# Patient Record
Sex: Female | Born: 1970
Health system: Southern US, Community
[De-identification: ages and names within clinical notes are randomized; demographics above are authoritative.]

## PROBLEM LIST (undated history)

## (undated) ENCOUNTER — Emergency Department (HOSPITAL_BASED_OUTPATIENT_CLINIC_OR_DEPARTMENT_OTHER): Admission: EM | Payer: Medicare Other | Source: Home / Self Care

## (undated) DIAGNOSIS — D696 Thrombocytopenia, unspecified: Secondary | ICD-10-CM

## (undated) DIAGNOSIS — G43909 Migraine, unspecified, not intractable, without status migrainosus: Secondary | ICD-10-CM

## (undated) DIAGNOSIS — M1990 Unspecified osteoarthritis, unspecified site: Secondary | ICD-10-CM

## (undated) DIAGNOSIS — B029 Zoster without complications: Secondary | ICD-10-CM

## (undated) DIAGNOSIS — F32A Depression, unspecified: Secondary | ICD-10-CM

## (undated) DIAGNOSIS — R51 Headache: Secondary | ICD-10-CM

## (undated) DIAGNOSIS — M797 Fibromyalgia: Secondary | ICD-10-CM

## (undated) DIAGNOSIS — I639 Cerebral infarction, unspecified: Secondary | ICD-10-CM

## (undated) DIAGNOSIS — F329 Major depressive disorder, single episode, unspecified: Secondary | ICD-10-CM

## (undated) DIAGNOSIS — I10 Essential (primary) hypertension: Secondary | ICD-10-CM

## (undated) DIAGNOSIS — K219 Gastro-esophageal reflux disease without esophagitis: Secondary | ICD-10-CM

## (undated) DIAGNOSIS — B009 Herpesviral infection, unspecified: Secondary | ICD-10-CM

## (undated) DIAGNOSIS — D849 Immunodeficiency, unspecified: Secondary | ICD-10-CM

## (undated) HISTORY — DX: Fibromyalgia: M79.7

## (undated) HISTORY — PX: ABDOMINAL HYSTERECTOMY: SHX81

## (undated) HISTORY — DX: Unspecified osteoarthritis, unspecified site: M19.90

## (undated) HISTORY — DX: Zoster without complications: B02.9

## (undated) HISTORY — DX: Depression, unspecified: F32.A

## (undated) HISTORY — PX: UPPER GASTROINTESTINAL ENDOSCOPY: SHX188

## (undated) HISTORY — DX: Headache: R51

## (undated) HISTORY — DX: Herpesviral infection, unspecified: B00.9

## (undated) HISTORY — DX: Major depressive disorder, single episode, unspecified: F32.9

## (undated) HISTORY — DX: Thrombocytopenia, unspecified: D69.6

## (undated) HISTORY — DX: Migraine, unspecified, not intractable, without status migrainosus: G43.909

---

## 1993-10-31 HISTORY — PX: WISDOM TOOTH EXTRACTION: SHX21

## 2004-09-14 ENCOUNTER — Emergency Department: Payer: Self-pay | Admitting: Emergency Medicine

## 2004-09-16 ENCOUNTER — Ambulatory Visit: Payer: Self-pay | Admitting: Unknown Physician Specialty

## 2005-01-27 ENCOUNTER — Observation Stay: Payer: Self-pay

## 2005-02-10 ENCOUNTER — Observation Stay: Payer: Self-pay

## 2005-02-24 ENCOUNTER — Inpatient Hospital Stay: Payer: Self-pay

## 2006-12-14 ENCOUNTER — Inpatient Hospital Stay: Payer: Self-pay | Admitting: Unknown Physician Specialty

## 2007-05-24 ENCOUNTER — Ambulatory Visit: Payer: Self-pay | Admitting: Family Medicine

## 2007-05-24 DIAGNOSIS — E282 Polycystic ovarian syndrome: Secondary | ICD-10-CM

## 2007-05-24 DIAGNOSIS — L680 Hirsutism: Secondary | ICD-10-CM

## 2007-05-24 DIAGNOSIS — M129 Arthropathy, unspecified: Secondary | ICD-10-CM

## 2007-06-05 ENCOUNTER — Telehealth (INDEPENDENT_AMBULATORY_CARE_PROVIDER_SITE_OTHER): Payer: Self-pay | Admitting: *Deleted

## 2007-06-05 ENCOUNTER — Encounter: Payer: Self-pay | Admitting: Family Medicine

## 2007-06-13 ENCOUNTER — Encounter: Payer: Self-pay | Admitting: Family Medicine

## 2007-07-09 ENCOUNTER — Encounter: Payer: Self-pay | Admitting: Family Medicine

## 2007-07-11 ENCOUNTER — Encounter: Payer: Self-pay | Admitting: Family Medicine

## 2007-07-11 DIAGNOSIS — E049 Nontoxic goiter, unspecified: Secondary | ICD-10-CM | POA: Insufficient documentation

## 2007-07-25 ENCOUNTER — Ambulatory Visit: Payer: Self-pay | Admitting: Family Medicine

## 2007-07-26 LAB — CONVERTED CEMR LAB
ALT: 12 units/L (ref 0–35)
AST: 16 units/L (ref 0–37)
Bilirubin, Direct: 0.1 mg/dL (ref 0.0–0.3)
CO2: 30 meq/L (ref 19–32)
Calcium: 8.7 mg/dL (ref 8.4–10.5)
Chloride: 109 meq/L (ref 96–112)
Creatinine, Ser: 1 mg/dL (ref 0.4–1.2)
Glucose, Bld: 108 mg/dL — ABNORMAL HIGH (ref 70–99)
LDL Cholesterol: 54 mg/dL (ref 0–99)
Sodium: 142 meq/L (ref 135–145)
TSH: 0.61 microintl units/mL (ref 0.35–5.50)
Total Bilirubin: 0.6 mg/dL (ref 0.3–1.2)
Total CHOL/HDL Ratio: 2.5
Total Protein: 6.6 g/dL (ref 6.0–8.3)
Triglycerides: 31 mg/dL (ref 0–149)
VLDL: 6 mg/dL (ref 0–40)

## 2007-08-01 ENCOUNTER — Ambulatory Visit: Payer: Self-pay | Admitting: Family Medicine

## 2007-08-01 DIAGNOSIS — R4589 Other symptoms and signs involving emotional state: Secondary | ICD-10-CM

## 2007-08-01 DIAGNOSIS — I10 Essential (primary) hypertension: Secondary | ICD-10-CM | POA: Insufficient documentation

## 2007-08-01 DIAGNOSIS — R7309 Other abnormal glucose: Secondary | ICD-10-CM

## 2007-08-10 ENCOUNTER — Encounter: Payer: Self-pay | Admitting: Family Medicine

## 2007-08-20 ENCOUNTER — Encounter: Payer: Self-pay | Admitting: Family Medicine

## 2007-10-08 ENCOUNTER — Encounter: Payer: Self-pay | Admitting: Family Medicine

## 2007-10-16 ENCOUNTER — Ambulatory Visit: Payer: Self-pay | Admitting: Family Medicine

## 2007-10-16 DIAGNOSIS — M545 Low back pain: Secondary | ICD-10-CM | POA: Insufficient documentation

## 2007-11-26 ENCOUNTER — Ambulatory Visit: Payer: Self-pay | Admitting: Family Medicine

## 2007-11-26 LAB — CONVERTED CEMR LAB
Glucose, Urine, Semiquant: NEGATIVE
Ketones, urine, test strip: NEGATIVE
Nitrite: NEGATIVE
pH: 6

## 2007-11-27 ENCOUNTER — Encounter: Payer: Self-pay | Admitting: Family Medicine

## 2007-12-10 ENCOUNTER — Ambulatory Visit: Payer: Self-pay | Admitting: Family Medicine

## 2007-12-10 LAB — CONVERTED CEMR LAB
Ketones, urine, test strip: NEGATIVE
Protein, U semiquant: 30
Urobilinogen, UA: NEGATIVE

## 2007-12-12 ENCOUNTER — Encounter: Payer: Self-pay | Admitting: Family Medicine

## 2008-01-08 ENCOUNTER — Emergency Department: Payer: Self-pay | Admitting: Emergency Medicine

## 2008-01-17 ENCOUNTER — Ambulatory Visit: Payer: Self-pay

## 2008-01-25 ENCOUNTER — Ambulatory Visit: Payer: Self-pay | Admitting: Family Medicine

## 2008-01-25 DIAGNOSIS — R61 Generalized hyperhidrosis: Secondary | ICD-10-CM | POA: Insufficient documentation

## 2008-02-22 ENCOUNTER — Encounter: Payer: Self-pay | Admitting: Family Medicine

## 2008-02-28 ENCOUNTER — Telehealth: Payer: Self-pay | Admitting: Family Medicine

## 2008-06-26 ENCOUNTER — Telehealth (INDEPENDENT_AMBULATORY_CARE_PROVIDER_SITE_OTHER): Payer: Self-pay | Admitting: *Deleted

## 2008-06-26 ENCOUNTER — Emergency Department (HOSPITAL_COMMUNITY): Admission: EM | Admit: 2008-06-26 | Discharge: 2008-06-26 | Payer: Self-pay | Admitting: Emergency Medicine

## 2008-06-26 ENCOUNTER — Encounter: Payer: Self-pay | Admitting: Family Medicine

## 2008-06-30 ENCOUNTER — Ambulatory Visit: Payer: Self-pay | Admitting: Family Medicine

## 2008-06-30 DIAGNOSIS — F4323 Adjustment disorder with mixed anxiety and depressed mood: Secondary | ICD-10-CM

## 2008-07-16 ENCOUNTER — Encounter (INDEPENDENT_AMBULATORY_CARE_PROVIDER_SITE_OTHER): Payer: Self-pay | Admitting: *Deleted

## 2008-08-19 ENCOUNTER — Ambulatory Visit: Payer: Self-pay | Admitting: Family Medicine

## 2008-08-27 ENCOUNTER — Encounter (INDEPENDENT_AMBULATORY_CARE_PROVIDER_SITE_OTHER): Payer: Self-pay | Admitting: Internal Medicine

## 2008-08-29 ENCOUNTER — Ambulatory Visit: Payer: Self-pay | Admitting: Family Medicine

## 2008-12-01 ENCOUNTER — Telehealth: Payer: Self-pay | Admitting: Family Medicine

## 2008-12-02 ENCOUNTER — Telehealth: Payer: Self-pay | Admitting: Family Medicine

## 2008-12-02 ENCOUNTER — Ambulatory Visit: Payer: Self-pay | Admitting: Family Medicine

## 2008-12-04 ENCOUNTER — Encounter: Admission: RE | Admit: 2008-12-04 | Discharge: 2008-12-04 | Payer: Self-pay | Admitting: Family Medicine

## 2008-12-08 ENCOUNTER — Telehealth: Payer: Self-pay | Admitting: Family Medicine

## 2008-12-08 ENCOUNTER — Encounter: Payer: Self-pay | Admitting: Family Medicine

## 2008-12-08 ENCOUNTER — Encounter (INDEPENDENT_AMBULATORY_CARE_PROVIDER_SITE_OTHER): Payer: Self-pay | Admitting: *Deleted

## 2008-12-09 ENCOUNTER — Telehealth (INDEPENDENT_AMBULATORY_CARE_PROVIDER_SITE_OTHER): Payer: Self-pay | Admitting: *Deleted

## 2009-02-24 ENCOUNTER — Telehealth: Payer: Self-pay | Admitting: Family Medicine

## 2009-04-29 ENCOUNTER — Emergency Department: Payer: Self-pay | Admitting: Emergency Medicine

## 2009-07-13 ENCOUNTER — Telehealth: Payer: Self-pay | Admitting: Family Medicine

## 2009-10-27 ENCOUNTER — Ambulatory Visit: Payer: Self-pay | Admitting: Family Medicine

## 2009-10-28 ENCOUNTER — Ambulatory Visit: Payer: Self-pay | Admitting: Family Medicine

## 2010-07-04 ENCOUNTER — Emergency Department (HOSPITAL_COMMUNITY): Admission: EM | Admit: 2010-07-04 | Discharge: 2010-07-04 | Payer: Self-pay | Admitting: Emergency Medicine

## 2010-10-07 ENCOUNTER — Telehealth: Payer: Self-pay | Admitting: Internal Medicine

## 2010-10-07 ENCOUNTER — Ambulatory Visit: Payer: Self-pay | Admitting: Internal Medicine

## 2010-10-08 ENCOUNTER — Encounter: Payer: Self-pay | Admitting: Internal Medicine

## 2010-10-31 HISTORY — PX: PARTIAL HYSTERECTOMY: SHX80

## 2010-11-30 NOTE — Assessment & Plan Note (Signed)
Summary: LARYNGITIS/ 2:00   lb   Vital Signs:  Patient profile:   40 year old female Weight:      250 pounds BMI:     45.89 Temp:     98.7 degrees F oral Pulse rate:   76 / minute Pulse rhythm:   regular Resp:     16 per minute BP sitting:   120 / 80  (left arm) Cuff size:   large  Vitals Entered By: Mervin Hack CMA Duncan Dull) (October 07, 2010 2:16 PM) CC: sore throat, headache, can't talk   History of Present Illness: "I feel terrible" Headache, nasal congestion and discharge some better yesterday than earilier in the week Throat pain--largely from PND Chest pain from coughing---mostly productive at night (thick yellow green mucus) Voice gone since yesterday  Fever only at night has had chills some queasiness in stomach Not really SOB  Allergies: No Known Drug Allergies  Past History:  Past medical, surgical, family and social histories (including risk factors) reviewed for relevance to current acute and chronic problems.  Past Medical History: Reviewed history from 05/24/2007 and no changes required. 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 C-S miscarriage  Family History: Reviewed history from 05/24/2007 and no changes required. OA in family- no known rheum father with DM,  ? PCOS in family P aunts with breast ca GMP colon cancer grandparents, aunts uncles P- CVA  Social History: Reviewed history from 10/27/2009 and no changes required. has had some college works in collections works at [employer] is married 3 kids (9,2,87mo) non smoker on leave from work for depresion 12/10  Review of Systems       No diarrhea appetite is off  Physical Exam  General:  alert.  NAD but voice is hoarse Head:  no frontal or maxillary tenderness but does have occipital tenderness Ears:  R ear normal and L ear normal.   Nose:  marked inflammation and thick green mucus  in right nare Mouth:  no erythema and no exudates.   Neck:  supple, no masses, and no cervical lymphadenopathy.   Lungs:  normal respiratory effort, no intercostal retractions, and no accessory muscle use.  Muffled breath sounds at bases but clear   Impression & Recommendations:  Problem # 1:  SINUSITIS - ACUTE-NOS (ICD-461.9) Assessment New  seems to have bacterial infection with night fevers and chills and purulent mucus duration  ~4 days though  will try analgesic amoxicillin  Her updated medication list for this problem includes:    Amoxicillin 500 Mg Tabs (Amoxicillin) .Marland Kitchen... 2 tabs by mouth two times a day for sinus infection  Complete Medication List: 1)  Mobic 15 Mg Tabs (Meloxicam) .... Take 1 by mouth once daily as needed 2)  Amoxicillin 500 Mg Tabs (Amoxicillin) .... 2 tabs by mouth two times a day for sinus infection  Patient Instructions: 1)  Please schedule a follow-up appointment as needed .  2)  Please call Monday if not really feeling considerably better Prescriptions: AMOXICILLIN 500 MG TABS (AMOXICILLIN) 2 tabs by mouth two times a day for sinus infection  #40 x 0   Entered and Authorized by:   Cindee Salt MD   Signed by:   Cindee Salt MD on 10/07/2010   Method used:   Electronically to        [facility]* (retail)       [address]  [place], [place]  [address]       Ph: [phone]       Fax: [phone]   RxID:   [id number]    Orders Added: 1)  Est. Patient Level III [44010]    Current Allergies (reviewed today): No known allergies

## 2010-11-30 NOTE — Letter (Signed)
Summary: Out of Work  [facility]  [address] [place], [place] [address]   Phone: [phone]  Fax: [phone]    October 08, 2010   Employee:  Carmina Miller    To Whom It May Concern:   For Medical reasons, please excuse the above named employee from work for the following dates:  Start:   10/07/2010  End:   10/11/2010  If you need additional information, please feel free to contact our office.         Sincerely,      Tillman Abide, MD

## 2010-11-30 NOTE — Progress Notes (Signed)
Summary: needs note for work  Phone Note Call from Patient Call back at Work Phone [phone]   Caller: Patient Call For: Dr. Alphonsus Sias Summary of Call: Pt just left after being seen.  She is asking if she should work tomorrow or stay home.  If she shouldnt work she needs to come by and pickup a note. Initial call taken by: Lowella Petties CMA, AAMA,  October 07, 2010 3:01 PM  Follow-up for Phone Call        I generally recommend going to work unless you really feel bad (esp with a new job) If she is not up to it, okay to give excuse for tomorrow and she can pick  it up then Follow-up by: Cindee Salt MD,  October 07, 2010 5:23 PM  Additional Follow-up for Phone Call Additional follow up Details #1::        Spoke with patient and advised results. She will let us know how it goes in the morning.  Additional Follow-up by: Mervin Hack CMA Duncan Dull),  October 07, 2010 5:40 PM

## 2011-01-03 ENCOUNTER — Encounter: Payer: Self-pay | Admitting: Family Medicine

## 2011-01-03 ENCOUNTER — Ambulatory Visit (INDEPENDENT_AMBULATORY_CARE_PROVIDER_SITE_OTHER): Payer: 59 | Admitting: Family Medicine

## 2011-01-03 DIAGNOSIS — R05 Cough: Secondary | ICD-10-CM

## 2011-01-11 NOTE — Letter (Signed)
Summary: Out of Work  [facility] at [facility]  [address] [place], [place] [address]   Phone: [phone]  Fax: [phone]    January 03, 2011   Employee:  Carmina Miller    To Whom It May Concern:   For Medical reasons, please excuse the above named employee from work for the following dates:  Start:   today  End:   tomorrow, go back to work on 01/05/11  If you need additional information, please feel free to contact our office.         Sincerely,    Crawford Givens MD

## 2011-01-11 NOTE — Assessment & Plan Note (Signed)
Summary: cough and congestion jrt   Vital Signs:  Patient profile:   40 year old female Height:      62 inches Weight:      240.25 pounds BMI:     44.10 O2 Sat:      97 % on Room air Temp:     98.6 degrees F oral Pulse rate:   74 / minute Pulse rhythm:   regular Resp:     16 per minute BP sitting:   128 / 76  (left arm) Cuff size:   large  Vitals Entered By: Delilah Shan CMA (AAMA) (January 03, 2011 11:00 AM)  O2 Flow:  Room air CC: Cough and congestion   History of Present Illness: Cough for about 1-2 weeks.  Woke up Saturday with pain on R side of face.  She thought it was some tender lymph nodes.  She took some left over amoxil (6 pills).  cough with deep breath.  cough leads to HA, stress incontinence.  She is doing kegels already.  No FNAV.  Occ chills.  Occ wheeze.  No asthma.  Smokes occ.  ST resolved.   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  Otherwise negative.    Physical Exam  General:  GEN: nad, alert and oriented HEENT: mucous membranes moist, TM w/o erythema, nasal epithelium injected, OP with cobblestoning, no erythema NECK: supple w/o LA CV: rrr. PULM: ctab, no inc wob, cough noted ABD: soft, +bs EXT: no edema    Impression & Recommendations:  Problem # 1:  COUGH (ICD-786.2) Strep unlikely with cough, no fever, no LA today and no exudated.  ST is minimal now.  I doubt the amoxil was of any benefit.  Use SABA for likely viral process and follow up as needed.  D/w patient about smoking. She agrees.   Orders: Prescription Created Electronically [phone])  Complete Medication List: 1)  Mobic 15 Mg Tabs (Meloxicam) .... Take 1 by mouth once daily as needed 2)  Proair Hfa 108 (90 Base) Mcg/act Aers (Albuterol sulfate) .... 2 puffs every 4 hours as needed for  cough  Patient Instructions: 1)  Get plenty of rest, drink lots of clear liquids, and use Tylenol or Ibuprofen for fever and comfort. Use the inhaler as needed.  This should gradually improve.  Take care.  Prescriptions: PROAIR HFA 108 (90 BASE) MCG/ACT AERS (ALBUTEROL SULFATE) 2 puffs every 4 hours as needed for cough  #1 x 1   Entered and Authorized by:   Crawford Givens MD   Signed by:   Crawford Givens MD on 01/03/2011   Method used:   Electronically to        [facility]* (retail)       [address]       [place], [place]  [address]       Ph: [phone]       Fax: [phone]   RxID:   [id number]    Orders Added: 1)  Prescription Created Electronically [G8553] 2)  Est. Patient Level III [44010]    Current Allergies (reviewed today): No known allergies

## 2011-01-12 ENCOUNTER — Telehealth: Payer: Self-pay | Admitting: Family Medicine

## 2011-01-18 NOTE — Progress Notes (Signed)
Summary: cough, wheezing  Phone Note Call from Patient Call back at Work Phone [phone]   Caller: Patient Call For: Judith Part MD Summary of Call: Patient says that she is still having trouble coughing, loses her breath when she has the coughing fits, is having some wheezing, it is worse at night while lying down. She says that the proair is not helping and is asking to try something else. Uses [facility].  Initial call taken by: Melody Comas,  January 12, 2011 1:49 PM  Follow-up for Phone Call        please call in the hycodan.  sedation caution.  if she isn't improved with that, then she needs follow up.  Crawford Givens MD  January 12, 2011 2:02 PM   Additional Follow-up for Phone Call Additional follow up Details #1::        Patient Advised. Medication phoned to pharmacy.  Additional Follow-up by: Delilah Shan CMA (AAMA),  January 12, 2011 3:00 PM    New/Updated Medications: HYDROCODONE-HOMATROPINE 5-1.5 MG/5ML SYRP (HYDROCODONE-HOMATROPINE) 5ml by mouth at bedtime as needed for cough, sedation caution Prescriptions: HYDROCODONE-HOMATROPINE 5-1.5 MG/5ML SYRP (HYDROCODONE-HOMATROPINE) 5ml by mouth at bedtime as needed for cough, sedation caution  #6oz x 0   Entered and Authorized by:   Crawford Givens MD   Signed by:   Crawford Givens MD on 01/12/2011   Method used:   Telephoned to ...       [facility]* (retail)       [address]       [place], [place]  [address]       Ph: [phone]       Fax: [phone]   RxID:   [id number]

## 2011-03-08 ENCOUNTER — Emergency Department (HOSPITAL_COMMUNITY)
Admission: EM | Admit: 2011-03-08 | Discharge: 2011-03-08 | Disposition: A | Discharge status: Home / Self Care | Payer: 59 | Attending: Emergency Medicine | Admitting: Emergency Medicine

## 2011-03-08 ENCOUNTER — Emergency Department (HOSPITAL_COMMUNITY): Payer: 59

## 2011-03-08 DIAGNOSIS — R079 Chest pain, unspecified: Secondary | ICD-10-CM | POA: Insufficient documentation

## 2011-03-08 DIAGNOSIS — R109 Unspecified abdominal pain: Secondary | ICD-10-CM | POA: Insufficient documentation

## 2011-03-08 DIAGNOSIS — R630 Anorexia: Secondary | ICD-10-CM | POA: Insufficient documentation

## 2011-03-08 DIAGNOSIS — R319 Hematuria, unspecified: Secondary | ICD-10-CM | POA: Insufficient documentation

## 2011-03-08 DIAGNOSIS — R12 Heartburn: Secondary | ICD-10-CM | POA: Insufficient documentation

## 2011-03-08 DIAGNOSIS — M129 Arthropathy, unspecified: Secondary | ICD-10-CM | POA: Insufficient documentation

## 2011-03-08 DIAGNOSIS — R110 Nausea: Secondary | ICD-10-CM | POA: Insufficient documentation

## 2011-03-08 LAB — CBC
Hemoglobin: 13.8 g/dL (ref 12.0–15.0)
MCH: 31.1 pg (ref 26.0–34.0)
MCV: 91.4 fL (ref 78.0–100.0)
RBC: 4.44 MIL/uL (ref 3.87–5.11)

## 2011-03-08 LAB — DIFFERENTIAL
Lymphs Abs: 2.3 10*3/uL (ref 0.7–4.0)
Monocytes Relative: 5 % (ref 3–12)
Neutro Abs: 4.1 10*3/uL (ref 1.7–7.7)
Neutrophils Relative %: 58 % (ref 43–77)

## 2011-03-08 LAB — COMPREHENSIVE METABOLIC PANEL
BUN: 14 mg/dL (ref 6–23)
CO2: 28 mEq/L (ref 19–32)
Chloride: 105 mEq/L (ref 96–112)
Creatinine, Ser: 1.01 mg/dL (ref 0.4–1.2)
GFR calc non Af Amer: >60 mL/min (ref >60)
Total Bilirubin: 0.2 mg/dL — ABNORMAL LOW (ref 0.3–1.2)

## 2011-03-08 LAB — URINALYSIS, ROUTINE W REFLEX MICROSCOPIC
Protein, ur: NEGATIVE mg/dL
Urobilinogen, UA: 1 mg/dL (ref 0.0–1.0)

## 2011-03-08 LAB — URINE MICROSCOPIC-ADD ON

## 2011-05-26 ENCOUNTER — Ambulatory Visit: Payer: Self-pay

## 2011-06-09 ENCOUNTER — Ambulatory Visit: Payer: Self-pay

## 2011-06-13 LAB — PATHOLOGY REPORT

## 2011-08-11 ENCOUNTER — Ambulatory Visit (INDEPENDENT_AMBULATORY_CARE_PROVIDER_SITE_OTHER): Payer: 59 | Admitting: Family Medicine

## 2011-08-11 ENCOUNTER — Encounter: Payer: Self-pay | Admitting: Family Medicine

## 2011-08-11 VITALS — BP 118/82 | HR 76 | Temp 98.6°F | Wt 233.8 lb

## 2011-08-11 DIAGNOSIS — H1045 Other chronic allergic conjunctivitis: Secondary | ICD-10-CM

## 2011-08-11 DIAGNOSIS — H1010 Acute atopic conjunctivitis, unspecified eye: Secondary | ICD-10-CM | POA: Insufficient documentation

## 2011-08-11 MED ORDER — OLOPATADINE HCL 0.2 % OP SOLN: 1.0000 [drp] | Freq: Two times a day (BID) | OPHTHALMIC | Status: DC

## 2011-08-11 NOTE — Progress Notes (Signed)
  Subjective:    Patient ID: Dana Greer, female    DOB: 05-31-1971, 40 y.o.   MRN: [id number]  HPI  40 yo female pt of Dr. Milinda Antis here for swollen, itchy, watery eyes for several weeks. Does have a h/o seasonal allergies in spring, not usually this time of year.    Tried some erythromycin ointment she had at home, made burning worse.  Some sensitivity to light, mild photophobia. No eye pain.   Had URI symptoms for almost two months, self treated with Mucinex, those are improving.    Patient Active Problem List  Diagnoses  . GOITER NOS  . POLYCYSTIC OVARIAN DISEASE  . REACTION, ACUTE STRESS W/EMOTIONAL DSTURB  . ADJ DISORDER WITH MIXED ANXIETY & DEPRESSED MOOD  . HIRSUTISM  . ARTHROPATHY NOS, MULTIPLE SITES  . BACK PAIN, LUMBAR, CHRONIC  . HYPERHIDROSIS  . HYPERGLYCEMIA  . ELEVATED BLOOD PRESSURE WITHOUT DIAGNOSIS OF HYPERTENSION   Past Medical History  Diagnosis Date  . Arthritis   . Depression   . Headache   . Western blot positive HSV2    Past Surgical History  Procedure Date  . Cesarean section    History  Substance Use Topics  . Smoking status: Never Smoker   . Smokeless tobacco: Not on file  . Alcohol Use:    Family History  Problem Relation Age of Onset  . Diabetes Father   . Cancer Paternal Aunt     breast   No Known Allergies No current outpatient prescriptions on file prior to visit.   The PMH, PSH, Social History, Family History, Medications, and allergies have been reviewed in [facility], and have been updated if relevant.  Review of Systems See HPI    Objective:   Physical Exam BP 118/82  Pulse 76  Temp(Src) 98.6 F (37 C) (Oral)  Wt 233 lb 12 oz (106.028 kg)  General:  Well-developed,well-nourished,in no acute distress; alert,appropriate and cooperative throughout examination Head:  normocephalic and atraumatic.   Eyes: mild conjunctival injection, PERRL, +photophobia vision grossly intact, pupils equal, pupils round, and pupils reactive to  light.   Ears:  R ear normal and L ear normal.   Nose:  no external deformity.   Mouth:  good dentition.   Lungs:  Normal respiratory effort, chest expands symmetrically. Lungs are clear to auscultation, no crackles or wheezes. Heart:  Normal rate and regular rhythm. S1 and S2 normal without gallop, murmur, click, rub or other extra sounds. Msk:  No deformity or scoliosis noted of thoracic or lumbar spine.   Extremities:  No clubbing, cyanosis, edema, or deformity noted with normal full range of motion of all joints.   Neurologic:  alert & oriented X3 and gait normal.   Skin:  Intact without suspicious lesions or rashes Cervical Nodes:  No lymphadenopathy noted Psych:  Cognition and judgment appear intact. Alert and cooperative with normal attention span and concentration. No apparent delusions, illusions, hallucinations        Assessment & Plan:   1. Allergic conjunctivitis    New.   Will treat with Pataday- supportive care as per pt instructions. If no improvement in 2-3 days, needs to see optho.  Pt in agreement with plan.

## 2011-08-11 NOTE — Patient Instructions (Signed)
Allergic Conjunctivitis The conjunctiva is a thin membrane that covers the visible white part of the eyeball and the underside of the eyelids. This membrane protects and lubricates the eye. The membrane has small blood vessels running through it that can normally be seen. When the conjunctiva becomes inflamed, the condition is called conjunctivitis. In response to the inflammation, the conjunctival blood vessels become swollen. The swelling results in redness in the normally white part of the eye. The blood vessels of this membrane also react when a person has allergies and is then called allergic conjunctivitis. This condition usually lasts for as long as the allergy persists. Allergic conjunctivitis cannot be passed to another person (non-contagious). The likelihood of bacterial infection is great and the cause is not likely due to allergies if the inflamed eye has:  A sticky discharge.  Discharge or sticking together of the lids in the morning.   Scaling or flaking of the eyelids where the eyelashes come out.   Red swollen eyelids.   CAUSES  Germs (bacteria).   Viruses.   Irritants such as foreign bodies.   Blunt injury.   Chemicals.   General allergic reactions.   Inflammation or serious diseases in the inside or the outside of the eye or the orbit (the boney cavity in which the eye sits) can cause a "red eye."  SYMPTOMS  Eye redness.   Tearing.   Itchy eyes.   Burning feeling in the eyes.   Clear drainage from the eye.   Allergic reaction due to pollens or ragweed sensitivity. Seasonal allergic conjunctivitis is frequent in the spring when pollens are in the air and in the fall.  DIAGNOSIS This condition, in its many forms, is usually diagnosed based on the history and an ophthalmological exam. It usually involves both eyes. If your eyes react at the same time every year, allergies may be the cause. While most "red eyes" are due to allergy or an infection, the role of an  eye (ophthalmological) exam is important. The exam can rule out serious diseases of the eye or orbit. TREATMENT  Non-antibiotic eye drops, ointments, or medications by mouth may be prescribed if the ophthalmologist is sure the conjunctivitis is due to allergies alone.   Over-the-counter drops and ointments for allergic symptoms should be used only after other causes of conjunctivitis have been ruled out, or as your caregiver suggests.  Medications by mouth are often prescribed if other allergy-related symptoms are present. If the ophthalmologist is sure that the conjunctivitis is due to allergies alone, treatment is normally limited to drops or ointments to reduce itching and burning. HOME CARE INSTRUCTIONS  Wash hands before and after applying drops or ointments, or touching the inflamed eye(s) or eyelids.   Stop using your soft contact lenses and throw them away. Use a new pair of lenses when recovery is complete. You should run through sterilizing cycles at least three times before use after complete recovery if the old soft contact lenses are to be used. Hard contact lenses should be stopped. They need to be thoroughly sterilized before use after recovery.   Do not let the eye dropper tip or ointment tube touch the eyelid when putting medicine in your eye.   Itching and burning eyes due to allergies is often relieved by using a cool cloth applied to closed eye(s).  SEEK MEDICAL CARE IF:   Your problems do not go away after two or three days of treatment.   Your lids are sticky (especially in  the morning when you wake up) or stick together.   Discharge develops. Antibiotics may be needed either as drops, ointment, or by mouth.   You have extreme light sensitivity.   An oral temperature above 100.5 develops.   Pain in or around the eye or any other visual symptom develops.  MAKE SURE YOU:   Understand these instructions.   Will watch your condition.   Will get help right away if  you are not doing well or get worse.  Document Released: 01/07/2003 Document Re-Released: 04/06/2010 [facility] Patient Information 2011 [place], [place].

## 2011-08-15 ENCOUNTER — Ambulatory Visit: Payer: 59 | Admitting: Family Medicine

## 2011-08-19 ENCOUNTER — Encounter: Payer: Self-pay | Admitting: Family Medicine

## 2011-08-22 ENCOUNTER — Ambulatory Visit: Payer: 59 | Admitting: Family Medicine

## 2011-10-14 ENCOUNTER — Emergency Department: Payer: Self-pay | Admitting: Unknown Physician Specialty

## 2011-10-31 ENCOUNTER — Ambulatory Visit (INDEPENDENT_AMBULATORY_CARE_PROVIDER_SITE_OTHER): Payer: 59 | Admitting: Family Medicine

## 2011-10-31 ENCOUNTER — Encounter: Payer: Self-pay | Admitting: Family Medicine

## 2011-10-31 VITALS — BP 114/78 | HR 68 | Temp 98.5°F | Wt 226.5 lb

## 2011-10-31 DIAGNOSIS — J329 Chronic sinusitis, unspecified: Secondary | ICD-10-CM

## 2011-10-31 MED ORDER — AMOXICILLIN-POT CLAVULANATE 875-125 MG PO TABS: 1.0000 | ORAL_TABLET | Freq: Two times a day (BID) | ORAL | Status: AC

## 2011-10-31 NOTE — Assessment & Plan Note (Signed)
Acute after uri with sinus pain and purulent drainage  Will cover with augmentin Disc symptomatic care - see instructions on AVS

## 2011-10-31 NOTE — Patient Instructions (Signed)
Drink lots of fluids Use warm compress on face  Nasal saline spray or netti pot is helpful  advil cold and sinus is ok if not taking mobic  Take the augmentin as directed  Good luck with your surgery !

## 2011-10-31 NOTE — Progress Notes (Signed)
Subjective:    Patient ID: Dana Greer, female    DOB: 09/26/1971, 40 y.o.   MRN: [id number]  HPI Here for uri  Started Thursday night - started with a sort throat with post nasal drip Friday - chills/ hot and cold and aches  Head feels heavy  Really tired -- and no temp at all Did not have the flu shot   Is having severe sinus pain around the eyes Ears hurt too  otc has tried advil cold and sinus/ tylenol sinus / and nyquil   Cough is mild   Has hyst planned on jan 10th   Needs work note- to go back on wed  Patient Active Problem List  Diagnoses  . GOITER NOS  . POLYCYSTIC OVARIAN DISEASE  . REACTION, ACUTE STRESS W/EMOTIONAL DSTURB  . ADJ DISORDER WITH MIXED ANXIETY & DEPRESSED MOOD  . HIRSUTISM  . ARTHROPATHY NOS, MULTIPLE SITES  . BACK PAIN, LUMBAR, CHRONIC  . HYPERHIDROSIS  . HYPERGLYCEMIA  . ELEVATED BLOOD PRESSURE WITHOUT DIAGNOSIS OF HYPERTENSION  . Allergic conjunctivitis  . Sinusitis   Past Medical History  Diagnosis Date  . Arthritis   . Depression   . Headache   . Western blot positive HSV2    Past Surgical History  Procedure Date  . Cesarean section    History  Substance Use Topics  . Smoking status: Current Some Day Smoker  . Smokeless tobacco: Never Used  . Alcohol Use: Yes   Family History  Problem Relation Age of Onset  . Diabetes Father   . Cancer Paternal Aunt     breast  . Cancer Paternal Grandmother     colon CA   No Known Allergies Current Outpatient Prescriptions on File Prior to Visit  Medication Sig Dispense Refill  . ALPRAZolam (XANAX) 1 MG tablet Take 1 mg by mouth 4 (four) times daily.        . meloxicam (MOBIC) 15 MG tablet Take 15 mg by mouth daily.        Marland Kitchen albuterol (PROVENTIL HFA;VENTOLIN HFA) 108 (90 BASE) MCG/ACT inhaler Inhale 2 puffs into the lungs every 4 (four) hours as needed.        . Olopatadine HCl 0.2 % SOLN Apply 1 drop to eye 2 (two) times daily.  1 Bottle  0         Review of Systems Review  of Systems  Constitutional: Negative for fever, appetite change, fatigue and unexpected weight change.  Eyes: Negative for pain and visual disturbance.  ENT neg for st, pos for facial pain and purulent nasal drainage  Respiratory: Negative for sob or wheeze  Cardiovascular: Negative for cp or palpitations    Gastrointestinal: Negative for nausea, diarrhea and constipation.  Genitourinary: Negative for urgency and frequency.  Skin: Negative for pallor or rash   Neurological: Negative for weakness, light-headedness, numbness and headaches.  Hematological: Negative for adenopathy. Does not bruise/bleed easily.  Psychiatric/Behavioral: Negative for dysphoric mood. The patient is not nervous/anxious.          Objective:   Physical Exam  Constitutional: She appears well-developed and well-nourished. No distress.       overwt and well appearing   HENT:  Head: Normocephalic and atraumatic.  Right Ear: External ear normal.  Left Ear: External ear normal.       Nares are injected and congested  Post nasal drainage -yellow  Marked sinus tenderness in all areas   Eyes: Conjunctivae and EOM are normal. Pupils are equal, round,  and reactive to light. Right eye exhibits no discharge. Left eye exhibits no discharge.  Neck: Normal range of motion. Neck supple. No JVD present. Carotid bruit is not present. No thyromegaly present.  Cardiovascular: Normal rate, regular rhythm and normal heart sounds.   Pulmonary/Chest: Effort normal and breath sounds normal. No respiratory distress. She has no wheezes.  Lymphadenopathy:    She has no cervical adenopathy.  Skin: Skin is warm and dry. No rash noted. No erythema. No pallor.  Psychiatric: She has a normal mood and affect.          Assessment & Plan:

## 2011-11-15 ENCOUNTER — Ambulatory Visit: Payer: Self-pay

## 2011-11-15 LAB — HEMATOCRIT: HCT: 40.7 % (ref 35.0–47.0)

## 2011-11-15 LAB — PREGNANCY, URINE: Pregnancy Test, Urine: NEGATIVE m[IU]/mL

## 2011-11-22 ENCOUNTER — Inpatient Hospital Stay: Payer: Self-pay | Admitting: Obstetrics and Gynecology

## 2011-11-24 LAB — URINE CULTURE

## 2011-12-19 ENCOUNTER — Telehealth: Payer: Self-pay | Admitting: Family Medicine

## 2011-12-19 MED ORDER — AMOXICILLIN 500 MG PO CAPS: 500.0000 mg | ORAL_CAPSULE | Freq: Three times a day (TID) | ORAL | Status: DC

## 2011-12-19 MED ORDER — AZITHROMYCIN 250 MG PO TABS: ORAL_TABLET | ORAL | Status: AC

## 2011-12-19 NOTE — Telephone Encounter (Signed)
If she had close exp to strep can treat her over the phone Px written for call in  For amox Please f/u if not improved or if worse

## 2011-12-19 NOTE — Telephone Encounter (Signed)
Warn her all abx have the potential for yeast - I do not know of one that poses more risk than another However, if she insists will change to zpak Will refill electronically

## 2011-12-19 NOTE — Telephone Encounter (Signed)
Patient notified as instructed by telephone. 

## 2011-12-19 NOTE — Telephone Encounter (Signed)
Patient notified as instructed by telephone. Pt said she appreciates Dr Milinda Antis calling in med but amoxicillin gives her terrible yeast infection and wonders if Dr Milinda Antis would consider another antibiotic. Pt said she will take amoxicillin if Dr Milinda Antis cannot substitute. Pt wants med called to [facility] and will ck with them tomorrow for prescription.

## 2011-12-19 NOTE — Telephone Encounter (Signed)
Triage Record Num: [id number] Operator: Tomasita Crumble Patient Name: Dana Greer Call Date & Time: 12/19/2011 1:44:57PM Patient Phone: [phone] PCP: Audrie Gallus. [name] Patient Gender: Female PCP Fax : Patient DOB: 1970/12/04 Practice Name: [facility] Reason for Call: Caller: Fayetta/Patient; PCP: Roxy Manns A.; CB#: [phone]; Call regarding Sore Throat and Headache onset 12/19/11. Caller states her child was seen for fever on . 12/16/11 with similar sx. She has just learned that child has strep. Advised see in 24 hours per Sore Throat protocol. Jethro Bolus asks if Rx can be called in for her as she is s/p Hysterectomy 11/23/11. Home care measures and parameters for callback given. OFFICE, PT ASKS IF PCP WOULD CONSIDER CALLING IN RX FOR HER. THANK YOU. CALLBACK NUMBER IS ABOVE; MAY LEAVE MESSAGE IF NO ANSWER. THANK YOU. Protocol(s) Used: Sore Throat or Hoarseness Recommended Outcome per Protocol: See Provider within 24 hours Reason for Outcome: Exposure to an individual with diagnosed strep throat but who has not completed 24 hours of antibiotic therapy Care Advice: ~ Eat a balanced diet and follow a regular sleep schedule with adequate sleep, about 7 to 8 hours a night. Most adults need to drink 6-10 eight-ounce glasses (1.2-2.0 liters) of fluids per day unless previously told to limit fluid intake for other medical reasons. Limit fluids that contain caffeine, sugar or alcohol. Urine will be a very light yellow color when you drink enough fluids. ~ Analgesic/Antipyretic Advice - Acetaminophen: Consider acetaminophen as directed on label or by pharmacist/provider for pain or fever PRECAUTIONS: - Use if there is no history of liver disease, alcoholism, or intake of three or more alcohol drinks per day - Only if approved by provider during pregnancy or when breastfeeding - During pregnancy, acetaminophen should not be taken more than 3 consecutive days without telling  provider - Do not exceed recommended dose or frequency ~ Sore Throat Relief: - Use warm salt water gargles 3 to 4 times/day, as needed (1/2 tsp. salt in 8 oz. [.2 liters] water). - Suck on hard candy, nonprescription or herbal throat lozenges (sugar-free if diabetic) - Eat soothing, soft food/fluids (broths, soups, or honey and lemon juice in hot tea, Popsicles, frozen yogurt or sherbet, scrambled eggs, cooked cereals, Jell-O or puddings) whichever is most comforting. - Avoid eating salty, spicy or acidic foods. ~ 12/19/2011 1:55:48PM Page 1 of 1 [id number]

## 2012-05-11 ENCOUNTER — Ambulatory Visit (INDEPENDENT_AMBULATORY_CARE_PROVIDER_SITE_OTHER): Payer: 59 | Admitting: Family Medicine

## 2012-05-11 ENCOUNTER — Encounter: Payer: Self-pay | Admitting: Family Medicine

## 2012-05-11 VITALS — BP 122/70 | HR 65 | Temp 98.4°F | Ht 62.0 in | Wt 211.0 lb

## 2012-05-11 DIAGNOSIS — N8111 Cystocele, midline: Secondary | ICD-10-CM

## 2012-05-11 DIAGNOSIS — N8110 Cystocele, unspecified: Secondary | ICD-10-CM | POA: Insufficient documentation

## 2012-05-11 DIAGNOSIS — M1990 Unspecified osteoarthritis, unspecified site: Secondary | ICD-10-CM

## 2012-05-11 DIAGNOSIS — E876 Hypokalemia: Secondary | ICD-10-CM | POA: Insufficient documentation

## 2012-05-11 MED ORDER — MELOXICAM 15 MG PO TABS: 15.0000 mg | ORAL_TABLET | Freq: Every day | ORAL | Status: DC

## 2012-05-11 NOTE — Patient Instructions (Addendum)
Checking potassium today Take a multivitamin if it does not constipate you  Magnesium can be helpful for cramping  We will do urology consult at check out  Stay hydrated  Take the meloxicam with food as needed  Keep exercising -- low impact

## 2012-05-11 NOTE — Assessment & Plan Note (Signed)
In neck and R knee Will continue mobic with food and monitor Does not feel like she needs another shot Disc low impact exercise Commended on wt loss

## 2012-05-11 NOTE — Assessment & Plan Note (Signed)
Pt has occ toe cramps on bike Re check K and advise

## 2012-05-11 NOTE — Progress Notes (Signed)
Subjective:    Patient ID: Dana Greer, female    DOB: 06-03-1971, 41 y.o.   MRN: [id number]  HPI Here to discuss arthritis Has it in her R knee   --has had xrays and had cortisone shots and brace that she wears  Other knee only bothers her a little   Also having problems with bladder prolapse  With incontinence  Gyn referred her to urol- but cannot get that practice at [facility] - to call her back  Needs urol ref   Last saw Dr Mack Guise for her knee Did send her to PT --that did not really help  Now does some zumba Walks  Does the bike  occ gets toe cramps   Has had xrays of neck and also arthritis   Never had any dx of autoimmune arthritis No red joints No gout  Was taking meloxicam for her joint pain -- it was helping a lot     Chemistry      Component Value Date/Time   NA 141 03/08/2011 0616   K 3.3* 03/08/2011 0616   CL 105 03/08/2011 0616   CO2 28 03/08/2011 0616   BUN 14 03/08/2011 0616   CREATININE 1.01 03/08/2011 0616      Component Value Date/Time   CALCIUM 9.3 03/08/2011 0616   ALKPHOS 44 03/08/2011 0616   AST 15 03/08/2011 0616   ALT 10 03/08/2011 0616   BILITOT 0.2* 03/08/2011 0616       Wt is down 15 lb Lost and then gained a bit  Cut back and eating and going to the gym more often  bmi is 38     Review of Systems Review of Systems  Constitutional: Negative for fever, appetite change, fatigue and unexpected weight change.  Eyes: Negative for pain and visual disturbance.  Respiratory: Negative for cough and shortness of breath.   Cardiovascular: Negative for cp or palpitations    Gastrointestinal: Negative for nausea, diarrhea and constipation.  Genitourinary: pos  for urgency and frequency. and incontinence  MSK pos for R knee and neck pain , neg for joint swelling or redness  Skin: Negative for pallor or rash   Neurological: Negative for weakness, light-headedness, numbness and headaches.  Hematological: Negative for adenopathy. Does not bruise/bleed easily.    Psychiatric/Behavioral: Negative for dysphoric mood. The patient is not nervous/anxious.         Objective:   Physical Exam  Constitutional: She appears well-developed and well-nourished. No distress.  HENT:  Head: Normocephalic and atraumatic.  Mouth/Throat: Oropharynx is clear and moist.  Eyes: Conjunctivae and EOM are normal. Pupils are equal, round, and reactive to light. No scleral icterus.  Neck: Normal range of motion. Neck supple. No JVD present. Carotid bruit is not present. No thyromegaly present.  Abdominal: Soft. She exhibits no distension and no mass. There is no tenderness.       No suprapubic tenderness   No cva tenderness   Musculoskeletal: She exhibits tenderness. She exhibits no edema.       Medial tenderness of R knee with crepitus and poor rom  Stable - nl drawer and lachman No edema or effusion  No patellar tenderness  CS- tender over lower CS , and also in L trapezius muscle  Nl rom with pain on full flex and also L rotation   Lymphadenopathy:    She has no cervical adenopathy.  Neurological: She is alert. She has normal reflexes. No cranial nerve deficit. She exhibits normal muscle tone. Coordination normal.  Skin: Skin is warm and dry. No rash noted. No erythema. No pallor.  Psychiatric: She has a normal mood and affect.          Assessment & Plan:

## 2012-05-11 NOTE — Assessment & Plan Note (Signed)
Pt interested in bladder tack Will ref to urol to disc it

## 2012-07-30 ENCOUNTER — Encounter: Payer: Self-pay | Admitting: Obstetrics and Gynecology

## 2012-08-08 ENCOUNTER — Ambulatory Visit (INDEPENDENT_AMBULATORY_CARE_PROVIDER_SITE_OTHER): Payer: 59 | Admitting: Obstetrics and Gynecology

## 2012-08-08 ENCOUNTER — Encounter: Payer: Self-pay | Admitting: Obstetrics and Gynecology

## 2012-08-08 VITALS — BP 118/80 | Temp 98.9°F | Resp 18 | Ht 62.0 in | Wt 204.0 lb

## 2012-08-08 DIAGNOSIS — R1900 Intra-abdominal and pelvic swelling, mass and lump, unspecified site: Secondary | ICD-10-CM

## 2012-08-08 DIAGNOSIS — R1032 Left lower quadrant pain: Secondary | ICD-10-CM

## 2012-08-08 LAB — POCT URINALYSIS DIPSTICK
Bilirubin, UA: NEGATIVE
Glucose, UA: NEGATIVE
Ketones, UA: NEGATIVE
Leukocytes, UA: NEGATIVE
pH, UA: 8

## 2012-08-08 MED ORDER — IBUPROFEN 800 MG PO TABS: 800.0000 mg | ORAL_TABLET | Freq: Three times a day (TID) | ORAL | Status: DC | PRN

## 2012-08-08 MED ORDER — HYDROCODONE-ACETAMINOPHEN 5-500 MG PO TABS: 1.0000 | ORAL_TABLET | Freq: Four times a day (QID) | ORAL | Status: DC | PRN

## 2012-08-08 NOTE — Progress Notes (Addendum)
Pt here stating she had a prior aborted [facility] converted to [facility] secondary to her size.  She says she had hysterectomy secondary to heavy cycles and pain.  The heavy cycles have improved but she still has one but the pain is just as bad.  Surgery performed at [facility].  She also c/o bladder dropping and incontinence and had urodynamics at [facility] and was given enablex.  Also reports ? Cyst on left ovary.  Filed Vitals:   08/08/12 0931  BP: 118/80  Temp: 98.9 F (37.2 C)  Resp: 18   ROS: noncontributory  Pelvic exam:  VULVA: normal appearing vulva with no masses, tenderness or lesions,  VAGINA: normal appearing vagina with normal color and discharge, no lesions, mild cystocele CERVIX: normal appearing cervix without discharge or lesions,  UTERUS: uterus is normal size, shape, consistency and nontender, (feels like her uterus vs mass - tilted to rt despite having had a hysterectomy) ADNEXA: normal adnexa in size, nontender and no masses.  A/P S/p [facility] with ?adenomyosis ? Endometriosis Need to get records from [facility] sched u/s to eval mass (?uterus) Urine Cx RTO next available for u/s and f/u and determine POC

## 2012-08-10 ENCOUNTER — Telehealth: Payer: Self-pay | Admitting: Obstetrics and Gynecology

## 2012-08-10 LAB — URINE CULTURE: Colony Count: 60000

## 2012-08-10 NOTE — Telephone Encounter (Signed)
Ar pt 

## 2012-08-13 ENCOUNTER — Other Ambulatory Visit: Payer: Self-pay | Admitting: Obstetrics and Gynecology

## 2012-08-13 ENCOUNTER — Ambulatory Visit (INDEPENDENT_AMBULATORY_CARE_PROVIDER_SITE_OTHER): Payer: 59

## 2012-08-13 ENCOUNTER — Encounter: Payer: Self-pay | Admitting: Obstetrics and Gynecology

## 2012-08-13 ENCOUNTER — Ambulatory Visit (INDEPENDENT_AMBULATORY_CARE_PROVIDER_SITE_OTHER): Payer: 59 | Admitting: Obstetrics and Gynecology

## 2012-08-13 ENCOUNTER — Telehealth: Payer: Self-pay

## 2012-08-13 VITALS — BP 130/70 | Ht 62.0 in | Wt 200.0 lb

## 2012-08-13 DIAGNOSIS — R1900 Intra-abdominal and pelvic swelling, mass and lump, unspecified site: Secondary | ICD-10-CM

## 2012-08-13 DIAGNOSIS — R102 Pelvic and perineal pain unspecified side: Secondary | ICD-10-CM

## 2012-08-13 DIAGNOSIS — N949 Unspecified condition associated with female genital organs and menstrual cycle: Secondary | ICD-10-CM

## 2012-08-13 MED ORDER — PROGESTERONE MICRONIZED 100 MG PO CAPS: 100.0000 mg | ORAL_CAPSULE | Freq: Every day | ORAL | Status: DC

## 2012-08-13 NOTE — Progress Notes (Signed)
.  Here to f/u u/s secondary to pain and bleeding  Filed Vitals:   08/13/12 1656  BP: 130/70   Pelvic - no masses palpated today (?stool felt earlier)  Ultrasound today bilateral ovaries within normal limits right ovary 5.1 cm left ovary 3.6 cm no adnexal masses no free fluid uterus is surgically absent cervix is visualized  A/P Discussed trial of prometrium or some form of progesterone vs observation vs depo lupron vs trachelectomy Pt really feels she needs something for pain and to help the bleeding but mostly the pain RTO for f/u prometriuma nd revisit bladder "dropping" and incontinence (previously had urodynamics at [facility] urology)

## 2012-08-14 ENCOUNTER — Telehealth: Payer: Self-pay | Admitting: Obstetrics and Gynecology

## 2012-08-14 NOTE — Telephone Encounter (Signed)
Tc to pt per telephone call. Pt wants to let AR know if Prometrium does or doesn't help with bldg, pt strongly considering removal of ovaries. Appt sched 09/10/12 with AR to follow up meds. Pt states,"will cont Prometrium until that appt;unless AR otherwise suggest sooner appt. Will make AR aware.

## 2012-08-21 ENCOUNTER — Emergency Department (HOSPITAL_COMMUNITY)
Admission: EM | Admit: 2012-08-21 | Discharge: 2012-08-21 | Disposition: A | Discharge status: Home / Self Care | Payer: 59 | Attending: Emergency Medicine | Admitting: Emergency Medicine

## 2012-08-21 ENCOUNTER — Encounter (HOSPITAL_COMMUNITY): Payer: Self-pay | Admitting: *Deleted

## 2012-08-21 ENCOUNTER — Telehealth: Payer: Self-pay | Admitting: Family Medicine

## 2012-08-21 DIAGNOSIS — R51 Headache: Secondary | ICD-10-CM | POA: Insufficient documentation

## 2012-08-21 DIAGNOSIS — Z8739 Personal history of other diseases of the musculoskeletal system and connective tissue: Secondary | ICD-10-CM | POA: Insufficient documentation

## 2012-08-21 DIAGNOSIS — Z79899 Other long term (current) drug therapy: Secondary | ICD-10-CM | POA: Insufficient documentation

## 2012-08-21 DIAGNOSIS — Z791 Long term (current) use of non-steroidal anti-inflammatories (NSAID): Secondary | ICD-10-CM | POA: Insufficient documentation

## 2012-08-21 DIAGNOSIS — F17200 Nicotine dependence, unspecified, uncomplicated: Secondary | ICD-10-CM | POA: Insufficient documentation

## 2012-08-21 DIAGNOSIS — Z8659 Personal history of other mental and behavioral disorders: Secondary | ICD-10-CM | POA: Insufficient documentation

## 2012-08-21 DIAGNOSIS — IMO0001 Reserved for inherently not codable concepts without codable children: Secondary | ICD-10-CM | POA: Insufficient documentation

## 2012-08-21 DIAGNOSIS — M7918 Myalgia, other site: Secondary | ICD-10-CM

## 2012-08-21 DIAGNOSIS — R42 Dizziness and giddiness: Secondary | ICD-10-CM

## 2012-08-21 MED ORDER — KETOROLAC TROMETHAMINE 60 MG/2ML IM SOLN
60.0000 mg | Freq: Once | INTRAMUSCULAR | Status: AC
  Administered 2012-08-21: 60 mg via INTRAMUSCULAR
  Filled 2012-08-21: qty 2

## 2012-08-21 MED ORDER — MECLIZINE HCL 25 MG PO TABS
50.0000 mg | ORAL_TABLET | Freq: Once | ORAL | Status: AC
  Administered 2012-08-21: 50 mg via ORAL
  Filled 2012-08-21: qty 2

## 2012-08-21 MED ORDER — MECLIZINE HCL 50 MG PO TABS: 25.0000 mg | ORAL_TABLET | Freq: Three times a day (TID) | ORAL | Status: DC | PRN

## 2012-08-21 MED ORDER — DIAZEPAM 5 MG PO TABS: 5.0000 mg | ORAL_TABLET | Freq: Three times a day (TID) | ORAL | Status: DC | PRN

## 2012-08-21 NOTE — Telephone Encounter (Signed)
She is in ER now - will wait on notes

## 2012-08-21 NOTE — Telephone Encounter (Signed)
Caller: [name]/Patient; Patient Name: Dana Greer; PCP: Roxy Manns [facility]); Best Callback Phone Number: [phone]; Reason for call: Calling emergent line with new onset Headache with severe dizziness.  Onset 08/20/12, but has worsened overnight.  States she has not had this significant a headache in many years.  Recently started progesterone.  Per headache protocol, ED disposition; advised ED now.  Patient agrees; will go to [facility] ED.

## 2012-08-21 NOTE — ED Provider Notes (Signed)
History     CSN: [id number]  Arrival date & time 08/21/12  4098   First MD Initiated Contact with Patient 08/21/12 1033      Chief Complaint  Patient presents with  . Headache    (Consider location/radiation/quality/duration/timing/severity/associated sxs/prior treatment) HPI Comments: Patient reports she woke up Sunday with back and neck pain, that gradually changed, back pain resolved and patient began to have a headache and today began having dizziness.  States that when she moves her head or her eyes to the side, the room spins.  Spinning is worse with walking fast.  Pt also now with soreness over her right upper back, worse with palpation and movement.  Pt has taken ibuprofen and mobic with minimal relief.  Pt denies fevers, trauma, acute onset of headache.  Last headache of this intensity was one year ago.  Denies any focal neurological deficits, CP, SOB, palpitations, vomiting, abdominal pain.    The history is provided by the patient.    Past Medical History  Diagnosis Date  . Arthritis   . Depression   . Headache   . Western blot positive HSV2     Past Surgical History  Procedure Date  . Cesarean section   . Partial hysterectomy 2012  . Wisdom tooth extraction 1995  . Cesarean section 2008    Family History  Problem Relation Age of Onset  . Diabetes Father   . Cancer Paternal Aunt     breast  . Cancer Paternal Grandmother     colon CA    History  Substance Use Topics  . Smoking status: Current Some Day Smoker  . Smokeless tobacco: Never Used  . Alcohol Use: Yes     rarely    OB History    Grav Para Term Preterm Abortions TAB SAB Ect Mult Living                  Review of Systems  Constitutional: Negative for fever and chills.  HENT: Positive for neck pain. Negative for neck stiffness.   Respiratory: Negative for shortness of breath.   Cardiovascular: Negative for chest pain and palpitations.  Gastrointestinal: Positive for nausea. Negative for  vomiting and abdominal pain.  Musculoskeletal: Negative for gait problem.  Neurological: Positive for dizziness and headaches. Negative for weakness and numbness.    Allergies  Amoxicillin  Home Medications   Current Outpatient Rx  Name Route Sig Dispense Refill  . ALPRAZOLAM 1 MG PO TABS Oral Take 1 mg by mouth 4 (four) times daily as needed. anxiety    . DARIFENACIN HYDROBROMIDE ER 7.5 MG PO TB24 Oral Take 7.5 mg by mouth daily.    Marland Kitchen HYDROCODONE-ACETAMINOPHEN 5-500 MG PO TABS Oral Take 1-2 tablets by mouth every 6 (six) hours as needed. For pain    . IBUPROFEN 800 MG PO TABS Oral Take 800 mg by mouth every 8 (eight) hours as needed.    . MELOXICAM 15 MG PO TABS Oral Take 15 mg by mouth daily. Take with food    . PROGESTERONE MICRONIZED 100 MG PO CAPS Oral Take 100 mg by mouth daily.      BP 146/91  Pulse 69  Temp 98.6 F (37 C) (Oral)  Resp 16  SpO2 100%  Physical Exam  Nursing note and vitals reviewed. Constitutional: She appears well-developed and well-nourished. No distress.  HENT:  Head: Normocephalic and atraumatic.  Neck: Neck supple.       Pt touches chin to chest with no difficulty  or pain, does not move side to side because it increases her symptoms.   Cardiovascular: Normal rate and regular rhythm.   Pulmonary/Chest: Effort normal and breath sounds normal. No respiratory distress. She has no wheezes. She has no rales.  Abdominal: Soft. She exhibits no distension. There is no tenderness. There is no rebound and no guarding.  Musculoskeletal:       Arms:      Strength 5/5, sensation intact, distal pulses intact.     Neurological: She is alert. GCS eye subscore is 4. GCS verbal subscore is 5. GCS motor subscore is 6.       CN II-XII intact, EOMs intact, no pronator drift, grip strengths equal bilaterally; strength 5/5 in all extremities, sensation intact in all extremities; finger to nose, heel to shin, rapid alternating movements normal; gait is normal.     Skin:  She is not diaphoretic.    ED Course  Procedures (including critical care time)  Labs Reviewed - No data to display No results found.  12:24 PM Pt reports complete relief of HA and dizziness after meclizine and toradol.  States she is feeling much better.  States she still has soreness in her back but prefers to be d/c home, declines further treatment for pain.    1. Headache   2. Dizziness   3. Musculoskeletal pain     MDM  Pt with headache, back pain, dizziness.  No red flags for headache.  No neurological deficits.  Vertiginous symptoms reproduced with head and eye movement.  Likely peripheral vertigo.  Doubt stroke, SAH, meningitis, other acute intracranial process.  Pt's symptoms of headache and vertigo completely relieved with toradol and meclizine.  Pt with diffuse muscle soreness of right upper back.  D/C home with valium for muscle soreness/spasm and meclizine for dizziness.  Discussed diagnosis and treatment plan with patient.  Pt given return precautions.  Pt verbalizes understanding and agrees with plan.           [place], [place] 08/21/12 1526

## 2012-08-21 NOTE — ED Notes (Signed)
To ED for eval of HA since Sunday. Hx of migraines but last one being a year ago. Alert and oriented. Ambulatory. Denies N/V. Skin w/d

## 2012-08-21 NOTE — ED Notes (Signed)
Patient states she has headache since Sunday with nausea. She woke up Monday at its worst. Headache is a 8/10 right now. Pain is from the top of her head down to her neck with a difficulty focusing her sight. Patient started progesterone apprx a week ago.

## 2012-08-22 ENCOUNTER — Encounter: Payer: 59 | Admitting: Obstetrics and Gynecology

## 2012-08-22 ENCOUNTER — Other Ambulatory Visit: Payer: 59

## 2012-08-22 NOTE — ED Provider Notes (Signed)
Medical screening examination/treatment/procedure(s) were performed by non-physician practitioner and as supervising physician I was immediately available for consultation/collaboration.  Tobin Chad, MD 08/22/12 [phone]

## 2012-08-23 ENCOUNTER — Encounter: Payer: Self-pay | Admitting: Family Medicine

## 2012-08-23 ENCOUNTER — Ambulatory Visit (INDEPENDENT_AMBULATORY_CARE_PROVIDER_SITE_OTHER): Payer: 59 | Admitting: Family Medicine

## 2012-08-23 ENCOUNTER — Telehealth: Payer: Self-pay | Admitting: Family Medicine

## 2012-08-23 VITALS — BP 160/90 | HR 64 | Temp 98.4°F | Wt 202.0 lb

## 2012-08-23 DIAGNOSIS — R51 Headache: Secondary | ICD-10-CM

## 2012-08-23 LAB — COMPREHENSIVE METABOLIC PANEL
ALT: 15 U/L (ref 0–35)
AST: 16 U/L (ref 0–37)
Albumin: 3.5 g/dL (ref 3.5–5.2)
Alkaline Phosphatase: 38 U/L — ABNORMAL LOW (ref 39–117)
Potassium: 3.9 mEq/L (ref 3.5–5.1)
Sodium: 142 mEq/L (ref 135–145)
Total Bilirubin: 0.5 mg/dL (ref 0.3–1.2)
Total Protein: 6.8 g/dL (ref 6.0–8.3)

## 2012-08-23 LAB — CBC WITH DIFFERENTIAL/PLATELET
Basophils Absolute: 0 10*3/uL (ref 0.0–0.1)
Eosinophils Absolute: 0.1 10*3/uL (ref 0.0–0.7)
Eosinophils Relative: 1.5 % (ref 0.0–5.0)
MCV: 96.1 fl (ref 78.0–100.0)
Monocytes Absolute: 0.3 10*3/uL (ref 0.1–1.0)
Neutrophils Relative %: 55.7 % (ref 43.0–77.0)
Platelets: 136 10*3/uL — ABNORMAL LOW (ref 150.0–400.0)
RDW: 13.5 % (ref 11.5–14.6)
WBC: 5.2 10*3/uL (ref 4.5–10.5)

## 2012-08-23 MED ORDER — HYDROCODONE-ACETAMINOPHEN 10-325 MG PO TABS: 1.0000 | ORAL_TABLET | Freq: Three times a day (TID) | ORAL | Status: DC | PRN

## 2012-08-23 NOTE — Patient Instructions (Signed)
You do have symptoms of vertigo but given the severity of your symptoms, I do want to order an MRI. If your symptoms worsen or you develop fever, you need to go back to the ER.  Continue the antivert.  Vicodin as needed.  Stop by to see Shirlee Limerick on your way out.

## 2012-08-23 NOTE — Progress Notes (Signed)
Subjective:    Patient ID: Dana Greer, female    DOB: January 30, 1971, 41 y.o.   MRN: [id number]  HPI  41 yo pt of Dr. Milinda Antis new to me here for ER follow up.  Has had a severe HA, neck stiffness and dizziness x 2 days.  Called here and advised to go to ER.  ER note reviewed- no imaging or labs done.  No LP done.  Felt her symptoms were consistent with muscle strain and vertigo.  Given toradol and meclizine in ER and symptoms improved for a couple of hours. Now HA is more severe, starts in her neck, goes up her head.  Dizziness is worsened by changes in head position.  Does have nausea and diarrhea.  Now with head stiffness.  She is having some photophobia this morning.  Had migraines years ago but this feels different.  No UE weakness.   Patient Active Problem List  Diagnosis  . GOITER NOS  . POLYCYSTIC OVARIAN DISEASE  . REACTION, ACUTE STRESS W/EMOTIONAL DSTURB  . ADJ DISORDER WITH MIXED ANXIETY & DEPRESSED MOOD  . HIRSUTISM  . ARTHROPATHY NOS, MULTIPLE SITES  . BACK PAIN, LUMBAR, CHRONIC  . HYPERHIDROSIS  . HYPERGLYCEMIA  . ELEVATED BLOOD PRESSURE WITHOUT DIAGNOSIS OF HYPERTENSION  . Allergic conjunctivitis  . Sinusitis  . Osteoarthritis  . Female bladder prolapse  . Hypokalemia   Past Medical History  Diagnosis Date  . Arthritis   . Depression   . Headache   . Western blot positive HSV2    Past Surgical History  Procedure Date  . Cesarean section   . Partial hysterectomy 2012  . Wisdom tooth extraction 1995  . Cesarean section 2008   History  Substance Use Topics  . Smoking status: Current Some Day Smoker  . Smokeless tobacco: Never Used  . Alcohol Use: Yes     rarely   Family History  Problem Relation Age of Onset  . Diabetes Father   . Cancer Paternal Aunt     breast  . Cancer Paternal Grandmother     colon CA   Allergies  Allergen Reactions  . Amoxicillin     Yeast infection    Current Outpatient Prescriptions on File Prior to Visit    Medication Sig Dispense Refill  . ALPRAZolam (XANAX) 1 MG tablet Take 1 mg by mouth 4 (four) times daily as needed. anxiety      . darifenacin (ENABLEX) 7.5 MG 24 hr tablet Take 7.5 mg by mouth daily.      . diazepam (VALIUM) 5 MG tablet Take 1 tablet (5 mg total) by mouth every 8 (eight) hours as needed (muscle spasm or pain).  10 tablet  0  . HYDROcodone-acetaminophen (VICODIN) 5-500 MG per tablet Take 1-2 tablets by mouth every 6 (six) hours as needed. For pain      . ibuprofen (ADVIL,MOTRIN) 800 MG tablet Take 800 mg by mouth every 8 (eight) hours as needed.      . meclizine (ANTIVERT) 50 MG tablet Take 0.5-1 tablets (25-50 mg total) by mouth 3 (three) times daily as needed for dizziness.  15 tablet  0  . meloxicam (MOBIC) 15 MG tablet Take 15 mg by mouth daily. Take with food      . progesterone (PROMETRIUM) 100 MG capsule Take 100 mg by mouth daily.       The PMH, PSH, Social History, Family History, Medications, and allergies have been reviewed in [facility], and have been updated if relevant.  Review of Systems See HPI    Objective:   Physical Exam BP 160/90  Pulse 64  Temp 98.4 F (36.9 C)  Wt 202 lb (91.627 kg)  Constitutional: She appears well-developed and well-nourished. No distress.  HENT:  Head: Normocephalic and atraumatic.  Neck: Neck supple.  Pt touches chin to chest with hesitation and pain but can do it.  Does not move side to side because it increases her symptoms. She is also sensitive to light. Cardiovascular: Normal rate and regular rhythm.  Pulmonary/Chest: Effort normal and breath sounds normal. No respiratory distress. She has no wheezes. She has no rales.  Abdominal: Soft. She exhibits no distension. There is no tenderness. There is no rebound and no guarding.  Musculoskeletal:  Arms: Strength 5/5, sensation intact, distal pulses intact.  Neurological:  CN II-XII intact, EOMs intact, no pronator drift, grip strengths equal bilaterally; strength 5/5 in all  extremities, sensation intact in all extremities; finger to nose, heel to shin, rapid alternating movements normal; gait is normal.  Skin: She is not diaphoretic.     Assessment & Plan:   1. Headache  MR Brain W Wo Contrast, CBC with Differential, Comprehensive metabolic panel  Deteriorated. Does have some findings consistent with vertigo but I am concerned about neck stiffness and photophobia.  Unfortunately, no LP done in ER to rule out meningitis.  Currently afebrile.  ?also possible migraine variant. Will check labs, MRI as well to rule out herniation/other pathology.  Continue antivert, given Vicodin for pain.  If symptoms deteriorate, pt aware she needs to return to ER immediately for LP. The patient indicates understanding of these issues and agrees with the plan.

## 2012-08-23 NOTE — Telephone Encounter (Signed)
Caller: Haily/Patient; Patient Name: Dana Greer; PCP: Roxy Manns [facility]); Best Callback Phone Number: [phone] Onset- 08/21/12 Afebrile. Pt c/o of dizziness, vertigo, headache, metallic taste in her mouth, and neck stiffness. She called office on Tueday 08/21/12 and was told to go to ED. She went to ED and they started her on Antivert and Valium. Pt denies improvement with s/s. She states headache is at a 8 on pain scale. Emergent s/s of Headache prortocol r/o. Pt to see provider within 24hrs. Appointment scheduled this morning for 10:15am with Dr. Dayton Martes.

## 2012-08-24 NOTE — Addendum Note (Signed)
Addended by: Dianne Dun on: 08/24/2012 12:31 PM   Modules accepted: Orders

## 2012-08-26 ENCOUNTER — Ambulatory Visit
Admission: RE | Admit: 2012-08-26 | Discharge: 2012-08-26 | Disposition: A | Discharge status: Home / Self Care | Payer: 59 | Source: Ambulatory Visit | Attending: Family Medicine | Admitting: Family Medicine

## 2012-08-26 ENCOUNTER — Other Ambulatory Visit: Payer: 59

## 2012-08-26 DIAGNOSIS — R51 Headache: Secondary | ICD-10-CM

## 2012-08-27 ENCOUNTER — Telehealth: Payer: Self-pay | Admitting: *Deleted

## 2012-08-27 NOTE — Telephone Encounter (Signed)
That is fine with me if ok with Dr Dayton Martes- you have to ask her

## 2012-08-27 NOTE — Telephone Encounter (Signed)
Pt is asking to change from Dr. Milinda Antis to Dr Dayton Martes.  She says it's easier for her to get appt with Dr. Dayton Martes.  Please advise.

## 2012-08-28 ENCOUNTER — Other Ambulatory Visit: Payer: Self-pay | Admitting: Family Medicine

## 2012-08-28 ENCOUNTER — Encounter: Payer: Self-pay | Admitting: *Deleted

## 2012-08-28 DIAGNOSIS — R51 Headache: Secondary | ICD-10-CM

## 2012-08-28 NOTE — Telephone Encounter (Signed)
Ok with me.  Please change PCP and make 30 minute follow up appt with me.

## 2012-08-28 NOTE — Telephone Encounter (Signed)
Advised patient, appt scheduled. 

## 2012-08-31 ENCOUNTER — Encounter: Payer: Self-pay | Admitting: *Deleted

## 2012-08-31 ENCOUNTER — Ambulatory Visit (INDEPENDENT_AMBULATORY_CARE_PROVIDER_SITE_OTHER): Payer: 59 | Admitting: Internal Medicine

## 2012-08-31 ENCOUNTER — Telehealth: Payer: Self-pay | Admitting: Family Medicine

## 2012-08-31 ENCOUNTER — Encounter: Payer: Self-pay | Admitting: Internal Medicine

## 2012-08-31 VITALS — BP 158/98 | HR 76 | Temp 98.8°F | Wt 202.5 lb

## 2012-08-31 DIAGNOSIS — G43919 Migraine, unspecified, intractable, without status migrainosus: Secondary | ICD-10-CM | POA: Insufficient documentation

## 2012-08-31 MED ORDER — METHYLPREDNISOLONE ACETATE 40 MG/ML IJ SUSP
80.0000 mg | Freq: Once | INTRAMUSCULAR | Status: AC
  Administered 2012-08-31: 80 mg via INTRAMUSCULAR

## 2012-08-31 NOTE — Assessment & Plan Note (Signed)
Chronic balance issues but now with bad persistent headache Sick with triptans? Meclizine and diazepam no help Will try depomedrol injection Headache referral Try the hydrocodone and sleep this weekend Out of work for today

## 2012-08-31 NOTE — Addendum Note (Signed)
Addended by: Shon Millet on: 08/31/2012 12:55 PM   Modules accepted: Orders

## 2012-08-31 NOTE — Telephone Encounter (Signed)
Caller: [name]/Patient; Patient Name: Dana Greer; PCP: Ruthe Mannan [facility]); Best Callback Phone Number: [phone].  Pt. is calling to report increased vertigo, and severe head and neck pain.  Triaged with Dizziness or Vertigo, and the Disposition for:  "Having sensations of turning or spinning that affects balance and not responsive to 4 hours of home care:  See Provider within 4 hours".  Pt. was scheduled with Dr. Alphonsus Sias, as Dr. Dayton Martes was not in office today.  Appointment:  11:10 today 08/31/12.  Home care instructions given.  db/CAN.

## 2012-08-31 NOTE — Progress Notes (Signed)
Subjective:    Patient ID: Dana Greer, female    DOB: [birth year], 41 y.o.   MRN: [id number]  HPI Hasn't been taking the antivert because it makes her dizzy Not taking the pain pills either--- some help but can't work with them Uses 2 computer monitors at [facility] makes things worse  Has been going on for some time Balance problems for a long time---would bump into walls Floor seems to move when she walks  Now not able to work right  Does have photophobia Worse with movement or eye tracking Has nausea Not bad sonophobia---light is worse  Tried valium Didn't do any good  No weakness Has had some numbness in upper right arm only No speech or swallowing problems  Current Outpatient Prescriptions on File Prior to Visit  Medication Sig Dispense Refill  . ALPRAZolam (XANAX) 1 MG tablet Take 1 mg by mouth 4 (four) times daily as needed. anxiety      . darifenacin (ENABLEX) 7.5 MG 24 hr tablet Take 7.5 mg by mouth daily.      . diazepam (VALIUM) 5 MG tablet Take 1 tablet (5 mg total) by mouth every 8 (eight) hours as needed (muscle spasm or pain).  10 tablet  0  . HYDROcodone-acetaminophen (NORCO) 10-325 MG per tablet Take 1 tablet by mouth every 8 (eight) hours as needed for pain.  30 tablet  0  . ibuprofen (ADVIL,MOTRIN) 800 MG tablet Take 800 mg by mouth every 8 (eight) hours as needed.      . meclizine (ANTIVERT) 50 MG tablet Take 0.5-1 tablets (25-50 mg total) by mouth 3 (three) times daily as needed for dizziness.  15 tablet  0  . meloxicam (MOBIC) 15 MG tablet Take 15 mg by mouth daily. Take with food      . progesterone (PROMETRIUM) 100 MG capsule Take 100 mg by mouth daily.        Allergies  Allergen Reactions  . Amoxicillin     Yeast infection     Past Medical History  Diagnosis Date  . Arthritis   . Depression   . Headache   . Western blot positive HSV2     Past Surgical History  Procedure Date  . Cesarean section   . Partial hysterectomy 2012  .  Wisdom tooth extraction 1995  . Cesarean section 2008    Family History  Problem Relation Age of Onset  . Diabetes Father   . Cancer Paternal Aunt     breast  . Cancer Paternal Grandmother     colon CA    History   Social History  . Marital Status: Legally Separated    Spouse Name: N/A    Number of Children: 3  . Years of Education: N/A   Occupational History  . Collections, [employer]    Social History Main Topics  . Smoking status: Never Smoker   . Smokeless tobacco: Never Used  . Alcohol Use: Yes     rarely  . Drug Use: Yes     marijuana  . Sexually Active: Yes    Birth Control/ Protection: None   Other Topics Concern  . Not on file   Social History Narrative  . No narrative on file   Review of Systems Still on progesterone due to endometriosis despite hysterectomy Chronic migraines---thinks these are like that No fever--?or slight    Objective:   Physical Exam  Constitutional: She appears well-developed and well-nourished.       Obvious pain photophobia  Neck:  Normal range of motion.          Assessment & Plan:

## 2012-08-31 NOTE — Telephone Encounter (Signed)
Will evaluate then 

## 2012-09-03 NOTE — Telephone Encounter (Signed)
Chart note to close encounter only. Corbett Moulder, Jacqueline A  

## 2012-09-04 ENCOUNTER — Ambulatory Visit (INDEPENDENT_AMBULATORY_CARE_PROVIDER_SITE_OTHER): Payer: 59 | Admitting: Family Medicine

## 2012-09-04 ENCOUNTER — Encounter: Payer: Self-pay | Admitting: Family Medicine

## 2012-09-04 VITALS — BP 140/82 | HR 68 | Temp 98.2°F | Wt 204.0 lb

## 2012-09-04 DIAGNOSIS — G43919 Migraine, unspecified, intractable, without status migrainosus: Secondary | ICD-10-CM

## 2012-09-04 NOTE — Patient Instructions (Addendum)
Please stay home- take meclizine and hydrocodone as needed.

## 2012-09-04 NOTE — Progress Notes (Signed)
Subjective:    Patient ID: Dana Greer, female    DOB: [birth year], 41 y.o.   MRN: [id number]  HPI  41 yo pt here to transfer care to me.  I initially saw her on 10/23 for acute visit/ER follow up.  At that time, she had  severe HA, neck stiffness and dizziness x 2 days.  Called here and advised to go to ER.  ER note reviewed- no imaging or labs done.  No LP done.  Felt her symptoms were consistent with muscle strain and vertigo.  Given toradol and meclizine in ER and symptoms improved for a couple of hours but returned and seemed worse.  Dizziness was worsened by changes in head position and did have neck stiffness and photophobia.  H/o migraines but felt this was different. ? Vertigo vs. Migraine variant.   Labs were unremarkable.  I did order MRI of head which showed: Mr Brain Wo Contrast  08/27/2012  *RADIOLOGY REPORT*  Clinical Data: Headache.  Photophobia.  MRI HEAD WITHOUT CONTRAST  Technique:  Multiplanar, multiecho pulse sequences of the brain and surrounding structures were obtained according to standard protocol without intravenous contrast.  Comparison: CT 07/04/2010  Findings: Diffusion imaging does not show any acute or subacute infarction.  The brainstem is normal.  There is an old small vessel infarction in the right thalamus.  The remainder the brain appears normal without large or small vessel infarction.  No mass lesion, hemorrhage, hydrocephalus or extra-axial collection.  The patient has a tendency towards dural calcification.  There is no pituitary mass.  There is arachnoid herniation into the sella, a common normal variant, rarely associated with increased intracranial pressure.  No fluid in the sinuses, middle ears or mastoids.  IMPRESSION: No acute brain pathology.  Small vessel infarction in the right thalamus that looks old.  Arachnoid herniation into the sella.  Usually this is an insignificant normal variant.  Occasionally, it can be associated with intracranial  hypertension.   Original Report Authenticated By: Thomasenia Sales, M.D.    She returned on 11/1 and saw Dr. Alphonsus Sias for persistent HA and dizziness.  He also felt this was due to a migraine variant.  She was given depo medrol and referred to HA clinic.    Meclizine and hydrocodone are still helping but headache and dizziness have persisted.  Feels she is unsafe to drive.  Patient Active Problem List  Diagnosis  . GOITER NOS  . POLYCYSTIC OVARIAN DISEASE  . REACTION, ACUTE STRESS W/EMOTIONAL DSTURB  . ADJ DISORDER WITH MIXED ANXIETY & DEPRESSED MOOD  . HIRSUTISM  . ARTHROPATHY NOS, MULTIPLE SITES  . BACK PAIN, LUMBAR, CHRONIC  . HYPERHIDROSIS  . HYPERGLYCEMIA  . ELEVATED BLOOD PRESSURE WITHOUT DIAGNOSIS OF HYPERTENSION  . Allergic conjunctivitis  . Sinusitis  . Osteoarthritis  . Female bladder prolapse  . Hypokalemia  . Intractable migraine   Past Medical History  Diagnosis Date  . Arthritis   . Depression   . Headache   . Western blot positive HSV2    Past Surgical History  Procedure Date  . Cesarean section   . Partial hysterectomy 2012  . Wisdom tooth extraction 1995  . Cesarean section 2008   History  Substance Use Topics  . Smoking status: Never Smoker   . Smokeless tobacco: Never Used  . Alcohol Use: Yes     Comment: rarely   Family History  Problem Relation Age of Onset  . Diabetes Father   . Cancer Paternal Aunt  breast  . Cancer Paternal Grandmother     colon CA   Allergies  Allergen Reactions  . Amoxicillin     Yeast infection    Current Outpatient Prescriptions on File Prior to Visit  Medication Sig Dispense Refill  . ALPRAZolam (XANAX) 1 MG tablet Take 1 mg by mouth 4 (four) times daily as needed. anxiety      . darifenacin (ENABLEX) 7.5 MG 24 hr tablet Take 7.5 mg by mouth daily.      . diazepam (VALIUM) 5 MG tablet Take 1 tablet (5 mg total) by mouth every 8 (eight) hours as needed (muscle spasm or pain).  10 tablet  0  .  HYDROcodone-acetaminophen (NORCO) 10-325 MG per tablet Take 1 tablet by mouth every 8 (eight) hours as needed for pain.  30 tablet  0  . ibuprofen (ADVIL,MOTRIN) 800 MG tablet Take 800 mg by mouth every 8 (eight) hours as needed.      . meclizine (ANTIVERT) 50 MG tablet Take 0.5-1 tablets (25-50 mg total) by mouth 3 (three) times daily as needed for dizziness.  15 tablet  0  . meloxicam (MOBIC) 15 MG tablet Take 15 mg by mouth daily. Take with food      . progesterone (PROMETRIUM) 100 MG capsule Take 100 mg by mouth daily.       The PMH, PSH, Social History, Family History, Medications, and allergies have been reviewed in [facility], and have been updated if relevant.    Review of Systems See HPI    Objective:   Physical Exam BP 140/82  Pulse 68  Temp 98.2 F (36.8 C)  Wt 204 lb (92.534 kg)  Constitutional: She appears well-developed and well-nourished. No distress.  HENT:  Head: Normocephalic and atraumatic.  Neck: Neck supple.  Cardiovascular: Normal rate and regular rhythm.  Pulmonary/Chest: Effort normal and breath sounds normal. No respiratory distress. She has no wheezes. She has no rales.  Abdominal: Soft. She exhibits no distension. There is no tenderness. There is no rebound and no guarding.  Musculoskeletal:  Arms: Strength 5/5, sensation intact, distal pulses intact.  Neurological:  CN II-XII intact, EOMs intact, no pronator drift, grip strengths equal bilaterally; strength 5/5 in all extremities, sensation intact in all extremities; finger to nose, heel to shin, rapid alternating movements normal; gait is normal.  Skin: She is not diaphoretic.     Assessment & Plan:   1. Intractable migraine    Does get relief with valium, antivert and hydrocodone if very severe. Note given to keep her out of work until her neurology appointment since computer screen seems to worsen her headaches and she is fearful of driving at this point.  Pt aware of red flag symptoms requiring  immediate follow up.

## 2012-09-06 DIAGNOSIS — Z0279 Encounter for issue of other medical certificate: Secondary | ICD-10-CM

## 2012-09-07 ENCOUNTER — Telehealth: Payer: Self-pay | Admitting: Obstetrics and Gynecology

## 2012-09-10 ENCOUNTER — Encounter: Payer: Self-pay | Admitting: Obstetrics and Gynecology

## 2012-09-10 ENCOUNTER — Ambulatory Visit (INDEPENDENT_AMBULATORY_CARE_PROVIDER_SITE_OTHER): Payer: 59 | Admitting: Obstetrics and Gynecology

## 2012-09-10 VITALS — BP 114/78 | Resp 14 | Ht 62.0 in | Wt 203.0 lb

## 2012-09-10 DIAGNOSIS — T679XXA Effect of heat and light, unspecified, initial encounter: Secondary | ICD-10-CM

## 2012-09-10 DIAGNOSIS — N951 Menopausal and female climacteric states: Secondary | ICD-10-CM

## 2012-09-10 DIAGNOSIS — R6889 Other general symptoms and signs: Secondary | ICD-10-CM

## 2012-09-10 DIAGNOSIS — Z139 Encounter for screening, unspecified: Secondary | ICD-10-CM

## 2012-09-10 DIAGNOSIS — Z113 Encounter for screening for infections with a predominantly sexual mode of transmission: Secondary | ICD-10-CM

## 2012-09-10 MED ORDER — VALACYCLOVIR HCL 500 MG PO TABS: 500.0000 mg | ORAL_TABLET | Freq: Every day | ORAL | Status: AC

## 2012-09-10 MED ORDER — DARIFENACIN HYDROBROMIDE ER 7.5 MG PO TB24: 7.5000 mg | ORAL_TABLET | Freq: Every day | ORAL | Status: DC

## 2012-09-10 MED ORDER — PROGESTERONE MICRONIZED 100 MG PO CAPS: 100.0000 mg | ORAL_CAPSULE | Freq: Every day | ORAL | Status: DC

## 2012-09-10 NOTE — Progress Notes (Signed)
Here to f/u prometrium for pelvic pain s/p Lap [facility] and to f/u bladder dropping and incontinence.  Heat intolerance.  C/o hot flashes.  Wants to cont enablex 7.5mg  qd.  Requests valtrex refill for suppression but also would like confirmatory testing.    She likes the prometrium and wants to continue.  Filed Vitals:   09/10/12 1544  BP: 114/78  Resp: 14   A/P PCP following mildly decreased plts Rx valtrex, enablex and prometrium AEX prn

## 2012-09-11 LAB — HSV 1 ANTIBODY, IGG: HSV 1 Glycoprotein G Ab, IgG: 0.19 IV

## 2012-09-11 LAB — HSV 2 ANTIBODY, IGG: HSV 2 Glycoprotein G Ab, IgG: 7.93 IV — ABNORMAL HIGH

## 2012-09-19 ENCOUNTER — Telehealth: Payer: Self-pay

## 2012-09-19 NOTE — Telephone Encounter (Signed)
Dr A, I called [facility] and got you the office note from todays visit , it is in your in box. No other Neurologist in [place]. [facility] , Dr Theora Master or Dr Sherryll Burger has a headache clinic that his P.A. Runs and that is at [place]. Pls advise and place another referral for what your referring her for! Thanks

## 2012-09-19 NOTE — Telephone Encounter (Signed)
Pt saw Dr Terrace Arabia at [facility] today; pt  wants second opinion; pt said Dr Terrace Arabia said could not find anything wrong probably stress. Pt said dizziness is better now; it comes and goes, not dizzy all the time.Pt cannot take med and drive; pt has to drive to get to work. Most importantly pt said she does not feel good all the time and wants to know why.Please advise.

## 2012-09-19 NOTE — Telephone Encounter (Signed)
I'm sorry to hear it was not a good experience.  Shirlee Limerick, is there another neurologist we can refer to for a second opinion? Thanks

## 2012-09-20 NOTE — Telephone Encounter (Signed)
I reviewed the note and it sounds appropriate- Dr. Terrace Arabia agreed that she likely has vertigo and migraines and adjusted her medications.  If she would like a second opinion, that is ok.  I will place referral.

## 2012-09-20 NOTE — Telephone Encounter (Signed)
Left message asking patient to call back

## 2012-09-20 NOTE — Telephone Encounter (Signed)
Advised patient.  She said she had a terrible experience with Dr. Terrace Arabia and does want 2nd opinion.  She's going to look around and find a doctor that she wants to see and will call us back with that information.

## 2012-09-21 ENCOUNTER — Telehealth: Payer: Self-pay

## 2012-09-21 ENCOUNTER — Other Ambulatory Visit: Payer: Self-pay

## 2012-09-21 DIAGNOSIS — E559 Vitamin D deficiency, unspecified: Secondary | ICD-10-CM

## 2012-09-21 NOTE — Telephone Encounter (Signed)
Called Vit. D protocol to [facility] pharmacy in [place]. Vit D softgels 50,000 units twice wkly x 8 wks # 16 0 RF's. Per protocol. Melody Comas A

## 2012-09-24 MED ORDER — ONDANSETRON HCL 4 MG PO TABS: 4.0000 mg | ORAL_TABLET | Freq: Three times a day (TID) | ORAL | Status: DC | PRN

## 2012-09-24 NOTE — Telephone Encounter (Signed)
I cannot write her out of work for that length of time.  I doubt Dana Greer can make appt sooner but I will send this note to her. Is she currently taking FMLA?

## 2012-09-24 NOTE — Telephone Encounter (Signed)
I spoke with patient and told her that you cannot write her out of work until her appt.  She said she will be able to get short term disability.  I also told her we cannot get her a sooner appt with neurologist.  She asked what is she supposed to do for her pain until then, continue taking vicodin?  She's also requesting something for nausea to be sent to her pharmacy.

## 2012-09-24 NOTE — Telephone Encounter (Signed)
In terms of her pain, I would recommend following the recommendations and taking the medications prescribed by Dr. Terrace Arabia and Vicodin for severe pain.  They are appropriate medications for migraine headaches.   Rx for Zofran sent to [facility].

## 2012-09-24 NOTE — Telephone Encounter (Signed)
Advised patient as instructed. 

## 2012-09-24 NOTE — Telephone Encounter (Signed)
Pt has scheduled appt with Dr Karenann Cai in [place] for 11/08/12; phone [phone].  Pt is on cancellation list also. Pt request Dr Dayton Martes to either get pt care coordinator to get sooner appt with Dr Clarisse Gouge or write letter for pt to be out of work until 11/08/12. Pt said she is still not able to drive or work. Pt also request med to [facility] for nausea and vomiting due to h/a.Please advise.

## 2012-09-25 ENCOUNTER — Other Ambulatory Visit: Payer: 59

## 2012-09-25 ENCOUNTER — Other Ambulatory Visit (INDEPENDENT_AMBULATORY_CARE_PROVIDER_SITE_OTHER): Payer: 59

## 2012-09-25 DIAGNOSIS — E559 Vitamin D deficiency, unspecified: Secondary | ICD-10-CM

## 2012-09-25 DIAGNOSIS — R7989 Other specified abnormal findings of blood chemistry: Secondary | ICD-10-CM

## 2012-09-25 LAB — CBC WITH DIFFERENTIAL/PLATELET
Basophils Absolute: 0 10*3/uL (ref 0.0–0.1)
Eosinophils Relative: 0.6 % (ref 0.0–5.0)
HCT: 42.3 % (ref 36.0–46.0)
Hemoglobin: 13.9 g/dL (ref 12.0–15.0)
Lymphocytes Relative: 32.2 % (ref 12.0–46.0)
Lymphs Abs: 2 10*3/uL (ref 0.7–4.0)
Monocytes Relative: 5.1 % (ref 3.0–12.0)
Neutro Abs: 3.9 10*3/uL (ref 1.4–7.7)
WBC: 6.3 10*3/uL (ref 4.5–10.5)

## 2012-09-25 NOTE — Addendum Note (Signed)
Addended by: Alvina Chou on: 09/25/2012 12:11 PM   Modules accepted: Orders

## 2012-09-26 ENCOUNTER — Other Ambulatory Visit: Payer: Self-pay | Admitting: Family Medicine

## 2012-09-26 DIAGNOSIS — D696 Thrombocytopenia, unspecified: Secondary | ICD-10-CM

## 2012-09-28 ENCOUNTER — Telehealth: Payer: Self-pay | Admitting: Hematology and Oncology

## 2012-09-28 NOTE — Telephone Encounter (Signed)
Dana Greer has tried to get her a sooner appointment and cannot.  I will fill out FMLA for her until her appointment but I cannot simply "write a note."

## 2012-09-28 NOTE — Telephone Encounter (Signed)
Spoke with patient again and her FMLA is exhausted and she has no time left to take. Per pt she will not return back to work on Monday because she will be fired, pt stated there's no need to make additional appts because she will not have a job. Pt was very upset and angry, screaming in the phone and rude.

## 2012-09-28 NOTE — Telephone Encounter (Signed)
LVOM for pt to return call.  °

## 2012-09-28 NOTE — Telephone Encounter (Signed)
Patient calling back VERY upset about not being written out of work until her appt with the neurologist 11/08/12. I advised that Dr. Dayton Martes could not write that note and pt states " what am I supposed to do" per pt she has severe migraines and nothing is working or helping and she can't drive and can't work with these migraines and now she's throwing up every morning. I asked if the Zofran was helping and she said yes???  Pt would like sooner appt with neurologist and want something done soon. Please advise

## 2012-10-01 ENCOUNTER — Telehealth: Payer: Self-pay | Admitting: Oncology

## 2012-10-01 ENCOUNTER — Encounter: Payer: Self-pay | Admitting: Oncology

## 2012-10-01 ENCOUNTER — Ambulatory Visit (INDEPENDENT_AMBULATORY_CARE_PROVIDER_SITE_OTHER): Payer: 59 | Admitting: Obstetrics and Gynecology

## 2012-10-01 ENCOUNTER — Encounter: Payer: Self-pay | Admitting: Obstetrics and Gynecology

## 2012-10-01 VITALS — BP 118/70 | Temp 98.1°F | Wt 204.0 lb

## 2012-10-01 DIAGNOSIS — N939 Abnormal uterine and vaginal bleeding, unspecified: Secondary | ICD-10-CM

## 2012-10-01 DIAGNOSIS — D6949 Other primary thrombocytopenia: Secondary | ICD-10-CM | POA: Insufficient documentation

## 2012-10-01 DIAGNOSIS — N898 Other specified noninflammatory disorders of vagina: Secondary | ICD-10-CM

## 2012-10-01 DIAGNOSIS — D696 Thrombocytopenia, unspecified: Secondary | ICD-10-CM | POA: Insufficient documentation

## 2012-10-01 DIAGNOSIS — R102 Pelvic and perineal pain: Secondary | ICD-10-CM

## 2012-10-01 DIAGNOSIS — N949 Unspecified condition associated with female genital organs and menstrual cycle: Secondary | ICD-10-CM

## 2012-10-01 HISTORY — DX: Thrombocytopenia, unspecified: D69.6

## 2012-10-01 LAB — POCT URINALYSIS DIPSTICK
Bilirubin, UA: NEGATIVE
Glucose, UA: NEGATIVE
Ketones, UA: NEGATIVE
Leukocytes, UA: NEGATIVE
Nitrite, UA: NEGATIVE
pH, UA: 5

## 2012-10-01 MED ORDER — METRONIDAZOLE 0.75 % VA GEL: 1.0000 | Freq: Every day | VAGINAL | Status: DC

## 2012-10-01 MED ORDER — PROGESTERONE MICRONIZED 100 MG PO CAPS: ORAL_CAPSULE | ORAL | Status: DC

## 2012-10-01 NOTE — Telephone Encounter (Signed)
S/W PT IN REF TO NP APPT ON 10/05/12@3 :00 REFERRING DR Dayton Martes DX-THROMBOCYTOPENIA

## 2012-10-01 NOTE — Progress Notes (Signed)
41 YO S/P SCH (at [facility]-Dr. Luella Cook 11/2011)  being followed by Dr. Su Hilt for vaginal bleeding, incontinence and pelvic pain.  Patient was placed on Enablex, oral progesterone and vicodin for her symptoms but presents to day stating that there pain persists and that she changes her pad twice a day.  States pelvic pain is worse when she bleeds therefore better now-3/10 (was a 5/10 on a 10 point pain scale) . Patient also reports vaginal odor  PMH: PCOS  O: Abdomen: soft with diffuse right sided tenderness without guarding or rebound      Pelvic: EGBUS-wnl, vagina-blood, cervix-no lesions, no palpable masses in pelvis but patient reports tenderness      with exam  U/A- SG 1.020,  pH-5.0, blood 3+ Wet Prep-deferred due to vaginal bleeding  A: Vaginal Bleeding     Pelvic Cramping     Vaginal Odor  P: Metrogel Vaginal #1 tube 1 appl. pv qhs x 5 days      Consulted Dr. Su Hilt who recommended increasing the Progesterone to 200 mg and have      patient follow up in 6-8 weeks      RTO-as scheduled or prn  Daoud Lobue, PA-C

## 2012-10-01 NOTE — Progress Notes (Signed)
When did bleeding start: 3 days ago and pt cramped for about 3 days How  Long: still going  How often changing pad/tampon: not heavy flow. Pt states she has changed about 2 times Bleeding Disorders: no Cramping: yes Contraception: no Fibroids: no Hormone Therapy: no New Medications: enablex and vicodin,rogesterone  Menopausal Symptoms: no Vag. Discharge: no Abdominal Pain: yes Increased Stress: no

## 2012-10-01 NOTE — Telephone Encounter (Signed)
C/D 10/01/12 for appt.10/05/12

## 2012-10-05 ENCOUNTER — Encounter: Payer: Self-pay | Admitting: Oncology

## 2012-10-05 ENCOUNTER — Ambulatory Visit: Payer: 59

## 2012-10-05 ENCOUNTER — Other Ambulatory Visit (HOSPITAL_BASED_OUTPATIENT_CLINIC_OR_DEPARTMENT_OTHER): Payer: 59 | Admitting: Lab

## 2012-10-05 ENCOUNTER — Telehealth: Payer: Self-pay | Admitting: Oncology

## 2012-10-05 ENCOUNTER — Ambulatory Visit (HOSPITAL_BASED_OUTPATIENT_CLINIC_OR_DEPARTMENT_OTHER): Payer: 59 | Admitting: Oncology

## 2012-10-05 VITALS — BP 168/95 | HR 67 | Temp 98.1°F | Resp 20 | Ht 61.5 in | Wt 200.4 lb

## 2012-10-05 DIAGNOSIS — R51 Headache: Secondary | ICD-10-CM

## 2012-10-05 DIAGNOSIS — D696 Thrombocytopenia, unspecified: Secondary | ICD-10-CM

## 2012-10-05 DIAGNOSIS — D6949 Other primary thrombocytopenia: Secondary | ICD-10-CM

## 2012-10-05 DIAGNOSIS — B009 Herpesviral infection, unspecified: Secondary | ICD-10-CM

## 2012-10-05 DIAGNOSIS — F329 Major depressive disorder, single episode, unspecified: Secondary | ICD-10-CM

## 2012-10-05 LAB — CBC WITH DIFFERENTIAL/PLATELET
Basophils Absolute: 0 10*3/uL (ref 0.0–0.1)
EOS%: 1.1 % (ref 0.0–7.0)
Eosinophils Absolute: 0 10*3/uL (ref 0.0–0.5)
HCT: 37.9 % (ref 34.8–46.6)
HGB: 13 g/dL (ref 11.6–15.9)
MCH: 32.8 pg (ref 25.1–34.0)
NEUT#: 2.9 10*3/uL (ref 1.5–6.5)
NEUT%: 63.5 % (ref 38.4–76.8)
RDW: 13.8 % (ref 11.2–14.5)
lymph#: 1.3 10*3/uL (ref 0.9–3.3)

## 2012-10-05 LAB — MORPHOLOGY
PLT EST: DECREASED
RBC Comments: NORMAL

## 2012-10-05 NOTE — Progress Notes (Signed)
Checked in new patient. No financial issues. °

## 2012-10-05 NOTE — Telephone Encounter (Signed)
gv and printed appt schedule for pt for April, July, and [place] 2014

## 2012-10-06 LAB — VITAMIN B12: Vitamin B-12: 477 pg/mL (ref 211–911)

## 2012-10-13 NOTE — Progress Notes (Signed)
[facility]  [phone]) [phone] [phone]     INITIAL HEMATOLOGY CONSULTATION    Referral MD:  Dr. Ruthe Mannan M.D.  Reason for Referral: mild chronic thrombocytopenia.     HPI:  [name] is a 41 year-old woman with history of depression, chronic headache.  She was found to have chronic thrombocytopenia.  She was kindly referred to the [facility] for evaluation.   Dana Greer presented to clinic by herself today.  She reports feeling well.  She has intermittent chronic diffuse headache; not related to work, activity level.  She denied any visible source of bleeding or bruising.   Patient denies fever, anorexia, weight loss, fatigue, visual changes, confusion, drenching night sweats, palpable lymph node swelling, mucositis, odynophagia, dysphagia, nausea vomiting, jaundice, chest pain, palpitation, shortness of breath, dyspnea on exertion, productive cough, gum bleeding, epistaxis, hematemesis, hemoptysis, abdominal pain, abdominal swelling, early satiety, melena, hematochezia, hematuria, skin rash, spontaneous bleeding, joint swelling, joint pain, heat or cold intolerance, bowel bladder incontinence, back pain, focal motor weakness, paresthesia, depression, suicidal or homicidal ideation, feeling hopelessness.     Past Medical History  Diagnosis Date  . Arthritis   . Depression   . Headache   . HSV-2 infection   . Thrombocytopenia 10/01/2012  :    Past Surgical History  Procedure Date  . Partial hysterectomy 2012    due to fibroid  . Wisdom tooth extraction 1995  . Cesarean section 2008    x1  :   CURRENT MEDS: Current Outpatient Prescriptions  Medication Sig Dispense Refill  . ALPRAZolam (XANAX) 1 MG tablet Take 1 mg by mouth as needed.      . darifenacin (ENABLEX) 7.5 MG 24 hr tablet Take 1 tablet (7.5 mg total) by mouth daily.  30 tablet  3  . diazepam (VALIUM) 5 MG tablet Take 1 tablet (5 mg total) by mouth every 8 (eight)  hours as needed (muscle spasm or pain).  10 tablet  0  . HYDROcodone-acetaminophen (NORCO) 10-325 MG per tablet Take 1 tablet by mouth every 8 (eight) hours as needed for pain.  30 tablet  0  . ibuprofen (ADVIL,MOTRIN) 800 MG tablet Take 800 mg by mouth every 8 (eight) hours as needed.      . meclizine (ANTIVERT) 50 MG tablet Take 0.5-1 tablets (25-50 mg total) by mouth 3 (three) times daily as needed for dizziness.  15 tablet  0  . meloxicam (MOBIC) 15 MG tablet Take 15 mg by mouth daily. Take with food      . metroNIDAZOLE (METROGEL VAGINAL) 0.75 % vaginal gel Place 1 Applicatorful vaginally daily. x 5 days  70 g  1  . nortriptyline (PAMELOR) 25 MG capsule Take 25 mg by mouth at bedtime.      . ondansetron (ZOFRAN) 4 MG tablet Take 4 mg by mouth as needed.      . progesterone (PROMETRIUM) 100 MG capsule 1 capsule qhs  30 capsule  3  . rizatriptan (MAXALT) 10 MG tablet Take 10 mg by mouth as needed. May repeat in 2 hours if needed      . valACYclovir (VALTREX) 500 MG tablet Take 500 mg by mouth Daily.      . [DISCONTINUED] darifenacin (ENABLEX) 7.5 MG 24 hr tablet Take 7.5 mg by mouth daily.          Allergies  Allergen Reactions  . Amoxicillin     Yeast infection   :  Family History  Problem  Relation Age of Onset  . Diabetes Father   . Cancer Paternal Aunt     breast  . Cancer Paternal Grandmother     colon CA  :  History   Social History  . Marital Status: Legally Separated    Spouse Name: N/A    Number of Children: 3  . Years of Education: N/A   Occupational History  . Collections, [employer]    Social History Main Topics  . Smoking status: Never Smoker   . Smokeless tobacco: Never Used     Comment: Pt states she smokes.....but "not cigarettes"  . Alcohol Use: Yes     Comment: rarely  . Drug Use: Yes     Comment: marijuana  . Sexually Active: Yes    Birth Control/ Protection: None   Other Topics Concern  . Not on file   Social History Narrative  . No narrative  on file  :  REVIEW OF SYSTEM:  The rest of the 14-point review of sytem was negative.   Exam: ECOG 0.  General:  well-nourished woman, in no acute distress.  Eyes:  no scleral icterus.  ENT:  There were no oropharyngeal lesions.  Neck was without thyromegaly.  Lymphatics:  Negative cervical, supraclavicular or axillary adenopathy.  Respiratory: lungs were clear bilaterally without wheezing or crackles.  Cardiovascular:  Regular rate and rhythm, S1/S2, without murmur, rub or gallop.  There was no pedal edema.  GI:  abdomen was soft, flat, nontender, nondistended, without organomegaly.  Muscoloskeletal:  no spinal tenderness of palpation of vertebral spine.  Skin exam was without echymosis, petichae.  Neuro exam was nonfocal.  Patient was able to get on and off exam table without assistance.  Gait was normal.  Patient was alerted and oriented.  Attention was good.   Language was appropriate.  Mood was normal without depression.  Speech was not pressured.  Thought content was not tangential.    LABS:  Lab Results  Component Value Date   WBC 4.5 10/05/2012   HGB 13.0 10/05/2012   HCT 37.9 10/05/2012   PLT 129* 10/05/2012   GLUCOSE 78 08/23/2012   CHOL 100 07/25/2007   TRIG 31 07/25/2007   HDL 39.5 07/25/2007   LDLCALC 54 07/25/2007   ALT 15 08/23/2012   AST 16 08/23/2012   NA 142 08/23/2012   K 3.9 08/23/2012   CL 108 08/23/2012   CREATININE 1.1 08/23/2012   BUN 14 08/23/2012   CO2 30 08/23/2012   HGBA1C 5.7 07/25/2007    No results found.  Blood smear review:   I personally reviewed the patient's peripheral blood smear today.  There was isocytosis.  There was no peripheral blast.  There was no schistocytosis, spherocytosis, target cell, rouleaux formation, tear drop cell.  There was no giant platelets or platelet clumps.  There was slight decrease in platelet counts.       ASSESSMENT AND PLAN:   1.  Chronic HSV infection:  She is on chronic Valtrex.  2.  Depression:  She is on  alprazolam, nortriptyline.  3.  Chronic headache:  She is on Mobic, Maxalt, Norco.  4.  Chronic thrombocytopenia:  Since 03/2011.   - Differential:  Most likely due to chronic Valtrex.  There is very low clinical suspicion for primary bone marrow failure.  There is no suspicion for destructive process, either autoimmune or non-autoimmune.  I sent TSH and HIV to rule out hypothyroid and HIV as causes of her thrombocytopenia which were both negative.  -  Further work up:  None indicated at this time.  A diagnostic bone marrow biopsy at this time has very low clinical utility.  - Recommendation:  Watchful observation.  She has appointments to follow up at the Cancer in about 4 and 8 months to repeat her CBC and return visit in about 1 year.  In the future, if she has significant thrombocytopenia with platelet <80K or pancytopenia, we may consider bone marrow biopsy at that time.  Dana Greer agreed with watchful observation at this time.     The length of time of the face-to-face encounter was 30 minutes. More than 50% of time was spent counseling and coordination of care.     Thank you for this referral.

## 2012-10-22 ENCOUNTER — Telehealth: Payer: Self-pay | Admitting: Obstetrics and Gynecology

## 2012-10-22 NOTE — Telephone Encounter (Signed)
TC to pt states that she is having breast pain. States that her breast are feeling heavy at all times. States that she was taking progesterone 200 mg states that she has stopped taking this medication.   Darien Ramus, CMA

## 2012-11-14 ENCOUNTER — Telehealth: Payer: Self-pay | Admitting: Obstetrics and Gynecology

## 2012-11-14 NOTE — Telephone Encounter (Signed)
Spoke with pt rgd msg pt wants rx for yeast infec advised pt need ov pt has appt 11/20/12 10:30 with ar pt voice understanding

## 2012-11-20 ENCOUNTER — Encounter: Payer: 59 | Admitting: Obstetrics and Gynecology

## 2012-11-26 ENCOUNTER — Other Ambulatory Visit: Payer: Self-pay | Admitting: Neurology

## 2012-11-26 DIAGNOSIS — R51 Headache: Secondary | ICD-10-CM

## 2012-12-02 ENCOUNTER — Ambulatory Visit
Admission: RE | Admit: 2012-12-02 | Discharge: 2012-12-02 | Disposition: A | Discharge status: Home / Self Care | Payer: 59 | Source: Ambulatory Visit | Attending: Neurology | Admitting: Neurology

## 2012-12-02 DIAGNOSIS — R51 Headache: Secondary | ICD-10-CM

## 2012-12-02 MED ORDER — GADOBENATE DIMEGLUMINE 529 MG/ML IV SOLN
19.0000 mL | Freq: Once | INTRAVENOUS | Status: AC | PRN
  Administered 2012-12-02: 19 mL via INTRAVENOUS

## 2012-12-03 ENCOUNTER — Ambulatory Visit: Payer: 59 | Admitting: Family Medicine

## 2012-12-04 ENCOUNTER — Encounter: Payer: Self-pay | Admitting: Family Medicine

## 2012-12-04 ENCOUNTER — Ambulatory Visit (INDEPENDENT_AMBULATORY_CARE_PROVIDER_SITE_OTHER): Payer: 59 | Admitting: Family Medicine

## 2012-12-04 VITALS — BP 124/90 | HR 80 | Temp 98.1°F | Wt 208.0 lb

## 2012-12-04 DIAGNOSIS — R030 Elevated blood-pressure reading, without diagnosis of hypertension: Secondary | ICD-10-CM

## 2012-12-04 NOTE — Progress Notes (Signed)
Subjective:    Patient ID: Dana Greer, female    DOB: 24-Nov-1970, 42 y.o.   MRN: [id number]  HPI  42 yo pt who I last saw when she transferred care to me in 08/2012, here for ? Elevated BP.  BP 122/86 at headache specialist last month - see note scanned in Epic.    She has been followed by [facility] for migraines. Just had MRA of head on Sunday- results pending.    Here today because she saw her eye doctor who noticed changes in back of her eye consistent with chronic HTN.  She did state that when she was working, she knows her BP was likely elevated- very stressful job.  Has been elevated here in past but was in pain during those readings. Normotensive today.    Patient Active Problem List  Diagnosis  . GOITER NOS  . POLYCYSTIC OVARIAN DISEASE  . REACTION, ACUTE STRESS W/EMOTIONAL DSTURB  . ADJ DISORDER WITH MIXED ANXIETY & DEPRESSED MOOD  . HIRSUTISM  . ARTHROPATHY NOS, MULTIPLE SITES  . BACK PAIN, LUMBAR, CHRONIC  . HYPERHIDROSIS  . HYPERGLYCEMIA  . ELEVATED BLOOD PRESSURE WITHOUT DIAGNOSIS OF HYPERTENSION  . Allergic conjunctivitis  . Sinusitis  . Osteoarthritis  . Female bladder prolapse  . Hypokalemia  . Intractable migraine  . Thrombocytopenia   Past Medical History  Diagnosis Date  . Arthritis   . Depression   . Headache   . HSV-2 infection   . Thrombocytopenia 10/01/2012   Past Surgical History  Procedure Date  . Partial hysterectomy 2012    due to fibroid  . Wisdom tooth extraction 1995  . Cesarean section 2008    x1   History  Substance Use Topics  . Smoking status: Never Smoker   . Smokeless tobacco: Never Used     Comment: Pt states she smokes.....but "not cigarettes"  . Alcohol Use: Yes     Comment: rarely   Family History  Problem Relation Age of Onset  . Diabetes Father   . Cancer Paternal Aunt     breast  . Cancer Paternal Grandmother     colon CA   Allergies  Allergen Reactions  . Amoxicillin     Yeast infection     Current Outpatient Prescriptions on File Prior to Visit  Medication Sig Dispense Refill  . ALPRAZolam (XANAX) 1 MG tablet Take 1 mg by mouth as needed.      . darifenacin (ENABLEX) 7.5 MG 24 hr tablet Take 1 tablet (7.5 mg total) by mouth daily.  30 tablet  3  . diazepam (VALIUM) 5 MG tablet Take 1 tablet (5 mg total) by mouth every 8 (eight) hours as needed (muscle spasm or pain).  10 tablet  0  . HYDROcodone-acetaminophen (NORCO) 10-325 MG per tablet Take 1 tablet by mouth every 8 (eight) hours as needed for pain.  30 tablet  0  . ibuprofen (ADVIL,MOTRIN) 800 MG tablet Take 800 mg by mouth every 8 (eight) hours as needed.      . meclizine (ANTIVERT) 50 MG tablet Take 0.5-1 tablets (25-50 mg total) by mouth 3 (three) times daily as needed for dizziness.  15 tablet  0  . meloxicam (MOBIC) 15 MG tablet Take 15 mg by mouth daily. Take with food      . metroNIDAZOLE (METROGEL VAGINAL) 0.75 % vaginal gel Place 1 Applicatorful vaginally daily. x 5 days  70 g  1  . nortriptyline (PAMELOR) 25 MG capsule Take 25 mg by mouth  at bedtime.      . ondansetron (ZOFRAN) 4 MG tablet Take 4 mg by mouth as needed.      . progesterone (PROMETRIUM) 100 MG capsule 1 capsule qhs  30 capsule  3  . rizatriptan (MAXALT) 10 MG tablet Take 10 mg by mouth as needed. May repeat in 2 hours if needed      . valACYclovir (VALTREX) 500 MG tablet Take 500 mg by mouth Daily.      . [DISCONTINUED] darifenacin (ENABLEX) 7.5 MG 24 hr tablet Take 7.5 mg by mouth daily.       The PMH, PSH, Social History, Family History, Medications, and allergies have been reviewed in [facility], and have been updated if relevant.    Review of Systems See HPI    Objective:   Physical Exam BP 124/90  Pulse 80  Temp 98.1 F (36.7 C)  Wt 208 lb (94.348 kg)  Constitutional: She appears well-developed and well-nourished. No distress.  HENT:   Skin: She is not diaphoretic.     Assessment & Plan:   1.  Elevated Blood pressure- >15 min spent  with face to face with patient, >50% counseling and/or coordinating care.  She is normotensive here and at her visit with neurology last month. I do not want to start an antihypertensive when she is normotensive because this will lead to hypotension, fatigue, syncope, etc.  She will check BPs at home and call me if they go up again.  I suspect, as does the patient, that BP was previously elevated due to her stressful work environment.

## 2012-12-04 NOTE — Patient Instructions (Addendum)
Great to see you. We are not starting blood pressure medication because your blood pressure has been normal.  Keep an eye on it and call me if it does go up again.

## 2012-12-06 ENCOUNTER — Other Ambulatory Visit: Payer: Self-pay | Admitting: *Deleted

## 2012-12-06 MED ORDER — HYDROCODONE-ACETAMINOPHEN 10-325 MG PO TABS: 1.0000 | ORAL_TABLET | Freq: Three times a day (TID) | ORAL | Status: DC | PRN

## 2012-12-06 NOTE — Telephone Encounter (Signed)
[facility] [place] to [facility]. 

## 2012-12-18 ENCOUNTER — Telehealth: Payer: Self-pay

## 2012-12-18 NOTE — Telephone Encounter (Signed)
Spoke to pt to ask ?'s re: info that we need to gather to get her Enablex prior authorized. Form will be filled out and faxed in to her Ins. Company tonight. Melody Comas A

## 2012-12-24 ENCOUNTER — Telehealth: Payer: Self-pay

## 2012-12-24 NOTE — Telephone Encounter (Signed)
LM for pt that Rx for Enablex was denied. Pt may call the # on the back of her insurance card to see what else she can do to see about appealing this decision. Melody Comas A

## 2012-12-25 ENCOUNTER — Telehealth: Payer: Self-pay | Admitting: Obstetrics and Gynecology

## 2012-12-25 NOTE — Telephone Encounter (Signed)
Spoke to pt to get her scheduled for eval w/ Dr. Su Hilt on 01/01/2013. Melody Comas A

## 2012-12-25 NOTE — Telephone Encounter (Signed)
Pt returning your call

## 2013-01-01 ENCOUNTER — Encounter: Payer: Self-pay | Admitting: Obstetrics and Gynecology

## 2013-01-01 ENCOUNTER — Ambulatory Visit: Payer: 59 | Admitting: Obstetrics and Gynecology

## 2013-01-01 VITALS — BP 120/84 | Resp 16 | Wt 203.0 lb

## 2013-01-01 MED ORDER — TINIDAZOLE 500 MG PO TABS: 2.0000 g | ORAL_TABLET | Freq: Every day | ORAL | Status: DC

## 2013-01-01 NOTE — Progress Notes (Signed)
Here for some other method to help with her bleeding and pain.  Several options discussed.  Pt had small vessel infarct? on MRI that was old (not a depo lupron candidate).  Pt has had implanon twice before and liked it.  Pt had a Lap [facility] about a yr ago with an outside office but she still has periods.  Filed Vitals:   01/01/13 1411  BP: 120/84  Resp: 16   ROS: noncontributory  Pelvic exam:  VULVA: normal appearing vulva with no masses, tenderness or lesions,  VAGINA: normal appearing vagina with normal color and discharge, no lesions, CERVIX: normal appearing cervix without discharge or lesions,  ADNEXA: normal adnexa in size, nontender and no masses.  A/P Perimenopausal Will try nexplanon next available (SE discussed incl possible wt gain)

## 2013-01-08 NOTE — Telephone Encounter (Signed)
Note to close enc. Dana Greer, Dana Greer  

## 2013-01-30 ENCOUNTER — Encounter: Payer: Self-pay | Admitting: Oncology

## 2013-01-30 ENCOUNTER — Other Ambulatory Visit: Payer: 59 | Admitting: Lab

## 2013-01-30 DIAGNOSIS — D696 Thrombocytopenia, unspecified: Secondary | ICD-10-CM

## 2013-01-30 LAB — CBC WITH DIFFERENTIAL/PLATELET
Basophils Absolute: 0 10*3/uL (ref 0.0–0.1)
Eosinophils Absolute: 0.1 10*3/uL (ref 0.0–0.5)
HCT: 37.2 % (ref 34.8–46.6)
HGB: 12.6 g/dL (ref 11.6–15.9)
MCV: 96.5 fL (ref 79.5–101.0)
MONO%: 5.5 % (ref 0.0–14.0)
NEUT#: 5.7 10*3/uL (ref 1.5–6.5)
NEUT%: 73.1 % (ref 38.4–76.8)
Platelets: 119 10*3/uL — ABNORMAL LOW (ref 145–400)
RDW: 13.8 % (ref 11.2–14.5)

## 2013-02-01 ENCOUNTER — Telehealth: Payer: Self-pay

## 2013-02-01 NOTE — Telephone Encounter (Signed)
Pt said Dr Carr,psychiatrist has taken pt out of work; pt wants to know status of medical record release to [employer] financial for disability leave. 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 Pt appreciated answer.

## 2013-03-06 ENCOUNTER — Telehealth: Payer: Self-pay | Admitting: Family Medicine

## 2013-03-06 NOTE — Telephone Encounter (Signed)
Debbie from [name] faxed a Short Term Disability Provider Update Questionnaire form to be filled out.  The form is in your in box.

## 2013-03-07 NOTE — Telephone Encounter (Signed)
Pt needs appt to discuss

## 2013-03-07 NOTE — Telephone Encounter (Signed)
Advised patient, she said she is not at home and will call when she gets back to schedule appt.  I am holding form until she comes in.

## 2013-03-14 ENCOUNTER — Ambulatory Visit: Payer: 59 | Admitting: Family Medicine

## 2013-03-14 DIAGNOSIS — Z0289 Encounter for other administrative examinations: Secondary | ICD-10-CM

## 2013-03-26 ENCOUNTER — Encounter (HOSPITAL_COMMUNITY): Payer: Self-pay

## 2013-03-26 ENCOUNTER — Encounter: Payer: Self-pay | Admitting: Radiology

## 2013-03-26 ENCOUNTER — Emergency Department (HOSPITAL_COMMUNITY): Payer: 59

## 2013-03-26 ENCOUNTER — Emergency Department (HOSPITAL_COMMUNITY)
Admission: EM | Admit: 2013-03-26 | Discharge: 2013-03-26 | Disposition: A | Discharge status: Home / Self Care | Payer: 59 | Attending: Emergency Medicine | Admitting: Emergency Medicine

## 2013-03-26 ENCOUNTER — Telehealth: Payer: Self-pay | Admitting: Oncology

## 2013-03-26 DIAGNOSIS — Y939 Activity, unspecified: Secondary | ICD-10-CM | POA: Insufficient documentation

## 2013-03-26 DIAGNOSIS — Y929 Unspecified place or not applicable: Secondary | ICD-10-CM | POA: Insufficient documentation

## 2013-03-26 DIAGNOSIS — M533 Sacrococcygeal disorders, not elsewhere classified: Secondary | ICD-10-CM

## 2013-03-26 DIAGNOSIS — IMO0002 Reserved for concepts with insufficient information to code with codable children: Secondary | ICD-10-CM | POA: Insufficient documentation

## 2013-03-26 DIAGNOSIS — M129 Arthropathy, unspecified: Secondary | ICD-10-CM | POA: Insufficient documentation

## 2013-03-26 DIAGNOSIS — F3289 Other specified depressive episodes: Secondary | ICD-10-CM | POA: Insufficient documentation

## 2013-03-26 DIAGNOSIS — M1990 Unspecified osteoarthritis, unspecified site: Secondary | ICD-10-CM

## 2013-03-26 DIAGNOSIS — B009 Herpesviral infection, unspecified: Secondary | ICD-10-CM | POA: Insufficient documentation

## 2013-03-26 DIAGNOSIS — Z880 Allergy status to penicillin: Secondary | ICD-10-CM | POA: Insufficient documentation

## 2013-03-26 DIAGNOSIS — F329 Major depressive disorder, single episode, unspecified: Secondary | ICD-10-CM | POA: Insufficient documentation

## 2013-03-26 DIAGNOSIS — Z862 Personal history of diseases of the blood and blood-forming organs and certain disorders involving the immune mechanism: Secondary | ICD-10-CM | POA: Insufficient documentation

## 2013-03-26 DIAGNOSIS — Z79899 Other long term (current) drug therapy: Secondary | ICD-10-CM | POA: Insufficient documentation

## 2013-03-26 DIAGNOSIS — R296 Repeated falls: Secondary | ICD-10-CM | POA: Insufficient documentation

## 2013-03-26 MED ORDER — HYDROCODONE-ACETAMINOPHEN 5-325 MG PO TABS: 1.0000 | ORAL_TABLET | ORAL | Status: DC | PRN

## 2013-03-26 NOTE — ED Provider Notes (Signed)
Medical screening examination/treatment/procedure(s) were performed by non-physician practitioner and as supervising physician I was immediately available for consultation/collaboration.  Marieliz Strang J. Mack Thurmon, MD 03/26/13 1141 

## 2013-03-26 NOTE — ED Provider Notes (Signed)
History     CSN: [id number]  Arrival date & time 03/26/13  1010   First MD Initiated Contact with Patient 03/26/13 1034      Chief Complaint  Patient presents with  . Tailbone Pain    (Consider location/radiation/quality/duration/timing/severity/associated sxs/prior treatment) HPI Comments: 42 year old female with a past medical history of arthritis and depression presents to the emergency department complaining of tailbone pain since May 6 after a fall. Patient states at that time she fell face first, however since then her tailbone has been hurting her. Pain described as throbbing, radiating to right below her lower back. Bending forward or sitting directly on her bottom but the pain worse, relieved by laying on her side. She tried taking the Vicodin last night which put her to sleep, unsure if it helped with the pain. Denies difficulty moving her bowels. Normal bowel movements. No changes in urination. Denies abdominal pain, nausea, vomiting, fever or chills.  The history is provided by the patient.    Past Medical History  Diagnosis Date  . Arthritis   . Depression   . Headache   . HSV-2 infection   . Thrombocytopenia 10/01/2012    Past Surgical History  Procedure Laterality Date  . Partial hysterectomy  2012    due to fibroid  . Wisdom tooth extraction  1995  . Cesarean section  2008    x1    Family History  Problem Relation Age of Onset  . Diabetes Father   . Cancer Paternal Aunt     breast  . Cancer Paternal Grandmother     colon CA    History  Substance Use Topics  . Smoking status: Never Smoker   . Smokeless tobacco: Never Used     Comment: Pt states she smokes.....but "not cigarettes"  . Alcohol Use: Yes     Comment: rarely    OB History   Grav Para Term Preterm Abortions TAB SAB Ect Mult Living   3         3     Obstetric Comments   Pt states that she has had a lot of miscarriages. Do not know how many      Review of Systems  Constitutional:  Negative for fever and chills.  Gastrointestinal: Negative for nausea, vomiting, blood in stool and rectal pain.  Genitourinary: Negative for dysuria and difficulty urinating.  Musculoskeletal:       Positive for tailbone pain.  Skin: Negative for color change.  All other systems reviewed and are negative.    Allergies  Amoxicillin  Home Medications   Current Outpatient Rx  Name  Route  Sig  Dispense  Refill  . ALPRAZolam (XANAX) 1 MG tablet   Oral   Take 1 mg by mouth as needed.         . diazepam (VALIUM) 5 MG tablet   Oral   Take 1 tablet (5 mg total) by mouth every 8 (eight) hours as needed (muscle spasm or pain).   10 tablet   0   . Drospirenone-Ethinyl Estradiol-Levomefol (SAFYRAL) 3-0.03-0.451 MG tablet   Oral   Take 1 tablet by mouth daily.         Marland Kitchen HYDROcodone-acetaminophen (NORCO) 10-325 MG per tablet   Oral   Take 1 tablet by mouth every 8 (eight) hours as needed for pain.   30 tablet   0   . ibuprofen (ADVIL,MOTRIN) 800 MG tablet   Oral   Take 800 mg by mouth every 8 (eight)  hours as needed.         . meclizine (ANTIVERT) 50 MG tablet   Oral   Take 0.5-1 tablets (25-50 mg total) by mouth 3 (three) times daily as needed for dizziness.   15 tablet   0   . meloxicam (MOBIC) 15 MG tablet   Oral   Take 15 mg by mouth daily. Take with food         . nortriptyline (PAMELOR) 25 MG capsule   Oral   Take 25 mg by mouth at bedtime.         . ondansetron (ZOFRAN) 4 MG tablet   Oral   Take 4 mg by mouth as needed.         Marland Kitchen PARoxetine (PAXIL) 40 MG tablet   Oral   Take 40 mg by mouth 2 (two) times daily.         . progesterone (PROMETRIUM) 100 MG capsule      1 capsule qhs   30 capsule   3   . rizatriptan (MAXALT) 10 MG tablet   Oral   Take 10 mg by mouth as needed. May repeat in 2 hours if needed         . tinidazole (TINDAMAX) 500 MG tablet   Oral   Take 4 tablets (2,000 mg total) by mouth daily. For 2 days.   8 tablet    0   . valACYclovir (VALTREX) 500 MG tablet   Oral   Take 500 mg by mouth Daily.           BP 148/84  Pulse 53  Temp(Src) 98.7 F (37.1 C) (Oral)  Resp 20  Ht 5\' 2"  (1.575 m)  Wt 198 lb (89.812 kg)  BMI 36.21 kg/m2  SpO2 100%  Physical Exam  Nursing note and vitals reviewed. Constitutional: She is oriented to person, place, and time. She appears well-developed and well-nourished. No distress.  HENT:  Head: Normocephalic and atraumatic.  Mouth/Throat: Oropharynx is clear and moist.  Eyes: Conjunctivae are normal.  Neck: Normal range of motion. Neck supple.  Cardiovascular: Normal rate, regular rhythm and normal heart sounds.   Pulmonary/Chest: Effort normal and breath sounds normal.  Abdominal: Soft. Bowel sounds are normal. There is no tenderness.  Musculoskeletal: Normal range of motion. She exhibits no edema.  Tenderness of lower 1/4 of sacrum to coccyx. No overlying edema, erythema, drainage, abscess. Normal gait.  Neurological: She is alert and oriented to person, place, and time.  Skin: Skin is warm and dry. She is not diaphoretic. No erythema.  No abscess, cellulitis.  Psychiatric: She has a normal mood and affect. Her behavior is normal.    ED Course  Procedures (including critical care time)  Labs Reviewed - No data to display Dg Sacrum/coccyx  03/26/2013   *RADIOLOGY REPORT*  Clinical Data: Tail bone pain.  No recent injury.  SACRUM AND COCCYX - 2+ VIEW  Comparison: CT 06/08/2012  Findings: Mild sclerosis around the inferior SI joints bilaterally suggests sacroiliitis.  This is unchanged prior CT.  No acute findings.  No fracture.  Normal alignment in the visualized lower lumbar spine.  IMPRESSION: Stable changes of bilateral sacroiliitis.  No acute findings.   Original Report Authenticated By: Charlett Nose, M.D.     1. Coccydynia   2. Arthritis       MDM  42 y/o female with coccydynia, arthritis. Xray without acute abnormality. PE unremarkable other than  tenderness. No overlying edema, erythema, abscess. Afebrile, NAD. Discharged  with pain control. She has f/u with PCP tomorrow. Advised rest, ice. Patient states understanding of plan and is agreeable.         Trevor Mace, PA-C 03/26/13 1133

## 2013-03-26 NOTE — Telephone Encounter (Signed)
Faxed pt medical records to [facility]riev[facility]

## 2013-03-26 NOTE — ED Notes (Signed)
Patient reports that she fell 03/05/13, but fell face first. Patient states she has had tailbone pain since then and has gotten progresssively worse.

## 2013-03-27 ENCOUNTER — Encounter: Payer: Self-pay | Admitting: Family Medicine

## 2013-03-27 ENCOUNTER — Ambulatory Visit (INDEPENDENT_AMBULATORY_CARE_PROVIDER_SITE_OTHER): Payer: 59 | Admitting: Family Medicine

## 2013-03-27 VITALS — BP 140/90 | HR 68 | Temp 98.2°F | Wt 195.0 lb

## 2013-03-27 DIAGNOSIS — M533 Sacrococcygeal disorders, not elsewhere classified: Secondary | ICD-10-CM | POA: Insufficient documentation

## 2013-03-27 MED ORDER — DEXAMETHASONE SOD PHOSPHATE PF 10 MG/ML IJ SOLN
10.0000 mg | Freq: Once | INTRAMUSCULAR | Status: AC
  Administered 2013-03-27: 10 mg via INTRAMUSCULAR

## 2013-03-27 NOTE — Addendum Note (Signed)
Addended by: Eliezer Bottom on: 03/27/2013 11:29 AM   Modules accepted: Orders

## 2013-03-27 NOTE — Progress Notes (Signed)
42 year old pleasant female with a past medical history of arthritis and depression presents here for ER follow up.  Notes reviewed.  Went to [employer] yesterday for tailbone pain since May 6 after a fall. Patient states at that time she fell face first, however since then her tailbone has been hurting her. Did not fall on her bottom.  Pain described as throbbing, radiating to right below her lower back. Bending forward or sitting directly on her bottom but the pain worse, relieved by laying on her side.  Denies difficulty moving her bowels. Normal bowel movements. No changes in urination. Denies abdominal pain, nausea, vomiting, fever or chills.   Patient Active Problem List   Diagnosis Date Noted  . Coccyx pain 03/27/2013  . Thrombocytopenia 10/01/2012  . Intractable migraine 08/31/2012  . Osteoarthritis 05/11/2012  . Female bladder prolapse 05/11/2012  . Hypokalemia 05/11/2012  . Sinusitis 10/31/2011  . Allergic conjunctivitis 08/11/2011  . ADJ DISORDER WITH MIXED ANXIETY & DEPRESSED MOOD 06/30/2008  . HYPERHIDROSIS 01/25/2008  . BACK PAIN, LUMBAR, CHRONIC 10/16/2007  . REACTION, ACUTE STRESS W/EMOTIONAL DSTURB 08/01/2007  . HYPERGLYCEMIA 08/01/2007  . ELEVATED BLOOD PRESSURE WITHOUT DIAGNOSIS OF HYPERTENSION 08/01/2007  . GOITER NOS 07/11/2007  . POLYCYSTIC OVARIAN DISEASE 05/24/2007  . HIRSUTISM 05/24/2007  . ARTHROPATHY NOS, MULTIPLE SITES 05/24/2007   Past Medical History  Diagnosis Date  . Arthritis   . Depression   . Headache(784.0)   . HSV-2 infection   . Thrombocytopenia 10/01/2012   Past Surgical History  Procedure Laterality Date  . Partial hysterectomy  2012    due to fibroid  . Wisdom tooth extraction  1995  . Cesarean section  2008    x1   History  Substance Use Topics  . Smoking status: Never Smoker   . Smokeless tobacco: Never Used     Comment: Pt states she smokes.....but "not cigarettes"  . Alcohol Use: Yes     Comment: rarely   Family History  Problem  Relation Age of Onset  . Diabetes Father   . Cancer Paternal Aunt     breast  . Cancer Paternal Grandmother     colon CA   Allergies  Allergen Reactions  . Amoxicillin     Yeast infection    Current Outpatient Prescriptions on File Prior to Visit  Medication Sig Dispense Refill  . meclizine (ANTIVERT) 50 MG tablet Take 0.5-1 tablets (25-50 mg total) by mouth 3 (three) times daily as needed for dizziness.  15 tablet  0  . oxybutynin (OXYTROL) 3.9 MG/24HR Place 1 patch onto the skin 2 (two) times a week. Wednesday sunday      . PARoxetine (PAXIL) 40 MG tablet Take 40 mg by mouth 2 (two) times daily.      Marland Kitchen ALPRAZolam (XANAX) 1 MG tablet Take 1 mg by mouth as needed.      Marland Kitchen HYDROcodone-acetaminophen (NORCO) 10-325 MG per tablet Take 1 tablet by mouth every 8 (eight) hours as needed for pain.  30 tablet  0  . HYDROcodone-acetaminophen (NORCO/VICODIN) 5-325 MG per tablet Take 1-2 tablets by mouth every 4 (four) hours as needed for pain.  10 tablet  0  . [DISCONTINUED] darifenacin (ENABLEX) 7.5 MG 24 hr tablet Take 7.5 mg by mouth daily.       No current facility-administered medications on file prior to visit.   The PMH, PSH, Social History, Family History, Medications, and allergies have been reviewed in [facility], and have been updated if relevant.   Dg Sacrum/coccyx  03/26/2013   *RADIOLOGY REPORT*  Clinical Data: Tail bone pain.  No recent injury.  SACRUM AND COCCYX - 2+ VIEW  Comparison: CT 06/08/2012  Findings: Mild sclerosis around the inferior SI joints bilaterally suggests sacroiliitis.  This is unchanged prior CT.  No acute findings.  No fracture.  Normal alignment in the visualized lower lumbar spine.  IMPRESSION: Stable changes of bilateral sacroiliitis.  No acute findings.   Original Report Authenticated By: Charlett Nose, M.D.    BP 148/84  Pulse 53  Temp(Src) 98.7 F (37.1 C) (Oral)  Resp 20  Ht 5\' 2"  (1.575 m)  Wt 198 lb (89.812 kg)  BMI 36.21 kg/m2  SpO2 100%  Physical  Exam  BP 140/90  Pulse 68  Temp(Src) 98.2 F (36.8 C)  Wt 195 lb (88.451 kg)  BMI 35.66 kg/m2  Constitutional: She is oriented to person, place, and time. She appears well-developed and well-nourished. No distress.  HENT:  Head: Normocephalic and atraumatic.  Mouth/Throat: Oropharynx is clear and moist.  Eyes: Conjunctivae are normal.  Neck: Normal range of motion. Neck supple.  Cardiovascular: Normal rate, regular rhythm and normal heart sounds.  Pulmonary/Chest: Effort normal and breath sounds normal.  Abdominal: Soft. Bowel sounds are normal. There is no tenderness.  Musculoskeletal: Normal range of motion. She exhibits no edema.  Tenderness of lower 1/4 of sacrum to coccyx. No overlying edema, erythema, drainage, abscess. Normal gait.  Neurological: She is alert and oriented to person, place, and time.  Skin: Skin is warm and dry. She is not diaphoretic. No erythema.  Psychiatric: She has a normal mood and affect. Her behavior is normal.   Assessment and Plan: 1. Coccyx pain See below.  2. Sacroiliac dysfunction Due to chronic sacroilitis.  Will refer to PT.  IM decadron given in office.  See AVS for supportive care. Call or return to clinic prn if these symptoms worsen or fail to improve as anticipated. The patient indicates understanding of these issues and agrees with the plan.  - Ambulatory referral to Physical Therapy

## 2013-03-27 NOTE — Patient Instructions (Addendum)
Sacroiliac Joint Dysfunction The sacroiliac joint connects the lower part of the spine (the sacrum) with the bones of the pelvis. CAUSES  Sometimes, there is no obvious reason for sacroiliac joint dysfunction. Other times, it may occur   During pregnancy.  After injury, such as:  Car accidents.  Sport-related injuries.  Work-related injuries.  Due to one leg being shorter than the other.  Due to other conditions that affect the joints, such as:  Rheumatoid arthritis.  Gout.  Psoriasis.  Joint infection (septic arthritis). SYMPTOMS  Symptoms may include:  Pain in the:  Lower back.  Buttocks.  Groin.  Thighs and legs.  Difficult sitting, standing, walking, lying, bending or lifting. DIAGNOSIS  A number of tests may be used to help diagnose the cause of sacroiliac joint dysfunction, including:  Imaging tests to look for other causes of pain, including:  MRI.  CT scan.  Bone scan.  Diagnostic injection: During a special x-ray (called fluoroscopy), a needle is put into the sacroiliac joint. A numbing medicine is injected into the joint. If the pain is improved or stopped, the diagnosis of sacroiliac joint dysfunction is more likely. TREATMENT  There are a number of types of treatment used for sacroiliac joint dysfunction, including:  Only take over-the-counter or prescription medicines for pain, discomfort, or fever as directed by your caregiver.  Medications to relax muscles.  Rest. Decreasing activity can help cut down on painful muscle spasms and allow the back to heal.  Application of heat or ice to the lower back may improve muscle spasms and soothe pain.  Brace. A special back brace, called a sacroiliac belt, can help support the joint while your back is healing.  Physical therapy can help teach comfortable positions and exercises to strengthen muscles that support the sacroiliac joint.  Cortisone injections. Injections of steroid medicine into the  joint can help decrease swelling and improve pain.  Hyaluronic acid injections. This chemical improves lubrication within the sacroiliac joint, thereby decreasing pain.  Radiofrequency ablation. A special needle is placed into the joint, where it burns away nerves that are carrying pain messages from the joint.  Surgery. Because pain occurs during movement of the joint, screws and plates may be installed in order to limit or prevent joint motion. HOME CARE INSTRUCTIONS   Take all medications exactly as directed.  Follow instructions regarding both rest and physical activity, to avoid worsening the pain.  Do physical therapy exercises exactly as prescribed. SEEK IMMEDIATE MEDICAL CARE IF:  You experience increasingly severe pain.  You develop new symptoms, such as numbness or tingling in your legs or feet.  You lose bladder or bowel control. Document Released: 01/13/2009 Document Revised: 01/09/2012 Document Reviewed: 01/13/2009 [facility] Patient Information 2014 [place], [place].  Please stop by to see Shirlee Limerick on your way out to set up your referral.

## 2013-03-29 ENCOUNTER — Telehealth: Payer: Self-pay

## 2013-03-29 MED ORDER — MELOXICAM 15 MG PO TABS: 15.0000 mg | ORAL_TABLET | Freq: Every day | ORAL | Status: DC

## 2013-03-29 NOTE — Telephone Encounter (Signed)
Pt left v/m; was seen on 03/27/13 and still in a lot of pain when sitting; pt does not like to take pain med and wants to know if can stop pain med and get medication for inflammation that is causing the back pain.Pain level now is 8. [facility].

## 2013-03-29 NOTE — Telephone Encounter (Signed)
Advised patient of prescription.  She states she doesn't want meloxicam because there is a small risk of stroke with it.  She is asking that you change this to something else.  Script cancelled at [employer] [facility].

## 2013-03-29 NOTE — Telephone Encounter (Signed)
I would recommend any other OTC NSAID like Alleve or Ibuprofen.

## 2013-03-29 NOTE — Telephone Encounter (Signed)
Advised patient

## 2013-03-29 NOTE — Telephone Encounter (Signed)
Meloxicam rx sent to her pharmacy.  Please take with food, proceed with PT.  Keep [place] posted with symptoms.

## 2013-04-08 ENCOUNTER — Encounter: Payer: Self-pay | Admitting: Family Medicine

## 2013-05-20 ENCOUNTER — Telehealth: Payer: Self-pay | Admitting: Oncology

## 2013-05-20 NOTE — Telephone Encounter (Signed)
Pt called very upset. Pt needs her medical records faxed to [employer].  I faxed pt medical records to [facility].

## 2013-05-22 ENCOUNTER — Other Ambulatory Visit: Payer: 59 | Admitting: Lab

## 2013-05-23 ENCOUNTER — Other Ambulatory Visit: Payer: Self-pay | Admitting: *Deleted

## 2013-06-04 ENCOUNTER — Telehealth: Payer: Self-pay | Admitting: Hematology and Oncology

## 2013-06-04 NOTE — Telephone Encounter (Signed)
Returned pt's call re r/s 7/23 lb appt. Per pt she had a death in the family and completely forgot lb appt. lmonvm for pt re new appt for 8/8 @ 8:45am and asked that pt call to r/s if she cannot keep appt.

## 2013-06-07 ENCOUNTER — Other Ambulatory Visit (HOSPITAL_BASED_OUTPATIENT_CLINIC_OR_DEPARTMENT_OTHER): Payer: 59

## 2013-06-07 ENCOUNTER — Telehealth: Payer: Self-pay

## 2013-06-07 DIAGNOSIS — D696 Thrombocytopenia, unspecified: Secondary | ICD-10-CM

## 2013-06-07 LAB — CBC WITH DIFFERENTIAL/PLATELET
BASO%: 0.2 % (ref 0.0–2.0)
EOS%: 2.9 % (ref 0.0–7.0)
MCH: 32 pg (ref 25.1–34.0)
MCHC: 33.5 g/dL (ref 31.5–36.0)
RDW: 13.5 % (ref 11.2–14.5)
lymph#: 1.9 10*3/uL (ref 0.9–3.3)

## 2013-06-07 NOTE — Telephone Encounter (Signed)
Message copied by Kallie Locks on Fri Jun 07, 2013 11:47 AM ------      Message from: Myrtis Ser      Created: Fri Jun 07, 2013 11:17 AM       Please call pt. Plt are normal. Recommend continued observation. ------

## 2013-06-11 ENCOUNTER — Encounter (HOSPITAL_COMMUNITY): Payer: Self-pay | Admitting: *Deleted

## 2013-06-11 DIAGNOSIS — Z79899 Other long term (current) drug therapy: Secondary | ICD-10-CM | POA: Insufficient documentation

## 2013-06-11 DIAGNOSIS — Z90710 Acquired absence of both cervix and uterus: Secondary | ICD-10-CM | POA: Insufficient documentation

## 2013-06-11 DIAGNOSIS — R079 Chest pain, unspecified: Secondary | ICD-10-CM | POA: Insufficient documentation

## 2013-06-11 DIAGNOSIS — F3289 Other specified depressive episodes: Secondary | ICD-10-CM | POA: Insufficient documentation

## 2013-06-11 DIAGNOSIS — Z8673 Personal history of transient ischemic attack (TIA), and cerebral infarction without residual deficits: Secondary | ICD-10-CM | POA: Insufficient documentation

## 2013-06-11 DIAGNOSIS — Z8739 Personal history of other diseases of the musculoskeletal system and connective tissue: Secondary | ICD-10-CM | POA: Insufficient documentation

## 2013-06-11 DIAGNOSIS — R197 Diarrhea, unspecified: Secondary | ICD-10-CM | POA: Insufficient documentation

## 2013-06-11 DIAGNOSIS — K219 Gastro-esophageal reflux disease without esophagitis: Secondary | ICD-10-CM | POA: Insufficient documentation

## 2013-06-11 DIAGNOSIS — Z8639 Personal history of other endocrine, nutritional and metabolic disease: Secondary | ICD-10-CM | POA: Insufficient documentation

## 2013-06-11 DIAGNOSIS — Z9889 Other specified postprocedural states: Secondary | ICD-10-CM | POA: Insufficient documentation

## 2013-06-11 DIAGNOSIS — Z862 Personal history of diseases of the blood and blood-forming organs and certain disorders involving the immune mechanism: Secondary | ICD-10-CM | POA: Insufficient documentation

## 2013-06-11 DIAGNOSIS — F329 Major depressive disorder, single episode, unspecified: Secondary | ICD-10-CM | POA: Insufficient documentation

## 2013-06-11 DIAGNOSIS — Z8619 Personal history of other infectious and parasitic diseases: Secondary | ICD-10-CM | POA: Insufficient documentation

## 2013-06-11 LAB — COMPREHENSIVE METABOLIC PANEL
ALT: 24 U/L (ref 0–35)
Albumin: 3.8 g/dL (ref 3.5–5.2)
Alkaline Phosphatase: 42 U/L (ref 39–117)
BUN: 14 mg/dL (ref 6–23)
Chloride: 104 mEq/L (ref 96–112)
Glucose, Bld: 98 mg/dL (ref 70–99)
Potassium: 3.9 mEq/L (ref 3.5–5.1)
Total Bilirubin: 0.3 mg/dL (ref 0.3–1.2)

## 2013-06-11 LAB — CBC WITH DIFFERENTIAL/PLATELET
Basophils Relative: 0 % (ref 0–1)
Hemoglobin: 14.2 g/dL (ref 12.0–15.0)
Lymphs Abs: 2.6 10*3/uL (ref 0.7–4.0)
Monocytes Relative: 5 % (ref 3–12)
Neutro Abs: 3.9 10*3/uL (ref 1.7–7.7)
Neutrophils Relative %: 56 % (ref 43–77)
RBC: 4.3 MIL/uL (ref 3.87–5.11)

## 2013-06-11 LAB — POCT I-STAT TROPONIN I: Troponin i, poc: 0.01 ng/mL (ref 0.00–0.08)

## 2013-06-11 NOTE — ED Notes (Signed)
The pt is c/o indigestion nausea and dizziness for 3-4 days.

## 2013-06-12 ENCOUNTER — Emergency Department (HOSPITAL_COMMUNITY)
Admission: EM | Admit: 2013-06-12 | Discharge: 2013-06-12 | Disposition: A | Discharge status: Home / Self Care | Payer: 59 | Attending: Emergency Medicine | Admitting: Emergency Medicine

## 2013-06-12 ENCOUNTER — Emergency Department (HOSPITAL_COMMUNITY): Payer: 59

## 2013-06-12 DIAGNOSIS — R197 Diarrhea, unspecified: Secondary | ICD-10-CM

## 2013-06-12 DIAGNOSIS — K219 Gastro-esophageal reflux disease without esophagitis: Secondary | ICD-10-CM

## 2013-06-12 HISTORY — DX: Cerebral infarction, unspecified: I63.9

## 2013-06-12 HISTORY — DX: Immunodeficiency, unspecified: D84.9

## 2013-06-12 MED ORDER — GI COCKTAIL ~~LOC~~
30.0000 mL | Freq: Once | ORAL | Status: AC
  Administered 2013-06-12: 30 mL via ORAL

## 2013-06-12 MED ORDER — GI COCKTAIL ~~LOC~~
ORAL | Status: AC
  Filled 2013-06-12: qty 30

## 2013-06-12 MED ORDER — SUCRALFATE 1 GM/10ML PO SUSP: 1.0000 g | Freq: Four times a day (QID) | ORAL | Status: DC

## 2013-06-12 MED ORDER — ONDANSETRON 4 MG PO TBDP
ORAL_TABLET | ORAL | Status: AC
  Administered 2013-06-12: 4 mg
  Filled 2013-06-12: qty 1

## 2013-06-12 MED ORDER — OMEPRAZOLE 20 MG PO CPDR: 20.0000 mg | DELAYED_RELEASE_CAPSULE | Freq: Every day | ORAL | Status: DC

## 2013-06-12 NOTE — ED Provider Notes (Signed)
CSN: [id number]     Arrival date & time 06/11/13  2159 History     First MD Initiated Contact with Patient 06/12/13 0205     Chief Complaint  Patient presents with  . indigestion    (Consider location/radiation/quality/duration/timing/severity/associated sxs/prior Treatment) Patient is a 42 y.o. female presenting with abdominal pain. The history is provided by the patient.  Abdominal Pain Pain location:  Epigastric Pain quality: burning and fullness   Pain radiation: up into the chest. Pain severity:  Moderate Onset quality:  Gradual Duration:  4 days Timing:  Constant Progression:  Unchanged Chronicity:  New Context: not awakening from sleep, not diet changes, not laxative use, not medication withdrawal and not retching   Relieved by:  Nothing Worsened by:  Nothing tried Ineffective treatments:  None tried Associated symptoms: chest pain and diarrhea   Associated symptoms: no cough, no fever, no shortness of breath, no vaginal bleeding and no vaginal discharge     Past Medical History  Diagnosis Date  . Arthritis   . Depression   . Headache(784.0)   . HSV-2 infection   . Thrombocytopenia 10/01/2012  . Immune deficiency disorder   . Stroke    Past Surgical History  Procedure Laterality Date  . Partial hysterectomy  2012    due to fibroid  . Wisdom tooth extraction  1995  . Cesarean section  2008    x1   Family History  Problem Relation Age of Onset  . Diabetes Father   . Cancer Paternal Aunt     breast  . Cancer Paternal Grandmother     colon CA   History  Substance Use Topics  . Smoking status: Never Smoker   . Smokeless tobacco: Never Used     Comment: Pt states she smokes.....but "not cigarettes"  . Alcohol Use: Yes     Comment: rarely   OB History   Grav Para Term Preterm Abortions TAB SAB Ect Mult Living   3         3     Obstetric Comments   Pt states that she has had a lot of miscarriages. Do not know how many     Review of Systems   Constitutional: Negative for fever.  HENT: Negative for neck pain and neck stiffness.   Respiratory: Negative for cough and shortness of breath.   Cardiovascular: Positive for chest pain. Negative for palpitations and leg swelling.  Gastrointestinal: Positive for abdominal pain and diarrhea.  Genitourinary: Negative for vaginal bleeding and vaginal discharge.  All other systems reviewed and are negative.    Allergies  Amoxicillin  Home Medications   Current Outpatient Rx  Name  Route  Sig  Dispense  Refill  . PARoxetine (PAXIL) 40 MG tablet   Oral   Take 40 mg by mouth every evening.           BP 156/84  Pulse 68  Temp(Src) 98.7 F (37.1 C) (Oral)  Resp 20  SpO2 100% Physical Exam  Constitutional: She is oriented to person, place, and time. She appears well-developed and well-nourished.  HENT:  Head: Normocephalic and atraumatic.  Mouth/Throat: Oropharynx is clear and moist.  Eyes: Conjunctivae are normal. Pupils are equal, round, and reactive to light.  Neck: Normal range of motion. Neck supple. No tracheal deviation present. No thyromegaly present.  Cardiovascular: Normal rate, regular rhythm and intact distal pulses.   Pulmonary/Chest: Effort normal and breath sounds normal. She has no wheezes. She has no rales.  Abdominal: Soft.  There is no tenderness. There is no rebound and no guarding.  Musculoskeletal: Normal range of motion.  Neurological: She is alert and oriented to person, place, and time.  Skin: Skin is warm and dry.  Psychiatric: She has a normal mood and affect.    ED Course   Procedures (including critical care time)  Labs Reviewed  CBC WITH DIFFERENTIAL - Abnormal; Notable for the following:    Platelets 147 (*)    All other components within normal limits  COMPREHENSIVE METABOLIC PANEL - Abnormal; Notable for the following:    GFR calc non Af Amer 69 (*)    GFR calc Af Amer 80 (*)    All other components within normal limits  POCT I-STAT  TROPONIN I   No results found. No diagnosis found.  MDM   Date: 06/12/2013  Rate: 70  Rhythm: normal sinus rhythm  QRS Axis: normal  Intervals: normal  ST/T Wave abnormalities: normal  Conduction Disutrbances: none  Narrative Interpretation: unremarkable  In the setting of 3 days of symptoms one negative EKG and troponin excludes ACS    Tarahji Ramthun K Knowledge Escandon-Rasch, MD 06/12/13 [phone]

## 2013-06-13 ENCOUNTER — Emergency Department (HOSPITAL_COMMUNITY)
Admission: EM | Admit: 2013-06-13 | Discharge: 2013-06-13 | Disposition: A | Discharge status: Home / Self Care | Payer: 59 | Attending: Emergency Medicine | Admitting: Emergency Medicine

## 2013-06-13 ENCOUNTER — Telehealth: Payer: Self-pay | Admitting: Family Medicine

## 2013-06-13 ENCOUNTER — Encounter (HOSPITAL_COMMUNITY): Payer: Self-pay

## 2013-06-13 DIAGNOSIS — IMO0002 Reserved for concepts with insufficient information to code with codable children: Secondary | ICD-10-CM | POA: Insufficient documentation

## 2013-06-13 DIAGNOSIS — F3289 Other specified depressive episodes: Secondary | ICD-10-CM | POA: Insufficient documentation

## 2013-06-13 DIAGNOSIS — Z3202 Encounter for pregnancy test, result negative: Secondary | ICD-10-CM | POA: Insufficient documentation

## 2013-06-13 DIAGNOSIS — Z8739 Personal history of other diseases of the musculoskeletal system and connective tissue: Secondary | ICD-10-CM | POA: Insufficient documentation

## 2013-06-13 DIAGNOSIS — F329 Major depressive disorder, single episode, unspecified: Secondary | ICD-10-CM | POA: Insufficient documentation

## 2013-06-13 DIAGNOSIS — Z79899 Other long term (current) drug therapy: Secondary | ICD-10-CM | POA: Insufficient documentation

## 2013-06-13 DIAGNOSIS — K920 Hematemesis: Secondary | ICD-10-CM | POA: Insufficient documentation

## 2013-06-13 DIAGNOSIS — Z8639 Personal history of other endocrine, nutritional and metabolic disease: Secondary | ICD-10-CM | POA: Insufficient documentation

## 2013-06-13 DIAGNOSIS — R1013 Epigastric pain: Secondary | ICD-10-CM | POA: Insufficient documentation

## 2013-06-13 DIAGNOSIS — K219 Gastro-esophageal reflux disease without esophagitis: Secondary | ICD-10-CM

## 2013-06-13 DIAGNOSIS — Z8673 Personal history of transient ischemic attack (TIA), and cerebral infarction without residual deficits: Secondary | ICD-10-CM | POA: Insufficient documentation

## 2013-06-13 DIAGNOSIS — Z862 Personal history of diseases of the blood and blood-forming organs and certain disorders involving the immune mechanism: Secondary | ICD-10-CM | POA: Insufficient documentation

## 2013-06-13 DIAGNOSIS — Z8619 Personal history of other infectious and parasitic diseases: Secondary | ICD-10-CM | POA: Insufficient documentation

## 2013-06-13 DIAGNOSIS — Z90711 Acquired absence of uterus with remaining cervical stump: Secondary | ICD-10-CM | POA: Insufficient documentation

## 2013-06-13 LAB — CBC WITH DIFFERENTIAL/PLATELET
Basophils Absolute: 0 10*3/uL (ref 0.0–0.1)
Basophils Relative: 0 % (ref 0–1)
Eosinophils Absolute: 0.1 10*3/uL (ref 0.0–0.7)
Eosinophils Relative: 1 % (ref 0–5)
HCT: 40.1 % (ref 36.0–46.0)
Hemoglobin: 13.7 g/dL (ref 12.0–15.0)
Lymphocytes Relative: 34 % (ref 12–46)
Lymphs Abs: 1.8 10*3/uL (ref 0.7–4.0)
MCH: 31.9 pg (ref 26.0–34.0)
MCHC: 34.2 g/dL (ref 30.0–36.0)
MCV: 93.5 fL (ref 78.0–100.0)
Monocytes Absolute: 0.4 10*3/uL (ref 0.1–1.0)
Monocytes Relative: 7 % (ref 3–12)
Neutro Abs: 3 10*3/uL (ref 1.7–7.7)
Neutrophils Relative %: 57 % (ref 43–77)
Platelets: 164 10*3/uL (ref 150–400)
RBC: 4.29 MIL/uL (ref 3.87–5.11)
RDW: 13.1 % (ref 11.5–15.5)
WBC: 5.2 10*3/uL (ref 4.0–10.5)

## 2013-06-13 LAB — COMPREHENSIVE METABOLIC PANEL
ALT: 19 U/L (ref 0–35)
AST: 16 U/L (ref 0–37)
Albumin: 3.8 g/dL (ref 3.5–5.2)
Alkaline Phosphatase: 40 U/L (ref 39–117)
BUN: 11 mg/dL (ref 6–23)
CO2: 26 mEq/L (ref 19–32)
Calcium: 9.3 mg/dL (ref 8.4–10.5)
Chloride: 104 mEq/L (ref 96–112)
Creatinine, Ser: 1 mg/dL (ref 0.50–1.10)
GFR calc Af Amer: 79 mL/min — ABNORMAL LOW (ref >90)
GFR calc non Af Amer: 68 mL/min — ABNORMAL LOW (ref >90)
Glucose, Bld: 103 mg/dL — ABNORMAL HIGH (ref 70–99)
Potassium: 3.8 mEq/L (ref 3.5–5.1)
Sodium: 137 mEq/L (ref 135–145)
Total Bilirubin: 0.8 mg/dL (ref 0.3–1.2)
Total Protein: 7.1 g/dL (ref 6.0–8.3)

## 2013-06-13 LAB — URINALYSIS, ROUTINE W REFLEX MICROSCOPIC
Bilirubin Urine: NEGATIVE
Glucose, UA: NEGATIVE mg/dL
Ketones, ur: 15 mg/dL — AB
Leukocytes, UA: NEGATIVE
Nitrite: NEGATIVE
Protein, ur: NEGATIVE mg/dL
Specific Gravity, Urine: 1.028 (ref 1.005–1.030)
Urobilinogen, UA: 1 mg/dL (ref 0.0–1.0)
pH: 7 (ref 5.0–8.0)

## 2013-06-13 LAB — URINE MICROSCOPIC-ADD ON

## 2013-06-13 LAB — LIPASE, BLOOD: Lipase: 23 U/L (ref 11–59)

## 2013-06-13 LAB — PREGNANCY, URINE: Preg Test, Ur: NEGATIVE

## 2013-06-13 MED ORDER — GI COCKTAIL ~~LOC~~
30.0000 mL | Freq: Once | ORAL | Status: AC
  Administered 2013-06-13: 30 mL via ORAL
  Filled 2013-06-13: qty 30

## 2013-06-13 MED ORDER — PANTOPRAZOLE SODIUM 40 MG PO TBEC
40.0000 mg | DELAYED_RELEASE_TABLET | Freq: Once | ORAL | Status: AC
  Administered 2013-06-13: 40 mg via ORAL
  Filled 2013-06-13: qty 1

## 2013-06-13 MED ORDER — ESOMEPRAZOLE MAGNESIUM 40 MG PO CPDR: 40.0000 mg | DELAYED_RELEASE_CAPSULE | Freq: Every day | ORAL | Status: DC

## 2013-06-13 NOTE — ED Notes (Signed)
Pt states was admitted to [facility] 8/12 with diagnosis of heartburn. Was prescribed Prilosec and Carafate. Pt stated took 2 teaspoons of Carafate last night 8/13 at 9:00pm. At 11:00pm pt stated started vomiting and lasted for 7 minutes. Pt stated there was red blood in her vomit and had not eaten since morning of 8/13. Has not vomited since. Pt states currently experiencing abd pain, no nausea.

## 2013-06-13 NOTE — Telephone Encounter (Signed)
Please call to check on pt tomorrow. 

## 2013-06-13 NOTE — Telephone Encounter (Signed)
Patient Information:  Caller Name: Marvis  Phone: [phone]  Patient: Dana Greer, Dana Greer  Gender: Female  DOB: 1971-02-09  Age: 42 Years  PCP: Ruthe Mannan [facility])  Pregnant: No  Office Follow Up:  Does the office need to follow up with this patient?: No  Instructions For The Office: N/A  RN Note:  Implanon. Vomited once yesterday evening with small amount of blood in emesis. Ate very little food yesterday.  Has not eaten any breakfast; last full meal was before ED visit 06/11/13.  Constant supraumbilical abdominal pain since 06/12/13 PM after 1st dose of Carafate. Due to 06/12/13 emesis with blood and constant abdominal pain now, advised to return to ED now.   Symptoms  Reason For Call & Symptoms: Vomited clear fluids with small amount of blood at end of emesis at 2230 06/12/13 approximately 30-60 minutes after taking 1st dose of Carafate. Carafat, 2 tsp QID was ordered in ED 06/11/13 after diagnosed with chronic GERD. Asking if should continue Carafate.  ED follow up instructions specified call PCP to see if needs to see GI.  Reviewed Health History In EMR: Yes  Reviewed Medications In EMR: Yes  Reviewed Allergies In EMR: Yes  Reviewed Surgeries / Procedures: Yes  Date of Onset of Symptoms: 06/12/2013  Treatments Tried: Prilosec, Carafate  Treatments Tried Worked: No OB / GYN:  LMP: Unknown  Guideline(s) Used:  Vomiting  Disposition Per Guideline:   Go to ED Now (or to Office with PCP Approval)  Reason For Disposition Reached:   Constant abdominal pain lasting > 2 hours  Advice Given:  N/A  RN Overrode Recommendation:  Go To ED  Agreed to return to ED due to constant abdominal pain now.

## 2013-06-13 NOTE — ED Provider Notes (Signed)
CSN: [id number]     Arrival date & time 06/13/13  1041 History     First MD Initiated Contact with Patient 06/13/13 1117     Chief Complaint  Patient presents with  . Hematemesis  . Abdominal Pain   (Consider location/radiation/quality/duration/timing/severity/associated sxs/prior Treatment) HPI Patient presents to the emergency department with epigastric pain.  Patient, states the pain started early yesterday.  She was seen in the emergency department yesterday afternoon.  She states that she was given Carafate, which caused her to vomit.  Patient, states, that she did not take any other medications.  Patient denies chest pain, shortness of breath, back pain, fever, cough, dizziness, weakness, dysuria, or syncope.  Patient, states, that she feels that she has indigestion, which she has had in the past. Past Medical History  Diagnosis Date  . Arthritis   . Depression   . Headache(784.0)   . HSV-2 infection   . Thrombocytopenia 10/01/2012  . Immune deficiency disorder   . Stroke    Past Surgical History  Procedure Laterality Date  . Partial hysterectomy  2012    due to fibroid  . Wisdom tooth extraction  1995  . Cesarean section  2008    x1  . Abdominal hysterectomy     Family History  Problem Relation Age of Onset  . Diabetes Father   . Cancer Paternal Aunt     breast  . Cancer Paternal Grandmother     colon CA   History  Substance Use Topics  . Smoking status: Never Smoker   . Smokeless tobacco: Never Used     Comment: Pt states she smokes.....but "not cigarettes"  . Alcohol Use: 1.2 oz/week    2 Glasses of wine per week     Comment: rarely   OB History   Grav Para Term Preterm Abortions TAB SAB Ect Mult Living   3         3     Obstetric Comments   Pt states that she has had a lot of miscarriages. Do not know how many     Review of Systems All other systems negative except as documented in the HPI. All pertinent positives and negatives as reviewed in the  HPI. Allergies  Amoxicillin and Carafate  Home Medications   Current Outpatient Rx  Name  Route  Sig  Dispense  Refill  . Camphor-Menthol-Methyl Sal (TIGER BALM MUSCLE RUB EX)   Apply externally   Apply 1 application topically 2 (two) times daily.         Marland Kitchen etonogestrel (IMPLANON) 68 MG IMPL implant   Subcutaneous   Inject 1 each into the skin continuous.         Marland Kitchen HYDROcodone-acetaminophen (NORCO) 10-325 MG per tablet   Oral   Take 1 tablet by mouth every 6 (six) hours as needed for pain.         . hydrocortisone cream 1 %   Topical   Apply 1 application topically daily.         Marland Kitchen omeprazole (PRILOSEC) 20 MG capsule   Oral   Take 1 capsule (20 mg total) by mouth daily.   30 capsule   0   . PARoxetine (PAXIL) 40 MG tablet   Oral   Take 40 mg by mouth every evening.          Marland Kitchen tetrahydrozoline 0.05 % ophthalmic solution   Both Eyes   Place 1 drop into both eyes daily as needed (for dry eyes).         Marland Kitchen  sucralfate (CARAFATE) 1 GM/10ML suspension   Oral   Take 10 mL (1 g total) by mouth 4 (four) times daily.   420 mL   0    BP 151/90  Pulse 63  Temp(Src) 98.5 F (36.9 C) (Oral)  Ht 5\' 2"  (1.575 m)  Wt 203 lb (92.08 kg)  BMI 37.12 kg/m2  SpO2 100% Physical Exam  Nursing note and vitals reviewed. Constitutional: She is oriented to person, place, and time. She appears well-developed and well-nourished. No distress.  HENT:  Head: Normocephalic and atraumatic.  Mouth/Throat: Oropharynx is clear and moist.  Eyes: Pupils are equal, round, and reactive to light.  Neck: Normal range of motion. Neck supple.  Cardiovascular: Normal rate, regular rhythm and normal heart sounds.   Pulmonary/Chest: Effort normal and breath sounds normal.  Abdominal: Soft. Normal appearance and bowel sounds are normal. She exhibits no distension. There is tenderness in the epigastric area. There is no rebound and no guarding. No hernia.    Neurological: She is alert and  oriented to person, place, and time.  Skin: Skin is warm and dry.    ED Course   Procedures (including critical care time)  Labs Reviewed  COMPREHENSIVE METABOLIC PANEL - Abnormal; Notable for the following:    Glucose, Bld 103 (*)    GFR calc non Af Amer 68 (*)    GFR calc Af Amer 79 (*)    All other components within normal limits  CBC WITH DIFFERENTIAL  PREGNANCY, URINE  LIPASE, BLOOD  URINALYSIS, ROUTINE W REFLEX MICROSCOPIC   Dg Abd Acute W/chest  06/12/2013   *RADIOLOGY REPORT*  Clinical Data: Diarrhea, nausea  ACUTE ABDOMEN SERIES (ABDOMEN 2 VIEW & CHEST 1 VIEW)  Comparison: None.  Findings: This report was generated during an Epic downtime while prior exams were not available. Once priors are available, the exam will be reviewed. If there is significant new information on the basis of prior exams, an addendum will be issued.  Marked S-shaped thoracolumbar spine scoliosis noted.  Heart size normal.  The lungs are clear.  No pleural effusion.  No free air beneath the diaphragms.  Normal bowel gas pattern.  No abnormal calcific opacity.  Pelvic phleboliths are noted.  No bowel dilatation.  IMPRESSION: No acute cardiopulmonary process.  Normal bowel gas pattern.   Original Report Authenticated By: Christiana Pellant, M.D.   Patient be referred to GI for further evaluation.  Seems most likely the patient has peptic ulcer disease versus GERD.  Seems less likely to be thoracic issue such as cardiac chest pain, or pulmonary embolus.  Patient feels better following, GI cocktail.  She is told to return here as needed.  I also advised her to increase her fluid intake.  MDM    Carlyle Dolly, PA-C 06/13/13 1346

## 2013-06-14 NOTE — Telephone Encounter (Signed)
Left vm for pt to return call  

## 2013-06-14 NOTE — ED Provider Notes (Signed)
Medical screening examination/treatment/procedure(s) were performed by non-physician practitioner and as supervising physician I was immediately available for consultation/collaboration.  Aaidyn San, MD 06/14/13 0807 

## 2013-06-18 NOTE — Telephone Encounter (Signed)
Left message on voice mail asking patient to call back. 

## 2013-06-20 NOTE — Telephone Encounter (Signed)
Spoke with patient.  She states she did go to ER and was diagnosed with allergic reaction to carafate.  States she feels better now.

## 2013-08-26 ENCOUNTER — Other Ambulatory Visit: Payer: Self-pay | Admitting: Oncology

## 2013-08-27 ENCOUNTER — Telehealth: Payer: Self-pay | Admitting: Hematology and Oncology

## 2013-08-27 NOTE — Telephone Encounter (Signed)
sw pt made aware of Dec appts shh

## 2013-09-24 ENCOUNTER — Encounter: Payer: Self-pay | Admitting: Family Medicine

## 2013-09-24 ENCOUNTER — Emergency Department (HOSPITAL_COMMUNITY): Payer: 59

## 2013-09-24 ENCOUNTER — Ambulatory Visit (INDEPENDENT_AMBULATORY_CARE_PROVIDER_SITE_OTHER): Payer: 59 | Admitting: Family Medicine

## 2013-09-24 ENCOUNTER — Encounter (HOSPITAL_COMMUNITY): Payer: Self-pay | Admitting: Emergency Medicine

## 2013-09-24 ENCOUNTER — Emergency Department (HOSPITAL_COMMUNITY)
Admission: EM | Admit: 2013-09-24 | Discharge: 2013-09-24 | Disposition: A | Discharge status: Home / Self Care | Payer: 59 | Attending: Emergency Medicine | Admitting: Emergency Medicine

## 2013-09-24 VITALS — BP 138/84 | HR 84 | Temp 99.4°F | Wt 208.0 lb

## 2013-09-24 DIAGNOSIS — M79641 Pain in right hand: Secondary | ICD-10-CM | POA: Insufficient documentation

## 2013-09-24 DIAGNOSIS — F3289 Other specified depressive episodes: Secondary | ICD-10-CM | POA: Insufficient documentation

## 2013-09-24 DIAGNOSIS — M19049 Primary osteoarthritis, unspecified hand: Secondary | ICD-10-CM | POA: Insufficient documentation

## 2013-09-24 DIAGNOSIS — M1990 Unspecified osteoarthritis, unspecified site: Secondary | ICD-10-CM

## 2013-09-24 DIAGNOSIS — Z79899 Other long term (current) drug therapy: Secondary | ICD-10-CM | POA: Insufficient documentation

## 2013-09-24 DIAGNOSIS — F329 Major depressive disorder, single episode, unspecified: Secondary | ICD-10-CM | POA: Insufficient documentation

## 2013-09-24 DIAGNOSIS — M79609 Pain in unspecified limb: Secondary | ICD-10-CM

## 2013-09-24 LAB — CBC WITH DIFFERENTIAL/PLATELET
Basophils Absolute: 0 10*3/uL (ref 0.0–0.1)
HCT: 39 % (ref 36.0–46.0)
Hemoglobin: 13.2 g/dL (ref 12.0–15.0)
Lymphs Abs: 2.1 10*3/uL (ref 0.7–4.0)
MCV: 95.3 fl (ref 78.0–100.0)
Monocytes Absolute: 0.4 10*3/uL (ref 0.1–1.0)
Monocytes Relative: 5.9 % (ref 3.0–12.0)
Neutro Abs: 3.7 10*3/uL (ref 1.4–7.7)
RDW: 14.8 % — ABNORMAL HIGH (ref 11.5–14.6)

## 2013-09-24 NOTE — ED Provider Notes (Signed)
CSN: [id number]     Arrival date & time 09/24/13  0805 History   First MD Initiated Contact with Patient 09/24/13 0830     Chief Complaint  Patient presents with  . Hand Pain    right   (Consider location/radiation/quality/duration/timing/severity/associated sxs/prior Treatment) HPI Comments: Presents to the ER for evaluation of hand pain. Patient first started having moderate pain in the right hand yesterday. She cannot remember injuring it in any way. Patient reports pain, swelling in the palm of the hand, at the area of the first knuckle. Pain worsens with movement. Patient reports that she has a lot of arthritis, has never had any problems with her hand.  Patient is a 42 y.o. female presenting with hand pain.  Hand Pain    Past Medical History  Diagnosis Date  . Arthritis   . Depression   . Headache(784.0)   . HSV-2 infection   . Thrombocytopenia 10/01/2012  . Immune deficiency disorder   . Stroke    Past Surgical History  Procedure Laterality Date  . Partial hysterectomy  2012    due to fibroid  . Wisdom tooth extraction  1995  . Cesarean section  2008    x1  . Abdominal hysterectomy     Family History  Problem Relation Age of Onset  . Diabetes Father   . Cancer Paternal Aunt     breast  . Cancer Paternal Grandmother     colon CA   History  Substance Use Topics  . Smoking status: Never Smoker   . Smokeless tobacco: Never Used     Comment: Pt states she smokes.....but "not cigarettes"  . Alcohol Use: 1.2 oz/week    2 Glasses of wine per week     Comment: rarely   OB History   Grav Para Term Preterm Abortions TAB SAB Ect Mult Living   3         3     Obstetric Comments   Pt states that she has had a lot of miscarriages. Do not know how many     Review of Systems  Musculoskeletal: Positive for arthralgias.  Skin: Negative.     Allergies  Amoxicillin and Carafate  Home Medications   Current Outpatient Rx  Name  Route  Sig  Dispense  Refill  .  ALPRAZolam (XANAX) 0.25 MG tablet   Oral   Take 0.25 mg by mouth daily as needed for anxiety.          Marland Kitchen esomeprazole (NEXIUM) 40 MG capsule   Oral   Take 1 capsule (40 mg total) by mouth daily.   21 capsule   0   . hydrocortisone cream 1 %   Topical   Apply 1 application topically daily.         Marland Kitchen ibuprofen (ADVIL,MOTRIN) 200 MG tablet   Oral   Take 400 mg by mouth every 6 (six) hours as needed.         Marland Kitchen PARoxetine (PAXIL) 40 MG tablet   Oral   Take 40 mg by mouth every evening.          Marland Kitchen tetrahydrozoline 0.05 % ophthalmic solution   Both Eyes   Place 1 drop into both eyes daily as needed (for dry eyes).         Marland Kitchen etonogestrel (IMPLANON) 68 MG IMPL implant   Subcutaneous   Inject 1 each into the skin continuous.          BP 173/95  Pulse  70  Temp(Src) 98.3 F (36.8 C) (Oral)  Resp 20  SpO2 100% Physical Exam  Cardiovascular:  Pulses:      Radial pulses are 2+ on the right side.  Musculoskeletal:       Hands: Skin:       ED Course  Procedures (including critical care time) Labs Review Labs Reviewed - No data to display Imaging Review No results found.  EKG Interpretation   None       MDM  Diagnosis: Arthritis  She presents to ER with complaints of joint pain. She has a history of multiple areas of arthritis. She has been expressing 2 days of pain in her hand with some swelling. No sign of infection. Symptoms most consistent with arthritis. X-ray performed, no acute pathology. Patient left before receiving the results of the x-ray. No treatment necessary.    Gilda Crease, MD 09/24/13 [phone]

## 2013-09-24 NOTE — Patient Instructions (Signed)
I'd suggest blood work today and start meloxicam (mobic 7.5mg  daily for 5 days with food then as needed). We will call you with blood work results  Good to see you today, call us with questions.

## 2013-09-24 NOTE — Progress Notes (Signed)
  Subjective:    Patient ID: Dana Greer, female    DOB: 1970-11-09, 42 y.o.   MRN: [id number]  HPI CC: hand pain/swelling  Pt presents today as ER f/u for R hand pain and swelling that started yesterday.  Denies inciting trauma/injury to hand.  Eval at [facility] ER this morning showing normal xray: RIGHT HAND - COMPLETE 3+ VIEW  COMPARISON: None.  FINDINGS:  Three views of the right hand submitted. No acute fracture or subluxation. No periosteal reaction or bony erosion.  IMPRESSION:  Negative.   Pt endorses 1d h/o right 2nd-4th MT joint swelling.  No warmth of hand or erythema.  Not tender to superficial touch but pain with pressing areas. Taking care of children over last several days, very active.  May have bumped hand but doesn't remember traumatic event. H/o osteoarthritis - has had lower back pain shooting down into legs, has had bilateral knee pain.  H/o knee swelling in past.  + hip arthralgias. + foot pains as well. No elbow or shoulder pain usually.  No prior wrist or hand involvement. Has tried advil for discomfort.  Has tried heating pad. No new rashes, fevers/chills, nausea or other systemic symptoms.  Father with h/o gout but pt without h/o of this. Currently with financial concerns so she has not been able to f/u with PCP as regularly as she should.  Has been prescribed mobic which worked remarkably well in the past for her arthritic pains but given h/o remote R thalamic infarct found on MRI, was told to avoid NSAIDs.  Tries to avoid narcotics as well.  Past Medical History  Diagnosis Date  . Arthritis   . Depression   . Headache(784.0)   . HSV-2 infection   . Thrombocytopenia 10/01/2012  . Immune deficiency disorder   . Stroke    Past Surgical History  Procedure Laterality Date  . Partial hysterectomy  2012    due to fibroid  . Wisdom tooth extraction  1995  . Cesarean section  2008    x1  . Abdominal hysterectomy       Review of Systems Per HPI     Objective:   Physical Exam  Nursing note and vitals reviewed. Constitutional: She appears well-developed and well-nourished. No distress.  HENT:  Mouth/Throat: Oropharynx is clear and moist. No oropharyngeal exudate.  No oral lesions  Musculoskeletal: She exhibits edema.  FROM at elbows. R wrist tender at full flexion L hand WNL R hand - swelling noted at 3rd and 2nd digits, very tender to palpation at 3rd MCP and PIP joints but no erythema or warmth. Swelling of 2nd and 3rd digits noted as well. Decreased grip strength 2/2 pain.  Skin: Skin is warm and dry. No rash noted. No erythema.       Assessment & Plan:

## 2013-09-24 NOTE — ED Notes (Signed)
Pt wanting to leave AMA, states that she is tired of waiting. MD notified.

## 2013-09-24 NOTE — Progress Notes (Signed)
Pre-visit discussion using our clinic review tool. No additional management support is needed unless otherwise documented below in the visit note.  

## 2013-09-24 NOTE — ED Notes (Signed)
p states that she noticed yesterday she was having right hand pain and that her right hand swollen compared to her left and unable to grasp anything with right hand or fully extend her right hand. Pt doesn't remember injurying it. Thinks might could be arthritis due to having arthritis in her back.

## 2013-09-24 NOTE — Assessment & Plan Note (Addendum)
1d h/o R index and (mainly) middle finger MCP, PIP swelling with pain limiting ROM and index/middle finger swelling evident on exam today.  No erythema or warmth pointing against infection. Check blood work today including ESR/CRP, CBC, uric acid, RF, ANA and TSH. Discussed pros/cons of NSAIDs esp with possible hx remote CVA but pt feels that this medicine helped the most out of analgesics she's tried. We opted to start meloxicam 7.5mg  daily for 5 days then PRN. Will await blood work, low threshold to refer to rheum for further evaluation.  Pt agrees with plan. Lab Results  Component Value Date   CREATININE 1.00 06/13/2013

## 2013-09-25 LAB — TSH: TSH: 0.75 u[IU]/mL (ref 0.35–5.50)

## 2013-09-30 ENCOUNTER — Other Ambulatory Visit: Payer: Self-pay | Admitting: Family Medicine

## 2013-09-30 DIAGNOSIS — M79641 Pain in right hand: Secondary | ICD-10-CM

## 2013-09-30 DIAGNOSIS — M129 Arthropathy, unspecified: Secondary | ICD-10-CM

## 2013-09-30 DIAGNOSIS — M1990 Unspecified osteoarthritis, unspecified site: Secondary | ICD-10-CM

## 2013-10-08 ENCOUNTER — Telehealth: Payer: Self-pay | Admitting: Hematology and Oncology

## 2013-10-08 NOTE — Telephone Encounter (Signed)
Per NG called pt to cx 12/10 lb/fu. Per NG issue resolved appt not needed. Pt aware.

## 2013-10-09 ENCOUNTER — Ambulatory Visit: Payer: 59 | Admitting: Hematology and Oncology

## 2013-10-09 ENCOUNTER — Other Ambulatory Visit: Payer: 59 | Admitting: Lab

## 2013-10-09 ENCOUNTER — Other Ambulatory Visit: Payer: 59

## 2013-10-09 ENCOUNTER — Ambulatory Visit: Payer: 59 | Admitting: Oncology

## 2014-02-02 ENCOUNTER — Emergency Department: Payer: Self-pay | Admitting: Emergency Medicine

## 2014-02-03 ENCOUNTER — Encounter: Payer: Self-pay | Admitting: Family Medicine

## 2014-02-03 ENCOUNTER — Ambulatory Visit (INDEPENDENT_AMBULATORY_CARE_PROVIDER_SITE_OTHER): Payer: 59 | Admitting: Family Medicine

## 2014-02-03 VITALS — BP 138/82 | HR 62 | Temp 98.6°F | Ht 62.0 in | Wt 210.5 lb

## 2014-02-03 DIAGNOSIS — G43109 Migraine with aura, not intractable, without status migrainosus: Secondary | ICD-10-CM | POA: Insufficient documentation

## 2014-02-03 DIAGNOSIS — G43909 Migraine, unspecified, not intractable, without status migrainosus: Secondary | ICD-10-CM

## 2014-02-03 NOTE — Assessment & Plan Note (Signed)
>  15 minutes spent in face to face time with patient, >50% spent in counselling or coordination of care Forms filled out and sent to be faxed back to lawyer stating that I do not manage these medical problems and they need to request records from Dr. Melton Alar.  Explained this to Ms. Goins as well.

## 2014-02-03 NOTE — Progress Notes (Signed)
Pre visit review using our clinic review tool, if applicable. No additional management support is needed unless otherwise documented below in the visit note. 

## 2014-02-03 NOTE — Progress Notes (Signed)
Subjective:   Patient ID: Dana Greer, female    DOB: [birth year], 43 y.o.   MRN: [id number]  Dana Greer is a pleasant 43 y.o. year old female who presents to clinic today with form completion  on 02/03/2014  HPI: Darras Law sent me a form to fill out asking questions about how her vertigo and migraines interfere with her work and to send medical records supporting this- she is applying for disability.  She reports that she has been seeing Dr. Melton Alar (neurology) for her chronic migraines and vertigo and he has been on several different treatments.    Patient Active Problem List   Diagnosis Date Noted  . Right hand pain 09/24/2013  . Coccyx pain 03/27/2013  . Sacroiliac dysfunction 03/27/2013  . Thrombocytopenia 10/01/2012  . Intractable migraine 08/31/2012  . Osteoarthritis 05/11/2012  . Female bladder prolapse 05/11/2012  . Hypokalemia 05/11/2012  . Allergic conjunctivitis 08/11/2011  . ADJ DISORDER WITH MIXED ANXIETY & DEPRESSED MOOD 06/30/2008  . HYPERHIDROSIS 01/25/2008  . BACK PAIN, LUMBAR, CHRONIC 10/16/2007  . REACTION, ACUTE STRESS W/EMOTIONAL DSTURB 08/01/2007  . HYPERGLYCEMIA 08/01/2007  . ELEVATED BLOOD PRESSURE WITHOUT DIAGNOSIS OF HYPERTENSION 08/01/2007  . GOITER NOS 07/11/2007  . POLYCYSTIC OVARIAN DISEASE 05/24/2007  . HIRSUTISM 05/24/2007  . ARTHROPATHY NOS, MULTIPLE SITES 05/24/2007   Past Medical History  Diagnosis Date  . Arthritis   . Depression   . Headache(784.0)   . HSV-2 infection   . Thrombocytopenia 10/01/2012  . Immune deficiency disorder   . Stroke    Past Surgical History  Procedure Laterality Date  . Partial hysterectomy  2012    due to fibroid  . Wisdom tooth extraction  1995  . Cesarean section  2008    x1  . Abdominal hysterectomy     History  Substance Use Topics  . Smoking status: Never Smoker   . Smokeless tobacco: Never Used     Comment: Pt states she smokes.....but "not cigarettes"  . Alcohol Use: 1.2 oz/week    2  Glasses of wine per week     Comment: rarely   Family History  Problem Relation Age of Onset  . Diabetes Father   . Cancer Paternal Aunt     breast  . Cancer Paternal Grandmother     colon CA   Allergies  Allergen Reactions  . Amoxicillin Other (See Comments)    Yeast infection   . Carafate [Sucralfate] Nausea And Vomiting   Current Outpatient Prescriptions on File Prior to Visit  Medication Sig Dispense Refill  . ALPRAZolam (XANAX) 0.25 MG tablet Take 0.25 mg by mouth daily as needed for anxiety.       Marland Kitchen esomeprazole (NEXIUM) 40 MG capsule Take 1 capsule (40 mg total) by mouth daily.  21 capsule  0  . etonogestrel (IMPLANON) 68 MG IMPL implant Inject 1 each into the skin continuous.      . hydrocortisone cream 1 % Apply 1 application topically daily.      Marland Kitchen ibuprofen (ADVIL,MOTRIN) 200 MG tablet Take 400 mg by mouth every 6 (six) hours as needed.      Marland Kitchen PARoxetine (PAXIL) 40 MG tablet Take 40 mg by mouth every evening.       Marland Kitchen tetrahydrozoline 0.05 % ophthalmic solution Place 1 drop into both eyes daily as needed (for dry eyes).      . [DISCONTINUED] darifenacin (ENABLEX) 7.5 MG 24 hr tablet Take 7.5 mg by mouth daily.       No  current facility-administered medications on file prior to visit.   The PMH, PSH, Social History, Family History, Medications, and allergies have been reviewed in [facility], and have been updated if relevant.   Review of Systems See HPI    Objective:    BP 138/82  Pulse 62  Temp(Src) 98.6 F (37 C) (Oral)  Ht 5\' 2"  (1.575 m)  Wt 210 lb 8 oz (95.482 kg)  BMI 38.49 kg/m2  SpO2 98%   Physical Exam  Gen:  Alert, NAD Psych:  Tearful, good eye contact      Assessment & Plan:   No diagnosis found. No Follow-up on file.

## 2014-03-26 ENCOUNTER — Telehealth: Payer: Self-pay | Admitting: Family Medicine

## 2014-03-26 NOTE — Telephone Encounter (Signed)
Pt states she has gone on the [employer].  I recall that at a provider meeting everyone agreed to take one patient per year with the orange card.  She is established here at [facility] but she wants to know if she can switch back to Dr. Glori Bickers as her primary care provider.  Please advise.

## 2014-03-26 NOTE — Telephone Encounter (Signed)
That is fine with me.

## 2014-03-26 NOTE — Telephone Encounter (Signed)
Vaughan Basta, Please contact the patient and make her an appointment to see Dr. Glori Bickers.  Thank you!!

## 2014-04-07 ENCOUNTER — Ambulatory Visit (INDEPENDENT_AMBULATORY_CARE_PROVIDER_SITE_OTHER): Payer: 59 | Admitting: Family Medicine

## 2014-04-07 ENCOUNTER — Encounter: Payer: Self-pay | Admitting: Family Medicine

## 2014-04-07 ENCOUNTER — Encounter (INDEPENDENT_AMBULATORY_CARE_PROVIDER_SITE_OTHER): Payer: Self-pay

## 2014-04-07 VITALS — BP 128/94 | HR 63 | Temp 98.6°F | Ht 62.75 in | Wt 207.8 lb

## 2014-04-07 DIAGNOSIS — M1990 Unspecified osteoarthritis, unspecified site: Secondary | ICD-10-CM

## 2014-04-07 DIAGNOSIS — J069 Acute upper respiratory infection, unspecified: Secondary | ICD-10-CM | POA: Insufficient documentation

## 2014-04-07 DIAGNOSIS — B9789 Other viral agents as the cause of diseases classified elsewhere: Principal | ICD-10-CM

## 2014-04-07 DIAGNOSIS — H6990 Unspecified Eustachian tube disorder, unspecified ear: Secondary | ICD-10-CM

## 2014-04-07 DIAGNOSIS — H6980 Other specified disorders of Eustachian tube, unspecified ear: Secondary | ICD-10-CM | POA: Insufficient documentation

## 2014-04-07 MED ORDER — BENZONATATE 200 MG PO CAPS: 200.0000 mg | ORAL_CAPSULE | Freq: Three times a day (TID) | ORAL | Status: DC | PRN

## 2014-04-07 MED ORDER — MELOXICAM 15 MG PO TABS: 15.0000 mg | ORAL_TABLET | Freq: Every day | ORAL | Status: DC

## 2014-04-07 MED ORDER — FLUTICASONE PROPIONATE 50 MCG/ACT NA SUSP: 2.0000 | Freq: Every day | NASAL | Status: DC

## 2014-04-07 NOTE — Assessment & Plan Note (Signed)
Disc symptomatic care - see instructions on AVS flonase for this and ETD in L ear  Tessalon Update if not starting to improve in a week or if worsening

## 2014-04-07 NOTE — Progress Notes (Signed)
Subjective:    Patient ID: Dana Greer, female    DOB: August 01, 1971, 43 y.o.   MRN: [id number]  HPI Here with uri symptoms  Very hoarse  Started like a cold  ST and nasal drainage is clear / yellow from throat-post nasal  Ears are full/ no pain  Only had the fever on Friday/sat  No facial pain  Cough is pretty bad  Also wheezing    Went to hospital - ? End of April for sinus problem/ear problem- given prednisone (did not help) Felt pressure in ears  Now cannot hear well on L - [place]  Not really better  She has eczema in ears   Her arthritis in her hands is really bad  Also her low back and feet - she is miserable  She takes suldinac  occ takes ibuprofen or aleve - she does take too much  She has never seen a rheumatologist   Knows she has osteoarthritis  It runs in the family    Patient Active Problem List   Diagnosis Date Noted  . Viral URI with cough 04/07/2014  . ETD (eustachian tube dysfunction) 04/07/2014  . Migraines 02/03/2014  . Right hand pain 09/24/2013  . Coccyx pain 03/27/2013  . Sacroiliac dysfunction 03/27/2013  . Thrombocytopenia 10/01/2012  . Intractable migraine 08/31/2012  . Osteoarthritis 05/11/2012  . Female bladder prolapse 05/11/2012  . Hypokalemia 05/11/2012  . Allergic conjunctivitis 08/11/2011  . ADJ DISORDER WITH MIXED ANXIETY & DEPRESSED MOOD 06/30/2008  . HYPERHIDROSIS 01/25/2008  . BACK PAIN, LUMBAR, CHRONIC 10/16/2007  . REACTION, ACUTE STRESS W/EMOTIONAL DSTURB 08/01/2007  . HYPERGLYCEMIA 08/01/2007  . ELEVATED BLOOD PRESSURE WITHOUT DIAGNOSIS OF HYPERTENSION 08/01/2007  . GOITER NOS 07/11/2007  . POLYCYSTIC OVARIAN DISEASE 05/24/2007  . HIRSUTISM 05/24/2007  . ARTHROPATHY NOS, MULTIPLE SITES 05/24/2007   Past Medical History  Diagnosis Date  . Arthritis   . Depression   . Headache(784.0)   . HSV-2 infection   . Thrombocytopenia 10/01/2012  . Immune deficiency disorder   . Stroke    Past  Surgical History  Procedure Laterality Date  . Partial hysterectomy  2012    due to fibroid  . Wisdom tooth extraction  1995  . Cesarean section  2008    x1  . Abdominal hysterectomy     History  Substance Use Topics  . Smoking status: Never Smoker   . Smokeless tobacco: Never Used     Comment: Pt states she smokes.....but "not cigarettes"  . Alcohol Use: 1.2 oz/week    2 Glasses of wine per week     Comment: rarely   Family History  Problem Relation Age of Onset  . Diabetes Father   . Cancer Paternal Aunt     breast  . Cancer Paternal Grandmother     colon CA   Allergies  Allergen Reactions  . Amoxicillin Other (See Comments)    Yeast infection   . Carafate [Sucralfate] Nausea And Vomiting   Current Outpatient Prescriptions on File Prior to Visit  Medication Sig Dispense Refill  . ALPRAZolam (XANAX) 0.25 MG tablet Take 0.25 mg by mouth daily as needed for anxiety.       Marland Kitchen esomeprazole (NEXIUM) 40 MG capsule Take 1 capsule (40 mg total) by mouth daily.  21 capsule  0  . etonogestrel (IMPLANON) 68 MG IMPL implant Inject 1 each into the skin continuous.      . hydrocortisone cream 1 % Apply 1 application  topically daily.      Marland Kitchen PARoxetine (PAXIL) 40 MG tablet Take 40 mg by mouth every evening.       Marland Kitchen tetrahydrozoline 0.05 % ophthalmic solution Place 1 drop into both eyes daily as needed (for dry eyes).      . [DISCONTINUED] darifenacin (ENABLEX) 7.5 MG 24 hr tablet Take 7.5 mg by mouth daily.       No current facility-administered medications on file prior to visit.    Review of Systems Review of Systems  Constitutional: Negative for fever, appetite change, and unexpected weight change.  ENt pos for congestion/rhinorrhea/ear discomfort  Eyes: Negative for pain and visual disturbance.  Respiratory: Negative for shortness of breath.   Cardiovascular: Negative for cp or palpitations    Gastrointestinal: Negative for nausea, diarrhea and constipation.  Genitourinary:  Negative for urgency and frequency.  Skin: Negative for pallor or rash   MSK pos for diffuse joint pain with occ joint swelling  Neurological: Negative for weakness, light-headedness, numbness and headaches.  Hematological: Negative for adenopathy. Does not bruise/bleed easily.  Psychiatric/Behavioral: Negative for dysphoric mood. The patient is not nervous/anxious.         Objective:   Physical Exam  Constitutional: She appears well-developed and well-nourished. No distress.  obese and well appearing   HENT:  Head: Normocephalic and atraumatic.  Right Ear: External ear normal.  Mouth/Throat: Oropharynx is clear and moist.  Nares are injected and congested  L TM is retracted and dull  No sinus tenderness   Eyes: Conjunctivae and EOM are normal. Pupils are equal, round, and reactive to light. Right eye exhibits no discharge. Left eye exhibits no discharge. No scleral icterus.  Neck: Normal range of motion. Neck supple. No JVD present. No thyromegaly present.  Cardiovascular: Normal rate, regular rhythm, normal heart sounds and intact distal pulses.  Exam reveals no gallop.   Pulmonary/Chest: Effort normal and breath sounds normal. No respiratory distress. She has no wheezes. She has no rales.  Harsh bs  Few scattered rhonchi  Musculoskeletal: She exhibits tenderness. She exhibits no edema.  MCP joints are diffusely tender  Crepitus in knees bilat  Nl gait   Limited rom of LS   Lymphadenopathy:    She has no cervical adenopathy.  Neurological: She is alert. She has normal reflexes. No cranial nerve deficit. She exhibits normal muscle tone. Coordination normal.  Skin: Skin is warm and dry. No rash noted. No erythema. No pallor.  Psychiatric: She has a normal mood and affect.          Assessment & Plan:   Problem List Items Addressed This Visit     Respiratory   Viral URI with cough - Primary     Disc symptomatic care - see instructions on AVS flonase for this and ETD in  L ear  Tessalon Update if not starting to improve in a week or if worsening         Nervous and Auditory   ETD (eustachian tube dysfunction)     Trial of flonase ns  Will continue through summer Worse in L ear- retracted on exam If no imp will ref to ENT       Musculoskeletal and Integument   Osteoarthritis     With neg rheum lab screening  Multiple joints  Stop all nsaids (she is taking several at the same time) Go back to daily meloxicam with food  Will plan f/u to look at bp which has been borderline and disc doing  labs as well  Ref to rheumatology for further eval of joint pain     Relevant Medications      meloxicam (MOBIC) tablet   Other Relevant Orders      Ambulatory referral to Rheumatology

## 2014-04-07 NOTE — Patient Instructions (Signed)
I think you have a viral upper respiratory infection with cough and laryngitis - drink lots of fluids Try tessalon for cough  Try the flonase nasal spray - for ear problem as well as congestion - take it for the rest of the summer  Stop sulindac/ibuprofen/aleve or any other otc pain med and get back on meloxicam  Stop at check out for referral to rheumatology

## 2014-04-07 NOTE — Assessment & Plan Note (Signed)
Trial of flonase ns  Will continue through summer Worse in L ear- retracted on exam If no imp will ref to ENT

## 2014-04-07 NOTE — Telephone Encounter (Signed)
Pt appt has been made and [employer] has been set up.

## 2014-04-07 NOTE — Progress Notes (Signed)
Pre visit review using our clinic review tool, if applicable. No additional management support is needed unless otherwise documented below in the visit note. 

## 2014-04-07 NOTE — Assessment & Plan Note (Signed)
With neg rheum lab screening  Multiple joints  Stop all nsaids (she is taking several at the same time) Go back to daily meloxicam with food  Will plan f/u to look at bp which has been borderline and disc doing labs as well  Ref to rheumatology for further eval of joint pain

## 2014-04-23 ENCOUNTER — Telehealth: Payer: Self-pay

## 2014-04-23 NOTE — Telephone Encounter (Signed)
I need some more details from patient. See what you can get.  Thanks.

## 2014-04-23 NOTE — Telephone Encounter (Signed)
Patient notified as instructed by telephone. 

## 2014-04-23 NOTE — Telephone Encounter (Signed)
Pt left v/m; pt was seen 04/07/14 and pt is presently taking Meloxicam. Pt is having lower back pain;due to pt taking Meloxicam pt cannot take OTC pain relievers. Pt wants to know what she can take for back pain. [place]. Unable to reach pt for more detailed symptoms about back pain.

## 2014-04-23 NOTE — Telephone Encounter (Signed)
Patient stated that she is taking Meloxicam for the arthritis pain that she is having. Patient stated that the Meloxicam usually helps, but is not helping today and she may be just having a bad day. Patient was told by Dr. Glori Bickers not to take any nsaids while taking this medication.  Patient stated that she has not done anything to make her hurt this bad today. Patient feels that it is just her arthritis and she does also have a headache. Patient was calling to find out if there is anything OTC that she can take with the Meloxicam to help the headache and pain that she is having today.

## 2014-04-23 NOTE — Telephone Encounter (Signed)
Can take 1-2 extra strength tylenol up to 3 times a day (with the meloxicam).  That may help.  Thanks.

## 2014-05-07 ENCOUNTER — Telehealth: Payer: Self-pay | Admitting: Family Medicine

## 2014-05-07 NOTE — Telephone Encounter (Signed)
Pt called and says the Meloxicam (sp?) is not helping her headaches and wants to know if there is something else she can try. She wanted advise from you to see if you will call in something else or if you need to see her first. Please call patient to advise. Thank you.

## 2014-05-07 NOTE — Telephone Encounter (Signed)
She has seen Dr Melton Alar in the past for headaches - I rev his note from 1/14 (please send for most recent note if there is one) It looked like he tried her with topamax and gave her parafon forte for muscle neck spasms, lidocaine nasal spray and clinoril (another nsaid) Did any of these help? Did she end up going back?

## 2014-05-08 MED ORDER — CYCLOBENZAPRINE HCL 10 MG PO TABS: 10.0000 mg | ORAL_TABLET | Freq: Three times a day (TID) | ORAL | Status: DC | PRN

## 2014-05-08 NOTE — Telephone Encounter (Signed)
Pt said the appt with Dr. Melton Alar on 1/14 was her last appt with him. Pt said the medications he tried didn't help and the topamax made her feel bad and made her feel like her mental status had changed and she was sleep walking. Pt not sure if taking all of those medications together that Dr. Melton Alar prescribe made her have a bad reaction, but she didn't want to try the medications again. Pt said her HA has been so bad that she did take a [employer] powder and it gave her a little relief, but doesn't know what else to try, please adivse

## 2014-05-08 NOTE — Telephone Encounter (Signed)
Let's try another muscle relaxer Please call in flexeril If no improvement -we may need to send her back to Dr Melton Alar or another neurologist/headache specialist

## 2014-05-09 NOTE — Telephone Encounter (Signed)
Left voicemail requesting pt to call office 

## 2014-05-13 MED ORDER — CYCLOBENZAPRINE HCL 10 MG PO TABS: 10.0000 mg | ORAL_TABLET | Freq: Three times a day (TID) | ORAL | Status: DC | PRN

## 2014-05-13 NOTE — Addendum Note (Signed)
Addended by: Tammi Sou on: 05/13/2014 04:06 PM   Modules accepted: Orders

## 2014-05-13 NOTE — Telephone Encounter (Signed)
Pt notified of Dr. Marliss Coots comments/recommendation. Pt will update [place] if flexeril doesn't help and if she needs to see a neurologist/HA specialist she does not want to go back to Dr. Melton Alar  Rx sent to pharmacy

## 2014-05-19 ENCOUNTER — Telehealth: Payer: Self-pay | Admitting: Family Medicine

## 2014-05-19 NOTE — Telephone Encounter (Signed)
Pt states she wants to know what you think is wrong with her.  She said the rheumatologist said they don't think anything is wrong with her.  She wants to know what you think is going on because you have given her medicine and it is not working and now she is being told nothing is wrong.   She wants to know what the next step is now.  She knows she cannot get up and down the steps and her rheumatologist just told her she is fine.

## 2014-05-19 NOTE — Telephone Encounter (Signed)
Please send for notes from rheumatology-I do not think I have seen them yet- then I can comment  I also got a disability form for her today regarding headaches - how are her headaches ?

## 2014-05-20 ENCOUNTER — Encounter (HOSPITAL_COMMUNITY): Payer: Self-pay | Admitting: Emergency Medicine

## 2014-05-20 ENCOUNTER — Emergency Department (HOSPITAL_COMMUNITY)
Admission: EM | Admit: 2014-05-20 | Discharge: 2014-05-21 | Disposition: A | Discharge status: Home / Self Care | Payer: No Typology Code available for payment source | Attending: Emergency Medicine | Admitting: Emergency Medicine

## 2014-05-20 DIAGNOSIS — Z791 Long term (current) use of non-steroidal anti-inflammatories (NSAID): Secondary | ICD-10-CM | POA: Insufficient documentation

## 2014-05-20 DIAGNOSIS — F329 Major depressive disorder, single episode, unspecified: Secondary | ICD-10-CM | POA: Insufficient documentation

## 2014-05-20 DIAGNOSIS — F3289 Other specified depressive episodes: Secondary | ICD-10-CM | POA: Insufficient documentation

## 2014-05-20 DIAGNOSIS — M25519 Pain in unspecified shoulder: Secondary | ICD-10-CM | POA: Insufficient documentation

## 2014-05-20 DIAGNOSIS — Z8739 Personal history of other diseases of the musculoskeletal system and connective tissue: Secondary | ICD-10-CM | POA: Insufficient documentation

## 2014-05-20 DIAGNOSIS — M5432 Sciatica, left side: Secondary | ICD-10-CM

## 2014-05-20 DIAGNOSIS — Z862 Personal history of diseases of the blood and blood-forming organs and certain disorders involving the immune mechanism: Secondary | ICD-10-CM | POA: Insufficient documentation

## 2014-05-20 DIAGNOSIS — IMO0002 Reserved for concepts with insufficient information to code with codable children: Secondary | ICD-10-CM | POA: Insufficient documentation

## 2014-05-20 DIAGNOSIS — Z8639 Personal history of other endocrine, nutritional and metabolic disease: Secondary | ICD-10-CM | POA: Insufficient documentation

## 2014-05-20 DIAGNOSIS — Z8673 Personal history of transient ischemic attack (TIA), and cerebral infarction without residual deficits: Secondary | ICD-10-CM | POA: Insufficient documentation

## 2014-05-20 DIAGNOSIS — Z79899 Other long term (current) drug therapy: Secondary | ICD-10-CM | POA: Insufficient documentation

## 2014-05-20 DIAGNOSIS — Z8619 Personal history of other infectious and parasitic diseases: Secondary | ICD-10-CM | POA: Insufficient documentation

## 2014-05-20 DIAGNOSIS — M5430 Sciatica, unspecified side: Secondary | ICD-10-CM | POA: Insufficient documentation

## 2014-05-20 NOTE — Telephone Encounter (Signed)
Pt notified of Dr. Marliss Coots comments. Pt is willing to see who ever you recommend but pt has the orange card from [employer] and she isn't sure if Dr. Joretta Bachelor will accept that, if not she will see who ever will accept her insurance, pt does not want to go back to Dr. Melton Alar

## 2014-05-20 NOTE — ED Notes (Signed)
Pt is c/o pain in her left hip that radiates down into her left leg and pain up in her left shoulder  Pt denies injury  Pt states she has been taking hot baths to try to ease the pain  Pt states pain in her hip hurts worse with ambulation

## 2014-05-20 NOTE — Telephone Encounter (Signed)
I am still waiting on info from Dr Charlestine Night -will review when it comes  As for the headaches - we need to get her back to a headache specialist for sure - does she want to go back to Dr Melton Alar or would she want to see someone else ?  I like a neurologist in [place] named Dr Joretta Bachelor- but that may be too far for her  There is a headache clinic on [address] and also we have [place] neuro also - let me know what she thinks so I can get that going asap

## 2014-05-20 NOTE — Telephone Encounter (Signed)
Request for records sent to Dr. Charlestine Night,  Pt was very upset on the phone she said that Dr. Charlestine Night told her that all her pain was in her head and pt is really upset, pt said her HA are really bad and she is in so much pain between her HA and her hip, lower back pain that she can hardly walk. Pt doesn't know what to do and thinks she may even go the to ER because she is in so much pain. Pt advise me that Dr. Charlestine Night told her that you shouldn't even be treating her for OA because she probably doesn't have it and everything is in her head. Pt doesn't know what to do, please advise

## 2014-05-21 MED ORDER — IBUPROFEN 800 MG PO TABS
800.0000 mg | ORAL_TABLET | Freq: Once | ORAL | Status: AC
  Administered 2014-05-21: 800 mg via ORAL
  Filled 2014-05-21: qty 1

## 2014-05-21 MED ORDER — OXYCODONE-ACETAMINOPHEN 5-325 MG PO TABS
1.0000 | ORAL_TABLET | Freq: Once | ORAL | Status: AC
  Administered 2014-05-21: 1 via ORAL
  Filled 2014-05-21: qty 1

## 2014-05-21 MED ORDER — IBUPROFEN 600 MG PO TABS: 600.0000 mg | ORAL_TABLET | Freq: Three times a day (TID) | ORAL | Status: DC | PRN

## 2014-05-21 MED ORDER — OXYCODONE-ACETAMINOPHEN 5-325 MG PO TABS: 1.0000 | ORAL_TABLET | ORAL | Status: DC | PRN

## 2014-05-21 NOTE — Discharge Instructions (Signed)
Sciatica Sciatica is pain, weakness, numbness, or tingling along the path of the sciatic nerve. The nerve starts in the lower back and runs down the back of each leg. The nerve controls the muscles in the lower leg and in the back of the knee, while also providing sensation to the back of the thigh, lower leg, and the sole of your foot. Sciatica is a symptom of another medical condition. For instance, nerve damage or certain conditions, such as a herniated disk or bone spur on the spine, pinch or put pressure on the sciatic nerve. This causes the pain, weakness, or other sensations normally associated with sciatica. Generally, sciatica only affects one side of the body. CAUSES   Herniated or slipped disc.  Degenerative disk disease.  A pain disorder involving the narrow muscle in the buttocks (piriformis syndrome).  Pelvic injury or fracture.  Pregnancy.  Tumor (rare). SYMPTOMS  Symptoms can vary from mild to very severe. The symptoms usually travel from the low back to the buttocks and down the back of the leg. Symptoms can include:  Mild tingling or dull aches in the lower back, leg, or hip.  Numbness in the back of the calf or sole of the foot.  Burning sensations in the lower back, leg, or hip.  Sharp pains in the lower back, leg, or hip.  Leg weakness.  Severe back pain inhibiting movement. These symptoms may get worse with coughing, sneezing, laughing, or prolonged sitting or standing. Also, being overweight may worsen symptoms. DIAGNOSIS  Your caregiver will perform a physical exam to look for common symptoms of sciatica. He or she may ask you to do certain movements or activities that would trigger sciatic nerve pain. Other tests may be performed to find the cause of the sciatica. These may include:  Blood tests.  X-rays.  Imaging tests, such as an MRI or CT scan. TREATMENT  Treatment is directed at the cause of the sciatic pain. Sometimes, treatment is not necessary  and the pain and discomfort goes away on its own. If treatment is needed, your caregiver may suggest:  Over-the-counter medicines to relieve pain.  Prescription medicines, such as anti-inflammatory medicine, muscle relaxants, or narcotics.  Applying heat or ice to the painful area.  Steroid injections to lessen pain, irritation, and inflammation around the nerve.  Reducing activity during periods of pain.  Exercising and stretching to strengthen your abdomen and improve flexibility of your spine. Your caregiver may suggest losing weight if the extra weight makes the back pain worse.  Physical therapy.  Surgery to eliminate what is pressing or pinching the nerve, such as a bone spur or part of a herniated disk. HOME CARE INSTRUCTIONS   Only take over-the-counter or prescription medicines for pain or discomfort as directed by your caregiver.  Apply ice to the affected area for 20 minutes, 3-4 times a day for the first 48-72 hours. Then try heat in the same way.  Exercise, stretch, or perform your usual activities if these do not aggravate your pain.  Attend physical therapy sessions as directed by your caregiver.  Keep all follow-up appointments as directed by your caregiver.  Do not wear high heels or shoes that do not provide proper support.  Check your mattress to see if it is too soft. A firm mattress may lessen your pain and discomfort. SEEK IMMEDIATE MEDICAL CARE IF:   You lose control of your bowel or bladder (incontinence).  You have increasing weakness in the lower back, pelvis, buttocks,   or legs.  You have redness or swelling of your back.  You have a burning sensation when you urinate.  You have pain that gets worse when you lie down or awakens you at night.  Your pain is worse than you have experienced in the past.  Your pain is lasting longer than 4 weeks.  You are suddenly losing weight without reason. MAKE SURE YOU:  Understand these  instructions.  Will watch your condition.  Will get help right away if you are not doing well or get worse. Document Released: 10/11/2001 Document Revised: 04/17/2012 Document Reviewed: 02/26/2012 Exit[employer] Patient Information 2015 [employer]. This information is not intended to replace advice given to you by your health care provider. Make sure you discuss any questions you have with your health care provider.  

## 2014-05-21 NOTE — ED Provider Notes (Signed)
CSN: [id number]     Arrival date & time 05/20/14  2303 History   First MD Initiated Contact with Patient 05/20/14 2358     Chief Complaint  Patient presents with  . Hip Pain  . Shoulder Pain    HPI Patient reports worsening pain in her left low back with radiation down her left buttock towards her left posterior leg.  She denies new weakness in arms or legs.  She states she's had this symptom intermittently before in the past but this is the most severe.  It worsened 3 days ago.  Nothing worsens or improves her pain.  Her pain is moderate in severity at this time.  She also reports some mild left scapular pain.  She was seen by her primary care doctor and rheumatologist for this.  No clear etiology was found.  No fevers or chills.  No rash.  No urinary complaints.  Denies abdominal pain.  Denies vaginal complaints.  No chest pain or shortness of breath   Past Medical History  Diagnosis Date  . Arthritis   . Depression   . Headache(784.0)   . HSV-2 infection   . Thrombocytopenia 10/01/2012  . Immune deficiency disorder   . Stroke    Past Surgical History  Procedure Laterality Date  . Partial hysterectomy  2012    due to fibroid  . Wisdom tooth extraction  1995  . Cesarean section  2008    x1  . Abdominal hysterectomy     Family History  Problem Relation Age of Onset  . Diabetes Father   . Cancer Paternal Aunt     breast  . Cancer Paternal Grandmother     colon CA   History  Substance Use Topics  . Smoking status: Never Smoker   . Smokeless tobacco: Never Used     Comment: Pt states she smokes.....but "not cigarettes"  . Alcohol Use: 1.2 oz/week    2 Glasses of wine per week     Comment: occ   OB History   Grav Para Term Preterm Abortions TAB SAB Ect Mult Living   3         3     Obstetric Comments   Pt states that she has had a lot of miscarriages. Do not know how many     Review of Systems  All other systems reviewed and are negative.     Allergies   Amoxicillin and Carafate  Home Medications   Prior to Admission medications   Medication Sig Start Date End Date Taking? Authorizing Provider  ALPRAZolam Duanne Moron) 0.25 MG tablet Take 0.25 mg by mouth daily as needed for anxiety.    Yes Historical Provider, MD  cyclobenzaprine (FLEXERIL) 10 MG tablet Take 1 tablet (10 mg total) by mouth 3 (three) times daily as needed (for headache). Watch out for sedation 05/13/14  Yes Abner Greenspan, MD  etonogestrel (IMPLANON) 68 MG IMPL implant Inject 1 each into the skin continuous.   Yes Historical Provider, MD  fluticasone (FLONASE) 50 MCG/ACT nasal spray Place 2 sprays into both nostrils daily. 04/07/14  Yes Abner Greenspan, MD  hydrocortisone cream 1 % Apply 1 application topically daily.   Yes Historical Provider, MD  meloxicam (MOBIC) 15 MG tablet Take 1 tablet (15 mg total) by mouth daily. Take with food 04/07/14  Yes Abner Greenspan, MD  PARoxetine (PAXIL) 40 MG tablet Take 40 mg by mouth every evening.    Yes Historical Provider, MD  tetrahydrozoline 0.05 %  ophthalmic solution Place 1 drop into both eyes daily as needed (for dry eyes).   Yes Historical Provider, MD  ibuprofen (ADVIL,MOTRIN) 600 MG tablet Take 1 tablet (600 mg total) by mouth every 8 (eight) hours as needed. 05/21/14   Hoy Morn, MD  oxyCODONE-acetaminophen (PERCOCET/ROXICET) 5-325 MG per tablet Take 1 tablet by mouth every 4 (four) hours as needed for severe pain. 05/21/14   Hoy Morn, MD   BP 144/82  Pulse 72  Temp(Src) 98.8 F (37.1 C) (Oral)  Resp 18  Ht 5\' 2"  (1.575 m)  Wt 207 lb (93.895 kg)  BMI 37.85 kg/m2  SpO2 99% Physical Exam  Nursing note and vitals reviewed. Constitutional: She is oriented to person, place, and time. She appears well-developed and well-nourished. No distress.  HENT:  Head: Normocephalic and atraumatic.  Eyes: EOM are normal.  Neck: Normal range of motion.  Cardiovascular: Normal rate, regular rhythm and normal heart sounds.   Pulmonary/Chest:  Effort normal and breath sounds normal.  Abdominal: Soft. She exhibits no distension. There is no tenderness.  Musculoskeletal: Normal range of motion.  No l spine tenderness.  Mild paralumbar tenderness with tenderness along the left sciatic groove.  Full range of motion bilateral hips knees and ankles.  Normal strength in major muscle groups of bilateral lower extremities.  Normal PT and DP pulses bilaterally  Neurological: She is alert and oriented to person, place, and time.  Skin: Skin is warm and dry.  Psychiatric: She has a normal mood and affect. Judgment normal.    ED Course  Procedures (including critical care time) Labs Review Labs Reviewed - No data to display  Imaging Review No results found.   EKG Interpretation None      MDM   Final diagnoses:  Sciatica, left   Likely sciatica.  Home with PCP followup and referral to orthopedics/sports medicine.  Patient understands to return to the ER for new or worsening symptoms.  Home with NSAIDs and Percocet.    Hoy Morn, MD 05/21/14 [phone]

## 2014-05-23 ENCOUNTER — Telehealth: Payer: Self-pay | Admitting: Family Medicine

## 2014-05-23 NOTE — Telephone Encounter (Signed)
Then she needs to f/u with me for a headache visit so I can answer the questions- she really needs a neurologist but as of now we cannot find one who takes the orange card Dana Greer has tried hard to find someone) Please let pt know and schedule an appt with me for headaches

## 2014-05-23 NOTE — Telephone Encounter (Signed)
Spoke with Lesleigh Noe and pt's [employer], and they advise me pt is trying to do an appeal for disability and they need that form asap before her appeal time is over, please advise

## 2014-05-23 NOTE — Telephone Encounter (Signed)
Let her know that I am trying to get her in to a neurologist for her headaches - in the process of finding out who will take her insurance - sending her for headaches (she no longer sees previous provider for her headaches)

## 2014-05-23 NOTE — Telephone Encounter (Signed)
Margie from [employer] called requesting the status of the questionnaire that was sent on 05/19/14 regarding Dana Greer.

## 2014-05-23 NOTE — Telephone Encounter (Signed)
Left voicemail requesting pt to call office 

## 2014-05-23 NOTE — Telephone Encounter (Signed)
Left detailed message on voicemail and phoned Margie and left a message.

## 2014-05-23 NOTE — Telephone Encounter (Signed)
Dr Elmon Else note came - he sited mild arthritis chenges in right knee and spine with a bunion in R foot-no indication of autoimmune disease (thankfully)  He mentioned her trouble sleeping - and that is probably adding to her headaches/ no doubt  The next option is to set her up with orthopedics (as opposed to rheumatology)- to evaluate further and see if anything can be done for the pain   I know the headaches are a big problem- and we are working on finding a neurologist who takes the orange card (that is next)

## 2014-05-26 ENCOUNTER — Ambulatory Visit (INDEPENDENT_AMBULATORY_CARE_PROVIDER_SITE_OTHER): Payer: No Typology Code available for payment source | Admitting: Family Medicine

## 2014-05-26 ENCOUNTER — Encounter: Payer: Self-pay | Admitting: Family Medicine

## 2014-05-26 VITALS — BP 138/86 | HR 74 | Temp 98.5°F | Ht 62.5 in | Wt 206.0 lb

## 2014-05-26 DIAGNOSIS — M545 Low back pain, unspecified: Secondary | ICD-10-CM

## 2014-05-26 DIAGNOSIS — F4323 Adjustment disorder with mixed anxiety and depressed mood: Secondary | ICD-10-CM

## 2014-05-26 DIAGNOSIS — G43109 Migraine with aura, not intractable, without status migrainosus: Secondary | ICD-10-CM

## 2014-05-26 DIAGNOSIS — M2550 Pain in unspecified joint: Secondary | ICD-10-CM

## 2014-05-26 MED ORDER — PAROXETINE HCL 40 MG PO TABS: 40.0000 mg | ORAL_TABLET | Freq: Every day | ORAL | Status: DC

## 2014-05-26 NOTE — Progress Notes (Signed)
Subjective:    Patient ID: Dana Greer, female    DOB: May 20, 1971, 43 y.o.   MRN: [id number]  HPI Here for f/u of headaches and generalized pain - to discuss her disability   Saw Dr Charlestine Night - she was unhappy with him Told her "it was all in her head"  Found "nothing wrong"  Needs ref to ortho for ongoing joint and back pain   Went to ER for back pain also She had a bad episode of sciatica  Was inst to go ahead and see sport med - she has name and number  She would like to try this contact before ref to ortho who takes orange card   Not seeing Dr Melton Alar anymore for her headaches- she has no ins and only has the orange card   Has tried different medicines- and had side effects like - disorientation   She cannot work due to headaches - about a year and a half  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 Too much uterus or cervix left)- on nexplanon for that and it is helping  This timing did coincide with her headaches She sees Everett Graff for gyn   Has had a psychiatrist in the past  Depression and anxiety and insomnia  Was given xanax 4 times per day (not working and not on it )  Also paxil 40 mg once daily --she is out of paxil --really needs it !- cannot afford to go back to her psychiatrist who does not take the orange card   Patient Active Problem List   Diagnosis Date Noted  . Pain, joint, multiple sites 05/26/2014  . Viral URI with cough 04/07/2014  . ETD (eustachian tube dysfunction) 04/07/2014  . Vertiginous migraine 02/03/2014  . Right hand pain 09/24/2013  . Coccyx  pain 03/27/2013  . Sacroiliac dysfunction 03/27/2013  . Thrombocytopenia 10/01/2012  . Intractable migraine 08/31/2012  . Osteoarthritis 05/11/2012  . Female bladder prolapse 05/11/2012  . Hypokalemia 05/11/2012  . Allergic conjunctivitis 08/11/2011  . ADJ DISORDER WITH MIXED ANXIETY & DEPRESSED MOOD 06/30/2008  . HYPERHIDROSIS 01/25/2008  . BACK PAIN, LUMBAR, CHRONIC 10/16/2007  . REACTION, ACUTE STRESS W/EMOTIONAL DSTURB 08/01/2007  . HYPERGLYCEMIA 08/01/2007  . ELEVATED BLOOD PRESSURE WITHOUT DIAGNOSIS OF HYPERTENSION 08/01/2007  . GOITER NOS 07/11/2007  . POLYCYSTIC OVARIAN DISEASE 05/24/2007  . HIRSUTISM 05/24/2007  . ARTHROPATHY NOS, MULTIPLE SITES 05/24/2007   Past Medical History  Diagnosis Date  . Arthritis   . Depression   . Headache(784.0)   . HSV-2 infection   . Thrombocytopenia 10/01/2012  . Immune deficiency disorder   . Stroke    Past Surgical History  Procedure Laterality Date  . Partial hysterectomy  2012    due to fibroid  . Wisdom tooth extraction  1995  . Cesarean section  2008    x1  . Abdominal hysterectomy     History  Substance Use Topics  . Smoking status: Never Smoker   . Smokeless tobacco: Never Used     Comment:  Pt states she smokes.....but "not cigarettes"  . Alcohol Use: 1.2 oz/week    2 Glasses of wine per week     Comment: occ   Family History  Problem Relation Age of Onset  . Diabetes Father   . Cancer Paternal Aunt     breast  . Cancer Paternal Grandmother     colon CA   Allergies  Allergen Reactions  . Amoxicillin Other (See Comments)    Yeast infection   . Carafate [Sucralfate] Nausea And Vomiting   Current Outpatient Prescriptions on File Prior to Visit  Medication Sig Dispense Refill  . etonogestrel (IMPLANON) 68 MG IMPL implant Inject 1 each into the skin continuous.      . fluticasone (FLONASE) 50 MCG/ACT nasal spray Place 2 sprays into both nostrils daily.  16 g  6  . hydrocortisone cream 1 % Apply 1 application  topically daily.      Marland Kitchen ibuprofen (ADVIL,MOTRIN) 600 MG tablet Take 1 tablet (600 mg total) by mouth every 8 (eight) hours as needed.  15 tablet  0  . oxyCODONE-acetaminophen (PERCOCET/ROXICET) 5-325 MG per tablet Take 1 tablet by mouth every 4 (four) hours as needed for severe pain.  20 tablet  0  . tetrahydrozoline 0.05 % ophthalmic solution Place 1 drop into both eyes daily as needed (for dry eyes).      . cyclobenzaprine (FLEXERIL) 10 MG tablet Take 1 tablet (10 mg total) by mouth 3 (three) times daily as needed (for headache). Watch out for sedation  30 tablet  0   No current facility-administered medications on file prior to visit.    Review of Systems    Review of Systems  Constitutional: Negative for fever, appetite change, and unexpected weight change. pos for fatigue ENT neg for sinus congestion or pain or st  Eyes: Negative for pain and visual disturbance. pos for eye strain and ha when she tries to use computer or read  Respiratory: Negative for cough and shortness of breath.   Cardiovascular: Negative for cp or palpitations    Gastrointestinal: Negative for nausea, diarrhea and constipation.  Genitourinary: Negative for urgency and frequency.  Skin: Negative for pallor or rash   Neurological: Negative for weakness, light-headedness, numbness and pos for disabling  headaches.  Hematological: Negative for adenopathy. Does not bruise/bleed easily.  Psychiatric/Behavioral: pos for anxiety and depression       Objective:   Physical Exam  Constitutional: She appears well-developed and well-nourished. No distress.  obese and well appearing   HENT:  Head: Normocephalic and atraumatic.  Mouth/Throat: Oropharynx is clear and moist.  Eyes: Conjunctivae and EOM are normal. Pupils are equal, round, and reactive to light. Right eye exhibits no discharge. Left eye exhibits no discharge. No scleral icterus.  Neck: Normal range of motion. Neck supple. Carotid bruit is not present.    Neurological: She is alert. She has normal reflexes. She exhibits normal muscle tone. Coordination normal.  Skin: Skin is warm and dry. No rash noted. No erythema. No pallor.  Psychiatric: Her speech is normal and behavior is normal. Thought content normal. Her mood appears anxious. Her affect is not blunt, not labile and not inappropriate. She does not exhibit a depressed mood.          Assessment & Plan:

## 2014-05-26 NOTE — Assessment & Plan Note (Signed)
Ongoing -for 2 out of 4 weeks a month and totally disabling Has failed many acute and prophylactic meds and tx from DR [facility] neurology ref but cannot find a doctor who takes orange card/ who will waive fee  Filled out law paperwork for disability -stating that she cannot work due to dizziness and ha  Pt will return for a headache visit to see what we can try to treat them- asked her to bring ha diary and also a list of meds not tolerated

## 2014-05-26 NOTE — Assessment & Plan Note (Signed)
Rev ED note  Sciatica Pt will call sport med office - if she is not able to go w/o insurance -will have to find orthopedic doctor that takes the orange card

## 2014-05-26 NOTE — Progress Notes (Signed)
Pre visit review using our clinic review tool, if applicable. No additional management support is needed unless otherwise documented below in the visit note. 

## 2014-05-26 NOTE — Assessment & Plan Note (Signed)
Pt was unhappy with visit with rheumatologist She is going to check in with a recommended sport med office re: acute on chronic pain and oa -if not able to afford that we will ref to an orthopedic doctor that takes the orange card

## 2014-05-26 NOTE — Assessment & Plan Note (Signed)
Pt cannot afford to return to her psychiatrist with no insurance  Will refill her paxil at 40 mg for anx and sleep

## 2014-05-26 NOTE — Telephone Encounter (Signed)
Addressed at [facility] with Dr. Glori Bickers

## 2014-05-26 NOTE — Patient Instructions (Signed)
Make a follow up appt please (30 minute) to talk about headaches  Call the sport med office - if they do not take the orange card then please call me for orthopedic referral  I am refilling paxil

## 2014-07-14 ENCOUNTER — Telehealth: Payer: Self-pay | Admitting: Family Medicine

## 2014-07-14 DIAGNOSIS — M5441 Lumbago with sciatica, right side: Secondary | ICD-10-CM

## 2014-07-14 DIAGNOSIS — M5442 Lumbago with sciatica, left side: Principal | ICD-10-CM

## 2014-07-14 NOTE — Telephone Encounter (Signed)
Patient Information:  Caller Name: Lamari  Phone: [phone]  Patient: Dana Greer  Gender: Female  DOB: [birth year]  Age: 43 Years  PCP: [name], [facility], [facility])  Pregnant: No  Office Follow Up:  Does the office need to follow up with this patient?: Yes  Instructions For The Office: Please call and advise   Symptoms  Reason For Call & Symptoms: Pt is calling to see what else she can do with the sciatica. Pt has an episode in her left leg to her thigh/hip. Onset 2 days ago. Her last episode of this was in the ED on 05/20/14. Discussed at [place] with Dr. Glori Bickers on 05/26/14. Pt was to have an ortho referral or sports medicine but hasn't gotten one yet. Pt wants to know from Dr. Glori Bickers what she can do other than take meds. Last week she was at 2 funerals with long distance travel in the car 8 and 10 hours. RN advised if the pain gets too much tonight to go to the ED. Agreed to send request to Dr. Glori Bickers (who may not get this until tomorrow).  Reviewed Health History In EMR: N/A  Reviewed Medications In EMR: N/A  Reviewed Allergies In EMR: N/A  Reviewed Surgeries / Procedures: N/A  Date of Onset of Symptoms: 07/12/2014 OB / GYN:  LMP: Unknown  Guideline(s) Used:  Leg Pain  Disposition Per Guideline:   Go to ED Now (or to Office with PCP Approval)  Reason For Disposition Reached:   Recent long-distance travel with prolonged time in car, bus, plane, or train (i.e., within past 2 weeks; 6 or more hours duration)  Advice Given:  N/A  Patient Refused Recommendation:  Patient Will Follow Up With Office Later  wants to know per Dr. Glori Bickers what else can be done other than pain pills which didn't help.

## 2014-07-15 NOTE — Telephone Encounter (Signed)
Referral done

## 2014-07-15 NOTE — Telephone Encounter (Signed)
Pt does want to see an orthopedic doctor, pt said she spoke with some here (?[facility]) and they advised her that there is an ortho doc that takes her orange card, please put referral in

## 2014-07-15 NOTE — Telephone Encounter (Signed)
I would ultimately like her to see sport med or orthopedics  Does she want me to refer her ? I know she needs to find someone who takes the orange card if possible

## 2014-08-04 ENCOUNTER — Telehealth: Payer: Self-pay | Admitting: Family Medicine

## 2014-08-04 DIAGNOSIS — Z01419 Encounter for gynecological examination (general) (routine) without abnormal findings: Secondary | ICD-10-CM

## 2014-08-04 DIAGNOSIS — Z1231 Encounter for screening mammogram for malignant neoplasm of breast: Secondary | ICD-10-CM

## 2014-08-04 NOTE — Telephone Encounter (Signed)
Pt called requesting referral to GYN and mammogram. Pt has the orange card insurance so may be limited on where she can go. Pt states it will probably be [facility] and she is okay with that. When she had regular insurance she had MM at [facility] and was abnormal.   Pt call back # [phone]

## 2014-08-04 NOTE — Telephone Encounter (Signed)
I went ahead and did referrals

## 2014-08-13 ENCOUNTER — Emergency Department (HOSPITAL_COMMUNITY)
Admission: EM | Admit: 2014-08-13 | Discharge: 2014-08-13 | Disposition: A | Discharge status: Home / Self Care | Payer: No Typology Code available for payment source | Attending: Emergency Medicine | Admitting: Emergency Medicine

## 2014-08-13 ENCOUNTER — Encounter (HOSPITAL_COMMUNITY): Payer: Self-pay | Admitting: Emergency Medicine

## 2014-08-13 ENCOUNTER — Emergency Department (HOSPITAL_COMMUNITY): Payer: No Typology Code available for payment source

## 2014-08-13 DIAGNOSIS — Z79899 Other long term (current) drug therapy: Secondary | ICD-10-CM | POA: Insufficient documentation

## 2014-08-13 DIAGNOSIS — Z8619 Personal history of other infectious and parasitic diseases: Secondary | ICD-10-CM | POA: Insufficient documentation

## 2014-08-13 DIAGNOSIS — M779 Enthesopathy, unspecified: Secondary | ICD-10-CM

## 2014-08-13 DIAGNOSIS — M65841 Other synovitis and tenosynovitis, right hand: Secondary | ICD-10-CM | POA: Insufficient documentation

## 2014-08-13 DIAGNOSIS — Z862 Personal history of diseases of the blood and blood-forming organs and certain disorders involving the immune mechanism: Secondary | ICD-10-CM | POA: Insufficient documentation

## 2014-08-13 DIAGNOSIS — Z8673 Personal history of transient ischemic attack (TIA), and cerebral infarction without residual deficits: Secondary | ICD-10-CM | POA: Insufficient documentation

## 2014-08-13 DIAGNOSIS — Z7951 Long term (current) use of inhaled steroids: Secondary | ICD-10-CM | POA: Insufficient documentation

## 2014-08-13 DIAGNOSIS — Z880 Allergy status to penicillin: Secondary | ICD-10-CM | POA: Insufficient documentation

## 2014-08-13 DIAGNOSIS — M1990 Unspecified osteoarthritis, unspecified site: Secondary | ICD-10-CM | POA: Insufficient documentation

## 2014-08-13 DIAGNOSIS — F329 Major depressive disorder, single episode, unspecified: Secondary | ICD-10-CM | POA: Insufficient documentation

## 2014-08-13 MED ORDER — IBUPROFEN 800 MG PO TABS: 800.0000 mg | ORAL_TABLET | Freq: Three times a day (TID) | ORAL | Status: DC | PRN

## 2014-08-13 MED ORDER — HYDROCODONE-ACETAMINOPHEN 5-325 MG PO TABS: 1.0000 | ORAL_TABLET | Freq: Four times a day (QID) | ORAL | Status: DC | PRN

## 2014-08-13 MED ORDER — KETOROLAC TROMETHAMINE 60 MG/2ML IM SOLN
60.0000 mg | Freq: Once | INTRAMUSCULAR | Status: AC
  Administered 2014-08-13: 60 mg via INTRAMUSCULAR
  Filled 2014-08-13: qty 2

## 2014-08-13 NOTE — ED Notes (Signed)
Pt having right hand pain x 2 days. Unknown if injury. Hand slightly swollen.

## 2014-08-13 NOTE — ED Provider Notes (Signed)
CSN: [id number]     Arrival date & time 08/13/14  0813 History  This chart was scribed for non-physician practitioner, Irena Cords, PA-C working with Evelina Bucy, MD by Frederich Balding, ED scribe. This patient was seen in room TR06C/TR06C and the patient's care was started at 9:22 AM.   Chief Complaint  Patient presents with  . Hand Pain   The history is provided by the patient. No language interpreter was used.   HPI Comments: Dana Greer is a 43 y.o. female who presents to the Emergency Department complaining of right hand pain that started 2 days ago. Pain is worse in her fifth finger. Reports associated mild swelling. Denies injury or history of similar pain. Extending her fingers and making a fist worsen the pain. Pt is right hand dominant.   Past Medical History  Diagnosis Date  . Arthritis   . Depression   . Headache(784.0)   . HSV-2 infection   . Thrombocytopenia 10/01/2012  . Immune deficiency disorder   . Stroke    Past Surgical History  Procedure Laterality Date  . Partial hysterectomy  2012    due to fibroid  . Wisdom tooth extraction  1995  . Cesarean section  2008    x1  . Abdominal hysterectomy     Family History  Problem Relation Age of Onset  . Diabetes Father   . Cancer Paternal Aunt     breast  . Cancer Paternal Grandmother     colon CA   History  Substance Use Topics  . Smoking status: Never Smoker   . Smokeless tobacco: Never Used     Comment: Pt states she smokes.....but "not cigarettes"  . Alcohol Use: 1.2 oz/week    2 Glasses of wine per week     Comment: occ   OB History   Grav Para Term Preterm Abortions TAB SAB Ect Mult Living   3         3     Obstetric Comments   Pt states that she has had a lot of miscarriages. Do not know how many     Review of Systems All other systems negative except as documented in the HPI. All pertinent positives and negatives as reviewed in the HPI.  Allergies  Amoxicillin and Carafate  Home  Medications   Prior to Admission medications   Medication Sig Start Date End Date Taking? Authorizing Provider  ALPRAZolam Duanne Moron) 1 MG tablet Take 0.5 mg by mouth 2 (two) times daily as needed for anxiety.    Yes Historical Provider, MD  cyclobenzaprine (FLEXERIL) 10 MG tablet Take 10 mg by mouth daily as needed for muscle spasms.   Yes Historical Provider, MD  etonogestrel (IMPLANON) 68 MG IMPL implant Inject 1 each into the skin continuous.   Yes Historical Provider, MD  fluticasone (FLONASE) 50 MCG/ACT nasal spray Place 2 sprays into both nostrils daily. 04/07/14  Yes Abner Greenspan, MD  PARoxetine (PAXIL) 40 MG tablet Take 1 tablet (40 mg total) by mouth daily. 05/26/14  Yes Abner Greenspan, MD  tetrahydrozoline 0.05 % ophthalmic solution Place 1 drop into both eyes daily as needed (for dry eyes).   Yes Historical Provider, MD   BP 166/86  Pulse 65  Temp(Src) 98.1 F (36.7 C) (Oral)  Resp 18  SpO2 100%  Physical Exam  Nursing note and vitals reviewed. Constitutional: She is oriented to person, place, and time. She appears well-developed and well-nourished. No distress.  HENT:  Head: Normocephalic  and atraumatic.  Eyes: Conjunctivae and EOM are normal.  Neck: Neck supple. No tracheal deviation present.  Cardiovascular: Normal rate.   Pulmonary/Chest: Effort normal. No respiratory distress.  Musculoskeletal: Normal range of motion.  Tenderness at the palmar aspect of the base of the fourth and fifth digits over tendon. Pain with ROM.   Neurological: She is alert and oriented to person, place, and time.  Skin: Skin is warm and dry.  Psychiatric: She has a normal mood and affect. Her behavior is normal.    ED Course  Procedures (including critical care time)  DIAGNOSTIC STUDIES: Oxygen Saturation is 100% on RA, normal by my interpretation.    COORDINATION OF CARE: 9:24 AM-Discussed treatment plan which includes pain medications with pt at bedside and pt agreed to plan. Will give  pt a referral to a hand specialist and advised her to follow up.   Labs Review Labs Reviewed - No data to display  Imaging Review Dg Hand Complete Right  08/13/2014   CLINICAL DATA:  Swelling of the fifth digit.  EXAM: RIGHT HAND - COMPLETE 3+ VIEW  COMPARISON:  Right hand radiographs 09/24/2013.  FINDINGS: Soft tissue swelling is present in the fifth digit. There is no focal or acute osseous abnormality. The joints are located. No radiopaque foreign body is evident.  IMPRESSION: 1. Mild soft swelling in the fifth digit without an acute or focal osseous abnormality.   Electronically Signed   By: Lawrence Santiago M.D.   On: 08/13/2014 08:46     I personally performed the services described in this documentation, which was scribed in my presence. The recorded information has been reviewed and is accurate.  Brent General, PA-C 08/14/14 1546

## 2014-08-13 NOTE — Discharge Instructions (Signed)
Return here as needed. Follow up with the hand specialist provided. Ice and heat on your hand

## 2014-08-15 NOTE — ED Provider Notes (Signed)
Medical screening examination/treatment/procedure(s) were performed by non-physician practitioner and as supervising physician I was immediately available for consultation/collaboration.   EKG Interpretation None        Evelina Bucy, MD 08/15/14 [phone]

## 2014-08-20 ENCOUNTER — Telehealth: Payer: Self-pay

## 2014-08-20 MED ORDER — VALACYCLOVIR HCL 500 MG PO TABS: 500.0000 mg | ORAL_TABLET | Freq: Every day | ORAL | Status: DC

## 2014-08-20 NOTE — Telephone Encounter (Signed)
Pt left v/m requesting refill valtrex 500 mg taking one daily; not on current med list and not sure if Dr Glori Bickers has prescribed before. Pt request cb. [facility] outpt pharmacy.Please advise.

## 2014-08-20 NOTE — Telephone Encounter (Signed)
It is on her past med hx list  Can refill times 3  Thanks

## 2014-08-20 NOTE — Telephone Encounter (Signed)
[name] notified prescription has been sent to [facility].

## 2014-09-01 ENCOUNTER — Encounter (HOSPITAL_COMMUNITY): Payer: Self-pay | Admitting: Emergency Medicine

## 2014-11-19 ENCOUNTER — Ambulatory Visit: Payer: No Typology Code available for payment source | Admitting: Family Medicine

## 2014-11-19 ENCOUNTER — Telehealth: Payer: Self-pay

## 2014-11-19 MED ORDER — FLUCONAZOLE 150 MG PO TABS: 150.0000 mg | ORAL_TABLET | Freq: Once | ORAL | Status: DC

## 2014-11-19 NOTE — Telephone Encounter (Signed)
If she wants to try a diflucan first before coming in - I am open to that  Please call in diflucan 150 mg 1 po times 1  #1 no ref --if she wants that  Then cancel appt and she can reschedule if not improving in 3-4 d

## 2014-11-19 NOTE — Telephone Encounter (Signed)
Pt left v/m; pt has yeast infection and has tried monistat OTC with no relief; pt request med for yeast infection. Spoke with pt; has vaginal discharge with no itching;pt scheduled appt today at 2:15 with Dr Glori Bickers.

## 2014-11-19 NOTE — Telephone Encounter (Signed)
Rx sent to pharmacy and pt notified, appt for today cancelled

## 2014-11-28 ENCOUNTER — Emergency Department (HOSPITAL_COMMUNITY)
Admission: EM | Admit: 2014-11-28 | Discharge: 2014-11-28 | Disposition: A | Discharge status: Home / Self Care | Payer: No Typology Code available for payment source | Attending: Emergency Medicine | Admitting: Emergency Medicine

## 2014-11-28 ENCOUNTER — Encounter (HOSPITAL_COMMUNITY): Payer: Self-pay | Admitting: *Deleted

## 2014-11-28 ENCOUNTER — Telehealth: Payer: Self-pay | Admitting: Family Medicine

## 2014-11-28 DIAGNOSIS — Z9049 Acquired absence of other specified parts of digestive tract: Secondary | ICD-10-CM | POA: Insufficient documentation

## 2014-11-28 DIAGNOSIS — F329 Major depressive disorder, single episode, unspecified: Secondary | ICD-10-CM | POA: Insufficient documentation

## 2014-11-28 DIAGNOSIS — Z8619 Personal history of other infectious and parasitic diseases: Secondary | ICD-10-CM | POA: Insufficient documentation

## 2014-11-28 DIAGNOSIS — Z862 Personal history of diseases of the blood and blood-forming organs and certain disorders involving the immune mechanism: Secondary | ICD-10-CM | POA: Insufficient documentation

## 2014-11-28 DIAGNOSIS — M1990 Unspecified osteoarthritis, unspecified site: Secondary | ICD-10-CM | POA: Insufficient documentation

## 2014-11-28 DIAGNOSIS — R197 Diarrhea, unspecified: Secondary | ICD-10-CM | POA: Insufficient documentation

## 2014-11-28 DIAGNOSIS — R1084 Generalized abdominal pain: Secondary | ICD-10-CM | POA: Insufficient documentation

## 2014-11-28 DIAGNOSIS — Z7951 Long term (current) use of inhaled steroids: Secondary | ICD-10-CM | POA: Insufficient documentation

## 2014-11-28 DIAGNOSIS — R112 Nausea with vomiting, unspecified: Secondary | ICD-10-CM | POA: Insufficient documentation

## 2014-11-28 DIAGNOSIS — Z8673 Personal history of transient ischemic attack (TIA), and cerebral infarction without residual deficits: Secondary | ICD-10-CM | POA: Insufficient documentation

## 2014-11-28 DIAGNOSIS — Z880 Allergy status to penicillin: Secondary | ICD-10-CM | POA: Insufficient documentation

## 2014-11-28 DIAGNOSIS — Z79899 Other long term (current) drug therapy: Secondary | ICD-10-CM | POA: Insufficient documentation

## 2014-11-28 DIAGNOSIS — Z90710 Acquired absence of both cervix and uterus: Secondary | ICD-10-CM | POA: Insufficient documentation

## 2014-11-28 LAB — URINALYSIS, ROUTINE W REFLEX MICROSCOPIC
Bilirubin Urine: NEGATIVE
GLUCOSE, UA: 100 mg/dL — AB
KETONES UR: 40 mg/dL — AB
LEUKOCYTES UA: NEGATIVE
NITRITE: NEGATIVE
PH: 8 (ref 5.0–8.0)
Protein, ur: 30 mg/dL — AB
Specific Gravity, Urine: 1.016 (ref 1.005–1.030)
UROBILINOGEN UA: 0.2 mg/dL (ref 0.0–1.0)

## 2014-11-28 LAB — CBC WITH DIFFERENTIAL/PLATELET
BASOS ABS: 0 10*3/uL (ref 0.0–0.1)
BASOS PCT: 0 % (ref 0–1)
Eosinophils Absolute: 0 10*3/uL (ref 0.0–0.7)
Eosinophils Relative: 0 % (ref 0–5)
HEMATOCRIT: 41.7 % (ref 36.0–46.0)
Hemoglobin: 14.3 g/dL (ref 12.0–15.0)
Lymphocytes Relative: 14 % (ref 12–46)
Lymphs Abs: 0.9 10*3/uL (ref 0.7–4.0)
MCH: 31.7 pg (ref 26.0–34.0)
MCHC: 34.3 g/dL (ref 30.0–36.0)
MCV: 92.5 fL (ref 78.0–100.0)
MONO ABS: 0.3 10*3/uL (ref 0.1–1.0)
Monocytes Relative: 5 % (ref 3–12)
NEUTROS PCT: 81 % — AB (ref 43–77)
Neutro Abs: 5.3 10*3/uL (ref 1.7–7.7)
Platelets: 152 10*3/uL (ref 150–400)
RBC: 4.51 MIL/uL (ref 3.87–5.11)
RDW: 13.5 % (ref 11.5–15.5)
WBC: 6.5 10*3/uL (ref 4.0–10.5)

## 2014-11-28 LAB — URINE MICROSCOPIC-ADD ON

## 2014-11-28 LAB — LIPASE, BLOOD: LIPASE: 23 U/L (ref 11–59)

## 2014-11-28 LAB — COMPREHENSIVE METABOLIC PANEL
ALBUMIN: 4.6 g/dL (ref 3.5–5.2)
ALT: 22 U/L (ref 0–35)
ANION GAP: 11 (ref 5–15)
AST: 28 U/L (ref 0–37)
Alkaline Phosphatase: 48 U/L (ref 39–117)
BILIRUBIN TOTAL: 0.8 mg/dL (ref 0.3–1.2)
BUN: 8 mg/dL (ref 6–23)
CALCIUM: 9.8 mg/dL (ref 8.4–10.5)
CHLORIDE: 104 mmol/L (ref 96–112)
CO2: 22 mmol/L (ref 19–32)
Creatinine, Ser: 1.06 mg/dL (ref 0.50–1.10)
GFR calc Af Amer: 73 mL/min — ABNORMAL LOW (ref >90)
GFR calc non Af Amer: 63 mL/min — ABNORMAL LOW (ref >90)
Glucose, Bld: 157 mg/dL — ABNORMAL HIGH (ref 70–99)
Potassium: 3.1 mmol/L — ABNORMAL LOW (ref 3.5–5.1)
SODIUM: 137 mmol/L (ref 135–145)
Total Protein: 7.9 g/dL (ref 6.0–8.3)

## 2014-11-28 MED ORDER — PROMETHAZINE HCL 25 MG PO TABS: 25.0000 mg | ORAL_TABLET | Freq: Four times a day (QID) | ORAL | Status: DC | PRN

## 2014-11-28 MED ORDER — SODIUM CHLORIDE 0.9 % IV BOLUS (SEPSIS)
1000.0000 mL | Freq: Once | INTRAVENOUS | Status: AC
  Administered 2014-11-28: 1000 mL via INTRAVENOUS

## 2014-11-28 MED ORDER — ONDANSETRON HCL 4 MG/2ML IJ SOLN
4.0000 mg | Freq: Once | INTRAMUSCULAR | Status: AC
  Administered 2014-11-28: 4 mg via INTRAVENOUS
  Filled 2014-11-28: qty 2

## 2014-11-28 MED ORDER — MORPHINE SULFATE 4 MG/ML IJ SOLN
4.0000 mg | Freq: Once | INTRAMUSCULAR | Status: AC
Start: 2014-11-28 — End: 2014-11-28
  Administered 2014-11-28: 4 mg via INTRAVENOUS
  Filled 2014-11-28: qty 1

## 2014-11-28 MED ORDER — PROMETHAZINE HCL 25 MG/ML IJ SOLN
25.0000 mg | Freq: Once | INTRAMUSCULAR | Status: AC
  Administered 2014-11-28: 25 mg via INTRAVENOUS
  Filled 2014-11-28: qty 1

## 2014-11-28 NOTE — ED Notes (Signed)
Pt requesting nausea medication. Reports zofran "took the edge off." PA aware. Orders placed. Awaiting medication for pharmacy

## 2014-11-28 NOTE — Discharge Instructions (Signed)
Please read and follow all provided instructions.  Your diagnoses today include:  1. Nausea vomiting and diarrhea   2. Generalized abdominal pain     Tests performed today include:  Blood counts and electrolytes  Blood tests to check liver and kidney function  Blood tests to check pancreas function  Urine test to look for infection  Vital signs. See below for your results today.   Medications prescribed:   Phenergan (promethazine) - for nausea and vomiting  Take any prescribed medications only as directed.  Home care instructions:   Follow any educational materials contained in this packet.  It would probably be best to avoid trazodone since this seems to be a trigger of your symptoms.    Your abdominal pain, nausea, vomiting, and diarrhea may be caused by a viral gastroenteritis also called 'stomach flu'. You should rest for the next several days. Keep drinking plenty of fluids and use the medicine for nausea as directed.    Drink clear liquids for the next 24 hours and introduce solid foods slowly after 24 hours using the b.r.a.t. diet (Bananas, Rice, Applesauce, Toast, Yogurt).    Follow-up instructions: Please follow-up with your primary care provider in the next 3 days for further evaluation of your symptoms. If you are not feeling better in 48 hours you may have a condition that is more serious and you need re-evaluation.   Return instructions:  SEEK IMMEDIATE MEDICAL ATTENTION IF:  If you have pain that does not go away or becomes severe   A temperature above 101F develops   Repeated vomiting occurs (multiple episodes)   If you have pain that becomes localized to portions of the abdomen. The right side could possibly be appendicitis. In an adult, the left lower portion of the abdomen could be colitis or diverticulitis.   Blood is being passed in stools or vomit (bright red or black tarry stools)   You develop chest pain, difficulty breathing, dizziness or  fainting, or become confused, poorly responsive, or inconsolable (young children)  If you have any other emergent concerns regarding your health  Additional Information: Abdominal (belly) pain can be caused by many things. Your caregiver performed an examination and possibly ordered blood/urine tests and imaging (CT scan, x-rays, ultrasound). Many cases can be observed and treated at home after initial evaluation in the emergency department. Even though you are being discharged home, abdominal pain can be unpredictable. Therefore, you need a repeated exam if your pain does not resolve, returns, or worsens. Most patients with abdominal pain don't have to be admitted to the hospital or have surgery, but serious problems like appendicitis and gallbladder attacks can start out as nonspecific pain. Many abdominal conditions cannot be diagnosed in one visit, so follow-up evaluations are very important.  Your vital signs today were: BP 118/95 mmHg   Pulse 81   Temp(Src) 97.9 F (36.6 C) (Oral)   Resp 16   SpO2 100% If your blood pressure (bp) was elevated above 135/85 this visit, please have this repeated by your doctor within one month. --------------

## 2014-11-28 NOTE — Telephone Encounter (Signed)
Patient Name: Dana Greer  DOB: Nov 11, 1970    Initial Comment Caller states she gets hot cold and abd pain. She is nauseas.    Nurse Assessment  Nurse: Mallie Mussel, RN, Alveta Heimlich Date/Time [name] Time): 11/28/2014 8:21:22 AM  Confirm and document reason for call. If symptomatic, describe symptoms. ---Caller states that beginning around 2AM, she has constant abdominal pain. She is unable to tell me if it is upper or lower abdominal pain. She rates her pain as 10 on 0-10 scale. She has vomiting and diarrhea this morning. Unable to tell me how many times she vomited. She has urinated in the last 12 hours.  Has the patient traveled out of the country within the last 30 days? ---No  Does the patient require triage? ---Yes  Related visit to physician within the last 2 weeks? ---No  Does the PT have any chronic conditions? (i.e. diabetes, asthma, etc.) ---No  Did the patient indicate they were pregnant? ---No     Guidelines    Guideline Title Affirmed Question Affirmed Notes  Abdominal Pain - Female [1] SEVERE pain (e.g., excruciating) AND [2] present > 1 hour    Final Disposition User   Go to ED Now Mallie Mussel, RN, Alveta Heimlich

## 2014-11-28 NOTE — Telephone Encounter (Signed)
Agree with advisement  I will be on the look out for ED records

## 2014-11-28 NOTE — Telephone Encounter (Signed)
PLEASE NOTE: All timestamps contained within this report are represented as [place] Standard Time. CONFIDENTIALTY NOTICE: This fax transmission is intended only for the addressee. It contains information that is legally privileged, confidential or otherwise protected from use or disclosure. If you are not the intended recipient, you are strictly prohibited from reviewing, disclosing, copying using or disseminating any of this information or taking any action in reliance on or regarding this information. If you have received this fax in error, please notify us immediately by telephone so that we can arrange for its return to [place]. Phone: [phone], Toll-Free: [phone], Fax: [phone] Page: 1 of 2 Call Id: [id number] [place] Patient Name: Dana Greer Gender: Female DOB: 27-Apr-1971 Age: 44 Y 2 M 10 D Return Phone Number: [phone] (Primary) Address: City/State/Zip: [place] Client [facility] Client Client Site [place], [place] Contact Type Call Call Type Triage / Clinical Relationship To Patient Self Appointment Disposition EMR Appointment Not Necessary Return Phone Number [phone] (Primary) Chief Complaint Abdominal Pain Initial Comment Caller states she gets hot cold and abd pain. She is nauseas. PreDisposition Go to ED Info pasted into Epic Yes Nurse Assessment Nurse: Mallie Mussel, RN, Alveta Heimlich Date/Time [name] Time): 11/28/2014 8:21:22 AM Confirm and document reason for call. If symptomatic, describe symptoms. ---Caller states that beginning around 2AM, she has constant abdominal pain. She is unable to tell me if it is upper or lower abdominal pain. She rates her pain as 10 on 0-10 scale. She has vomiting and diarrhea this morning. Unable to tell me how many times she vomited. She has urinated in the last 12  hours. Has the patient traveled out of the country within the last 30 days? ---No Does the patient require triage? ---Yes Related visit to physician within the last 2 weeks? ---No Does the PT have any chronic conditions? (i.e. diabetes, asthma, etc.) ---No Did the patient indicate they were pregnant? ---No Guidelines Guideline Title Affirmed Question Affirmed Notes Nurse Date/Time [name] Time) Abdominal Pain - Female [1] SEVERE pain (e.g., excruciating) AND [2] present > 1 hour Mallie Mussel, RN, Alveta Heimlich 11/28/2014 8:23:44 AM Disp. Time [name] Time) Disposition Final User 11/28/2014 8:24:29 AM Go to ED Now Yes Mallie Mussel, RN, Ola Spurr Understands: Yes PLEASE NOTE: All timestamps contained within this report are represented as [place] Standard Time. CONFIDENTIALTY NOTICE: This fax transmission is intended only for the addressee. It contains information that is legally privileged, confidential or otherwise protected from use or disclosure. If you are not the intended recipient, you are strictly prohibited from reviewing, disclosing, copying using or disseminating any of this information or taking any action in reliance on or regarding this information. If you have received this fax in error, please notify us immediately by telephone so that we can arrange for its return to [place]. Phone: [phone], Toll-Free: [phone], Fax: [phone] Page: 2 of 2 Call Id: [id number] Disagree/Comply: Comply Care Advice Given Per Guideline GO TO ED NOW: You need to be seen in the Emergency Department. Go to the ER at ___________ [place] now. Drive carefully. DRIVING: Another adult should drive. * Please bring a list of your current medicines when you go to the Emergency Department (ER). NOTHING BY MOUTH: Do not eat or drink anything for now. (Reason: condition may need surgery and general anesthesia) After Care Instructions Given Call Event Type User Date / Time Description Referrals [facility]

## 2014-11-28 NOTE — ED Notes (Signed)
Pt vomiting, abdominal pain and diarrhea since 2 am last night.  States recently started trazadone and feels it is related.

## 2014-11-28 NOTE — ED Provider Notes (Signed)
CSN: [id number]     Arrival date & time 11/28/14  0900 History   First MD Initiated Contact with Patient 11/28/14 [phone]     No chief complaint on file.  (Consider location/radiation/quality/duration/timing/severity/associated sxs/prior Treatment) HPI Comments: Patient presents with c/o nausea, vomiting, diarrhea, and abdominal pain which began early this morning after taking trazodone. Abdominal pain is generalized and does not radiate. Patient had a nosebleed earlier from her right nare. No urinary symptoms. No treatments PTA. Patient states that she had similar episode after taking trazodone at night before. No history of abdominal surgeries. Onset of symptoms acute. Course is constant.  The history is provided by the patient.    Past Medical History  Diagnosis Date  . Arthritis   . Depression   . Headache(784.0)   . HSV-2 infection   . Thrombocytopenia 10/01/2012  . Immune deficiency disorder   . Stroke    Past Surgical History  Procedure Laterality Date  . Partial hysterectomy  2012    due to fibroid  . Wisdom tooth extraction  1995  . Cesarean section  2008    x1  . Abdominal hysterectomy     Family History  Problem Relation Age of Onset  . Diabetes Father   . Cancer Paternal Aunt     breast  . Cancer Paternal Grandmother     colon CA   History  Substance Use Topics  . Smoking status: Never Smoker   . Smokeless tobacco: Never Used     Comment: Pt states she smokes.....but "not cigarettes"  . Alcohol Use: 1.2 oz/week    2 Glasses of wine per week     Comment: occ   OB History    Gravida Para Term Preterm AB TAB SAB Ectopic Multiple Living   3         3      Obstetric Comments   Pt states that she has had a lot of miscarriages. Do not know how many     Review of Systems  Constitutional: Negative for fever.  HENT: Negative for rhinorrhea and sore throat.   Eyes: Negative for redness.  Respiratory: Negative for cough.   Cardiovascular: Negative for chest  pain.  Gastrointestinal: Positive for nausea, vomiting, abdominal pain and diarrhea.  Genitourinary: Negative for dysuria.  Musculoskeletal: Negative for myalgias.  Skin: Negative for rash.  Neurological: Negative for headaches.    Allergies  Amoxicillin and Carafate  Home Medications   Prior to Admission medications   Medication Sig Start Date End Date Taking? Authorizing Provider  ALPRAZolam Duanne Moron) 1 MG tablet Take 0.5 mg by mouth 2 (two) times daily as needed for anxiety.     Historical Provider, MD  cyclobenzaprine (FLEXERIL) 10 MG tablet Take 10 mg by mouth daily as needed for muscle spasms.    Historical Provider, MD  etonogestrel (IMPLANON) 68 MG IMPL implant Inject 1 each into the skin continuous.    Historical Provider, MD  fluconazole (DIFLUCAN) 150 MG tablet Take 1 tablet (150 mg total) by mouth once. 11/19/14   Abner Greenspan, MD  fluticasone (FLONASE) 50 MCG/ACT nasal spray Place 2 sprays into both nostrils daily. 04/07/14   Abner Greenspan, MD  HYDROcodone-acetaminophen (NORCO/VICODIN) 5-325 MG per tablet Take 1 tablet by mouth every 6 (six) hours as needed for moderate pain. 08/13/14   Resa Miner Lawyer, PA-C  ibuprofen (ADVIL,MOTRIN) 800 MG tablet Take 1 tablet (800 mg total) by mouth every 8 (eight) hours as needed. 08/13/14   Harrell Gave  W Lawyer, PA-C  PARoxetine (PAXIL) 40 MG tablet Take 1 tablet (40 mg total) by mouth daily. 05/26/14   Abner Greenspan, MD  tetrahydrozoline 0.05 % ophthalmic solution Place 1 drop into both eyes daily as needed (for dry eyes).    Historical Provider, MD  valACYclovir (VALTREX) 500 MG tablet Take 1 tablet (500 mg total) by mouth daily. 08/20/14   Marne A [name], MD   BP 168/89 mmHg  Temp(Src) 97.9 F (36.6 C) (Oral)  Resp 16   Physical Exam  Constitutional: She appears well-developed and well-nourished.  HENT:  Head: Normocephalic and atraumatic.  Eyes: Conjunctivae are normal. Right eye exhibits no discharge. Left eye exhibits no  discharge.  Neck: Normal range of motion. Neck supple.  Cardiovascular: Normal rate, regular rhythm and normal heart sounds.   Pulmonary/Chest: Effort normal and breath sounds normal.  Abdominal: Soft. Bowel sounds are normal. She exhibits no distension. There is tenderness. There is no rebound and no guarding.  Mild generalized tenderness.  Neurological: She is alert.  Skin: Skin is warm and dry.  Psychiatric: She has a normal mood and affect.  Nursing note and vitals reviewed.   ED Course  Procedures (including critical care time) Labs Review Labs Reviewed  CBC WITH DIFFERENTIAL/PLATELET - Abnormal; Notable for the following:    Neutrophils Relative % 81 (*)    All other components within normal limits  COMPREHENSIVE METABOLIC PANEL - Abnormal; Notable for the following:    Potassium 3.1 (*)    Glucose, Bld 157 (*)    GFR calc non Af Amer 63 (*)    GFR calc Af Amer 73 (*)    All other components within normal limits  URINALYSIS, ROUTINE W REFLEX MICROSCOPIC - Abnormal; Notable for the following:    APPearance CLOUDY (*)    Glucose, UA 100 (*)    Hgb urine dipstick MODERATE (*)    Ketones, ur 40 (*)    Protein, ur 30 (*)    All other components within normal limits  LIPASE, BLOOD  URINE MICROSCOPIC-ADD ON    Imaging Review No results found.   EKG Interpretation   Date/Time:  Friday November 28 2014 09:34:24 EST Ventricular Rate:  70 PR Interval:  174 QRS Duration: 84 QT Interval:  442 QTC Calculation: 477 R Axis:   56 Text Interpretation:  Sinus rhythm No significant change since last  tracing Confirmed by WARD,  DO, KRISTEN ([id number]) on 11/28/2014 9:39:26 AM       10:03 AM Patient seen and examined. Work-up initiated. Medications ordered.   Vital signs reviewed and are as follows: BP 168/89 mmHg  Temp(Src) 97.9 F (36.6 C) (Oral)  Resp 16  12:55 PM Patient is doing much better. Her pain is improved. She is no longer nauseous after receiving IV Phenergan.  Abdomen is soft and nontender. She is not vomiting.  Patient requesting discharge to home. Reviewed labs which are reassuring. Patient informed of slightly low potassium.  MDM   Final diagnoses:  Nausea vomiting and diarrhea  Generalized abdominal pain   Patient with symptoms consistent with viral gastroenteritis. Also possible intolerance to Trazadone given temporal relation of symptoms to taking this medication. Vitals are stable, no fever. No signs of dehydration, tolerating PO's. Lungs are clear. No focal abdominal pain, no concern for appendicitis, cholecystitis, pancreatitis, ruptured viscus, UTI, kidney stone, or any other abdominal etiology. Supportive therapy indicated with return if symptoms worsen. Patient counseled.     Carlisle Cater, PA-C 11/28/14 1300  [place], DO 11/28/14 1637

## 2014-11-29 ENCOUNTER — Emergency Department (HOSPITAL_COMMUNITY)
Admission: EM | Admit: 2014-11-29 | Discharge: 2014-11-29 | Disposition: A | Discharge status: Home / Self Care | Payer: Self-pay | Attending: Emergency Medicine | Admitting: Emergency Medicine

## 2014-11-29 ENCOUNTER — Emergency Department (HOSPITAL_COMMUNITY): Payer: No Typology Code available for payment source

## 2014-11-29 ENCOUNTER — Encounter (HOSPITAL_COMMUNITY): Payer: Self-pay | Admitting: Emergency Medicine

## 2014-11-29 DIAGNOSIS — Z8673 Personal history of transient ischemic attack (TIA), and cerebral infarction without residual deficits: Secondary | ICD-10-CM | POA: Insufficient documentation

## 2014-11-29 DIAGNOSIS — Z8619 Personal history of other infectious and parasitic diseases: Secondary | ICD-10-CM | POA: Insufficient documentation

## 2014-11-29 DIAGNOSIS — Z8639 Personal history of other endocrine, nutritional and metabolic disease: Secondary | ICD-10-CM | POA: Insufficient documentation

## 2014-11-29 DIAGNOSIS — M1990 Unspecified osteoarthritis, unspecified site: Secondary | ICD-10-CM | POA: Insufficient documentation

## 2014-11-29 DIAGNOSIS — R1110 Vomiting, unspecified: Secondary | ICD-10-CM | POA: Insufficient documentation

## 2014-11-29 DIAGNOSIS — Z880 Allergy status to penicillin: Secondary | ICD-10-CM | POA: Insufficient documentation

## 2014-11-29 DIAGNOSIS — Z90710 Acquired absence of both cervix and uterus: Secondary | ICD-10-CM | POA: Insufficient documentation

## 2014-11-29 DIAGNOSIS — F329 Major depressive disorder, single episode, unspecified: Secondary | ICD-10-CM | POA: Insufficient documentation

## 2014-11-29 DIAGNOSIS — R197 Diarrhea, unspecified: Secondary | ICD-10-CM

## 2014-11-29 DIAGNOSIS — Z79899 Other long term (current) drug therapy: Secondary | ICD-10-CM | POA: Insufficient documentation

## 2014-11-29 DIAGNOSIS — Z9889 Other specified postprocedural states: Secondary | ICD-10-CM | POA: Insufficient documentation

## 2014-11-29 DIAGNOSIS — R1084 Generalized abdominal pain: Secondary | ICD-10-CM | POA: Insufficient documentation

## 2014-11-29 DIAGNOSIS — F419 Anxiety disorder, unspecified: Secondary | ICD-10-CM | POA: Insufficient documentation

## 2014-11-29 DIAGNOSIS — Z862 Personal history of diseases of the blood and blood-forming organs and certain disorders involving the immune mechanism: Secondary | ICD-10-CM | POA: Insufficient documentation

## 2014-11-29 DIAGNOSIS — Z7951 Long term (current) use of inhaled steroids: Secondary | ICD-10-CM | POA: Insufficient documentation

## 2014-11-29 DIAGNOSIS — R109 Unspecified abdominal pain: Secondary | ICD-10-CM

## 2014-11-29 LAB — URINALYSIS, ROUTINE W REFLEX MICROSCOPIC
Bilirubin Urine: NEGATIVE
Glucose, UA: NEGATIVE mg/dL
Ketones, ur: >80 mg/dL — AB
Leukocytes, UA: NEGATIVE
Nitrite: NEGATIVE
PH: 6 (ref 5.0–8.0)
Protein, ur: 100 mg/dL — AB
SPECIFIC GRAVITY, URINE: 1.024 (ref 1.005–1.030)
Urobilinogen, UA: 0.2 mg/dL (ref 0.0–1.0)

## 2014-11-29 LAB — CBC WITH DIFFERENTIAL/PLATELET
Basophils Absolute: 0 10*3/uL (ref 0.0–0.1)
Basophils Relative: 0 % (ref 0–1)
EOS ABS: 0 10*3/uL (ref 0.0–0.7)
EOS PCT: 0 % (ref 0–5)
HCT: 44.4 % (ref 36.0–46.0)
Hemoglobin: 15.7 g/dL — ABNORMAL HIGH (ref 12.0–15.0)
LYMPHS ABS: 0.9 10*3/uL (ref 0.7–4.0)
LYMPHS PCT: 10 % — AB (ref 12–46)
MCH: 32.8 pg (ref 26.0–34.0)
MCHC: 35.4 g/dL (ref 30.0–36.0)
MCV: 92.7 fL (ref 78.0–100.0)
MONO ABS: 0.2 10*3/uL (ref 0.1–1.0)
MONOS PCT: 2 % — AB (ref 3–12)
NEUTROS PCT: 88 % — AB (ref 43–77)
Neutro Abs: 7.5 10*3/uL (ref 1.7–7.7)
PLATELETS: 146 10*3/uL — AB (ref 150–400)
RBC: 4.79 MIL/uL (ref 3.87–5.11)
RDW: 13.5 % (ref 11.5–15.5)
WBC: 8.6 10*3/uL (ref 4.0–10.5)

## 2014-11-29 LAB — COMPREHENSIVE METABOLIC PANEL
ALT: 23 U/L (ref 0–35)
AST: 32 U/L (ref 0–37)
Albumin: 4.9 g/dL (ref 3.5–5.2)
Alkaline Phosphatase: 53 U/L (ref 39–117)
Anion gap: 11 (ref 5–15)
BUN: 10 mg/dL (ref 6–23)
CALCIUM: 10 mg/dL (ref 8.4–10.5)
CO2: 23 mmol/L (ref 19–32)
Chloride: 106 mmol/L (ref 96–112)
Creatinine, Ser: 1.01 mg/dL (ref 0.50–1.10)
GFR calc Af Amer: 77 mL/min — ABNORMAL LOW (ref >90)
GFR calc non Af Amer: 67 mL/min — ABNORMAL LOW (ref >90)
Glucose, Bld: 135 mg/dL — ABNORMAL HIGH (ref 70–99)
Potassium: 3.5 mmol/L (ref 3.5–5.1)
SODIUM: 140 mmol/L (ref 135–145)
TOTAL PROTEIN: 9 g/dL — AB (ref 6.0–8.3)
Total Bilirubin: 0.9 mg/dL (ref 0.3–1.2)

## 2014-11-29 LAB — URINE MICROSCOPIC-ADD ON

## 2014-11-29 LAB — LIPASE, BLOOD: Lipase: 21 U/L (ref 11–59)

## 2014-11-29 MED ORDER — HYOSCYAMINE SULFATE 0.125 MG PO TABS
0.1250 mg | ORAL_TABLET | Freq: Once | ORAL | Status: DC
  Filled 2014-11-29: qty 1

## 2014-11-29 MED ORDER — HYOSCYAMINE SULFATE 0.125 MG PO TBDP
0.1250 mg | ORAL_TABLET | Freq: Once | ORAL | Status: AC
  Administered 2014-11-29: 0.125 mg via SUBLINGUAL
  Filled 2014-11-29: qty 1

## 2014-11-29 MED ORDER — SODIUM CHLORIDE 0.9 % IV BOLUS (SEPSIS)
1000.0000 mL | Freq: Once | INTRAVENOUS | Status: AC
  Administered 2014-11-29: 1000 mL via INTRAVENOUS

## 2014-11-29 MED ORDER — IOHEXOL 300 MG/ML  SOLN
100.0000 mL | Freq: Once | INTRAMUSCULAR | Status: AC | PRN
  Administered 2014-11-29: 100 mL via INTRAVENOUS

## 2014-11-29 MED ORDER — OMEPRAZOLE 40 MG PO CPDR: 40.0000 mg | DELAYED_RELEASE_CAPSULE | Freq: Every day | ORAL | Status: DC

## 2014-11-29 MED ORDER — PROMETHAZINE HCL 25 MG/ML IJ SOLN
25.0000 mg | Freq: Once | INTRAMUSCULAR | Status: AC
  Administered 2014-11-29: 25 mg via INTRAMUSCULAR
  Filled 2014-11-29: qty 1

## 2014-11-29 MED ORDER — IOHEXOL 300 MG/ML  SOLN: 25.0000 mL | Freq: Once | INTRAMUSCULAR | Status: DC | PRN

## 2014-11-29 MED ORDER — HYDROCODONE-ACETAMINOPHEN 5-325 MG PO TABS: 1.0000 | ORAL_TABLET | Freq: Four times a day (QID) | ORAL | Status: DC | PRN

## 2014-11-29 MED ORDER — HYDROMORPHONE HCL 1 MG/ML IJ SOLN
1.0000 mg | Freq: Once | INTRAMUSCULAR | Status: AC
  Administered 2014-11-29: 1 mg via INTRAVENOUS
  Filled 2014-11-29: qty 1

## 2014-11-29 NOTE — ED Notes (Signed)
Pt given 8 oz of gingerale

## 2014-11-29 NOTE — ED Provider Notes (Signed)
CSN: [id number]     Arrival date & time 11/29/14  1038 History   First MD Initiated Contact with Patient 11/29/14 1046     Chief Complaint  Patient presents with  . Abdominal Pain  . Emesis    Patient is a 44 y.o. female presenting with abdominal pain and vomiting. The history is provided by the patient. No language interpreter was used.  Abdominal Pain Associated symptoms: vomiting   Emesis Associated symptoms: abdominal pain    [name] presents for evaluation of vomiting, abdominal pain, diarrhea. Her symptoms started yesterday. They started after she took her first dose of trazodone. She reports lower abdominal pain that is U-shaped sugar associated with multiple episodes of emesis. She had some bloody emesis yesterday but none today. She reports multiple episodes of diarrhea that are nonbloody. She denies any dysuria or vaginal discharge. She denies any fevers. She was seen in the emergency department yesterday and felt better after Phenergan, but her vomiting returned early this morning. Symptoms are moderate, constant, worsening.  Past Medical History  Diagnosis Date  . Arthritis   . Depression   . Headache(784.0)   . HSV-2 infection   . Thrombocytopenia 10/01/2012  . Immune deficiency disorder   . Stroke    Past Surgical History  Procedure Laterality Date  . Partial hysterectomy  2012    due to fibroid  . Wisdom tooth extraction  1995  . Cesarean section  2008    x1  . Abdominal hysterectomy     Family History  Problem Relation Age of Onset  . Diabetes Father   . Cancer Paternal Aunt     breast  . Cancer Paternal Grandmother     colon CA   History  Substance Use Topics  . Smoking status: Never Smoker   . Smokeless tobacco: Never Used     Comment: Pt states she smokes.....but "not cigarettes"  . Alcohol Use: 1.2 oz/week    2 Glasses of wine per week     Comment: occ   OB History    Gravida Para Term Preterm AB TAB SAB Ectopic Multiple Living   3         3       Obstetric Comments   Pt states that she has had a lot of miscarriages. Do not know how many     Review of Systems  Gastrointestinal: Positive for vomiting and abdominal pain.  All other systems reviewed and are negative.     Allergies  Amoxicillin and Carafate  Home Medications   Prior to Admission medications   Medication Sig Start Date End Date Taking? Authorizing Provider  ALPRAZolam Duanne Moron) 1 MG tablet Take 0.5 mg by mouth 2 (two) times daily as needed for anxiety.     Historical Provider, MD  cyclobenzaprine (FLEXERIL) 10 MG tablet Take 10 mg by mouth daily as needed for muscle spasms.    Historical Provider, MD  etonogestrel (IMPLANON) 68 MG IMPL implant Inject 1 each into the skin continuous.    Historical Provider, MD  fluconazole (DIFLUCAN) 150 MG tablet Take 1 tablet (150 mg total) by mouth once. 11/19/14   Abner Greenspan, MD  fluticasone (FLONASE) 50 MCG/ACT nasal spray Place 2 sprays into both nostrils daily. 04/07/14   Abner Greenspan, MD  HYDROcodone-acetaminophen (NORCO/VICODIN) 5-325 MG per tablet Take 1 tablet by mouth every 6 (six) hours as needed for moderate pain. 08/13/14   Resa Miner Lawyer, PA-C  ibuprofen (ADVIL,MOTRIN) 800 MG tablet Take  1 tablet (800 mg total) by mouth every 8 (eight) hours as needed. 08/13/14   Resa Miner Lawyer, PA-C  PARoxetine (PAXIL) 40 MG tablet Take 1 tablet (40 mg total) by mouth daily. 05/26/14   Abner Greenspan, MD  promethazine (PHENERGAN) 25 MG tablet Take 1 tablet (25 mg total) by mouth every 6 (six) hours as needed for nausea or vomiting. 11/28/14   Carlisle Cater, PA-C  tetrahydrozoline 0.05 % ophthalmic solution Place 1 drop into both eyes daily as needed (for dry eyes).    Historical Provider, MD  valACYclovir (VALTREX) 500 MG tablet Take 1 tablet (500 mg total) by mouth daily. 08/20/14   Marne A [name], MD   BP 193/110 mmHg  Pulse 66  Temp(Src) 98.5 F (36.9 C) (Oral)  Resp 20  SpO2 100% Physical Exam  Constitutional:  She is oriented to person, place, and time. She appears well-developed and well-nourished.  Mild distress  HENT:  Head: Normocephalic and atraumatic.  Cardiovascular: Normal rate and regular rhythm.   No murmur heard. Pulmonary/Chest: Effort normal and breath sounds normal. No respiratory distress.  Abdominal: Soft. There is no rebound and no guarding.  Mild diffuse abdominal tenderness, greatest over the right lower abdomen.  Musculoskeletal: She exhibits no edema or tenderness.  Neurological: She is alert and oriented to person, place, and time.  Skin: Skin is warm and dry.  Psychiatric:  Anxious  Nursing note and vitals reviewed.   ED Course  Procedures (including critical care time) Labs Review Labs Reviewed  COMPREHENSIVE METABOLIC PANEL - Abnormal; Notable for the following:    Glucose, Bld 135 (*)    Total Protein 9.0 (*)    GFR calc non Af Amer 67 (*)    GFR calc Af Amer 77 (*)    All other components within normal limits  CBC WITH DIFFERENTIAL/PLATELET - Abnormal; Notable for the following:    Hemoglobin 15.7 (*)    Platelets 146 (*)    Neutrophils Relative % 88 (*)    Lymphocytes Relative 10 (*)    Monocytes Relative 2 (*)    All other components within normal limits  URINALYSIS, ROUTINE W REFLEX MICROSCOPIC - Abnormal; Notable for the following:    Hgb urine dipstick LARGE (*)    Ketones, ur >80 (*)    Protein, ur 100 (*)    All other components within normal limits  URINE MICROSCOPIC-ADD ON - Abnormal; Notable for the following:    Bacteria, UA FEW (*)    All other components within normal limits  URINE CULTURE  LIPASE, BLOOD    Imaging Review Ct Abdomen Pelvis W Contrast  11/29/2014   CLINICAL DATA:  Lower abdominal pain with nausea, vomiting, and diarrhea  EXAM: CT ABDOMEN AND PELVIS WITH CONTRAST  TECHNIQUE: Multidetector CT imaging of the abdomen and pelvis was performed using the standard protocol following bolus administration of intravenous contrast.  Oral contrast was also administered.  CONTRAST:  15mL OMNIPAQUE IOHEXOL 300 MG/ML  SOLN  COMPARISON:  June 08, 2012  FINDINGS: Lung bases are clear.  No focal liver lesions are identified. Gallbladder wall is not thickened. There is no biliary duct dilatation.  Spleen, pancreas, and adrenals appear normal. Kidneys bilaterally show no mass or hydronephrosis on either side. There is no renal or ureteral calculus on either side.  In the pelvis, the urinary bladder is midline with normal wall thickness. There is no pelvic mass or pelvic fluid collection. There are a few scattered sigmoid diverticula without diverticulitis.  Appendix appears normal.  There is a a rather minimal ventral hernia containing only fat.  There is no appreciable bowel obstruction. No free air or portal venous air. There is no ascites, adenopathy, or abscess in the abdomen or pelvis. There is some questionable thickening of the wall of the distal gastric antrum and pylorus.  Minimal air in the fatty tissues of the right buttocks area suggests recent injection.  No demonstrable abdominal aortic aneurysm.  There is lower thoracic dextroscoliosis with upper lumbar levoscoliosis. There is a sclerotic area in the posterior left seventh rib, likely a bone island. No other sclerotic type of foci in the visualized bony structures. No lytic lesions. 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  IMPRESSION: No bowel obstruction.  No abscess.  Appendix appears normal.  No renal or ureteral calculus.  No hydronephrosis.  Mild osteitis condensans ilia on the left, a finding of questionable clinical significance. Probable bone island in the left posterior seventh rib. Scoliosis noted.  Rather minimal ventral hernia containing fat.  Small focus of air in the fatty tissues of the right buttocks suggests recent injection.   Electronically Signed   By: Lowella Grip M.D.   On: 11/29/2014 16:14     EKG Interpretation None      MDM   Final diagnoses:   Abdominal pain, vomiting, and diarrhea    1255 on repeat exam pt reports feeling much improved with nausea and abdominal pain resolved.  Abdomen is soft and nontender on recheck.  Will try po fluid trial and recheck.    Recheck at 1400 - pt tolerating po, feeling improved but does have RLQ tenderness on recheck.  Checking CT abd to rule out appendicitis.    Quintella Reichert, MD 11/29/14 [phone]

## 2014-11-29 NOTE — ED Notes (Signed)
Pt tolerated her Gingerale well.

## 2014-11-29 NOTE — ED Notes (Signed)
NAD at this time. Pt is stable and leaving with her mother.

## 2014-11-29 NOTE — Discharge Instructions (Signed)
Abdominal Pain, Women °Abdominal (stomach, pelvic, or belly) pain can be caused by many things. It is important to tell your doctor: °· The location of the pain. °· Does it come and go or is it present all the time? °· Are there things that start the pain (eating certain foods, exercise)? °· Are there other symptoms associated with the pain (fever, nausea, vomiting, diarrhea)? °All of this is helpful to know when trying to find the cause of the pain. °CAUSES  °· Stomach: virus or bacteria infection, or ulcer. °· Intestine: appendicitis (inflamed appendix), regional ileitis (Crohn's disease), ulcerative colitis (inflamed colon), irritable bowel syndrome, diverticulitis (inflamed diverticulum of the colon), or cancer of the stomach or intestine. °· Gallbladder disease or stones in the gallbladder. °· Kidney disease, kidney stones, or infection. °· Pancreas infection or cancer. °· Fibromyalgia (pain disorder). °· Diseases of the female organs: °¨ Uterus: fibroid (non-cancerous) tumors or infection. °¨ Fallopian tubes: infection or tubal pregnancy. °¨ Ovary: cysts or tumors. °¨ Pelvic adhesions (scar tissue). °¨ Endometriosis (uterus lining tissue growing in the pelvis and on the pelvic organs). °¨ Pelvic congestion syndrome (female organs filling up with blood just before the menstrual period). °¨ Pain with the menstrual period. °¨ Pain with ovulation (producing an egg). °¨ Pain with an IUD (intrauterine device, birth control) in the uterus. °¨ Cancer of the female organs. °· Functional pain (pain not caused by a disease, may improve without treatment). °· Psychological pain. °· Depression. °DIAGNOSIS  °Your doctor will decide the seriousness of your pain by doing an examination. °· Blood tests. °· X-rays. °· Ultrasound. °· CT scan (computed tomography, special type of X-ray). °· MRI (magnetic resonance imaging). °· Cultures, for infection. °· Barium enema (dye inserted in the large intestine, to better view it with  X-rays). °· Colonoscopy (looking in intestine with a lighted tube). °· Laparoscopy (minor surgery, looking in abdomen with a lighted tube). °· Major abdominal exploratory surgery (looking in abdomen with a large incision). °TREATMENT  °The treatment will depend on the cause of the pain.  °· Many cases can be observed and treated at home. °· Over-the-counter medicines recommended by your caregiver. °· Prescription medicine. °· Antibiotics, for infection. °· Birth control pills, for painful periods or for ovulation pain. °· Hormone treatment, for endometriosis. °· Nerve blocking injections. °· Physical therapy. °· Antidepressants. °· Counseling with a psychologist or psychiatrist. °· Minor or major surgery. °HOME CARE INSTRUCTIONS  °· Do not take laxatives, unless directed by your caregiver. °· Take over-the-counter pain medicine only if ordered by your caregiver. Do not take aspirin because it can cause an upset stomach or bleeding. °· Try a clear liquid diet (broth or water) as ordered by your caregiver. Slowly move to a bland diet, as tolerated, if the pain is related to the stomach or intestine. °· Have a thermometer and take your temperature several times a day, and record it. °· Bed rest and sleep, if it helps the pain. °· Avoid sexual intercourse, if it causes pain. °· Avoid stressful situations. °· Keep your follow-up appointments and tests, as your caregiver orders. °· If the pain does not go away with medicine or surgery, you may try: °¨ Acupuncture. °¨ Relaxation exercises (yoga, meditation). °¨ Group therapy. °¨ Counseling. °SEEK MEDICAL CARE IF:  °· You notice certain foods cause stomach pain. °· Your home care treatment is not helping your pain. °· You need stronger pain medicine. °· You want your IUD removed. °· You feel faint or   lightheaded. °· You develop nausea and vomiting. °· You develop a rash. °· You are having side effects or an allergy to your medicine. °SEEK IMMEDIATE MEDICAL CARE IF:  °· Your  pain does not go away or gets worse. °· You have a fever. °· Your pain is felt only in portions of the abdomen. The right side could possibly be appendicitis. The left lower portion of the abdomen could be colitis or diverticulitis. °· You are passing blood in your stools (bright red or black tarry stools, with or without vomiting). °· You have blood in your urine. °· You develop chills, with or without a fever. °· You pass out. °MAKE SURE YOU:  °· Understand these instructions. °· Will watch your condition. °· Will get help right away if you are not doing well or get worse. °Document Released: 08/14/2007 Document Revised: 03/03/2014 Document Reviewed: 09/03/2009 °[employer]® Patient Information ©2015 [employer]. This information is not intended to replace advice given to you by your health care provider. Make sure you discuss any questions you have with your health care provider. ° °

## 2014-11-29 NOTE — ED Notes (Signed)
Ct  Notified pt was finished drinking contrast.

## 2014-11-29 NOTE — ED Notes (Signed)
Pt left for CT.

## 2014-11-29 NOTE — ED Notes (Addendum)
Pt reports that she was seen here for lower abd pain and N/V/D. Pt reports that the prescribed medicine is not working. NAD at this time. Pt alert x4. Pt is requesting reevaluation do to increased pain.

## 2014-12-01 LAB — URINE CULTURE: Colony Count: 25000

## 2014-12-02 NOTE — Telephone Encounter (Signed)
Spoken to patient. She stated that she is better but still feel weak. Trying to drink fluids but can barely swallow.

## 2014-12-02 NOTE — Telephone Encounter (Signed)
Patient said she received several calls this morning.  Patient said if you need to reach her, she's at her mother's house. Phone [phone].

## 2014-12-02 NOTE — Telephone Encounter (Signed)
Just wanted to check in and see how she is feeling

## 2014-12-03 NOTE — Telephone Encounter (Signed)
Spoken to patient and she stated that her throat is dry but much better than before. Patient stated will call for f/u if worsen

## 2014-12-03 NOTE — Telephone Encounter (Signed)
Thanks for the update - f/u if no further improvement or if throat worsens

## 2014-12-22 ENCOUNTER — Ambulatory Visit (INDEPENDENT_AMBULATORY_CARE_PROVIDER_SITE_OTHER): Payer: No Typology Code available for payment source | Admitting: Family Medicine

## 2014-12-22 ENCOUNTER — Telehealth: Payer: Self-pay

## 2014-12-22 ENCOUNTER — Encounter: Payer: Self-pay | Admitting: Family Medicine

## 2014-12-22 ENCOUNTER — Ambulatory Visit (INDEPENDENT_AMBULATORY_CARE_PROVIDER_SITE_OTHER)
Admission: RE | Admit: 2014-12-22 | Discharge: 2014-12-22 | Disposition: A | Discharge status: Home / Self Care | Payer: No Typology Code available for payment source | Source: Ambulatory Visit | Attending: Family Medicine | Admitting: Family Medicine

## 2014-12-22 VITALS — BP 138/92 | HR 77 | Temp 98.1°F | Ht 62.5 in | Wt 208.1 lb

## 2014-12-22 DIAGNOSIS — M109 Gout, unspecified: Secondary | ICD-10-CM

## 2014-12-22 DIAGNOSIS — M10072 Idiopathic gout, left ankle and foot: Secondary | ICD-10-CM

## 2014-12-22 LAB — URIC ACID: URIC ACID, SERUM: 4.6 mg/dL (ref 2.4–7.0)

## 2014-12-22 MED ORDER — INDOMETHACIN 50 MG PO CAPS: 50.0000 mg | ORAL_CAPSULE | Freq: Three times a day (TID) | ORAL | Status: DC

## 2014-12-22 MED ORDER — COLCHICINE 0.6 MG PO TABS: ORAL_TABLET | ORAL | Status: DC

## 2014-12-22 NOTE — Telephone Encounter (Signed)
Pt left v//m; pt was seen earlier and colchicine cost to pt was $213.00. Pt request less expensive med.[facility] out pt pharmacy. Pt request cb.

## 2014-12-22 NOTE — Progress Notes (Signed)
Pre visit review using our clinic review tool, if applicable. No additional management support is needed unless otherwise documented below in the visit note. 

## 2014-12-22 NOTE — Telephone Encounter (Signed)
Spoken to patient. Notified her of Dr [name]'s comments. Patient verbalized understanding.  

## 2014-12-22 NOTE — Progress Notes (Signed)
Subjective:    Patient ID: Dana Greer, female    DOB: 03-30-1971, 44 y.o.   MRN: [id number]  HPI Here with L foot pain   In the beginning it was sore - over medial foot (? Thought it was a bunion) Got worse - hurts to the touch Pain rad all the way up to ankle Hurts to even let the covers touch it in bed Soaked it in hot and cold and epsom salt   (worse in hot water)  Used aspercreme and tiger balm   Cannot sleep due to the pain    ? Gout  Had trouble with R hand a while back   Ate a rare steak at [facility]  Also went to the hospital (ED)- with abd pain and vomiting - still trying to get caught up on fluids  Will have insurance in April   She tried a meloxicam dose , and not taking advil   Patient Active Problem List   Diagnosis Date Noted  . Visit for routine gyn exam 08/04/2014  . Visit for screening mammogram 08/04/2014  . Pain, joint, multiple sites 05/26/2014  . Viral URI with cough 04/07/2014  . ETD (eustachian tube dysfunction) 04/07/2014  . Vertiginous migraine 02/03/2014  . Right hand pain 09/24/2013  . Coccyx pain 03/27/2013  . Sacroiliac dysfunction 03/27/2013  . Thrombocytopenia 10/01/2012  . Intractable migraine 08/31/2012  . Osteoarthritis 05/11/2012  . Female bladder prolapse 05/11/2012  . Hypokalemia 05/11/2012  . Allergic conjunctivitis 08/11/2011  . ADJ DISORDER WITH MIXED ANXIETY & DEPRESSED MOOD 06/30/2008  . HYPERHIDROSIS 01/25/2008  . BACK PAIN, LUMBAR, CHRONIC 10/16/2007  . REACTION, ACUTE STRESS W/EMOTIONAL DSTURB 08/01/2007  . HYPERGLYCEMIA 08/01/2007  . ELEVATED BLOOD PRESSURE WITHOUT DIAGNOSIS OF HYPERTENSION 08/01/2007  . GOITER NOS 07/11/2007  . POLYCYSTIC OVARIAN DISEASE 05/24/2007  . HIRSUTISM 05/24/2007  . ARTHROPATHY NOS, MULTIPLE SITES 05/24/2007   Past Medical History  Diagnosis Date  . Arthritis   . Depression   . Headache(784.0)   . HSV-2 infection   . Thrombocytopenia 10/01/2012  . Immune deficiency disorder   .  Stroke    Past Surgical History  Procedure Laterality Date  . Partial hysterectomy  2012    due to fibroid  . Wisdom tooth extraction  1995  . Cesarean section  2008    x1  . Abdominal hysterectomy     History  Substance Use Topics  . Smoking status: Never Smoker   . Smokeless tobacco: Never Used     Comment: Pt states she smokes.....but "not cigarettes"  . Alcohol Use: 1.2 oz/week    2 Glasses of wine per week     Comment: occ   Family History  Problem Relation Age of Onset  . Diabetes Father   . Cancer Paternal Aunt     breast  . Cancer Paternal Grandmother     colon CA   Allergies  Allergen Reactions  . Amoxicillin Other (See Comments)    Yeast infection   . Carafate [Sucralfate] Nausea And Vomiting   Current Outpatient Prescriptions on File Prior to Visit  Medication Sig Dispense Refill  . ALPRAZolam (XANAX) 1 MG tablet Take 0.5 mg by mouth 2 (two) times daily as needed for anxiety.     Marland Kitchen etonogestrel (IMPLANON) 68 MG IMPL implant Inject 1 each into the skin continuous.    . fluticasone (FLONASE) 50 MCG/ACT nasal spray Place 2 sprays into both nostrils daily. (Patient taking differently: Place 2 sprays into both nostrils  2 (two) times daily. ) 16 g 6  . omeprazole (PRILOSEC) 40 MG capsule Take 1 capsule (40 mg total) by mouth daily. 14 capsule 0  . tetrahydrozoline 0.05 % ophthalmic solution Place 1 drop into both eyes daily as needed (for dry eyes).    . valACYclovir (VALTREX) 500 MG tablet Take 1 tablet (500 mg total) by mouth daily. 30 tablet 3  . cyclobenzaprine (FLEXERIL) 10 MG tablet Take 10 mg by mouth daily as needed for muscle spasms.    Marland Kitchen ibuprofen (ADVIL,MOTRIN) 800 MG tablet Take 1 tablet (800 mg total) by mouth every 8 (eight) hours as needed. (Patient not taking: Reported on 12/22/2014) 21 tablet 0  . meloxicam (MOBIC) 15 MG tablet Take 15 mg by mouth daily as needed for pain.    Marland Kitchen PARoxetine (PAXIL) 40 MG tablet Take 1 tablet (40 mg total) by mouth  daily. (Patient not taking: Reported on 12/22/2014) 30 tablet 11   No current facility-administered medications on file prior to visit.       Review of Systems .Review of Systems  Constitutional: Negative for fever, appetite change, fatigue and unexpected weight change.  Eyes: Negative for pain and visual disturbance.  Respiratory: Negative for cough and shortness of breath.   Cardiovascular: Negative for cp or palpitations    Gastrointestinal: Negative for nausea, diarrhea and constipation.  Genitourinary: Negative for urgency and frequency.  Skin: Negative for pallor or rash   MSK pos for L foot pain with swelling  Neurological: Negative for weakness, light-headedness, numbness and headaches.  Hematological: Negative for adenopathy. Does not bruise/bleed easily.  Psychiatric/Behavioral: Negative for dysphoric mood. The patient is not nervous/anxious.         Objective:   Physical Exam  Constitutional: She appears well-developed and well-nourished. No distress.  obese and well appearing   HENT:  Head: Normocephalic and atraumatic.  Eyes: Conjunctivae and EOM are normal. Pupils are equal, round, and reactive to light. No scleral icterus.  Neck: Normal range of motion. Neck supple.  Cardiovascular: Normal rate and regular rhythm.   Pulmonary/Chest: Effort normal and breath sounds normal.  Musculoskeletal: She exhibits edema and tenderness.  L foot is injected/warm/tender over first MTP joint and spreading slt over dorsal foot Nl rom of joints No crepitus   Lymphadenopathy:    She has no cervical adenopathy.  Neurological: She is alert. She has normal reflexes. No cranial nerve deficit. She exhibits normal muscle tone. Coordination normal.  Skin: Skin is warm and dry. No rash noted. There is erythema. No pallor.  Psychiatric: She has a normal mood and affect.          Assessment & Plan:   Problem List Items Addressed This Visit      Other   Gout - Primary     Strongly suspect gout in L foot  Triggered by dehydration and also eating steak? Disc hydration Trial of colchicine with food (disc GI side eff)  Xray today -disc poss of joint damage Uric acid level today- prophylaxis in the future poss if recurrent       Relevant Orders   DG Foot Complete Left (Completed)   Uric acid (Completed)

## 2014-12-22 NOTE — Assessment & Plan Note (Signed)
Strongly suspect gout in L foot  Triggered by dehydration and also eating steak? Disc hydration Trial of colchicine with food (disc GI side eff)  Xray today -disc poss of joint damage Uric acid level today- prophylaxis in the future poss if recurrent

## 2014-12-22 NOTE — Patient Instructions (Signed)
I think you have gout in your foot Xray today Lab today for uric acid Try colchicine for gout pain - and take it with food  Drink lots of fluids - dehydration likely played a role

## 2014-12-22 NOTE — Telephone Encounter (Signed)
Let's try indocin instead  Tell her to take with food  I will send it

## 2014-12-23 MED ORDER — CYCLOBENZAPRINE HCL 10 MG PO TABS: ORAL_TABLET | ORAL | Status: DC

## 2014-12-23 NOTE — Addendum Note (Signed)
Addended by: Jacqualin Combes on: 12/23/2014 05:26 PM   Modules accepted: Orders

## 2014-12-23 NOTE — Telephone Encounter (Signed)
Called in the rx as instructed to [facility]. Notified patient as well.

## 2014-12-23 NOTE — Telephone Encounter (Addendum)
Pt left v/m; pt started indocin and today pt has a terrible h/a; pt has had migraines in the past and does not think indocin is causing h/a but pt request cb with what pt can take for h/a. [facility] out pt pharmacy.

## 2014-12-23 NOTE — Telephone Encounter (Signed)
The indocin should not cause headache - it should actually help  She has flexeril on her list from the past - if she tolerated that in the past 1/2-1 pill up to tid for ha is ok - but careful of sedation If needed please call in flexeril 10 mg 1/2-1 pill up to tid prn headache # 30 no refills

## 2015-01-07 ENCOUNTER — Encounter: Payer: Self-pay | Admitting: Family Medicine

## 2015-01-07 ENCOUNTER — Telehealth: Payer: Self-pay | Admitting: Family Medicine

## 2015-01-07 ENCOUNTER — Ambulatory Visit (INDEPENDENT_AMBULATORY_CARE_PROVIDER_SITE_OTHER): Payer: No Typology Code available for payment source | Admitting: Family Medicine

## 2015-01-07 VITALS — BP 130/88 | HR 66 | Temp 98.8°F | Ht 62.5 in | Wt 209.5 lb

## 2015-01-07 DIAGNOSIS — M25562 Pain in left knee: Secondary | ICD-10-CM

## 2015-01-07 DIAGNOSIS — M25561 Pain in right knee: Secondary | ICD-10-CM | POA: Insufficient documentation

## 2015-01-07 MED ORDER — MELOXICAM 15 MG PO TABS: 15.0000 mg | ORAL_TABLET | Freq: Every day | ORAL | Status: DC

## 2015-01-07 NOTE — Telephone Encounter (Signed)
Patient Name: Dana Greer  DOB: 05/17/1971    Initial Comment Caller states was given RX for gout 2 weeks ago, took it until the pain went away and now she is having severe pain in knee and hip, should she start taking the med she was given before for the pain   Nurse Assessment  Nurse: Mallie Mussel, RN, Alveta Heimlich Date/Time [name] Time): 01/07/2015 10:14:47 AM  Confirm and document reason for call. If symptomatic, describe symptoms. ---Caller states that she was given medication for gout about 2 weeks ago. She took it until her pain went away. She was prescribed Indomethacin 50mg  1 PO TID. Her previous episode of gout was in her foot. This time its her knee and her hip. She has Osteoarthritis in her knee and her hip. Wants to know if she should take the Indomethacin, Meloxicam or does she want to see her. I called the backline and spoke with Morey Hummingbird. Information given. I did a warm transfer to [place] for further assistance.  Has the patient traveled out of the country within the last 30 days? ---Not Applicable  Does the patient require triage? ---No     Guidelines    Guideline Title Affirmed Question Affirmed Notes       Final Disposition User

## 2015-01-07 NOTE — Telephone Encounter (Signed)
Pt has appt today at 6pm with Dr Glori Bickers.

## 2015-01-07 NOTE — Progress Notes (Signed)
Subjective:    Patient ID: Dana Greer, female    DOB: 11-Jul-1971, 44 y.o.   MRN: [id number]  HPI Hurting all over constantly   Had gout - in her foot  That got better   Lost her balance going down the stairs - 5 days ago  [place] on her butt That really worsened her low back pain /sciatic problem   Knees are really bothering her - has osteoarthritis - has had injections in the past  Rubs hemp oil  Also exercise  Has worked on weight loss (lost and gained some back) ? If she has tried glucosamine  Physical therapy was very painful/difficult for her   She has knee braces to use  Has access to an exercise bike    Hands hurt   Hips hurt - known arthritis (she thinks)   Has seen a rheumatologist in the past - did not find any autoimmune disease   Patient Active Problem List   Diagnosis Date Noted  . Gout 12/22/2014  . Visit for routine gyn exam 08/04/2014  . Visit for screening mammogram 08/04/2014  . Pain, joint, multiple sites 05/26/2014  . Viral URI with cough 04/07/2014  . ETD (eustachian tube dysfunction) 04/07/2014  . Vertiginous migraine 02/03/2014  . Right hand pain 09/24/2013  . Coccyx pain 03/27/2013  . Sacroiliac dysfunction 03/27/2013  . Thrombocytopenia 10/01/2012  . Intractable migraine 08/31/2012  . Osteoarthritis 05/11/2012  . Female bladder prolapse 05/11/2012  . Hypokalemia 05/11/2012  . Allergic conjunctivitis 08/11/2011  . ADJ DISORDER WITH MIXED ANXIETY & DEPRESSED MOOD 06/30/2008  . HYPERHIDROSIS 01/25/2008  . BACK PAIN, LUMBAR, CHRONIC 10/16/2007  . REACTION, ACUTE STRESS W/EMOTIONAL DSTURB 08/01/2007  . HYPERGLYCEMIA 08/01/2007  . ELEVATED BLOOD PRESSURE WITHOUT DIAGNOSIS OF HYPERTENSION 08/01/2007  . GOITER NOS 07/11/2007  . POLYCYSTIC OVARIAN DISEASE 05/24/2007  . HIRSUTISM 05/24/2007  . ARTHROPATHY NOS, MULTIPLE SITES 05/24/2007   Past Medical History  Diagnosis Date  . Arthritis   . Depression   . Headache(784.0)   . HSV-2  infection   . Thrombocytopenia 10/01/2012  . Immune deficiency disorder   . Stroke    Past Surgical History  Procedure Laterality Date  . Partial hysterectomy  2012    due to fibroid  . Wisdom tooth extraction  1995  . Cesarean section  2008    x1  . Abdominal hysterectomy     History  Substance Use Topics  . Smoking status: Never Smoker   . Smokeless tobacco: Never Used     Comment: Pt states she smokes.....but "not cigarettes"  . Alcohol Use: 1.2 oz/week    2 Glasses of wine per week     Comment: occ   Family History  Problem Relation Age of Onset  . Diabetes Father   . Cancer Paternal Aunt     breast  . Cancer Paternal Grandmother     colon CA   Allergies  Allergen Reactions  . Trazodone And Nefazodone Nausea And Vomiting  . Amoxicillin Other (See Comments)    Yeast infection   . Carafate [Sucralfate] Nausea And Vomiting   Current Outpatient Prescriptions on File Prior to Visit  Medication Sig Dispense Refill  . ALPRAZolam (XANAX) 1 MG tablet Take 0.5 mg by mouth 2 (two) times daily as needed for anxiety.     . cyclobenzaprine (FLEXERIL) 10 MG tablet Take 1/2 to1 pill up tid prn for headache 30 tablet 0  . etonogestrel (IMPLANON) 68 MG IMPL implant Inject 1 each  into the skin continuous.    . fluticasone (FLONASE) 50 MCG/ACT nasal spray Place 2 sprays into both nostrils daily. (Patient taking differently: Place 2 sprays into both nostrils 2 (two) times daily. ) 16 g 6  . indomethacin (INDOCIN) 50 MG capsule Take 1 capsule (50 mg total) by mouth 3 (three) times daily with meals. As needed for gout pain 30 capsule 1  . omeprazole (PRILOSEC) 40 MG capsule Take 1 capsule (40 mg total) by mouth daily. 14 capsule 0  . PARoxetine (PAXIL) 40 MG tablet Take 1 tablet (40 mg total) by mouth daily. 30 tablet 11  . tetrahydrozoline 0.05 % ophthalmic solution Place 1 drop into both eyes daily as needed (for dry eyes).    . valACYclovir (VALTREX) 500 MG tablet Take 1 tablet (500 mg  total) by mouth daily. 30 tablet 3   No current facility-administered medications on file prior to visit.     Review of Systems Review of Systems  Constitutional: Negative for fever, appetite change,  and unexpected weight change.  Eyes: Negative for pain and visual disturbance.  Respiratory: Negative for cough and shortness of breath.   Cardiovascular: Negative for cp or palpitations    Gastrointestinal: Negative for nausea, diarrhea and constipation.  Genitourinary: Negative for urgency and frequency.  Skin: Negative for pallor or rash   MSK pos for knee and other joint pain  Neurological: Negative for weakness, light-headedness, numbness and headaches.  Hematological: Negative for adenopathy. Does not bruise/bleed easily.  Psychiatric/Behavioral: Negative for dysphoric mood. The patient is not nervous/anxious.         Objective:   Physical Exam  Constitutional: She appears well-developed and well-nourished. No distress.  HENT:  Head: Normocephalic and atraumatic.  Eyes: Conjunctivae and EOM are normal. Pupils are equal, round, and reactive to light.  Cardiovascular: Normal rate and regular rhythm.   Musculoskeletal: She exhibits tenderness.  No obvious knee swelling or effusion Exam of knees is difficult-as pt will barely let me touch them - exquisitely tender-she is tearful when touched  Worst at patellar tendons, less at joint lines  Can flex 90 deg with pain   Neurological: She is alert.  Skin: Skin is warm and dry. No rash noted. No erythema.  Psychiatric:  Pt is tearful at times when disc her pain           Assessment & Plan:   Problem List Items Addressed This Visit      Other   Knee pain, bilateral - Primary    Ongoing  Per pt dx with OA and has had injections Px meloxicam 15 mg with food qd Will ref to orthopedics when she has ins in April

## 2015-01-07 NOTE — Progress Notes (Signed)
Pre visit review using our clinic review tool, if applicable. No additional management support is needed unless otherwise documented below in the visit note. 

## 2015-01-07 NOTE — Patient Instructions (Signed)
Use cold compresses on knees when you can  Try using using the exercise bike gradually  Start meloxicam 15 mg daily with food-stop it GI upset  Don't take indocin on the same day as meloxicam (if you have a gout flare) Keep working on healthy habits and weight loss  When you get insurance in April - call us for an orthopedic referral

## 2015-01-07 NOTE — Telephone Encounter (Signed)
I will see her then  

## 2015-01-08 NOTE — Assessment & Plan Note (Signed)
Ongoing  Per pt dx with OA and has had injections Px meloxicam 15 mg with food qd Will ref to orthopedics when she has ins in April

## 2015-02-17 ENCOUNTER — Encounter: Payer: Self-pay | Admitting: Family Medicine

## 2015-02-17 ENCOUNTER — Ambulatory Visit
Admission: RE | Admit: 2015-02-17 | Discharge: 2015-02-17 | Disposition: A | Discharge status: Home / Self Care | Payer: Medicare Other | Source: Ambulatory Visit | Attending: Family Medicine | Admitting: Family Medicine

## 2015-02-17 ENCOUNTER — Ambulatory Visit (INDEPENDENT_AMBULATORY_CARE_PROVIDER_SITE_OTHER)
Admission: RE | Admit: 2015-02-17 | Discharge: 2015-02-17 | Disposition: A | Discharge status: Home / Self Care | Payer: Medicare Other | Source: Ambulatory Visit | Attending: Family Medicine | Admitting: Family Medicine

## 2015-02-17 ENCOUNTER — Ambulatory Visit (INDEPENDENT_AMBULATORY_CARE_PROVIDER_SITE_OTHER): Payer: Medicare Other | Admitting: Family Medicine

## 2015-02-17 VITALS — BP 135/85 | HR 76 | Temp 98.7°F | Ht 62.5 in | Wt 210.5 lb

## 2015-02-17 DIAGNOSIS — M19012 Primary osteoarthritis, left shoulder: Secondary | ICD-10-CM | POA: Diagnosis not present

## 2015-02-17 DIAGNOSIS — M1612 Unilateral primary osteoarthritis, left hip: Secondary | ICD-10-CM | POA: Diagnosis not present

## 2015-02-17 DIAGNOSIS — M25552 Pain in left hip: Secondary | ICD-10-CM | POA: Diagnosis not present

## 2015-02-17 DIAGNOSIS — M25512 Pain in left shoulder: Secondary | ICD-10-CM

## 2015-02-17 DIAGNOSIS — R110 Nausea: Secondary | ICD-10-CM | POA: Diagnosis not present

## 2015-02-17 MED ORDER — CYCLOBENZAPRINE HCL 10 MG PO TABS: ORAL_TABLET | ORAL | Status: DC

## 2015-02-17 MED ORDER — ONDANSETRON HCL 4 MG PO TABS: 4.0000 mg | ORAL_TABLET | Freq: Three times a day (TID) | ORAL | Status: DC | PRN

## 2015-02-17 NOTE — Progress Notes (Signed)
Pre visit review using our clinic review tool, if applicable. No additional management support is needed unless otherwise documented below in the visit note. 

## 2015-02-17 NOTE — Progress Notes (Signed)
Subjective:    Patient ID: Dana Greer, female    DOB: [birth year], 44 y.o.   MRN: [id number]  HPI Here with more joint problems   Hips are hurting/also popping  She has to sit from standing frequently to relieve pain -this comes and goes  Walking - often ok  Could not get up from the floor by herself on a bad day   She does get pain in the groin   Shoulder issues  Started with L shoulder - thought she slept wrong on it - improved a bit  Then she had a fall down the steps and landed funny-it twisted    (mis stepped on a broken stairway -making the turn- dog ran under her legs)  Cannot extend fully or externally rotate Pain is worse over the top of her shoulder    Patient Active Problem List   Diagnosis Date Noted  . Knee pain, bilateral 01/07/2015  . Gout 12/22/2014  . Visit for routine gyn exam 08/04/2014  . Visit for screening mammogram 08/04/2014  . Pain, joint, multiple sites 05/26/2014  . Viral URI with cough 04/07/2014  . ETD (eustachian tube dysfunction) 04/07/2014  . Vertiginous migraine 02/03/2014  . Right hand pain 09/24/2013  . Coccyx pain 03/27/2013  . Sacroiliac dysfunction 03/27/2013  . Thrombocytopenia 10/01/2012  . Intractable migraine 08/31/2012  . Osteoarthritis 05/11/2012  . Female bladder prolapse 05/11/2012  . Hypokalemia 05/11/2012  . Allergic conjunctivitis 08/11/2011  . ADJ DISORDER WITH MIXED ANXIETY & DEPRESSED MOOD 06/30/2008  . HYPERHIDROSIS 01/25/2008  . BACK PAIN, LUMBAR, CHRONIC 10/16/2007  . REACTION, ACUTE STRESS W/EMOTIONAL DSTURB 08/01/2007  . HYPERGLYCEMIA 08/01/2007  . ELEVATED BLOOD PRESSURE WITHOUT DIAGNOSIS OF HYPERTENSION 08/01/2007  . GOITER NOS 07/11/2007  . POLYCYSTIC OVARIAN DISEASE 05/24/2007  . HIRSUTISM 05/24/2007  . ARTHROPATHY NOS, MULTIPLE SITES 05/24/2007   Past Medical History  Diagnosis Date  . Arthritis   . Depression   . Headache(784.0)   . HSV-2 infection   . Thrombocytopenia 10/01/2012  . Immune  deficiency disorder   . Stroke    Past Surgical History  Procedure Laterality Date  . Partial hysterectomy  2012    due to fibroid  . Wisdom tooth extraction  1995  . Cesarean section  2008    x1  . Abdominal hysterectomy     History  Substance Use Topics  . Smoking status: Never Smoker   . Smokeless tobacco: Never Used     Comment: Pt states she smokes.....but "not cigarettes"  . Alcohol Use: 1.2 oz/week    2 Glasses of wine per week     Comment: occ   Family History  Problem Relation Age of Onset  . Diabetes Father   . Cancer Paternal Aunt     breast  . Cancer Paternal Grandmother     colon CA   Allergies  Allergen Reactions  . Trazodone And Nefazodone Nausea And Vomiting  . Amoxicillin Other (See Comments)    Yeast infection   . Carafate [Sucralfate] Nausea And Vomiting   Current Outpatient Prescriptions on File Prior to Visit  Medication Sig Dispense Refill  . ALPRAZolam (XANAX) 1 MG tablet Take 0.5 mg by mouth 2 (two) times daily as needed for anxiety.     . cyclobenzaprine (FLEXERIL) 10 MG tablet Take 1/2 to1 pill up tid prn for headache 30 tablet 0  . etonogestrel (IMPLANON) 68 MG IMPL implant Inject 1 each into the skin continuous.    . fluticasone (FLONASE)  50 MCG/ACT nasal spray Place 2 sprays into both nostrils daily. (Patient taking differently: Place 2 sprays into both nostrils 2 (two) times daily. ) 16 g 6  . indomethacin (INDOCIN) 50 MG capsule Take 1 capsule (50 mg total) by mouth 3 (three) times daily with meals. As needed for gout pain 30 capsule 1  . meloxicam (MOBIC) 15 MG tablet Take 1 tablet (15 mg total) by mouth daily. With a meal 90 tablet 3  . omeprazole (PRILOSEC) 40 MG capsule Take 1 capsule (40 mg total) by mouth daily. 14 capsule 0  . PARoxetine (PAXIL) 40 MG tablet Take 1 tablet (40 mg total) by mouth daily. 30 tablet 11  . tetrahydrozoline 0.05 % ophthalmic solution Place 1 drop into both eyes daily as needed (for dry eyes).    .  valACYclovir (VALTREX) 500 MG tablet Take 1 tablet (500 mg total) by mouth daily. 30 tablet 3   No current facility-administered medications on file prior to visit.      Baseline knee problems - has osteoarthritis    Review of Systems Review of Systems  Constitutional: Negative for fever, appetite change, fatigue and unexpected weight change.  Eyes: Negative for pain and visual disturbance.  Respiratory: Negative for cough and shortness of breath.   Cardiovascular: Negative for cp or palpitations    Gastrointestinal: Negative for vomiting, diarrhea and constipation. neg for blood in stool or dark stool Genitourinary: Negative for urgency and frequency.  MSK pos for pain in multiple joints without swelling or redness, pos for low back pain with rad to legs  Skin: Negative for pallor or rash   Neurological: Negative for weakness, light-headedness, numbness and headaches.  Hematological: Negative for adenopathy. Does not bruise/bleed easily.  Psychiatric/Behavioral: Negative for dysphoric mood. The patient is not nervous/anxious.         Objective:   Physical Exam  Constitutional: She appears well-developed and well-nourished. No distress.  obese and well appearing   HENT:  Head: Normocephalic and atraumatic.  Eyes: Conjunctivae and EOM are normal. Pupils are equal, round, and reactive to light. No scleral icterus.  Neck: Normal range of motion. Neck supple.  Cardiovascular: Normal rate and regular rhythm.   Pulmonary/Chest: Effort normal and breath sounds normal.  Musculoskeletal: She exhibits tenderness. She exhibits no edema.       Left shoulder: She exhibits decreased range of motion and tenderness. She exhibits no swelling, no effusion, no crepitus, no deformity and normal strength.       Left hip: She exhibits decreased range of motion. She exhibits no tenderness, no bony tenderness, no swelling and no crepitus.  L shoulder- difficulty with abduction and int/ext rotation due  to pain  Pos Hawking and Neer exams  Nl grip  Mild acromion tenderness  L hip- pain on internal rotation  Nl flexion  No bony or trochanteric tenderness   Lymphadenopathy:    She has no cervical adenopathy.  Neurological: She is alert. She has normal strength and normal reflexes. She displays no atrophy. She exhibits normal muscle tone.  Skin: Skin is warm and dry. No rash noted. No erythema.  Psychiatric: She has a normal mood and affect.          Assessment & Plan:   Problem List Items Addressed This Visit      Other   Left hip pain - Primary    Pt c/o popping and pain in the groin area with activity on that side  Xray of hip/pelvis today Ref  to orthopedics       Relevant Orders   DG HIP UNILAT WITH PELVIS 2-3 VIEWS LEFT (Completed)   Ambulatory referral to Orthopedic Surgery   Nausea without vomiting    Ongoing and ? If medication rel or other  Not vomiting  No abdominal pain  Pt desired refill of zofran  This problem will require additional w/u after orthopedic eval        Shoulder pain, left    In the setting of chronic pain in multiple joints  Did have a fall which exacerbated it  Pain over acromion with some pos signs of rotator cuff problems  Xray today Ref to orthopedics       Relevant Orders   DG Shoulder Left (Completed)   Ambulatory referral to Orthopedic Surgery

## 2015-02-17 NOTE — Patient Instructions (Signed)
xrays today  Stop at check out for referral to orthopedics  Indocin with food as needed Tylenol as needed Flexeril as needed

## 2015-02-18 ENCOUNTER — Telehealth: Payer: Self-pay

## 2015-02-18 MED ORDER — PROMETHAZINE HCL 25 MG PO TABS: 25.0000 mg | ORAL_TABLET | Freq: Three times a day (TID) | ORAL | Status: DC | PRN

## 2015-02-18 NOTE — Telephone Encounter (Signed)
Pt advise Rx for phenergan sent to pharmacy. Pt also wanted me to let you know she does want to proceed with the ortho referral due to her shoulder xray results you released on mychart

## 2015-02-18 NOTE — Telephone Encounter (Signed)
I will send phenergan Watch out for sedation with this

## 2015-02-18 NOTE — Telephone Encounter (Signed)
Pt left v/m that generic zofran cost to pt was $909.00. Pt could not afford to pick up rx. Pt request substitute med sent to [facility].pt request cb.

## 2015-02-18 NOTE — Telephone Encounter (Signed)
I already did the referral so she will be hearing from our office Of note-now she has insurance and can see someone

## 2015-02-19 NOTE — Assessment & Plan Note (Signed)
Pt c/o popping and pain in the groin area with activity on that side  Xray of hip/pelvis today Ref to orthopedics

## 2015-02-19 NOTE — Assessment & Plan Note (Addendum)
Ongoing and ? If medication rel or other  Not vomiting  No abdominal pain  Pt desired refill of zofran  This problem will require additional w/u after orthopedic eval

## 2015-02-19 NOTE — Assessment & Plan Note (Signed)
In the setting of chronic pain in multiple joints  Did have a fall which exacerbated it  Pain over acromion with some pos signs of rotator cuff problems  Xray today Ref to orthopedics

## 2015-02-22 NOTE — Op Note (Signed)
PATIENT NAME:  Dana, Greer MR#:  [id number] DATE OF BIRTH:  1971/07/01  DATE OF PROCEDURE:  11/22/2011  PREOPERATIVE DIAGNOSES: Abnormal uterine bleeding.   POSTOPERATIVE DIAGNOSIS: Abnormal uterine bleeding, probable adenomyosis and leiomyoma. removal of implanon  PROCEDURES PERFORMED:  1. Diagnostic laparoscopy.  2. Exploratory laparotomy. 3. Supracervical hysterectomy. 4,    Removal of implanon   SURGEON: Wonda Cheng. Laurey Morale, M.D.   OPERATIVE FINDINGS: Large multiparous size uterus with a couple of small leiomyomas.   DESCRIPTION OF PROCEDURE: After adequate general anesthesia, the patient was prepped and draped in routine fashion. Approximately 3 liters of carbon dioxide were insufflated without incident. During insufflation the bladder was drained and uterine manipulator was placed. An infraumbilical incision was made and attempts to insert the laparoscopic trocar were unsuccessful. At one point in time, I decided that the risks of continuing to gain intraabdominal access was balanced off  by the risks of bowel injury; therefore, this was terminated. The patient was completely prepped and redraped in the lithotomy position. A skin incision in modified Pfannenstiel fashion was made and carried down through the various layers and the peritoneal cavity was entered. The bowel was packed back. The uterus was grasped with a double-tooth tenaculum. The round ligament was clamped, divided, and suture ligated. A bladder flap was created and the bladder was pushed down. A pedicle was created by blunt dissection incorporating the suspensory ligament, ovaries, tubes, and broad ligaments. These were doubly clamped, divided and doubly suture ligated bilaterally leaving both normal tubes and ovaries in situ. Cardinal ligaments were serially clamped, divided, and suture ligated. Uterine vessels were doubly clamped, divided and doubly suture ligated bilaterally. The specimen was amputated. The cuff of the  cervical specimen was then closed with interrupted sutures. The pelvis was lavaged with copious amounts of saline and all areas of surgery were inspected and found to be hemostatic. The rectus muscles were reapproximated in the midline. An On-Q pain relief pump was inserted per protocol. The rectus muscles were reapproximated with two continuous sutures of Maxon. The skin was closed with skin staples. Estimated blood loss was 300 mL. The patient tolerated the procedure well and left the operating room in good condition. Sponge and needle counts were said to be correct at the end of the procedure.  the left upper arm was prepped with betadine and the previously placed implanon was removed though a small incision ____________________________ Wonda Cheng. Laurey Morale, MD pjr:slb D: 11/22/2011 11:00:05 ET T: 11/22/2011 12:18:21 ET JOB#: [id number]  cc: Wonda Cheng. Laurey Morale, MD, <Dictator> Rosina Lowenstein MD ELECTRONICALLY SIGNED 11/23/2011 18:11

## 2015-03-02 DIAGNOSIS — M25512 Pain in left shoulder: Secondary | ICD-10-CM | POA: Diagnosis not present

## 2015-03-02 DIAGNOSIS — M5410 Radiculopathy, site unspecified: Secondary | ICD-10-CM | POA: Diagnosis not present

## 2015-03-02 DIAGNOSIS — M25561 Pain in right knee: Secondary | ICD-10-CM | POA: Diagnosis not present

## 2015-03-02 DIAGNOSIS — M25562 Pain in left knee: Secondary | ICD-10-CM | POA: Diagnosis not present

## 2015-03-04 ENCOUNTER — Other Ambulatory Visit: Payer: Self-pay | Admitting: Orthopaedic Surgery

## 2015-03-04 DIAGNOSIS — M545 Low back pain: Secondary | ICD-10-CM

## 2015-03-13 ENCOUNTER — Ambulatory Visit
Admission: RE | Admit: 2015-03-13 | Discharge: 2015-03-13 | Disposition: A | Discharge status: Home / Self Care | Payer: Medicare Other | Source: Ambulatory Visit | Attending: Orthopaedic Surgery | Admitting: Orthopaedic Surgery

## 2015-03-13 DIAGNOSIS — M5127 Other intervertebral disc displacement, lumbosacral region: Secondary | ICD-10-CM | POA: Diagnosis not present

## 2015-03-13 DIAGNOSIS — M47817 Spondylosis without myelopathy or radiculopathy, lumbosacral region: Secondary | ICD-10-CM | POA: Diagnosis not present

## 2015-03-13 DIAGNOSIS — M545 Low back pain: Secondary | ICD-10-CM

## 2015-03-16 ENCOUNTER — Encounter: Payer: Self-pay | Admitting: Family Medicine

## 2015-03-16 ENCOUNTER — Ambulatory Visit (INDEPENDENT_AMBULATORY_CARE_PROVIDER_SITE_OTHER): Payer: Medicare Other | Admitting: Family Medicine

## 2015-03-16 ENCOUNTER — Ambulatory Visit (INDEPENDENT_AMBULATORY_CARE_PROVIDER_SITE_OTHER)
Admission: RE | Admit: 2015-03-16 | Discharge: 2015-03-16 | Disposition: A | Discharge status: Home / Self Care | Payer: Medicare Other | Source: Ambulatory Visit | Attending: Family Medicine | Admitting: Family Medicine

## 2015-03-16 VITALS — BP 128/80 | HR 83 | Temp 98.6°F | Ht 62.5 in | Wt 215.5 lb

## 2015-03-16 DIAGNOSIS — M2550 Pain in unspecified joint: Secondary | ICD-10-CM

## 2015-03-16 DIAGNOSIS — M79644 Pain in right finger(s): Secondary | ICD-10-CM | POA: Diagnosis not present

## 2015-03-16 DIAGNOSIS — G5601 Carpal tunnel syndrome, right upper limb: Secondary | ICD-10-CM | POA: Diagnosis not present

## 2015-03-16 DIAGNOSIS — M5410 Radiculopathy, site unspecified: Secondary | ICD-10-CM | POA: Diagnosis not present

## 2015-03-16 DIAGNOSIS — G5600 Carpal tunnel syndrome, unspecified upper limb: Secondary | ICD-10-CM | POA: Insufficient documentation

## 2015-03-16 MED ORDER — GABAPENTIN 100 MG PO CAPS: 100.0000 mg | ORAL_CAPSULE | Freq: Two times a day (BID) | ORAL | Status: DC

## 2015-03-16 NOTE — Assessment & Plan Note (Signed)
R first MCP joint is tender/ mildly swollen (but no redness or heat like gout)  XR today  Disc poss of OA  Also having other joint c/o  Also carpal tunnel symptoms -see sep assessment / trial of wrist splint  F/u 1 mo

## 2015-03-16 NOTE — Assessment & Plan Note (Signed)
No specific cause so far-pt is frustrated Also hx of headaches and depression /anx (working on with her psychiatrist) Disc poss of fibromyalgia -counseled on this/ dx of exclusion and involves overly sensitive nerves Trial of gabapentin for sleep and pain  Begin 100 mg at bedtime and then inc to bid  F/u 1 mo  Rev side eff in detail : Discussed expectations of medication including time to effectiveness and mechanism of action, also poss of side effects (early and late)- including mental fuzziness, weight or appetite change, nausea and poss of worse dep or anxiety (even suicidal thoughts)  Pt voiced understanding and will stop med and update if this occurs

## 2015-03-16 NOTE — Patient Instructions (Signed)
Xray of thumb today  Use cold compress on wrist for inflammation Use your carpal tunnel brace whenever you can  Start gabapenin 100 mg at bedtime  A week later if doing ok - start 100 in am and 100 at bedtime  You will probably feel sleepy and slightly dizzy early on -this should get better fairly quickly- if not stop it and let me know  Follow up with me in about 4 weeks   Low impact exercise is a good idea as tolerated

## 2015-03-16 NOTE — Progress Notes (Signed)
Subjective:    Patient ID: Dana Greer, female    DOB: 01-13-1971, 44 y.o.   MRN: [id number]  HPI Here with R hand pain - it is going up her wrist and also her arm   Thinks her thumb joint is swollen  Thumb joint - is swollen- but not warm to the touch  Hurts so bad that the sheet hurts it to touch  Tearful today   Finger tips tend to be numb  Had carpal tunnel when she was pregnant  R handed and R hand is more symptomatic  Also has tingling and pain in feet   And not getting any answers to her problems with joint pain  Various places /knee/shoulder/hands  Has been to rheum and ortho  Very frustrated   Also sleeping issues -has tried many things  Psychiatrist is working on tx for mood and sleep   ? If poss fibromyalgia   Also hx of headaches  Patient Active Problem List   Diagnosis Date Noted  . Left hip pain 02/17/2015  . Shoulder pain, left 02/17/2015  . Nausea without vomiting 02/17/2015  . Knee pain, bilateral 01/07/2015  . Gout 12/22/2014  . Visit for routine gyn exam 08/04/2014  . Visit for screening mammogram 08/04/2014  . Pain, joint, multiple sites 05/26/2014  . Viral URI with cough 04/07/2014  . ETD (eustachian tube dysfunction) 04/07/2014  . Vertiginous migraine 02/03/2014  . Right hand pain 09/24/2013  . Coccyx pain 03/27/2013  . Sacroiliac dysfunction 03/27/2013  . Thrombocytopenia 10/01/2012  . Intractable migraine 08/31/2012  . Osteoarthritis 05/11/2012  . Female bladder prolapse 05/11/2012  . Hypokalemia 05/11/2012  . Allergic conjunctivitis 08/11/2011  . ADJ DISORDER WITH MIXED ANXIETY & DEPRESSED MOOD 06/30/2008  . HYPERHIDROSIS 01/25/2008  . BACK PAIN, LUMBAR, CHRONIC 10/16/2007  . REACTION, ACUTE STRESS W/EMOTIONAL DSTURB 08/01/2007  . HYPERGLYCEMIA 08/01/2007  . ELEVATED BLOOD PRESSURE WITHOUT DIAGNOSIS OF HYPERTENSION 08/01/2007  . GOITER NOS 07/11/2007  . POLYCYSTIC OVARIAN DISEASE 05/24/2007  . HIRSUTISM 05/24/2007  .  ARTHROPATHY NOS, MULTIPLE SITES 05/24/2007   Past Medical History  Diagnosis Date  . Arthritis   . Depression   . Headache(784.0)   . HSV-2 infection   . Thrombocytopenia 10/01/2012  . Immune deficiency disorder   . Stroke    Past Surgical History  Procedure Laterality Date  . Partial hysterectomy  2012    due to fibroid  . Wisdom tooth extraction  1995  . Cesarean section  2008    x1  . Abdominal hysterectomy     History  Substance Use Topics  . Smoking status: Never Smoker   . Smokeless tobacco: Never Used     Comment: Pt states she smokes.....but "not cigarettes"  . Alcohol Use: 1.2 oz/week    2 Glasses of wine per week     Comment: occ   Family History  Problem Relation Age of Onset  . Diabetes Father   . Cancer Paternal Aunt     breast  . Cancer Paternal Grandmother     colon CA   Allergies  Allergen Reactions  . Trazodone And Nefazodone Nausea And Vomiting  . Amoxicillin Other (See Comments)    Yeast infection   . Carafate [Sucralfate] Nausea And Vomiting   Current Outpatient Prescriptions on File Prior to Visit  Medication Sig Dispense Refill  . ALPRAZolam (XANAX) 1 MG tablet Take 0.5 mg by mouth 2 (two) times daily as needed for anxiety.     . cyclobenzaprine (  FLEXERIL) 10 MG tablet Take 1/2 to1 pill up tid prn for headache or muscle pain 30 tablet 3  . etonogestrel (IMPLANON) 68 MG IMPL implant Inject 1 each into the skin continuous.    . fluticasone (FLONASE) 50 MCG/ACT nasal spray Place 2 sprays into both nostrils daily. (Patient taking differently: Place 2 sprays into both nostrils 2 (two) times daily. ) 16 g 6  . indomethacin (INDOCIN) 50 MG capsule Take 1 capsule (50 mg total) by mouth 3 (three) times daily with meals. As needed for gout pain 30 capsule 1  . omeprazole (PRILOSEC) 40 MG capsule Take 1 capsule (40 mg total) by mouth daily. 14 capsule 0  . PARoxetine (PAXIL) 40 MG tablet Take 1 tablet (40 mg total) by mouth daily. 30 tablet 11  .  promethazine (PHENERGAN) 25 MG tablet Take 1 tablet (25 mg total) by mouth every 8 (eight) hours as needed for nausea or vomiting. 30 tablet 0  . tetrahydrozoline 0.05 % ophthalmic solution Place 1 drop into both eyes daily as needed (for dry eyes).    . valACYclovir (VALTREX) 500 MG tablet Take 1 tablet (500 mg total) by mouth daily. 30 tablet 3   No current facility-administered medications on file prior to visit.     Review of Systems Review of Systems  Constitutional: Negative for fever, appetite change, and unexpected weight change. pos for fatigue and insomnia  Eyes: Negative for pain and visual disturbance.  Respiratory: Negative for cough and shortness of breath.   Cardiovascular: Negative for cp or palpitations    Gastrointestinal: Negative for nausea, diarrhea and constipation.  Genitourinary: Negative for urgency and frequency.  Skin: Negative for pallor or rash   MSK pos for joint pain in numerous places/ neg for trigger points Neurological: Negative for weakness, light-headedness, and pos for headaches. pos for pins and needle feelings in fingers and feet  Hematological: Negative for adenopathy. Does not bruise/bleed easily.  Psychiatric/Behavioral: Negative for dysphoric mood. The patient is not nervous/anxious.         Objective:   Physical Exam  Constitutional: She appears well-developed and well-nourished. No distress.  obese and well appearing   HENT:  Head: Normocephalic and atraumatic.  Eyes: Conjunctivae and EOM are normal. Pupils are equal, round, and reactive to light. No scleral icterus.  Neck: Normal range of motion. Neck supple. No thyromegaly present.  Cardiovascular: Normal rate and regular rhythm.   Pulmonary/Chest: Effort normal and breath sounds normal.  Musculoskeletal: She exhibits tenderness. She exhibits no edema.       Right hand: She exhibits decreased range of motion and tenderness. She exhibits normal capillary refill, no deformity and no  laceration. Decreased sensation noted. Decreased sensation is present in the radial distribution. Normal strength noted.  R first MCP is tender and very slt swollen when comp to L one  No crepitus Nl rom with pain on full flexion  No erythema or warmth   Pos tinel/phalen tests on R wrist   Dec sens to soft touch /temp in radial distribution     Lymphadenopathy:    She has no cervical adenopathy.  Neurological: She is alert. She has normal reflexes. She displays no atrophy. No cranial nerve deficit. She exhibits normal muscle tone. Coordination normal.  Skin: Skin is warm and dry. No rash noted. No erythema.  Psychiatric: Her speech is normal and behavior is normal. Her mood appears anxious.  Tearful when discussing her pain  Assessment & Plan:   Problem List Items Addressed This Visit    Carpal tunnel syndrome    Pt has had symptoms in R hand on and off since pregnancy  Pos tinel and phalen tests  Will go back to her wrist splint and wear prn for the next several weeks Disc use of ice  May need neuro ref for NCV - will disc at re check  Trying gabapentin for chronic pain -this may aid her as well  udpate if symptoms worsen F/u 1 mo       Relevant Medications   gabapentin (NEURONTIN) 100 MG capsule   Pain of right thumb    R first MCP joint is tender/ mildly swollen (but no redness or heat like gout)  XR today  Disc poss of OA  Also having other joint c/o  Also carpal tunnel symptoms -see sep assessment / trial of wrist splint  F/u 1 mo        Relevant Orders   DG Hand Complete Right   Pain, joint, multiple sites - Primary    No specific cause so far-pt is frustrated Also hx of headaches and depression /anx (working on with her psychiatrist) Disc poss of fibromyalgia -counseled on this/ dx of exclusion and involves overly sensitive nerves Trial of gabapentin for sleep and pain  Begin 100 mg at bedtime and then inc to bid  F/u 1 mo  Rev side eff in  detail : Discussed expectations of medication including time to effectiveness and mechanism of action, also poss of side effects (early and late)- including mental fuzziness, weight or appetite change, nausea and poss of worse dep or anxiety (even suicidal thoughts)  Pt voiced understanding and will stop med and update if this occurs

## 2015-03-16 NOTE — Progress Notes (Signed)
Pre visit review using our clinic review tool, if applicable. No additional management support is needed unless otherwise documented below in the visit note. 

## 2015-03-16 NOTE — Assessment & Plan Note (Signed)
Pt has had symptoms in R hand on and off since pregnancy  Pos tinel and phalen tests  Will go back to her wrist splint and wear prn for the next several weeks Disc use of ice  May need neuro ref for NCV - will disc at re check  Trying gabapentin for chronic pain -this may aid her as well  udpate if symptoms worsen F/u 1 mo

## 2015-03-18 ENCOUNTER — Telehealth: Payer: Self-pay

## 2015-03-18 NOTE — Telephone Encounter (Signed)
Pt notified of Dr. Marliss Coots comments/recommendations and appt with Dr. Lorelei Pont scheduled

## 2015-03-18 NOTE — Telephone Encounter (Signed)
Pt left v/m; pt was seen 03/16/15; pt has been taking gabapentin which has helped the sensation in the arm and the fingers but her thumb still hurts and pt cannot move the thumb. Pt took gabapentin bid on 03/17/15. Pt wants to know what else can be done for the thumb. Pt request cb.

## 2015-03-18 NOTE — Telephone Encounter (Signed)
Please make an appointment with Dr Lorelei Pont - I wonder if she has some tendonitis that he could help out with  Is she tolerating the gabapentin so far?  When she has been on bid for a week we can raise dose if she wants to

## 2015-03-25 ENCOUNTER — Ambulatory Visit (INDEPENDENT_AMBULATORY_CARE_PROVIDER_SITE_OTHER): Payer: Medicare Other | Admitting: Family Medicine

## 2015-03-25 ENCOUNTER — Encounter: Payer: Self-pay | Admitting: Family Medicine

## 2015-03-25 VITALS — BP 150/98 | HR 77 | Temp 98.8°F | Ht 62.5 in | Wt 212.5 lb

## 2015-03-25 DIAGNOSIS — M6588 Other synovitis and tenosynovitis, other site: Secondary | ICD-10-CM | POA: Diagnosis not present

## 2015-03-25 DIAGNOSIS — M67912 Unspecified disorder of synovium and tendon, left shoulder: Secondary | ICD-10-CM | POA: Diagnosis not present

## 2015-03-25 DIAGNOSIS — M25512 Pain in left shoulder: Secondary | ICD-10-CM | POA: Diagnosis not present

## 2015-03-25 DIAGNOSIS — M659 Synovitis and tenosynovitis, unspecified: Secondary | ICD-10-CM

## 2015-03-25 MED ORDER — DICLOFENAC SODIUM 75 MG PO TBEC: 75.0000 mg | DELAYED_RELEASE_TABLET | Freq: Two times a day (BID) | ORAL | Status: DC

## 2015-03-25 NOTE — Progress Notes (Signed)
Dr. Frederico Hamman T. Praneeth Bussey, MD, [facility] [place], [address] Phone: [phone] Fax: [phone]  03/25/2015  Patient: Dana Greer, MRN: [id number], DOB: Nov 06, 1970, 44 y.o.  Primary Physician:  Loura Pardon, MD  Chief Complaint: Hand Pain and Shoulder Pain  Subjective:   Dana Greer is a 44 y.o. very pleasant female patient who presents with the following:  Right hand pain. Thumb, cannot bathe. This morning went to the bath at 1:30 AM.  Flexor tendon sheath, 1st This is been ongoing for approximately a month, and the patient does not have any specific injury or trauma that she can recall. 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  Her pain is primarily at the flexor sheath and she has pain with extension at the first digit on the right, and she also has some decreased strength.  Primarily this is with flexion.  Left shoulder. 1 month, l shoulder.  Fell on bottom, somehow moved it and hit the hand. She is having some pain with abduction, terminal internal range of motion, and she is not having any radicular symptoms.  She is not having any prior history of significant shoulder injuries or operations.   Past Medical History, Surgical History, Social History, Family History, Problem List, Medications, and Allergies have been reviewed and updated if relevant.  GEN: No fevers, chills. Nontoxic. Primarily MSK c/o today. MSK: Detailed in the HPI GI: tolerating PO intake without difficulty Neuro: No numbness, parasthesias, or tingling associated. Otherwise the pertinent positives of the ROS are noted above.   Objective:   BP 150/98 mmHg  Pulse 77  Temp(Src) 98.8 F (37.1 C) (Oral)  Ht 5' 2.5" (1.588 m)  Wt 212 lb 8 oz (96.389 kg)  BMI 38.22 kg/m2  GEN: WDWN, NAD, Non-toxic, Alert & Oriented x 3 HEENT: Atraumatic, Normocephalic.  Ears and  Nose: No external deformity. EXTR: No clubbing/cyanosis/edema NEURO: Normal gait.  PSYCH: Normally interactive. Conversant. Not depressed or anxious appearing.  Calm demeanor.   Hand: R Ecchymosis or edema: neg ROM wrist/hand/digits/elbow: full, but ext of 1st digit limited and causes pain Carpals, MCP's, digits: NT Distal Ulna and Radius: NT Supination lift test: neg Ecchymosis or edema: neg Cysts/nodules: neg Finkelstein's test: neg Snuffbox tenderness: neg Scaphoid tubercle: NT Hook of Hamate: NT Resisted supination: NT Full composite fist Grip, all digits: 4+/5 str Axial load test: neg Phalen's: neg Tinel's: neg Atrophy: neg  Hand sensation: intact   Shoulder: L Inspection: No muscle wasting or winging Ecchymosis/edema: neg  AC joint, scapula, clavicle: NT Cervical spine: NT, full ROM Spurling's: neg Abduction: full, 5/5 Flexion: full, 5/5 IR, full, lift-off: 5/5 ER at neutral: full, 5/5 AC crossover: neg Neer: pos Hawkins: pos Drop Test: neg Empty Can: pos Supraspinatus insertion: mild-mod T Bicipital groove: NT Speed's: neg Yergason's: neg Sulcus sign: neg Scapular dyskinesis: none C5-T1 intact  Neuro: Sensation intact Grip 5/5   Radiology: Mr Lumbar Spine Wo Contrast  03/14/2015   CLINICAL DATA:  Pain and weakness in the BILATERAL legs for 2 years.  EXAM: MRI LUMBAR SPINE WITHOUT CONTRAST  TECHNIQUE: Multiplanar, multisequence MR imaging of the lumbar spine was performed. No intravenous contrast was administered.  COMPARISON:  None.  FINDINGS: Segmentation: Normal.  Alignment: Normal. No subluxation. Slight scoliosis convex LEFT upper lumbar region without significant RIGHT-sided neural impingement or disc space narrowing.  Vertebrae: No worrisome osseous  lesion.  Conus medullaris: Normal in size, signal, and location.  Paraspinal tissues: No evidence for hydronephrosis or paravertebral mass.  Disc levels:  L1-L2:  Normal.  L2-L3:  Normal.  L3-L4:  Normal  disc space.  Mild facet arthropathy.  L4-L5:  Normal disc space. Mild facet arthropathy.  L5-S1:  Mild bulge. Mild facet arthropathy. No impingement.   Electronically Signed   By: Rolla Flatten M.D.   On: 03/14/2015 11:51   Dg Hand Complete Right  03/16/2015   CLINICAL DATA:  Pain of RIGHT thumb at MCP joint  EXAM: RIGHT HAND - COMPLETE 3+ VIEW  COMPARISON:  08/13/2014  FINDINGS: Osseous mineralization normal.  Joint spaces preserved.  No fracture, dislocation, or bone destruction.  IMPRESSION: Normal exam.   Electronically Signed   By: Lavonia Dana M.D.   On: 03/16/2015 18:47    Assessment and Plan:   Flexor tenosynovitis of thumb  Left shoulder pain  Tendinopathy of left rotator cuff  The patient is most worried about her hand, and this is clearly flexor tenosynovitis of the first digit.  I'm going to place her in a thumb spica splint, do some icing, anti-inflammatories, and traditional conservative care for 3-4 weeks.  Like her to titrate out of her splint in 3 weeks, and then follow-up with me.  If she is still doing poorly at that time, then it injection of corticosteroid into her tendon sheath would be very reasonable.  She relatively has some mild rotator cuff tendinitis right now, and were just going to do some home rehabilitation.  Follow-up: Return in about 1 month (around 04/25/2015).  New Prescriptions   DICLOFENAC (VOLTAREN) 75 MG EC TABLET    Take 1 tablet (75 mg total) by mouth 2 (two) times daily.   No orders of the defined types were placed in this encounter.    Signed,  Maud Deed. Irasema Chalk, MD   Patient's Medications  New Prescriptions   DICLOFENAC (VOLTAREN) 75 MG EC TABLET    Take 1 tablet (75 mg total) by mouth 2 (two) times daily.  Previous Medications   ALPRAZOLAM (XANAX) 1 MG TABLET    Take 0.5 mg by mouth 2 (two) times daily as needed for anxiety.    CYCLOBENZAPRINE (FLEXERIL) 10 MG TABLET    Take 1/2 to1 pill up tid prn for headache or muscle pain    ETONOGESTREL (IMPLANON) 68 MG IMPL IMPLANT    Inject 1 each into the skin continuous.   FLUTICASONE (FLONASE) 50 MCG/ACT NASAL SPRAY    Place 2 sprays into both nostrils daily.   GABAPENTIN (NEURONTIN) 100 MG CAPSULE    Take 1 capsule (100 mg total) by mouth 2 (two) times daily.   INDOMETHACIN (INDOCIN) 50 MG CAPSULE    Take 1 capsule (50 mg total) by mouth 3 (three) times daily with meals. As needed for gout pain   OMEPRAZOLE (PRILOSEC) 40 MG CAPSULE    Take 1 capsule (40 mg total) by mouth daily.   PAROXETINE (PAXIL) 40 MG TABLET    Take 1 tablet (40 mg total) by mouth daily.   PROMETHAZINE (PHENERGAN) 25 MG TABLET    Take 1 tablet (25 mg total) by mouth every 8 (eight) hours as needed for nausea or vomiting.   TETRAHYDROZOLINE 0.05 % OPHTHALMIC SOLUTION    Place 1 drop into both eyes daily as needed (for dry eyes).   VALACYCLOVIR (VALTREX) 500 MG TABLET    Take 1 tablet (500 mg total) by mouth daily.  Modified Medications   No medications on file  Discontinued Medications   No medications on file

## 2015-03-25 NOTE — Progress Notes (Signed)
Pre visit review using our clinic review tool, if applicable. No additional management support is needed unless otherwise documented below in the visit note. 

## 2015-04-03 ENCOUNTER — Encounter: Payer: Self-pay | Admitting: Internal Medicine

## 2015-04-03 ENCOUNTER — Ambulatory Visit (INDEPENDENT_AMBULATORY_CARE_PROVIDER_SITE_OTHER): Payer: Medicare Other | Admitting: Internal Medicine

## 2015-04-03 ENCOUNTER — Other Ambulatory Visit: Payer: Self-pay | Admitting: *Deleted

## 2015-04-03 VITALS — BP 136/86 | HR 74 | Temp 98.2°F | Wt 209.0 lb

## 2015-04-03 DIAGNOSIS — M10042 Idiopathic gout, left hand: Secondary | ICD-10-CM

## 2015-04-03 DIAGNOSIS — M109 Gout, unspecified: Secondary | ICD-10-CM

## 2015-04-03 MED ORDER — INDOMETHACIN 50 MG PO CAPS
50.0000 mg | ORAL_CAPSULE | Freq: Three times a day (TID) | ORAL | Status: DC
Start: 2015-04-03 — End: 2016-01-13

## 2015-04-03 MED ORDER — HYDROCODONE-ACETAMINOPHEN 5-325 MG PO TABS: 1.0000 | ORAL_TABLET | Freq: Four times a day (QID) | ORAL | Status: DC | PRN

## 2015-04-03 MED ORDER — PREDNISONE 20 MG PO TABS: ORAL_TABLET | ORAL | Status: DC

## 2015-04-03 NOTE — Telephone Encounter (Signed)
Done by Ronny Bacon

## 2015-04-03 NOTE — Telephone Encounter (Signed)
Please refill times 3 

## 2015-04-03 NOTE — Patient Instructions (Signed)

## 2015-04-03 NOTE — Progress Notes (Signed)
Subjective:    Patient ID: Dana Greer, female    DOB: 22-May-1971, 44 y.o.   MRN: [id number]  HPI  Pt present to the clinic today with c/o left hand pain. This started 2-3 days ago. She has noticed some swelling as well. The pain is 10/10. She denies any injury to the area. She has a history of gout and thinks this is what is going on with her hand. She tood indomethacin yesterday with some relief.  Review of Systems      Past Medical History  Diagnosis Date  . Arthritis   . Depression   . Headache(784.0)   . HSV-2 infection   . Thrombocytopenia 10/01/2012  . Immune deficiency disorder   . Stroke     Current Outpatient Prescriptions  Medication Sig Dispense Refill  . ALPRAZolam (XANAX) 1 MG tablet Take 0.5 mg by mouth 2 (two) times daily as needed for anxiety.     . cyclobenzaprine (FLEXERIL) 10 MG tablet Take 1/2 to1 pill up tid prn for headache or muscle pain 30 tablet 3  . diclofenac (VOLTAREN) 75 MG EC tablet Take 1 tablet (75 mg total) by mouth 2 (two) times daily. 50 tablet 0  . etonogestrel (IMPLANON) 68 MG IMPL implant Inject 1 each into the skin continuous.    . fluticasone (FLONASE) 50 MCG/ACT nasal spray Place 2 sprays into both nostrils daily. (Patient taking differently: Place 2 sprays into both nostrils 2 (two) times daily. ) 16 g 6  . gabapentin (NEURONTIN) 100 MG capsule Take 1 capsule (100 mg total) by mouth 2 (two) times daily. 60 capsule 3  . indomethacin (INDOCIN) 50 MG capsule Take 1 capsule (50 mg total) by mouth 3 (three) times daily with meals. As needed for gout pain 30 capsule 3  . omeprazole (PRILOSEC) 40 MG capsule Take 1 capsule (40 mg total) by mouth daily. 14 capsule 0  . PARoxetine (PAXIL) 40 MG tablet Take 1 tablet (40 mg total) by mouth daily. 30 tablet 11  . promethazine (PHENERGAN) 25 MG tablet Take 1 tablet (25 mg total) by mouth every 8 (eight) hours as needed for nausea or vomiting. 30 tablet 0  . tetrahydrozoline 0.05 % ophthalmic  solution Place 1 drop into both eyes daily as needed (for dry eyes).    . valACYclovir (VALTREX) 500 MG tablet Take 1 tablet (500 mg total) by mouth daily. 30 tablet 3   No current facility-administered medications for this visit.    Allergies  Allergen Reactions  . Trazodone And Nefazodone Nausea And Vomiting  . Amoxicillin Other (See Comments)    Yeast infection   . Carafate [Sucralfate] Nausea And Vomiting    Family History  Problem Relation Age of Onset  . Diabetes Father   . Cancer Paternal Aunt     breast  . Cancer Paternal Grandmother     colon CA    History   Social History  . Marital Status: Legally Separated    Spouse Name: N/A  . Number of Children: 3  . Years of Education: N/A   Occupational History  . Collections, [employer]    Social History Main Topics  . Smoking status: Never Smoker   . Smokeless tobacco: Never Used     Comment: Pt states she smokes.....but "not cigarettes"  . Alcohol Use: 1.2 oz/week    2 Glasses of wine per week     Comment: occ  . Drug Use: Yes    Special: Marijuana  . Sexual  Activity: Yes    Birth Control/ Protection: None   Other Topics Concern  . Not on file   Social History Narrative     Constitutional: Denies fever, malaise, fatigue, headache or abrupt weight changes.  Respiratory: Denies difficulty breathing, shortness of breath, cough or sputum production.   Cardiovascular: Denies chest pain, chest tightness, palpitations or swelling in the hands or feet.  Musculoskeletal: Pt reports joint pain and swelling. Denies muscle pain. Skin: Pt reports redness and warmth of left hand. Denies rashes, lesions or ulcercations.   No other specific complaints in a complete review of systems (except as listed in HPI above).  Objective:   Physical Exam   BP 136/86 mmHg  Pulse 74  Temp(Src) 98.2 F (36.8 C) (Oral)  Wt 209 lb (94.802 kg)  SpO2 99% Wt Readings from Last 3 Encounters:  04/03/15 209 lb (94.802 kg)  03/25/15  212 lb 8 oz (96.389 kg)  03/16/15 215 lb 8 oz (97.75 kg)    General: Appears her stated age, well developed, well nourished in NAD. Skin: Warm, dry and intact. Redness and warmth noted over left distal 5th metacarpal. Cardiovascular: Normal rate and rhythm. S1,S2 noted.  No murmur, rubs or gallops noted.  Pulmonary/Chest: Normal effort and positive vesicular breath sounds. No respiratory distress. No wheezes, rales or ronchi noted.  Musculoskeletal: Decreased flexion and extension of fingers on left hand. Swelling noted over the left distal metacarpal. She is crying as I palpate her hand. Neurological: Alert and oriented.   BMET    Component Value Date/Time   NA 140 11/29/2014 1145   K 3.5 11/29/2014 1145   CL 106 11/29/2014 1145   CO2 23 11/29/2014 1145   GLUCOSE 135* 11/29/2014 1145   BUN 10 11/29/2014 1145   CREATININE 1.01 11/29/2014 1145   CALCIUM 10.0 11/29/2014 1145   GFRNONAA 67* 11/29/2014 1145   GFRAA 77* 11/29/2014 1145    Lipid Panel     Component Value Date/Time   CHOL 100 07/25/2007 0852   TRIG 31 07/25/2007 0852   HDL 39.5 07/25/2007 0852   CHOLHDL 2.5 CALC 07/25/2007 0852   VLDL 6 07/25/2007 0852   LDLCALC 54 07/25/2007 0852    CBC    Component Value Date/Time   WBC 8.6 11/29/2014 1145   WBC 5.2 06/07/2013 0852   RBC 4.79 11/29/2014 1145   RBC 4.16 06/07/2013 0852   HGB 15.7* 11/29/2014 1145   HGB 13.3 06/07/2013 0852   HCT 44.4 11/29/2014 1145   HCT 39.7 06/07/2013 0852   HCT 40.7 11/15/2011 0953   PLT 146* 11/29/2014 1145   PLT 146 06/07/2013 0852   MCV 92.7 11/29/2014 1145   MCV 95.4 06/07/2013 0852   MCH 32.8 11/29/2014 1145   MCH 32.0 06/07/2013 0852   MCHC 35.4 11/29/2014 1145   MCHC 33.5 06/07/2013 0852   RDW 13.5 11/29/2014 1145   RDW 13.5 06/07/2013 0852   LYMPHSABS 0.9 11/29/2014 1145   LYMPHSABS 1.9 06/07/2013 0852   MONOABS 0.2 11/29/2014 1145   MONOABS 0.4 06/07/2013 0852   EOSABS 0.0 11/29/2014 1145   EOSABS 0.2 06/07/2013  0852   BASOSABS 0.0 11/29/2014 1145   BASOSABS 0.0 06/07/2013 0852    Hgb A1C Lab Results  Component Value Date   HGBA1C 5.7 07/25/2007        Assessment & Plan:   Gout of left hand:  eRx fo pred taper (no indomethacin, advil or motrin) Will order uric acid level to be checked after  the flare is over, make lab appt on your way out RX for Norco for severe pain  RTC as needed or if symptoms persist or worsen

## 2015-04-03 NOTE — Progress Notes (Signed)
Pre visit review using our clinic review tool, if applicable. No additional management support is needed unless otherwise documented below in the visit note. 

## 2015-04-03 NOTE — Telephone Encounter (Signed)
Fax refill request, last refilled on 12/22/14 #30 with 1 additional refill, please advise

## 2015-04-06 ENCOUNTER — Ambulatory Visit: Payer: Medicare Other | Admitting: Family Medicine

## 2015-04-06 DIAGNOSIS — Z0289 Encounter for other administrative examinations: Secondary | ICD-10-CM

## 2015-04-14 ENCOUNTER — Telehealth: Payer: Self-pay | Admitting: Family Medicine

## 2015-04-14 ENCOUNTER — Other Ambulatory Visit: Payer: Self-pay | Admitting: Internal Medicine

## 2015-04-14 MED ORDER — COLCHICINE 0.6 MG PO CAPS: 1.0000 | ORAL_CAPSULE | Freq: Two times a day (BID) | ORAL | Status: DC

## 2015-04-14 NOTE — Telephone Encounter (Signed)
Has she tried the Indomethacin?

## 2015-04-14 NOTE — Telephone Encounter (Signed)
Pt is aware as instructed 

## 2015-04-14 NOTE — Telephone Encounter (Signed)
Stop indomethacin, try colchicine (RX sent to pharmacy), if no improvement, follow up with PCP

## 2015-04-14 NOTE — Telephone Encounter (Signed)
Pt called she has a on going problem with gout on her left hand  Its not as bad as last time.  She wanted to know what she needed to do. [place]

## 2015-04-14 NOTE — Telephone Encounter (Signed)
Pt states she is currently taking that medication with little to no relief--please advise

## 2015-04-27 ENCOUNTER — Encounter: Payer: Self-pay | Admitting: Family Medicine

## 2015-04-27 ENCOUNTER — Ambulatory Visit (INDEPENDENT_AMBULATORY_CARE_PROVIDER_SITE_OTHER): Payer: Medicare Other | Admitting: Family Medicine

## 2015-04-27 VITALS — BP 138/82 | Temp 98.5°F | Resp 67 | Ht 62.5 in | Wt 194.8 lb

## 2015-04-27 DIAGNOSIS — M6588 Other synovitis and tenosynovitis, other site: Secondary | ICD-10-CM

## 2015-04-27 DIAGNOSIS — M67912 Unspecified disorder of synovium and tendon, left shoulder: Secondary | ICD-10-CM | POA: Diagnosis not present

## 2015-04-27 DIAGNOSIS — M659 Synovitis and tenosynovitis, unspecified: Secondary | ICD-10-CM

## 2015-04-27 NOTE — Progress Notes (Signed)
Pre visit review using our clinic review tool, if applicable. No additional management support is needed unless otherwise documented below in the visit note. 

## 2015-04-27 NOTE — Progress Notes (Signed)
Dr. Frederico Hamman T. Steph Cheadle, MD, [facility] [place], [address] Phone: [phone] Fax: [phone]  04/27/2015  Patient: Dana Greer, MRN: [id number], DOB: 1970-12-12, 44 y.o.  Primary Physician:  Loura Pardon, MD  Chief Complaint: Follow-up  Subjective:   Dana Greer is a 44 y.o. very pleasant female patient who presents with the following:  F/u R thumb and B shoulder. I remember this very pleasant patient quite well, and she is doing much better compared to my prior evaluation.  Her hand feels much better.  He does not fully back to baseline, but is dramatically improved.  She also is feeling quite a bit better with both of her shoulders.  She is still having some impingement.  Much better  04/06/2015 Last OV with Owens Loffler, MD  Right hand pain. Thumb, cannot bathe. This morning went to the bath at 1:30 AM.  Flexor tendon sheath, 1st This is been ongoing for approximately a month, and the patient does not have any specific injury or trauma that she can recall. 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  Her pain is primarily at the flexor sheath and she has pain with extension at the first digit on the right, and she also has some decreased strength.  Primarily this is with flexion.  Left shoulder. 1 month, l shoulder.  Fell on bottom, somehow moved it and hit the hand. She is having some pain with abduction, terminal internal range of motion, and she is not having any radicular symptoms.  She is not having any prior history of significant shoulder injuries or operations.   Past Medical History, Surgical History, Social History, Family History, Problem List, Medications, and Allergies have been reviewed and updated if relevant.  GEN: No fevers, chills. Nontoxic. Primarily MSK c/o today. MSK: Detailed in the HPI GI: tolerating PO intake  without difficulty Neuro: No numbness, parasthesias, or tingling associated. Otherwise the pertinent positives of the ROS are noted above.   Objective:   BP 138/82 mmHg  Temp(Src) 98.5 F (36.9 C) (Oral)  Resp 67  Ht 5' 2.5" (1.588 m)  Wt 194 lb 12.8 oz (88.361 kg)  BMI 35.04 kg/m2  SpO2 97%  GEN: WDWN, NAD, Non-toxic, Alert & Oriented x 3 HEENT: Atraumatic, Normocephalic.  Ears and Nose: No external deformity. EXTR: No clubbing/cyanosis/edema NEURO: Normal gait.  PSYCH: Normally interactive. Conversant. Not depressed or anxious appearing.  Calm demeanor.   Hand: R Ecchymosis or edema: neg ROM wrist/hand/digits/elbow: full Carpals, MCP's, digits: NT Distal Ulna and Radius: NT Supination lift test: neg Ecchymosis or edema: neg Cysts/nodules: neg Finkelstein's test: neg Snuffbox tenderness: neg Scaphoid tubercle: NT Hook of Hamate: NT Resisted supination: NT Full composite fist Grip, all digits: 5/5 str Axial load test: neg Phalen's: neg Tinel's: neg Atrophy: neg  Hand sensation: intact   Shoulder: B Inspection: No muscle wasting or winging Ecchymosis/edema: neg  AC joint, scapula, clavicle: NT Cervical spine: NT, full ROM Spurling's: neg Abduction: full, 5/5 Flexion: full, 5/5 IR, full, lift-off: 5/5 ER at neutral: full, 5/5 AC crossover: neg Neer: pos Hawkins: mild pos Drop Test: neg Empty Can: neg Supraspinatus insertion: nt Bicipital groove: NT Speed's: neg Yergason's: neg Sulcus sign: neg Scapular dyskinesis: none C5-T1 intact  Neuro: Sensation intact Grip 5/5   Radiology: No results found.  Assessment and Plan:   Flexor tenosynovitis of thumb  Tendinopathy of left rotator cuff   Doing much better on all fronts.  I gave her some workouts that she can do in the pool to work on her scapular stabilization and rotator cuff, which I think will likely help more than anything in a gentle manner.  Signed,  Maud Deed. Kyng Matlock,  MD   Patient's Medications  New Prescriptions   No medications on file  Previous Medications   ALPRAZOLAM (XANAX) 1 MG TABLET    Take 0.5 mg by mouth 2 (two) times daily as needed for anxiety.    COLCHICINE 0.6 MG CAPS    Take 1 tablet by mouth 2 (two) times daily.   CYCLOBENZAPRINE (FLEXERIL) 10 MG TABLET    Take 1/2 to1 pill up tid prn for headache or muscle pain   DICLOFENAC (VOLTAREN) 75 MG EC TABLET    Take 1 tablet (75 mg total) by mouth 2 (two) times daily.   ETONOGESTREL (IMPLANON) 68 MG IMPL IMPLANT    Inject 1 each into the skin continuous.   FLUTICASONE (FLONASE) 50 MCG/ACT NASAL SPRAY    Place 2 sprays into both nostrils daily.   GABAPENTIN (NEURONTIN) 100 MG CAPSULE    Take 1 capsule (100 mg total) by mouth 2 (two) times daily.   HYDROCODONE-ACETAMINOPHEN (NORCO/VICODIN) 5-325 MG PER TABLET    Take 1 tablet by mouth every 6 (six) hours as needed for moderate pain.   INDOMETHACIN (INDOCIN) 50 MG CAPSULE    Take 1 capsule (50 mg total) by mouth 3 (three) times daily with meals. As needed for gout pain   OMEPRAZOLE (PRILOSEC) 40 MG CAPSULE    Take 1 capsule (40 mg total) by mouth daily.   PAROXETINE (PAXIL) 40 MG TABLET    Take 1 tablet (40 mg total) by mouth daily.   PREDNISONE (DELTASONE) 20 MG TABLET    Take 3 tabs on days 1-2, 2 tabs on days 3-4, 1 tab on days 5-6   PROMETHAZINE (PHENERGAN) 25 MG TABLET    Take 1 tablet (25 mg total) by mouth every 8 (eight) hours as needed for nausea or vomiting.   TETRAHYDROZOLINE 0.05 % OPHTHALMIC SOLUTION    Place 1 drop into both eyes daily as needed (for dry eyes).   VALACYCLOVIR (VALTREX) 500 MG TABLET    Take 1 tablet (500 mg total) by mouth daily.  Modified Medications   No medications on file  Discontinued Medications   No medications on file

## 2015-04-28 ENCOUNTER — Encounter: Payer: Self-pay | Admitting: Internal Medicine

## 2015-04-28 ENCOUNTER — Ambulatory Visit (INDEPENDENT_AMBULATORY_CARE_PROVIDER_SITE_OTHER): Payer: Medicare Other | Admitting: Internal Medicine

## 2015-04-28 ENCOUNTER — Ambulatory Visit
Admission: RE | Admit: 2015-04-28 | Discharge: 2015-04-28 | Disposition: A | Discharge status: Home / Self Care | Payer: Medicare Other | Source: Ambulatory Visit | Attending: Internal Medicine | Admitting: Internal Medicine

## 2015-04-28 VITALS — BP 130/86 | HR 60 | Temp 98.9°F | Wt 210.0 lb

## 2015-04-28 DIAGNOSIS — R1012 Left upper quadrant pain: Secondary | ICD-10-CM | POA: Diagnosis not present

## 2015-04-28 DIAGNOSIS — K5730 Diverticulosis of large intestine without perforation or abscess without bleeding: Secondary | ICD-10-CM | POA: Insufficient documentation

## 2015-04-28 DIAGNOSIS — N8320 Unspecified ovarian cysts: Secondary | ICD-10-CM | POA: Insufficient documentation

## 2015-04-28 LAB — COMPREHENSIVE METABOLIC PANEL
ALT: 17 U/L (ref 0–35)
AST: 21 U/L (ref 0–37)
Albumin: 3.9 g/dL (ref 3.5–5.2)
Alkaline Phosphatase: 41 U/L (ref 39–117)
BUN: 13 mg/dL (ref 6–23)
CALCIUM: 9.4 mg/dL (ref 8.4–10.5)
CO2: 30 meq/L (ref 19–32)
CREATININE: 1.04 mg/dL (ref 0.40–1.20)
Chloride: 108 mEq/L (ref 96–112)
GFR: 73.83 mL/min (ref >60.00)
Glucose, Bld: 81 mg/dL (ref 70–99)
POTASSIUM: 3.9 meq/L (ref 3.5–5.1)
Sodium: 142 mEq/L (ref 135–145)
Total Bilirubin: 0.4 mg/dL (ref 0.2–1.2)
Total Protein: 6.9 g/dL (ref 6.0–8.3)

## 2015-04-28 LAB — CBC WITH DIFFERENTIAL/PLATELET
BASOS ABS: 0 10*3/uL (ref 0.0–0.1)
Basophils Relative: 0.3 % (ref 0.0–3.0)
Eosinophils Absolute: 0.1 10*3/uL (ref 0.0–0.7)
Eosinophils Relative: 2.5 % (ref 0.0–5.0)
HCT: 38.6 % (ref 36.0–46.0)
Hemoglobin: 13.2 g/dL (ref 12.0–15.0)
LYMPHS PCT: 36.2 % (ref 12.0–46.0)
Lymphs Abs: 2.1 10*3/uL (ref 0.7–4.0)
MCHC: 34.2 g/dL (ref 30.0–36.0)
MCV: 96.9 fl (ref 78.0–100.0)
Monocytes Absolute: 0.4 10*3/uL (ref 0.1–1.0)
Monocytes Relative: 6.9 % (ref 3.0–12.0)
Neutro Abs: 3.2 10*3/uL (ref 1.4–7.7)
Neutrophils Relative %: 54.1 % (ref 43.0–77.0)
PLATELETS: 138 10*3/uL — AB (ref 150.0–400.0)
RBC: 3.98 Mil/uL (ref 3.87–5.11)
RDW: 14.4 % (ref 11.5–15.5)
WBC: 5.8 10*3/uL (ref 4.0–10.5)

## 2015-04-28 LAB — AMYLASE: AMYLASE: 44 U/L (ref 27–131)

## 2015-04-28 LAB — LIPASE: LIPASE: 34 U/L (ref 11.0–59.0)

## 2015-04-28 MED ORDER — IOHEXOL 350 MG/ML SOLN
100.0000 mL | Freq: Once | INTRAVENOUS | Status: AC | PRN
  Administered 2015-04-28: 100 mL via INTRAVENOUS

## 2015-04-28 NOTE — Patient Instructions (Signed)

## 2015-04-28 NOTE — Progress Notes (Signed)
Pre visit review using our clinic review tool, if applicable. No additional management support is needed unless otherwise documented below in the visit note. 

## 2015-04-28 NOTE — Progress Notes (Signed)
Subjective:    Patient ID: Dana Greer, female    DOB: 1970/11/11, 44 y.o.   MRN: [id number]  HPI  Pt presents to the clinic today with c/o left side abdominal pain. She reports this started 3 months ago but it is progressively getting worse. She reports it is a sharp stabbing pain. The pain only last for a few seconds but when it happens, it makes her double over. She denies nausea or vomiting. She is having normal BM's, she denies blood in her stool. The pain is not worse with certain movements. She denies urinary of vaginal complaints. She has tried massage and Tylenol without any relief.  Review of Systems  Past Medical History  Diagnosis Date  . Arthritis   . Depression   . Headache(784.0)   . HSV-2 infection   . Thrombocytopenia 10/01/2012  . Immune deficiency disorder   . Stroke     Current Outpatient Prescriptions  Medication Sig Dispense Refill  . ALPRAZolam (XANAX) 1 MG tablet Take 0.5 mg by mouth 2 (two) times daily as needed for anxiety.     . Colchicine 0.6 MG CAPS Take 1 tablet by mouth 2 (two) times daily. 60 capsule 0  . cyclobenzaprine (FLEXERIL) 10 MG tablet Take 1/2 to1 pill up tid prn for headache or muscle pain 30 tablet 3  . diclofenac (VOLTAREN) 75 MG EC tablet Take 1 tablet (75 mg total) by mouth 2 (two) times daily. 50 tablet 0  . etonogestrel (IMPLANON) 68 MG IMPL implant Inject 1 each into the skin continuous.    . fluticasone (FLONASE) 50 MCG/ACT nasal spray Place 2 sprays into both nostrils daily. (Patient taking differently: Place 2 sprays into both nostrils 2 (two) times daily. ) 16 g 6  . gabapentin (NEURONTIN) 100 MG capsule Take 1 capsule (100 mg total) by mouth 2 (two) times daily. 60 capsule 3  . HYDROcodone-acetaminophen (NORCO/VICODIN) 5-325 MG per tablet Take 1 tablet by mouth every 6 (six) hours as needed for moderate pain. 20 tablet 0  . indomethacin (INDOCIN) 50 MG capsule Take 1 capsule (50 mg total) by mouth 3 (three) times daily with  meals. As needed for gout pain 30 capsule 3  . omeprazole (PRILOSEC) 40 MG capsule Take 1 capsule (40 mg total) by mouth daily. 14 capsule 0  . PARoxetine (PAXIL) 40 MG tablet Take 1 tablet (40 mg total) by mouth daily. 30 tablet 11  . predniSONE (DELTASONE) 20 MG tablet Take 3 tabs on days 1-2, 2 tabs on days 3-4, 1 tab on days 5-6 12 tablet 0  . promethazine (PHENERGAN) 25 MG tablet Take 1 tablet (25 mg total) by mouth every 8 (eight) hours as needed for nausea or vomiting. 30 tablet 0  . tetrahydrozoline 0.05 % ophthalmic solution Place 1 drop into both eyes daily as needed (for dry eyes).    . valACYclovir (VALTREX) 500 MG tablet Take 1 tablet (500 mg total) by mouth daily. 30 tablet 3   No current facility-administered medications for this visit.    Allergies  Allergen Reactions  . Trazodone And Nefazodone Nausea And Vomiting  . Amoxicillin Other (See Comments)    Yeast infection   . Carafate [Sucralfate] Nausea And Vomiting    Family History  Problem Relation Age of Onset  . Diabetes Father   . Cancer Paternal Aunt     breast  . Cancer Paternal Grandmother     colon CA    History   Social History  .  Marital Status: Legally Separated    Spouse Name: N/A  . Number of Children: 3  . Years of Education: N/A   Occupational History  . Collections, [employer]    Social History Main Topics  . Smoking status: Never Smoker   . Smokeless tobacco: Never Used     Comment: Pt states she smokes.....but "not cigarettes"  . Alcohol Use: 1.2 oz/week    2 Glasses of wine per week     Comment: occ  . Drug Use: Yes    Special: Marijuana  . Sexual Activity: Yes    Birth Control/ Protection: None   Other Topics Concern  . Not on file   Social History Narrative     Constitutional: Denies fever, malaise, fatigue, headache or abrupt weight changes.  Respiratory: Denies difficulty breathing, shortness of breath, cough or sputum production.   Cardiovascular: Denies chest pain, chest  tightness, palpitations or swelling in the hands or feet.  Gastrointestinal: Pt reports abdominal pain. Denies bloating, constipation, diarrhea or blood in the stool.  GU: Denies urgency, frequency, pain with urination, burning sensation, blood in urine, odor or discharge. Musculoskeletal: Denies decrease in range of motion, difficulty with gait, muscle pain or joint pain and swelling.    No other specific complaints in a complete review of systems (except as listed in HPI above).     Objective:   Physical Exam   BP 130/86 mmHg  Pulse 60  Temp(Src) 98.9 F (37.2 C) (Oral)  Wt 210 lb (95.255 kg)  SpO2 99% Wt Readings from Last 3 Encounters:  04/28/15 210 lb (95.255 kg)  04/27/15 194 lb 12.8 oz (88.361 kg)  04/03/15 209 lb (94.802 kg)    General: Appears her stated age, obese in NAD. Skin: Warm, dry and intact. No rashes, lesions or ulcerations noted. Cardiovascular: Normal rate and rhythm. S1,S2 noted.  No murmur, rubs or gallops noted. Pulmonary/Chest: Normal effort and positive vesicular breath sounds. No respiratory distress. No wheezes, rales or ronchi noted.  Abdomen: Soft and tender in the LUQ. Normal bowel sounds, no bruits noted. No distention or masses noted. Liver, spleen and kidneys non palpable. Neurological: Alert and oriented.   BMET    Component Value Date/Time   NA 140 11/29/2014 1145   K 3.5 11/29/2014 1145   CL 106 11/29/2014 1145   CO2 23 11/29/2014 1145   GLUCOSE 135* 11/29/2014 1145   BUN 10 11/29/2014 1145   CREATININE 1.01 11/29/2014 1145   CALCIUM 10.0 11/29/2014 1145   GFRNONAA 67* 11/29/2014 1145   GFRAA 77* 11/29/2014 1145    Lipid Panel     Component Value Date/Time   CHOL 100 07/25/2007 0852   TRIG 31 07/25/2007 0852   HDL 39.5 07/25/2007 0852   CHOLHDL 2.5 CALC 07/25/2007 0852   VLDL 6 07/25/2007 0852   LDLCALC 54 07/25/2007 0852    CBC    Component Value Date/Time   WBC 8.6 11/29/2014 1145   WBC 5.2 06/07/2013 0852   RBC  4.79 11/29/2014 1145   RBC 4.16 06/07/2013 0852   HGB 15.7* 11/29/2014 1145   HGB 13.3 06/07/2013 0852   HCT 44.4 11/29/2014 1145   HCT 39.7 06/07/2013 0852   HCT 40.7 11/15/2011 0953   PLT 146* 11/29/2014 1145   PLT 146 06/07/2013 0852   MCV 92.7 11/29/2014 1145   MCV 95.4 06/07/2013 0852   MCH 32.8 11/29/2014 1145   MCH 32.0 06/07/2013 0852   MCHC 35.4 11/29/2014 1145   MCHC 33.5 06/07/2013  4098   RDW 13.5 11/29/2014 1145   RDW 13.5 06/07/2013 0852   LYMPHSABS 0.9 11/29/2014 1145   LYMPHSABS 1.9 06/07/2013 0852   MONOABS 0.2 11/29/2014 1145   MONOABS 0.4 06/07/2013 0852   EOSABS 0.0 11/29/2014 1145   EOSABS 0.2 06/07/2013 0852   BASOSABS 0.0 11/29/2014 1145   BASOSABS 0.0 06/07/2013 0852    Hgb A1C Lab Results  Component Value Date   HGBA1C 5.7 07/25/2007        Assessment & Plan:   LUQ pain:  She seems to think it is a muscle cramp, I think it is intrabdominal She is already on Prilosec daily, continue for now Will check CBC, CMET, Lipase and Amylase Will obtain CT scan abdomen today Will follow up with you after labs and CT Scan  If pain persist or worsen, go straight to ER

## 2015-04-29 ENCOUNTER — Other Ambulatory Visit: Payer: Self-pay | Admitting: Internal Medicine

## 2015-04-29 DIAGNOSIS — R1012 Left upper quadrant pain: Secondary | ICD-10-CM

## 2015-04-30 ENCOUNTER — Encounter: Payer: Self-pay | Admitting: Physician Assistant

## 2015-04-30 DIAGNOSIS — N3941 Urge incontinence: Secondary | ICD-10-CM | POA: Diagnosis not present

## 2015-04-30 DIAGNOSIS — R312 Other microscopic hematuria: Secondary | ICD-10-CM | POA: Diagnosis not present

## 2015-05-14 ENCOUNTER — Encounter: Payer: Self-pay | Admitting: Physician Assistant

## 2015-05-14 ENCOUNTER — Ambulatory Visit (INDEPENDENT_AMBULATORY_CARE_PROVIDER_SITE_OTHER): Payer: Medicare Other | Admitting: Physician Assistant

## 2015-05-14 VITALS — BP 158/98 | HR 80 | Ht 62.5 in | Wt 211.0 lb

## 2015-05-14 DIAGNOSIS — K589 Irritable bowel syndrome without diarrhea: Secondary | ICD-10-CM

## 2015-05-14 DIAGNOSIS — K219 Gastro-esophageal reflux disease without esophagitis: Secondary | ICD-10-CM | POA: Diagnosis not present

## 2015-05-14 DIAGNOSIS — R1013 Epigastric pain: Secondary | ICD-10-CM

## 2015-05-14 DIAGNOSIS — R110 Nausea: Secondary | ICD-10-CM

## 2015-05-14 MED ORDER — DICYCLOMINE HCL 10 MG PO CAPS: 10.0000 mg | ORAL_CAPSULE | Freq: Two times a day (BID) | ORAL | Status: DC

## 2015-05-14 MED ORDER — PANTOPRAZOLE SODIUM 40 MG PO TBEC: DELAYED_RELEASE_TABLET | ORAL | Status: DC

## 2015-05-14 NOTE — Progress Notes (Signed)
Patient ID: Dana Greer, female   DOB: 11-28-1970, 44 y.o.   MRN: [id number]    HPI:  Dana Greer is a 44 y.o.   female referred by Dana Fenton, NP for evaluation of abdominal pain. [name] has a history of migraines, goiter, polycystic ovarian disease, eustachian tube dysfunction, carpal tunnel syndrome, hirsutism, hyperhidrosis, osteoarthritis, sacroiliac dysfunction, adjustment disorder with anxiety and depressed mood, chronic low back pain, hyperglycemia immune deficiency disorder, stroke,, and gout. She reports that she has had epigastric discomfort and nausea for years. She states she is constantly nauseous. Her nausea is worse on an empty stomach, temporary Lee alleviated with ingestion of food, and then it comes back. She has a history of ulcers which she says were diagnosed in [place] when she was in high school. She never had an EGD but states she had an upper GI while in high school. She was treated with Zantac which provided relief for a while and then she discontinued it. Over the past few years she has had frequent epigastric discomfort and nausea and has been on Nexium and Prilosec intermittently. She states every time she goes to the emergency room for headaches or joint pains she is given a PPI and told to follow up with GI. Recently she took Prilosec for 3 months and discontinued it 2 months ago. She feels that it helped her reflux but she is not sure if it helped her nausea. She feels she has an intense hunger pain that wakes her up at night causing her to get up and have something to eat. She has never been tested for H. pylori. She has no dysphasia or odynophagia. More recently she has complained of left upper quadrant pain of 2 months duration. The pain is intermittent but comes on a daily basis. It feels like a stitch in her side and will last from 2-10 minutes and then go away. Her bowel movements for the most part are regular but she feels every food makes  her sick. She reports that she eats, gets crampy, sometimes has her left upper quadrant pain, and then has a bowel movement which alleviates the discomfort. She was recently started on gabapentin and she feels it hurts her stomach. She states that over the past week her stools have been darker than normal. She has not been using Pepto-Bismol. She has had no bright red blood per rectum. There is no known family history of colon cancer, colon polyps, or inflammatory bowel disease.   Past Medical History  Diagnosis Date  . Arthritis   . Depression   . Headache(784.0)   . HSV-2 infection   . Thrombocytopenia 10/01/2012  . Immune deficiency disorder   . Stroke     Past Surgical History  Procedure Laterality Date  . Partial hysterectomy  2012    due to fibroid  . Wisdom tooth extraction  1995  . Cesarean section  2008    x1  . Abdominal hysterectomy     Family History  Problem Relation Age of Onset  . Diabetes Father   . Cancer Paternal Aunt     breast  . Cancer Paternal Grandmother     colon CA   History  Substance Use Topics  . Smoking status: Never Smoker   . Smokeless tobacco: Never Used     Comment: Pt states she smokes.....but "not cigarettes"  . Alcohol Use: 1.2 oz/week    2 Glasses of wine per week  Comment: occ   Current Outpatient Prescriptions  Medication Sig Dispense Refill  . ALPRAZolam (XANAX) 1 MG tablet Take 0.5 mg by mouth 2 (two) times daily as needed for anxiety.     . Colchicine 0.6 MG CAPS Take 1 tablet by mouth 2 (two) times daily. 60 capsule 0  . cyclobenzaprine (FLEXERIL) 10 MG tablet Take 1/2 to1 pill up tid prn for headache or muscle pain 30 tablet 3  . diclofenac (VOLTAREN) 75 MG EC tablet Take 1 tablet (75 mg total) by mouth 2 (two) times daily. 50 tablet 0  . etonogestrel (IMPLANON) 68 MG IMPL implant Inject 1 each into the skin continuous.    . fluticasone (FLONASE) 50 MCG/ACT nasal spray Place 2 sprays into both nostrils daily. (Patient taking  differently: Place 2 sprays into both nostrils 2 (two) times daily. ) 16 g 6  . gabapentin (NEURONTIN) 100 MG capsule Take 1 capsule (100 mg total) by mouth 2 (two) times daily. 60 capsule 3  . HYDROcodone-acetaminophen (NORCO/VICODIN) 5-325 MG per tablet Take 1 tablet by mouth every 6 (six) hours as needed for moderate pain. 20 tablet 0  . Mirabegron (MYRBETRIQ PO) Take by mouth daily.    Marland Kitchen omeprazole (PRILOSEC) 40 MG capsule Take 1 capsule (40 mg total) by mouth daily. 14 capsule 0  . PARoxetine (PAXIL) 40 MG tablet Take 1 tablet (40 mg total) by mouth daily. 30 tablet 11  . promethazine (PHENERGAN) 25 MG tablet Take 1 tablet (25 mg total) by mouth every 8 (eight) hours as needed for nausea or vomiting. 30 tablet 0  . tetrahydrozoline 0.05 % ophthalmic solution Place 1 drop into both eyes daily as needed (for dry eyes).    . valACYclovir (VALTREX) 500 MG tablet Take 1 tablet (500 mg total) by mouth daily. 30 tablet 3  . dicyclomine (BENTYL) 10 MG capsule Take 1 capsule (10 mg total) by mouth 2 (two) times daily. 60 capsule 2  . indomethacin (INDOCIN) 50 MG capsule Take 1 capsule (50 mg total) by mouth 3 (three) times daily with meals. As needed for gout pain (Patient not taking: Reported on 05/14/2015) 30 capsule 3  . pantoprazole (PROTONIX) 40 MG tablet Take 30 minutes before breakfast 30 tablet 3   No current facility-administered medications for this visit.   Allergies  Allergen Reactions  . Trazodone And Nefazodone Nausea And Vomiting  . Amoxicillin Other (See Comments)    Yeast infection   . Carafate [Sucralfate] Nausea And Vomiting     Review of Systems: Gen: Denies any fever, chills, sweats, anorexia, fatigue, weakness, malaise, weight loss, and sleep disorder CV: Denies chest pain, angina, palpitations, syncope, orthopnea, PND, peripheral edema, and claudication. Resp: Denies dyspnea at rest, dyspnea with exercise, cough, sputum, wheezing, coughing up blood, and pleurisy. GI:  Denies vomiting blood, jaundice, and fecal incontinence.   Denies dysphagia or odynophagia. GU : Denies urinary burning, blood in urine, urinary frequency, urinary hesitancy, nocturnal urination, and urinary incontinence. MS: Denies joint pain, limitation of movement, and swelling, stiffness, low back pain, extremity pain. Denies muscle weakness, cramps, atrophy.  Derm: Denies rash, itching, dry skin, hives, moles, warts, or unhealing ulcers.  Psych: Denies depression, anxiety, memory loss, suicidal ideation, hallucinations, paranoia, and confusion. Heme: Denies bruising, bleeding, and enlarged lymph nodes. Neuro:  Denies any headaches, dizziness, paresthesias. Endo:  Denies any problems with DM, thyroid, adrenal function  Studies: Ct Abdomen Pelvis W Contrast  04/28/2015   CLINICAL DATA:  Left upper quadrant abdominal pain, severe  and worsening.  EXAM: CT ABDOMEN AND PELVIS WITH CONTRAST  TECHNIQUE: Multidetector CT imaging of the abdomen and pelvis was performed using the standard protocol following bolus administration of intravenous contrast.  CONTRAST:  170mL OMNIPAQUE IOHEXOL 350 MG/ML SOLN  COMPARISON:  11/29/2014  FINDINGS: Lower chest:  Lung bases are clear.  Heart size is normal.  Hepatobiliary: No biliary ductal dilatation or focal hepatic lesion. Gallbladder is folded but otherwise unremarkable.  Pancreas: No acute abnormality or pancreatic duct dilation.  Spleen:  No focal abnormality.  Normal size.  Adrenals/Urinary Tract: No focal renal or adrenal abnormality. No hydronephrosis. Urinary bladder is unremarkable.  Stomach/Bowel: Stomach, large ands mall bowel grossly unremarkable, apart from few scattered diverticula along the left colon. No evidence of diverticulitis.  Vascular/Lymphatic: No retroperitoneal or mesenteric adenopathy. Aorta is normal in caliber.  Reproductive: There has been a prior partial hysterectomy. The ovaries are normal in appearance. A 19 mm low-density circumscribed  structure on the left ovary likely represents a physiologic cyst.  Other: Small periumbilical fat containing hernia is unchanged.  Musculoskeletal: No suspicious focal bone lesion or acute bony abnormality. S-shaped spinal scoliosis is noted. Posterior facet arthropathy is noted in the lower lumbosacral spine. There is prominent bilateral osteitis condensans ilia.  IMPRESSION: No findings to explain patient's left upper quadrant abdominal pain.  Minimal left colonic diverticulosis.  Left ovarian cyst, likely physiologic given its size.   Electronically Signed   By: Fidela Salisbury M.D.   On: 04/28/2015 14:05    LAB RESULTS: CBC 04/28/2015 white count 5.8, hemoglobin 13.2, hematocrit 38.6, platelets 138,000. Comprehensive metabolic panel 53/66/4403 total bili 0.4 alkaline phosphatase 41, AST 21, ALT 17, albumin 3.9.   Physical Exam: BP 158/98 mmHg  Pulse 80  Ht 5' 2.5" (1.588 m)  Wt 211 lb (95.709 kg)  BMI 37.95 kg/m2  LMP  Constitutional: Pleasant,well-developed African-American female in no acute distress. HEENT: Normocephalic and atraumatic. Conjunctivae are normal. No scleral icterus. Neck supple. No thyromegaly Cardiovascular: Normal rate, regular rhythm.  Pulmonary/chest: Effort normal and breath sounds normal. No wheezing, rales or rhonchi. Abdominal: Soft, nondistended, nontender. Bowel sounds active throughout. There are no masses palpable. No hepatomegaly. Rectal: Light brown stool heme negative Extremities: no edema Lymphadenopathy: No cervical adenopathy noted. Neurological: Alert and oriented to person place and time. Skin: Skin is warm and dry. No rashes noted. Psychiatric: Normal mood and affect. Behavior is normal.  ASSESSMENT AND PLAN: #1. Epigastric pain and nausea. Patient states the symptoms have been going on since high school. She reports that she was diagnosed with ulcers in high school and has been on various medications through the years. An antireflux regimen  has been reviewed. She will be given a trial of pantoprazole 40 mg 1 by mouth every morning 30 minutes prior to breakfast. She will be scheduled for an EGD to evaluate for esophagitis, gastritis, ulcers, duodenitis etc.The risks, benefits, and alternatives to endoscopy with possible biopsy and possible dilation were discussed with the patient and they consent to proceed. The procedure will be scheduled with Dr. Carlean Purl.  #2. Postprandial cramping and pain. Patient states the symptoms have been going on for years. Symptoms are likely functional in nature/IBS. Patient is been instructed to adhere to a high-fiber low-fat diet. She will be given a trial of Bentyl 10 mg 1 by mouth twice a day.  Follow-up recommendations will be made pending the findings of the above.    Hayward Rylander, Vita Barley PA-C 05/14/2015, 12:06 PM  CC: Webb Silversmith  W, NP

## 2015-05-14 NOTE — Patient Instructions (Signed)
You have been scheduled for an endoscopy. Please follow written instructions given to you at your visit today. If you use inhalers (even only as needed), please bring them with you on the day of your procedure.  We have sent the following medications to your pharmacy for you to pick up at your convenience:  Pantoprazole 40mg , take 30 minutes prior to breakfast  Bentyl 10mg , take 1 twice daily.

## 2015-05-15 ENCOUNTER — Ambulatory Visit (AMBULATORY_SURGERY_CENTER): Payer: Medicare Other | Admitting: Internal Medicine

## 2015-05-15 ENCOUNTER — Encounter: Payer: Self-pay | Admitting: Internal Medicine

## 2015-05-15 VITALS — BP 147/94 | HR 67 | Temp 97.5°F | Resp 16 | Ht 62.0 in | Wt 211.0 lb

## 2015-05-15 DIAGNOSIS — R1013 Epigastric pain: Secondary | ICD-10-CM

## 2015-05-15 DIAGNOSIS — K2970 Gastritis, unspecified, without bleeding: Secondary | ICD-10-CM | POA: Diagnosis not present

## 2015-05-15 DIAGNOSIS — K2990 Gastroduodenitis, unspecified, without bleeding: Secondary | ICD-10-CM

## 2015-05-15 DIAGNOSIS — E669 Obesity, unspecified: Secondary | ICD-10-CM | POA: Diagnosis not present

## 2015-05-15 MED ORDER — SODIUM CHLORIDE 0.9 % IV SOLN: 500.0000 mL | INTRAVENOUS | Status: DC

## 2015-05-15 NOTE — Progress Notes (Signed)
Called to room to assist during endoscopic procedure.  Patient ID and intended procedure confirmed with present staff. Received instructions for my participation in the procedure from the performing physician.  

## 2015-05-15 NOTE — Progress Notes (Signed)
Agree w/ Ms. Hvozdovic's note and mangement.  

## 2015-05-15 NOTE — Progress Notes (Signed)
To recovery, report to [place], RN, VSS 

## 2015-05-15 NOTE — Op Note (Signed)
[facility]  [address] [place], [id number]   ENDOSCOPY PROCEDURE REPORT  PATIENT: Dana, Greer  MR#: [id number] BIRTHDATE: May 16, 1971 , 89  yrs. old GENDER: female ENDOSCOPIST: Gatha Mayer, MD, [facility] PROCEDURE DATE:  05/15/2015 PROCEDURE:  EGD w/ biopsy ASA CLASS:     Class II INDICATIONS:  epigastric pain. MEDICATIONS: Propofol 200 mg IV and Monitored anesthesia care TOPICAL ANESTHETIC: none  DESCRIPTION OF PROCEDURE: After the risks benefits and alternatives of the procedure were thoroughly explained, informed consent was obtained.  The LB [id number] endoscope was introduced through the mouth and advanced to the second portion of the duodenum , Without limitations.  The instrument was slowly withdrawn as the mucosa was fully examined.    1) Mild antral gastritis - red spots and mottled mucosa - biopsied. 2) Otherwise normal EGD.  Retroflexed views revealed no abnormalities.     The scope was then withdrawn from the patient and the procedure completed.  COMPLICATIONS: There were no immediate complications.  ENDOSCOPIC IMPRESSION: 1) Mild antral gastritis - red spots and mottled mucosa - biopsied. 2) Otherwise normal EGD  RECOMMENDATIONS: 1.  Office will call with results 2.  Start pantoprazole and dicyclomine rxed yesteerday   eSigned:  Gatha Mayer, MD, [facility] 05/15/2015 10:21 AM    AJ:HHIDU [name], MD and The Patient

## 2015-05-15 NOTE — Patient Instructions (Addendum)
No ulcers but I did see mild inflammation in the stomach = gastritis. Biopsies were taken to evaluate this.  Please start the medications Cecille Rubin prescribed and we will call with results and plans.  I appreciate the opportunity to care for you. Gatha Mayer, MD, FACG YOU HAD AN ENDOSCOPIC PROCEDURE TODAY AT [facility]:   Refer to the procedure report that was given to you for any specific questions about what was found during the examination.  If the procedure report does not answer your questions, please call your gastroenterologist to clarify.  If you requested that your care partner not be given the details of your procedure findings, then the procedure report has been included in a sealed envelope for you to review at your convenience later.  YOU SHOULD EXPECT: Some feelings of bloating in the abdomen. Passage of more gas than usual.  Walking can help get rid of the air that was put into your GI tract during the procedure and reduce the bloating. If you had a lower endoscopy (such as a colonoscopy or flexible sigmoidoscopy) you may notice spotting of blood in your stool or on the toilet paper. If you underwent a bowel prep for your procedure, you may not have a normal bowel movement for a few days.  Please Note:  You might notice some irritation and congestion in your nose or some drainage.  This is from the oxygen used during your procedure.  There is no need for concern and it should clear up in a day or so.  SYMPTOMS TO REPORT IMMEDIATELY:   Following upper endoscopy (EGD)  Vomiting of blood or coffee ground material  New chest pain or pain under the shoulder blades  Painful or persistently difficult swallowing  New shortness of breath  Fever of 100F or higher  Black, tarry-looking stools  For urgent or emergent issues, a gastroenterologist can be reached at any hour by calling [phone].   DIET: Your first meal following the procedure should be a  small meal and then it is ok to progress to your normal diet. Heavy or fried foods are harder to digest and may make you feel nauseous or bloated.  Likewise, meals heavy in dairy and vegetables can increase bloating.  Drink plenty of fluids but you should avoid alcoholic beverages for 24 hours.  ACTIVITY:  You should plan to take it easy for the rest of today and you should NOT DRIVE or use heavy machinery until tomorrow (because of the sedation medicines used during the test).    FOLLOW UP: Our staff will call the number listed on your records the next business day following your procedure to check on you and address any questions or concerns that you may have regarding the information given to you following your procedure. If we do not reach you, we will leave a message.  However, if you are feeling well and you are not experiencing any problems, there is no need to return our call.  We will assume that you have returned to your regular daily activities without incident.  If any biopsies were taken you will be contacted by phone or by letter within the next 1-3 weeks.  Please call us at [phone] if you have not heard about the biopsies in 3 weeks.    SIGNATURES/CONFIDENTIALITY: You and/or your care partner have signed paperwork which will be entered into your electronic medical record.  These signatures attest to the fact that that the information  above on your After Visit Summary has been reviewed and is understood.  Full responsibility of the confidentiality of this discharge information lies with you and/or your care-partner. 

## 2015-05-18 ENCOUNTER — Telehealth: Payer: Self-pay | Admitting: *Deleted

## 2015-05-18 NOTE — Telephone Encounter (Signed)
  Follow up Call-  Call back number 05/15/2015  Post procedure Call Back phone  # [phone]  Permission to leave phone message Yes     Patient questions:  Do you have a fever, pain , or abdominal swelling? No. Pain Score  0 *  Have you tolerated food without any problems? Yes.    Have you been able to return to your normal activities? Yes.    Do you have any questions about your discharge instructions: Diet   No. Medications  No. Follow up visit  No.  Do you have questions or concerns about your Care? No.  Actions: * If pain score is 4 or above: No action needed, pain <4.  Pt states that she has been doing fine except she woke up last night and vomited.  She states that it was clear without blood or dark material.  She also states that this is normal for her.  I reminded her that biopsies were taken and she will get those results in 1-3 weeks.  She will call back with any problems.

## 2015-05-19 DIAGNOSIS — R3915 Urgency of urination: Secondary | ICD-10-CM | POA: Diagnosis not present

## 2015-05-19 DIAGNOSIS — R312 Other microscopic hematuria: Secondary | ICD-10-CM | POA: Diagnosis not present

## 2015-05-21 ENCOUNTER — Encounter (HOSPITAL_COMMUNITY): Payer: Self-pay | Admitting: Emergency Medicine

## 2015-05-21 ENCOUNTER — Emergency Department (HOSPITAL_COMMUNITY)
Admission: EM | Admit: 2015-05-21 | Discharge: 2015-05-21 | Disposition: A | Discharge status: Home / Self Care | Payer: Medicare Other | Attending: Emergency Medicine | Admitting: Emergency Medicine

## 2015-05-21 DIAGNOSIS — R1084 Generalized abdominal pain: Secondary | ICD-10-CM | POA: Diagnosis not present

## 2015-05-21 DIAGNOSIS — M1990 Unspecified osteoarthritis, unspecified site: Secondary | ICD-10-CM | POA: Diagnosis not present

## 2015-05-21 DIAGNOSIS — F329 Major depressive disorder, single episode, unspecified: Secondary | ICD-10-CM | POA: Insufficient documentation

## 2015-05-21 DIAGNOSIS — Z79899 Other long term (current) drug therapy: Secondary | ICD-10-CM | POA: Diagnosis not present

## 2015-05-21 DIAGNOSIS — Z862 Personal history of diseases of the blood and blood-forming organs and certain disorders involving the immune mechanism: Secondary | ICD-10-CM | POA: Insufficient documentation

## 2015-05-21 DIAGNOSIS — R112 Nausea with vomiting, unspecified: Secondary | ICD-10-CM

## 2015-05-21 DIAGNOSIS — Z8673 Personal history of transient ischemic attack (TIA), and cerebral infarction without residual deficits: Secondary | ICD-10-CM | POA: Insufficient documentation

## 2015-05-21 DIAGNOSIS — Z880 Allergy status to penicillin: Secondary | ICD-10-CM | POA: Insufficient documentation

## 2015-05-21 DIAGNOSIS — Z8619 Personal history of other infectious and parasitic diseases: Secondary | ICD-10-CM | POA: Diagnosis not present

## 2015-05-21 DIAGNOSIS — R6883 Chills (without fever): Secondary | ICD-10-CM | POA: Diagnosis not present

## 2015-05-21 DIAGNOSIS — R1012 Left upper quadrant pain: Secondary | ICD-10-CM | POA: Diagnosis present

## 2015-05-21 LAB — URINALYSIS, ROUTINE W REFLEX MICROSCOPIC
Bilirubin Urine: NEGATIVE
Glucose, UA: NEGATIVE mg/dL
KETONES UR: 15 mg/dL — AB
Leukocytes, UA: NEGATIVE
Nitrite: NEGATIVE
PROTEIN: NEGATIVE mg/dL
SPECIFIC GRAVITY, URINE: 1.023 (ref 1.005–1.030)
UROBILINOGEN UA: 1 mg/dL (ref 0.0–1.0)
pH: 7 (ref 5.0–8.0)

## 2015-05-21 LAB — COMPREHENSIVE METABOLIC PANEL
ALT: 16 U/L (ref 14–54)
AST: 22 U/L (ref 15–41)
Albumin: 4.5 g/dL (ref 3.5–5.0)
Alkaline Phosphatase: 45 U/L (ref 38–126)
Anion gap: 8 (ref 5–15)
BUN: 14 mg/dL (ref 6–20)
CHLORIDE: 108 mmol/L (ref 101–111)
CO2: 25 mmol/L (ref 22–32)
Calcium: 9.2 mg/dL (ref 8.9–10.3)
Creatinine, Ser: 1.11 mg/dL — ABNORMAL HIGH (ref 0.44–1.00)
GFR calc Af Amer: >60 mL/min (ref >60)
GFR calc non Af Amer: 59 mL/min — ABNORMAL LOW (ref >60)
GLUCOSE: 127 mg/dL — AB (ref 65–99)
POTASSIUM: 3.8 mmol/L (ref 3.5–5.1)
SODIUM: 141 mmol/L (ref 135–145)
Total Bilirubin: 0.6 mg/dL (ref 0.3–1.2)
Total Protein: 7.6 g/dL (ref 6.5–8.1)

## 2015-05-21 LAB — CBC
HCT: 40.8 % (ref 36.0–46.0)
HEMOGLOBIN: 13.9 g/dL (ref 12.0–15.0)
MCH: 32.6 pg (ref 26.0–34.0)
MCHC: 34.1 g/dL (ref 30.0–36.0)
MCV: 95.8 fL (ref 78.0–100.0)
Platelets: 145 10*3/uL — ABNORMAL LOW (ref 150–400)
RBC: 4.26 MIL/uL (ref 3.87–5.11)
RDW: 13.7 % (ref 11.5–15.5)
WBC: 7.6 10*3/uL (ref 4.0–10.5)

## 2015-05-21 LAB — URINE MICROSCOPIC-ADD ON

## 2015-05-21 LAB — LIPASE, BLOOD: Lipase: 18 U/L — ABNORMAL LOW (ref 22–51)

## 2015-05-21 MED ORDER — SODIUM CHLORIDE 0.9 % IV BOLUS (SEPSIS)
1000.0000 mL | Freq: Once | INTRAVENOUS | Status: AC
  Administered 2015-05-21: 1000 mL via INTRAVENOUS

## 2015-05-21 MED ORDER — PANTOPRAZOLE SODIUM 40 MG IV SOLR
40.0000 mg | INTRAVENOUS | Status: AC
  Administered 2015-05-21: 40 mg via INTRAVENOUS
  Filled 2015-05-21: qty 40

## 2015-05-21 MED ORDER — KETOROLAC TROMETHAMINE 30 MG/ML IJ SOLN
30.0000 mg | Freq: Once | INTRAMUSCULAR | Status: AC
  Administered 2015-05-21: 30 mg via INTRAVENOUS
  Filled 2015-05-21: qty 1

## 2015-05-21 MED ORDER — ONDANSETRON 4 MG PO TBDP
4.0000 mg | ORAL_TABLET | Freq: Once | ORAL | Status: AC | PRN
  Administered 2015-05-21: 4 mg via ORAL
  Filled 2015-05-21: qty 1

## 2015-05-21 MED ORDER — METOCLOPRAMIDE HCL 5 MG/ML IJ SOLN
10.0000 mg | Freq: Once | INTRAMUSCULAR | Status: AC
  Administered 2015-05-21: 10 mg via INTRAVENOUS
  Filled 2015-05-21: qty 2

## 2015-05-21 NOTE — ED Notes (Signed)
Pt from home c/o generalized abdominal pain, vomiting, and weakness. She reports that she has been vomiting violently nonstop since midnight. She reports that she saw [facility] yesterday and was diagnosed with a UTI and was told to pick up a prescription Thursday.

## 2015-05-21 NOTE — Discharge Instructions (Signed)
Abdominal Pain °Many things can cause abdominal pain. Usually, abdominal pain is not caused by a disease and will improve without treatment. It can often be observed and treated at home. Your health care provider will do a physical exam and possibly order blood tests and X-rays to help determine the seriousness of your pain. However, in many cases, more time must pass before a clear cause of the pain can be found. Before that point, your health care provider may not know if you need more testing or further treatment. °HOME CARE INSTRUCTIONS  °Monitor your abdominal pain for any changes. The following actions may help to alleviate any discomfort you are experiencing: °· Only take over-the-counter or prescription medicines as directed by your health care provider. °· Do not take laxatives unless directed to do so by your health care provider. °· Try a clear liquid diet (broth, tea, or water) as directed by your health care provider. Slowly move to a bland diet as tolerated. °SEEK MEDICAL CARE IF: °· You have unexplained abdominal pain. °· You have abdominal pain associated with nausea or diarrhea. °· You have pain when you urinate or have a bowel movement. °· You experience abdominal pain that wakes you in the night. °· You have abdominal pain that is worsened or improved by eating food. °· You have abdominal pain that is worsened with eating fatty foods. °· You have a fever. °SEEK IMMEDIATE MEDICAL CARE IF:  °· Your pain does not go away within 2 hours. °· You keep throwing up (vomiting). °· Your pain is felt only in portions of the abdomen, such as the right side or the left lower portion of the abdomen. °· You pass bloody or black tarry stools. °MAKE SURE YOU: °· Understand these instructions.   °· Will watch your condition.   °· Will get help right away if you are not doing well or get worse.   °Document Released: 07/27/2005 Document Revised: 10/22/2013 Document Reviewed: 06/26/2013 °Exit[employer]® Patient Information  ©2015 [employer]. This information is not intended to replace advice given to you by your health care provider. Make sure you discuss any questions you have with your health care provider. ° °Nausea and Vomiting °Nausea is a sick feeling that often comes before throwing up (vomiting). Vomiting is a reflex where stomach contents come out of your mouth. Vomiting can cause severe loss of body fluids (dehydration). Children and elderly adults can become dehydrated quickly, especially if they also have diarrhea. Nausea and vomiting are symptoms of a condition or disease. It is important to find the cause of your symptoms. °CAUSES  °· Direct irritation of the stomach lining. This irritation can result from increased acid production (gastroesophageal reflux disease), infection, food poisoning, taking certain medicines (such as nonsteroidal anti-inflammatory drugs), alcohol use, or tobacco use. °· Signals from the brain. These signals could be caused by a headache, heat exposure, an inner ear disturbance, increased pressure in the brain from injury, infection, a tumor, or a concussion, pain, emotional stimulus, or metabolic problems. °· An obstruction in the gastrointestinal tract (bowel obstruction). °· Illnesses such as diabetes, hepatitis, gallbladder problems, appendicitis, kidney problems, cancer, sepsis, atypical symptoms of a heart attack, or eating disorders. °· Medical treatments such as chemotherapy and radiation. °· Receiving medicine that makes you sleep (general anesthetic) during surgery. °DIAGNOSIS °Your caregiver may ask for tests to be done if the problems do not improve after a few days. Tests may also be done if symptoms are severe or if the reason for the nausea   and vomiting is not clear. Tests may include: °· Urine tests. °· Blood tests. °· Stool tests. °· Cultures (to look for evidence of infection). °· X-rays or other imaging studies. °Test results can help your caregiver make decisions about  treatment or the need for additional tests. °TREATMENT °You need to stay well hydrated. Drink frequently but in small amounts. You may wish to drink water, sports drinks, clear broth, or eat frozen ice pops or gelatin dessert to help stay hydrated. When you eat, eating slowly may help prevent nausea. There are also some antinausea medicines that may help prevent nausea. °HOME CARE INSTRUCTIONS  °· Take all medicine as directed by your caregiver. °· If you do not have an appetite, do not force yourself to eat. However, you must continue to drink fluids. °· If you have an appetite, eat a normal diet unless your caregiver tells you differently. °¨ Eat a variety of complex carbohydrates (rice, wheat, potatoes, bread), lean meats, yogurt, fruits, and vegetables. °¨ Avoid high-fat foods because they are more difficult to digest. °· Drink enough water and fluids to keep your urine clear or pale yellow. °· If you are dehydrated, ask your caregiver for specific rehydration instructions. Signs of dehydration may include: °¨ Severe thirst. °¨ Dry lips and mouth. °¨ Dizziness. °¨ Dark urine. °¨ Decreasing urine frequency and amount. °¨ Confusion. °¨ Rapid breathing or pulse. °SEEK IMMEDIATE MEDICAL CARE IF:  °· You have blood or brown flecks (like coffee grounds) in your vomit. °· You have black or bloody stools. °· You have a severe headache or stiff neck. °· You are confused. °· You have severe abdominal pain. °· You have chest pain or trouble breathing. °· You do not urinate at least once every 8 hours. °· You develop cold or clammy skin. °· You continue to vomit for longer than 24 to 48 hours. °· You have a fever. °MAKE SURE YOU:  °· Understand these instructions. °· Will watch your condition. °· Will get help right away if you are not doing well or get worse. °Document Released: 10/17/2005 Document Revised: 01/09/2012 Document Reviewed: 03/16/2011 °ExitCare® Patient Information ©2015 [employer]. This information is not  intended to replace advice given to you by your health care provider. Make sure you discuss any questions you have with your health care provider. ° °

## 2015-05-21 NOTE — ED Notes (Signed)
Patient reports she was awakened at midnight by severe generalized abdominal pain.  Reports she has also been nauseated and vomiting.  When asked how many times she vomited, patient stated: "A lot."  Unable to guess how many times.  She reports similar episodes in the past, with no known cause.  Says she was diagnosed with a UTI following a visit to GI doctor on 05/19/15.  Has not started antibiotic yet.

## 2015-05-21 NOTE — ED Provider Notes (Signed)
CSN: [id number]     Arrival date & time 05/21/15  0258 History   First MD Initiated Contact with Patient 05/21/15 [phone]     Chief Complaint  Patient presents with  . Abdominal Pain     (Consider location/radiation/quality/duration/timing/severity/associated sxs/prior Treatment) HPI Comments: 44 year old female with a history of depression, headache, and IBS presents to the emergency department for further evaluation of abdominal pain. Patient reports that abdominal pain began at midnight, awaking her from sleep. Pain is sharp and stabbing. She rates the pain as severe. Patient states that pain was present in her left upper abdomen and her left flank. She has had similar episodes of pain in the past for which she has seen a gastroenterologist. Patient reports associated nausea with TNTC episodes of emesis. She tried to take Phenergan for nausea without relief. She also took Bentyl for symptoms which provided no improvement. Patient has had a normal bowel movement while in the emergency department. Patient denies fever, dysuria, hematuria, vaginal bleeding or discharge, chest pain, and shortness of breath. Abdominal surgical history significant for a partial hysterectomy and cesarean section. Patient saw her urologist, Dr. Junious Silk, yesterday who diagnosed her with a urinary tract infection. She has yet to pick up this antibiotic for treatment.  The history is provided by the patient. No language interpreter was used.    Past Medical History  Diagnosis Date  . Arthritis   . Depression   . Headache(784.0)   . HSV-2 infection   . Thrombocytopenia 10/01/2012  . Immune deficiency disorder   . Stroke    Past Surgical History  Procedure Laterality Date  . Partial hysterectomy  2012    due to fibroid  . Wisdom tooth extraction  1995  . Cesarean section  2008    x1  . Abdominal hysterectomy     Family History  Problem Relation Age of Onset  . Diabetes Father   . Cancer Paternal Aunt    breast  . Cancer Paternal Grandmother     colon CA   History  Substance Use Topics  . Smoking status: Never Smoker   . Smokeless tobacco: Never Used     Comment: Pt states she smokes.....but "not cigarettes"  . Alcohol Use: 1.2 oz/week    2 Glasses of wine per week     Comment: occ   OB History    Gravida Para Term Preterm AB TAB SAB Ectopic Multiple Living   3         3      Obstetric Comments   Pt states that she has had a lot of miscarriages. Do not know how many      Review of Systems  Constitutional: Positive for chills. Negative for fever.  Gastrointestinal: Positive for nausea, vomiting and abdominal pain.  Genitourinary: Positive for flank pain. Negative for dysuria and hematuria.  All other systems reviewed and are negative.   Allergies  Trazodone and nefazodone; Amoxicillin; and Carafate  Home Medications   Prior to Admission medications   Medication Sig Start Date End Date Taking? Authorizing Provider  ALPRAZolam Duanne Moron) 1 MG tablet Take 0.5 mg by mouth 2 (two) times daily as needed for anxiety.     Historical Provider, MD  Colchicine 0.6 MG CAPS Take 1 tablet by mouth 2 (two) times daily. Patient not taking: Reported on 05/15/2015 04/14/15   Jearld Fenton, NP  cyclobenzaprine (FLEXERIL) 10 MG tablet Take 1/2 to1 pill up tid prn for headache or muscle pain 02/17/15  Abner Greenspan, MD  diclofenac (VOLTAREN) 75 MG EC tablet Take 1 tablet (75 mg total) by mouth 2 (two) times daily. 03/25/15   Owens Loffler, MD  dicyclomine (BENTYL) 10 MG capsule Take 1 capsule (10 mg total) by mouth 2 (two) times daily. Patient not taking: Reported on 05/15/2015 05/14/15   Lori P Hvozdovic, PA-C  etonogestrel (IMPLANON) 68 MG IMPL implant Inject 1 each into the skin continuous.    Historical Provider, MD  fluticasone (FLONASE) 50 MCG/ACT nasal spray Place 2 sprays into both nostrils daily. Patient not taking: Reported on 05/15/2015 04/07/14   Abner Greenspan, MD  gabapentin (NEURONTIN)  100 MG capsule Take 1 capsule (100 mg total) by mouth 2 (two) times daily. 03/16/15   Abner Greenspan, MD  HYDROcodone-acetaminophen (NORCO/VICODIN) 5-325 MG per tablet Take 1 tablet by mouth every 6 (six) hours as needed for moderate pain. 04/03/15   Jearld Fenton, NP  indomethacin (INDOCIN) 50 MG capsule Take 1 capsule (50 mg total) by mouth 3 (three) times daily with meals. As needed for gout pain Patient not taking: Reported on 05/14/2015 04/03/15   Abner Greenspan, MD  Mirabegron (MYRBETRIQ PO) Take by mouth daily.    Historical Provider, MD  omeprazole (PRILOSEC) 40 MG capsule Take 1 capsule (40 mg total) by mouth daily. Patient not taking: Reported on 05/15/2015 11/29/14   Quintella Reichert, MD  pantoprazole (PROTONIX) 40 MG tablet Take 30 minutes before breakfast Patient not taking: Reported on 05/15/2015 05/14/15   Cecille Rubin P Hvozdovic, PA-C  PARoxetine (PAXIL) 40 MG tablet Take 1 tablet (40 mg total) by mouth daily. Patient not taking: Reported on 05/15/2015 05/26/14   Abner Greenspan, MD  promethazine (PHENERGAN) 25 MG tablet Take 1 tablet (25 mg total) by mouth every 8 (eight) hours as needed for nausea or vomiting. 02/18/15   Abner Greenspan, MD  tetrahydrozoline 0.05 % ophthalmic solution Place 1 drop into both eyes daily as needed (for dry eyes).    Historical Provider, MD  valACYclovir (VALTREX) 500 MG tablet Take 1 tablet (500 mg total) by mouth daily. Patient not taking: Reported on 05/15/2015 08/20/14   Wynelle Fanny [name], MD   BP 160/70 mmHg  Pulse 70  Temp(Src) 98.4 F (36.9 C) (Oral)  Resp 18  SpO2 99%   Physical Exam  Constitutional: She is oriented to person, place, and time. She appears well-developed and well-nourished. No distress.  Nontoxic/nonseptic appearing  HENT:  Head: Normocephalic and atraumatic.  Eyes: Conjunctivae and EOM are normal. No scleral icterus.  Neck: Normal range of motion.  Cardiovascular: Normal rate, regular rhythm and intact distal pulses.   Pulmonary/Chest: Effort  normal and breath sounds normal. No respiratory distress. She has no wheezes. She has no rales.  Respirations even and unlabored  Abdominal: Soft. She exhibits no distension. There is tenderness. There is no rebound and no guarding.  Abdomen is soft and obese. Patient reports tenderness to palpation in her bilateral upper quadrants and epigastric region. No specific focal tenderness appreciated. No masses or peritoneal signs on exam.  Musculoskeletal: Normal range of motion.  Neurological: She is alert and oriented to person, place, and time. She exhibits normal muscle tone. Coordination normal.  Skin: Skin is warm and dry. No rash noted. She is not diaphoretic. No erythema. No pallor.  Psychiatric: Her behavior is normal. Her speech is rapid and/or pressured.  Expressions are very animated  Nursing note and vitals reviewed.   ED Course  Procedures (including  critical care time) Labs Review Labs Reviewed  LIPASE, BLOOD - Abnormal; Notable for the following:    Lipase 18 (*)    All other components within normal limits  COMPREHENSIVE METABOLIC PANEL - Abnormal; Notable for the following:    Glucose, Bld 127 (*)    Creatinine, Ser 1.11 (*)    GFR calc non Af Amer 59 (*)    All other components within normal limits  CBC - Abnormal; Notable for the following:    Platelets 145 (*)    All other components within normal limits  URINALYSIS, ROUTINE W REFLEX MICROSCOPIC (NOT AT [facility]) - Abnormal; Notable for the following:    APPearance CLOUDY (*)    Hgb urine dipstick MODERATE (*)    Ketones, ur 15 (*)    All other components within normal limits  URINE CULTURE  URINE MICROSCOPIC-ADD ON    Imaging Review No results found.   EKG Interpretation None      MDM   Final diagnoses:  Generalized abdominal pain  Non-intractable vomiting with nausea, vomiting of unspecified type    44 year old female presents to the emergency department for further evaluation of abdominal pain.  Physical exam notable for tenderness to palpation in the bilateral upper quadrants. Patient is very animated during the exam. Her abdomen is soft without masses or peritoneal signs. Patient treated in the emergency department with Reglan, Toradol, Protonix, and Zofran. Patient also given IV fluids.  Workup reviewed which is noncontributory. Patient has no fever or leukocytosis to suggest infection. Her urinalysis is positive for microscopic hematuria and likely dehydration. There are no overt signs of infection in the urine. Urine culture sent for testing.  Abdominal reexam is improved on recheck. Patient has been tolerating fluids in the emergency department by mouth without further emesis. She states that she is feeling much better. Have discussed the need for her to fill her antibiotic prescription from her urologist. I see no indication for IV antibiotics at this time. Also possible that patient may have a kidney stone. However, patient has no history of kidney stones and has no evidence of stones on her most recent CT from June 2016. Doubt emergent intra-abdominal process especially given history of similar symptoms. Suspect that pain is secondary to patient's known chronic abdominal pain complaints.  Patient advised to follow-up with her gastroenterologist as well as her urologist regarding her symptoms today. Patient referred back to her primary care doctor for recheck. Return precautions discussed and provided. Patient agreeable to plan with no unaddressed concerns. Patient discharged in good condition.   Filed Vitals:   05/21/15 0308 05/21/15 0619  BP: 163/95 160/70  Pulse: 76 70  Temp: 98.4 F (36.9 C)   TempSrc: Oral   Resp: 18 18  SpO2: 100% 99%     Antonietta Breach, PA-C 05/22/15 3300  Debby Freiberg, MD 05/30/15 [phone]

## 2015-05-22 ENCOUNTER — Encounter (HOSPITAL_COMMUNITY): Payer: Self-pay

## 2015-05-22 ENCOUNTER — Telehealth: Payer: Self-pay | Admitting: Internal Medicine

## 2015-05-22 ENCOUNTER — Emergency Department (HOSPITAL_COMMUNITY)
Admission: EM | Admit: 2015-05-22 | Discharge: 2015-05-22 | Disposition: A | Discharge status: Home / Self Care | Payer: Medicare Other | Attending: Emergency Medicine | Admitting: Emergency Medicine

## 2015-05-22 DIAGNOSIS — F329 Major depressive disorder, single episode, unspecified: Secondary | ICD-10-CM | POA: Diagnosis not present

## 2015-05-22 DIAGNOSIS — Z8619 Personal history of other infectious and parasitic diseases: Secondary | ICD-10-CM | POA: Insufficient documentation

## 2015-05-22 DIAGNOSIS — R112 Nausea with vomiting, unspecified: Secondary | ICD-10-CM | POA: Insufficient documentation

## 2015-05-22 DIAGNOSIS — M1990 Unspecified osteoarthritis, unspecified site: Secondary | ICD-10-CM | POA: Insufficient documentation

## 2015-05-22 DIAGNOSIS — Z791 Long term (current) use of non-steroidal anti-inflammatories (NSAID): Secondary | ICD-10-CM | POA: Insufficient documentation

## 2015-05-22 DIAGNOSIS — Z8673 Personal history of transient ischemic attack (TIA), and cerebral infarction without residual deficits: Secondary | ICD-10-CM | POA: Diagnosis not present

## 2015-05-22 DIAGNOSIS — Z880 Allergy status to penicillin: Secondary | ICD-10-CM | POA: Diagnosis not present

## 2015-05-22 DIAGNOSIS — Z79899 Other long term (current) drug therapy: Secondary | ICD-10-CM | POA: Insufficient documentation

## 2015-05-22 DIAGNOSIS — Z862 Personal history of diseases of the blood and blood-forming organs and certain disorders involving the immune mechanism: Secondary | ICD-10-CM | POA: Insufficient documentation

## 2015-05-22 LAB — COMPREHENSIVE METABOLIC PANEL
ALBUMIN: 4.2 g/dL (ref 3.5–5.0)
ALT: 16 U/L (ref 14–54)
ANION GAP: 9 (ref 5–15)
AST: 22 U/L (ref 15–41)
Alkaline Phosphatase: 45 U/L (ref 38–126)
BUN: 10 mg/dL (ref 6–20)
CO2: 22 mmol/L (ref 22–32)
Calcium: 9.3 mg/dL (ref 8.9–10.3)
Chloride: 106 mmol/L (ref 101–111)
Creatinine, Ser: 0.93 mg/dL (ref 0.44–1.00)
GFR calc Af Amer: >60 mL/min (ref >60)
GLUCOSE: 140 mg/dL — AB (ref 65–99)
Potassium: 3.9 mmol/L (ref 3.5–5.1)
Sodium: 137 mmol/L (ref 135–145)
Total Bilirubin: 0.8 mg/dL (ref 0.3–1.2)
Total Protein: 7.5 g/dL (ref 6.5–8.1)

## 2015-05-22 LAB — CBC WITH DIFFERENTIAL/PLATELET
Basophils Absolute: 0 10*3/uL (ref 0.0–0.1)
Basophils Relative: 0 % (ref 0–1)
EOS ABS: 0 10*3/uL (ref 0.0–0.7)
Eosinophils Relative: 0 % (ref 0–5)
HCT: 41.4 % (ref 36.0–46.0)
HEMOGLOBIN: 14.1 g/dL (ref 12.0–15.0)
LYMPHS ABS: 1.2 10*3/uL (ref 0.7–4.0)
Lymphocytes Relative: 15 % (ref 12–46)
MCH: 32.5 pg (ref 26.0–34.0)
MCHC: 34.1 g/dL (ref 30.0–36.0)
MCV: 95.4 fL (ref 78.0–100.0)
Monocytes Absolute: 0.4 10*3/uL (ref 0.1–1.0)
Monocytes Relative: 6 % (ref 3–12)
NEUTROS PCT: 79 % — AB (ref 43–77)
Neutro Abs: 6.2 10*3/uL (ref 1.7–7.7)
Platelets: 150 10*3/uL (ref 150–400)
RBC: 4.34 MIL/uL (ref 3.87–5.11)
RDW: 13.5 % (ref 11.5–15.5)
WBC: 7.8 10*3/uL (ref 4.0–10.5)

## 2015-05-22 LAB — URINE CULTURE

## 2015-05-22 LAB — LIPASE, BLOOD: Lipase: 15 U/L — ABNORMAL LOW (ref 22–51)

## 2015-05-22 MED ORDER — ONDANSETRON 4 MG PO TBDP: 4.0000 mg | ORAL_TABLET | Freq: Three times a day (TID) | ORAL | Status: DC | PRN

## 2015-05-22 MED ORDER — ONDANSETRON HCL 4 MG/2ML IJ SOLN
4.0000 mg | Freq: Once | INTRAMUSCULAR | Status: AC
  Administered 2015-05-22: 4 mg via INTRAVENOUS
  Filled 2015-05-22: qty 2

## 2015-05-22 MED ORDER — KETOROLAC TROMETHAMINE 30 MG/ML IJ SOLN
30.0000 mg | Freq: Once | INTRAMUSCULAR | Status: AC
  Administered 2015-05-22: 30 mg via INTRAVENOUS
  Filled 2015-05-22: qty 1

## 2015-05-22 MED ORDER — SODIUM CHLORIDE 0.9 % IV BOLUS (SEPSIS)
1000.0000 mL | Freq: Once | INTRAVENOUS | Status: AC
  Administered 2015-05-22: 1000 mL via INTRAVENOUS

## 2015-05-22 NOTE — Discharge Instructions (Signed)

## 2015-05-22 NOTE — ED Notes (Signed)
Pt was given PO fluids to drink

## 2015-05-22 NOTE — Telephone Encounter (Signed)
Left message for patient to call back  

## 2015-05-22 NOTE — ED Notes (Signed)
Pt was diagnosed with a UTI on Tuesday, she had not taken any of her Rx yet, but began having nausea and vomiting Wed morning and was seen here for this. She was given phenergan which she last took around 2000 she took microbid for her UTI around 2100. She has had 2 episodes of emesis today and has lower abdominal pain and "burning" today.

## 2015-05-22 NOTE — ED Provider Notes (Signed)
This chart was scribed for [place], DO by Forrestine Him, ED Scribe. This patient was seen in room WA19/WA19 and the patient's care was started 1:48 AM.   TIME SEEN: 1:48 AM   CHIEF COMPLAINT:  Chief Complaint  Patient presents with  . Emesis     HPI:  HPI Comments: Dana Greer is a 44 y.o. female without any pertinent past medical history who presents to the Emergency Department complaining of intermittent, ongoing, unchanged vomiting x 2 days. 2 episodes of vomiting reported since time of onset. She also reports chills, nausea, and abdominal soreness described as "burning". States she is taking Phenergan at home.. Last dose taken at 2000. Patient also recently had cystoscopy for chronic hematuria. Was recently started on Macrobid by her urologist. Denies any discharge, gross hematuria, dysuria, vaginal bleeding. Pt with known allergies to Trazodone, Amoxicillin, and Carafate.  ROS: See HPI Constitutional: no fever. Positive for chills Eyes: no drainage  ENT: no runny nose   Cardiovascular:  no chest pain  Resp: no SOB  GI: Positive for vomiting, nausea, and abdominal pain GU: no dysuria Integumentary: no rash  Allergy: no hives  Musculoskeletal: no leg swelling  Neurological: no slurred speech ROS otherwise negative  PAST MEDICAL HISTORY/PAST SURGICAL HISTORY:  Past Medical History  Diagnosis Date  . Arthritis   . Depression   . Headache(784.0)   . HSV-2 infection   . Thrombocytopenia 10/01/2012  . Immune deficiency disorder   . Stroke     MEDICATIONS:  Prior to Admission medications   Medication Sig Start Date End Date Taking? Authorizing Provider  ALPRAZolam Duanne Moron) 1 MG tablet Take 0.5 mg by mouth 2 (two) times daily as needed for anxiety.     Historical Provider, MD  Colchicine 0.6 MG CAPS Take 1 tablet by mouth 2 (two) times daily. Patient not taking: Reported on 05/15/2015 04/14/15   Jearld Fenton, NP  cyclobenzaprine (FLEXERIL) 10 MG tablet Take  1/2 to1 pill up tid prn for headache or muscle pain 02/17/15   Abner Greenspan, MD  diclofenac (VOLTAREN) 75 MG EC tablet Take 1 tablet (75 mg total) by mouth 2 (two) times daily. 03/25/15   Owens Loffler, MD  dicyclomine (BENTYL) 10 MG capsule Take 1 capsule (10 mg total) by mouth 2 (two) times daily. Patient not taking: Reported on 05/15/2015 05/14/15   Lori P Hvozdovic, PA-C  etonogestrel (IMPLANON) 68 MG IMPL implant Inject 1 each into the skin continuous.    Historical Provider, MD  fluticasone (FLONASE) 50 MCG/ACT nasal spray Place 2 sprays into both nostrils daily. Patient not taking: Reported on 05/15/2015 04/07/14   Abner Greenspan, MD  gabapentin (NEURONTIN) 100 MG capsule Take 1 capsule (100 mg total) by mouth 2 (two) times daily. 03/16/15   Abner Greenspan, MD  HYDROcodone-acetaminophen (NORCO/VICODIN) 5-325 MG per tablet Take 1 tablet by mouth every 6 (six) hours as needed for moderate pain. 04/03/15   Jearld Fenton, NP  indomethacin (INDOCIN) 50 MG capsule Take 1 capsule (50 mg total) by mouth 3 (three) times daily with meals. As needed for gout pain Patient not taking: Reported on 05/14/2015 04/03/15   Abner Greenspan, MD  Mirabegron (MYRBETRIQ PO) Take by mouth daily.    Historical Provider, MD  omeprazole (PRILOSEC) 40 MG capsule Take 1 capsule (40 mg total) by mouth daily. Patient not taking: Reported on 05/15/2015 11/29/14   Quintella Reichert, MD  pantoprazole (PROTONIX) 40 MG tablet Take 30 minutes  before breakfast Patient not taking: Reported on 05/15/2015 05/14/15   Lori P Hvozdovic, PA-C  PARoxetine (PAXIL) 40 MG tablet Take 1 tablet (40 mg total) by mouth daily. Patient not taking: Reported on 05/15/2015 05/26/14   Abner Greenspan, MD  promethazine (PHENERGAN) 25 MG tablet Take 1 tablet (25 mg total) by mouth every 8 (eight) hours as needed for nausea or vomiting. 02/18/15   Abner Greenspan, MD  tetrahydrozoline 0.05 % ophthalmic solution Place 1 drop into both eyes daily as needed (for dry eyes).     Historical Provider, MD  valACYclovir (VALTREX) 500 MG tablet Take 1 tablet (500 mg total) by mouth daily. Patient not taking: Reported on 05/15/2015 08/20/14   Abner Greenspan, MD    ALLERGIES:  Allergies  Allergen Reactions  . Trazodone And Nefazodone Nausea And Vomiting  . Amoxicillin Other (See Comments)    Yeast infection   . Carafate [Sucralfate] Nausea And Vomiting    SOCIAL HISTORY:  History  Substance Use Topics  . Smoking status: Never Smoker   . Smokeless tobacco: Never Used     Comment: Pt states she smokes.....but "not cigarettes"  . Alcohol Use: 1.2 oz/week    2 Glasses of wine per week     Comment: occ    FAMILY HISTORY: Family History  Problem Relation Age of Onset  . Diabetes Father   . Cancer Paternal Aunt     breast  . Cancer Paternal Grandmother     colon CA    EXAM: BP 161/92 mmHg  Pulse 69  Temp(Src) 98.1 F (36.7 C) (Oral)  Resp 18  SpO2 97% CONSTITUTIONAL: Alert and oriented and responds appropriately to questions. Well-appearing; well-nourished, in no distress, nontoxic, afebrile, appears well-hydrated HEAD: Normocephalic EYES: Conjunctivae clear, PERRL ENT: normal nose; no rhinorrhea; moist mucous membranes; pharynx without lesions noted NECK: Supple, no meningismus, no LAD  CARD: RRR; S1 and S2 appreciated; no murmurs, no clicks, no rubs, no gallops RESP: Normal chest excursion without splinting or tachypnea; breath sounds clear and equal bilaterally; no wheezes, no rhonchi, no rales, no hypoxia or respiratory distress, speaking full sentences ABD/GI: Normal bowel sounds; non-distended; soft, no rebound, no guarding, no peritoneal signs; Diffusely tender to abdomen but when distracted, abdomen exam is benign  BACK:  The back appears normal and is non-tender to palpation, there is no CVA tenderness EXT: Normal ROM in all joints; non-tender to palpation; no edema; normal capillary refill; no cyanosis, no calf tenderness or swelling    SKIN:  Normal color for age and race; warm NEURO: Moves all extremities equally, sensation to light touch intact diffusely, cranial nerves II through XII intact PSYCH: The patient's mood and manner are appropriate. Grooming and personal hygiene are appropriate.  MEDICAL DECISION MAKING: Patient here with diffuse abdominal pain. Abdominal exam is completely benign and she is distracted. She states she has had 2 episodes of vomiting at home. Was seen in the emergency department yesterday for the same and had normal labs, urine. We'll repeat labs today to ensure there is no significant change. Do not feel this time she needs abdominal imaging. We'll give IV fluids, Zofran and by mouth challenge. We'll give Toradol for pain.  ED PROGRESS: Patient's labs again are unremarkable. She has not had any vomiting in the emergency department and has been drinking without difficulty. Abdominal exam is still benign. I do not feel she needs emergent imaging. Have recommended close outpatient follow-up. We'll discharge with prescription for Zofran to use  as needed. Discussed return precautions. She verbalized understanding and is comfortable with this plan.    I personally performed the services described in this documentation, which was scribed in my presence. The recorded information has been reviewed and is accurate.   [place], DO 05/22/15 [id number]

## 2015-05-22 NOTE — ED Notes (Addendum)
Patient reports she began vomiting tonight.  Seen last night for same.  Phenergan po taken at home with no relief.  No emesis noted at present time.

## 2015-05-25 ENCOUNTER — Encounter: Payer: Self-pay | Admitting: Family Medicine

## 2015-05-25 ENCOUNTER — Ambulatory Visit (INDEPENDENT_AMBULATORY_CARE_PROVIDER_SITE_OTHER): Payer: Medicare Other | Admitting: Family Medicine

## 2015-05-25 ENCOUNTER — Telehealth: Payer: Self-pay | Admitting: Family Medicine

## 2015-05-25 ENCOUNTER — Telehealth: Payer: Self-pay | Admitting: Internal Medicine

## 2015-05-25 VITALS — BP 156/90 | HR 73 | Temp 98.9°F | Ht 62.5 in | Wt 201.5 lb

## 2015-05-25 DIAGNOSIS — K529 Noninfective gastroenteritis and colitis, unspecified: Secondary | ICD-10-CM | POA: Diagnosis not present

## 2015-05-25 NOTE — Progress Notes (Signed)
Subjective:    Patient ID: Dana Greer, female    DOB: May 27, 1971, 44 y.o.   MRN: [id number]  HPI Here for f/u of ED visit for n/v  Seen 7/22 and had been vomiting for 2 d -despite taking phenergan at home  Also chills and nausea   Had been given macrobid from urologist (s/p cystoscopy) This started before the macrobid   givne zofran  Wt is down 10 lb   Nausea is improving  Still getting chills and sweats  Has taken temp -- and no fever however   Of note -some ? Menopausal symptoms    No energy whatsoever  No more abdominal pain   Never had any diarrhea    Last vomiting was last Friday   Results for orders placed or performed during the hospital encounter of 05/22/15  CBC with Differential  Result Value Ref Range   WBC 7.8 4.0 - 10.5 K/uL   RBC 4.34 3.87 - 5.11 MIL/uL   Hemoglobin 14.1 12.0 - 15.0 g/dL   HCT 41.4 36.0 - 46.0 %   MCV 95.4 78.0 - 100.0 fL   MCH 32.5 26.0 - 34.0 pg   MCHC 34.1 30.0 - 36.0 g/dL   RDW 13.5 11.5 - 15.5 %   Platelets 150 150 - 400 K/uL   Neutrophils Relative % 79 (H) 43 - 77 %   Neutro Abs 6.2 1.7 - 7.7 K/uL   Lymphocytes Relative 15 12 - 46 %   Lymphs Abs 1.2 0.7 - 4.0 K/uL   Monocytes Relative 6 3 - 12 %   Monocytes Absolute 0.4 0.1 - 1.0 K/uL   Eosinophils Relative 0 0 - 5 %   Eosinophils Absolute 0.0 0.0 - 0.7 K/uL   Basophils Relative 0 0 - 1 %   Basophils Absolute 0.0 0.0 - 0.1 K/uL  Comprehensive metabolic panel  Result Value Ref Range   Sodium 137 135 - 145 mmol/L   Potassium 3.9 3.5 - 5.1 mmol/L   Chloride 106 101 - 111 mmol/L   CO2 22 22 - 32 mmol/L   Glucose, Bld 140 (H) 65 - 99 mg/dL   BUN 10 6 - 20 mg/dL   Creatinine, Ser 0.93 0.44 - 1.00 mg/dL   Calcium 9.3 8.9 - 10.3 mg/dL   Total Protein 7.5 6.5 - 8.1 g/dL   Albumin 4.2 3.5 - 5.0 g/dL   AST 22 15 - 41 U/L   ALT 16 14 - 54 U/L   Alkaline Phosphatase 45 38 - 126 U/L   Total Bilirubin 0.8 0.3 - 1.2 mg/dL   GFR calc non Af Amer >60 >60 mL/min   GFR calc Af Amer >60 >60 mL/min   Anion gap 9 5 - 15  Lipase, blood  Result Value Ref Range   Lipase 15 (L) 22 - 51 U/L    Patient Active Problem List   Diagnosis Date Noted  . Gastroenteritis 05/25/2015  . Pain of right thumb 03/16/2015  . Carpal tunnel syndrome 03/16/2015  . Left hip pain 02/17/2015  . Shoulder pain, left 02/17/2015  . Nausea without vomiting 02/17/2015  . Knee pain, bilateral 01/07/2015  . Gout 12/22/2014  . Visit for routine gyn exam 08/04/2014  . Visit for screening mammogram 08/04/2014  . Pain, joint, multiple sites 05/26/2014  . Viral URI with cough 04/07/2014  . ETD (eustachian tube dysfunction) 04/07/2014  . Vertiginous migraine 02/03/2014  . Right hand pain 09/24/2013  . Coccyx pain 03/27/2013  .  Sacroiliac dysfunction 03/27/2013  . Thrombocytopenia 10/01/2012  . Intractable migraine 08/31/2012  . Osteoarthritis 05/11/2012  . Female bladder prolapse 05/11/2012  . Hypokalemia 05/11/2012  . Allergic conjunctivitis 08/11/2011  . ADJ DISORDER WITH MIXED ANXIETY & DEPRESSED MOOD 06/30/2008  . HYPERHIDROSIS 01/25/2008  . BACK PAIN, LUMBAR, CHRONIC 10/16/2007  . REACTION, ACUTE STRESS W/EMOTIONAL DSTURB 08/01/2007  . HYPERGLYCEMIA 08/01/2007  . ELEVATED BLOOD PRESSURE WITHOUT DIAGNOSIS OF HYPERTENSION 08/01/2007  . GOITER NOS 07/11/2007  . POLYCYSTIC OVARIAN DISEASE 05/24/2007  . HIRSUTISM 05/24/2007  . ARTHROPATHY NOS, MULTIPLE SITES 05/24/2007   Past Medical History  Diagnosis Date  . Arthritis   . Depression   . Headache(784.0)   . HSV-2 infection   . Thrombocytopenia 10/01/2012  . Immune deficiency disorder   . Stroke    Past Surgical History  Procedure Laterality Date  . Partial hysterectomy  2012    due to fibroid  . Wisdom tooth extraction  1995  . Cesarean section  2008    x1  . Abdominal hysterectomy     History  Substance Use Topics  . Smoking status: Never Smoker   . Smokeless tobacco: Never Used     Comment: Pt states she  smokes.....but "not cigarettes"  . Alcohol Use: 1.2 oz/week    2 Glasses of wine per week     Comment: occ   Family History  Problem Relation Age of Onset  . Diabetes Father   . Cancer Paternal Aunt     breast  . Cancer Paternal Grandmother     colon CA   Allergies  Allergen Reactions  . Trazodone And Nefazodone Nausea And Vomiting  . Amoxicillin Other (See Comments)    Yeast infection   . Carafate [Sucralfate] Nausea And Vomiting   Current Outpatient Prescriptions on File Prior to Visit  Medication Sig Dispense Refill  . ALPRAZolam (XANAX) 1 MG tablet Take 0.5 mg by mouth 2 (two) times daily as needed for anxiety.     . Colchicine 0.6 MG CAPS Take 1 tablet by mouth 2 (two) times daily. 60 capsule 0  . cyclobenzaprine (FLEXERIL) 10 MG tablet Take 1/2 to1 pill up tid prn for headache or muscle pain 30 tablet 3  . diclofenac (VOLTAREN) 75 MG EC tablet Take 1 tablet (75 mg total) by mouth 2 (two) times daily. 50 tablet 0  . dicyclomine (BENTYL) 10 MG capsule Take 1 capsule (10 mg total) by mouth 2 (two) times daily. 60 capsule 2  . etonogestrel (IMPLANON) 68 MG IMPL implant Inject 1 each into the skin continuous.    . fluticasone (FLONASE) 50 MCG/ACT nasal spray Place 2 sprays into both nostrils daily. 16 g 6  . gabapentin (NEURONTIN) 100 MG capsule Take 1 capsule (100 mg total) by mouth 2 (two) times daily. 60 capsule 3  . HYDROcodone-acetaminophen (NORCO/VICODIN) 5-325 MG per tablet Take 1 tablet by mouth every 6 (six) hours as needed for moderate pain. 20 tablet 0  . indomethacin (INDOCIN) 50 MG capsule Take 1 capsule (50 mg total) by mouth 3 (three) times daily with meals. As needed for gout pain 30 capsule 3  . Mirabegron (MYRBETRIQ PO) Take by mouth daily.    . nitrofurantoin, macrocrystal-monohydrate, (MACROBID) 100 MG capsule Take 100 mg by mouth 2 (two) times daily.    Marland Kitchen omeprazole (PRILOSEC) 40 MG capsule Take 1 capsule (40 mg total) by mouth daily. 14 capsule 0  .  ondansetron (ZOFRAN ODT) 4 MG disintegrating tablet Take 1 tablet (4 mg  total) by mouth every 8 (eight) hours as needed for nausea or vomiting. 20 tablet 0  . pantoprazole (PROTONIX) 40 MG tablet Take 30 minutes before breakfast 30 tablet 3  . PARoxetine (PAXIL) 40 MG tablet Take 1 tablet (40 mg total) by mouth daily. 30 tablet 11  . promethazine (PHENERGAN) 25 MG tablet Take 1 tablet (25 mg total) by mouth every 8 (eight) hours as needed for nausea or vomiting. 30 tablet 0  . tetrahydrozoline 0.05 % ophthalmic solution Place 1 drop into both eyes daily as needed (for dry eyes).    . valACYclovir (VALTREX) 500 MG tablet Take 1 tablet (500 mg total) by mouth daily. 30 tablet 3   No current facility-administered medications on file prior to visit.    Review of Systems    Review of Systems  Constitutional: Negative for fever, appetite change,  and unexpected weight change. pos for fatigue and malaise  Eyes: Negative for pain and visual disturbance.  Respiratory: Negative for cough and shortness of breath.   Cardiovascular: Negative for cp or palpitations    Gastrointestinal: Negative for , diarrhea and constipation. neg for blood in stool/ dark stool  Genitourinary: Negative for urgency and frequency. neg for dysuria/ pos for urinary incontinence  Skin: Negative for pallor or rash   Neurological: Negative for weakness, light-headedness, numbness and headaches.  Hematological: Negative for adenopathy. Does not bruise/bleed easily.  Psychiatric/Behavioral: Negative for dysphoric mood. The patient is not nervous/anxious.      Objective:   Physical Exam  Constitutional: She appears well-developed and well-nourished. No distress.  Obese and fatigued appearing   HENT:  Head: Normocephalic and atraumatic.  Mouth/Throat: Oropharynx is clear and moist. No oropharyngeal exudate.  MM are moist   Eyes: Conjunctivae and EOM are normal. Pupils are equal, round, and reactive to light. Right eye  exhibits no discharge. Left eye exhibits no discharge. No scleral icterus.  Neck: Normal range of motion. Neck supple.  Cardiovascular: Normal rate and regular rhythm.   Pulmonary/Chest: Effort normal and breath sounds normal. No respiratory distress. She has no wheezes. She has no rales.  Abdominal: Soft. Bowel sounds are normal. She exhibits no distension and no mass. There is no tenderness. There is no rebound and no guarding.  Musculoskeletal: She exhibits no edema.  Lymphadenopathy:    She has no cervical adenopathy.  Neurological: She is alert.  Skin: Skin is warm and dry. No rash noted. No pallor.  Brisk capillary refill   Psychiatric: She has a normal mood and affect.          Assessment & Plan:   Problem List Items Addressed This Visit    Gastroenteritis - Primary    Suspect viral-now improving  Reassuring exam- disc imp of gradual hydration- starting with sips of clear fluids and advancing slowly Then BRAT diet when ready Will update if return of n/v or abd pain or any fever  Rev EGD 7/15- mild gastritis- bx done /pending   (no ulcers)

## 2015-05-25 NOTE — Progress Notes (Signed)
Quick Note:  Stomach biopsies were ok Recent nausea and vomiting after EGD thought to be viral per PCP rec take PPI and dicyclomine and f/u prn [place]  [facility] - no recall or letter ______

## 2015-05-25 NOTE — Telephone Encounter (Signed)
Pt was seen

## 2015-05-25 NOTE — Telephone Encounter (Signed)
Pt has appt 05/25/15 at 3:15 with Dr Glori Bickers.

## 2015-05-25 NOTE — Telephone Encounter (Signed)
Patient Name: Dana Greer  DOB: [birth year]    Initial Comment Caller states she has been vomiting over and over and she was seen in the ED twice and does not feel better and she is sweating everywhere.    Nurse Assessment  Nurse: Leilani Merl, RN, Heather Date/Time (Eastern Time): 05/25/2015 1:53:57 PM  Confirm and document reason for call. If symptomatic, describe symptoms. ---Caller states she has been vomiting over and over since Thursday and she was seen in the ED twice and they told her that it was probably the UTI that she was being treated for. and she does not feel better and she is sweating all over her body. She is shaky, weak and nauseated. She has not vomited since Friday but she is not able to eat at all, she is drinking, but she is very weak.  Has the patient traveled out of the country within the last 30 days? ---Not Applicable  Does the patient require triage? ---Yes  Related visit to physician within the last 2 weeks? ---Yes  Does the PT have any chronic conditions? (i.e. diabetes, asthma, etc.) ---Yes  List chronic conditions. ---see MR  Did the patient indicate they were pregnant? ---No     Guidelines    Guideline Title Affirmed Question Affirmed Notes  Weakness (Generalized) and Fatigue [1] MODERATE weakness (i.e., interferes with work, school, normal activities) AND [2] cause unknown (Exceptions: weakness with acute minor illness, or weakness from poor fluid intake)    Final Disposition User   See Physician within 4 Hours (or PCP triage) Leilani Merl, RN, [employer] states that she was given an appt today for 3:15 pm.   Referrals  REFERRED TO PCP OFFICE   Disagree/Comply: Comply

## 2015-05-25 NOTE — Progress Notes (Signed)
Pre visit review using our clinic review tool, if applicable. No additional management support is needed unless otherwise documented below in the visit note. 

## 2015-05-25 NOTE — Telephone Encounter (Signed)
Patient will see her primary care today.  See phone note from 05/22/15 for additional details

## 2015-05-25 NOTE — Assessment & Plan Note (Signed)
Suspect viral-now improving  Reassuring exam- disc imp of gradual hydration- starting with sips of clear fluids and advancing slowly Then BRAT diet when ready Will update if return of n/v or abd pain or any fever  Rev EGD 7/15- mild gastritis- bx done /pending   (no ulcers)

## 2015-05-25 NOTE — Telephone Encounter (Signed)
Left message for patient to call back  

## 2015-05-25 NOTE — Patient Instructions (Signed)
I suspect you have had a viral gastroenteritis  I'm glad you are improving (vomiting and abdominal pain)  Drink fluids -in sips / and popcicles jello also  Then -when ready - eat small amounts - BRAT diet (bananas/rice/applesauce/toast)  Then you can advance diet gradually as you feel better  If achey - try acetaminophen (tylenol)  Update if not starting to improve in a week or if worsening

## 2015-05-26 NOTE — Telephone Encounter (Signed)
See the pathology results for Dr. Carlean Purl recommendations and additional documentation,

## 2015-06-02 DIAGNOSIS — N949 Unspecified condition associated with female genital organs and menstrual cycle: Secondary | ICD-10-CM | POA: Diagnosis not present

## 2015-06-02 DIAGNOSIS — R102 Pelvic and perineal pain: Secondary | ICD-10-CM | POA: Diagnosis not present

## 2015-06-02 DIAGNOSIS — Z113 Encounter for screening for infections with a predominantly sexual mode of transmission: Secondary | ICD-10-CM | POA: Diagnosis not present

## 2015-06-15 DIAGNOSIS — R102 Pelvic and perineal pain: Secondary | ICD-10-CM | POA: Diagnosis not present

## 2015-06-15 DIAGNOSIS — N951 Menopausal and female climacteric states: Secondary | ICD-10-CM | POA: Diagnosis not present

## 2015-08-10 ENCOUNTER — Telehealth: Payer: Self-pay | Admitting: Family Medicine

## 2015-08-10 NOTE — Telephone Encounter (Signed)
Pt is having vertigo and wants to know if she can take over-the-counter meclizine with her other medications.  Please advise.  Best number to reach pt is [phone] / lt

## 2015-08-10 NOTE — Telephone Encounter (Signed)
I think it is ok - just watch out for sleepiness with it  F/u if no improvement

## 2015-08-10 NOTE — Telephone Encounter (Signed)
Pt notified of Dr. [name]'s comments/ recommendations  

## 2015-08-26 DIAGNOSIS — R319 Hematuria, unspecified: Secondary | ICD-10-CM | POA: Diagnosis not present

## 2015-09-21 ENCOUNTER — Telehealth: Payer: Self-pay | Admitting: Physician Assistant

## 2015-09-21 NOTE — Telephone Encounter (Signed)
She will need an appt w/ app or me but may take a few days or until next week?  Is she using ondansetron - and/or promethazine?  She should be

## 2015-09-21 NOTE — Telephone Encounter (Signed)
I attempted to return call.  Someone picked up but there was no response, no machine.  Unable to leave a message/

## 2015-09-22 NOTE — Telephone Encounter (Signed)
Patient notified She will come on 10/05/15 11:45 to see Dr. Carlean Purl She is taking promethazine, but zofran makes her constipated.

## 2015-10-05 ENCOUNTER — Ambulatory Visit (INDEPENDENT_AMBULATORY_CARE_PROVIDER_SITE_OTHER): Payer: Medicare Other | Admitting: Internal Medicine

## 2015-10-05 ENCOUNTER — Encounter: Payer: Self-pay | Admitting: Internal Medicine

## 2015-10-05 VITALS — BP 106/60 | HR 70 | Ht 62.5 in | Wt 211.0 lb

## 2015-10-05 DIAGNOSIS — R51 Headache: Secondary | ICD-10-CM | POA: Diagnosis not present

## 2015-10-05 DIAGNOSIS — H8110 Benign paroxysmal vertigo, unspecified ear: Secondary | ICD-10-CM | POA: Diagnosis not present

## 2015-10-05 DIAGNOSIS — G43A1 Cyclical vomiting, intractable: Secondary | ICD-10-CM | POA: Diagnosis not present

## 2015-10-05 DIAGNOSIS — R197 Diarrhea, unspecified: Secondary | ICD-10-CM | POA: Diagnosis not present

## 2015-10-05 DIAGNOSIS — R519 Headache, unspecified: Secondary | ICD-10-CM

## 2015-10-05 DIAGNOSIS — R1115 Cyclical vomiting syndrome unrelated to migraine: Secondary | ICD-10-CM

## 2015-10-05 MED ORDER — DICYCLOMINE HCL 10 MG PO CAPS: 10.0000 mg | ORAL_CAPSULE | Freq: Every day | ORAL | Status: DC

## 2015-10-05 NOTE — Patient Instructions (Signed)
  Your physician has requested that you go to the basement for the following lab work before leaving today: 24 urine testing  Dr Carlean Purl is going to talk to Dr Glori Bickers about referrals.   Take meclizine at bedtime.   I appreciate the opportunity to care for you. Silvano Rusk, MD, [facility]

## 2015-10-05 NOTE — Progress Notes (Signed)
Subjective:    Patient ID: Dana Greer, female    DOB: 1970-11-23, 44 y.o.   MRN: [id number] Chief complaint: Nausea and vomiting HPI The patient returns after another episode of severe nausea and vomiting. She has these episodes where she'll be asleep around 2 or 3 in the morning, and developed severe feelings of eating hot and sweats. Then intractable nausea and vomiting develops. She often has diarrhea with this it sounds like as well and gets very weak and is wiped out for a day or more. It depends on how long the nausea and vomiting lasts. She has a history of chronic headaches, she has seen a headache clinic. She also saw [facility] neurology in 2013, and the headache clinic was seen in 2014. It sounds like topiramate was prescribed, but she could not develop a good rapport with either one of these clinicians and she did not follow through or follow up.  I had seen her in the summer and performed an unremarkable EGD. I think she's had one more spell since then, and then this would again in November.  He also has a history of vertigo off and on. There does not seem to be any sign of hearing loss or tinnitus that I can pull out of her today. She is actually okay right now. She has been okay for about 1-2 weeks, she is using Nexium at bedtime and not eating in the afternoon or evening to try to avoid vomiting and thinks that has helped.  Dicyclomine helps her diarrhea but causes constipation at 10 mg twice a day.  She has use meclizine with some relief.  Medications, allergies, past medical history, past surgical history, family history and social history are reviewed and updated in the EMR.  Review of Systems As above. She is frustrated about her chronic recurrent problems.    Objective:   Physical Exam BP 106/60 mmHg  Pulse 70  Ht 5' 2.5" (1.588 m)  Wt 211 lb (95.709 kg)  BMI 37.95 kg/m2 Obese no acute distress looks well Abdomen is soft mildly tender in the epigastrium  bowel sounds are present no masses  Appropriate mood and affect     Assessment & Plan:   1. Intractable cyclical vomiting with nausea   2. Benign paroxysmal positional vertigo, unspecified laterality   3. Frequent headaches   4. Diarrhea, unspecified type    I'm getting the sense that this is not a gastrointestinal problem per se. I think headaches, particular she truly has migraines and the vertigo could be playing a role. 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  I am going to do a 24-hour urine for 5 HIAA for completeness to rule out any type of carcinoid problems as best we can but I doubt that will be positive.  She will reduced dicyclomine to 10 mg daily.   I will confer with Dr. Glori Bickers but I think another neurologic and ENT evaluation would be reasonable, the patient indicates Dr. Glori Bickers suggested ENT evaluation already that the patient did not follow through with that or did not want to eye gas. She is willing to now. Suppressive headache therapy could be appropriate depending upon what neurology thinks.  In the meantime I'm going to suggest she take meclizine at bedtime. She has been taking Nexium instead of her typical Prilosec at bedtime and avoiding meals  in the second half of the day because she is concerned about vomiting. I told her that I doubt what she is going through is triggered by or related to reflux.  I appreciate the opportunity to care for this patient. CC: Loura Pardon, MD

## 2015-10-06 ENCOUNTER — Telehealth: Payer: Self-pay | Admitting: *Deleted

## 2015-10-06 DIAGNOSIS — R42 Dizziness and giddiness: Secondary | ICD-10-CM

## 2015-10-06 NOTE — Telephone Encounter (Signed)
-----   Message from Abner Greenspan, MD sent at 10/05/2015  8:43 PM EST ----- Please let pt know I reviewed her specialist note and ask her if she is ok with a ref to ENT  Thanks

## 2015-10-06 NOTE — Telephone Encounter (Signed)
Called pt and she answered, while I was telling her Dr. Marliss Coots comments her phone hung up on me. I tried to call pt back and she didn't answer and her mailbox was full

## 2015-10-08 DIAGNOSIS — R42 Dizziness and giddiness: Secondary | ICD-10-CM | POA: Insufficient documentation

## 2015-10-08 NOTE — Telephone Encounter (Signed)
Patient called back and said she'd like to be referred to an ENT. Patient has seen an ENT in [place].  She couldn't remember his name and it was a long time ago.  She would like to know who Dr.[name] recommends and she would prefer [facility]. Patient can go anytime.

## 2015-10-08 NOTE — Telephone Encounter (Signed)
I did the ref and will route to [facility]

## 2015-10-12 ENCOUNTER — Other Ambulatory Visit: Payer: Medicare Other

## 2015-10-12 DIAGNOSIS — G43A1 Cyclical vomiting, intractable: Secondary | ICD-10-CM | POA: Diagnosis not present

## 2015-10-12 DIAGNOSIS — R1115 Cyclical vomiting syndrome unrelated to migraine: Secondary | ICD-10-CM

## 2015-10-16 ENCOUNTER — Telehealth: Payer: Self-pay

## 2015-10-16 DIAGNOSIS — R42 Dizziness and giddiness: Secondary | ICD-10-CM | POA: Diagnosis not present

## 2015-10-16 DIAGNOSIS — J3089 Other allergic rhinitis: Secondary | ICD-10-CM | POA: Diagnosis not present

## 2015-10-16 NOTE — Telephone Encounter (Signed)
Dana Greer with Dr Janace Hoard at [facility] ENT office left v/m; pt was seen at Dr Janace Hoard office earlier today and Dr Janace Hoard wants to start pt on Dyazide for dizziness. Dr Janace Hoard would ck K in 2 weeks if OK for pt to start Dyazide. Dana Greer request cb.

## 2015-10-16 NOTE — Telephone Encounter (Signed)
That is fine with me as long as we watch her labs - I want to check bmet in 2 weeks please   She also needs to know with hx of gout - sometimes diuretics can increase gout flares-it that occurs we will have to be aware   I hope it helps

## 2015-10-19 NOTE — Telephone Encounter (Signed)
[facility] notified of Dr. Marliss Coots comments and recommendations, they will do a bmet there in 2 weeks

## 2015-10-20 LAB — 5 HIAA, QUANTITATIVE, URINE, 24 HOUR: 5-HIAA, 24 Hr Urine: 1.4 mg/24 h (ref <=6.0)

## 2015-10-20 NOTE — Progress Notes (Signed)
Quick Note:  5 HIAA neg Will notify by My Chart ______

## 2015-11-03 ENCOUNTER — Other Ambulatory Visit: Payer: Self-pay | Admitting: *Deleted

## 2015-11-03 NOTE — Telephone Encounter (Signed)
Please refill times 3 Thanks  

## 2015-11-03 NOTE — Telephone Encounter (Signed)
Patient is switching pharmacies and is requesting a new prescription for Valtrex be sent [facility].  Okay to refill?

## 2015-11-04 MED ORDER — VALACYCLOVIR HCL 500 MG PO TABS: 500.0000 mg | ORAL_TABLET | Freq: Every day | ORAL | Status: DC

## 2015-11-25 ENCOUNTER — Ambulatory Visit (INDEPENDENT_AMBULATORY_CARE_PROVIDER_SITE_OTHER): Payer: Medicare Other | Admitting: Family Medicine

## 2015-11-25 ENCOUNTER — Encounter: Payer: Self-pay | Admitting: Family Medicine

## 2015-11-25 VITALS — BP 106/76 | HR 86 | Temp 99.2°F | Ht 62.5 in | Wt 213.5 lb

## 2015-11-25 DIAGNOSIS — M129 Arthropathy, unspecified: Secondary | ICD-10-CM

## 2015-11-25 DIAGNOSIS — M5440 Lumbago with sciatica, unspecified side: Secondary | ICD-10-CM

## 2015-11-25 DIAGNOSIS — T887XXA Unspecified adverse effect of drug or medicament, initial encounter: Secondary | ICD-10-CM

## 2015-11-25 DIAGNOSIS — T50905A Adverse effect of unspecified drugs, medicaments and biological substances, initial encounter: Secondary | ICD-10-CM

## 2015-11-25 DIAGNOSIS — G43109 Migraine with aura, not intractable, without status migrainosus: Secondary | ICD-10-CM | POA: Diagnosis not present

## 2015-11-25 DIAGNOSIS — Z23 Encounter for immunization: Secondary | ICD-10-CM

## 2015-11-25 DIAGNOSIS — G8929 Other chronic pain: Secondary | ICD-10-CM

## 2015-11-25 LAB — BASIC METABOLIC PANEL
BUN: 14 mg/dL (ref 6–23)
CHLORIDE: 106 meq/L (ref 96–112)
CO2: 28 mEq/L (ref 19–32)
CREATININE: 1.01 mg/dL (ref 0.40–1.20)
Calcium: 9.1 mg/dL (ref 8.4–10.5)
GFR: 76.16 mL/min (ref >60.00)
Glucose, Bld: 92 mg/dL (ref 70–99)
Potassium: 3.8 mEq/L (ref 3.5–5.1)
Sodium: 140 mEq/L (ref 135–145)

## 2015-11-25 MED ORDER — GABAPENTIN 300 MG PO CAPS: 300.0000 mg | ORAL_CAPSULE | Freq: Three times a day (TID) | ORAL | Status: DC

## 2015-11-25 NOTE — Progress Notes (Signed)
Subjective:    Patient ID: Dana Greer, female    DOB: 22-May-1971, 45 y.o.   MRN: [id number]  HPI Here for multiple complaints  Legs and feet bother her for several months  Known OA in knees-that bothers her  Legs hurt-when she walks-they get tired easily - esp if she walks up the steps or stands even for a few minutes  Legs hurt and burn  Feet burn also  Low back still hurts  Some tingling in her fingers (worse in middle 3 fingers)- coming back (was prev imp with gabapentin)  MRI in May MR Lumbar Spine Wo Contrast   Status: Final result       PACS Images     Show images for MR Lumbar Spine Wo Contrast     Study Result     CLINICAL DATA: Pain and weakness in the BILATERAL legs for 2 years.  EXAM: MRI LUMBAR SPINE WITHOUT CONTRAST  TECHNIQUE: Multiplanar, multisequence MR imaging of the lumbar spine was performed. No intravenous contrast was administered.  COMPARISON: None.  FINDINGS: Segmentation: Normal.  Alignment: Normal. No subluxation. Slight scoliosis convex LEFT upper lumbar region without significant RIGHT-sided neural impingement or disc space narrowing.  Vertebrae: No worrisome osseous lesion.  Conus medullaris: Normal in size, signal, and location.  Paraspinal tissues: No evidence for hydronephrosis or paravertebral mass.  Disc levels:  L1-L2: Normal.  L2-L3: Normal.  L3-L4: Normal disc space. Mild facet arthropathy.  L4-L5: Normal disc space. Mild facet arthropathy.  L5-S1: Mild bulge. Mild facet arthropathy. No impingement.      poss myofascial pain syndrome   Stress: her godson died at age 25  Has been rough  Died from the flu (had sickle cell also)  Trying to be strong for her friend   Does want a flu shot today   This stress could add to it   Is taking gabapentin 100 mg bid  Has flexeril but medicaid does not want to pay for it   She still takes diclofenac as needed   Patient Active  Problem List   Diagnosis Date Noted  . Adverse effects of medication 11/25/2015  . Vertigo 10/08/2015  . Gastroenteritis 05/25/2015  . Pain of right thumb 03/16/2015  . Carpal tunnel syndrome 03/16/2015  . Left hip pain 02/17/2015  . Shoulder pain, left 02/17/2015  . Nausea without vomiting 02/17/2015  . Knee pain, bilateral 01/07/2015  . Gout 12/22/2014  . Visit for routine gyn exam 08/04/2014  . Visit for screening mammogram 08/04/2014  . Pain, joint, multiple sites 05/26/2014  . Viral URI with cough 04/07/2014  . ETD (eustachian tube dysfunction) 04/07/2014  . Vertiginous migraine 02/03/2014  . Right hand pain 09/24/2013  . Coccyx pain 03/27/2013  . Sacroiliac dysfunction 03/27/2013  . Thrombocytopenia ([place]) 10/01/2012  . Intractable migraine 08/31/2012  . Osteoarthritis 05/11/2012  . Female bladder prolapse 05/11/2012  . Hypokalemia 05/11/2012  . Allergic conjunctivitis 08/11/2011  . ADJ DISORDER WITH MIXED ANXIETY & DEPRESSED MOOD 06/30/2008  . HYPERHIDROSIS 01/25/2008  . BACK PAIN, LUMBAR, CHRONIC 10/16/2007  . REACTION, ACUTE STRESS W/EMOTIONAL DSTURB 08/01/2007  . HYPERGLYCEMIA 08/01/2007  . ELEVATED BLOOD PRESSURE WITHOUT DIAGNOSIS OF HYPERTENSION 08/01/2007  . GOITER NOS 07/11/2007  . POLYCYSTIC OVARIAN DISEASE 05/24/2007  . HIRSUTISM 05/24/2007  . Arthropathy, multiple sites 05/24/2007   Past Medical History  Diagnosis Date  . Arthritis   . Depression   . Headache(784.0)   . HSV-2 infection   . Thrombocytopenia ([place]) 10/01/2012  .  Immune deficiency disorder ([place])   . Stroke [facility])    Past Surgical History  Procedure Laterality Date  . Partial hysterectomy  2012    due to fibroid  . Wisdom tooth extraction  1995  . Cesarean section  2008    x1  . Abdominal hysterectomy    . Upper gastrointestinal endoscopy     Social History  Substance Use Topics  . Smoking status: Never Smoker   . Smokeless tobacco: Never Used     Comment: Pt states she  smokes.....but "not cigarettes"  . Alcohol Use: 1.2 oz/week    2 Glasses of wine per week     Comment: occ   Family History  Problem Relation Age of Onset  . Diabetes Father   . Breast cancer Paternal Aunt   . Colon cancer Paternal Grandmother    Allergies  Allergen Reactions  . Trazodone And Nefazodone Nausea And Vomiting  . Amoxicillin Other (See Comments)    Yeast infection   . Carafate [Sucralfate] Nausea And Vomiting   Current Outpatient Prescriptions on File Prior to Visit  Medication Sig Dispense Refill  . ALPRAZolam (XANAX) 1 MG tablet Take 0.5 mg by mouth 2 (two) times daily as needed for anxiety.     . Colchicine 0.6 MG CAPS Take 1 tablet by mouth 2 (two) times daily. 60 capsule 0  . cyclobenzaprine (FLEXERIL) 10 MG tablet Take 1/2 to1 pill up tid prn for headache or muscle pain 30 tablet 3  . diclofenac (VOLTAREN) 75 MG EC tablet Take 1 tablet (75 mg total) by mouth 2 (two) times daily. 50 tablet 0  . dicyclomine (BENTYL) 10 MG capsule Take 1 capsule (10 mg total) by mouth daily. 60 capsule 2  . etonogestrel (IMPLANON) 68 MG IMPL implant Inject 1 each into the skin continuous.    . fluticasone (FLONASE) 50 MCG/ACT nasal spray Place 2 sprays into both nostrils daily. 16 g 6  . gabapentin (NEURONTIN) 100 MG capsule Take 1 capsule (100 mg total) by mouth 2 (two) times daily. 60 capsule 3  . HYDROcodone-acetaminophen (NORCO/VICODIN) 5-325 MG per tablet Take 1 tablet by mouth every 6 (six) hours as needed for moderate pain. 20 tablet 0  . indomethacin (INDOCIN) 50 MG capsule Take 1 capsule (50 mg total) by mouth 3 (three) times daily with meals. As needed for gout pain 30 capsule 3  . MECLIZINE HCL PO Take 1 tablet by mouth at bedtime.    . Mirabegron (MYRBETRIQ PO) Take by mouth daily.    Marland Kitchen omeprazole (PRILOSEC) 40 MG capsule Take 1 capsule (40 mg total) by mouth daily. 14 capsule 0  . ondansetron (ZOFRAN ODT) 4 MG disintegrating tablet Take 1 tablet (4 mg total) by mouth  every 8 (eight) hours as needed for nausea or vomiting. 20 tablet 0  . pantoprazole (PROTONIX) 40 MG tablet Take 30 minutes before breakfast 30 tablet 3  . PARoxetine (PAXIL) 40 MG tablet Take 1 tablet (40 mg total) by mouth daily. 30 tablet 11  . promethazine (PHENERGAN) 25 MG tablet Take 1 tablet (25 mg total) by mouth every 8 (eight) hours as needed for nausea or vomiting. 30 tablet 0  . tetrahydrozoline 0.05 % ophthalmic solution Place 1 drop into both eyes daily as needed (for dry eyes).    . valACYclovir (VALTREX) 500 MG tablet Take 1 tablet (500 mg total) by mouth daily. 30 tablet 2   No current facility-administered medications on file prior to visit.  Review of Systems Review of Systems  Constitutional: Negative for fever, appetite change, and unexpected weight change.  Eyes: Negative for pain and visual disturbance.  Respiratory: Negative for cough and shortness of breath.   Cardiovascular: Negative for cp or palpitations    Gastrointestinal: Negative for nausea, diarrhea and constipation.  Genitourinary: Negative for urgency and frequency.  Skin: Negative for pallor or rash   Neurological: Negative for weakness, light-headedness, and pos for headaches. pos for vertigo that is improved and pos for tingling in extremities  Hematological: Negative for adenopathy. Does not bruise/bleed easily.  Psychiatric/Behavioral: pos for grief and depressive symptoms w/o SI. The patient is not nervous/anxious.         Objective:   Physical Exam  Constitutional: She is oriented to person, place, and time. She appears well-developed and well-nourished. No distress.  obese and well appearing   HENT:  Head: Normocephalic and atraumatic.  Right Ear: External ear normal.  Left Ear: External ear normal.  Nose: Nose normal.  Mouth/Throat: Oropharynx is clear and moist. No oropharyngeal exudate.  No sinus tenderness No temporal tenderness  No TMJ tenderness  Eyes: Conjunctivae and EOM are  normal. Pupils are equal, round, and reactive to light. Right eye exhibits no discharge. Left eye exhibits no discharge. No scleral icterus.  No nystagmus  Neck: Normal range of motion and full passive range of motion without pain. Neck supple. No JVD present. Carotid bruit is not present. No tracheal deviation present. No thyromegaly present.  Cardiovascular: Normal rate, regular rhythm and normal heart sounds.   No murmur heard. Pulmonary/Chest: Effort normal and breath sounds normal. No respiratory distress. She has no wheezes. She has no rales.  Abdominal: Soft. Bowel sounds are normal. She exhibits no distension and no mass. There is no tenderness.  Musculoskeletal: She exhibits tenderness. She exhibits no edema.  Tender diffusely over hand MCP joints and phalanges LS -diffuse Knees and elbows and feet   No swollen joints  Limited rom of LS due to pain w/o neuro changes   Myofascial trigger points over back  Tender spinal musculature in general  Lymphadenopathy:    She has no cervical adenopathy.  Neurological: She is alert and oriented to person, place, and time. She has normal strength and normal reflexes. She displays no atrophy and no tremor. No cranial nerve deficit or sensory deficit. She exhibits normal muscle tone. She displays a negative Romberg sign. Coordination and gait normal.  No focal cerebellar signs   Skin: Skin is warm and dry. No rash noted. No pallor.  Psychiatric: Her speech is normal and behavior is normal. Thought content normal. Her affect is not blunt and not labile. She exhibits a depressed mood.  Discussing grief-becomes tearful          Assessment & Plan:   Problem List Items Addressed This Visit      Cardiovascular and Mediastinum   Vertiginous migraine    For vertigo- was put on dyazide by her ENT (she will call with the dose so we can put it in her chart) Bmp today Symptoms are improved  Hope that adv her gabapentin may help with headaches as  well      Relevant Medications   gabapentin (NEURONTIN) 300 MG capsule     Musculoskeletal and Integument   Arthropathy, multiple sites - Primary    Ongoing with muscle pain as well  Suspect a combination of myofascial pain and also some OA  Has had rheum w/u  Worse with stress/  sleep problems and recent change in routine (also grief and depression) Will try to titrate gabapentin up to 300 bid -then tid gradually as tolerated  Discussed expectations of this  medication including time to effectiveness and mechanism of action, also poss of side effects (early and late)- including mental fuzziness, weight or appetite change, nausea and poss of worse dep or anxiety (even suicidal thoughts)  Pt voiced understanding and will stop med and update if this occurs          Other   Adverse effects of medication    BMP for new dyazide (from ENT for vertigo) today      Relevant Orders   Basic metabolic panel (Completed)   BACK PAIN, LUMBAR, CHRONIC    Rev deg change on her last MRI  Has had rheum w/o  Has OA and likely myofascial pain syndrome  Will titrate her gabapentin up as tolerated and f/u  Discussed expectations of this  medication including time to effectiveness and mechanism of action, also poss of side effects (early and late)- including mental fuzziness, dizziness  weight or appetite change, nausea and poss of worse dep or anxiety (even suicidal thoughts)  Pt voiced understanding and will stop med and update if this occurs   Hope this will also help sleep       Other Visit Diagnoses    Need for influenza vaccination        Relevant Orders    Flu Vaccine QUAD 36+ mos PF IM (Fluarix & Fluzone Quad PF) (Completed)

## 2015-11-25 NOTE — Progress Notes (Signed)
Pre visit review using our clinic review tool, if applicable. No additional management support is needed unless otherwise documented below in the visit note. 

## 2015-11-25 NOTE — Patient Instructions (Signed)
When you get home please let us know what dose of dyazide you are on  Checking potassium today  BP: 106/76 mmHg -- good  Glad it is helping   Flu shot today  Let's work on increasing your gabapentin for pain and tingling  Now you are taking 100 mg twice daily  Week one increase to 100 mg am , 300 mg at bedtime  Week two if doing well increase to 300 mg am and 300 mg bedtime  Let me know at that point if any problems - ie: too dizzy or sleepy  If doing well at that time you can increase to 300 mg am / lunch and bedtime   Follow up with me in about 6 weeks or so

## 2015-11-26 ENCOUNTER — Other Ambulatory Visit: Payer: Self-pay | Admitting: *Deleted

## 2015-11-26 NOTE — Assessment & Plan Note (Signed)
Rev deg change on her last MRI  Has had rheum w/o  Has OA and likely myofascial pain syndrome  Will titrate her gabapentin up as tolerated and f/u  Discussed expectations of this  medication including time to effectiveness and mechanism of action, also poss of side effects (early and late)- including mental fuzziness, dizziness  weight or appetite change, nausea and poss of worse dep or anxiety (even suicidal thoughts)  Pt voiced understanding and will stop med and update if this occurs   Hope this will also help sleep

## 2015-11-26 NOTE — Assessment & Plan Note (Signed)
BMP for new dyazide (from ENT for vertigo) today

## 2015-11-26 NOTE — Assessment & Plan Note (Signed)
Ongoing with muscle pain as well  Suspect a combination of myofascial pain and also some OA  Has had rheum w/u  Worse with stress/ sleep problems and recent change in routine (also grief and depression) Will try to titrate gabapentin up to 300 bid -then tid gradually as tolerated  Discussed expectations of this  medication including time to effectiveness and mechanism of action, also poss of side effects (early and late)- including mental fuzziness, weight or appetite change, nausea and poss of worse dep or anxiety (even suicidal thoughts)  Pt voiced understanding and will stop med and update if this occurs

## 2015-11-26 NOTE — Assessment & Plan Note (Addendum)
For vertigo- was put on dyazide by her ENT (she will call with the dose so we can put it in her chart) Bmp today Symptoms are improved  Hope that adv her gabapentin may help with headaches as well

## 2016-01-13 ENCOUNTER — Other Ambulatory Visit: Payer: Self-pay | Admitting: Family Medicine

## 2016-01-13 MED ORDER — ONDANSETRON 4 MG PO TBDP: 4.0000 mg | ORAL_TABLET | Freq: Three times a day (TID) | ORAL | Status: DC | PRN

## 2016-01-13 MED ORDER — INDOMETHACIN 50 MG PO CAPS: 50.0000 mg | ORAL_CAPSULE | Freq: Three times a day (TID) | ORAL | Status: DC

## 2016-01-13 NOTE — Telephone Encounter (Signed)
For gout does she remember if the colchicine or the indocin worked better or if she has any of either?   For n/v - she last saw ENT- they thought vertigo may have played a role -unsure if they scheduled a f/u - she may want to let Dr Peggyann Juba office know she is having a flare  If she does not have any meclizine currently I will send some in-please ask what pharmacy (that will help nausea and dizziness also if she has any)

## 2016-01-13 NOTE — Telephone Encounter (Signed)
Sending zofan and indocin Take indocin with food

## 2016-01-13 NOTE — Telephone Encounter (Signed)
[name] notified as instructed by telephone.

## 2016-01-13 NOTE — Telephone Encounter (Signed)
°  The gout has returned need rx or let pt know what to do about this Pt stated the episode of vomiting like the last time has come back  She wanted to know what she know what to do next She went to dr Carlean Purl about this before he sent her back to dr tower.  Please advise  [facility]

## 2016-01-13 NOTE — Telephone Encounter (Signed)
Pt said that the colchicine did work better for her but the indocin is what her insurance will pay for so she is requesting a refill of indocin sent to pharmacy.  Pt said she will check in with Dr. Peggyann Juba office but she does need med until she can reach him. Pt said she tried the meclizine but it didn't help sxs. but the zofran you prescribed last helped a lot more   Pt is requesting a refill of the zofran and indocin sent to [facility]

## 2016-01-14 ENCOUNTER — Encounter: Payer: Self-pay | Admitting: Family Medicine

## 2016-01-14 ENCOUNTER — Ambulatory Visit (INDEPENDENT_AMBULATORY_CARE_PROVIDER_SITE_OTHER): Payer: Medicare Other | Admitting: Family Medicine

## 2016-01-14 VITALS — BP 130/78 | HR 65 | Temp 98.1°F | Resp 17 | Wt 210.2 lb

## 2016-01-14 DIAGNOSIS — R112 Nausea with vomiting, unspecified: Secondary | ICD-10-CM | POA: Diagnosis not present

## 2016-01-14 LAB — CBC WITH DIFFERENTIAL/PLATELET
Basophils Absolute: 0 10*3/uL (ref 0.0–0.1)
Basophils Relative: 0 % (ref 0.0–3.0)
EOS PCT: 0 % (ref 0.0–5.0)
Eosinophils Absolute: 0 10*3/uL (ref 0.0–0.7)
HEMATOCRIT: 43.7 % (ref 36.0–46.0)
HEMOGLOBIN: 14.6 g/dL (ref 12.0–15.0)
LYMPHS ABS: 0.6 10*3/uL — AB (ref 0.7–4.0)
MCHC: 33.4 g/dL (ref 30.0–36.0)
MCV: 97 fl (ref 78.0–100.0)
MONOS PCT: 1.7 % — AB (ref 3.0–12.0)
Monocytes Absolute: 0.2 10*3/uL (ref 0.1–1.0)
Neutro Abs: 8.8 10*3/uL — ABNORMAL HIGH (ref 1.4–7.7)
Platelets: 175 10*3/uL (ref 150.0–400.0)
RBC: 4.51 Mil/uL (ref 3.87–5.11)
RDW: 15.4 % (ref 11.5–15.5)
WBC: 9.5 10*3/uL (ref 4.0–10.5)

## 2016-01-14 LAB — BASIC METABOLIC PANEL
BUN: 10 mg/dL (ref 6–23)
CO2: 24 mEq/L (ref 19–32)
Calcium: 10 mg/dL (ref 8.4–10.5)
Chloride: 102 mEq/L (ref 96–112)
Creatinine, Ser: 0.93 mg/dL (ref 0.40–1.20)
GFR: 83.72 mL/min (ref >60.00)
GLUCOSE: 142 mg/dL — AB (ref 70–99)
Potassium: 3.7 mEq/L (ref 3.5–5.1)
SODIUM: 136 meq/L (ref 135–145)

## 2016-01-14 LAB — LIPASE: Lipase: 12 U/L (ref 11.0–59.0)

## 2016-01-14 LAB — AMYLASE: Amylase: 33 U/L (ref 27–131)

## 2016-01-14 LAB — HEPATIC FUNCTION PANEL
ALBUMIN: 4.9 g/dL (ref 3.5–5.2)
ALT: 15 U/L (ref 0–35)
AST: 18 U/L (ref 0–37)
Alkaline Phosphatase: 52 U/L (ref 39–117)
Bilirubin, Direct: 0.2 mg/dL (ref 0.0–0.3)
Total Bilirubin: 0.6 mg/dL (ref 0.2–1.2)
Total Protein: 8.4 g/dL — ABNORMAL HIGH (ref 6.0–8.3)

## 2016-01-14 MED ORDER — PROMETHAZINE HCL 25 MG/ML IJ SOLN
25.0000 mg | Freq: Once | INTRAMUSCULAR | Status: AC
  Administered 2016-01-14: 25 mg via INTRAMUSCULAR

## 2016-01-14 MED ORDER — PROMETHAZINE HCL 25 MG PO TABS: 25.0000 mg | ORAL_TABLET | Freq: Three times a day (TID) | ORAL | Status: DC | PRN

## 2016-01-14 NOTE — Progress Notes (Signed)
   Subjective:    Patient ID: Dana Greer, female    DOB: December 01, 1970, 45 y.o.   MRN: [id number]  HPI Vomiting- started yesterday.  Hx of similar over the past year.  Last occurred 2 months ago.  Sees GI.  Feeling weak.  Intermittent palpitations.  Difficulty sleeping due to abd discomfort.  Felt constipated so she took Mag Citrate and now having constipation.  Not able to eat solid foods but no difficulty w/ liquid after Zofran.  Taking Zofran twice daily.  Smoking marijuana daily.  Occasional ETOH.  No fevers.  No SOB, dizziness, CP, HA.  Last vomited at 10am.  Mom reports vomiting 'too many to count' today.  Not as bad yesterday.  Pt reports sxs are better relieved w/ phenergan but insurance won't cover it.   Review of Systems For ROS see HPI     Objective:   Physical Exam  Constitutional: She is oriented to person, place, and time. She appears well-developed and well-nourished.  HENT:  Head: Normocephalic and atraumatic.  MMM  Cardiovascular: Normal rate, regular rhythm, normal heart sounds and intact distal pulses.   Pulmonary/Chest: Effort normal and breath sounds normal. No respiratory distress. She has no wheezes. She has no rales.  Abdominal: Soft. Bowel sounds are normal. She exhibits no distension. There is tenderness (diffuse TTP but only minimally TTP when pt is distracted). There is guarding (voluntary guarding).  Neurological: She is alert and oriented to person, place, and time.  Skin: Skin is warm and dry.  No tenting of skin Brisk cap refill  Psychiatric:  Very dramatic in exam room- asking to die repeatedly  Vitals reviewed.         Assessment & Plan:

## 2016-01-14 NOTE — Progress Notes (Signed)
Pre visit review using our clinic review tool, if applicable. No additional management support is needed unless otherwise documented below in the visit note. 

## 2016-01-14 NOTE — Patient Instructions (Addendum)
Follow up with your primary care doctor and/or GI if the vomiting doesn't improve We'll notify you of your lab results and make any changes if needed Phenergan is available at [facility] for $4 for 12 pills Alternat the Zofran and the phenergan every 4 hrs Drink plenty of fluids REST! If not able to drink fluids, please go to the ER Call with any questions or concerns Hang in there!!!

## 2016-01-14 NOTE — Assessment & Plan Note (Signed)
New to provider, according to pt and chart, this is recurrent problem for her.  Reviewed Dr Celesta Aver GI note from 10/05/15.  Pt denies vertigo or dizziness today.  No HA so migraine is not the cause of her vomiting.  No focal abd pain to raise concern for cholecystitis, appendicitis.  No fever.  May be due to viral illness in the community or pt's recurrent cyclical vomiting.  Check labs to r/o infxn or electrolyte disturbance.  No clinical evidence of dehydration- good cap refill, good skin turgor, moist mucous membranes.  Start promethazine.  Encouraged PO intake.  Reviewed supportive care and red flags that should prompt return.  Pt expressed understanding and is in agreement w/ plan.

## 2016-01-27 ENCOUNTER — Encounter: Payer: Self-pay | Admitting: Family Medicine

## 2016-01-27 ENCOUNTER — Telehealth: Payer: Self-pay

## 2016-01-27 ENCOUNTER — Ambulatory Visit (INDEPENDENT_AMBULATORY_CARE_PROVIDER_SITE_OTHER): Payer: Medicare Other | Admitting: Family Medicine

## 2016-01-27 VITALS — BP 118/78 | HR 67 | Temp 98.4°F | Resp 18 | Wt 214.2 lb

## 2016-01-27 DIAGNOSIS — M25561 Pain in right knee: Secondary | ICD-10-CM | POA: Diagnosis not present

## 2016-01-27 MED ORDER — PREDNISONE 10 MG PO TABS: 10.0000 mg | ORAL_TABLET | Freq: Every day | ORAL | Status: DC

## 2016-01-27 NOTE — Telephone Encounter (Signed)
Patient should discuss this with PCP. I do not routinely treat suspected gout with pain medication.

## 2016-01-27 NOTE — Assessment & Plan Note (Signed)
New problem. Suspect gout given exam. Discussed treatment options today: NSAID's, colchicine, steroids (oral and injection). Patient elected to try a prednisone course. Rx given to patient today.

## 2016-01-27 NOTE — Telephone Encounter (Signed)
Pt states that she was in to see you earlier this morning and is having pain, she is requesting a pain medication to take along with the prednisone. Please advise, thanks

## 2016-01-27 NOTE — Progress Notes (Signed)
Subjective:  Patient ID: Dana Greer, female    DOB: 07/17/1971  Age: 45 y.o. MRN: [id number]  CC: R knee pain  HPI:  45 year old female with history of OA and gout presents with R knee pain.  R knee pain  Patient states that she developed some stiffness in her right knee yesterday.  This then developed into severe right knee pain last night.  Knee is exquisitely tender.  She reports difficulty with ambulation. Pain exacerbated by activity.  She has been taking Indomethacin with no relief.  No recent fall, trauma, injury.  No other complaints today.  Social Hx   Social History   Social History  . Marital Status: Legally Separated    Spouse Name: N/A  . Number of Children: 3  . Years of Education: N/A   Occupational History  . Collections, [place]    Social History Main Topics  . Smoking status: Never Smoker   . Smokeless tobacco: Never Used     Comment: Pt states she smokes.....but "not cigarettes"  . Alcohol Use: 1.2 oz/week    2 Glasses of wine per week     Comment: occ  . Drug Use: Yes    Special: Marijuana  . Sexual Activity: Yes    Birth Control/ Protection: None   Other Topics Concern  . None   Social History Narrative   Review of Systems  Constitutional: Negative.   Musculoskeletal:       Right knee pain.   Objective:  BP 118/78 mmHg  Pulse 67  Temp(Src) 98.4 F (36.9 C) (Oral)  Resp 18  Wt 214 lb 4 oz (97.183 kg)  SpO2 98%  BP/Weight 01/27/2016 01/14/2016 [id number]  Systolic BP [id number]  Diastolic BP 78 78 76  Wt. (Lbs) 214.25 210.25 213.5  BMI 38.54 37.82 38.4   Physical Exam  Constitutional: She is oriented to person, place, and time. She appears well-developed. No distress.  Pulmonary/Chest: Effort normal.  Musculoskeletal:  Right knee: Exquisitely tender to palpation diffusely. Patient was tearful with examination secondary to pain. Effusion noted. Mild warmth.  Decreased ROM in all planes.   Neurological: She  is alert and oriented to person, place, and time.  Psychiatric: She has a normal mood and affect.  Vitals reviewed.  Lab Results  Component Value Date   WBC 9.5 01/14/2016   HGB 14.6 01/14/2016   HCT 43.7 01/14/2016   PLT 175.0 01/14/2016   GLUCOSE 142* 01/14/2016   CHOL 100 07/25/2007   TRIG 31 07/25/2007   HDL 39.5 07/25/2007   LDLCALC 54 07/25/2007   ALT 15 01/14/2016   AST 18 01/14/2016   NA 136 01/14/2016   K 3.7 01/14/2016   CL 102 01/14/2016   CREATININE 0.93 01/14/2016   BUN 10 01/14/2016   CO2 24 01/14/2016   TSH 0.75 09/24/2013   HGBA1C 5.7 07/25/2007   Assessment & Plan:   Problem List Items Addressed This Visit    Right knee pain - Primary    New problem. Suspect gout given exam. Discussed treatment options today: NSAID's, colchicine, steroids (oral and injection). Patient elected to try a prednisone course. Rx given to patient today.         Meds ordered this encounter  Medications  . predniSONE (DELTASONE) 10 MG tablet    Sig: Take 1 tablet (10 mg total) by mouth daily with breakfast. 50 mg (5 tablets) daily x 3 days, then 40 mg (4 tablets) daily x 3 days,  then 30 mg (3 tablets) daily x 3 days, then 20 mg (2 tablets) daily x 3 days, then 10 mg (1 tablet) daily x 3 days.    Dispense:  45 tablet    Refill:  0   Follow-up: PRN  [place]

## 2016-01-27 NOTE — Patient Instructions (Signed)
Take the prednisone as prescribed.  Follow up closely with Dr. Glori Bickers  Take care  Dr. Lacinda Axon

## 2016-01-27 NOTE — Progress Notes (Signed)
Pre visit review using our clinic review tool, if applicable. No additional management support is needed unless otherwise documented below in the visit note. 

## 2016-01-28 MED ORDER — TRAMADOL HCL 50 MG PO TABS: 50.0000 mg | ORAL_TABLET | Freq: Three times a day (TID) | ORAL | Status: DC | PRN

## 2016-01-28 NOTE — Telephone Encounter (Signed)
Rx called in as prescribed and pt notified of Dr. Marliss Coots comments and verbalized understanding

## 2016-01-28 NOTE — Telephone Encounter (Signed)
The prednisone should take care of the pain as it works on the gout Can call in a small amt of tramadol to get through the first day or so while it begins to work  Px written for call in

## 2016-02-04 ENCOUNTER — Telehealth: Payer: Self-pay | Admitting: Family Medicine

## 2016-02-04 MED ORDER — COLCHICINE 0.6 MG PO CAPS: 1.0000 | ORAL_CAPSULE | Freq: Two times a day (BID) | ORAL | Status: DC

## 2016-02-04 NOTE — Telephone Encounter (Signed)
Spoke to patient and advised of Dr. Marliss Coots instructions.  Appointment scheduled with Dr. Lorelei Pont for next week, 02/10/16.

## 2016-02-04 NOTE — Addendum Note (Signed)
Addended by: Whitehouse Lions on: 02/04/2016 03:03 PM   Modules accepted: Orders

## 2016-02-04 NOTE — Telephone Encounter (Signed)
1 week ago she saw dr Lacinda Axon He gave her predizone for gout.  It went down and yesterday was great.  But today its swollen again and very painful  Heat is coming off knee again today She is still is taking predizone What do you want her do to?  Dr Lacinda Axon told her to stay off of it. What does he mean stay  ???complete of off knee or can she move around a little bit please advise

## 2016-02-04 NOTE — Telephone Encounter (Signed)
I would definitely make sure she is on - i would start today along with indocin.  Colcrys 0.6 mg,1 po bid, #60, 2 ref

## 2016-02-04 NOTE — Telephone Encounter (Signed)
Patient instructed.  Refill of Colcrys sent to [facility] per patient's request.

## 2016-02-04 NOTE — Telephone Encounter (Signed)
I think he means- elevate it whenever she can  Finish the prednisone  Is there a chance we can get her an appt with Dr Lorelei Pont while I am out of town next week?

## 2016-02-05 NOTE — Telephone Encounter (Signed)
Pt declined to scheduled appt with Dr. Caryl Bis she said she feels like he is going to tell her the same thing. Pt said she will take her dose of colcrys and prednisone and take some aleve and stay at home. Pt said she is in severe pain and she will take an aleve and see how she does today. If she isn't feeling any better she will call back this afternoon and get an appt at our Sat. Clinic at [facility]

## 2016-02-05 NOTE — Telephone Encounter (Signed)
Pt called back Pt stated she needs help She is not getting any better she was up all night with her leg Please advise pt what she needs to do

## 2016-02-05 NOTE — Telephone Encounter (Signed)
Aware, thanks!

## 2016-02-05 NOTE — Telephone Encounter (Signed)
We are very short staffed today- I was told that Dr Caryl Bis had openings if she needs to be seen - or ED if necessary  Please make sure she got her colcrys Sorry she is having a tough time

## 2016-02-10 ENCOUNTER — Ambulatory Visit (INDEPENDENT_AMBULATORY_CARE_PROVIDER_SITE_OTHER): Payer: Medicare Other | Admitting: Family Medicine

## 2016-02-10 ENCOUNTER — Encounter: Payer: Self-pay | Admitting: Family Medicine

## 2016-02-10 VITALS — BP 118/80 | HR 76 | Temp 99.0°F | Ht 62.5 in | Wt 207.4 lb

## 2016-02-10 DIAGNOSIS — M10061 Idiopathic gout, right knee: Secondary | ICD-10-CM

## 2016-02-10 DIAGNOSIS — M25461 Effusion, right knee: Secondary | ICD-10-CM

## 2016-02-10 DIAGNOSIS — M25561 Pain in right knee: Secondary | ICD-10-CM | POA: Diagnosis not present

## 2016-02-10 DIAGNOSIS — M25569 Pain in unspecified knee: Secondary | ICD-10-CM | POA: Diagnosis not present

## 2016-02-10 MED ORDER — METHYLPREDNISOLONE ACETATE 40 MG/ML IJ SUSP
80.0000 mg | Freq: Once | INTRAMUSCULAR | Status: AC
  Administered 2016-02-10: 80 mg via INTRA_ARTICULAR

## 2016-02-10 NOTE — Progress Notes (Signed)
Pre visit review using our clinic review tool, if applicable. No additional management support is needed unless otherwise documented below in the visit note. 

## 2016-02-10 NOTE — Progress Notes (Signed)
Dr. Frederico Hamman T. Shineka Auble, MD, [facility] [place], [address] Phone: [phone] Fax: [phone]  02/10/2016  Patient: Dana Greer, MRN: [id number], DOB: 1971-06-03, 45 y.o.  Primary Physician:  Loura Pardon, MD   Chief Complaint  Patient presents with  . Knee Pain    right for 2 weeks now    Subjective:   Dana Greer is a 45 y.o. very pleasant female patient who presents with the following:  Known h/o gout with R knee pain for 2 weeks. She has never had a joint aspiration per her report to me, and she has non-crystal proven gout. I reviewed her medical record, and I cannot find an elevated uric acid.  Initially, she took some Indocin, but did not really have any relief. She saw a different provider in another office, and was placed on a prednisone taper as below.  Failure of Indocin, then prednisone. 50x3, 40x3, 30x3, 20x3, 10x3.  Now on prednisone. colchicine. 10 mg pred only today.  She called our office at the end of the week, and I recommended colchicine by mouth twice a day along with her oral prednisone until she could be seen in the office.  At this point she is hardly ambulatory, she is in a wheelchair when I first come into the room. She states that the swelling and the redness and warmth has improved since last week, but is still severe pain  Past Medical History, Surgical History, Social History, Family History, Problem List, Medications, and Allergies have been reviewed and updated if relevant.  Patient Active Problem List   Diagnosis Date Noted  . Right knee pain 01/27/2016  . Adverse effects of medication 11/25/2015  . Vertigo 10/08/2015  . Pain of right thumb 03/16/2015  . Carpal tunnel syndrome 03/16/2015  . Left hip pain 02/17/2015  . Shoulder pain, left 02/17/2015  . Knee pain, bilateral 01/07/2015  . Gout 12/22/2014  . Visit for routine gyn exam 08/04/2014  . Visit for  screening mammogram 08/04/2014  . Pain, joint, multiple sites 05/26/2014  . Vertiginous migraine 02/03/2014  . Thrombocytopenia ([place]) 10/01/2012  . Intractable migraine 08/31/2012  . Osteoarthritis 05/11/2012  . Female bladder prolapse 05/11/2012  . ADJ DISORDER WITH MIXED ANXIETY & DEPRESSED MOOD 06/30/2008  . HYPERHIDROSIS 01/25/2008  . BACK PAIN, LUMBAR, CHRONIC 10/16/2007  . REACTION, ACUTE STRESS W/EMOTIONAL DSTURB 08/01/2007  . HYPERGLYCEMIA 08/01/2007  . ELEVATED BLOOD PRESSURE WITHOUT DIAGNOSIS OF HYPERTENSION 08/01/2007  . GOITER NOS 07/11/2007  . POLYCYSTIC OVARIAN DISEASE 05/24/2007  . HIRSUTISM 05/24/2007  . Arthropathy, multiple sites 05/24/2007    Past Medical History  Diagnosis Date  . Arthritis   . Depression   . Headache(784.0)   . HSV-2 infection   . Thrombocytopenia ([name]) 10/01/2012  . Immune deficiency disorder ([place])   . Stroke [facility])     Past Surgical History  Procedure Laterality Date  . Partial hysterectomy  2012    due to fibroid  . Wisdom tooth extraction  1995  . Cesarean section  2008    x1  . Abdominal hysterectomy    . Upper gastrointestinal endoscopy      Social History   Social History  . Marital Status: Legally Separated    Spouse Name: N/A  . Number of Children: 3  . Years of Education: N/A   Occupational History  . Collections, [place]    Social History Main Topics  . Smoking status:  Never Smoker   . Smokeless tobacco: Never Used     Comment: Pt states she smokes.....but "not cigarettes"  . Alcohol Use: 1.2 oz/week    2 Glasses of wine per week     Comment: occ  . Drug Use: Yes    Special: Marijuana  . Sexual Activity: Yes    Birth Control/ Protection: None   Other Topics Concern  . Not on file   Social History Narrative    Family History  Problem Relation Age of Onset  . Diabetes Father   . Breast cancer Paternal Aunt   . Colon cancer Paternal Grandmother     Allergies  Allergen Reactions  . Trazodone And  Nefazodone Nausea And Vomiting  . Amoxicillin Other (See Comments)    Yeast infection   . Carafate [Sucralfate] Nausea And Vomiting    Medication list reviewed and updated in full in [facility].  GEN: No fevers, chills. Nontoxic. Primarily MSK c/o today. MSK: Detailed in the HPI GI: tolerating PO intake without difficulty Neuro: No numbness, parasthesias, or tingling associated. Otherwise the pertinent positives of the ROS are noted above.   Objective:   BP 118/80 mmHg  Pulse 76  Temp(Src) 99 F (37.2 C) (Oral)  Ht 5' 2.5" (1.588 m)  Wt 207 lb 6.4 oz (94.076 kg)  BMI 37.31 kg/m2  SpO2 99%   GEN: WDWN, NAD, Non-toxic, Alert & Oriented x 3 HEENT: Atraumatic, Normocephalic.  Ears and Nose: No external deformity. EXTR: No clubbing/cyanosis/edema NEURO: initially in wheelchair PSYCH: Normally interactive. Conversant. Not depressed or anxious appearing.  Calm demeanor.    Right knee has a large effusion. I am almost unable to touch this to palpation. There is no warmth. There is no redness appreciable. Minimal motion as possible prior to aspiration and injection.  Radiology: No results found.  Assessment and Plan:   Right knee pain - Plan: Body fluid culture, Synovial cell count + diff, w/ crystals, Glucose, synovial fluid, Gram stain, methylPREDNISolone acetate (DEPO-MEDROL) injection 80 mg  Acute idiopathic gout of right knee - Plan: Body fluid culture, Synovial cell count + diff, w/ crystals, Glucose, synovial fluid, Gram stain  Knee effusion, right - Plan: Body fluid culture, Synovial cell count + diff, w/ crystals, Glucose, synovial fluid, Gram stain  Gout vs CPPD. Obtain synovial fluid analysis for definitive diagnosis.  Continue with Indocin 3 times a day as well as Colcrys twice a day.  Aspiration and injection of the right knee.  Knee Aspiration and Injection, R Patient verbally consented; risks, benefits, and alternatives explained including possible  infection. Patient prepped with Chloraprep. Ethyl chloride for anesthesia. 10 cc of 1% Lidocaine used in wheal then injected Subcutaneous fashion with 22 gauge needle on lateral approach. Under sterilne conditions, 18 gauge needle used via lateral approach to aspirate 70 cc of yellow, cloudy fluid. Then 8 cc of Lidocaine 1% and 2 mL of Depo-Medrol 40 mg injected. Tolerated well, decreased pain, no complications.   Range of motion and pain dramatically improved post aspiration and injection.  Patient Instructions  Colcrys, 1 tablet twice a day Indomethacin 50 mg, 1 tablet three times a day   Low-Purine Diet Purines are compounds that affect the level of uric acid in your body. A low-purine diet is a diet that is low in purines. Eating a low-purine diet can prevent the level of uric acid in your body from getting too high and causing gout or kidney stones or both. WHAT DO I NEED TO  KNOW ABOUT THIS DIET?  Choose low-purine foods. Examples of low-purine foods are listed in the next section.  Drink plenty of fluids, especially water. Fluids can help remove uric acid from your body. Try to drink 8-16 cups (1.9-3.8 L) a day.  Limit foods high in fat, especially saturated fat, as fat makes it harder for the body to get rid of uric acid. Foods high in saturated fat include pizza, cheese, ice cream, whole milk, fried foods, and gravies. Choose foods that are lower in fat and lean sources of protein. Use olive oil when cooking as it contains healthy fats that are not high in saturated fat.  Limit alcohol. Alcohol interferes with the elimination of uric acid from your body. If you are having a gout attack, avoid all alcohol.  Keep in mind that different people's bodies react differently to different foods. You will probably learn over time which foods do or do not affect you. If you discover that a food tends to cause your gout to flare up, avoid eating that food. You can more freely enjoy foods that do not  cause problems. If you have any questions about a food item, talk to your dietitian or health care provider. WHICH FOODS ARE LOW, MODERATE, AND HIGH IN PURINES? The following is a list of foods that are low, moderate, and high in purines. You can eat any amount of the foods that are low in purines. You may be able to have small amounts of foods that are moderate in purines. Ask your health care provider how much of a food moderate in purines you can have. Avoid foods high in purines. Grains  Foods low in purines: Enriched white bread, pasta, rice, cake, cornbread, popcorn.  Foods moderate in purines: Whole-grain breads and cereals, wheat germ, bran, oatmeal. Uncooked oatmeal. Dry wheat bran or wheat germ.  Foods high in purines: Pancakes, [place] toast, biscuits, muffins. Vegetables  Foods low in purines: All vegetables, except those that are moderate in purines.  Foods moderate in purines: Asparagus, cauliflower, spinach, mushrooms, green peas. Fruits  All fruits are low in purines. Meats and other Protein Foods  Foods low in purines: Eggs, nuts, peanut butter.  Foods moderate in purines: 80-90% lean beef, lamb, veal, pork, poultry, fish, eggs, peanut butter, nuts. Crab, lobster, oysters, and shrimp. Cooked dried beans, peas, and lentils.  Foods high in purines: Anchovies, sardines, herring, mussels, tuna, codfish, scallops, trout, and haddock. Berniece Salines. Organ meats (such as liver or kidney). Tripe. Game meat. Goose. Sweetbreads. Dairy  All dairy foods are low in purines. Low-fat and fat-free dairy products are best because they are low in saturated fat. Beverages  Drinks low in purines: Water, carbonated beverages, tea, coffee, cocoa.  Drinks moderate in purines: Soft drinks and other drinks sweetened with high-fructose corn syrup. Juices. To find whether a food or drink is sweetened with high-fructose corn syrup, look at the ingredients list.  Drinks high in purines: Alcoholic  beverages (such as beer). Condiments  Foods low in purines: Salt, herbs, olives, pickles, relishes, vinegar.  Foods moderate in purines: Butter, margarine, oils, mayonnaise. Fats and Oils  Foods low in purines: All types, except gravies and sauces made with meat.  Foods high in purines: Gravies and sauces made with meat. Other Foods  Foods low in purines: Sugars, sweets, gelatin. Cake. Soups made without meat.  Foods moderate in purines: Meat-based or fish-based soups, broths, or bouillons. Foods and drinks sweetened with high-fructose corn syrup.  Foods high in purines:  High-fat desserts (such as ice cream, cookies, cakes, pies, doughnuts, and chocolate). Contact your dietitian for more information on foods that are not listed here.   This information is not intended to replace advice given to you by your health care provider. Make sure you discuss any questions you have with your health care provider.   Document Released: 02/11/2011 Document Revised: 10/22/2013 Document Reviewed: 09/23/2013 Elsevier Interactive Patient Education 2016 [employer].      Orders Placed This Encounter  Procedures  . Body fluid culture  . Gram stain  . Synovial cell count + diff, w/ crystals  . Glucose, synovial fluid    Signed,  Annamae Shivley T. Italo Banton, MD   Patient's Medications  New Prescriptions   No medications on file  Previous Medications   ALPRAZOLAM (XANAX) 1 MG TABLET    Take 0.5 mg by mouth 2 (two) times daily as needed for anxiety.    COLCHICINE 0.6 MG CAPS    Take 1 tablet by mouth 2 (two) times daily.   CYCLOBENZAPRINE (FLEXERIL) 10 MG TABLET    Take 1/2 to1 pill up tid prn for headache or muscle pain   DICLOFENAC (VOLTAREN) 75 MG EC TABLET    Take 1 tablet (75 mg total) by mouth 2 (two) times daily.   DICYCLOMINE (BENTYL) 10 MG CAPSULE    Take 1 capsule (10 mg total) by mouth daily.   ETONOGESTREL (IMPLANON) 68 MG IMPL IMPLANT    Inject 1 each into the skin continuous.  Reported on 01/27/2016   FLUTICASONE (FLONASE) 50 MCG/ACT NASAL SPRAY    Place 2 sprays into both nostrils daily.   GABAPENTIN (NEURONTIN) 100 MG CAPSULE    Take 1 capsule (100 mg total) by mouth 2 (two) times daily.   GABAPENTIN (NEURONTIN) 300 MG CAPSULE    Take 1 capsule (300 mg total) by mouth 3 (three) times daily.   HYDROCODONE-ACETAMINOPHEN (NORCO/VICODIN) 5-325 MG PER TABLET    Take 1 tablet by mouth every 6 (six) hours as needed for moderate pain.   INDOMETHACIN (INDOCIN) 50 MG CAPSULE    Take 1 capsule (50 mg total) by mouth 3 (three) times daily with meals. As needed for gout pain   MECLIZINE HCL PO    Take 1 tablet by mouth at bedtime.   MIRABEGRON (MYRBETRIQ PO)    Take by mouth daily.   OMEPRAZOLE (PRILOSEC) 40 MG CAPSULE    Take 1 capsule (40 mg total) by mouth daily.   ONDANSETRON (ZOFRAN ODT) 4 MG DISINTEGRATING TABLET    Take 1 tablet (4 mg total) by mouth every 8 (eight) hours as needed for nausea or vomiting.   PANTOPRAZOLE (PROTONIX) 40 MG TABLET    Take 30 minutes before breakfast   PAROXETINE (PAXIL) 40 MG TABLET    Take 1 tablet (40 mg total) by mouth daily.   PREDNISONE (DELTASONE) 10 MG TABLET    Take 1 tablet (10 mg total) by mouth daily with breakfast. 50 mg (5 tablets) daily x 3 days, then 40 mg (4 tablets) daily x 3 days, then 30 mg (3 tablets) daily x 3 days, then 20 mg (2 tablets) daily x 3 days, then 10 mg (1 tablet) daily x 3 days.   PROMETHAZINE (PHENERGAN) 25 MG TABLET    Take 1 tablet (25 mg total) by mouth every 8 (eight) hours as needed for nausea or vomiting.   TETRAHYDROZOLINE 0.05 % OPHTHALMIC SOLUTION    Place 1 drop into both eyes daily as needed (for  dry eyes).   TRAMADOL (ULTRAM) 50 MG TABLET    Take 1 tablet (50 mg total) by mouth every 8 (eight) hours as needed.   TRIAMTERENE-HYDROCHLOROTHIAZIDE (DYAZIDE) 37.5-25 MG CAPSULE    Take 1 capsule by mouth daily.   VALACYCLOVIR (VALTREX) 500 MG TABLET    Take 1 tablet (500 mg total) by mouth daily.  Modified  Medications   No medications on file  Discontinued Medications   No medications on file

## 2016-02-10 NOTE — Patient Instructions (Addendum)
Colcrys, 1 tablet twice a day Indomethacin 50 mg, 1 tablet three times a day   Low-Purine Diet Purines are compounds that affect the level of uric acid in your body. A low-purine diet is a diet that is low in purines. Eating a low-purine diet can prevent the level of uric acid in your body from getting too high and causing gout or kidney stones or both. WHAT DO I NEED TO KNOW ABOUT THIS DIET?  Choose low-purine foods. Examples of low-purine foods are listed in the next section.  Drink plenty of fluids, especially water. Fluids can help remove uric acid from your body. Try to drink 8-16 cups (1.9-3.8 L) a day.  Limit foods high in fat, especially saturated fat, as fat makes it harder for the body to get rid of uric acid. Foods high in saturated fat include pizza, cheese, ice cream, whole milk, fried foods, and gravies. Choose foods that are lower in fat and lean sources of protein. Use olive oil when cooking as it contains healthy fats that are not high in saturated fat.  Limit alcohol. Alcohol interferes with the elimination of uric acid from your body. If you are having a gout attack, avoid all alcohol.  Keep in mind that different people's bodies react differently to different foods. You will probably learn over time which foods do or do not affect you. If you discover that a food tends to cause your gout to flare up, avoid eating that food. You can more freely enjoy foods that do not cause problems. If you have any questions about a food item, talk to your dietitian or health care provider. WHICH FOODS ARE LOW, MODERATE, AND HIGH IN PURINES? The following is a list of foods that are low, moderate, and high in purines. You can eat any amount of the foods that are low in purines. You may be able to have small amounts of foods that are moderate in purines. Ask your health care provider how much of a food moderate in purines you can have. Avoid foods high in purines. Grains  Foods low in purines:  Enriched white bread, pasta, rice, cake, cornbread, popcorn.  Foods moderate in purines: Whole-grain breads and cereals, wheat germ, bran, oatmeal. Uncooked oatmeal. Dry wheat bran or wheat germ.  Foods high in purines: Pancakes, [place] toast, biscuits, muffins. Vegetables  Foods low in purines: All vegetables, except those that are moderate in purines.  Foods moderate in purines: Asparagus, cauliflower, spinach, mushrooms, green peas. Fruits  All fruits are low in purines. Meats and other Protein Foods  Foods low in purines: Eggs, nuts, peanut butter.  Foods moderate in purines: 80-90% lean beef, lamb, veal, pork, poultry, fish, eggs, peanut butter, nuts. Crab, lobster, oysters, and shrimp. Cooked dried beans, peas, and lentils.  Foods high in purines: Anchovies, sardines, herring, mussels, tuna, codfish, scallops, trout, and haddock. Dana Greer. Organ meats (such as liver or kidney). Tripe. Game meat. Goose. Sweetbreads. Dairy  All dairy foods are low in purines. Low-fat and fat-free dairy products are best because they are low in saturated fat. Beverages  Drinks low in purines: Water, carbonated beverages, tea, coffee, cocoa.  Drinks moderate in purines: Soft drinks and other drinks sweetened with high-fructose corn syrup. Juices. To find whether a food or drink is sweetened with high-fructose corn syrup, look at the ingredients list.  Drinks high in purines: Alcoholic beverages (such as beer). Condiments  Foods low in purines: Salt, herbs, olives, pickles, relishes, vinegar.  Foods moderate in  purines: Butter, margarine, oils, mayonnaise. Fats and Oils  Foods low in purines: All types, except gravies and sauces made with meat.  Foods high in purines: Gravies and sauces made with meat. Other Foods  Foods low in purines: Sugars, sweets, gelatin. Cake. Soups made without meat.  Foods moderate in purines: Meat-based or fish-based soups, broths, or bouillons. Foods and drinks  sweetened with high-fructose corn syrup.  Foods high in purines: High-fat desserts (such as ice cream, cookies, cakes, pies, doughnuts, and chocolate). Contact your dietitian for more information on foods that are not listed here.   This information is not intended to replace advice given to you by your health care provider. Make sure you discuss any questions you have with your health care provider.   Document Released: 02/11/2011 Document Revised: 10/22/2013 Document Reviewed: 09/23/2013 Elsevier Interactive Patient Education [employer].

## 2016-02-11 LAB — GLUCOSE, SYNOVIAL FLUID: Glucose, Synovial Fluid: 83 mg/dL

## 2016-02-11 LAB — SYNOVIAL CELL COUNT + DIFF, W/ CRYSTALS
BASOPHILS, %: 0 %
EOSINOPHILS-SYNOVIAL: 0 % (ref 0–2)
LYMPHOCYTES-SYNOVIAL FLD: 6 % (ref 0–74)
Monocyte/Macrophage: 9 % (ref 0–69)
NEUTROPHIL, SYNOVIAL: 85 % — AB (ref 0–24)
Synoviocytes, %: 0 % (ref 0–15)
WBC, Synovial: 7335 cells/uL — ABNORMAL HIGH (ref <150)

## 2016-02-14 LAB — BODY FLUID CULTURE
Gram Stain: NONE SEEN
Organism ID, Bacteria: NO GROWTH

## 2016-02-15 ENCOUNTER — Encounter: Payer: Self-pay | Admitting: Family Medicine

## 2016-03-01 DIAGNOSIS — R112 Nausea with vomiting, unspecified: Secondary | ICD-10-CM | POA: Diagnosis not present

## 2016-03-06 ENCOUNTER — Emergency Department (HOSPITAL_COMMUNITY): Payer: Medicare Other

## 2016-03-06 ENCOUNTER — Other Ambulatory Visit: Payer: Self-pay

## 2016-03-06 ENCOUNTER — Encounter (HOSPITAL_COMMUNITY): Payer: Self-pay | Admitting: Emergency Medicine

## 2016-03-06 ENCOUNTER — Emergency Department (HOSPITAL_COMMUNITY)
Admission: EM | Admit: 2016-03-06 | Discharge: 2016-03-06 | Disposition: A | Discharge status: Home / Self Care | Payer: Medicare Other | Attending: Emergency Medicine | Admitting: Emergency Medicine

## 2016-03-06 DIAGNOSIS — Z862 Personal history of diseases of the blood and blood-forming organs and certain disorders involving the immune mechanism: Secondary | ICD-10-CM | POA: Diagnosis not present

## 2016-03-06 DIAGNOSIS — R112 Nausea with vomiting, unspecified: Secondary | ICD-10-CM | POA: Diagnosis not present

## 2016-03-06 DIAGNOSIS — Z7951 Long term (current) use of inhaled steroids: Secondary | ICD-10-CM | POA: Diagnosis not present

## 2016-03-06 DIAGNOSIS — Z8673 Personal history of transient ischemic attack (TIA), and cerebral infarction without residual deficits: Secondary | ICD-10-CM | POA: Diagnosis not present

## 2016-03-06 DIAGNOSIS — R0789 Other chest pain: Secondary | ICD-10-CM | POA: Insufficient documentation

## 2016-03-06 DIAGNOSIS — R531 Weakness: Secondary | ICD-10-CM | POA: Insufficient documentation

## 2016-03-06 DIAGNOSIS — Z79899 Other long term (current) drug therapy: Secondary | ICD-10-CM | POA: Insufficient documentation

## 2016-03-06 DIAGNOSIS — R079 Chest pain, unspecified: Secondary | ICD-10-CM | POA: Diagnosis present

## 2016-03-06 DIAGNOSIS — Z791 Long term (current) use of non-steroidal anti-inflammatories (NSAID): Secondary | ICD-10-CM | POA: Diagnosis not present

## 2016-03-06 DIAGNOSIS — M1990 Unspecified osteoarthritis, unspecified site: Secondary | ICD-10-CM | POA: Diagnosis not present

## 2016-03-06 DIAGNOSIS — F329 Major depressive disorder, single episode, unspecified: Secondary | ICD-10-CM | POA: Diagnosis not present

## 2016-03-06 DIAGNOSIS — R05 Cough: Secondary | ICD-10-CM | POA: Diagnosis not present

## 2016-03-06 DIAGNOSIS — N289 Disorder of kidney and ureter, unspecified: Secondary | ICD-10-CM | POA: Diagnosis not present

## 2016-03-06 DIAGNOSIS — R000 Tachycardia, unspecified: Secondary | ICD-10-CM | POA: Insufficient documentation

## 2016-03-06 DIAGNOSIS — Z880 Allergy status to penicillin: Secondary | ICD-10-CM | POA: Insufficient documentation

## 2016-03-06 LAB — CBC WITH DIFFERENTIAL/PLATELET
BASOS PCT: 0 %
Basophils Absolute: 0 10*3/uL (ref 0.0–0.1)
EOS ABS: 0 10*3/uL (ref 0.0–0.7)
EOS PCT: 0 %
HCT: 44.5 % (ref 36.0–46.0)
Hemoglobin: 15.6 g/dL — ABNORMAL HIGH (ref 12.0–15.0)
LYMPHS ABS: 2.5 10*3/uL (ref 0.7–4.0)
LYMPHS PCT: 33 %
MCH: 33 pg (ref 26.0–34.0)
MCHC: 35.1 g/dL (ref 30.0–36.0)
MCV: 94.1 fL (ref 78.0–100.0)
MONO ABS: 0.5 10*3/uL (ref 0.1–1.0)
MONOS PCT: 6 %
Neutro Abs: 4.5 10*3/uL (ref 1.7–7.7)
Neutrophils Relative %: 61 %
Platelets: 205 10*3/uL (ref 150–400)
RBC: 4.73 MIL/uL (ref 3.87–5.11)
RDW: 12.6 % (ref 11.5–15.5)
WBC: 7.4 10*3/uL (ref 4.0–10.5)

## 2016-03-06 LAB — COMPREHENSIVE METABOLIC PANEL
ALBUMIN: 4.1 g/dL (ref 3.5–5.0)
ALT: 28 U/L (ref 14–54)
AST: 24 U/L (ref 15–41)
Alkaline Phosphatase: 48 U/L (ref 38–126)
Anion gap: 14 (ref 5–15)
BUN: 14 mg/dL (ref 6–20)
CHLORIDE: 100 mmol/L — AB (ref 101–111)
CO2: 22 mmol/L (ref 22–32)
Calcium: 9.7 mg/dL (ref 8.9–10.3)
Creatinine, Ser: 1.53 mg/dL — ABNORMAL HIGH (ref 0.44–1.00)
GFR calc Af Amer: 46 mL/min — ABNORMAL LOW (ref >60)
GFR calc non Af Amer: 40 mL/min — ABNORMAL LOW (ref >60)
GLUCOSE: 128 mg/dL — AB (ref 65–99)
POTASSIUM: 3.8 mmol/L (ref 3.5–5.1)
Sodium: 136 mmol/L (ref 135–145)
Total Bilirubin: 1.1 mg/dL (ref 0.3–1.2)
Total Protein: 7.4 g/dL (ref 6.5–8.1)

## 2016-03-06 LAB — I-STAT TROPONIN, ED: TROPONIN I, POC: 0 ng/mL (ref 0.00–0.08)

## 2016-03-06 MED ORDER — ONDANSETRON 4 MG PO TBDP
8.0000 mg | ORAL_TABLET | Freq: Once | ORAL | Status: AC
  Administered 2016-03-06: 8 mg via ORAL
  Filled 2016-03-06: qty 2

## 2016-03-06 MED ORDER — SODIUM CHLORIDE 0.9 % IV BOLUS (SEPSIS)
1000.0000 mL | Freq: Once | INTRAVENOUS | Status: AC
  Administered 2016-03-06: 1000 mL via INTRAVENOUS

## 2016-03-06 NOTE — ED Provider Notes (Signed)
CSN: [id number]     Arrival date & time 03/06/16  1708 History   First MD Initiated Contact with Patient 03/06/16 1715     Chief Complaint  Patient presents with  . Chest Pain     (Consider location/radiation/quality/duration/timing/severity/associated sxs/prior Treatment) HPI Complains of anterior chest pain rating to back onset 03/01/2016 accompanied by cough and vomiting. She states she hasn't eaten in a week however has been drinking water without vomiting. Other associated symptoms include generalized weakness. Patient was seen in emergency department at [facility] on 03/01/2016 for same complaint, treated and released was treated with Zofran and administered oral potassium. Her last bowel movement was approximately a week ago. No blood parenchyma. No fever. Chest pain is constant, nonexertional. No other associated symptoms. She was treated by EMS with 1 subungual nitroglycerin prior to coming here  she denies nausea at present. Past Medical History  Diagnosis Date  . Arthritis   . Depression   . Headache(784.0)   . HSV-2 infection   . Thrombocytopenia ([place]) 10/01/2012  . Immune deficiency disorder ([place])   . Stroke [facility])   Fibromyalgia Mood disorder, anxiety Past Surgical History  Procedure Laterality Date  . Partial hysterectomy  2012    due to fibroid  . Wisdom tooth extraction  1995  . Cesarean section  2008    x1  . Abdominal hysterectomy    . Upper gastrointestinal endoscopy     Family History  Problem Relation Age of Onset  . Diabetes Father   . Breast cancer Paternal Aunt   . Colon cancer Paternal Grandmother    Social History  Substance Use Topics  . Smoking status: Never Smoker   . Smokeless tobacco: Never Used     Comment: Pt states she smokes.....but "not cigarettes"  . Alcohol Use: 1.2 oz/week    2 Glasses of wine per week     Comment: occ  Positive marijuana use OB History    Gravida Para Term Preterm AB TAB SAB Ectopic Multiple Living   3         3       Obstetric Comments   Pt states that she has had a lot of miscarriages. Do not know how many     Review of Systems  HENT: Negative.   Respiratory: Positive for cough.   Cardiovascular: Positive for chest pain.  Gastrointestinal: Positive for nausea and vomiting.  Musculoskeletal: Negative.   Skin: Negative.   Neurological: Positive for weakness.       Generalized weakness  Psychiatric/Behavioral: Negative.   All other systems reviewed and are negative.     Allergies  Trazodone and nefazodone; Amoxicillin; and Carafate  Home Medications   Prior to Admission medications   Medication Sig Start Date End Date Taking? Authorizing Provider  ALPRAZolam Duanne Moron) 1 MG tablet Take 0.5 mg by mouth 2 (two) times daily as needed for anxiety.     Historical Provider, MD  Colchicine 0.6 MG CAPS Take 1 tablet by mouth 2 (two) times daily. 02/04/16   Owens Loffler, MD  cyclobenzaprine (FLEXERIL) 10 MG tablet Take 1/2 to1 pill up tid prn for headache or muscle pain 02/17/15   Abner Greenspan, MD  diclofenac (VOLTAREN) 75 MG EC tablet Take 1 tablet (75 mg total) by mouth 2 (two) times daily. 03/25/15   Owens Loffler, MD  dicyclomine (BENTYL) 10 MG capsule Take 1 capsule (10 mg total) by mouth daily. 10/05/15   Gatha Mayer, MD  etonogestrel (IMPLANON) 68 MG IMPL  implant Inject 1 each into the skin continuous. Reported on 01/27/2016    Historical Provider, MD  fluticasone (FLONASE) 50 MCG/ACT nasal spray Place 2 sprays into both nostrils daily. 04/07/14   Abner Greenspan, MD  gabapentin (NEURONTIN) 100 MG capsule Take 1 capsule (100 mg total) by mouth 2 (two) times daily. 03/16/15   Abner Greenspan, MD  gabapentin (NEURONTIN) 300 MG capsule Take 1 capsule (300 mg total) by mouth 3 (three) times daily. 11/25/15   Abner Greenspan, MD  HYDROcodone-acetaminophen (NORCO/VICODIN) 5-325 MG per tablet Take 1 tablet by mouth every 6 (six) hours as needed for moderate pain. 04/03/15   Jearld Fenton, NP  indomethacin (INDOCIN)  50 MG capsule Take 1 capsule (50 mg total) by mouth 3 (three) times daily with meals. As needed for gout pain 01/13/16   Abner Greenspan, MD  MECLIZINE HCL PO Take 1 tablet by mouth at bedtime.    Historical Provider, MD  Mirabegron (MYRBETRIQ PO) Take by mouth daily.    Historical Provider, MD  omeprazole (PRILOSEC) 40 MG capsule Take 1 capsule (40 mg total) by mouth daily. 11/29/14   Quintella Reichert, MD  ondansetron (ZOFRAN ODT) 4 MG disintegrating tablet Take 1 tablet (4 mg total) by mouth every 8 (eight) hours as needed for nausea or vomiting. 01/13/16   Abner Greenspan, MD  pantoprazole (PROTONIX) 40 MG tablet Take 30 minutes before breakfast 05/14/15   Lori P Hvozdovic, PA-C  PARoxetine (PAXIL) 40 MG tablet Take 1 tablet (40 mg total) by mouth daily. 05/26/14   Abner Greenspan, MD  predniSONE (DELTASONE) 10 MG tablet Take 1 tablet (10 mg total) by mouth daily with breakfast. 50 mg (5 tablets) daily x 3 days, then 40 mg (4 tablets) daily x 3 days, then 30 mg (3 tablets) daily x 3 days, then 20 mg (2 tablets) daily x 3 days, then 10 mg (1 tablet) daily x 3 days. 01/27/16   Coral Spikes, DO  promethazine (PHENERGAN) 25 MG tablet Take 1 tablet (25 mg total) by mouth every 8 (eight) hours as needed for nausea or vomiting. 01/14/16   Midge Minium, MD  tetrahydrozoline 0.05 % ophthalmic solution Place 1 drop into both eyes daily as needed (for dry eyes).    Historical Provider, MD  traMADol (ULTRAM) 50 MG tablet Take 1 tablet (50 mg total) by mouth every 8 (eight) hours as needed. 01/28/16   Abner Greenspan, MD  triamterene-hydrochlorothiazide (DYAZIDE) 37.5-25 MG capsule Take 1 capsule by mouth daily. 10/19/15   Historical Provider, MD  valACYclovir (VALTREX) 500 MG tablet Take 1 tablet (500 mg total) by mouth daily. 11/04/15   Marne A [name], MD   BP 108/91 mmHg  Pulse 103  Temp(Src) 98.6 F (37 C) (Oral)  Resp 16  Ht 5\' 6"  (1.676 m)  Wt 207 lb (93.895 kg)  BMI 33.43 kg/m2  SpO2 100% Physical Exam   Constitutional: She appears well-developed and well-nourished.  HENT:  Head: Normocephalic and atraumatic.  Eyes: Conjunctivae are normal. Pupils are equal, round, and reactive to light.  Neck: Neck supple. No tracheal deviation present. No thyromegaly present.  Cardiovascular: Regular rhythm.   No murmur heard. Mildly tachycardic  Pulmonary/Chest: Effort normal and breath sounds normal.  Abdominal: Soft. Bowel sounds are normal. She exhibits no distension. There is no tenderness.  Musculoskeletal: Normal range of motion. She exhibits no edema or tenderness.  Neurological: She is alert. Coordination normal.  Skin: Skin is warm  and dry. No rash noted.  Psychiatric: She has a normal mood and affect.  Nursing note and vitals reviewed.   ED Course  Procedures (including critical care time) Labs Review Labs Reviewed  COMPREHENSIVE METABOLIC PANEL  CBC WITH DIFFERENTIAL/PLATELET  I-STAT TROPOININ, ED    Imaging Review No results found. I have personally reviewed and evaluated these images and lab results as part of my medical decision-making.   EKG Interpretation None     ED ECG REPORT   Date: 03/06/2016  Rate: 110  Rhythm: sinus tachycardia  QRS Axis: normal  Intervals: normal  ST/T Wave abnormalities: nonspecific T wave changes  Conduction Disutrbances:none  Narrative Interpretation:   Old EKG Reviewed: No significant change from 11/28/2014 other than rate increased  I have personally reviewed the EKG tracing and agree with the computerized printout as noted.  8:45 PM patient feels much improved. She complains only of mild headache. She is able to drink without difficulty .She is alert and in no distress. Denies any nausea. X-rays viewed by me Results for orders placed or performed during the hospital encounter of 03/06/16  Comprehensive metabolic panel  Result Value Ref Range   Sodium 136 135 - 145 mmol/L   Potassium 3.8 3.5 - 5.1 mmol/L   Chloride 100 (L) 101 -  111 mmol/L   CO2 22 22 - 32 mmol/L   Glucose, Bld 128 (H) 65 - 99 mg/dL   BUN 14 6 - 20 mg/dL   Creatinine, Ser 1.53 (H) 0.44 - 1.00 mg/dL   Calcium 9.7 8.9 - 10.3 mg/dL   Total Protein 7.4 6.5 - 8.1 g/dL   Albumin 4.1 3.5 - 5.0 g/dL   AST 24 15 - 41 U/L   ALT 28 14 - 54 U/L   Alkaline Phosphatase 48 38 - 126 U/L   Total Bilirubin 1.1 0.3 - 1.2 mg/dL   GFR calc non Af Amer 40 (L) >60 mL/min   GFR calc Af Amer 46 (L) >60 mL/min   Anion gap 14 5 - 15  CBC with Differential/Platelet  Result Value Ref Range   WBC 7.4 4.0 - 10.5 K/uL   RBC 4.73 3.87 - 5.11 MIL/uL   Hemoglobin 15.6 (H) 12.0 - 15.0 g/dL   HCT 44.5 36.0 - 46.0 %   MCV 94.1 78.0 - 100.0 fL   MCH 33.0 26.0 - 34.0 pg   MCHC 35.1 30.0 - 36.0 g/dL   RDW 12.6 11.5 - 15.5 %   Platelets 205 150 - 400 K/uL   Neutrophils Relative % 61 %   Neutro Abs 4.5 1.7 - 7.7 K/uL   Lymphocytes Relative 33 %   Lymphs Abs 2.5 0.7 - 4.0 K/uL   Monocytes Relative 6 %   Monocytes Absolute 0.5 0.1 - 1.0 K/uL   Eosinophils Relative 0 %   Eosinophils Absolute 0.0 0.0 - 0.7 K/uL   Basophils Relative 0 %   Basophils Absolute 0.0 0.0 - 0.1 K/uL  I-stat troponin, ED  Result Value Ref Range   Troponin i, poc 0.00 0.00 - 0.08 ng/mL   Comment 3           Dg Abd Acute W/chest  03/06/2016  CLINICAL DATA:  45 year old female with chest pain and no bowel movement x1 week. EXAM: DG ABDOMEN ACUTE W/ 1V CHEST COMPARISON:  Abdominal CT dated 04/28/2015 FINDINGS: Single frontal view of the chest demonstrate clear lungs with sharp costophrenic angles. There is no pneumothorax. The cardiac silhouette is within normal limits.  There is scoliosis of the thoracolumbar spine. No acute osseous pathology identified. There is no evidence of bowel obstruction. No free air no radiopaque calculi identified. No acute osseous pathology. IMPRESSION: No acute cardiopulmonary process. No evidence of bowel obstruction or free air. Electronically Signed   By: Anner Crete M.D.    On: 03/06/2016 18:24    MDM  Cardiac risk factors none. Heart score equals 1 Final diagnoses:  None  Plan follow-up with Dr.[name] as outpatient Diagnosis #1 atypical chest pain #2 mild renal insufficiency #3 nausea and vomiting      Orlie Dakin, MD 03/06/16 2053

## 2016-03-06 NOTE — ED Notes (Signed)
Pt given juice for a PO challenge to see if she can hold fluids without becoming nauseated.

## 2016-03-06 NOTE — ED Notes (Signed)
MD at bedside. 

## 2016-03-06 NOTE — Discharge Instructions (Signed)
Nonspecific Chest Pain Call Dr. Marliss Coots office tomorrow to schedule the next available office appointment. Return if you feel worse for any reason It is often hard to find the cause of chest pain. There is always a chance that your pain could be related to something serious, such as a heart attack or a blood clot in your lungs. Chest pain can also be caused by conditions that are not life-threatening. If you have chest pain, it is very important to follow up with your doctor.  HOME CARE  If you were prescribed an antibiotic medicine, finish it all even if you start to feel better.  Avoid any activities that cause chest pain.  Do not use any tobacco products, including cigarettes, chewing tobacco, or electronic cigarettes. If you need help quitting, ask your doctor.  Do not drink alcohol.  Take medicines only as told by your doctor.  Keep all follow-up visits as told by your doctor. This is important. This includes any further testing if your chest pain does not go away.  Your doctor may tell you to keep your head raised (elevated) while you sleep.  Make lifestyle changes as told by your doctor. These may include:  Getting regular exercise. Ask your doctor to suggest some activities that are safe for you.  Eating a heart-healthy diet. Your doctor or a diet specialist (dietitian) can help you to learn healthy eating options.  Maintaining a healthy weight.  Managing diabetes, if necessary.  Reducing stress. GET HELP IF:  Your chest pain does not go away, even after treatment.  You have a rash with blisters on your chest.  You have a fever. GET HELP RIGHT AWAY IF:  Your chest pain is worse.  You have an increasing cough, or you cough up blood.  You have severe belly (abdominal) pain.  You feel extremely weak.  You pass out (faint).  You have chills.  You have sudden, unexplained chest discomfort.  You have sudden, unexplained discomfort in your arms, back, neck, or  jaw.  You have shortness of breath at any time.  You suddenly start to sweat, or your skin gets clammy.  You feel nauseous.  You vomit.  You suddenly feel light-headed or dizzy.  Your heart begins to beat quickly, or it feels like it is skipping beats. These symptoms may be an emergency. Do not wait to see if the symptoms will go away. Get medical help right away. Call your local emergency services (911 in the [place] Do not drive yourself to the hospital.   This information is not intended to replace advice given to you by your health care provider. Make sure you discuss any questions you have with your health care provider.   Document Released: 04/04/2008 Document Revised: 11/07/2014 Document Reviewed: 05/23/2014 Elsevier Interactive Patient Education [employer].

## 2016-03-06 NOTE — ED Notes (Signed)
Pt states for the last 4 days she has had central sharp chest pain with nausea vomiting. Pt states the nausea and vomiting has been ongoing for "weeks" was recently seen at "[facility]" for n/v and dehydration. Pt states today she seems "weak all over and feels dehydrated."

## 2016-03-07 ENCOUNTER — Telehealth: Payer: Self-pay

## 2016-03-07 NOTE — Telephone Encounter (Signed)
PLEASE NOTE: All timestamps contained within this report are represented as [place] Standard Time. CONFIDENTIALTY NOTICE: This fax transmission is intended only for the addressee. It contains information that is legally privileged, confidential or otherwise protected from use or disclosure. If you are not the intended recipient, you are strictly prohibited from reviewing, disclosing, copying using or disseminating any of this information or taking any action in reliance on or regarding this information. If you have received this fax in error, please notify us immediately by telephone so that we can arrange for its return to [place]. Phone: [phone], Toll-Free: [phone], Fax: [phone] Page: 1 of 2 Call Id: [id number] [place] Patient Name: Dana Greer Gender: Unknown DOB: 1971/05/06 Age: 45 Y 52 M 18 D Return Phone Number: [phone] (Primary) Address: City/State/Zip: [place] Client [place] Night - Client Client Site [place] Physician [name], Roque Lias - MD Contact Type Call Who Is Calling Patient / Member / Family / Caregiver Call Type Triage / Clinical Relationship To Patient Self Return Phone Number [phone] (Primary) Chief Complaint Weakness, Generalized Reason for Call Symptomatic / Request for [place] states she was in [place] and she is still not eating and feeling weak. PreDisposition Go to ED Translation No Nurse Assessment Nurse: Germain Osgood, RN, Opal Sidles Date/Time [name] Time): 03/06/2016 3:20:58 PM Confirm and document reason for call. If symptomatic, describe symptoms. You must click the next button to save text entered. ---Caller reports she was in Hospital in [place] on 05/02 Found to have elevated Kidney fx Neutrophils High Had been on Steroids, Still feeling weak and dehydrated and is  not able to eat or drink. Was having some chest pain, in middle front to back. is either cold or hot. Temp 99.? Has the patient traveled out of the country within the last 30 days? ---No Does the patient have any new or worsening symptoms? ---Yes Will a triage be completed? ---Yes Related visit to physician within the last 2 weeks? ---Yes Does the PT have any chronic conditions? (i.e. diabetes, asthma, etc.) ---Yes List chronic conditions. ---Gout ( pseudogout in knee) indomethacin colchisine Is the patient pregnant or possibly pregnant? (Ask all females between the ages of 46-55) ---No Is this a behavioral health or substance abuse call? ---No Guidelines Guideline Title Affirmed Question Affirmed Notes Nurse Date/Time (Eastern Time) Weakness (Generalized) and Fatigue [1] SEVERE weakness (i.e., unable to walk or barely able to walk, requires support) Germain Osgood, RN, Opal Sidles 03/06/2016 3:24:58 PM PLEASE NOTE: All timestamps contained within this report are represented as [place] Standard Time. CONFIDENTIALTY NOTICE: This fax transmission is intended only for the addressee. It contains information that is legally privileged, confidential or otherwise protected from use or disclosure. If you are not the intended recipient, you are strictly prohibited from reviewing, disclosing, copying using or disseminating any of this information or taking any action in reliance on or regarding this information. If you have received this fax in error, please notify us immediately by telephone so that we can arrange for its return to [place]. Phone: [phone], Toll-Free: [phone], Fax: [phone] Page: 2 of 2 Call Id: [id number] Guidelines Guideline Title Affirmed Question Affirmed Notes Nurse Date/Time [name] Time) AND [2] new onset or worsening Disp. Time [name] Time) Disposition Final User 03/06/2016 3:14:38 PM Send To RN Personal Honor Loh 03/06/2016 3:27:28 PM Call Completed Germain Osgood, RN,  Jane 03/06/2016 3:27:51 PM 911  Outcome Documentation Germain Osgood, RN, Opal Sidles Reason: Is going ti have another adult take her to ED. 03/06/2016 3:27:01 PM Call EMS 911 Now Yes Germain Osgood, RN, Irving Copas Understands: Yes Disagree/Comply: Disagree Disagree/Comply Reason: Disagree with instructions Care Advice Given Per Guideline CALL EMS 911 NOW: Immediate medical attention is needed. You need to hang up and call 911 (or an ambulance). Psychologist, forensic Discretion: I'll call you back in a few minutes to be sure you were able to reach them.) CARE ADVICE given per Weakness and Fatigue (Adult) guideline. * Please bring a list of your current medicines when you go to the Emergency Department (ER). BRING MEDICINES: * It is also a good idea to bring the pill bottles too. This will help the doctor to make certain you are taking the right medicines and the right dose. Referrals [facility] - ED

## 2016-03-07 NOTE — Telephone Encounter (Signed)
Agreed I will see her then

## 2016-03-07 NOTE — Telephone Encounter (Signed)
Per chart review tab pt went to [facility] ED on 03/06/16. Pt having H/A (pain level 9) and CP (pain level 7-8). No dizziness,SOB, vision changes or N&V. Pt scheduled 30 min ED f/u with Dr Glori Bickers on 03/08/16 at 11:45, if pt condition changes or worsens prior to appt pt will call [facility] or go to [facility] ED.

## 2016-03-08 ENCOUNTER — Ambulatory Visit (INDEPENDENT_AMBULATORY_CARE_PROVIDER_SITE_OTHER): Payer: Medicare Other | Admitting: Family Medicine

## 2016-03-08 ENCOUNTER — Encounter: Payer: Self-pay | Admitting: Family Medicine

## 2016-03-08 VITALS — BP 128/80 | HR 101 | Temp 98.7°F | Ht 62.5 in | Wt 194.5 lb

## 2016-03-08 DIAGNOSIS — R112 Nausea with vomiting, unspecified: Secondary | ICD-10-CM | POA: Diagnosis not present

## 2016-03-08 DIAGNOSIS — E860 Dehydration: Secondary | ICD-10-CM

## 2016-03-08 DIAGNOSIS — G43109 Migraine with aura, not intractable, without status migrainosus: Secondary | ICD-10-CM

## 2016-03-08 DIAGNOSIS — N289 Disorder of kidney and ureter, unspecified: Secondary | ICD-10-CM | POA: Diagnosis not present

## 2016-03-08 LAB — BASIC METABOLIC PANEL
BUN: 12 mg/dL (ref 6–23)
CALCIUM: 10 mg/dL (ref 8.4–10.5)
CO2: 30 meq/L (ref 19–32)
CREATININE: 1.45 mg/dL — AB (ref 0.40–1.20)
Chloride: 97 mEq/L (ref 96–112)
GFR: 50.12 mL/min — ABNORMAL LOW (ref >60.00)
Glucose, Bld: 126 mg/dL — ABNORMAL HIGH (ref 70–99)
Potassium: 3.8 mEq/L (ref 3.5–5.1)
Sodium: 137 mEq/L (ref 135–145)

## 2016-03-08 MED ORDER — MIRABEGRON ER 50 MG PO TB24: 50.0000 mg | ORAL_TABLET | Freq: Every day | ORAL | Status: DC

## 2016-03-08 NOTE — Assessment & Plan Note (Signed)
Re check today Presumed from dehydration from n/v (? Migraine) and also prev use of indocin Will try to avoid nsaid Gradually re hydrate  Watch closely

## 2016-03-08 NOTE — Assessment & Plan Note (Signed)
Lab today Improved with inc fluids for several days Disc gradual rehydration  Rev ED records from [facility] and then [facility] Bmp today Will try to control n/v

## 2016-03-08 NOTE — Assessment & Plan Note (Signed)
Ongoing - pt gets n/v with this (and not always headache- sometimes just dizziness and n/v) Has seen neuro in the past  Rev ENT note  Disc poss that her HRT was worsening it - pt plans to speak to her gyn about this (her hysterectomy left enough of uterus and cervix to cause bleeding which is why she needs the implanon)- interested in surgery to totally remove cervix and uterus so she could stop the hormonal tx

## 2016-03-08 NOTE — Assessment & Plan Note (Signed)
Had GI w/u Suspect due to vertiginous migraine  Disc use of phenergan and zofran She will meet with gyn re: getting off hrt Lab today-has been dehydrated and inc cr  Disc gradual hydration- adv to brat diet as tolerated Consider H2 blocker to help with gastritis symptoms presumed  From empty stomach  Continue to monitor

## 2016-03-08 NOTE — Patient Instructions (Addendum)
Labs today for kidney function  Keep hydrating gradually - keep increasing from sips to more constant drinking as tolerated Then gradually advance to [employer] diet (small amounts with gentle advancement)  zofran for nausea/ phenergan for nausea and migraine Zantac otc is helpful for stomach discomfort and acid (may also help histamine reactions responsible for headache)  Try to get regular  Take care of yourself -for migraine Talk to your gyn about steps to take to get off hormones ( or see if that is what is causing headaches and nausea and vomiting)   Indocin (for gout)- may upset stomach and it is hard on stomach -keep this in mind

## 2016-03-08 NOTE — Progress Notes (Signed)
Subjective:    Patient ID: Dana Greer, female    DOB: [birth year], 45 y.o.   MRN: [id number]  HPI Here for f/u of ED visit  Was first seen at [facility] (traveling) for n/v 03/01/16 and given zofran and K for n/v It was her usual episode of n/v (that she had neg GI w/u for - ? If vertigo or migraine)  [facility] for cp and weakness with n/v and dec po intake EKG was stable  Cr up at 1.53 thought due to dehydration Hb 15.6 Cardiac enzymes neg   Had been on indocin and colcrys for gout   Wt is down 13 lb with bmi of 34   Then a bad headache (? If migraine is causing n/v as well)   Has implanon -thinks vomiting did get worse when she started the hormones  Has had a hysterectomy but supra cervical- too much uterus left and she has much bleeding from what is left if not on the implanon/hrt  Right now her gyn - is at [facility]   Does not always have headache when she vomits but sometimes she does  Is drinking more fluid now  Broth from soup last night  Has zofran-that takes the edge off  Also has phenergan   Takes myrbetriq - from urology and it helps/ needs a refill    Patient Active Problem List   Diagnosis Date Noted  . Renal insufficiency 03/08/2016  . Nausea with vomiting 03/08/2016  . Dehydration 03/08/2016  . Right knee pain 01/27/2016  . Adverse effects of medication 11/25/2015  . Vertigo 10/08/2015  . Pain of right thumb 03/16/2015  . Carpal tunnel syndrome 03/16/2015  . Left hip pain 02/17/2015  . Shoulder pain, left 02/17/2015  . Knee pain, bilateral 01/07/2015  . Gout 12/22/2014  . Visit for routine gyn exam 08/04/2014  . Visit for screening mammogram 08/04/2014  . Pain, joint, multiple sites 05/26/2014  . Vertiginous migraine 02/03/2014  . Thrombocytopenia ([place]) 10/01/2012  . Intractable migraine 08/31/2012  . Osteoarthritis 05/11/2012  . Female bladder prolapse 05/11/2012  . ADJ DISORDER WITH MIXED ANXIETY & DEPRESSED  MOOD 06/30/2008  . HYPERHIDROSIS 01/25/2008  . BACK PAIN, LUMBAR, CHRONIC 10/16/2007  . REACTION, ACUTE STRESS W/EMOTIONAL DSTURB 08/01/2007  . HYPERGLYCEMIA 08/01/2007  . ELEVATED BLOOD PRESSURE WITHOUT DIAGNOSIS OF HYPERTENSION 08/01/2007  . GOITER NOS 07/11/2007  . POLYCYSTIC OVARIAN DISEASE 05/24/2007  . HIRSUTISM 05/24/2007  . Arthropathy, multiple sites 05/24/2007   Past Medical History  Diagnosis Date  . Arthritis   . Depression   . Headache(784.0)   . HSV-2 infection   . Thrombocytopenia ([place]) 10/01/2012  . Immune deficiency disorder ([place])   . Stroke  [facility])    Past Surgical History  Procedure Laterality Date  . Partial hysterectomy  2012    due to fibroid  . Wisdom tooth extraction  1995  . Cesarean section  2008    x1  . Abdominal hysterectomy    . Upper gastrointestinal endoscopy     Social History  Substance Use Topics  . Smoking status: Never Smoker   . Smokeless tobacco: Never Used     Comment: Pt states she smokes.....but "not cigarettes"  . Alcohol Use: 1.2 oz/week    2 Glasses of wine per week     Comment: occ   Family History  Problem Relation Age of Onset  . Diabetes Father   . Breast cancer Paternal Aunt   .  Colon cancer Paternal Grandmother    Allergies  Allergen Reactions  . Trazodone And Nefazodone Nausea And Vomiting  . Amoxicillin Other (See Comments)    Yeast infection   . Carafate [Sucralfate] Nausea And Vomiting   Current Outpatient Prescriptions on File Prior to Visit  Medication Sig Dispense Refill  . ALPRAZolam (XANAX) 1 MG tablet Take 0.5 mg by mouth 2 (two) times daily as needed for anxiety.     . cyclobenzaprine (FLEXERIL) 10 MG tablet Take 1/2 to1 pill up tid prn for headache or muscle pain 30 tablet 3  . dicyclomine (BENTYL) 10 MG capsule Take 1 capsule (10 mg total) by mouth daily. 60 capsule 2  . etonogestrel (IMPLANON) 68 MG IMPL implant Inject 1 each into the skin continuous. Reported on 01/27/2016    . fluticasone  (FLONASE) 50 MCG/ACT nasal spray Place 2 sprays into both nostrils daily. 16 g 6  . gabapentin (NEURONTIN) 300 MG capsule Take 1 capsule (300 mg total) by mouth 3 (three) times daily. 90 capsule 3  . HYDROcodone-acetaminophen (NORCO/VICODIN) 5-325 MG per tablet Take 1 tablet by mouth every 6 (six) hours as needed for moderate pain. 20 tablet 0  . indomethacin (INDOCIN) 50 MG capsule Take 1 capsule (50 mg total) by mouth 3 (three) times daily with meals. As needed for gout pain 30 capsule 1  . MECLIZINE HCL PO Take 1 tablet by mouth at bedtime.    Marland Kitchen omeprazole (PRILOSEC) 40 MG capsule Take 1 capsule (40 mg total) by mouth daily. 14 capsule 0  . ondansetron (ZOFRAN ODT) 4 MG disintegrating tablet Take 1 tablet (4 mg total) by mouth every 8 (eight) hours as needed for nausea or vomiting. 20 tablet 1  . PARoxetine (PAXIL) 40 MG tablet Take 1 tablet (40 mg total) by mouth daily. 30 tablet 11  . promethazine (PHENERGAN) 25 MG tablet Take 1 tablet (25 mg total) by mouth every 8 (eight) hours as needed for nausea or vomiting. 30 tablet 0  . tetrahydrozoline 0.05 % ophthalmic solution Place 1 drop into both eyes daily as needed (for dry eyes).    . valACYclovir (VALTREX) 500 MG tablet Take 1 tablet (500 mg total) by mouth daily. 30 tablet 2   No current facility-administered medications on file prior to visit.     Review of Systems Review of Systems  Constitutional: Negative for fever, appetite change,  and unexpected weight change. Pos for greatly reduced po intake due to nausea Eyes: Negative for pain and visual disturbance.  Respiratory: Negative for cough and shortness of breath.   Cardiovascular: Negative for cp or palpitations   (cp is resolved) Gastrointestinal: Negative for  diarrhea and constipation. pos for intermittent n/v Genitourinary: Negative for urgency and frequency. Pos for vaginal bleeding if she comes off of hormones  Skin: Negative for pallor or rash   Neurological: Negative for  weakness, light-headedness, numbness and pos for intermittent migraine with dizziness and n/v  Hematological: Negative for adenopathy. Does not bruise/bleed easily.  Psychiatric/Behavioral: Negative for dysphoric mood. The patient is not nervous/anxious.         Objective:   Physical Exam  Constitutional: She is oriented to person, place, and time. She appears well-developed and well-nourished. No distress.  obese and well appearing  Wt loss noted however  HENT:  Head: Normocephalic and atraumatic.  Right Ear: External ear normal.  Left Ear: External ear normal.  Nose: Nose normal.  Mouth/Throat: Oropharynx is clear and moist. No oropharyngeal exudate.  No  sinus tenderness No temporal tenderness  No TMJ tenderness  Eyes: Conjunctivae and EOM are normal. Pupils are equal, round, and reactive to light. Right eye exhibits no discharge. Left eye exhibits no discharge. No scleral icterus.  No nystagmus  Neck: Normal range of motion and full passive range of motion without pain. Neck supple. No JVD present. Carotid bruit is not present. No tracheal deviation present. No thyromegaly present.  Cardiovascular: Normal rate, regular rhythm, normal heart sounds and intact distal pulses.  Exam reveals no gallop.   No murmur heard. Pulmonary/Chest: Effort normal and breath sounds normal. No respiratory distress. She has no wheezes. She has no rales.  No crackles  Abdominal: Soft. Bowel sounds are normal. She exhibits no distension, no abdominal bruit, no pulsatile midline mass and no mass. There is no hepatosplenomegaly. There is tenderness in the epigastric area. There is no rebound, no CVA tenderness, no tenderness at McBurney's point and negative Murphy's sign.  No cva tenderness  Mild suprapubic tenderness  Musculoskeletal: She exhibits no edema or tenderness.  Lymphadenopathy:    She has no cervical adenopathy.  Neurological: She is alert and oriented to person, place, and time. She has  normal strength and normal reflexes. She displays no atrophy and no tremor. No cranial nerve deficit or sensory deficit. She exhibits normal muscle tone. She displays a negative Romberg sign. Coordination and gait normal.  No focal cerebellar signs   Skin: Skin is warm and dry. No rash noted. No pallor.  Nl skin color and turgor  Brisk capillary refill time   Psychiatric: She has a normal mood and affect. Her behavior is normal. Thought content normal.          Assessment & Plan:   Problem List Items Addressed This Visit      Cardiovascular and Mediastinum   Vertiginous migraine    Ongoing - pt gets n/v with this (and not always headache- sometimes just dizziness and n/v) Has seen neuro in the past  Rev ENT note  Disc poss that her HRT was worsening it - pt plans to speak to her gyn about this (her hysterectomy left enough of uterus and cervix to cause bleeding which is why she needs the implanon)- interested in surgery to totally remove cervix and uterus so she could stop the hormonal tx         Digestive   Nausea with vomiting    Had GI w/u Suspect due to vertiginous migraine  Disc use of phenergan and zofran She will meet with gyn re: getting off hrt Lab today-has been dehydrated and inc cr  Disc gradual hydration- adv to brat diet as tolerated Consider H2 blocker to help with gastritis symptoms presumed  From empty stomach  Continue to monitor         Genitourinary   Renal insufficiency - Primary    Re check today Presumed from dehydration from n/v (? Migraine) and also prev use of indocin Will try to avoid nsaid Gradually re hydrate  Watch closely        Relevant Orders   Basic metabolic panel     Other   Dehydration    Lab today Improved with inc fluids for several days Disc gradual rehydration  [facility] Bmp today Will try to control n/v

## 2016-03-08 NOTE — Progress Notes (Signed)
Pre visit review using our clinic review tool, if applicable. No additional management support is needed unless otherwise documented below in the visit note. 

## 2016-03-31 ENCOUNTER — Emergency Department (HOSPITAL_COMMUNITY)
Admission: EM | Admit: 2016-03-31 | Discharge: 2016-03-31 | Disposition: A | Discharge status: Home / Self Care | Payer: Medicare Other | Attending: Emergency Medicine | Admitting: Emergency Medicine

## 2016-03-31 ENCOUNTER — Encounter (HOSPITAL_COMMUNITY): Payer: Self-pay | Admitting: Emergency Medicine

## 2016-03-31 ENCOUNTER — Telehealth: Payer: Self-pay | Admitting: Family Medicine

## 2016-03-31 ENCOUNTER — Ambulatory Visit (HOSPITAL_COMMUNITY)
Admission: EM | Admit: 2016-03-31 | Discharge: 2016-03-31 | Discharge status: Absent without leave | Payer: Medicare Other

## 2016-03-31 DIAGNOSIS — M1990 Unspecified osteoarthritis, unspecified site: Secondary | ICD-10-CM | POA: Diagnosis not present

## 2016-03-31 DIAGNOSIS — M7989 Other specified soft tissue disorders: Secondary | ICD-10-CM | POA: Insufficient documentation

## 2016-03-31 DIAGNOSIS — M79661 Pain in right lower leg: Secondary | ICD-10-CM | POA: Diagnosis not present

## 2016-03-31 DIAGNOSIS — M79606 Pain in leg, unspecified: Secondary | ICD-10-CM

## 2016-03-31 DIAGNOSIS — Z79899 Other long term (current) drug therapy: Secondary | ICD-10-CM | POA: Insufficient documentation

## 2016-03-31 DIAGNOSIS — Z8673 Personal history of transient ischemic attack (TIA), and cerebral infarction without residual deficits: Secondary | ICD-10-CM | POA: Diagnosis not present

## 2016-03-31 DIAGNOSIS — Z79891 Long term (current) use of opiate analgesic: Secondary | ICD-10-CM | POA: Diagnosis not present

## 2016-03-31 DIAGNOSIS — M79604 Pain in right leg: Secondary | ICD-10-CM | POA: Diagnosis present

## 2016-03-31 LAB — I-STAT CHEM 8, ED
BUN: 13 mg/dL (ref 6–20)
CALCIUM ION: 1.21 mmol/L (ref 1.12–1.23)
CHLORIDE: 105 mmol/L (ref 101–111)
Creatinine, Ser: 0.9 mg/dL (ref 0.44–1.00)
GLUCOSE: 96 mg/dL (ref 65–99)
HCT: 37 % (ref 36.0–46.0)
HEMOGLOBIN: 12.6 g/dL (ref 12.0–15.0)
Potassium: 3.9 mmol/L (ref 3.5–5.1)
SODIUM: 142 mmol/L (ref 135–145)
TCO2: 25 mmol/L (ref 0–100)

## 2016-03-31 MED ORDER — ENOXAPARIN SODIUM 100 MG/ML ~~LOC~~ SOLN
90.0000 mg | Freq: Once | SUBCUTANEOUS | Status: AC
  Administered 2016-03-31: 90 mg via SUBCUTANEOUS
  Filled 2016-03-31: qty 1

## 2016-03-31 NOTE — ED Provider Notes (Signed)
CSN: [id number]     Arrival date & time 03/31/16  1556 History   First MD Initiated Contact with Patient 03/31/16 2001     Chief Complaint  Patient presents with  . Leg Pain     (Consider location/radiation/quality/duration/timing/severity/associated sxs/prior Treatment) Patient is a 45 y.o. female presenting with general illness. The history is provided by the patient.  Illness Location:  Right lower extremity pain Severity:  Mild Onset quality:  Gradual Duration:  1 week Timing:  Constant Progression:  Worsening Chronicity:  New Context:  1 week of right lower extremity pain. Endorses mild swelling that is intermittent in nature. No fevers or chills. No infectious symptoms. Mostly located in her calf and extends up into her thigh. No rash. No trauma.  Associated symptoms: no abdominal pain, no chest pain, no cough, no diarrhea, no fever, no headaches, no nausea, no rash, no shortness of breath and no vomiting     Past Medical History  Diagnosis Date  . Arthritis   . Depression   . Headache(784.0)   . HSV-2 infection   . Thrombocytopenia ([place]) 10/01/2012  . Immune deficiency disorder ([place])   . Stroke [facility])    Past Surgical History  Procedure Laterality Date  . Partial hysterectomy  2012    due to fibroid  . Wisdom tooth extraction  1995  . Cesarean section  2008    x1  . Abdominal hysterectomy    . Upper gastrointestinal endoscopy     Family History  Problem Relation Age of Onset  . Diabetes Father   . Breast cancer Paternal Aunt   . Colon cancer Paternal Grandmother    Social History  Substance Use Topics  . Smoking status: Never Smoker   . Smokeless tobacco: Never Used     Comment: Pt states she smokes.....but "not cigarettes"  . Alcohol Use: 1.2 oz/week    2 Glasses of wine per week     Comment: occ   OB History    Gravida Para Term Preterm AB TAB SAB Ectopic Multiple Living   3         3      Obstetric Comments   Pt states that she has had a lot of  miscarriages. Do not know how many     Review of Systems  Constitutional: Negative for fever and chills.  Respiratory: Negative for cough and shortness of breath.   Cardiovascular: Negative for chest pain.  Gastrointestinal: Negative for nausea, vomiting, abdominal pain and diarrhea.  Musculoskeletal: Negative for back pain.  Skin: Negative for rash and wound.  Neurological: Negative for light-headedness and headaches.  All other systems reviewed and are negative.     Allergies  Trazodone and nefazodone; Amoxicillin; and Carafate  Home Medications   Prior to Admission medications   Medication Sig Start Date End Date Taking? Authorizing Provider  ALPRAZolam Duanne Moron) 1 MG tablet Take 0.5 mg by mouth 2 (two) times daily as needed for anxiety.     Historical Provider, MD  cyclobenzaprine (FLEXERIL) 10 MG tablet Take 1/2 to1 pill up tid prn for headache or muscle pain 02/17/15   Abner Greenspan, MD  dicyclomine (BENTYL) 10 MG capsule Take 1 capsule (10 mg total) by mouth daily. 10/05/15   Gatha Mayer, MD  etonogestrel (IMPLANON) 68 MG IMPL implant Inject 1 each into the skin continuous. Reported on 01/27/2016    Historical Provider, MD  fluticasone (FLONASE) 50 MCG/ACT nasal spray Place 2 sprays into both nostrils daily. 04/07/14  Abner Greenspan, MD  gabapentin (NEURONTIN) 300 MG capsule Take 1 capsule (300 mg total) by mouth 3 (three) times daily. 11/25/15   Abner Greenspan, MD  HYDROcodone-acetaminophen (NORCO/VICODIN) 5-325 MG per tablet Take 1 tablet by mouth every 6 (six) hours as needed for moderate pain. 04/03/15   Jearld Fenton, NP  indomethacin (INDOCIN) 50 MG capsule Take 1 capsule (50 mg total) by mouth 3 (three) times daily with meals. As needed for gout pain 01/13/16   Abner Greenspan, MD  MECLIZINE HCL PO Take 1 tablet by mouth at bedtime.    Historical Provider, MD  mirabegron ER (MYRBETRIQ) 50 MG TB24 tablet Take 1 tablet (50 mg total) by mouth daily. 03/08/16   Abner Greenspan, MD   omeprazole (PRILOSEC) 40 MG capsule Take 1 capsule (40 mg total) by mouth daily. 11/29/14   Quintella Reichert, MD  ondansetron (ZOFRAN ODT) 4 MG disintegrating tablet Take 1 tablet (4 mg total) by mouth every 8 (eight) hours as needed for nausea or vomiting. 01/13/16   Abner Greenspan, MD  PARoxetine (PAXIL) 40 MG tablet Take 1 tablet (40 mg total) by mouth daily. 05/26/14   Abner Greenspan, MD  promethazine (PHENERGAN) 25 MG tablet Take 1 tablet (25 mg total) by mouth every 8 (eight) hours as needed for nausea or vomiting. 01/14/16   Midge Minium, MD  tetrahydrozoline 0.05 % ophthalmic solution Place 1 drop into both eyes daily as needed (for dry eyes).    Historical Provider, MD  valACYclovir (VALTREX) 500 MG tablet Take 1 tablet (500 mg total) by mouth daily. 11/04/15   Marne A [name], MD   BP 142/100 mmHg  Pulse 69  Temp(Src) 98.9 F (37.2 C) (Oral)  Resp 18  SpO2 100% Physical Exam  Constitutional: She is oriented to person, place, and time. She appears well-developed and well-nourished. No distress.  HENT:  Head: Normocephalic and atraumatic.  Eyes: EOM are normal. Pupils are equal, round, and reactive to light.  Neck: Normal range of motion.  Cardiovascular: Normal rate and regular rhythm.   Pulmonary/Chest: No tachypnea. No respiratory distress.  Abdominal: Soft. Normal appearance. There is no tenderness.  Musculoskeletal:       Right upper leg: Normal.       Left upper leg: Normal.       Right lower leg: She exhibits tenderness. She exhibits no swelling, no edema, no deformity and no laceration.       Left lower leg: She exhibits no swelling and no edema.       Right foot: Normal.       Left foot: Normal.  Neurological: She is alert and oriented to person, place, and time. GCS eye subscore is 4. GCS verbal subscore is 5. GCS motor subscore is 6.  Skin: Skin is warm and dry.    ED Course  Procedures (including critical care time) Labs Review Labs Reviewed - No data to  display  Imaging Review No results found. I have personally reviewed and evaluated these images and lab results as part of my medical decision-making.   EKG Interpretation None      MDM   Final diagnoses:  Leg pain    45 year old female with a past medical history fibromyalgia presenting with right lower extremity pain. Present for the last 1 week. Getting progressively worse. Endorses swelling. On exam here the patient is well-appearing in no acute distress. Vital signs are normal and stable. No tachycardia, no tachypnea, no hypoxia.  Denies any chest pain or shortness of breath. Doubt pulmonary embolism. I do not notice any swelling on my physical exam. She does have some tenderness to palpation of her posterior right lower leg. Neurovascularly intact with 2+ DP and PT pulses bilaterally. Full range of motion of all joints; however she does say that she has some pain of her calf with dorsiflexion of her right foot. Does not have any rashes. Denies any recent trauma. No gross deformities. No joint effusions identified.  Unable to have duplex done here today. Getting an ultrasound of the patient's right lower extremity to evaluate for a DVT as an outpatient. We'll give her a shot of Lovenox here. Checking a chem 8 i-STAT - no electrolyte abnormalities or anemia.  If ultrasound is normal, feel the most likely cause of the patient's pain is related to her fibromyalgia as she reports that she has resolution of symptoms after taking gabapentin.  Strict return cautions provided. Patient discharged in stable condition.  Maryan Puls, MD 03/31/16 [id number]  Merrily Pew, MD 03/31/16 2055

## 2016-03-31 NOTE — ED Provider Notes (Signed)
I saw and evaluated the patient, reviewed the resident's note and I agree with the findings and plan.  45 year old female with right leg pain and swelling progressively worsening over the last few days. No history of same. States it feels tight in the calf and worse when she walks. Also worse with dorsiflexion of her foot. No history of blood clots however that is what needs to be ruled out. Cannot get a ultrasound right now so we will give a dose of Lovenox and she'll come back tomorrow for an ultrasound. She knows that she is to check back in if her ultrasound is positive to get started on anticoagulation. Less likely that she has a fracture. Could also be related to fibromyalgia as neurontin seems to help some.   Merrily Pew, MD 03/31/16 2056

## 2016-03-31 NOTE — Telephone Encounter (Signed)
Patient Name: Dana Greer  DOB: 29-Mar-1971    Initial Comment Caller states having excrutiating leg pain; not sure if related to psuedo gout in knee; leg is swollen and tight; persistent pain.    Nurse Assessment  Nurse: Raphael Gibney, RN, Vera Date/Time (Eastern Time): 03/31/2016 2:25:16 PM  Confirm and document reason for call. If symptomatic, describe symptoms. You must click the next button to save text entered. ---Caller states she has a lot of pain in her right leg. Leg is swollen in her right calf. Has swelling on her right foot and ankle. Had polka dot bruises on her leg 2 weeks ago which went away. She had pseudo gout in the knee.  Has the patient traveled out of the country within the last 30 days? ---Not Applicable  Does the patient have any new or worsening symptoms? ---Yes  Will a triage be completed? ---Yes  Related visit to physician within the last 2 weeks? ---No  Does the PT have any chronic conditions? (i.e. diabetes, asthma, etc.) ---No  Is the patient pregnant or possibly pregnant? (Ask all females between the ages of 101-55) ---No  Is this a behavioral health or substance abuse call? ---No     Guidelines    Guideline Title Affirmed Question Affirmed Notes  Leg Pain [1] Thigh or calf pain AND [2] only 1 side AND [3] present > 1 hour    Final Disposition User   See Physician within 4 Hours (or PCP triage) Raphael Gibney, RN, Vera    Comments  No appts available at [facility] or at [facility]. Pt states she will go to [facility] - ED   Disagree/Comply: Comply

## 2016-03-31 NOTE — Telephone Encounter (Signed)
Per chart review tab appears pt is at [facility].

## 2016-03-31 NOTE — ED Notes (Signed)
patient here with nearly 2 weeks of right leg pain. Reports pain radiates from calf up to hip. Complains of swelling of entire leg. Denies trauma

## 2016-03-31 NOTE — ED Notes (Signed)
Pt verbalized understanding to come back to the admitting office in the morning for vascular study. Pt has no further questions. Pt stable and NAD at this time.

## 2016-04-01 ENCOUNTER — Ambulatory Visit (HOSPITAL_COMMUNITY)
Admission: RE | Admit: 2016-04-01 | Discharge: 2016-04-01 | Disposition: A | Discharge status: Home / Self Care | Payer: Medicare Other | Source: Ambulatory Visit | Attending: Emergency Medicine | Admitting: Emergency Medicine

## 2016-04-01 DIAGNOSIS — M79661 Pain in right lower leg: Secondary | ICD-10-CM | POA: Insufficient documentation

## 2016-04-01 DIAGNOSIS — M7989 Other specified soft tissue disorders: Secondary | ICD-10-CM | POA: Insufficient documentation

## 2016-04-01 DIAGNOSIS — R938 Abnormal findings on diagnostic imaging of other specified body structures: Secondary | ICD-10-CM | POA: Diagnosis not present

## 2016-04-01 DIAGNOSIS — F329 Major depressive disorder, single episode, unspecified: Secondary | ICD-10-CM | POA: Insufficient documentation

## 2016-04-01 DIAGNOSIS — D849 Immunodeficiency, unspecified: Secondary | ICD-10-CM | POA: Insufficient documentation

## 2016-04-01 DIAGNOSIS — M79609 Pain in unspecified limb: Secondary | ICD-10-CM

## 2016-04-01 NOTE — Progress Notes (Signed)
*  PRELIMINARY RESULTS* Vascular Ultrasound Right lower extremity venous duplex has been completed.  Preliminary findings: No evidence of DVT. A cystic area is noted in the right popliteal fossa anteriorly adjacent to vessels, and this location does not appear to be baker's cyst.    Landry Mellow, RDMS, RVT  04/01/2016, 9:12 AM

## 2016-04-06 ENCOUNTER — Other Ambulatory Visit: Payer: Self-pay | Admitting: *Deleted

## 2016-04-06 MED ORDER — PROMETHAZINE HCL 25 MG PO TABS: 25.0000 mg | ORAL_TABLET | Freq: Three times a day (TID) | ORAL | Status: DC | PRN

## 2016-04-06 NOTE — Telephone Encounter (Signed)
Fax refill request, last filled by Dr. Birdie Riddle on 01/14/16 #30 with 0 refills

## 2016-04-06 NOTE — Telephone Encounter (Signed)
Please refill times one  

## 2016-05-17 ENCOUNTER — Other Ambulatory Visit: Payer: Self-pay

## 2016-05-17 NOTE — Telephone Encounter (Signed)
Urologist gave pt abx 5 days ago and now pt has cheesy vaginal discharge; no itching. Pt request diflucan to [place]. Please advise.pt request cb when refilled.

## 2016-05-18 MED ORDER — FLUCONAZOLE 150 MG PO TABS: 150.0000 mg | ORAL_TABLET | Freq: Once | ORAL | Status: DC

## 2016-05-18 NOTE — Telephone Encounter (Signed)
Done

## 2016-05-18 NOTE — Telephone Encounter (Signed)
Pt notified Rx sent to pharmacy and to f/u if no improvement  

## 2016-06-13 ENCOUNTER — Other Ambulatory Visit: Payer: Self-pay | Admitting: Internal Medicine

## 2016-06-13 ENCOUNTER — Ambulatory Visit (INDEPENDENT_AMBULATORY_CARE_PROVIDER_SITE_OTHER): Payer: Medicare Other | Admitting: Internal Medicine

## 2016-06-13 ENCOUNTER — Encounter: Payer: Self-pay | Admitting: Internal Medicine

## 2016-06-13 ENCOUNTER — Telehealth: Payer: Self-pay | Admitting: Family Medicine

## 2016-06-13 VITALS — BP 126/88 | HR 76 | Temp 99.2°F | Wt 207.0 lb

## 2016-06-13 DIAGNOSIS — R062 Wheezing: Secondary | ICD-10-CM

## 2016-06-13 DIAGNOSIS — J069 Acute upper respiratory infection, unspecified: Secondary | ICD-10-CM

## 2016-06-13 DIAGNOSIS — B9789 Other viral agents as the cause of diseases classified elsewhere: Principal | ICD-10-CM

## 2016-06-13 MED ORDER — HYDROCOD POLST-CPM POLST ER 10-8 MG/5ML PO SUER: 5.0000 mL | Freq: Two times a day (BID) | ORAL | 0 refills | Status: DC | PRN

## 2016-06-13 MED ORDER — HYDROCODONE-HOMATROPINE 5-1.5 MG/5ML PO SYRP: 5.0000 mL | ORAL_SOLUTION | Freq: Three times a day (TID) | ORAL | 0 refills | Status: DC | PRN

## 2016-06-13 MED ORDER — ALBUTEROL SULFATE HFA 108 (90 BASE) MCG/ACT IN AERS: 2.0000 | INHALATION_SPRAY | Freq: Four times a day (QID) | RESPIRATORY_TRACT | 0 refills | Status: DC | PRN

## 2016-06-13 NOTE — Patient Instructions (Signed)
Upper Respiratory Infection, Adult Most upper respiratory infections (URIs) are a viral infection of the air passages leading to the lungs. A URI affects the nose, throat, and upper air passages. The most common type of URI is nasopharyngitis and is typically referred to as "the common cold." URIs run their course and usually go away on their own. Most of the time, a URI does not require medical attention, but sometimes a bacterial infection in the upper airways can follow a viral infection. This is called a secondary infection. Sinus and middle ear infections are common types of secondary upper respiratory infections. Bacterial pneumonia can also complicate a URI. A URI can worsen asthma and chronic obstructive pulmonary disease (COPD). Sometimes, these complications can require emergency medical care and may be life threatening.  CAUSES Almost all URIs are caused by viruses. A virus is a type of germ and can spread from one person to another.  RISKS FACTORS You may be at risk for a URI if:   You smoke.   You have chronic heart or lung disease.  You have a weakened defense (immune) system.   You are very young or very old.   You have nasal allergies or asthma.  You work in crowded or poorly ventilated areas.  You work in health care facilities or schools. SIGNS AND SYMPTOMS  Symptoms typically develop 2-3 days after you come in contact with a cold virus. Most viral URIs last 7-10 days. However, viral URIs from the influenza virus (flu virus) can last 14-18 days and are typically more severe. Symptoms may include:   Runny or stuffy (congested) nose.   Sneezing.   Cough.   Sore throat.   Headache.   Fatigue.   Fever.   Loss of appetite.   Pain in your forehead, behind your eyes, and over your cheekbones (sinus pain).  Muscle aches.  DIAGNOSIS  Your health care provider may diagnose a URI by:  Physical exam.  Tests to check that your symptoms are not due to  another condition such as:  Strep throat.  Sinusitis.  Pneumonia.  Asthma. TREATMENT  A URI goes away on its own with time. It cannot be cured with medicines, but medicines may be prescribed or recommended to relieve symptoms. Medicines may help:  Reduce your fever.  Reduce your cough.  Relieve nasal congestion. HOME CARE INSTRUCTIONS   Take medicines only as directed by your health care provider.   Gargle warm saltwater or take cough drops to comfort your throat as directed by your health care provider.  Use a warm mist humidifier or inhale steam from a shower to increase air moisture. This may make it easier to breathe.  Drink enough fluid to keep your urine clear or pale yellow.   Eat soups and other clear broths and maintain good nutrition.   Rest as needed.   Return to work when your temperature has returned to normal or as your health care provider advises. You may need to stay home longer to avoid infecting others. You can also use a face mask and careful hand washing to prevent spread of the virus.  Increase the usage of your inhaler if you have asthma.   Do not use any tobacco products, including cigarettes, chewing tobacco, or electronic cigarettes. If you need help quitting, ask your health care provider. PREVENTION  The best way to protect yourself from getting a cold is to practice good hygiene.   Avoid oral or hand contact with people with cold   symptoms.   Wash your hands often if contact occurs.  There is no clear evidence that vitamin C, vitamin E, echinacea, or exercise reduces the chance of developing a cold. However, it is always recommended to get plenty of rest, exercise, and practice good nutrition.  SEEK MEDICAL CARE IF:   You are getting worse rather than better.   Your symptoms are not controlled by medicine.   You have chills.  You have worsening shortness of breath.  You have brown or red mucus.  You have yellow or brown nasal  discharge.  You have pain in your face, especially when you bend forward.  You have a fever.  You have swollen neck glands.  You have pain while swallowing.  You have white areas in the back of your throat. SEEK IMMEDIATE MEDICAL CARE IF:   You have severe or persistent:  Headache.  Ear pain.  Sinus pain.  Chest pain.  You have chronic lung disease and any of the following:  Wheezing.  Prolonged cough.  Coughing up blood.  A change in your usual mucus.  You have a stiff neck.  You have changes in your:  Vision.  Hearing.  Thinking.  Mood. MAKE SURE YOU:   Understand these instructions.  Will watch your condition.  Will get help right away if you are not doing well or get worse.   This information is not intended to replace advice given to you by your health care provider. Make sure you discuss any questions you have with your health care provider.   Document Released: 04/12/2001 Document Revised: 03/03/2015 Document Reviewed: 01/22/2014 Elsevier Interactive Patient Education 2016 [employer] Inc[employer]  

## 2016-06-13 NOTE — Telephone Encounter (Signed)
Pt left VM on triage phone.  Was seen today by R. Baity and her insurance will not cover prescribed   HYDROcodone-homatropine (HYCODAN) 5-1.5 MG/5ML syrup  , pt would like to know if there is another option.  [place].  Pt request CB at [phone]

## 2016-06-13 NOTE — Telephone Encounter (Signed)
We can try Tussionex, RX printed and signed and placed MYD box. Can she bring the RX for Hycodan back?

## 2016-06-13 NOTE — Progress Notes (Signed)
Subjective:    Patient ID: Dana Greer, female    DOB: 1970-11-03, 45 y.o.   MRN: [id number]  HPI  Pt presents to the clinic today with a complaint of cough x 1 week.  She reports the cough has been productive of clear sputum, and denies hemoptysis.  She also complains of wheezing, and states the cough and wheezing are worse with lying down and at night time.  She admits to chest congestion, sore throat, stuffy nose, watery eyes, and red eyes when symptoms began, but states these symptoms have improved.  She reports continued runny nose with clear mucus, drainage down the back of the throat, and scratchy/itchy throat.  She denies fevers, chills, headache, sinus pressure, ear pain, or ear fullness.  She has tried OTC Advil cold with some relief.  She also reports trying her son's Albuterol inhaler with no relief.  She reports a history of seasonal allergies, and denies a history of asthma.  She states her children developed similar symptoms after hers began, and reports they have a history of allergies as well.      Review of Systems  Past Medical History:  Diagnosis Date  . Arthritis   . Depression   . Headache(784.0)   . HSV-2 infection   . Immune deficiency disorder ([place])   . Stroke ([place])   . Thrombocytopenia ([place]) 10/01/2012   Past Surgical History:  Procedure Laterality Date  . ABDOMINAL HYSTERECTOMY    . CESAREAN SECTION  2008   x1  . PARTIAL HYSTERECTOMY  2012   due to fibroid  . UPPER GASTROINTESTINAL ENDOSCOPY    . WISDOM TOOTH EXTRACTION  1995   Family History  Problem Relation Age of Onset  . Diabetes Father   . Breast cancer Paternal Aunt   . Colon cancer Paternal Grandmother    Current Outpatient Prescriptions on File Prior to Visit  Medication Sig Dispense Refill  . ALPRAZolam (XANAX) 1 MG tablet Take 0.5 mg by mouth 2 (two) times daily as needed for anxiety.     . cyclobenzaprine (FLEXERIL) 10 MG tablet Take 1/2 to1 pill up tid prn for headache or  muscle pain (Patient taking differently: Take 5-10 mg by mouth 3 (three) times daily as needed (headaches or muscle pain). ) 30 tablet 3  . dicyclomine (BENTYL) 10 MG capsule Take 1 capsule (10 mg total) by mouth daily. (Patient taking differently: Take 10 mg by mouth daily as needed. ) 60 capsule 2  . etonogestrel (IMPLANON) 68 MG IMPL implant Inject 1 each into the skin continuous. Reported on 01/27/2016    . fluconazole (DIFLUCAN) 150 MG tablet Take 1 tablet (150 mg total) by mouth once. 1 tablet 0  . fluticasone (FLONASE) 50 MCG/ACT nasal spray Place 2 sprays into both nostrils daily. (Patient taking differently: Place 2 sprays into both nostrils daily as needed. ) 16 g 6  . gabapentin (NEURONTIN) 300 MG capsule Take 1 capsule (300 mg total) by mouth 3 (three) times daily. 90 capsule 3  . HYDROcodone-acetaminophen (NORCO/VICODIN) 5-325 MG per tablet Take 1 tablet by mouth every 6 (six) hours as needed for moderate pain. 20 tablet 0  . indomethacin (INDOCIN) 50 MG capsule Take 1 capsule (50 mg total) by mouth 3 (three) times daily with meals. As needed for gout pain 30 capsule 1  . MECLIZINE HCL PO Take 1 tablet by mouth at bedtime as needed (for vertigo).     . mirabegron ER (MYRBETRIQ) 50 MG TB24 tablet  Take 1 tablet (50 mg total) by mouth daily. 30 tablet 5  . omeprazole (PRILOSEC) 40 MG capsule Take 1 capsule (40 mg total) by mouth daily. 14 capsule 0  . ondansetron (ZOFRAN ODT) 4 MG disintegrating tablet Take 1 tablet (4 mg total) by mouth every 8 (eight) hours as needed for nausea or vomiting. 20 tablet 1  . PARoxetine (PAXIL) 40 MG tablet Take 1 tablet (40 mg total) by mouth daily. 30 tablet 11  . promethazine (PHENERGAN) 25 MG tablet Take 1 tablet (25 mg total) by mouth every 8 (eight) hours as needed for nausea or vomiting. 30 tablet 0  . ranitidine (ZANTAC) 75 MG tablet Take 75 mg by mouth every morning.    Marland Kitchen tetrahydrozoline 0.05 % ophthalmic solution Place 1 drop into both eyes daily as  needed (for dry eyes).    . triamterene-hydrochlorothiazide (DYAZIDE) 37.5-25 MG capsule Take 1 capsule by mouth daily.    . valACYclovir (VALTREX) 500 MG tablet Take 1 tablet (500 mg total) by mouth daily. 30 tablet 2   No current facility-administered medications on file prior to visit.    Allergies  Allergen Reactions  . Trazodone And Nefazodone Nausea And Vomiting  . Amoxicillin Other (See Comments)    Yeast infection   . Carafate [Sucralfate] Nausea And Vomiting    Const: Denies fevers, chills, or headaches. HEENT: Pt reports sore/scratchy/itchy throat, drainage down the back of the throat, runny nose, stuffy nose, and watery and red eyes.  Denies ear pain or ear fullness. Pulm: Pt reports cough, wheezing, shortness of breath, and clear sputum production.  Denies hemoptysis. CV: Denies chest pain.     Objective:   Physical Exam  BP 126/88   Pulse 76   Temp 99.2 F (37.3 C) (Oral)   Wt 207 lb (93.9 kg)   SpO2 96%   BMI 37.26 kg/m   General: Well-appearing, in no acute distress. HEENT: No scleral icterus or conjunctival injection.  TMs pearly grey, landmarks visible.  Mild erythema of nasal turbinates.  No erythema or exudates of pharynx.  Sinuses nontender to palpation. Neck: No lymphadenopathy noted. Pulm: Mild inspiratory wheezes present bilaterally.  No rales or rhonchi. CV: Regular rate and rhythm.  No murmurs, rubs, or gallops.      Assessment & Plan:   Viral URI with cough:  Rx for Hycodan 65ml q8hrs as needed for cough Try Mucinex 600 mg every 12 hours  Wheezing:  eRx for Albuterol inhaler Pt reports decrease in kidney function after using oral steroids in the past  Call if symptoms worsen or do not resolve BAITY, REGINA, NP

## 2016-06-13 NOTE — Progress Notes (Signed)
Patient ID: Dana Greer, female    DOB: May 22, 1971, 45 y.o.   MRN: [id number]  HPI  Pt presents to the clinic today with a complaint of cough x 1 week.  She reports the cough has been productive of clear sputum, and denies hemoptysis.  She also complains of wheezing, and states the cough and wheezing are worse with lying down and at night time.  She admits to chest congestion, sore throat, stuffy nose, watery eyes, and red eyes when symptoms began, but states these symptoms have improved.  She reports continued runny nose with clear mucus, drainage down the back of the throat, and scratchy/itchy throat.  She denies fevers, chills, headache, sinus pressure, ear pain, or ear fullness.  She has tried OTC Advil cold with some relief.  She also reports trying her son's Albuterol inhaler with no relief.  She reports a history of seasonal allergies, and denies a history of asthma.  She states her children developed similar symptoms after hers began, and reports they have a history of allergies as well.      Review of Systems  Past Medical History:  Diagnosis Date  . Arthritis   . Depression   . Headache(784.0)   . HSV-2 infection   . Immune deficiency disorder ([place])   . Stroke ([place])   . Thrombocytopenia ([place]) 10/01/2012   Past Surgical History:  Procedure Laterality Date  . ABDOMINAL HYSTERECTOMY    . CESAREAN SECTION  2008   x1  . PARTIAL HYSTERECTOMY  2012   due to fibroid  . UPPER GASTROINTESTINAL ENDOSCOPY    . WISDOM TOOTH EXTRACTION  1995   Family History  Problem Relation Age of Onset  . Diabetes Father   . Breast cancer Paternal Aunt   . Colon cancer Paternal Grandmother    Current Outpatient Prescriptions on File Prior to Visit  Medication Sig Dispense Refill  . ALPRAZolam (XANAX) 1 MG tablet Take 0.5 mg by mouth 2 (two) times daily as needed for anxiety.     . cyclobenzaprine (FLEXERIL) 10 MG tablet Take 1/2 to1 pill up tid prn for headache or muscle pain (Patient  taking differently: Take 5-10 mg by mouth 3 (three) times daily as needed (headaches or muscle pain). ) 30 tablet 3  . dicyclomine (BENTYL) 10 MG capsule Take 1 capsule (10 mg total) by mouth daily. (Patient taking differently: Take 10 mg by mouth daily as needed. ) 60 capsule 2  . etonogestrel (IMPLANON) 68 MG IMPL implant Inject 1 each into the skin continuous. Reported on 01/27/2016    . fluconazole (DIFLUCAN) 150 MG tablet Take 1 tablet (150 mg total) by mouth once. 1 tablet 0  . fluticasone (FLONASE) 50 MCG/ACT nasal spray Place 2 sprays into both nostrils daily. (Patient taking differently: Place 2 sprays into both nostrils daily as needed. ) 16 g 6  . gabapentin (NEURONTIN) 300 MG capsule Take 1 capsule (300 mg total) by mouth 3 (three) times daily. 90 capsule 3  . HYDROcodone-acetaminophen (NORCO/VICODIN) 5-325 MG per tablet Take 1 tablet by mouth every 6 (six) hours as needed for moderate pain. 20 tablet 0  . indomethacin (INDOCIN) 50 MG capsule Take 1 capsule (50 mg total) by mouth 3 (three) times daily with meals. As needed for gout pain 30 capsule 1  . MECLIZINE HCL PO Take 1 tablet by mouth at bedtime as needed (for vertigo).     . mirabegron ER (MYRBETRIQ) 50 MG TB24 tablet Take 1 tablet (50 mg  total) by mouth daily. 30 tablet 5  . omeprazole (PRILOSEC) 40 MG capsule Take 1 capsule (40 mg total) by mouth daily. 14 capsule 0  . ondansetron (ZOFRAN ODT) 4 MG disintegrating tablet Take 1 tablet (4 mg total) by mouth every 8 (eight) hours as needed for nausea or vomiting. 20 tablet 1  . PARoxetine (PAXIL) 40 MG tablet Take 1 tablet (40 mg total) by mouth daily. 30 tablet 11  . promethazine (PHENERGAN) 25 MG tablet Take 1 tablet (25 mg total) by mouth every 8 (eight) hours as needed for nausea or vomiting. 30 tablet 0  . ranitidine (ZANTAC) 75 MG tablet Take 75 mg by mouth every morning.    Marland Kitchen tetrahydrozoline 0.05 % ophthalmic solution Place 1 drop into both eyes daily as needed (for dry  eyes).    . triamterene-hydrochlorothiazide (DYAZIDE) 37.5-25 MG capsule Take 1 capsule by mouth daily.    . valACYclovir (VALTREX) 500 MG tablet Take 1 tablet (500 mg total) by mouth daily. 30 tablet 2   No current facility-administered medications on file prior to visit.    Allergies  Allergen Reactions  . Trazodone And Nefazodone Nausea And Vomiting  . Amoxicillin Other (See Comments)    Yeast infection   . Carafate [Sucralfate] Nausea And Vomiting    Const: Denies fevers, chills, or headaches. HEENT: Pt reports sore/scratchy/itchy throat, drainage down the back of the throat, runny nose, stuffy nose, and watery and red eyes.  Denies ear pain or ear fullness. Pulm: Pt reports cough, wheezing, shortness of breath, and clear sputum production.  Denies hemoptysis. CV: Denies chest pain.     Objective:   Physical Exam  BP 126/88   Pulse 76   Temp 99.2 F (37.3 C) (Oral)   Wt 207 lb (93.9 kg)   SpO2 96%   BMI 37.26 kg/m   General: Well-appearing, in no acute distress. HEENT: No scleral icterus or conjunctival injection.  TMs pearly grey, landmarks visible.  Mild erythema of nasal turbinates.  No erythema or exudates of pharynx.  Sinuses nontender to palpation. Neck: No lymphadenopathy noted. Pulm: Mild inspiratory wheezes present bilaterally.  No rales or rhonchi. CV: Regular rate and rhythm.  No murmurs, rubs, or gallops.      Assessment & Plan:   Viral URI with cough:  Rx for Hycodan 89ml q8hrs as needed for cough Try Mucinex 600 mg every 12 hours  Wheezing:  eRx for Albuterol inhaler Pt reports decrease in kidney function after using oral steroids in the past  Call if symptoms worsen or do not resolve Arvind Mexicano, NP

## 2016-06-13 NOTE — Telephone Encounter (Signed)
Pt is aware---Rx left in front office for pick up and pt is aware

## 2016-07-05 ENCOUNTER — Emergency Department (HOSPITAL_COMMUNITY)
Admission: EM | Admit: 2016-07-05 | Discharge: 2016-07-05 | Disposition: A | Discharge status: Home / Self Care | Payer: Medicare Other | Attending: Emergency Medicine | Admitting: Emergency Medicine

## 2016-07-05 ENCOUNTER — Encounter (HOSPITAL_COMMUNITY): Payer: Self-pay

## 2016-07-05 ENCOUNTER — Other Ambulatory Visit: Payer: Self-pay | Admitting: Family Medicine

## 2016-07-05 ENCOUNTER — Telehealth: Payer: Self-pay

## 2016-07-05 DIAGNOSIS — Z8673 Personal history of transient ischemic attack (TIA), and cerebral infarction without residual deficits: Secondary | ICD-10-CM | POA: Insufficient documentation

## 2016-07-05 DIAGNOSIS — Z79899 Other long term (current) drug therapy: Secondary | ICD-10-CM | POA: Insufficient documentation

## 2016-07-05 DIAGNOSIS — R197 Diarrhea, unspecified: Secondary | ICD-10-CM | POA: Diagnosis not present

## 2016-07-05 DIAGNOSIS — R112 Nausea with vomiting, unspecified: Secondary | ICD-10-CM | POA: Diagnosis not present

## 2016-07-05 LAB — COMPREHENSIVE METABOLIC PANEL
ALBUMIN: 4.3 g/dL (ref 3.5–5.0)
ALK PHOS: 46 U/L (ref 38–126)
ALT: 14 U/L (ref 14–54)
ANION GAP: 12 (ref 5–15)
AST: 21 U/L (ref 15–41)
BILIRUBIN TOTAL: 1 mg/dL (ref 0.3–1.2)
BUN: 7 mg/dL (ref 6–20)
CALCIUM: 9.7 mg/dL (ref 8.9–10.3)
CO2: 21 mmol/L — ABNORMAL LOW (ref 22–32)
Chloride: 104 mmol/L (ref 101–111)
Creatinine, Ser: 1.06 mg/dL — ABNORMAL HIGH (ref 0.44–1.00)
GLUCOSE: 170 mg/dL — AB (ref 65–99)
POTASSIUM: 3.3 mmol/L — AB (ref 3.5–5.1)
Sodium: 137 mmol/L (ref 135–145)
TOTAL PROTEIN: 7.3 g/dL (ref 6.5–8.1)

## 2016-07-05 LAB — URINALYSIS, ROUTINE W REFLEX MICROSCOPIC
BILIRUBIN URINE: NEGATIVE
GLUCOSE, UA: 100 mg/dL — AB
LEUKOCYTES UA: NEGATIVE
NITRITE: NEGATIVE
PROTEIN: 30 mg/dL — AB
SPECIFIC GRAVITY, URINE: 1.017 (ref 1.005–1.030)
pH: 7.5 (ref 5.0–8.0)

## 2016-07-05 LAB — CBC
HEMATOCRIT: 42.6 % (ref 36.0–46.0)
HEMOGLOBIN: 14.5 g/dL (ref 12.0–15.0)
MCH: 32.4 pg (ref 26.0–34.0)
MCHC: 34 g/dL (ref 30.0–36.0)
MCV: 95.1 fL (ref 78.0–100.0)
Platelets: 169 10*3/uL (ref 150–400)
RBC: 4.48 MIL/uL (ref 3.87–5.11)
RDW: 13.2 % (ref 11.5–15.5)
WBC: 8.9 10*3/uL (ref 4.0–10.5)

## 2016-07-05 LAB — URINE MICROSCOPIC-ADD ON

## 2016-07-05 LAB — LIPASE, BLOOD: Lipase: 19 U/L (ref 11–51)

## 2016-07-05 MED ORDER — SODIUM CHLORIDE 0.9 % IV BOLUS (SEPSIS)
1000.0000 mL | Freq: Once | INTRAVENOUS | Status: AC
  Administered 2016-07-05: 1000 mL via INTRAVENOUS

## 2016-07-05 MED ORDER — ONDANSETRON 4 MG PO TBDP: 4.0000 mg | ORAL_TABLET | Freq: Three times a day (TID) | ORAL | 0 refills | Status: DC | PRN

## 2016-07-05 MED ORDER — PROMETHAZINE HCL 25 MG/ML IJ SOLN
12.5000 mg | Freq: Once | INTRAMUSCULAR | Status: AC
  Administered 2016-07-05: 12.5 mg via INTRAVENOUS
  Filled 2016-07-05: qty 1

## 2016-07-05 MED ORDER — ONDANSETRON HCL 4 MG/2ML IJ SOLN
4.0000 mg | Freq: Once | INTRAMUSCULAR | Status: AC
  Administered 2016-07-05: 4 mg via INTRAVENOUS
  Filled 2016-07-05: qty 2

## 2016-07-05 NOTE — Telephone Encounter (Signed)
PLEASE NOTE: All timestamps contained within this report are represented as [place] Standard Time. CONFIDENTIALTY NOTICE: This fax transmission is intended only for the addressee. It contains information that is legally privileged, confidential or otherwise protected from use or disclosure. If you are not the intended recipient, you are strictly prohibited from reviewing, disclosing, copying using or disseminating any of this information or taking any action in reliance on or regarding this information. If you have received this fax in error, please notify us immediately by telephone so that we can arrange for its return to [place]. Phone: [phone], Toll-Free: [phone], Fax: [phone] Page: 1 of 2 Call Id: [id number] [place] Patient Name: Dana Greer Gender: Female DOB: [birth year] Age: 45 Y 9 M 17 D Return Phone Number: [phone] (Primary) Address: City/State/Zip: [place] Client [place] [facility] - Client Client Site [name] [place] MD Contact Type Call Who Is Calling Patient / Member / Family / Caregiver Call Type Triage / Clinical Relationship To Patient Self Return Phone Number [phone] (Primary) Chief Complaint Vomiting Reason for Call Symptomatic / Request for Health Information Initial Comment She is still vomiting PreDisposition Go to ED Translation No Nurse Assessment Nurse: Larita Fife Date/Time [name] Time): 07/05/2016 1:49:49 AM Confirm and document reason for call. If symptomatic, describe symptoms. You must click the next button to save text entered. ---Caller states that she started vomiting about 10:00 pm tonight x 6. States that she has been around someone with strep throat. Has the patient traveled out of the country within the last 30 days? ---No Does the patient have any new or  worsening symptoms? ---Yes Will a triage be completed? ---Yes Related visit to physician within the last 2 weeks? ---No Does the PT have any chronic conditions? (i.e. diabetes, asthma, etc.) ---Yes List chronic conditions. ---HTN Is the patient pregnant or possibly pregnant? (Ask all females between the ages of 9-55) ---No Is this a behavioral health or substance abuse call? ---No Guidelines Guideline Title Affirmed Question Affirmed Notes Nurse Date/Time (Eastern Time) Vomiting [1] SEVERE vomiting (e.g., 6 or more times/ day) AND [2] present > 8 hours Larita Fife 07/05/2016 1:54:13 AM Disp. Time [name] Time) Disposition Final User 07/05/2016 1:55:27 AM Go to ED Now (or PCP triage) Yes Larita Fife PLEASE NOTE: All timestamps contained within this report are represented as [place] Standard Time. CONFIDENTIALTY NOTICE: This fax transmission is intended only for the addressee. It contains information that is legally privileged, confidential or otherwise protected from use or disclosure. If you are not the intended recipient, you are strictly prohibited from reviewing, disclosing, copying using or disseminating any of this information or taking any action in reliance on or regarding this information. If you have received this fax in error, please notify us immediately by telephone so that we can arrange for its return to [place]. Phone: [phone], Toll-Free: [phone], Fax: [phone] Page: 2 of 2 Call Id: [id number] [place] Understands: Yes Disagree/Comply: Comply Care Advice Given Per Guideline GO TO ED NOW (OR PCP TRIAGE): * IF NO PCP TRIAGE: You need to be seen. Go to the [facility] at [facility] within the next hour. Leave as soon as you can. CONTAINER: You may wish to bring a bucket or container with you in case there is more vomiting during the drive. BRING MEDICINES: * Please bring a list of your current medicines when you  go to see the doctor. Referrals [facility] - ED

## 2016-07-05 NOTE — ED Provider Notes (Signed)
[facility] DEPT Provider Note   CSN: [id number] Arrival date & time: 07/05/16  0240  By signing my name below, I, Dana Greer, attest that this documentation has been prepared under the direction and in the presence of [name], MD. Electronically Signed: Reola Greer, ED Scribe. 07/05/16. 4:04 AM.  History   Chief Complaint Chief Complaint  Patient presents with  . Emesis   The history is provided by the patient. No language interpreter was used.   HPI Comments: Dana Greer is a 45 y.o. female who presents to the Emergency Department complaining of intermittent episodes of nausea and NBNB emesis onset ~6 hours PTA. Pt reports associated epigastric abdominal pain and diarrhea since the onset of her symptoms. She states that she has had six episodes of emesis prior to coming into the ED. Pt has been taking antiemetics at home with minimal relief of her symptoms. No PSHx to the abdomen. Pt reports that she has a hx of same, and has been seen by a GI in the past for this issue with no specific diagnosis. Denies fevers, or any other associated symptoms.    Past Medical History:  Diagnosis Date  . Arthritis   . Depression   . Headache(784.0)   . HSV-2 infection   . Immune deficiency disorder ([place])   . Stroke ([place])   . Thrombocytopenia ([place]) 10/01/2012   Patient Active Problem List   Diagnosis Date Noted  . Renal insufficiency 03/08/2016  . Nausea with vomiting 03/08/2016  . Dehydration 03/08/2016  . Right knee pain 01/27/2016  . Adverse effects of medication 11/25/2015  . Vertigo 10/08/2015  . Pain of right thumb 03/16/2015  . Carpal tunnel syndrome 03/16/2015  . Left hip pain 02/17/2015  . Shoulder pain, left 02/17/2015  . Knee pain, bilateral 01/07/2015  . Gout 12/22/2014  . Visit for routine gyn exam 08/04/2014  . Visit for screening mammogram 08/04/2014  . Pain, joint, multiple sites 05/26/2014  . Vertiginous migraine 02/03/2014  .  Thrombocytopenia ([place]) 10/01/2012  . Intractable migraine 08/31/2012  . Osteoarthritis 05/11/2012  . Female bladder prolapse 05/11/2012  . ADJ DISORDER WITH MIXED ANXIETY & DEPRESSED MOOD 06/30/2008  . HYPERHIDROSIS 01/25/2008  . BACK PAIN, LUMBAR, CHRONIC 10/16/2007  . REACTION, ACUTE STRESS W/EMOTIONAL DSTURB 08/01/2007  . HYPERGLYCEMIA 08/01/2007  . ELEVATED BLOOD PRESSURE WITHOUT DIAGNOSIS OF HYPERTENSION 08/01/2007  . GOITER NOS 07/11/2007  . POLYCYSTIC OVARIAN DISEASE 05/24/2007  . HIRSUTISM 05/24/2007  . Arthropathy, multiple sites 05/24/2007   Past Surgical History:  Procedure Laterality Date  . ABDOMINAL HYSTERECTOMY    . CESAREAN SECTION  2008   x1  . PARTIAL HYSTERECTOMY  2012   due to fibroid  . UPPER GASTROINTESTINAL ENDOSCOPY    . WISDOM TOOTH EXTRACTION  1995   OB History    Gravida Para Term Preterm AB Living   3         3   SAB TAB Ectopic Multiple Live Births                  Obstetric Comments   Pt states that she has had a lot of miscarriages. Do not know how many     Home Medications    Prior to Admission medications   Medication Sig Start Date End Date Taking? Authorizing Provider  albuterol (PROVENTIL HFA;VENTOLIN HFA) 108 (90 Base) MCG/ACT inhaler Inhale 2 puffs into the lungs every 6 (six) hours as needed for wheezing or shortness of breath. 06/13/16  Jearld Fenton, NP  ALPRAZolam Duanne Moron) 1 MG tablet Take 0.5 mg by mouth 2 (two) times daily as needed for anxiety.     Historical Provider, MD  chlorpheniramine-HYDROcodone (TUSSIONEX PENNKINETIC ER) 10-8 MG/5ML SUER Take 5 mLs by mouth every 12 (twelve) hours as needed for cough. 06/13/16   Jearld Fenton, NP  cyclobenzaprine (FLEXERIL) 10 MG tablet Take 1/2 to1 pill up tid prn for headache or muscle pain Patient taking differently: Take 5-10 mg by mouth 3 (three) times daily as needed (headaches or muscle pain).  02/17/15   Abner Greenspan, MD  dicyclomine (BENTYL) 10 MG capsule Take 1 capsule (10 mg  total) by mouth daily. Patient taking differently: Take 10 mg by mouth daily as needed.  10/05/15   Gatha Mayer, MD  etonogestrel (IMPLANON) 68 MG IMPL implant Inject 1 each into the skin continuous. Reported on 01/27/2016    Historical Provider, MD  fluconazole (DIFLUCAN) 150 MG tablet Take 1 tablet (150 mg total) by mouth once. 05/18/16   Abner Greenspan, MD  fluticasone (FLONASE) 50 MCG/ACT nasal spray Place 2 sprays into both nostrils daily. Patient taking differently: Place 2 sprays into both nostrils daily as needed.  04/07/14   Abner Greenspan, MD  gabapentin (NEURONTIN) 300 MG capsule Take 1 capsule (300 mg total) by mouth 3 (three) times daily. 11/25/15   Abner Greenspan, MD  HYDROcodone-acetaminophen (NORCO/VICODIN) 5-325 MG per tablet Take 1 tablet by mouth every 6 (six) hours as needed for moderate pain. 04/03/15   Jearld Fenton, NP  indomethacin (INDOCIN) 50 MG capsule Take 1 capsule (50 mg total) by mouth 3 (three) times daily with meals. As needed for gout pain 01/13/16   Abner Greenspan, MD  MECLIZINE HCL PO Take 1 tablet by mouth at bedtime as needed (for vertigo).     Historical Provider, MD  mirabegron ER (MYRBETRIQ) 50 MG TB24 tablet Take 1 tablet (50 mg total) by mouth daily. 03/08/16   Abner Greenspan, MD  omeprazole (PRILOSEC) 40 MG capsule Take 1 capsule (40 mg total) by mouth daily. 11/29/14   Quintella Reichert, MD  ondansetron (ZOFRAN ODT) 4 MG disintegrating tablet Take 1 tablet (4 mg total) by mouth every 8 (eight) hours as needed for nausea or vomiting. 07/05/16   [name], MD  PARoxetine (PAXIL) 40 MG tablet Take 1 tablet (40 mg total) by mouth daily. 05/26/14   Abner Greenspan, MD  promethazine (PHENERGAN) 25 MG tablet Take 1 tablet (25 mg total) by mouth every 8 (eight) hours as needed for nausea or vomiting. 04/06/16   Abner Greenspan, MD  ranitidine (ZANTAC) 75 MG tablet Take 75 mg by mouth every morning.    Historical Provider, MD  tetrahydrozoline 0.05 % ophthalmic solution Place 1 drop into  both eyes daily as needed (for dry eyes).    Historical Provider, MD  triamterene-hydrochlorothiazide (DYAZIDE) 37.5-25 MG capsule Take 1 capsule by mouth daily. 01/12/16   Historical Provider, MD  valACYclovir (VALTREX) 500 MG tablet Take 1 tablet (500 mg total) by mouth daily. 11/04/15   Abner Greenspan, MD   Family History Family History  Problem Relation Age of Onset  . Diabetes Father   . Breast cancer Paternal Aunt   . Colon cancer Paternal Grandmother    Social History Social History  Substance Use Topics  . Smoking status: Never Smoker  . Smokeless tobacco: Never Used     Comment: Pt states she smokes.....but "not  cigarettes"  . Alcohol use 1.2 oz/week    2 Glasses of wine per week     Comment: occ   Allergies   Trazodone and nefazodone; Amoxicillin; and Carafate [sucralfate]  Review of Systems Review of Systems A complete 10 system review of systems was obtained and all systems are negative except as noted in the HPI and PMH.   Physical Exam Updated Vital Signs BP (!) 173/145   Pulse 60   Temp 97.2 F (36.2 C)   Resp 18   Ht 5\' 3"  (1.6 m)   Wt 190 lb (86.2 kg)   SpO2 100%   BMI 33.66 kg/m   Physical Exam  Constitutional: She is oriented to person, place, and time. She appears well-developed and well-nourished. No distress.  HENT:  Head: Normocephalic and atraumatic.  Nose: Nose normal.  Mouth/Throat: Oropharynx is clear and moist. No oropharyngeal exudate.  Eyes: Conjunctivae and EOM are normal. Pupils are equal, round, and reactive to light. No scleral icterus.  Neck: Normal range of motion. Neck supple. No JVD present. No tracheal deviation present. No thyromegaly present.  Cardiovascular: Normal rate, regular rhythm and normal heart sounds.  Exam reveals no gallop and no friction rub.   No murmur heard. Pulmonary/Chest: Effort normal and breath sounds normal. No respiratory distress. She has no wheezes. She exhibits no tenderness.  Abdominal: Soft. Bowel  sounds are normal. She exhibits no distension and no mass. There is no tenderness. There is no rebound and no guarding.  Musculoskeletal: Normal range of motion. She exhibits no edema or tenderness.  Lymphadenopathy:    She has no cervical adenopathy.  Neurological: She is alert and oriented to person, place, and time. No cranial nerve deficit. She exhibits normal muscle tone.  Skin: Skin is warm and dry. No rash noted. No erythema. No pallor.  Nursing note and vitals reviewed.  ED Treatments / Results  DIAGNOSTIC STUDIES: Oxygen Saturation is 100% on RA, normal by my interpretation.   COORDINATION OF CARE: 3:47 AM-Discussed next steps with pt. Pt verbalized understanding and is agreeable with the plan.   Labs (all labs ordered are listed, but only abnormal results are displayed) Labs Reviewed  COMPREHENSIVE METABOLIC PANEL - Abnormal; Notable for the following:       Result Value   Potassium 3.3 (*)    CO2 21 (*)    Glucose, Bld 170 (*)    Creatinine, Ser 1.06 (*)    All other components within normal limits  URINALYSIS, ROUTINE W REFLEX MICROSCOPIC (NOT AT [facility]) - Abnormal; Notable for the following:    Color, Urine STRAW (*)    Glucose, UA 100 (*)    Hgb urine dipstick MODERATE (*)    Ketones, ur >80 (*)    Protein, ur 30 (*)    All other components within normal limits  URINE MICROSCOPIC-ADD ON - Abnormal; Notable for the following:    Squamous Epithelial / LPF 6-30 (*)    Bacteria, UA FEW (*)    All other components within normal limits  LIPASE, BLOOD  CBC   EKG  EKG Interpretation None      Radiology No results found.  Procedures Procedures (including critical care time)  Medications Ordered in ED Medications  ondansetron (ZOFRAN) injection 4 mg (4 mg Intravenous Given 07/05/16 0441)  promethazine (PHENERGAN) injection 12.5 mg (12.5 mg Intravenous Given 07/05/16 0441)  sodium chloride 0.9 % bolus 1,000 mL (1,000 mLs Intravenous New Bag/Given 07/05/16 0441)     Initial Impression /  Assessment and Plan / ED Course  I have reviewed the triage vital signs and the nursing notes.  Pertinent labs & imaging results that were available during my care of the patient were reviewed by me and considered in my medical decision making (see chart for details).  Clinical Course    Patient presents to the ED for vomiting and diarrhea.  She states zofran and phenergan normally work for her.  This was ordered. Labs are unremarkable.  This is chronic and has been occurring for 2 years.   She states her GI doctor could not tell her what is going on.  Abdominal exam is benign, no need for advance imaging at this time.     5:33 AM Upon repeat evaluation, patient continues to appear well and in NAD.  No episodes of vomiting or diarrhea in the ED.  PCP fu advised for further care.  VS remain within her normal limits and she is safe for DC.   Final Clinical Impressions(s) / ED Diagnoses   Final diagnoses:  Nausea vomiting and diarrhea    New Prescriptions New Prescriptions   ONDANSETRON (ZOFRAN ODT) 4 MG DISINTEGRATING TABLET    Take 1 tablet (4 mg total) by mouth every 8 (eight) hours as needed for nausea or vomiting.    I personally performed the services described in this documentation, which was scribed in my presence. The recorded information has been reviewed and is accurate.      [name], MD 07/05/16 [phone]

## 2016-07-05 NOTE — ED Notes (Signed)
Dr. Oni at bedside. 

## 2016-07-05 NOTE — Telephone Encounter (Signed)
Per chart review tab pt was seen at [facility] ED 07/05/16.

## 2016-07-05 NOTE — ED Triage Notes (Signed)
Pt states that she has been vomiting since about 10am today. States she has vomited 6 times today, and c/o of diarrhea for the pas 24 hours. Denies fevers.

## 2016-07-05 NOTE — Telephone Encounter (Signed)
PLEASE NOTE: All timestamps contained within this report are represented as [place] Standard Time. CONFIDENTIALTY NOTICE: This fax transmission is intended only for the addressee. It contains information that is legally privileged, confidential or otherwise protected from use or disclosure. If you are not the intended recipient, you are strictly prohibited from reviewing, disclosing, copying using or disseminating any of this information or taking any action in reliance on or regarding this information. If you have received this fax in error, please notify us immediately by telephone so that we can arrange for its return to [place]. Phone: [phone], Toll-Free: [phone], Fax: [phone] Page: 1 of 2 Call Id: [id number] [place] Patient Name: Dana Greer Gender: Female DOB: 04/21/1971 Age: 45 Y 9 M 17 D Return Phone Number: [phone] (Primary) Address: City/State/Zip: [place] Client [place] - Client Client Site [place] MD Contact Type Call Who Is Calling Patient / Member / Family / Caregiver Call Type Triage / Clinical Relationship To Patient Self Return Phone Number [phone] (Primary) Chief Complaint Vomiting Reason for Call Symptomatic / Request for [place] has been throwing up, this is normal spells that she has, she gets dehydrated. She usually takes Education officer, environmental Disposition EMR Appointment Not Necessary Info pasted into Epic No PreDisposition Go to ED Translation No Nurse Assessment Nurse: Larita Fife Date/Time (Eastern Time): 07/04/2016 11:20:53 PM Confirm and document reason for call. If symptomatic, describe symptoms. You must click the next button to save text entered. ---Caller has been throwing up, since earlier this evening  and having diarrhea for about 2 days and states that states that this is normal for her and this has been going on for at least 2 years and states that she gets dehydrated. She usually takes Promethazine and Zofran. States she has been to a GI doctor about this and states they cannot "figure out why she is vomiting." Has the patient traveled out of the country within the last 30 days? ---No Does the patient have any new or worsening symptoms? ---Yes Will a triage be completed? ---Yes Related visit to physician within the last 2 weeks? ---No Does the PT have any chronic conditions? (i.e. diabetes, asthma, etc.) ---Yes List chronic conditions. ---Migraines, hx of vomiting issues Is the patient pregnant or possibly pregnant? (Ask all females between the ages of 21-55) ---No Is this a behavioral health or substance abuse call? ---No PLEASE NOTE: All timestamps contained within this report are represented as [place] Standard Time. CONFIDENTIALTY NOTICE: This fax transmission is intended only for the addressee. It contains information that is legally privileged, confidential or otherwise protected from use or disclosure. If you are not the intended recipient, you are strictly prohibited from reviewing, disclosing, copying using or disseminating any of this information or taking any action in reliance on or regarding this information. If you have received this fax in error, please notify us immediately by telephone so that we can arrange for its return to [place]. Phone: [phone], Toll-Free: [phone], Fax: [phone] Page: 2 of 2 Call Id: [id number] Guidelines Guideline Title Affirmed Question Affirmed Notes Nurse Date/Time [name] Time) Vomiting Vomiting is a chronic symptom (recurrent or ongoing AND present > 4 weeks) Larita Fife 07/04/2016 11:26:42 PM Disp. Time [name] Time) Disposition Final User 07/04/2016 11:33:10 PM See PCP within 2 Weeks Yes Larita Fife Caller Understands:  Yes  Disagree/Comply: Comply Care Advice Given Per Guideline SEE PCP WITHIN 2 WEEKS: You need an evaluation for this ongoing problem within the next 2 weeks. Call your doctor during regular office hours and make an appointment. VOMITING DIARY: Keep a diary of your vomiting: Include the date, time, place, and what you ate in the previous 2 hours. (Reason: try to find some of the triggers.) CLEAR LIQUIDS: Try to sip small amounts (1 tablespoon or 15 ml) of liquid frequently (every 5 minutes) for 8 hours, rather than trying to drink a lot of liquid all at one time. * Other options: 1/2 strength flat lemon-lime soda or ginger ale. * Sip water or a 1/2 stength sports drink (e.g., Gatorade or Powerade). * After 4 hours without vomiting, increase the amount. SOLID FOOD: * You may begin eating bland foods after 8 hours without vomiting. * Start with saltine crackers, white bread, rice, mashed potatoes, cereal, applesauce, etc. * You can resume a normal diet in 24-48 hours. AVOID NON-ESSENTIAL MEDS: * Discontinue all vitamins and non-prescription medicines for 24 hours. (Reason: may make vomiting worse.) * Avoid NSAIDs, which can cause gastritis * Call if vomiting a prescription medicine. CALL BACK IF: * You become worse. CARE ADVICE per Vomiting (Adult) guideline. Comments User: Larita Fife Date/Time (Eastern Time): 07/04/2016 11:30:11 PM Caller states that she is on Xanax for anxiety issures. User: Larita Fife Date/Time (Eastern Time): 07/04/2016 11:33:37 PM Caller states that she is sitting in the ER parking lot at time of call. Referrals REFERRED TO PCP OFFICE

## 2016-07-06 NOTE — Telephone Encounter (Signed)
done

## 2016-07-06 NOTE — Telephone Encounter (Signed)
Please refill times 1 

## 2016-07-06 NOTE — Telephone Encounter (Signed)
Last filled on 04/06/16 #30 tabs with 0 refills, please advise

## 2016-07-17 ENCOUNTER — Encounter: Payer: Self-pay | Admitting: Family Medicine

## 2016-07-19 ENCOUNTER — Telehealth: Payer: Self-pay | Admitting: Family Medicine

## 2016-07-19 ENCOUNTER — Encounter: Payer: Self-pay | Admitting: Family Medicine

## 2016-07-19 DIAGNOSIS — G43109 Migraine with aura, not intractable, without status migrainosus: Secondary | ICD-10-CM

## 2016-07-19 DIAGNOSIS — R112 Nausea with vomiting, unspecified: Secondary | ICD-10-CM

## 2016-07-19 NOTE — Telephone Encounter (Signed)
Referral to neuro for vertiginous migraine with n/v  Will route to [facility]

## 2016-07-20 NOTE — Telephone Encounter (Signed)
Let's get her to f/u in the office. We can avoid oral meds, and I think that area will respond with a small injection of some cortisone.

## 2016-08-08 ENCOUNTER — Encounter: Payer: Self-pay | Admitting: Family Medicine

## 2016-08-15 ENCOUNTER — Telehealth: Payer: Self-pay | Admitting: Family Medicine

## 2016-08-15 DIAGNOSIS — G8929 Other chronic pain: Secondary | ICD-10-CM

## 2016-08-15 DIAGNOSIS — M5440 Lumbago with sciatica, unspecified side: Principal | ICD-10-CM

## 2016-08-15 NOTE — Telephone Encounter (Signed)
Ref to PT for acute on chronic back pain  Will route to [facility]

## 2016-08-17 ENCOUNTER — Other Ambulatory Visit: Payer: Self-pay | Admitting: Family Medicine

## 2016-08-17 DIAGNOSIS — B009 Herpesviral infection, unspecified: Secondary | ICD-10-CM | POA: Insufficient documentation

## 2016-08-17 NOTE — Telephone Encounter (Signed)
done

## 2016-08-17 NOTE — Telephone Encounter (Signed)
Last filled on 11/04/15 #30 tabs with 2 additional refills, please advise

## 2016-08-17 NOTE — Telephone Encounter (Signed)
Please refill times 5 

## 2016-08-24 ENCOUNTER — Ambulatory Visit: Payer: Medicare Other | Admitting: Neurology

## 2016-08-24 DIAGNOSIS — Z029 Encounter for administrative examinations, unspecified: Secondary | ICD-10-CM

## 2016-08-29 DIAGNOSIS — M6281 Muscle weakness (generalized): Secondary | ICD-10-CM | POA: Diagnosis not present

## 2016-08-29 DIAGNOSIS — M5441 Lumbago with sciatica, right side: Secondary | ICD-10-CM | POA: Diagnosis not present

## 2016-10-20 ENCOUNTER — Telehealth: Payer: Self-pay | Admitting: Family Medicine

## 2016-10-20 DIAGNOSIS — D696 Thrombocytopenia, unspecified: Secondary | ICD-10-CM

## 2016-10-20 DIAGNOSIS — R7309 Other abnormal glucose: Secondary | ICD-10-CM

## 2016-10-20 DIAGNOSIS — E049 Nontoxic goiter, unspecified: Secondary | ICD-10-CM

## 2016-10-20 DIAGNOSIS — Z13220 Encounter for screening for lipoid disorders: Secondary | ICD-10-CM | POA: Insufficient documentation

## 2016-10-20 DIAGNOSIS — N289 Disorder of kidney and ureter, unspecified: Secondary | ICD-10-CM

## 2016-10-20 DIAGNOSIS — M109 Gout, unspecified: Secondary | ICD-10-CM

## 2016-10-20 NOTE — Telephone Encounter (Signed)
-----   Message from Eustace Pen, LPN sent at [phone]  4:26 PM EST ----- Regarding: Lab Orders 10/21/16 Please place lab orders. Thank you.

## 2016-10-21 ENCOUNTER — Ambulatory Visit (INDEPENDENT_AMBULATORY_CARE_PROVIDER_SITE_OTHER): Payer: Medicare Other

## 2016-10-21 VITALS — BP 130/90 | HR 68 | Temp 98.7°F | Ht 62.5 in | Wt 212.5 lb

## 2016-10-21 DIAGNOSIS — D696 Thrombocytopenia, unspecified: Secondary | ICD-10-CM | POA: Diagnosis not present

## 2016-10-21 DIAGNOSIS — Z0000 Encounter for general adult medical examination without abnormal findings: Secondary | ICD-10-CM

## 2016-10-21 DIAGNOSIS — N289 Disorder of kidney and ureter, unspecified: Secondary | ICD-10-CM | POA: Diagnosis not present

## 2016-10-21 DIAGNOSIS — Z13220 Encounter for screening for lipoid disorders: Secondary | ICD-10-CM

## 2016-10-21 DIAGNOSIS — R7309 Other abnormal glucose: Secondary | ICD-10-CM

## 2016-10-21 DIAGNOSIS — E049 Nontoxic goiter, unspecified: Secondary | ICD-10-CM

## 2016-10-21 LAB — COMPREHENSIVE METABOLIC PANEL
ALK PHOS: 35 U/L — AB (ref 39–117)
ALT: 19 U/L (ref 0–35)
AST: 21 U/L (ref 0–37)
Albumin: 3.8 g/dL (ref 3.5–5.2)
BILIRUBIN TOTAL: 0.3 mg/dL (ref 0.2–1.2)
BUN: 13 mg/dL (ref 6–23)
CO2: 29 mEq/L (ref 19–32)
Calcium: 8.9 mg/dL (ref 8.4–10.5)
Chloride: 106 mEq/L (ref 96–112)
Creatinine, Ser: 0.95 mg/dL (ref 0.40–1.20)
GFR: 81.41 mL/min (ref >60.00)
GLUCOSE: 97 mg/dL (ref 70–99)
Potassium: 4.2 mEq/L (ref 3.5–5.1)
SODIUM: 139 meq/L (ref 135–145)
TOTAL PROTEIN: 6.7 g/dL (ref 6.0–8.3)

## 2016-10-21 LAB — CBC WITH DIFFERENTIAL/PLATELET
BASOS ABS: 0 10*3/uL (ref 0.0–0.1)
Basophils Relative: 0.3 % (ref 0.0–3.0)
Eosinophils Absolute: 0.1 10*3/uL (ref 0.0–0.7)
Eosinophils Relative: 1.8 % (ref 0.0–5.0)
HCT: 36.6 % (ref 36.0–46.0)
Hemoglobin: 12.5 g/dL (ref 12.0–15.0)
LYMPHS ABS: 2.1 10*3/uL (ref 0.7–4.0)
Lymphocytes Relative: 32.5 % (ref 12.0–46.0)
MCHC: 34.2 g/dL (ref 30.0–36.0)
MCV: 98.2 fl (ref 78.0–100.0)
MONO ABS: 0.3 10*3/uL (ref 0.1–1.0)
Monocytes Relative: 5.2 % (ref 3.0–12.0)
NEUTROS PCT: 60.2 % (ref 43.0–77.0)
Neutro Abs: 3.9 10*3/uL (ref 1.4–7.7)
PLATELETS: 128 10*3/uL — AB (ref 150.0–400.0)
RBC: 3.73 Mil/uL — AB (ref 3.87–5.11)
RDW: 14.6 % (ref 11.5–15.5)
WBC: 6.4 10*3/uL (ref 4.0–10.5)

## 2016-10-21 LAB — TSH: TSH: 0.86 u[IU]/mL (ref 0.35–4.50)

## 2016-10-21 LAB — HEMOGLOBIN A1C: HEMOGLOBIN A1C: 5.4 % (ref 4.6–6.5)

## 2016-10-21 LAB — LIPID PANEL
CHOLESTEROL: 101 mg/dL (ref 0–200)
HDL: 44.5 mg/dL (ref >39.00)
LDL CALC: 49 mg/dL (ref 0–99)
NonHDL: 56.8
Total CHOL/HDL Ratio: 2
Triglycerides: 37 mg/dL (ref 0.0–149.0)
VLDL: 7.4 mg/dL (ref 0.0–40.0)

## 2016-10-21 NOTE — Progress Notes (Signed)
PCP notes:   Health maintenance:  Flu vaccine - postponed/pt will take at CPE Tetanus - postponed/insurance  Abnormal screenings:   Depression score: 12  Patient concerns:   None  Nurse concerns:  None  Next PCP appt:   10/25/16 @ 0815  I reviewed health advisor's note, was available for consultation, and agree with documentation and plan. Loura Pardon MD

## 2016-10-21 NOTE — Patient Instructions (Signed)
[name] , Thank you for taking time to come for your Medicare Wellness Visit. I appreciate your ongoing commitment to your health goals. Please review the following plan we discussed and let me know if I can assist you in the future.   These are the goals we discussed: Goals    . Increase physical activity          Starting 10/21/2016, I will continue to exercise 30-60 min 2-3 days per week.        This is a list of the screening recommended for you and due dates:  Health Maintenance  Topic Date Due  . Flu Shot  01/28/2017*  . Tetanus Vaccine  10/21/2026*  . HIV Screening  Completed  *Topic was postponed. The date shown is not the original due date.   Preventive Care for Adults  A healthy lifestyle and preventive care can promote health and wellness. Preventive health guidelines for adults include the following key practices.  . A routine yearly physical is a good way to check with your health care provider about your health and preventive screening. It is a chance to share any concerns and updates on your health and to receive a thorough exam.  . Visit your dentist for a routine exam and preventive care every 6 months. Brush your teeth twice a day and floss once a day. Good oral hygiene prevents tooth decay and gum disease.  . The frequency of eye exams is based on your age, health, family medical history, use  of contact lenses, and other factors. Follow your health care provider's ecommendations for frequency of eye exams.  . Eat a healthy diet. Foods like vegetables, fruits, whole grains, low-fat dairy products, and lean protein foods contain the nutrients you need without too many calories. Decrease your intake of foods high in solid fats, added sugars, and salt. Eat the right amount of calories for you. Get information about a proper diet from your health care provider, if necessary.  . Regular physical exercise is one of the most important things you can do for your  health. Most adults should get at least 150 minutes of moderate-intensity exercise (any activity that increases your heart rate and causes you to sweat) each week. In addition, most adults need muscle-strengthening exercises on 2 or more days a week.  Silver Sneakers may be a benefit available to you. To determine eligibility, you may visit the website: www.silversneakers.com or contact program at 437-462-0021 Mon-Fri between 8AM-8PM.   . Maintain a healthy weight. The body mass index (BMI) is a screening tool to identify possible weight problems. It provides an estimate of body fat based on height and weight. Your health care provider can find your BMI and can help you achieve or maintain a healthy weight.   For adults 20 years and older: ? A BMI below 18.5 is considered underweight. ? A BMI of 18.5 to 24.9 is normal. ? A BMI of 25 to 29.9 is considered overweight. ? A BMI of 30 and above is considered obese.   . Maintain normal blood lipids and cholesterol levels by exercising and minimizing your intake of saturated fat. Eat a balanced diet with plenty of fruit and vegetables. Blood tests for lipids and cholesterol should begin at age 46 and be repeated every 5 years. If your lipid or cholesterol levels are high, you are over 50, or you are at high risk for heart disease, you may need your cholesterol levels checked more frequently. Ongoing  high lipid and cholesterol levels should be treated with medicines if diet and exercise are not working.  . If you smoke, find out from your health care provider how to quit. If you do not use tobacco, please do not start.  . If you choose to drink alcohol, please do not consume more than 2 drinks per day. One drink is considered to be 12 ounces (355 mL) of beer, 5 ounces (148 mL) of wine, or 1.5 ounces (44 mL) of liquor.  . If you are 82-45 years old, ask your health care provider if you should take aspirin to prevent strokes.  . Use sunscreen. Apply  sunscreen liberally and repeatedly throughout the day. You should seek shade when your shadow is shorter than you. Protect yourself by wearing long sleeves, pants, a wide-brimmed hat, and sunglasses year round, whenever you are outdoors.  . Once a month, do a whole body skin exam, using a mirror to look at the skin on your back. Tell your health care provider of new moles, moles that have irregular borders, moles that are larger than a pencil eraser, or moles that have changed in shape or color.

## 2016-10-21 NOTE — Progress Notes (Signed)
Subjective:   Dana Greer is a 45 y.o. female who presents for an Initial Medicare Annual Wellness Visit.  Review of Systems    N/A  Cardiac Risk Factors include: obesity (BMI >30kg/m2)     Objective:    Today's Vitals   10/21/16 0926  BP: 130/90  Pulse: 68  Temp: 98.7 F (37.1 C)  TempSrc: Oral  SpO2: 98%  Weight: 212 lb 8 oz (96.4 kg)  Height: 5' 2.5" (1.588 m)  PainSc: 8   PainLoc: Head   Body mass index is 38.25 kg/m.   Current Medications (verified) Outpatient Encounter Prescriptions as of 10/21/2016  Medication Sig  . ALPRAZolam (XANAX) 1 MG tablet Take 0.5 mg by mouth QID.   Marland Kitchen CLONIDINE HCL PO Take 0.5 mg by mouth daily.  Marland Kitchen dicyclomine (BENTYL) 10 MG capsule Take 1 capsule (10 mg total) by mouth daily. (Patient taking differently: Take 10 mg by mouth daily as needed. )  . etonogestrel (IMPLANON) 68 MG IMPL implant Inject 1 each into the skin continuous. Reported on 01/27/2016  . gabapentin (NEURONTIN) 300 MG capsule Take 1 capsule (300 mg total) by mouth 3 (three) times daily.  . MECLIZINE HCL PO Take 1 tablet by mouth at bedtime as needed (for vertigo).   . ondansetron (ZOFRAN ODT) 4 MG disintegrating tablet Take 1 tablet (4 mg total) by mouth every 8 (eight) hours as needed for nausea or vomiting.  Marland Kitchen PARoxetine (PAXIL) 40 MG tablet Take 1 tablet (40 mg total) by mouth daily.  . promethazine (PHENERGAN) 25 MG tablet TAKE ONE TABLET BY MOUTH EVERY EIGHT HOURS AS NEEDED FOR NAUSEA OR VOMITING  . ranitidine (ZANTAC) 75 MG tablet Take 75 mg by mouth every morning.  Marland Kitchen tetrahydrozoline 0.05 % ophthalmic solution Place 1 drop into both eyes daily as needed (for dry eyes).  . valACYclovir (VALTREX) 500 MG tablet TAKE 1 TABLET BY MOUTH DAILY  . fluticasone (FLONASE) 50 MCG/ACT nasal spray Place 2 sprays into both nostrils daily. (Patient not taking: Reported on 10/21/2016)  . [DISCONTINUED] albuterol (PROVENTIL HFA;VENTOLIN HFA) 108 (90 Base) MCG/ACT inhaler  Inhale 2 puffs into the lungs every 6 (six) hours as needed for wheezing or shortness of breath.  . [DISCONTINUED] chlorpheniramine-HYDROcodone (TUSSIONEX PENNKINETIC ER) 10-8 MG/5ML SUER Take 5 mLs by mouth every 12 (twelve) hours as needed for cough.  . [DISCONTINUED] cyclobenzaprine (FLEXERIL) 10 MG tablet Take 1/2 to1 pill up tid prn for headache or muscle pain (Patient taking differently: Take 5-10 mg by mouth 3 (three) times daily as needed (headaches or muscle pain). )  . [DISCONTINUED] fluconazole (DIFLUCAN) 150 MG tablet Take 1 tablet (150 mg total) by mouth once.  . [DISCONTINUED] HYDROcodone-acetaminophen (NORCO/VICODIN) 5-325 MG per tablet Take 1 tablet by mouth every 6 (six) hours as needed for moderate pain.  . [DISCONTINUED] indomethacin (INDOCIN) 50 MG capsule Take 1 capsule (50 mg total) by mouth 3 (three) times daily with meals. As needed for gout pain  . [DISCONTINUED] mirabegron ER (MYRBETRIQ) 50 MG TB24 tablet Take 1 tablet (50 mg total) by mouth daily.  . [DISCONTINUED] omeprazole (PRILOSEC) 40 MG capsule Take 1 capsule (40 mg total) by mouth daily.  . [DISCONTINUED] triamterene-hydrochlorothiazide (DYAZIDE) 37.5-25 MG capsule Take 1 capsule by mouth daily.   No facility-administered encounter medications on file as of 10/21/2016.     Allergies (verified) Trazodone and nefazodone; Amoxicillin; and Carafate [sucralfate]   History: Past Medical History:  Diagnosis Date  . Arthritis   .  Depression   . Headache(784.0)   . HSV-2 infection   . Immune deficiency disorder ([place])   . Stroke ([place])   . Thrombocytopenia ([place]) 10/01/2012   Past Surgical History:  Procedure Laterality Date  . ABDOMINAL HYSTERECTOMY    . CESAREAN SECTION  2008   x1  . PARTIAL HYSTERECTOMY  2012   due to fibroid  . UPPER GASTROINTESTINAL ENDOSCOPY    . WISDOM TOOTH EXTRACTION  1995   Family History  Problem Relation Age of Onset  . Diabetes Father   . Breast cancer Paternal Aunt   . Colon  cancer Paternal Grandmother    Social History   Occupational History  . Collections, [employer]   Social History Main Topics  . Smoking status: Never Smoker  . Smokeless tobacco: Never Used     Comment: Pt states she smokes.....but "not cigarettes"  . Alcohol use 1.2 oz/week    2 Glasses of wine per week     Comment: occ  . Drug use:     Types: Marijuana  . Sexual activity: Yes    Birth control/ protection: None    Tobacco Counseling Counseling given: No   Activities of Daily Living In your present state of health, do you have any difficulty performing the following activities: 10/21/2016  Hearing? Y  Vision? Y  Difficulty concentrating or making decisions? N  Walking or climbing stairs? Y  Dressing or bathing? N  Doing errands, shopping? N  Preparing Food and eating ? N  Using the Toilet? N  In the past six months, have you accidently leaked urine? Y  Do you have problems with loss of bowel control? N  Managing your Medications? N  Managing your Finances? N  Housekeeping or managing your Housekeeping? N  Some recent data might be hidden    Immunizations and Health Maintenance Immunization History  Administered Date(s) Administered  . Influenza,inj,Quad PF,36+ Mos 11/25/2015   There are no preventive care reminders to display for this patient.  Patient Care Team: Abner Greenspan, MD as PCP - General (Family Medicine) Marcial Pacas, MD as Attending Physician (Neurology)     Assessment:   This is a routine wellness examination for Dana Greer.   Hearing/Vision screen Hearing Screening Comments: Pt reports hearing test completed in 2017 by ENT Vision Screening Comments: Last vision exam in July 2017  Dietary issues and exercise activities discussed: Current Exercise Habits: Home exercise routine, Type of exercise: yoga;Other - see comments (cardio), Time (Minutes): 60, Frequency (Times/Week): 3, Weekly Exercise (Minutes/Week): 180, Intensity: Moderate, Exercise  limited by: None identified  Goals    . Increase physical activity          Starting 10/21/2016, I will continue to exercise 30-60 min 2-3 days per week.       Depression Screen PHQ 2/9 Scores 10/21/2016 01/27/2016  PHQ - 2 Score 5 0  PHQ- 9 Score 12 -    Fall Risk Fall Risk  10/21/2016 01/27/2016  Falls in the past year? No No    Cognitive Function: MMSE - Mini Mental State Exam 10/21/2016  Orientation to time 5  Orientation to Place 5  Registration 3  Attention/ Calculation 0  Recall 3  Language- name 2 objects 0  Language- repeat 1  Language- follow 3 step command 3  Language- read & follow direction 0  Write a sentence 0  Copy design 0  Total score 20   PLEASE NOTE: A Mini-Cog screen was completed. Maximum score is 20.  A value of 0 denotes this part of [place] MMSE was not completed or the patient failed this part of the Mini-Cog screening.   Mini-Cog Screening Orientation to Time - Max 5 pts Orientation to Place - Max 5 pts Registration - Max 3 pts Recall - Max 3 pts Language Repeat - Max 1 pts Language Follow 3 Step Command - Max 3 pts     Screening Tests Health Maintenance  Topic Date Due  . INFLUENZA VACCINE  01/28/2017 (Originally 05/31/2016)  . TETANUS/TDAP  10/21/2026 (Originally 09/18/1989)  . HIV Screening  Completed      Plan:     I have personally reviewed and addressed the Medicare Annual Wellness questionnaire and have noted the following in the patient's chart:  A. Medical and social history B. Use of alcohol, tobacco or illicit drugs  C. Current medications and supplements D. Functional ability and status E.  Nutritional status F.  Physical activity G. Advance directives H. List of other physicians I.  Hospitalizations, surgeries, and ER visits in previous 12 months J.  [place] to include hearing, vision, cognitive, depression L. Referrals and appointments - none  In addition, I have reviewed and discussed with  patient certain preventive protocols, quality metrics, and best practice recommendations. A written personalized care plan for preventive services as well as general preventive health recommendations were provided to patient.  See attached scanned questionnaire for additional information.   Signed,   Lindell Noe, MHA, BS, LPN Health Coach

## 2016-10-21 NOTE — Progress Notes (Signed)
Pre visit review using our clinic review tool, if applicable. No additional management support is needed unless otherwise documented below in the visit note. 

## 2016-10-25 ENCOUNTER — Ambulatory Visit (INDEPENDENT_AMBULATORY_CARE_PROVIDER_SITE_OTHER): Payer: Medicare Other | Admitting: Family Medicine

## 2016-10-25 ENCOUNTER — Other Ambulatory Visit: Payer: Self-pay | Admitting: Family Medicine

## 2016-10-25 ENCOUNTER — Encounter: Payer: Self-pay | Admitting: Family Medicine

## 2016-10-25 VITALS — BP 120/85 | HR 67 | Temp 99.0°F | Ht 62.5 in | Wt 206.0 lb

## 2016-10-25 DIAGNOSIS — Z1231 Encounter for screening mammogram for malignant neoplasm of breast: Secondary | ICD-10-CM | POA: Insufficient documentation

## 2016-10-25 DIAGNOSIS — G8929 Other chronic pain: Secondary | ICD-10-CM

## 2016-10-25 DIAGNOSIS — Z23 Encounter for immunization: Secondary | ICD-10-CM | POA: Diagnosis not present

## 2016-10-25 DIAGNOSIS — I10 Essential (primary) hypertension: Secondary | ICD-10-CM | POA: Diagnosis not present

## 2016-10-25 DIAGNOSIS — D696 Thrombocytopenia, unspecified: Secondary | ICD-10-CM | POA: Diagnosis not present

## 2016-10-25 DIAGNOSIS — F4323 Adjustment disorder with mixed anxiety and depressed mood: Secondary | ICD-10-CM

## 2016-10-25 DIAGNOSIS — M791 Myalgia: Secondary | ICD-10-CM

## 2016-10-25 DIAGNOSIS — Z13220 Encounter for screening for lipoid disorders: Secondary | ICD-10-CM | POA: Diagnosis not present

## 2016-10-25 DIAGNOSIS — M7918 Myalgia, other site: Secondary | ICD-10-CM

## 2016-10-25 DIAGNOSIS — R7309 Other abnormal glucose: Secondary | ICD-10-CM

## 2016-10-25 NOTE — Progress Notes (Signed)
Subjective:    Patient ID: Dana Greer, female    DOB: [birth year], 45 y.o.   MRN: [id number]  HPI Here for annual f/u of chronic medical problems   Wt Readings from Last 3 Encounters:  10/25/16 206 lb (93.4 kg)  10/21/16 212 lb 8 oz (96.4 kg)  07/05/16 190 lb (86.2 kg)   Down 6 lb  Cannot exercise much due to chronic pain syndrome Is beginning to start some stretching (yoga program)    Had AMW on 12/22  Needs a flu vaccine  Will get it today    Tetanus - will see if covered in a pharmacy   Depression score 12  Seeing psychiatry  Thinks she is doing ok  In the process of getting set up with a counselor     Mammogram - along time ago / 5-6 years ago  Wants to get mammogram  She tends to have sensitive breasts , her breasts tend to hurt all the time  Unsure if she wants a mammogram at this time / she does not want to go to [facility]- she is due for a visit ([place])  Has implanon  Wants to have a hysterectomy - still has cramping    Colon cancer screening - gm with colon cancer  No first degree relative with colon cancer   Hx of PCOS  Hx of migraine and chronic nausea    Chemistry      Component Value Date/Time   NA 139 10/21/2016 1006   K 4.2 10/21/2016 1006   CL 106 10/21/2016 1006   CO2 29 10/21/2016 1006   BUN 13 10/21/2016 1006   CREATININE 0.95 10/21/2016 1006      Component Value Date/Time   CALCIUM 8.9 10/21/2016 1006   ALKPHOS 35 (L) 10/21/2016 1006   AST 21 10/21/2016 1006   ALT 19 10/21/2016 1006   BILITOT 0.3 10/21/2016 1006       Hx of hyperglycemia Lab Results  Component Value Date   HGBA1C 5.4 10/21/2016   is concerned about risk of DM  Both parents are diabetic  She watches her diet for sugar and carbs    Hx of labile bp BP Readings from Last 3 Encounters:  10/25/16 (!) 134/92  10/21/16 130/90  07/05/16 177/86  she takes clonidine - taking it once per day  BP: 120/85  Improved on 2nd  check   Past hx of thrombocytopenia Lab Results  Component Value Date   WBC 6.4 10/21/2016   HGB 12.5 10/21/2016   HCT 36.6 10/21/2016   MCV 98.2 10/21/2016   PLT 128.0 (L) 10/21/2016  she bruises easily baseline  She has been to hematology for this    Cholesterol screen Lab Results  Component Value Date   CHOL 101 10/21/2016   CHOL 100 07/25/2007   Lab Results  Component Value Date   HDL 44.50 10/21/2016   HDL 39.5 07/25/2007   Lab Results  Component Value Date   LDLCALC 49 10/21/2016   LDLCALC 54 07/25/2007   Lab Results  Component Value Date   TRIG 37.0 10/21/2016   TRIG 31 07/25/2007   Lab Results  Component Value Date   CHOLHDL 2 10/21/2016   CHOLHDL 2.5 CALC 07/25/2007   No results found for: LDLDIRECT  Lab Results  Component Value Date   TSH 0.86 10/21/2016     Drinks a lot of water   Patient Active Problem List   Diagnosis Date  Noted  . Screening mammogram, encounter for 10/25/2016  . Screening for lipoid disorders 10/20/2016  . HSV-2 infection 08/17/2016  . Renal insufficiency 03/08/2016  . Nausea with vomiting 03/08/2016  . Dehydration 03/08/2016  . Right knee pain 01/27/2016  . Adverse effects of medication 11/25/2015  . Vertigo 10/08/2015  . Pain of right thumb 03/16/2015  . Carpal tunnel syndrome 03/16/2015  . Left hip pain 02/17/2015  . Shoulder pain, left 02/17/2015  . Knee pain, bilateral 01/07/2015  . Gout 12/22/2014  . Visit for routine gyn exam 08/04/2014  . Visit for screening mammogram 08/04/2014  . Pain, joint, multiple sites 05/26/2014  . Vertiginous migraine 02/03/2014  . Thrombocytopenia ([place]) 10/01/2012  . Intractable migraine 08/31/2012  . Osteoarthritis 05/11/2012  . Female bladder prolapse 05/11/2012  . ADJ DISORDER WITH MIXED ANXIETY & DEPRESSED MOOD 06/30/2008  . HYPERHIDROSIS 01/25/2008  . BACK PAIN, LUMBAR, CHRONIC 10/16/2007  . REACTION, ACUTE STRESS W/EMOTIONAL DSTURB 08/01/2007  . HYPERGLYCEMIA  08/01/2007  . ELEVATED BLOOD PRESSURE WITHOUT DIAGNOSIS OF HYPERTENSION 08/01/2007  . Goiter 07/11/2007  . POLYCYSTIC OVARIAN DISEASE 05/24/2007  . HIRSUTISM 05/24/2007  . Arthropathy, multiple sites 05/24/2007   Past Medical History:  Diagnosis Date  . Arthritis   . Depression   . Headache(784.0)   . HSV-2 infection   . Immune deficiency disorder ([place])   . Stroke ([place])   . Thrombocytopenia ([place]) 10/01/2012   Past Surgical History:  Procedure Laterality Date  . ABDOMINAL HYSTERECTOMY    . CESAREAN SECTION  2008   x1  . PARTIAL HYSTERECTOMY  2012   due to fibroid  . UPPER GASTROINTESTINAL ENDOSCOPY    . [place] EXTRACTION  1995   Social History  Substance Use Topics  . Smoking status: Never Smoker  . Smokeless tobacco: Never Used     Comment: Pt states she smokes.....but "not cigarettes"  . Alcohol use 1.2 oz/week    2 Glasses of wine per week     Comment: occ   Family History  Problem Relation Age of Onset  . Diabetes Father   . Breast cancer Paternal Aunt   . Colon cancer Paternal Grandmother    Allergies  Allergen Reactions  . Trazodone And Nefazodone Nausea And Vomiting  . Amoxicillin Other (See Comments)    Yeast infection   . Carafate [Sucralfate] Nausea And Vomiting   Current Outpatient Prescriptions on File Prior to Visit  Medication Sig Dispense Refill  . ALPRAZolam (XANAX) 1 MG tablet Take 0.5 mg by mouth QID.     Marland Kitchen CLONIDINE HCL PO Take 0.5 mg by mouth daily.    Marland Kitchen dicyclomine (BENTYL) 10 MG capsule Take 1 capsule (10 mg total) by mouth daily. (Patient taking differently: Take 10 mg by mouth daily as needed. ) 60 capsule 2  . etonogestrel (IMPLANON) 68 MG IMPL implant Inject 1 each into the skin continuous. Reported on 01/27/2016    . fluticasone (FLONASE) 50 MCG/ACT nasal spray Place 2 sprays into both nostrils daily. 16 g 6  . gabapentin (NEURONTIN) 300 MG capsule Take 1 capsule (300 mg total) by mouth 3 (three) times daily. 90 capsule 3  .  MECLIZINE HCL PO Take 1 tablet by mouth at bedtime as needed (for vertigo).     . ondansetron (ZOFRAN ODT) 4 MG disintegrating tablet Take 1 tablet (4 mg total) by mouth every 8 (eight) hours as needed for nausea or vomiting. 12 tablet 0  . PARoxetine (PAXIL) 40 MG tablet Take 1 tablet (40  mg total) by mouth daily. 30 tablet 11  . promethazine (PHENERGAN) 25 MG tablet TAKE ONE TABLET BY MOUTH EVERY EIGHT HOURS AS NEEDED FOR NAUSEA OR VOMITING 30 tablet 1  . ranitidine (ZANTAC) 75 MG tablet Take 75 mg by mouth every morning.    Marland Kitchen tetrahydrozoline 0.05 % ophthalmic solution Place 1 drop into both eyes daily as needed (for dry eyes).    . valACYclovir (VALTREX) 500 MG tablet TAKE 1 TABLET BY MOUTH DAILY 30 tablet 5   No current facility-administered medications on file prior to visit.     Review of Systems Review of Systems  Constitutional: Negative for fever, appetite change, fatigue and unexpected weight change.  Eyes: Negative for pain and visual disturbance.  Respiratory: Negative for cough and shortness of breath.   Cardiovascular: Negative for cp or palpitations    Gastrointestinal: Negative for nausea, diarrhea and constipation.  Genitourinary: Negative for urgency and frequency. pos for chronic breast pain Skin: Negative for pallor or rash   MSK pos for chronic pain syndrome  Neurological: Negative for weakness, light-headedness, numbness and headaches.  Hematological: Negative for adenopathy. Does not bruise/bleed easily.  Psychiatric/Behavioral: pos for anxiety and depression- per pt controlled fairly well/sees psychiatry       Objective:   Physical Exam  Constitutional: She appears well-developed and well-nourished. No distress.  obese and well appearing   HENT:  Head: Normocephalic and atraumatic.  Right Ear: External ear normal.  Left Ear: External ear normal.  Mouth/Throat: Oropharynx is clear and moist.  Eyes: Conjunctivae and EOM are normal. Pupils are equal, round,  and reactive to light. No scleral icterus.  Neck: Normal range of motion. Neck supple. No JVD present. Carotid bruit is not present. No thyromegaly present.  Cardiovascular: Normal rate, regular rhythm, normal heart sounds and intact distal pulses.  Exam reveals no gallop.   Pulmonary/Chest: Effort normal and breath sounds normal. No respiratory distress. She has no wheezes. She exhibits no tenderness.  Abdominal: Soft. Bowel sounds are normal. She exhibits no distension, no abdominal bruit and no mass. There is no tenderness.  Genitourinary: No breast swelling, tenderness, discharge or bleeding.  Genitourinary Comments: Large pendulous breasts noted  Breast exam: No mass, nodules, thickening, bulging, retraction, inflamation, nipple discharge or skin changes noted. Pos for diffuse tenderness bilat with dense tissue  No axillary or clavicular LA.      Musculoskeletal: Normal range of motion. She exhibits no edema or tenderness.  Lymphadenopathy:    She has no cervical adenopathy.  Neurological: She is alert. She has normal reflexes. No cranial nerve deficit. She exhibits normal muscle tone. Coordination normal.  Skin: Skin is warm and dry. No rash noted. No erythema. No pallor.  Psychiatric: She has a normal mood and affect.  Mood is fairly good today          Assessment & Plan:   Problem List Items Addressed This Visit      Cardiovascular and Mediastinum   Essential hypertension - Primary    bp is improved with clonidine which she takes once daily  Gets worse with pain or distress BP: 120/85   Disc DASH eating plan and need to wt loss        Other   Thrombocytopenia (HCC)    Platelets are 128 No change in bruising  Will continue to watch  Has seen hematology in the past      Screening mammogram, encounter for    Pt has baseline fibrocystic and tender breasts  Hx of chronic breast pain Also large/pendulous and this may add to it  Scheduled annual screening mammogram Nl  breast exam today  Encouraged monthly self exams        Relevant Orders   MM DIGITAL SCREENING BILATERAL   Screening for lipoid disorders    Disc goals for lipids and reasons to control them Rev labs with pt Rev low sat fat diet in detail       HYPERGLYCEMIA    Good A1C today Lab Results  Component Value Date   HGBA1C 5.4 10/21/2016   Pt watches diet for sugar and refined carbs  Exercise is limited due to chronic pain - disc stretching and yoga      Chronic myofascial pain    Pt tends to have joint and soft tissue pain at most times  Makes it difficult to exercise Takes gabapentin  She is trying gentle yoga      ADJ DISORDER [place]    Sees psychiatry currently-on paxil which is helping Reviewed stressors/ coping techniques/symptoms/ support sources/ tx options and side effects in detail today        Other Visit Diagnoses    Need for influenza vaccination       Relevant Orders   Flu Vaccine QUAD 36+ mos IM (Completed)

## 2016-10-25 NOTE — Progress Notes (Signed)
Pre visit review using our clinic review tool, if applicable. No additional management support is needed unless otherwise documented below in the visit note. 

## 2016-10-25 NOTE — Patient Instructions (Addendum)
Flu shot today  It looks like you need a tetanus shot - medicare generally does not pay for one in the office- see the sheet I gave you regarding a pharmacy  Stop at check out for referral for mammgram  Get back to your gyn for routine follow up when you are ready   Take care of yourself  Keep eating a healthy low sugar/low carb diet   Continue some yoga if you can tolerate it

## 2016-10-26 DIAGNOSIS — G8929 Other chronic pain: Secondary | ICD-10-CM | POA: Insufficient documentation

## 2016-10-26 DIAGNOSIS — M7918 Myalgia, other site: Secondary | ICD-10-CM

## 2016-10-26 NOTE — Assessment & Plan Note (Signed)
Pt has baseline fibrocystic and tender breasts  Hx of chronic breast pain Also large/pendulous and this may add to it  Scheduled annual screening mammogram Nl breast exam today  Encouraged monthly self exams

## 2016-10-26 NOTE — Assessment & Plan Note (Signed)
Pt tends to have joint and soft tissue pain at most times  Makes it difficult to exercise Takes gabapentin  She is trying gentle yoga

## 2016-10-26 NOTE — Assessment & Plan Note (Signed)
Disc goals for lipids and reasons to control them Rev labs with pt Rev low sat fat diet in detail   

## 2016-10-26 NOTE — Assessment & Plan Note (Signed)
bp is improved with clonidine which she takes once daily  Gets worse with pain or distress BP: 120/85   Disc DASH eating plan and need to wt loss

## 2016-10-26 NOTE — Assessment & Plan Note (Signed)
Platelets are 128 No change in bruising  Will continue to watch  Has seen hematology in the past

## 2016-10-26 NOTE — Assessment & Plan Note (Signed)
Sees psychiatry currently-on paxil which is helping Reviewed stressors/ coping techniques/symptoms/ support sources/ tx options and side effects in detail today

## 2016-10-26 NOTE — Assessment & Plan Note (Signed)
Good A1C today Lab Results  Component Value Date   HGBA1C 5.4 10/21/2016   Pt watches diet for sugar and refined carbs  Exercise is limited due to chronic pain - disc stretching and yoga

## 2016-10-27 ENCOUNTER — Ambulatory Visit
Admission: RE | Admit: 2016-10-27 | Discharge: 2016-10-27 | Disposition: A | Discharge status: Home / Self Care | Payer: Medicare Other | Source: Ambulatory Visit | Attending: Family Medicine | Admitting: Family Medicine

## 2016-10-27 DIAGNOSIS — Z1231 Encounter for screening mammogram for malignant neoplasm of breast: Secondary | ICD-10-CM | POA: Diagnosis not present

## 2016-11-03 ENCOUNTER — Other Ambulatory Visit: Payer: Self-pay | Admitting: Family Medicine

## 2016-11-03 NOTE — Telephone Encounter (Signed)
Electronic refill request please advise  

## 2016-11-03 NOTE — Telephone Encounter (Signed)
Please  Verify if this is for a yeast infection and if so refill times one  Update if no improvement

## 2016-11-04 NOTE — Telephone Encounter (Signed)
Pt said she doesn't have a yeast inf. And she didn't request med, Rx declined and pt will check with [facility] to see why they sent the request

## 2016-11-08 ENCOUNTER — Encounter: Payer: Self-pay | Admitting: Family Medicine

## 2016-11-15 ENCOUNTER — Encounter: Payer: Self-pay | Admitting: Family Medicine

## 2016-11-15 MED ORDER — ONDANSETRON 4 MG PO TBDP: 4.0000 mg | ORAL_TABLET | Freq: Three times a day (TID) | ORAL | 1 refills | Status: DC | PRN

## 2016-11-15 MED ORDER — VALACYCLOVIR HCL 500 MG PO TABS: 500.0000 mg | ORAL_TABLET | Freq: Every day | ORAL | 5 refills | Status: DC

## 2016-11-21 ENCOUNTER — Inpatient Hospital Stay (HOSPITAL_COMMUNITY)
Admission: EM | Admit: 2016-11-21 | Discharge: 2016-11-23 | DRG: 392 | Disposition: A | Discharge status: Home / Self Care | Payer: Medicare Other | Attending: Internal Medicine | Admitting: Internal Medicine

## 2016-11-21 ENCOUNTER — Encounter (HOSPITAL_COMMUNITY): Payer: Self-pay

## 2016-11-21 DIAGNOSIS — R112 Nausea with vomiting, unspecified: Secondary | ICD-10-CM | POA: Diagnosis not present

## 2016-11-21 DIAGNOSIS — Z888 Allergy status to other drugs, medicaments and biological substances status: Secondary | ICD-10-CM

## 2016-11-21 DIAGNOSIS — N179 Acute kidney failure, unspecified: Secondary | ICD-10-CM | POA: Diagnosis present

## 2016-11-21 DIAGNOSIS — F419 Anxiety disorder, unspecified: Secondary | ICD-10-CM | POA: Diagnosis present

## 2016-11-21 DIAGNOSIS — Z800 Family history of malignant neoplasm of digestive organs: Secondary | ICD-10-CM

## 2016-11-21 DIAGNOSIS — N390 Urinary tract infection, site not specified: Secondary | ICD-10-CM | POA: Diagnosis present

## 2016-11-21 DIAGNOSIS — R3129 Other microscopic hematuria: Secondary | ICD-10-CM | POA: Diagnosis present

## 2016-11-21 DIAGNOSIS — F329 Major depressive disorder, single episode, unspecified: Secondary | ICD-10-CM | POA: Diagnosis present

## 2016-11-21 DIAGNOSIS — K2970 Gastritis, unspecified, without bleeding: Secondary | ICD-10-CM | POA: Diagnosis not present

## 2016-11-21 DIAGNOSIS — E876 Hypokalemia: Secondary | ICD-10-CM | POA: Diagnosis present

## 2016-11-21 DIAGNOSIS — Z8673 Personal history of transient ischemic attack (TIA), and cerebral infarction without residual deficits: Secondary | ICD-10-CM | POA: Diagnosis not present

## 2016-11-21 DIAGNOSIS — D849 Immunodeficiency, unspecified: Secondary | ICD-10-CM | POA: Diagnosis present

## 2016-11-21 DIAGNOSIS — A6000 Herpesviral infection of urogenital system, unspecified: Secondary | ICD-10-CM | POA: Diagnosis present

## 2016-11-21 DIAGNOSIS — I129 Hypertensive chronic kidney disease with stage 1 through stage 4 chronic kidney disease, or unspecified chronic kidney disease: Secondary | ICD-10-CM | POA: Diagnosis not present

## 2016-11-21 DIAGNOSIS — Z881 Allergy status to other antibiotic agents status: Secondary | ICD-10-CM

## 2016-11-21 DIAGNOSIS — K219 Gastro-esophageal reflux disease without esophagitis: Secondary | ICD-10-CM | POA: Diagnosis present

## 2016-11-21 DIAGNOSIS — A084 Viral intestinal infection, unspecified: Secondary | ICD-10-CM | POA: Diagnosis not present

## 2016-11-21 DIAGNOSIS — N182 Chronic kidney disease, stage 2 (mild): Secondary | ICD-10-CM | POA: Diagnosis present

## 2016-11-21 DIAGNOSIS — B009 Herpesviral infection, unspecified: Secondary | ICD-10-CM | POA: Diagnosis present

## 2016-11-21 DIAGNOSIS — E282 Polycystic ovarian syndrome: Secondary | ICD-10-CM | POA: Diagnosis present

## 2016-11-21 DIAGNOSIS — Z7951 Long term (current) use of inhaled steroids: Secondary | ICD-10-CM

## 2016-11-21 DIAGNOSIS — Z803 Family history of malignant neoplasm of breast: Secondary | ICD-10-CM

## 2016-11-21 DIAGNOSIS — N189 Chronic kidney disease, unspecified: Secondary | ICD-10-CM

## 2016-11-21 DIAGNOSIS — I10 Essential (primary) hypertension: Secondary | ICD-10-CM | POA: Diagnosis present

## 2016-11-21 DIAGNOSIS — Z90710 Acquired absence of both cervix and uterus: Secondary | ICD-10-CM

## 2016-11-21 DIAGNOSIS — Z833 Family history of diabetes mellitus: Secondary | ICD-10-CM

## 2016-11-21 DIAGNOSIS — E860 Dehydration: Secondary | ICD-10-CM | POA: Diagnosis present

## 2016-11-21 LAB — COMPREHENSIVE METABOLIC PANEL
ALK PHOS: 51 U/L (ref 38–126)
ALT: 27 U/L (ref 14–54)
ANION GAP: 11 (ref 5–15)
AST: 36 U/L (ref 15–41)
Albumin: 5 g/dL (ref 3.5–5.0)
BUN: 31 mg/dL — ABNORMAL HIGH (ref 6–20)
CALCIUM: 9.5 mg/dL (ref 8.9–10.3)
CO2: 24 mmol/L (ref 22–32)
Chloride: 103 mmol/L (ref 101–111)
Creatinine, Ser: 3.05 mg/dL — ABNORMAL HIGH (ref 0.44–1.00)
GFR, EST AFRICAN AMERICAN: 20 mL/min — AB (ref >60)
GFR, EST NON AFRICAN AMERICAN: 17 mL/min — AB (ref >60)
Glucose, Bld: 121 mg/dL — ABNORMAL HIGH (ref 65–99)
Potassium: 3.9 mmol/L (ref 3.5–5.1)
SODIUM: 138 mmol/L (ref 135–145)
TOTAL PROTEIN: 8.8 g/dL — AB (ref 6.5–8.1)
Total Bilirubin: 1.3 mg/dL — ABNORMAL HIGH (ref 0.3–1.2)

## 2016-11-21 LAB — LIPASE, BLOOD: Lipase: 48 U/L (ref 11–51)

## 2016-11-21 LAB — CBC
HCT: 45.9 % (ref 36.0–46.0)
HEMOGLOBIN: 16.5 g/dL — AB (ref 12.0–15.0)
MCH: 33.7 pg (ref 26.0–34.0)
MCHC: 35.9 g/dL (ref 30.0–36.0)
MCV: 93.7 fL (ref 78.0–100.0)
PLATELETS: 174 10*3/uL (ref 150–400)
RBC: 4.9 MIL/uL (ref 3.87–5.11)
RDW: 13.6 % (ref 11.5–15.5)
WBC: 9.6 10*3/uL (ref 4.0–10.5)

## 2016-11-21 MED ORDER — ONDANSETRON HCL 4 MG/2ML IJ SOLN
4.0000 mg | Freq: Once | INTRAMUSCULAR | Status: AC | PRN
  Administered 2016-11-21: 4 mg via INTRAVENOUS
  Filled 2016-11-21: qty 2

## 2016-11-21 MED ORDER — FAMOTIDINE IN NACL 20-0.9 MG/50ML-% IV SOLN
20.0000 mg | Freq: Once | INTRAVENOUS | Status: AC
  Administered 2016-11-21: 20 mg via INTRAVENOUS
  Filled 2016-11-21: qty 50

## 2016-11-21 MED ORDER — SODIUM CHLORIDE 0.9 % IV BOLUS (SEPSIS)
1000.0000 mL | Freq: Once | INTRAVENOUS | Status: AC
  Administered 2016-11-21: 1000 mL via INTRAVENOUS

## 2016-11-21 NOTE — ED Notes (Signed)
Bed: [id number] Expected date:  Expected time:  Means of arrival:  Comments: 46 yo F/ [place]

## 2016-11-21 NOTE — ED Triage Notes (Signed)
Pt complains of abdominal pain, nausea vomiting and diarrhea for two days

## 2016-11-21 NOTE — ED Provider Notes (Signed)
[facility] DEPT Provider Note   CSN: [id number] Arrival date & time: 11/21/16  2119 By signing my name below, I, [name], attest that this documentation has been prepared under the direction and in the presence of non-physician practitioner, Janetta Hora, PA-C. Electronically Signed: [name], Scribe. 11/22/2016. 12:02 AM.   History   Chief Complaint Chief Complaint  Patient presents with  . Emesis   HPI Jennfier D Madaline Greer is a 46 y.o. female with a hx of HTN, stroke, PCOS, and recurrent N/V who presents to the Emergency Department complaining of frequent vomiting onset two days ago. She is unable to quantify how many times she's vomited today. Pt's mother states she has been unable to keep anything down and has been dry heaving. Pt reports associated generalized weakness, diaphoresis, nausea, severe abdominal pain, and hematemesis. The abdominal pain is in the periumbilical area and worse with vomiting. She describes bloody streaks in her vomit tonight. No alleviating or modifying factors noted. No attempted treatments indicated. This is a recurrent problem, but pt has never been admitted for this before. She has urinated today. She reports abdominal SHx of cesarean section and hysterectomy. Pt denies any fever, diarrhea, constipation, dysuria, hematuria. Of note she has had work up by GI for recurrent N/V but has not received any specific diagnosis. Had EGD in 2016 which showed mild gastritis. She takes Clonidine daily and states she took it last night but unsure if she was able to keep it down. Has hx of THC use.  The history is provided by the patient. No language interpreter was used.   Past Medical History:  Diagnosis Date  . Arthritis   . Depression   . Headache(784.0)   . HSV-2 infection   . Immune deficiency disorder ([place])   . Stroke ([place])   . Thrombocytopenia ([place]) 10/01/2012    Patient Active Problem List   Diagnosis Date Noted  . Chronic myofascial pain  10/26/2016  . Screening mammogram, encounter for 10/25/2016  . Screening for lipoid disorders 10/20/2016  . HSV-2 infection 08/17/2016  . Renal insufficiency 03/08/2016  . Nausea with vomiting 03/08/2016  . Dehydration 03/08/2016  . Right knee pain 01/27/2016  . Adverse effects of medication 11/25/2015  . Vertigo 10/08/2015  . Pain of right thumb 03/16/2015  . Carpal tunnel syndrome 03/16/2015  . Left hip pain 02/17/2015  . Shoulder pain, left 02/17/2015  . Knee pain, bilateral 01/07/2015  . Gout 12/22/2014  . Visit for routine gyn exam 08/04/2014  . Visit for screening mammogram 08/04/2014  . Pain, joint, multiple sites 05/26/2014  . Vertiginous migraine 02/03/2014  . Thrombocytopenia ([place]) 10/01/2012  . Intractable migraine 08/31/2012  . Osteoarthritis 05/11/2012  . Female bladder prolapse 05/11/2012  . ADJ DISORDER WITH MIXED ANXIETY & DEPRESSED MOOD 06/30/2008  . HYPERHIDROSIS 01/25/2008  . BACK PAIN, LUMBAR, CHRONIC 10/16/2007  . REACTION, ACUTE STRESS W/EMOTIONAL DSTURB 08/01/2007  . HYPERGLYCEMIA 08/01/2007  . Essential hypertension 08/01/2007  . Goiter 07/11/2007  . POLYCYSTIC OVARIAN DISEASE 05/24/2007  . HIRSUTISM 05/24/2007  . Arthropathy, multiple sites 05/24/2007    Past Surgical History:  Procedure Laterality Date  . ABDOMINAL HYSTERECTOMY    . CESAREAN SECTION  2008   x1  . PARTIAL HYSTERECTOMY  2012   due to fibroid  . UPPER GASTROINTESTINAL ENDOSCOPY    . WISDOM TOOTH EXTRACTION  1995    OB History    Gravida Para Term Preterm AB Living   3  3   SAB TAB Ectopic Multiple Live Births                  Obstetric Comments   Pt states that she has had a lot of miscarriages. Do not know how many      Home Medications    Prior to Admission medications   Medication Sig Start Date End Date Taking? Authorizing Provider  ALPRAZolam Duanne Moron) 1 MG tablet Take 0.5 mg by mouth QID.    Yes Historical Provider, MD  CLONIDINE HCL PO Take 0.5 mg by  mouth daily.   Yes Historical Provider, MD  dicyclomine (BENTYL) 10 MG capsule Take 1 capsule (10 mg total) by mouth daily. Patient taking differently: Take 10 mg by mouth daily as needed.  10/05/15  Yes Gatha Mayer, MD  etonogestrel (IMPLANON) 68 MG IMPL implant Inject 1 each into the skin continuous. Reported on 01/27/2016   Yes Historical Provider, MD  fluticasone (FLONASE) 50 MCG/ACT nasal spray Place 2 sprays into both nostrils daily. 04/07/14  Yes Abner Greenspan, MD  gabapentin (NEURONTIN) 300 MG capsule Take 1 capsule (300 mg total) by mouth 3 (three) times daily. 11/25/15  Yes Abner Greenspan, MD  MECLIZINE HCL PO Take 1 tablet by mouth at bedtime as needed (for vertigo).    Yes Historical Provider, MD  ondansetron (ZOFRAN ODT) 4 MG disintegrating tablet Take 1 tablet (4 mg total) by mouth every 8 (eight) hours as needed for nausea or vomiting. 11/15/16  Yes Abner Greenspan, MD  PARoxetine (PAXIL) 40 MG tablet Take 1 tablet (40 mg total) by mouth daily. 05/26/14  Yes Abner Greenspan, MD  promethazine (PHENERGAN) 25 MG tablet TAKE ONE TABLET BY MOUTH EVERY EIGHT HOURS AS NEEDED FOR NAUSEA OR VOMITING 07/06/16  Yes Abner Greenspan, MD  ranitidine (ZANTAC) 75 MG tablet Take 75 mg by mouth every morning.   Yes Historical Provider, MD  tetrahydrozoline 0.05 % ophthalmic solution Place 1 drop into both eyes daily as needed (for dry eyes).   Yes Historical Provider, MD  valACYclovir (VALTREX) 500 MG tablet Take 1 tablet (500 mg total) by mouth daily. 11/15/16  Yes Abner Greenspan, MD    Family History Family History  Problem Relation Age of Onset  . Diabetes Father   . Breast cancer Paternal Aunt   . Colon cancer Paternal Grandmother     Social History Social History  Substance Use Topics  . Smoking status: Never Smoker  . Smokeless tobacco: Never Used     Comment: Pt states she smokes.....but "not cigarettes"  . Alcohol use 1.2 oz/week    2 Glasses of wine per week     Comment: occ    Allergies     Trazodone and nefazodone; Amoxicillin; and Carafate [sucralfate]  Review of Systems Review of Systems  Constitutional: Positive for appetite change, diaphoresis and fatigue. Negative for chills and fever.  Respiratory: Negative for shortness of breath.   Cardiovascular: Negative for chest pain.  Gastrointestinal: Positive for abdominal pain, nausea and vomiting. Negative for constipation and diarrhea.  Genitourinary: Negative for dysuria and hematuria.  Neurological: Positive for weakness.  All other systems reviewed and are negative.  Physical Exam Updated Vital Signs BP (!) 185/116 (BP Location: Left Arm)   Pulse 75   Temp 98 F (36.7 C)   Resp 18   SpO2 100%   Physical Exam  Constitutional: She is oriented to person, place, and time. She appears well-developed and well-nourished.  She appears distressed (uncomfortable).  HENT:  Head: Normocephalic and atraumatic.  Eyes: Conjunctivae are normal. Pupils are equal, round, and reactive to light. Right eye exhibits no discharge. Left eye exhibits no discharge. No scleral icterus.  Neck: Normal range of motion.  Cardiovascular: Normal rate and regular rhythm.  Exam reveals no gallop and no friction rub.   No murmur heard. Pulmonary/Chest: Effort normal and breath sounds normal. No respiratory distress. She has no wheezes. She has no rales. She exhibits no tenderness.  Abdominal: Soft. Bowel sounds are normal. She exhibits no distension and no mass. There is tenderness. There is no rebound and no guarding. No hernia.  Generalized tenderness  Neurological: She is alert and oriented to person, place, and time.  Skin: Skin is warm and dry.  Psychiatric: She has a normal mood and affect. Her behavior is normal.  Nursing note and vitals reviewed.   ED Treatments / Results  DIAGNOSTIC STUDIES:  Oxygen Saturation is 100% on RA, normal by my interpretation.    COORDINATION OF CARE:  12:01 AM Discussed treatment plan with pt at  bedside and pt agreed to plan.   Labs (all labs ordered are listed, but only abnormal results are displayed) Labs Reviewed  COMPREHENSIVE METABOLIC PANEL - Abnormal; Notable for the following:       Result Value   Glucose, Bld 121 (*)    BUN 31 (*)    Creatinine, Ser 3.05 (*)    Total Protein 8.8 (*)    Total Bilirubin 1.3 (*)    GFR calc non Af Amer 17 (*)    GFR calc Af Amer 20 (*)    All other components within normal limits  CBC - Abnormal; Notable for the following:    Hemoglobin 16.5 (*)    All other components within normal limits  URINALYSIS, ROUTINE W REFLEX MICROSCOPIC - Abnormal; Notable for the following:    APPearance HAZY (*)    Glucose, UA 50 (*)    Hgb urine dipstick LARGE (*)    Ketones, ur 5 (*)    Leukocytes, UA LARGE (*)    Bacteria, UA RARE (*)    Squamous Epithelial / LPF 0-5 (*)    All other components within normal limits  RAPID URINE DRUG SCREEN, HOSP PERFORMED - Abnormal; Notable for the following:    Benzodiazepines POSITIVE (*)    Tetrahydrocannabinol POSITIVE (*)    All other components within normal limits  BASIC METABOLIC PANEL - Abnormal; Notable for the following:    Potassium 3.4 (*)    Glucose, Bld 132 (*)    BUN 25 (*)    Creatinine, Ser 2.12 (*)    GFR calc non Af Amer 27 (*)    GFR calc Af Amer 31 (*)    All other components within normal limits  CBC - Abnormal; Notable for the following:    WBC 12.7 (*)    Hemoglobin 16.0 (*)    All other components within normal limits  URINE CULTURE  LIPASE, BLOOD  CREATININE, URINE, RANDOM  SODIUM, URINE, RANDOM    EKG  EKG Interpretation None       Radiology No results found.  Procedures Procedures (including critical care time)  Medications Ordered in ED Medications  famotidine (PEPCID) IVPB 20 mg premix (20 mg Intravenous Given 11/22/16 0944)  valACYclovir (VALTREX) tablet 500 mg (500 mg Oral Given 11/22/16 0942)  cloNIDine (CATAPRES) tablet 0.1 mg (0.1 mg Oral Given 11/22/16  1537)  gabapentin (NEURONTIN) capsule 300  mg (300 mg Oral Given 11/22/16 1536)  dicyclomine (BENTYL) capsule 10 mg (10 mg Oral Given 11/22/16 0324)  meclizine (ANTIVERT) tablet 12.5 mg (not administered)  ALPRAZolam (XANAX) tablet 0.5 mg (0.5 mg Oral Given 11/22/16 1537)  PARoxetine (PAXIL) tablet 40 mg (40 mg Oral Given 11/22/16 0942)  fluticasone (FLONASE) 50 MCG/ACT nasal spray 2 spray (2 sprays Each Nare Not Given 11/22/16 0945)  naphazoline-glycerin (CLEAR EYES) ophth solution 1 drop (not administered)  hydrALAZINE (APRESOLINE) injection 5 mg (not administered)  metoCLOPramide (REGLAN) injection 5 mg (5 mg Intravenous Given 11/22/16 1311)  0.9 %  sodium chloride infusion (75 mL/hr Intravenous Rate/Dose Change 11/22/16 1324)  enoxaparin (LOVENOX) injection 40 mg (40 mg Subcutaneous Given 11/22/16 0943)  acetaminophen (TYLENOL) tablet 650 mg (not administered)    Or  acetaminophen (TYLENOL) suppository 650 mg (not administered)  zolpidem (AMBIEN) tablet 5 mg (not administered)  cefTRIAXone (ROCEPHIN) 1 g in dextrose 5 % 50 mL IVPB (1 g Intravenous Given 11/22/16 0406)  ondansetron (ZOFRAN) injection 4 mg (4 mg Intravenous Given 11/21/16 2141)  sodium chloride 0.9 % bolus 1,000 mL (0 mLs Intravenous Stopped 11/22/16 0052)  famotidine (PEPCID) IVPB 20 mg premix (0 mg Intravenous Stopped 11/22/16 0052)  sodium chloride 0.9 % bolus 1,000 mL (0 mLs Intravenous Stopped 11/22/16 0321)  ondansetron (ZOFRAN) injection 4 mg (4 mg Intravenous Given 11/22/16 0324)     Initial Impression / Assessment and Plan / ED Course  I have reviewed the triage vital signs and the nursing notes.  Pertinent labs & imaging results that were available during my care of the patient were reviewed by me and considered in my medical decision making (see chart for details).  46 year old female presents with AKI due to N/V. She is hypertensive but otherwise vitals are normal. CBC unremarkable. CMP remarkable for SCr of 3.05 which  has tripled since she was last had labs drawn a month ago. Otherwise CMP is unremarkable. UA has large hgb and large leuks. No dysuria. Abdomen is soft, benign - no imaging indicated. Patient given 2L IVF and Pepcid. Will admit for obs.  Final Clinical Impressions(s) / ED Diagnoses   Final diagnoses:  AKI (acute kidney injury) ([place])  Nausea and vomiting, intractability of vomiting not specified, unspecified vomiting type    New Prescriptions New Prescriptions   No medications on file   I personally performed the services described in this documentation, which was scribed in my presence. The recorded information has been reviewed and is accurate.    Recardo Evangelist, PA-C 11/22/16 2035    Daleen Bo, MD 11/23/16 [phone]

## 2016-11-22 ENCOUNTER — Telehealth: Payer: Self-pay

## 2016-11-22 DIAGNOSIS — Z7951 Long term (current) use of inhaled steroids: Secondary | ICD-10-CM | POA: Diagnosis not present

## 2016-11-22 DIAGNOSIS — E282 Polycystic ovarian syndrome: Secondary | ICD-10-CM | POA: Diagnosis present

## 2016-11-22 DIAGNOSIS — E860 Dehydration: Secondary | ICD-10-CM | POA: Diagnosis present

## 2016-11-22 DIAGNOSIS — R3129 Other microscopic hematuria: Secondary | ICD-10-CM | POA: Diagnosis present

## 2016-11-22 DIAGNOSIS — N390 Urinary tract infection, site not specified: Secondary | ICD-10-CM | POA: Diagnosis present

## 2016-11-22 DIAGNOSIS — I10 Essential (primary) hypertension: Secondary | ICD-10-CM | POA: Diagnosis not present

## 2016-11-22 DIAGNOSIS — Z800 Family history of malignant neoplasm of digestive organs: Secondary | ICD-10-CM | POA: Diagnosis not present

## 2016-11-22 DIAGNOSIS — F419 Anxiety disorder, unspecified: Secondary | ICD-10-CM | POA: Diagnosis not present

## 2016-11-22 DIAGNOSIS — Z833 Family history of diabetes mellitus: Secondary | ICD-10-CM | POA: Diagnosis not present

## 2016-11-22 DIAGNOSIS — R1010 Upper abdominal pain, unspecified: Secondary | ICD-10-CM | POA: Insufficient documentation

## 2016-11-22 DIAGNOSIS — R109 Unspecified abdominal pain: Secondary | ICD-10-CM | POA: Insufficient documentation

## 2016-11-22 DIAGNOSIS — N182 Chronic kidney disease, stage 2 (mild): Secondary | ICD-10-CM | POA: Diagnosis present

## 2016-11-22 DIAGNOSIS — N179 Acute kidney failure, unspecified: Secondary | ICD-10-CM | POA: Insufficient documentation

## 2016-11-22 DIAGNOSIS — Z8673 Personal history of transient ischemic attack (TIA), and cerebral infarction without residual deficits: Secondary | ICD-10-CM | POA: Diagnosis not present

## 2016-11-22 DIAGNOSIS — A084 Viral intestinal infection, unspecified: Secondary | ICD-10-CM | POA: Diagnosis present

## 2016-11-22 DIAGNOSIS — Z90710 Acquired absence of both cervix and uterus: Secondary | ICD-10-CM | POA: Diagnosis not present

## 2016-11-22 DIAGNOSIS — K219 Gastro-esophageal reflux disease without esophagitis: Secondary | ICD-10-CM | POA: Diagnosis present

## 2016-11-22 DIAGNOSIS — R112 Nausea with vomiting, unspecified: Secondary | ICD-10-CM | POA: Diagnosis not present

## 2016-11-22 DIAGNOSIS — Z888 Allergy status to other drugs, medicaments and biological substances status: Secondary | ICD-10-CM | POA: Diagnosis not present

## 2016-11-22 DIAGNOSIS — D849 Immunodeficiency, unspecified: Secondary | ICD-10-CM | POA: Diagnosis present

## 2016-11-22 DIAGNOSIS — I129 Hypertensive chronic kidney disease with stage 1 through stage 4 chronic kidney disease, or unspecified chronic kidney disease: Secondary | ICD-10-CM | POA: Diagnosis present

## 2016-11-22 DIAGNOSIS — N3000 Acute cystitis without hematuria: Secondary | ICD-10-CM

## 2016-11-22 DIAGNOSIS — F329 Major depressive disorder, single episode, unspecified: Secondary | ICD-10-CM | POA: Diagnosis present

## 2016-11-22 DIAGNOSIS — N189 Chronic kidney disease, unspecified: Secondary | ICD-10-CM

## 2016-11-22 DIAGNOSIS — E876 Hypokalemia: Secondary | ICD-10-CM | POA: Diagnosis present

## 2016-11-22 DIAGNOSIS — Z881 Allergy status to other antibiotic agents status: Secondary | ICD-10-CM | POA: Diagnosis not present

## 2016-11-22 DIAGNOSIS — Z803 Family history of malignant neoplasm of breast: Secondary | ICD-10-CM | POA: Diagnosis not present

## 2016-11-22 DIAGNOSIS — A6000 Herpesviral infection of urogenital system, unspecified: Secondary | ICD-10-CM | POA: Diagnosis present

## 2016-11-22 DIAGNOSIS — B009 Herpesviral infection, unspecified: Secondary | ICD-10-CM | POA: Diagnosis not present

## 2016-11-22 LAB — CBC
HEMATOCRIT: 44.8 % (ref 36.0–46.0)
HEMOGLOBIN: 16 g/dL — AB (ref 12.0–15.0)
MCH: 32.7 pg (ref 26.0–34.0)
MCHC: 35.7 g/dL (ref 30.0–36.0)
MCV: 91.6 fL (ref 78.0–100.0)
Platelets: 158 10*3/uL (ref 150–400)
RBC: 4.89 MIL/uL (ref 3.87–5.11)
RDW: 13.5 % (ref 11.5–15.5)
WBC: 12.7 10*3/uL — ABNORMAL HIGH (ref 4.0–10.5)

## 2016-11-22 LAB — URINALYSIS, ROUTINE W REFLEX MICROSCOPIC
BILIRUBIN URINE: NEGATIVE
Glucose, UA: 50 mg/dL — AB
KETONES UR: 5 mg/dL — AB
Nitrite: NEGATIVE
Protein, ur: NEGATIVE mg/dL
Specific Gravity, Urine: 1.015 (ref 1.005–1.030)
pH: 5 (ref 5.0–8.0)

## 2016-11-22 LAB — BASIC METABOLIC PANEL
Anion gap: 10 (ref 5–15)
BUN: 25 mg/dL — ABNORMAL HIGH (ref 6–20)
CALCIUM: 9.2 mg/dL (ref 8.9–10.3)
CHLORIDE: 105 mmol/L (ref 101–111)
CO2: 23 mmol/L (ref 22–32)
Creatinine, Ser: 2.12 mg/dL — ABNORMAL HIGH (ref 0.44–1.00)
GFR calc non Af Amer: 27 mL/min — ABNORMAL LOW (ref >60)
GFR, EST AFRICAN AMERICAN: 31 mL/min — AB (ref >60)
Glucose, Bld: 132 mg/dL — ABNORMAL HIGH (ref 65–99)
POTASSIUM: 3.4 mmol/L — AB (ref 3.5–5.1)
Sodium: 138 mmol/L (ref 135–145)

## 2016-11-22 LAB — RAPID URINE DRUG SCREEN, HOSP PERFORMED
Amphetamines: NOT DETECTED
Barbiturates: NOT DETECTED
Benzodiazepines: POSITIVE — AB
COCAINE: NOT DETECTED
Opiates: NOT DETECTED
Tetrahydrocannabinol: POSITIVE — AB

## 2016-11-22 LAB — CREATININE, URINE, RANDOM: Creatinine, Urine: 172.64 mg/dL

## 2016-11-22 LAB — SODIUM, URINE, RANDOM: SODIUM UR: 109 mmol/L

## 2016-11-22 MED ORDER — ACETAMINOPHEN 650 MG RE SUPP
650.0000 mg | Freq: Four times a day (QID) | RECTAL | Status: DC | PRN
Start: 2016-11-22 — End: 2016-11-23

## 2016-11-22 MED ORDER — NAPHAZOLINE-GLYCERIN 0.012-0.2 % OP SOLN
1.0000 [drp] | Freq: Four times a day (QID) | OPHTHALMIC | Status: DC | PRN
  Filled 2016-11-22: qty 15

## 2016-11-22 MED ORDER — FAMOTIDINE IN NACL 20-0.9 MG/50ML-% IV SOLN
20.0000 mg | Freq: Two times a day (BID) | INTRAVENOUS | Status: DC
  Administered 2016-11-22 – 2016-11-23 (×3): 20 mg via INTRAVENOUS
  Filled 2016-11-22 (×3): qty 50

## 2016-11-22 MED ORDER — ALPRAZOLAM 0.5 MG PO TABS
0.5000 mg | ORAL_TABLET | Freq: Four times a day (QID) | ORAL | Status: DC
  Administered 2016-11-22 – 2016-11-23 (×6): 0.5 mg via ORAL
  Filled 2016-11-22 (×6): qty 1

## 2016-11-22 MED ORDER — HYDRALAZINE HCL 20 MG/ML IJ SOLN: 5.0000 mg | INTRAMUSCULAR | Status: DC | PRN

## 2016-11-22 MED ORDER — VALACYCLOVIR HCL 500 MG PO TABS
500.0000 mg | ORAL_TABLET | Freq: Every day | ORAL | Status: DC
  Administered 2016-11-22 – 2016-11-23 (×2): 500 mg via ORAL
  Filled 2016-11-22 (×2): qty 1

## 2016-11-22 MED ORDER — GABAPENTIN 300 MG PO CAPS
300.0000 mg | ORAL_CAPSULE | Freq: Three times a day (TID) | ORAL | Status: DC
  Administered 2016-11-22 – 2016-11-23 (×4): 300 mg via ORAL
  Filled 2016-11-22 (×4): qty 1

## 2016-11-22 MED ORDER — METOCLOPRAMIDE HCL 5 MG/ML IJ SOLN
5.0000 mg | Freq: Three times a day (TID) | INTRAMUSCULAR | Status: DC
  Administered 2016-11-22 – 2016-11-23 (×5): 5 mg via INTRAVENOUS
  Filled 2016-11-22 (×5): qty 2

## 2016-11-22 MED ORDER — PAROXETINE HCL 20 MG PO TABS
40.0000 mg | ORAL_TABLET | Freq: Every day | ORAL | Status: DC
  Administered 2016-11-22 – 2016-11-23 (×2): 40 mg via ORAL
  Filled 2016-11-22 (×2): qty 2

## 2016-11-22 MED ORDER — FLUTICASONE PROPIONATE 50 MCG/ACT NA SUSP
2.0000 | Freq: Every day | NASAL | Status: DC
  Filled 2016-11-22: qty 16

## 2016-11-22 MED ORDER — SODIUM CHLORIDE 0.9 % IV SOLN
INTRAVENOUS | Status: DC
  Administered 2016-11-22: 03:00:00 via INTRAVENOUS
  Administered 2016-11-22: 125 mL/h via INTRAVENOUS
  Administered 2016-11-23: 02:00:00 via INTRAVENOUS

## 2016-11-22 MED ORDER — CLONIDINE HCL 0.1 MG PO TABS
0.1000 mg | ORAL_TABLET | Freq: Three times a day (TID) | ORAL | Status: DC
  Administered 2016-11-22 – 2016-11-23 (×3): 0.1 mg via ORAL
  Filled 2016-11-22 (×4): qty 1

## 2016-11-22 MED ORDER — ENOXAPARIN SODIUM 40 MG/0.4ML ~~LOC~~ SOLN
40.0000 mg | SUBCUTANEOUS | Status: DC
  Administered 2016-11-22 – 2016-11-23 (×2): 40 mg via SUBCUTANEOUS
  Filled 2016-11-22 (×2): qty 0.4

## 2016-11-22 MED ORDER — ONDANSETRON HCL 4 MG/2ML IJ SOLN
4.0000 mg | Freq: Three times a day (TID) | INTRAMUSCULAR | Status: AC | PRN
  Administered 2016-11-22: 4 mg via INTRAVENOUS
  Filled 2016-11-22: qty 2

## 2016-11-22 MED ORDER — SODIUM CHLORIDE 0.9 % IV BOLUS (SEPSIS)
1000.0000 mL | Freq: Once | INTRAVENOUS | Status: AC
  Administered 2016-11-22: 1000 mL via INTRAVENOUS

## 2016-11-22 MED ORDER — MECLIZINE HCL 12.5 MG PO TABS
12.5000 mg | ORAL_TABLET | Freq: Three times a day (TID) | ORAL | Status: DC | PRN
  Filled 2016-11-22: qty 1

## 2016-11-22 MED ORDER — ZOLPIDEM TARTRATE 5 MG PO TABS: 5.0000 mg | ORAL_TABLET | Freq: Every evening | ORAL | Status: DC | PRN

## 2016-11-22 MED ORDER — DEXTROSE 5 % IV SOLN
1.0000 g | Freq: Every day | INTRAVENOUS | Status: DC
  Administered 2016-11-22 – 2016-11-23 (×2): 1 g via INTRAVENOUS
  Filled 2016-11-22 (×3): qty 10

## 2016-11-22 MED ORDER — ACETAMINOPHEN 325 MG PO TABS: 650.0000 mg | ORAL_TABLET | Freq: Four times a day (QID) | ORAL | Status: DC | PRN

## 2016-11-22 MED ORDER — DICYCLOMINE HCL 10 MG PO CAPS
10.0000 mg | ORAL_CAPSULE | Freq: Every day | ORAL | Status: DC | PRN
  Administered 2016-11-22: 10 mg via ORAL
  Filled 2016-11-22 (×2): qty 1

## 2016-11-22 NOTE — ED Notes (Signed)
Pt. Made aware for the need of urine. 

## 2016-11-22 NOTE — H&P (Signed)
History and Physical    Dana Greer [id number] DOB: 09/16/1971 DOA: 11/21/2016  Referring MD/NP/PA:   PCP: Loura Pardon, MD   Patient coming from:  The patient is coming from home.  At baseline, pt is independent for most of ADL.     Chief Complaint: nausea, vomiting  HPI: Dana Greer is a 46 y.o. female with medical history significant of hypertension, GERD, depression, anxiety, stroke, immunodeficiency disorder, HSV-2, CKD-II, who presents with nausea, vomiting.  Patient states that he has nausea, vomiting in the past 2 days which has been progressively getting worse. She has vomited more than 5 times today. She denies diarrhea. She states that she has abdominal cramping pain when she has severe vomiting, but no abdominal pain at rest. Patient denies dysuria, burning on urination or increased urinary frequency. No hematuria. No hematochezia, hematemesis or hematuria. Patient denies chest pain, shortness rest, cough, runny nose or sore throat. No unilateral weakness. Of note, patient has similar issue before, she had EGD which showed mild gastritis 2016.  ED Course: pt was found to have WBC 9.6, lipase of 48, positive urinalysis with large amount of leukocytes, total bilirubin 1.3 with normal transaminases, worsening renal function, temperature 99.9, oxygen 100% on room air. Blood pressure elevated at 197/116, patient is placed on MedSurg bed for observation.  Review of Systems:   General: no fevers, chills, no changes in body weight, has poor appetite, has fatigue HEENT: no blurry vision, hearing changes or sore throat Respiratory: no dyspnea, coughing, wheezing CV: no chest pain, no palpitations GI: has nausea, vomiting, abdominal pain, no diarrhea, constipation GU: no dysuria, burning on urination, increased urinary frequency, hematuria  Ext: no leg edema Neuro: no unilateral weakness, numbness, or tingling, no vision change or hearing loss Skin: no rash, no skin  tear. MSK: No muscle spasm, no deformity, no limitation of range of movement in spin Heme: No easy bruising.  Travel history: No recent long distant travel.  Allergy:  Allergies  Allergen Reactions  . Trazodone And Nefazodone Nausea And Vomiting  . Amoxicillin Other (See Comments)    Yeast infection   . Carafate [Sucralfate] Nausea And Vomiting    Past Medical History:  Diagnosis Date  . Arthritis   . Depression   . Headache(784.0)   . HSV-2 infection   . Immune deficiency disorder ([place])   . Stroke ([place])   . Thrombocytopenia ([place]) 10/01/2012    Past Surgical History:  Procedure Laterality Date  . ABDOMINAL HYSTERECTOMY    . CESAREAN SECTION  2008   x1  . PARTIAL HYSTERECTOMY  2012   due to fibroid  . UPPER GASTROINTESTINAL ENDOSCOPY    . [place] EXTRACTION  1995    Social History:  reports that she has never smoked. She has never used smokeless tobacco. She reports that she drinks about 1.2 oz of alcohol per week . She reports that she uses drugs, including Marijuana.  Family History:  Family History  Problem Relation Age of Onset  . Diabetes Father   . Breast cancer Paternal Aunt   . Colon cancer Paternal Grandmother      Prior to Admission medications   Medication Sig Start Date End Date Taking? Authorizing Provider  ALPRAZolam Duanne Moron) 1 MG tablet Take 0.5 mg by mouth QID.    Yes Historical Provider, MD  CLONIDINE HCL PO Take 0.5 mg by mouth daily.   Yes Historical Provider, MD  dicyclomine (BENTYL) 10 MG capsule Take 1 capsule (10  mg total) by mouth daily. Patient taking differently: Take 10 mg by mouth daily as needed.  10/05/15  Yes Gatha Mayer, MD  etonogestrel (IMPLANON) 68 MG IMPL implant Inject 1 each into the skin continuous. Reported on 01/27/2016   Yes Historical Provider, MD  fluticasone (FLONASE) 50 MCG/ACT nasal spray Place 2 sprays into both nostrils daily. 04/07/14  Yes Abner Greenspan, MD  gabapentin (NEURONTIN) 300 MG capsule Take 1 capsule  (300 mg total) by mouth 3 (three) times daily. 11/25/15  Yes Abner Greenspan, MD  MECLIZINE HCL PO Take 1 tablet by mouth at bedtime as needed (for vertigo).    Yes Historical Provider, MD  ondansetron (ZOFRAN ODT) 4 MG disintegrating tablet Take 1 tablet (4 mg total) by mouth every 8 (eight) hours as needed for nausea or vomiting. 11/15/16  Yes Abner Greenspan, MD  PARoxetine (PAXIL) 40 MG tablet Take 1 tablet (40 mg total) by mouth daily. 05/26/14  Yes Abner Greenspan, MD  promethazine (PHENERGAN) 25 MG tablet TAKE ONE TABLET BY MOUTH EVERY EIGHT HOURS AS NEEDED FOR NAUSEA OR VOMITING 07/06/16  Yes Abner Greenspan, MD  ranitidine (ZANTAC) 75 MG tablet Take 75 mg by mouth every morning.   Yes Historical Provider, MD  tetrahydrozoline 0.05 % ophthalmic solution Place 1 drop into both eyes daily as needed (for dry eyes).   Yes Historical Provider, MD  valACYclovir (VALTREX) 500 MG tablet Take 1 tablet (500 mg total) by mouth daily. 11/15/16  Yes Abner Greenspan, MD    Physical Exam: Vitals:   11/21/16 2123 11/22/16 0059  BP: (!) 185/116 (!) 197/116  Pulse: 75 69  Resp: 18 18  Temp: 98 F (36.7 C) 99.9 F (37.7 C)  TempSrc:  Oral  SpO2: 100% 100%   General: Not in acute distress. Dry mucus and membrane. HEENT:       Eyes: PERRL, EOMI, no scleral icterus.       ENT: No discharge from the ears and nose, no pharynx injection, no tonsillar enlargement.        Neck: No JVD, no bruit, no mass felt. Heme: No neck lymph node enlargement. Cardiac: S1/S2, RRR, No murmurs, No gallops or rubs. Respiratory: No rales, wheezing, rhonchi or rubs. GI: Soft, nondistended, nontender, no rebound pain, no organomegaly, BS present. GU: No hematuria Ext: No pitting leg edema bilaterally. 2+DP/PT pulse bilaterally. Musculoskeletal: No joint deformities, No joint redness or warmth, no limitation of ROM in spin. Skin: No rashes.  Neuro: Alert, oriented X3, cranial nerves II-XII grossly intact, moves all extremities normally.   Psych: Patient is not psychotic, no suicidal or hemocidal ideation.  Labs on Admission: I have personally reviewed following labs and imaging studies  CBC:  Recent Labs Lab 11/21/16 2137  WBC 9.6  HGB 16.5*  HCT 45.9  MCV 93.7  PLT [id number]   Basic Metabolic Panel:  Recent Labs Lab 11/21/16 2137  NA 138  K 3.9  CL 103  CO2 24  GLUCOSE 121*  BUN 31*  CREATININE 3.05*  CALCIUM 9.5   GFR: CrCl cannot be calculated (Unknown ideal weight.). Liver Function Tests:  Recent Labs Lab 11/21/16 2137  AST 36  ALT 27  ALKPHOS 51  BILITOT 1.3*  PROT 8.8*  ALBUMIN 5.0    Recent Labs Lab 11/21/16 2137  LIPASE 48   No results for input(s): AMMONIA in the last 168 hours. Coagulation Profile: No results for input(s): INR, PROTIME in the last 168 hours. Cardiac  Enzymes: No results for input(s): CKTOTAL, CKMB, CKMBINDEX, TROPONINI in the last 168 hours. BNP (last 3 results) No results for input(s): PROBNP in the last 8760 hours. HbA1C: No results for input(s): HGBA1C in the last 72 hours. CBG: No results for input(s): GLUCAP in the last 168 hours. Lipid Profile: No results for input(s): CHOL, HDL, LDLCALC, TRIG, CHOLHDL, LDLDIRECT in the last 72 hours. Thyroid Function Tests: No results for input(s): TSH, T4TOTAL, FREET4, T3FREE, THYROIDAB in the last 72 hours. Anemia Panel: No results for input(s): VITAMINB12, FOLATE, FERRITIN, TIBC, IRON, RETICCTPCT in the last 72 hours. Urine analysis:    Component Value Date/Time   COLORURINE YELLOW 11/22/2016 0045   APPEARANCEUR HAZY (A) 11/22/2016 0045   LABSPEC 1.015 11/22/2016 0045   PHURINE 5.0 11/22/2016 0045   GLUCOSEU 50 (A) 11/22/2016 0045   HGBUR LARGE (A) 11/22/2016 0045   HGBUR moderate 12/10/2007 1434   BILIRUBINUR NEGATIVE 11/22/2016 0045   BILIRUBINUR neg 10/01/2012 1552   KETONESUR 5 (A) 11/22/2016 0045   PROTEINUR NEGATIVE 11/22/2016 0045   UROBILINOGEN 1.0 05/21/2015 0445   NITRITE NEGATIVE 11/22/2016 0045    LEUKOCYTESUR LARGE (A) 11/22/2016 0045   Sepsis Labs: @LABRCNTIP (procalcitonin:4,lacticidven:4) )No results found for this or any previous visit (from the past 240 hour(s)).   Radiological Exams on Admission: No results found.   EKG:  Not done in ED, will get one.   Assessment/Plan Principal Problem:   Nausea & vomiting Active Problems:   Essential hypertension   HSV-2 infection   UTI (urinary tract infection)   Acute-on-chronic kidney injury-II   Nausea & vomiting: Etiology is not clear. Differential diagnoses include viral gastroenteritis, drug abuse (history of marijuana abuse), gastroparesis and possible UTI.   -will place on med-surg bed for obs -When necessary Zofran for nausea -IV pepcid -IV Reglan 5 mg 3 times a day -IV fluids: 2 L normal saline, then 125 mL per hour  Possible UTI:  patient denies dysuria, burning on urination, increased urinary frequency or hematuria, but she has positive urinalysis with large amount of leukocytes. Given her worsening renal function and severe nausea/vomiting, will treat her as UTI -IV Rocephin -Follow up urine culture  HTN: Patient is taking clonidine 0.5 mg daily which is unusual dosing. -continue clonidine, but will change to 0.1 mg tid -IV hydralazine when necessary  HSV-2 infection: -continue home Valtrex  Depression and anxiety: Stable, no suicidal or homicidal ideations. -Continue home medications: Paxil and Xanax  Acute-on-chronic kidney injury-II: Baseline creatinine 1.0. Her creatinine is 3.05.  Likely due to prerenal secondary to dehydration. UTI may have also contributed partially. - IVF as above - Check FeNa - Follow up renal function by BMP  GERD: - on Pepcid IV   DVT ppx:  SQ Lovenox Code Status: Full code Family Communication: None at bed side.   Disposition Plan:  Anticipate discharge back to previous home environment Consults called:  none Admission status: medical floor/obs    Date of Service  11/22/2016    Ivor Costa [facility] Pager [phone]  If 7PM-7AM, please contact night-coverage [email or web address] Password [email or web address] 11/22/2016, 2:42 AM

## 2016-11-22 NOTE — Telephone Encounter (Signed)
Per chart review tab pt was admitted to [facility] on 11/21/16.

## 2016-11-22 NOTE — Telephone Encounter (Signed)
PLEASE NOTE: All timestamps contained within this report are represented as [place] Standard Time. CONFIDENTIALTY NOTICE: This fax transmission is intended only for the addressee. It contains information that is legally privileged, confidential or otherwise protected from use or disclosure. If you are not the intended recipient, you are strictly prohibited from reviewing, disclosing, copying using or disseminating any of this information or taking any action in reliance on or regarding this information. If you have received this fax in error, please notify us immediately by telephone so that we can arrange for its return to [place]. Phone: [phone], Toll-Free: [phone], Fax: [phone] Page: 1 of 2 Call Id: [id number] [place] Patient Name: Dana Greer Gender: Female DOB: 1971/06/28 Age: 46 Y 2 M 4 D Return Phone Number: [phone] (Primary), [phone] (Secondary) Address: City/State/Zip: [place] Client [place] - Client Client Site [place] MD Contact Type Call Who Is Calling Patient / Member / Family / Caregiver Call Type Triage / Clinical Caller Name Elon Spanner Relationship To Patient Mother Return Phone Number [phone] (Secondary) Chief Complaint VOMITING - Blood Reason for Call Symptomatic / Request for Health Information Initial Comment States dtr is vomiting blood. States had a fever earlier. Has chills and sweating. Appointment Disposition EMR Appointment Not Necessary Info pasted into Epic No PreDisposition Did not know what to do Translation No Nurse Assessment Nurse: Mills-Hernandez, RN, Izora Gala Date/Time (Eastern Time): 11/21/2016 8:08:17 PM Confirm and document reason for call. If symptomatic, describe symptoms. ---Caller states that her daughter is vomiting with blood in  it. Right now just dry heaving. States had a fever earlier. Has chills and sweating. Does the patient have any new or worsening symptoms? ---Yes Will a triage be completed? ---Yes Related visit to physician within the last 2 weeks? ---No Does the PT have any chronic conditions? (i.e. diabetes, asthma, etc.) ---Yes List chronic conditions. ---HTN Is the patient pregnant or possibly pregnant? (Ask all females between the ages of 38-55) ---No Is this a behavioral health or substance abuse call? ---No Guidelines Guideline Title Affirmed Question Affirmed Notes Nurse Date/Time (Eastern Time) Vomiting Blood [1] Vomited blood AND [2] more than a few streaks of blood (Exception: few streaks and only occurred once) (Exception: swallowed Mills-Hernandez, RN, Izora Gala 11/21/2016 8:10:11 PM PLEASE NOTE: All timestamps contained within this report are represented as [place] Standard Time. CONFIDENTIALTY NOTICE: This fax transmission is intended only for the addressee. It contains information that is legally privileged, confidential or otherwise protected from use or disclosure. If you are not the intended recipient, you are strictly prohibited from reviewing, disclosing, copying using or disseminating any of this information or taking any action in reliance on or regarding this information. If you have received this fax in error, please notify us immediately by telephone so that we can arrange for its return to [place]. Phone: [phone], Toll-Free: [phone], Fax: [phone] Page: 2 of 2 Call Id: [id number] Guidelines Guideline Title Affirmed Question Affirmed Notes Nurse Date/Time [name] Time) blood from a nosebleed or cut in the mouth) Disp. Time [name] Time) Disposition Final User 11/21/2016 8:03:54 PM Send to Urgent [facility] Coons, [facility] 11/21/2016 8:14:27 PM Go to ED Now Yes Mills-Hernandez, RN, Cindee Salt Understands: Yes Disagree/Comply: Comply Care Advice Given Per Guideline GO  TO ED NOW: You need to be seen in the Emergency Department. Go to the ER at ___________ [place]  now. Drive carefully. CARE ADVICE given per Vomiting Blood (Adult) guideline. BRING MEDICINES: * Please bring a list of your current medicines when you go to the Emergency Department (ER). * It is also a good idea to bring the pill bottles too. This will help the doctor to make certain you are taking the right medicines and the right dose. Comments User: Haroldine Laws, RN Date/Time [name] Time): 11/21/2016 8:15:15 PM This call should have been [place]. Referrals [facility] - ED

## 2016-11-22 NOTE — Progress Notes (Signed)
11/21/2016  9:19 PM  11/22/2016 1:16 PM  Dana Greer was seen and examined.  The H&P by the admitting provider , orders, imaging was reviewed.  Please see orders.  Will continue to follow.  Pt has some improvement in renal function but not medically ready to be discharged today.  Update to impatient status. Try to advance diet.   Murvin Natal, MD [facility]

## 2016-11-23 DIAGNOSIS — I10 Essential (primary) hypertension: Secondary | ICD-10-CM

## 2016-11-23 DIAGNOSIS — N179 Acute kidney failure, unspecified: Secondary | ICD-10-CM

## 2016-11-23 DIAGNOSIS — R112 Nausea with vomiting, unspecified: Secondary | ICD-10-CM

## 2016-11-23 DIAGNOSIS — B009 Herpesviral infection, unspecified: Secondary | ICD-10-CM

## 2016-11-23 DIAGNOSIS — N182 Chronic kidney disease, stage 2 (mild): Secondary | ICD-10-CM

## 2016-11-23 LAB — URINE CULTURE

## 2016-11-23 LAB — BASIC METABOLIC PANEL
ANION GAP: 8 (ref 5–15)
BUN: 16 mg/dL (ref 6–20)
CHLORIDE: 107 mmol/L (ref 101–111)
CO2: 22 mmol/L (ref 22–32)
Calcium: 8.7 mg/dL — ABNORMAL LOW (ref 8.9–10.3)
Creatinine, Ser: 1.21 mg/dL — ABNORMAL HIGH (ref 0.44–1.00)
GFR calc Af Amer: >60 mL/min (ref >60)
GFR, EST NON AFRICAN AMERICAN: 53 mL/min — AB (ref >60)
Glucose, Bld: 102 mg/dL — ABNORMAL HIGH (ref 65–99)
POTASSIUM: 3.3 mmol/L — AB (ref 3.5–5.1)
Sodium: 137 mmol/L (ref 135–145)

## 2016-11-23 MED ORDER — FAMOTIDINE 20 MG PO TABS: 20.0000 mg | ORAL_TABLET | Freq: Two times a day (BID) | ORAL | Status: DC

## 2016-11-23 MED ORDER — POTASSIUM CHLORIDE CRYS ER 20 MEQ PO TBCR
40.0000 meq | EXTENDED_RELEASE_TABLET | Freq: Once | ORAL | Status: AC
  Administered 2016-11-23: 40 meq via ORAL
  Filled 2016-11-23: qty 2

## 2016-11-23 MED ORDER — DICYCLOMINE HCL 10 MG PO CAPS: 10.0000 mg | ORAL_CAPSULE | Freq: Every day | ORAL | Status: DC | PRN

## 2016-11-23 NOTE — Progress Notes (Signed)
PHARMACIST - PHYSICIAN COMMUNICATION  DR:   TRH  CONCERNING: IV to Oral Route Change Policy  RECOMMENDATION: This patient is receiving famotidine by the intravenous route.  Based on criteria approved by the Pharmacy and Therapeutics Committee, the intravenous medication(s) is/are being converted to the equivalent oral dose form(s).   DESCRIPTION: These criteria include:  The patient is eating (either orally or via tube) and/or has been taking other orally administered medications for a least 24 hours  The patient has no evidence of active gastrointestinal bleeding or impaired GI absorption (gastrectomy, short bowel, patient on TNA or NPO).  If you have questions about this conversion, please contact the Pharmacy Department  []   [phone] )  [facility] []   [phone] )  [facility] []   [phone] )  [facility] []   [phone] )  [facility] [x]   [phone] )  [place], [place], [facility] 11/23/2016 12:14 PM

## 2016-11-23 NOTE — Discharge Summary (Signed)
Physician Discharge Summary  AMALIA WEBB Goins [id number] DOB: 1971/03/21  PCP: Loura Pardon, MD  Admit date: 11/21/2016 Discharge date: 11/23/2016  Recommendations for Outpatient Follow-up:  1. Dr. Loura Pardon, PCPIn one week with repeat labs (CBC & BMP).  Home Health: None Equipment/Devices: None    Discharge Condition: Improved and stable  CODE STATUS: Full  Diet recommendation: Heart healthy diet.  Discharge Diagnoses:  Principal Problem:   Nausea & vomiting Active Problems:   Essential hypertension   HSV-2 infection   UTI (urinary tract infection)   Acute-on-chronic kidney injury-II   Brief/Interim Summary: Dana Greer is a 46 y.o. female with medical history significant of hypertension, GERD, depression, anxiety, stroke, immunodeficiency disorder, HSV-2, CKD-II, who presented with complaints of nausea and vomiting. She stated that she started having nausea and nonbloody emesis that started 2 days prior to current admission which progressively got worse. She vomited up to 5 times on the day of admission. She denied diarrhea but reported abdominal cramping after episodes of emesis but no abdominal pain at rest. She denied any sick contacts with similar complaints or eating anything unusual. She states that she did not overdo her marijuana. She gives prior history of intermittent episodes of nausea and vomiting of unclear etiology. She specifically denied dysuria, urinary frequency, fever or chills. She gives history of chronic microscopic hematuria for which she is followed at [facility] urology. EGD in the past has shown mild gastritis in 2016. In the ED, she did not have leukocytosis, lipase was normal, creatinine of 3.05 and blood pressure of 197/116. Admitted for further evaluation and management.  Assessment and plan:  1. Nausea and vomiting: Possibly from acute viral gastroenteritis versus THC abuse (seems less likely) versus gastroparesis. No clinical UTI.  Treated supportively with bowel rest, IV fluids, antiemetics, H2 blockers and Reglan. Clinically improved. Diet was advanced which she has tolerated. She has not had any further episodes of nausea and vomiting since admission. Had one episode of loose BM today. Advised her to gradually advance diet to soft and then regular diet as tolerated and adequate hydration. Also advised to minimize and stop THC. She verbalized understanding. 2. Asymptomatic bacteriuria: Empirically started on IV Rocephin although she did not have any urinary symptoms. Urine culture shows multiple species suggestive of contamination. Discontinued antibiotics. 3. Microscopic hematuria: States that she has had this for a long time and is followed at [facility] urology. 4. Essential hypertension: Admission blood pressures were likely elevated related to stress response. Confirmed home dose of clonidine and advised to follow-up with PCP regarding further management. No changes made to home medications, at discharge. 5. HSV 2 infection: Continue Valtrex. 6. Anxiety and depression: Stable. No suicidal or homicidal ideations. Continue Xanax and Paxil. 7. Acute on stage II chronic kidney disease: Baseline creatinine: 1. Admitted with creatinine of 3.05. Likely secondary to problem #1. Hydrated with IV fluids. Creatinine has gradually improved to 1.21. Advised her regarding adequate hydration. Follow BMP in a few days as outpatient. 8. GERD: Continue H2 blockers at discharge. 9. Hypokalemia: Replaced prior to discharge.   Discharge Instructions  Discharge Instructions    Call MD for:  extreme fatigue    Complete by:  As directed    Call MD for:  persistant dizziness or light-headedness    Complete by:  As directed    Call MD for:  persistant nausea and vomiting    Complete by:  As directed    Call MD for:  severe uncontrolled pain  Complete by:  As directed    Call MD for:  temperature >100.4    Complete by:  As directed    Diet  - low sodium heart healthy    Complete by:  As directed    Increase activity slowly    Complete by:  As directed        Medication List    TAKE these medications   ALPRAZolam 1 MG tablet Commonly known as:  XANAX Take 0.5 mg by mouth QID.   CLONIDINE HCL PO Take 0.5 mg by mouth daily.   dicyclomine 10 MG capsule Commonly known as:  BENTYL Take 1 capsule (10 mg total) by mouth daily as needed for spasms. What changed:  when to take this  reasons to take this   fluticasone 50 MCG/ACT nasal spray Commonly known as:  FLONASE Place 2 sprays into both nostrils daily.   gabapentin 300 MG capsule Commonly known as:  NEURONTIN Take 1 capsule (300 mg total) by mouth 3 (three) times daily.   IMPLANON 68 MG Impl implant Generic drug:  etonogestrel Inject 1 each into the skin continuous. Reported on 01/27/2016   MECLIZINE HCL PO Take 1 tablet by mouth at bedtime as needed (for vertigo).   ondansetron 4 MG disintegrating tablet Commonly known as:  ZOFRAN ODT Take 1 tablet (4 mg total) by mouth every 8 (eight) hours as needed for nausea or vomiting.   PARoxetine 40 MG tablet Commonly known as:  PAXIL Take 1 tablet (40 mg total) by mouth daily.   promethazine 25 MG tablet Commonly known as:  PHENERGAN TAKE ONE TABLET BY MOUTH EVERY EIGHT HOURS AS NEEDED FOR NAUSEA OR VOMITING   ranitidine 75 MG tablet Commonly known as:  ZANTAC Take 75 mg by mouth every morning.   tetrahydrozoline 0.05 % ophthalmic solution Place 1 drop into both eyes daily as needed (for dry eyes).   valACYclovir 500 MG tablet Commonly known as:  VALTREX Take 1 tablet (500 mg total) by mouth daily.      Follow-up [place], MD Follow up in 1 week(s).   Specialties:  Family Medicine, Radiology Why:  To be seen with repeat labs (CBC & BMP). Contact information: [place] [phone]          Allergies  Allergen Reactions  . Trazodone  And Nefazodone Nausea And Vomiting  . Amoxicillin Other (See Comments)    Yeast infection   . Carafate [Sucralfate] Nausea And Vomiting    Consultations:  None   Procedures/Studies: No imaging studies were done during this admission.   Subjective: Feels significantly better. No nausea or vomiting since admission. Tolerating diet. One episode of loose stools this morning. Denies sick contacts with similar symptoms, eating anything unusual prior to admission or using excess of THC. Eager to go home.  Discharge Exam:  Vitals:   11/22/16 1337 11/22/16 1545 11/22/16 2120 11/23/16 0517  BP: 116/84 (!) 117/93 108/70 (!) 147/86  Pulse: 79  66 100  Resp: 18  16 16   Temp: 98.6 F (37 C)  98.4 F (36.9 C) 98.6 F (37 C)  TempSrc: Oral  Oral Oral  SpO2: 97%  100% 97%  Weight:      Height:        General: Pt lying comfortably in bed & appears in no obvious distress. Cardiovascular: S1 & S2 heard, RRR, S1/S2 +. No murmurs, rubs, gallops or clicks. No JVD or pedal edema.  Respiratory: Clear to auscultation without wheezing, rhonchi or crackles. No increased work of breathing. Abdominal:  Non distended, non tender & soft. No organomegaly or masses appreciated. Normal bowel sounds heard. CNS: Alert and oriented. No focal deficits. Extremities: no edema, no cyanosis    The results of significant diagnostics from this hospitalization (including imaging, microbiology, ancillary and laboratory) are listed below for reference.     Microbiology: Recent Results (from the past 240 hour(s))  Urine culture     Status: Abnormal   Collection Time: 11/22/16 12:45 AM  Result Value Ref Range Status   Specimen Description URINE, RANDOM  Final   Special Requests NONE  Final   Culture MULTIPLE SPECIES PRESENT, SUGGEST RECOLLECTION (A)  Final   Report Status 11/23/2016 FINAL  Final     Labs:  Basic Metabolic Panel:  Recent Labs Lab 11/21/16 2137 11/22/16 0322 11/23/16 1114  NA 138 138  137  K 3.9 3.4* 3.3*  CL 103 105 107  CO2 24 23 22   GLUCOSE 121* 132* 102*  BUN 31* 25* 16  CREATININE 3.05* 2.12* 1.21*  CALCIUM 9.5 9.2 8.7*   Liver Function Tests:  Recent Labs Lab 11/21/16 2137  AST 36  ALT 27  ALKPHOS 51  BILITOT 1.3*  PROT 8.8*  ALBUMIN 5.0    Recent Labs Lab 11/21/16 2137  LIPASE 48   CBC:  Recent Labs Lab 11/21/16 2137 11/22/16 0322  WBC 9.6 12.7*  HGB 16.5* 16.0*  HCT 45.9 44.8  MCV 93.7 91.6  PLT 174 158    Urinalysis    Component Value Date/Time   COLORURINE YELLOW 11/22/2016 0045   APPEARANCEUR HAZY (A) 11/22/2016 0045   LABSPEC 1.015 11/22/2016 0045   PHURINE 5.0 11/22/2016 0045   GLUCOSEU 50 (A) 11/22/2016 0045   HGBUR LARGE (A) 11/22/2016 0045   HGBUR moderate 12/10/2007 1434   BILIRUBINUR NEGATIVE 11/22/2016 0045   BILIRUBINUR neg 10/01/2012 1552   KETONESUR 5 (A) 11/22/2016 0045   PROTEINUR NEGATIVE 11/22/2016 0045   UROBILINOGEN 1.0 05/21/2015 0445   NITRITE NEGATIVE 11/22/2016 0045   LEUKOCYTESUR LARGE (A) 11/22/2016 0045      Time coordinating discharge: Over 30 minutes  SIGNED:  Vernell Leep, MD, FACP, FHM. [facility] Pager [phone]  If 7PM-7AM, please contact night-coverage [email or web address] Password [email or web address] 11/23/2016, 2:08 PM

## 2016-11-23 NOTE — Discharge Instructions (Signed)
Viral Gastroenteritis, Adult °Viral gastroenteritis is also known as the stomach flu. This condition is caused by various viruses. These viruses can be passed from person to person very easily (are very contagious). This condition may affect your stomach, small intestine, and large intestine. It can cause sudden watery diarrhea, fever, and vomiting. °Diarrhea and vomiting can make you feel weak and cause you to become dehydrated. You may not be able to keep fluids down. Dehydration can make you tired and thirsty, cause you to have a dry mouth, and decrease how often you urinate. Older adults and people with other diseases or a weak immune system are at higher risk for dehydration. °It is important to replace the fluids that you lose from diarrhea and vomiting. If you become severely dehydrated, you may need to get fluids through an IV tube. °What are the causes? °Gastroenteritis is caused by various viruses, including rotavirus and norovirus. Norovirus is the most common cause in adults. °You can get sick by eating food, drinking water, or touching a surface contaminated with one of these viruses. You can also get sick from sharing utensils or other personal items with an infected person. °What increases the risk? °This condition is more likely to develop in people: °· Who have a weak defense system (immune system). °· Who live with one or more children who are younger than 2 years old. °· Who live in a nursing home. °· Who go on cruise ships. °What are the signs or symptoms? °Symptoms of this condition start suddenly 1-2 days after exposure to a virus. Symptoms may last a few days or as long as a week. The most common symptoms are watery diarrhea and vomiting. Other symptoms include: °· Fever. °· Headache. °· Fatigue. °· Pain in the abdomen. °· Chills. °· Weakness. °· Nausea. °· Muscle aches. °· Loss of appetite. °How is this diagnosed? °This condition is diagnosed with a medical history and physical exam. You may  also have a stool test to check for viruses or other infections. °How is this treated? °This condition typically goes away on its own. The focus of treatment is to restore lost fluids (rehydration). Your health care provider may recommend that you take an oral rehydration solution (ORS) to replace important salts and minerals (electrolytes) in your body. Severe cases of this condition may require giving fluids through an IV tube. °Treatment may also include medicine to help with your symptoms. °Follow these instructions at home: °Follow instructions from your health care provider about how to care for yourself at home. °Eating and drinking °Follow these recommendations as told by your health care provider: °· Take an ORS. This is a drink that is sold at pharmacies and retail stores. °· Drink clear fluids in small amounts as you are able. Clear fluids include water, ice chips, diluted fruit juice, and low-calorie sports drinks. °· Eat bland, easy-to-digest foods in small amounts as you are able. These foods include bananas, applesauce, rice, lean meats, toast, and crackers. °· Avoid fluids that contain a lot of sugar or caffeine, such as energy drinks, sports drinks, and soda. °· Avoid alcohol. °· Avoid spicy or fatty foods. °General instructions °· Drink enough fluid to keep your urine clear or pale yellow. °· Wash your hands often. If soap and water are not available, use hand sanitizer. °· Make sure that all people in your household wash their hands well and often. °· Take over-the-counter and prescription medicines only as told by your health care provider. °· Rest   at home while you recover.  Watch your condition for any changes.  Take a warm bath to relieve any burning or pain from frequent diarrhea episodes.  Keep all follow-up visits as told by your health care provider. This is important. Contact a health care provider if:  You cannot keep fluids down.  Your symptoms get worse.  You have new  symptoms.  You feel light-headed or dizzy.  You have muscle cramps. Get help right away if:  You have chest pain.  You feel extremely weak or you faint.  You see blood in your vomit.  Your vomit looks like coffee grounds.  You have bloody or black stools or stools that look like tar.  You have a severe headache, a stiff neck, or both.  You have a rash.  You have severe pain, cramping, or bloating in your abdomen.  You have trouble breathing or you are breathing very quickly.  Your heart is beating very quickly.  Your skin feels cold and clammy.  You feel confused.  You have pain when you urinate.  You have signs of dehydration, such as:  Dark urine, very little urine, or no urine.  Cracked lips.  Dry mouth.  Sunken eyes.  Sleepiness.  Weakness. This information is not intended to replace advice given to you by your health care provider. Make sure you discuss any questions you have with your health care provider. Document Released: 10/17/2005 Document Revised: 03/30/2016 Document Reviewed: 06/23/2015 Elsevier Interactive Patient Education  2017 [employer]   Acute Kidney Injury, Adult Acute kidney injury (AKI) occurs when there is sudden (acute) damage to the kidneys.A small amount of kidney damage may not cause problems, but a large amount of damage may make it difficult or impossible for the kidneys to work the way they should. AKI may develop into long-lasting (chronic) kidney disease. Early detection and treatment of AKI may prevent kidney damage from becoming permanent or getting worse. What are the causes? Common causes of this condition include:  A problem with blood flow to the kidneys. This may be caused by:  Blood loss.  Heart and blood vessel (cardiovascular) disease.  Severe burns.  Liver disease.  Direct damage to the kidneys. This may be caused by:  Some medicines.  A kidney infection.  Poisoning.  Being around or in contact  with poisonous (toxic) substances.  A surgical wound.  A hard, direct force to the kidney area.  A sudden blockage of urine flow. This may be caused by:  Cancer.  Kidney stones.  Enlarged prostate in males. What are the signs or symptoms? Symptoms develop slowly and may not be obvious until the kidney damage becomes severe. It is possible to have AKI for years without showing any symptoms. Symptoms of this condition can include:  Swelling (edema) of the face, legs, ankles, or feet.  Numbness, tingling, or loss of feeling (sensation) in the hands or feet.  Tiredness (lethargy).  Nausea or vomiting.  Confusion or trouble concentrating.  Problems with urination, such as:  Painful or burning feeling during urination.  Decreased urine production.  Frequent urination, especially at night.  Bloody urine.  Muscle twitches and cramps, especially in the legs.  Shortness of breath.  Weakness.  Constant itchiness.  Loss of appetite.  Metallic taste in the mouth.  Trouble sleeping.  Pale lining of the eyelids and surface of the eye (conjunctiva). How is this diagnosed? This condition may be diagnosed with various tests. Tests may include:  Blood tests.  Urine tests.  Imaging tests.  A test in which a sample of tissue is removed from the kidneys to be looked at under a microscope (kidney biopsy). How is this treated? Treatment of AKIvaries depending on the cause and severity of the kidney damage. In mild cases, treatment may not be needed. The kidneys may heal on their own. If AKI is more severe, your health care provider will treat the cause of the kidney damage, help the kidneys heal, and prevent problems from occurring. Severe cases may require a procedure to remove toxic wastes from the body (dialysis) or surgery to repair kidney damage. Surgery may involve:  Repair of a torn kidney.  Removal of a urine flow obstruction. Follow these instructions at  home:  Follow your prescribed diet.  Take over-the-counter and prescription medicines only as told by your health care provider.  Do not take any new medicines unless approved by your health care provider. Many medicines can worsen your kidney damage.  Do not take any vitamin and mineral supplements unless approved by your health care provider. Many nutritional supplements can worsen your kidney damage.  The dose of some medicines that you take may need to be adjusted.  Do not use any tobacco products, such as cigarettes, chewing tobacco, and e-cigarettes. If you need help quitting, ask your health care provider.  Keep all follow-up visits as told by your health care provider. This is important.  Keep track of your blood pressure. Report changes in your blood pressure as told by your health care provider.  Achieve and maintain a healthy weight. If you need help with this, ask your health care provider.  Start or continue an exercise plan. Try to exercise at least 30 minutes a day, 5 days a week.  Stay current with immunizations as told by your health care provider. Where to find more information:  American Association of Kidney Patients: BombTimer.gl  National Kidney Foundation: www.kidney.[place]: https://mathis.com/  [facility]: www.lifeoptions.org and www.kidneyschool.org Contact a health care provider if:  Your symptoms get worse.  You develop new symptoms. Get help right away if:  You develop symptoms of end-stage kidney disease, which include:  Headaches.  Abnormally dark or light skin.  Numbness in the hands or feet.  Easy bruising.  Frequent hiccups.  Chest pain.  Shortness of breath.  End of menstruation in women.  You have a fever.  You have decreased urine production.  You have pain or bleeding when you urinate. This information is not intended to replace advice given to you by your health care provider.  Make sure you discuss any questions you have with your health care provider. Document Released: 05/02/2011 Document Revised: 05/26/2016 Document Reviewed: 06/15/2012 Elsevier Interactive Patient Education  2017 [employer].   Additional discharge instructions:  Please get your medications reviewed and adjusted by your Primary MD.  Please request your Primary MD to go over all Hospital Tests and Procedure/Radiological results at the follow up, please get all Hospital records sent to your Prim MD by signing hospital release before you go home.  If you had Pneumonia of Lung problems at the Hospital: Please get a 2 view Chest X ray done in 6-8 weeks after hospital discharge or sooner if instructed by your Primary MD.  If you have Congestive Heart Failure: Please call your Cardiologist or Primary MD anytime you have any of the following symptoms:  1) 3 pound weight gain in 24 hours or 5 pounds in  1 week  2) shortness of breath, with or without a dry hacking cough  3) swelling in the hands, feet or stomach  4) if you have to sleep on extra pillows at night in order to breathe  Follow cardiac low salt diet and 1.5 lit/day fluid restriction.  If you have diabetes Accuchecks 4 times/day, Once in AM empty stomach and then before each meal. Log in all results and show them to your primary doctor at your next visit. If any glucose reading is under 80 or above 300 call your primary MD immediately.  If you have Seizure/Convulsions/Epilepsy: Please do not drive, operate heavy machinery, participate in activities at heights or participate in high speed sports until you have seen by Primary MD or a Neurologist and advised to do so again.  If you had Gastrointestinal Bleeding: Please ask your Primary MD to check a complete blood count within one week of discharge or at your next visit. Your endoscopic/colonoscopic biopsies that are pending at the time of discharge, will also need to followed by your  Primary MD.  Get Medicines reviewed and adjusted. Please take all your medications with you for your next visit with your Primary MD  Please request your Primary MD to go over all hospital tests and procedure/radiological results at the follow up, please ask your Primary MD to get all Hospital records sent to his/her office.  If you experience worsening of your admission symptoms, develop shortness of breath, life threatening emergency, suicidal or homicidal thoughts you must seek medical attention immediately by calling 911 or calling your MD immediately  if symptoms less severe.  You must read complete instructions/literature along with all the possible adverse reactions/side effects for all the Medicines you take and that have been prescribed to you. Take any new Medicines after you have completely understood and accpet all the possible adverse reactions/side effects.   Do not drive or operate heavy machinery when taking Pain medications.   Do not take more than prescribed Pain, Sleep and Anxiety Medications  Special Instructions: If you have smoked or chewed Tobacco  in the last 2 yrs please stop smoking, stop any regular Alcohol  and or any Recreational drug use.  Wear Seat belts while driving.  Please note You were cared for by a hospitalist during your hospital stay. If you have any questions about your discharge medications or the care you received while you were in the hospital after you are discharged, you can call the unit and asked to speak with the hospitalist on call if the hospitalist that took care of you is not available. Once you are discharged, your primary care physician will handle any further medical issues. Please note that NO REFILLS for any discharge medications will be authorized once you are discharged, as it is imperative that you return to your primary care physician (or establish a relationship with a primary care physician if you do not have one) for your aftercare  needs so that they can reassess your need for medications and monitor your lab values.  You can reach the hospitalist office at phone [phone] or fax [phone]   If you do not have a primary care physician, you can call [phone] for a physician referral.

## 2016-11-24 ENCOUNTER — Telehealth: Payer: Self-pay

## 2016-11-24 NOTE — Telephone Encounter (Signed)
Transition Care Management Follow-up Telephone Call   Date discharged 11/23/16   How have you been since you were released from the hospital? Weak, concerned about my kidneys, slowly improving   Do you understand why you were in the hospital? Yes   Do you understand the discharge instructions? Yes   Where were you discharged to? Home but staying with her mother right now.   Items Reviewed:  Medications reviewed: Yes  Allergies reviewed: Yes  Dietary changes reviewed: Yes and discussed clear liquid progressing to bland food diet  Referrals reviewed: No current referrals at this time   Functional Questionnaire:   Activities of Daily Living (ADLs):   She states they are independent in the following: toileting, feeding and continence States they require assistance with the following: Mom provides stand by only assist for safety with dressing, grooming, ambulation and bathing.  Concerned for falls due to weakness and imbalance.   Any transportation issues/concerns?: Patient's mother handles all transportation needs.   Any patient concerns? She is concerned about her kidneys but is happy she will be seeing Dr. Glori Bickers on Monday for evaluation.   Confirmed importance and date/time of follow-up visits scheduled Yes  Provider Appointment booked with Dr. Glori Bickers on Monday, 11/28/16 9:30am.  Confirmed with patient if condition begins to worsen call PCP or go to the ER.  Patient was given the office number and encouraged to call back with question or concerns.  : Yes.

## 2016-11-24 NOTE — Telephone Encounter (Signed)
Left message on patient's cell/home number (ok per DPR) to call me asap to follow up from recent hospital discharge.

## 2016-11-28 ENCOUNTER — Encounter: Payer: Self-pay | Admitting: Family Medicine

## 2016-11-28 ENCOUNTER — Ambulatory Visit (INDEPENDENT_AMBULATORY_CARE_PROVIDER_SITE_OTHER): Payer: Medicare Other | Admitting: Family Medicine

## 2016-11-28 VITALS — BP 126/80 | HR 81 | Temp 99.2°F | Ht 62.5 in | Wt 202.2 lb

## 2016-11-28 DIAGNOSIS — N289 Disorder of kidney and ureter, unspecified: Secondary | ICD-10-CM

## 2016-11-28 DIAGNOSIS — B373 Candidiasis of vulva and vagina: Secondary | ICD-10-CM | POA: Insufficient documentation

## 2016-11-28 DIAGNOSIS — B3731 Acute candidiasis of vulva and vagina: Secondary | ICD-10-CM | POA: Insufficient documentation

## 2016-11-28 DIAGNOSIS — D696 Thrombocytopenia, unspecified: Secondary | ICD-10-CM | POA: Diagnosis not present

## 2016-11-28 DIAGNOSIS — F4323 Adjustment disorder with mixed anxiety and depressed mood: Secondary | ICD-10-CM

## 2016-11-28 DIAGNOSIS — R4589 Other symptoms and signs involving emotional state: Secondary | ICD-10-CM | POA: Diagnosis not present

## 2016-11-28 DIAGNOSIS — I10 Essential (primary) hypertension: Secondary | ICD-10-CM | POA: Diagnosis not present

## 2016-11-28 DIAGNOSIS — R112 Nausea with vomiting, unspecified: Secondary | ICD-10-CM | POA: Diagnosis not present

## 2016-11-28 DIAGNOSIS — N179 Acute kidney failure, unspecified: Secondary | ICD-10-CM

## 2016-11-28 LAB — CBC WITH DIFFERENTIAL/PLATELET
BASOS ABS: 0 10*3/uL (ref 0.0–0.1)
Basophils Relative: 0.2 % (ref 0.0–3.0)
EOS ABS: 0.1 10*3/uL (ref 0.0–0.7)
Eosinophils Relative: 1.7 % (ref 0.0–5.0)
HEMATOCRIT: 39.9 % (ref 36.0–46.0)
HEMOGLOBIN: 13.5 g/dL (ref 12.0–15.0)
LYMPHS PCT: 40.4 % (ref 12.0–46.0)
Lymphs Abs: 2.4 10*3/uL (ref 0.7–4.0)
MCHC: 33.9 g/dL (ref 30.0–36.0)
MCV: 97.4 fl (ref 78.0–100.0)
Monocytes Absolute: 0.4 10*3/uL (ref 0.1–1.0)
Monocytes Relative: 6.4 % (ref 3.0–12.0)
NEUTROS ABS: 3 10*3/uL (ref 1.4–7.7)
Neutrophils Relative %: 51.3 % (ref 43.0–77.0)
PLATELETS: 136 10*3/uL — AB (ref 150.0–400.0)
RBC: 4.1 Mil/uL (ref 3.87–5.11)
RDW: 13.5 % (ref 11.5–15.5)
WBC: 5.9 10*3/uL (ref 4.0–10.5)

## 2016-11-28 LAB — BASIC METABOLIC PANEL
BUN: 9 mg/dL (ref 6–23)
CALCIUM: 9.1 mg/dL (ref 8.4–10.5)
CHLORIDE: 107 meq/L (ref 96–112)
CO2: 28 meq/L (ref 19–32)
Creatinine, Ser: 1.17 mg/dL (ref 0.40–1.20)
GFR: 63.99 mL/min (ref >60.00)
Glucose, Bld: 109 mg/dL — ABNORMAL HIGH (ref 70–99)
Potassium: 4.2 mEq/L (ref 3.5–5.1)
SODIUM: 139 meq/L (ref 135–145)

## 2016-11-28 MED ORDER — FLUCONAZOLE 150 MG PO TABS: 150.0000 mg | ORAL_TABLET | Freq: Once | ORAL | 0 refills | Status: AC

## 2016-11-28 MED ORDER — PROMETHAZINE HCL 25 MG PO TABS: ORAL_TABLET | ORAL | 3 refills | Status: DC

## 2016-11-28 NOTE — Assessment & Plan Note (Signed)
Caused by dehydration following bout of n/v for which she was admitted  Improved at d/c after hydration  She has been drinking fluids and starting to eat a regular diet  Lab today

## 2016-11-28 NOTE — Progress Notes (Signed)
Subjective:    Patient ID: Dana Greer, female    DOB: [birth year], 46 y.o.   MRN: [id number]  HPI Here for f/u of hospitalization from 1/22 to 1/24 2018  Was admitted for n/v She presented with 2-3 days of emesis with some cramping but no diarrhea   Nl labs- incl wbc and lipase  Cr had gone up to 3.05 however from dehydration   They disc THC use /gastroparesis or viral etiology Of note-pt does have a hx of recurrent n/v episodes  Last egd nl   Lots of stressors and anxiety may worsen her n/v condition  Her father had been in the ICU  She gets worse with anxiety in general   ua was checked and urine cx showed multiple species - contamination  Was empirically started on IV Rocephin for poss uti  It gave her a yeast infection - she requests px for diflucan today  BP was elevated at admission  BP Readings from Last 3 Encounters:  11/28/16 126/80  11/23/16 (!) 147/86  10/25/16 120/85   Now back to normal   She rec IV fluid hydration as well as K     Chemistry      Component Value Date/Time   NA 137 11/23/2016 1114   K 3.3 (L) 11/23/2016 1114   CL 107 11/23/2016 1114   CO2 22 11/23/2016 1114   BUN 16 11/23/2016 1114   CREATININE 1.21 (H) 11/23/2016 1114      Component Value Date/Time   CALCIUM 8.7 (L) 11/23/2016 1114   ALKPHOS 51 11/21/2016 2137   AST 36 11/21/2016 2137   ALT 27 11/21/2016 2137   BILITOT 1.3 (H) 11/21/2016 2137      Lab Results  Component Value Date   WBC 12.7 (H) 11/22/2016   HGB 16.0 (H) 11/22/2016   HCT 44.8 11/22/2016   MCV 91.6 11/22/2016   PLT 158 11/22/2016    She is hydrating  Drinking pedialyte/broth/water  Then jello  Started eating some solid foods - first meal just stayed down   Stress is bad at home Father has dementia- this is very hard on her , she cares for him with her mother  Also has kids to care for   She lost brother as a child  Then lost a sister  Now she is the oldest girl in the family   She takes  xanax and paxil   Last saw a counselor 3 years ago  Was not a good match  She has talked to her psychiatrist about that   For a while drank 6-8 coffees per day  She has gotten away from that    Patient Active Problem List   Diagnosis Date Noted  . Yeast vaginitis 11/28/2016  . Abdominal pain 11/22/2016  . AKI (acute kidney injury) ([place]) 11/22/2016  . Acute-on-chronic kidney injury-II 11/22/2016  . Chronic myofascial pain 10/26/2016  . Screening mammogram, encounter for 10/25/2016  . Screening for lipoid disorders 10/20/2016  . HSV-2 infection 08/17/2016  . Renal insufficiency 03/08/2016  . Nausea & vomiting 03/08/2016  . Dehydration 03/08/2016  . Right knee pain 01/27/2016  . Adverse effects of medication 11/25/2015  . Vertigo 10/08/2015  . Pain of right thumb 03/16/2015  . Carpal tunnel syndrome 03/16/2015  . Left hip pain 02/17/2015  . Shoulder pain, left 02/17/2015  . Knee pain, bilateral 01/07/2015  . Gout 12/22/2014  . Visit for routine gyn exam 08/04/2014  . Visit for screening mammogram 08/04/2014  .  Pain, joint, multiple sites 05/26/2014  . Vertiginous migraine 02/03/2014  . Thrombocytopenia ([place]) 10/01/2012  . Intractable migraine 08/31/2012  . Osteoarthritis 05/11/2012  . Female bladder prolapse 05/11/2012  . ADJ DISORDER WITH MIXED ANXIETY & DEPRESSED MOOD 06/30/2008  . HYPERHIDROSIS 01/25/2008  . BACK PAIN, LUMBAR, CHRONIC 10/16/2007  . REACTION, ACUTE STRESS W/EMOTIONAL DSTURB 08/01/2007  . HYPERGLYCEMIA 08/01/2007  . Essential hypertension 08/01/2007  . Goiter 07/11/2007  . POLYCYSTIC OVARIAN DISEASE 05/24/2007  . HIRSUTISM 05/24/2007  . Arthropathy, multiple sites 05/24/2007   Past Medical History:  Diagnosis Date  . Arthritis   . Depression   . Headache(784.0)   . HSV-2 infection   . Immune deficiency disorder ([place])   . Stroke ([place])   . Thrombocytopenia ([place]) 10/01/2012   Past Surgical History:  Procedure Laterality Date  . ABDOMINAL  HYSTERECTOMY    . CESAREAN SECTION  2008   x1  . PARTIAL HYSTERECTOMY  2012   due to fibroid  . UPPER GASTROINTESTINAL ENDOSCOPY    . [place] EXTRACTION  1995   Social History  Substance Use Topics  . Smoking status: Never Smoker  . Smokeless tobacco: Never Used     Comment: Pt states she smokes.....but "not cigarettes"  . Alcohol use 1.2 oz/week    2 Glasses of wine per week     Comment: occ   Family History  Problem Relation Age of Onset  . Diabetes Father   . Breast cancer Paternal Aunt   . Colon cancer Paternal Grandmother    Allergies  Allergen Reactions  . Trazodone And Nefazodone Nausea And Vomiting  . Amoxicillin Other (See Comments)    Yeast infection   . Carafate [Sucralfate] Nausea And Vomiting   Current Outpatient Prescriptions on File Prior to Visit  Medication Sig Dispense Refill  . ALPRAZolam (XANAX) 1 MG tablet Take 0.5 mg by mouth QID.     Marland Kitchen CLONIDINE HCL PO Take 0.2 mg by mouth daily.     Marland Kitchen dicyclomine (BENTYL) 10 MG capsule Take 1 capsule (10 mg total) by mouth daily as needed for spasms.    Marland Kitchen etonogestrel (IMPLANON) 68 MG IMPL implant Inject 1 each into the skin continuous. Reported on 01/27/2016    . fluticasone (FLONASE) 50 MCG/ACT nasal spray Place 2 sprays into both nostrils daily. 16 g 6  . gabapentin (NEURONTIN) 300 MG capsule Take 1 capsule (300 mg total) by mouth 3 (three) times daily. (Patient taking differently: Take 300 mg by mouth 2 (two) times daily. ) 90 capsule 3  . gabapentin (NEURONTIN) 600 MG tablet Take 600 mg by mouth at bedtime.    . MECLIZINE HCL PO Take 1 tablet by mouth at bedtime as needed (for vertigo).     . ondansetron (ZOFRAN ODT) 4 MG disintegrating tablet Take 1 tablet (4 mg total) by mouth every 8 (eight) hours as needed for nausea or vomiting. 12 tablet 1  . PARoxetine (PAXIL) 40 MG tablet Take 1 tablet (40 mg total) by mouth daily. 30 tablet 11  . ranitidine (ZANTAC) 75 MG tablet Take 150 mg by mouth every morning.      Marland Kitchen tetrahydrozoline 0.05 % ophthalmic solution Place 1 drop into both eyes daily as needed (for dry eyes).    . valACYclovir (VALTREX) 500 MG tablet Take 1 tablet (500 mg total) by mouth daily. 30 tablet 5   No current facility-administered medications on file prior to visit.     Review of Systems    Review of  Systems  Constitutional: Negative for fever, appetite change, fatigue and unexpected weight change.  Eyes: Negative for pain and visual disturbance.  Respiratory: Negative for cough and shortness of breath.   Cardiovascular: Negative for cp or palpitations    Gastrointestinal: Negative for  diarrhea and constipation. pos for n/v that is now resolved  Genitourinary: Negative for urgency and frequency. neg for dysuria/ pos for vaginal itching and d/c Skin: Negative for pallor or rash   Neurological: Negative for weakness, light-headedness, numbness and headaches.  Hematological: Negative for adenopathy. Does not bruise/bleed easily.  Psychiatric/Behavioral: pos for depressed and anxious mood that worsens her nausea, pos for stressors .      Objective:   Physical Exam  Constitutional: She appears well-developed and well-nourished. No distress.  obese and well appearing   HENT:  Head: Normocephalic and atraumatic.  Mouth/Throat: Oropharynx is clear and moist.  Eyes: Conjunctivae and EOM are normal. Pupils are equal, round, and reactive to light. No scleral icterus.  Neck: Normal range of motion. Neck supple.  Cardiovascular: Normal rate, regular rhythm and normal heart sounds.   Pulmonary/Chest: Effort normal and breath sounds normal. No respiratory distress. She has no wheezes. She has no rales.  Abdominal: Soft. Bowel sounds are normal. She exhibits no distension and no mass. There is no tenderness. There is no rebound and no guarding.  Lymphadenopathy:    She has no cervical adenopathy.  Neurological: She is alert.  Skin: Skin is warm and dry. No erythema. No pallor.  No  ecchymoses   Psychiatric: Her speech is normal and behavior is normal. Thought content normal. Her mood appears anxious. Thought content is not paranoid. She exhibits a depressed mood. She expresses no homicidal and no suicidal ideation.  Tearful when discussing stressors          Assessment & Plan:   Problem List Items Addressed This Visit      Cardiovascular and Mediastinum   Essential hypertension - Primary    bp in fair control at this time  BP Readings from Last 1 Encounters:  11/28/16 126/80   No changes needed Disc lifstyle change with low sodium diet and exercise        Relevant Orders   Basic metabolic panel (Completed)   CBC with Differential/Platelet (Completed)     Digestive   Nausea & vomiting    Recurrent and recently hosp for this and dehydration causing renal insufficiency Pt thinks her long hx of intermittent n/v may be caused by anxiety from stressors  Has phenergan and zofran for use prn Suggest counseling for mood issues-she is open to this and ref done       Relevant Orders   CBC with Differential/Platelet (Completed)     Genitourinary   AKI (acute kidney injury) ([place])    From recent dehydration  Hospital records/labs /studies rev   Improved at d/c Renal panel and cbc re check today      Renal insufficiency    Caused by dehydration following bout of n/v for which she was admitted  Improved at d/c after hydration  She has been drinking fluids and starting to eat a regular diet  Lab today      Relevant Orders   Basic metabolic panel (Completed)   Yeast vaginitis    Will treat with diflucan       Relevant Medications   fluconazole (DIFLUCAN) 150 MG tablet     Other   ADJ DISORDER WITH MIXED ANXIETY & DEPRESSED MOOD  Sees psychiatry  On paxil currently  Also xanax  Ref for counseling  Pt thinks anxiety is adding to her chronic n/v Suggest avoiding marijuana as well-this was addressed in the hospital  Ref to counselor for  severe stressors       Relevant Orders   Ambulatory referral to Psychology   REACTION, ACUTE STRESS W/EMOTIONAL DSTURB    Pt thinks this may causing her bouts of n/v that have been worked up extensively  Reviewed stressors/ coping techniques/symptoms/ support sources/ tx options and side effects in detail today  Ref to counseling done       Relevant Orders   Ambulatory referral to Psychology   Thrombocytopenia ([place])    Not present during this last admission - with pl ct of 158 No bruising or bleeding

## 2016-11-28 NOTE — Assessment & Plan Note (Signed)
From recent dehydration  Hospital records/labs /studies rev   Improved at d/c Renal panel and cbc re check today

## 2016-11-28 NOTE — Progress Notes (Signed)
Pre visit review using our clinic review tool, if applicable. No additional management support is needed unless otherwise documented below in the visit note. 

## 2016-11-28 NOTE — Assessment & Plan Note (Signed)
Pt thinks this may causing her bouts of n/v that have been worked up extensively  Reviewed stressors/ coping techniques/symptoms/ support sources/ tx options and side effects in detail today  Ref to counseling done

## 2016-11-28 NOTE — Assessment & Plan Note (Signed)
bp in fair control at this time  BP Readings from Last 1 Encounters:  11/28/16 126/80   No changes needed Disc lifstyle change with low sodium diet and exercise

## 2016-11-28 NOTE — Patient Instructions (Signed)
Labs today  Keep hydrating  Advance diet as tolerated   We will refer you to counseling at check out      A good book regarding Alzheimer's is called the 36 Hour Day  A good author regarding mediation and mindfulness is Carleene Mains Zin

## 2016-11-28 NOTE — Assessment & Plan Note (Signed)
Not present during this last admission - with pl ct of 158 No bruising or bleeding

## 2016-11-28 NOTE — Assessment & Plan Note (Signed)
Will treat with diflucan

## 2016-11-28 NOTE — Assessment & Plan Note (Signed)
Recurrent and recently hosp for this and dehydration causing renal insufficiency Pt thinks her long hx of intermittent n/v may be caused by anxiety from stressors  Has phenergan and zofran for use prn Suggest counseling for mood issues-she is open to this and ref done

## 2016-11-28 NOTE — Assessment & Plan Note (Addendum)
Sees psychiatry  On paxil currently  Also xanax  Ref for counseling  Pt thinks anxiety is adding to her chronic n/v Suggest avoiding marijuana as well-this was addressed in the hospital  Ref to counselor for severe stressors

## 2016-12-09 ENCOUNTER — Encounter: Payer: Self-pay | Admitting: Family Medicine

## 2017-02-06 ENCOUNTER — Encounter: Payer: Self-pay | Admitting: Family Medicine

## 2017-02-13 ENCOUNTER — Ambulatory Visit (INDEPENDENT_AMBULATORY_CARE_PROVIDER_SITE_OTHER): Payer: Medicare Other | Admitting: Psychology

## 2017-02-13 DIAGNOSIS — F331 Major depressive disorder, recurrent, moderate: Secondary | ICD-10-CM | POA: Diagnosis not present

## 2017-02-27 ENCOUNTER — Telehealth: Payer: Self-pay

## 2017-02-27 ENCOUNTER — Encounter (HOSPITAL_COMMUNITY): Payer: Self-pay | Admitting: Emergency Medicine

## 2017-02-27 ENCOUNTER — Emergency Department (HOSPITAL_COMMUNITY)
Admission: EM | Admit: 2017-02-27 | Discharge: 2017-02-27 | Disposition: A | Discharge status: Home / Self Care | Payer: Medicare Other | Attending: Emergency Medicine | Admitting: Emergency Medicine

## 2017-02-27 DIAGNOSIS — I10 Essential (primary) hypertension: Secondary | ICD-10-CM | POA: Insufficient documentation

## 2017-02-27 DIAGNOSIS — Z8673 Personal history of transient ischemic attack (TIA), and cerebral infarction without residual deficits: Secondary | ICD-10-CM | POA: Insufficient documentation

## 2017-02-27 DIAGNOSIS — R197 Diarrhea, unspecified: Secondary | ICD-10-CM | POA: Diagnosis not present

## 2017-02-27 DIAGNOSIS — R030 Elevated blood-pressure reading, without diagnosis of hypertension: Secondary | ICD-10-CM | POA: Diagnosis not present

## 2017-02-27 DIAGNOSIS — R112 Nausea with vomiting, unspecified: Secondary | ICD-10-CM

## 2017-02-27 DIAGNOSIS — D696 Thrombocytopenia, unspecified: Secondary | ICD-10-CM

## 2017-02-27 LAB — COMPREHENSIVE METABOLIC PANEL
ALBUMIN: 4.7 g/dL (ref 3.5–5.0)
ALT: 19 U/L (ref 14–54)
AST: 33 U/L (ref 15–41)
Alkaline Phosphatase: 50 U/L (ref 38–126)
Anion gap: 11 (ref 5–15)
BILIRUBIN TOTAL: 0.8 mg/dL (ref 0.3–1.2)
BUN: 11 mg/dL (ref 6–20)
CO2: 24 mmol/L (ref 22–32)
Calcium: 9.4 mg/dL (ref 8.9–10.3)
Chloride: 105 mmol/L (ref 101–111)
Creatinine, Ser: 1.06 mg/dL — ABNORMAL HIGH (ref 0.44–1.00)
GFR calc Af Amer: >60 mL/min (ref >60)
GFR calc non Af Amer: >60 mL/min (ref >60)
GLUCOSE: 172 mg/dL — AB (ref 65–99)
POTASSIUM: 3.5 mmol/L (ref 3.5–5.1)
Sodium: 140 mmol/L (ref 135–145)
TOTAL PROTEIN: 8.2 g/dL — AB (ref 6.5–8.1)

## 2017-02-27 LAB — URINALYSIS, ROUTINE W REFLEX MICROSCOPIC
BILIRUBIN URINE: NEGATIVE
Glucose, UA: 150 mg/dL — AB
KETONES UR: 5 mg/dL — AB
LEUKOCYTES UA: NEGATIVE
Nitrite: NEGATIVE
PROTEIN: NEGATIVE mg/dL
SPECIFIC GRAVITY, URINE: 1.011 (ref 1.005–1.030)
pH: 8 (ref 5.0–8.0)

## 2017-02-27 LAB — CBC WITH DIFFERENTIAL/PLATELET
BASOS ABS: 0 10*3/uL (ref 0.0–0.1)
BASOS PCT: 0 %
Eosinophils Absolute: 0 10*3/uL (ref 0.0–0.7)
Eosinophils Relative: 0 %
HEMATOCRIT: 41.6 % (ref 36.0–46.0)
HEMOGLOBIN: 14.3 g/dL (ref 12.0–15.0)
LYMPHS PCT: 12 %
Lymphs Abs: 0.8 10*3/uL (ref 0.7–4.0)
MCH: 32.9 pg (ref 26.0–34.0)
MCHC: 34.4 g/dL (ref 30.0–36.0)
MCV: 95.9 fL (ref 78.0–100.0)
Monocytes Absolute: 0.2 10*3/uL (ref 0.1–1.0)
Monocytes Relative: 2 %
NEUTROS ABS: 5.8 10*3/uL (ref 1.7–7.7)
NEUTROS PCT: 85 %
Platelets: 48 10*3/uL — ABNORMAL LOW (ref 150–400)
RBC: 4.34 MIL/uL (ref 3.87–5.11)
RDW: 13.7 % (ref 11.5–15.5)
WBC: 6.8 10*3/uL (ref 4.0–10.5)

## 2017-02-27 LAB — LIPASE, BLOOD: Lipase: 21 U/L (ref 11–51)

## 2017-02-27 LAB — PREGNANCY, URINE: PREG TEST UR: NEGATIVE

## 2017-02-27 MED ORDER — PROMETHAZINE HCL 25 MG/ML IJ SOLN
25.0000 mg | Freq: Once | INTRAMUSCULAR | Status: AC
  Administered 2017-02-27: 25 mg via INTRAVENOUS
  Filled 2017-02-27: qty 1

## 2017-02-27 MED ORDER — CLONIDINE HCL 0.1 MG PO TABS
0.2000 mg | ORAL_TABLET | Freq: Once | ORAL | Status: AC
  Administered 2017-02-27: 0.2 mg via ORAL
  Filled 2017-02-27: qty 2

## 2017-02-27 MED ORDER — ONDANSETRON 4 MG PO TBDP: 4.0000 mg | ORAL_TABLET | Freq: Three times a day (TID) | ORAL | 0 refills | Status: DC | PRN

## 2017-02-27 MED ORDER — FAMOTIDINE IN NACL 20-0.9 MG/50ML-% IV SOLN
20.0000 mg | Freq: Once | INTRAVENOUS | Status: AC
  Administered 2017-02-27: 20 mg via INTRAVENOUS
  Filled 2017-02-27: qty 50

## 2017-02-27 MED ORDER — ONDANSETRON HCL 4 MG/2ML IJ SOLN
4.0000 mg | Freq: Once | INTRAMUSCULAR | Status: AC
  Administered 2017-02-27: 4 mg via INTRAVENOUS
  Filled 2017-02-27: qty 2

## 2017-02-27 MED ORDER — SODIUM CHLORIDE 0.9 % IV BOLUS (SEPSIS)
1000.0000 mL | Freq: Once | INTRAVENOUS | Status: AC
  Administered 2017-02-27: 1000 mL via INTRAVENOUS

## 2017-02-27 NOTE — Discharge Instructions (Signed)
Follow-up for a repeat platelet count test in 1 week. Take the nausea medication as prescribed. Stop using marijuana. Return to the ED if you develop new or worsening symptoms.

## 2017-02-27 NOTE — ED Provider Notes (Signed)
[facility] DEPT Provider Note   CSN: [id number] Arrival date & time: 02/27/17  0350     History   Chief Complaint Chief Complaint  Patient presents with  . Emesis  . Nausea  . Diarrhea    HPI Dana Greer is a 46 y.o. female.  Patient arrives by EMS with nausea vomiting and diarrhea that started around 10 PM. States she's vomited countless times that has been clear and nonbloody. Also had several episodes of diarrhea. Started after eating fried fish for dinner. No one else has been sick. Denies fever. Denies dysuria hematuria. Patient with nausea vomiting issues in the past that have been attributed to stress and possible THC use. Does admit to using marijuana yesterday. Denies any abdominal pain. Denies any chest pain or shortness of breath. Denies any fever or chills. She is hypertensive and has not been able to take her BP meds today.   The history is provided by the patient and the EMS personnel.  Emesis   Associated symptoms include diarrhea. Pertinent negatives include no abdominal pain, no arthralgias, no cough, no fever, no headaches and no myalgias.  Diarrhea   Associated symptoms include vomiting. Pertinent negatives include no abdominal pain, no headaches, no arthralgias, no myalgias and no cough.    Past Medical History:  Diagnosis Date  . Arthritis   . Depression   . Headache(784.0)   . HSV-2 infection   . Immune deficiency disorder ([place])   . Stroke ([place])   . Thrombocytopenia ([place]) 10/01/2012    Patient Active Problem List   Diagnosis Date Noted  . Yeast vaginitis 11/28/2016  . Abdominal pain 11/22/2016  . AKI (acute kidney injury) ([place]) 11/22/2016  . Acute-on-chronic kidney injury-II 11/22/2016  . Chronic myofascial pain 10/26/2016  . Screening mammogram, encounter for 10/25/2016  . Screening for lipoid disorders 10/20/2016  . HSV-2 infection 08/17/2016  . Renal insufficiency 03/08/2016  . Nausea & vomiting 03/08/2016  . Dehydration  03/08/2016  . Right knee pain 01/27/2016  . Adverse effects of medication 11/25/2015  . Vertigo 10/08/2015  . Pain of right thumb 03/16/2015  . Carpal tunnel syndrome 03/16/2015  . Left hip pain 02/17/2015  . Shoulder pain, left 02/17/2015  . Knee pain, bilateral 01/07/2015  . Gout 12/22/2014  . Visit for routine gyn exam 08/04/2014  . Visit for screening mammogram 08/04/2014  . Pain, joint, multiple sites 05/26/2014  . Vertiginous migraine 02/03/2014  . Thrombocytopenia ([place]) 10/01/2012  . Intractable migraine 08/31/2012  . Osteoarthritis 05/11/2012  . Female bladder prolapse 05/11/2012  . ADJ DISORDER WITH MIXED ANXIETY & DEPRESSED MOOD 06/30/2008  . HYPERHIDROSIS 01/25/2008  . BACK PAIN, LUMBAR, CHRONIC 10/16/2007  . REACTION, ACUTE STRESS W/EMOTIONAL DSTURB 08/01/2007  . HYPERGLYCEMIA 08/01/2007  . Essential hypertension 08/01/2007  . Goiter 07/11/2007  . POLYCYSTIC OVARIAN DISEASE 05/24/2007  . HIRSUTISM 05/24/2007  . Arthropathy, multiple sites 05/24/2007    Past Surgical History:  Procedure Laterality Date  . ABDOMINAL HYSTERECTOMY    . CESAREAN SECTION  2008   x1  . PARTIAL HYSTERECTOMY  2012   due to fibroid  . UPPER GASTROINTESTINAL ENDOSCOPY    . WISDOM TOOTH EXTRACTION  1995    OB History    Gravida Para Term Preterm AB Living   3         3   SAB TAB Ectopic Multiple Live Births                  Obstetric Comments  Pt states that she has had a lot of miscarriages. Do not know how many       Home Medications    Prior to Admission medications   Medication Sig Start Date End Date Taking? Authorizing Provider  ALPRAZolam Duanne Moron) 1 MG tablet Take 0.5 mg by mouth QID.     Historical Provider, MD  CLONIDINE HCL PO Take 0.2 mg by mouth daily.     Historical Provider, MD  dicyclomine (BENTYL) 10 MG capsule Take 1 capsule (10 mg total) by mouth daily as needed for spasms. 11/23/16   Modena Jansky, MD  etonogestrel (IMPLANON) 68 MG IMPL implant Inject 1  each into the skin continuous. Reported on 01/27/2016    Historical Provider, MD  fluticasone (FLONASE) 50 MCG/ACT nasal spray Place 2 sprays into both nostrils daily. 04/07/14   Abner Greenspan, MD  gabapentin (NEURONTIN) 300 MG capsule Take 1 capsule (300 mg total) by mouth 3 (three) times daily. Patient taking differently: Take 300 mg by mouth 2 (two) times daily.  11/25/15   Abner Greenspan, MD  gabapentin (NEURONTIN) 600 MG tablet Take 600 mg by mouth at bedtime.    Historical Provider, MD  MECLIZINE HCL PO Take 1 tablet by mouth at bedtime as needed (for vertigo).     Historical Provider, MD  ondansetron (ZOFRAN ODT) 4 MG disintegrating tablet Take 1 tablet (4 mg total) by mouth every 8 (eight) hours as needed for nausea or vomiting. 11/15/16   Abner Greenspan, MD  PARoxetine (PAXIL) 40 MG tablet Take 1 tablet (40 mg total) by mouth daily. 05/26/14   Abner Greenspan, MD  promethazine (PHENERGAN) 25 MG tablet TAKE ONE TABLET BY MOUTH EVERY EIGHT HOURS AS NEEDED FOR NAUSEA OR VOMITING 11/28/16   Abner Greenspan, MD  ranitidine (ZANTAC) 75 MG tablet Take 150 mg by mouth every morning.     Historical Provider, MD  tetrahydrozoline 0.05 % ophthalmic solution Place 1 drop into both eyes daily as needed (for dry eyes).    Historical Provider, MD  valACYclovir (VALTREX) 500 MG tablet Take 1 tablet (500 mg total) by mouth daily. 11/15/16   Abner Greenspan, MD    Family History Family History  Problem Relation Age of Onset  . Diabetes Father   . Breast cancer Paternal Aunt   . Colon cancer Paternal Grandmother     Social History Social History  Substance Use Topics  . Smoking status: Never Smoker  . Smokeless tobacco: Never Used     Comment: Pt states she smokes.....but "not cigarettes"  . Alcohol use 1.2 oz/week    2 Glasses of wine per week     Comment: occ     Allergies   Trazodone and nefazodone; Amoxicillin; and Carafate [sucralfate]   Review of Systems Review of Systems  Constitutional:  Positive for activity change and appetite change. Negative for fatigue and fever.  HENT: Negative for congestion and rhinorrhea.   Respiratory: Negative for cough, chest tightness and shortness of breath.   Gastrointestinal: Positive for diarrhea, nausea and vomiting. Negative for abdominal pain.  Genitourinary: Negative for dysuria, hematuria, vaginal bleeding and vaginal discharge.  Musculoskeletal: Negative for arthralgias and myalgias.  Skin: Negative for wound.  Neurological: Negative for dizziness, weakness and headaches.    all other systems are negative except as noted in the HPI and PMH.    Physical Exam Updated Vital Signs BP (!) 213/122 (BP Location: Left Arm)   Pulse 65   Temp  97.5 F (36.4 C) (Oral)   Resp 18   SpO2 100%   Physical Exam  Constitutional: She is oriented to person, place, and time. She appears well-developed and well-nourished. No distress.  HENT:  Head: Normocephalic and atraumatic.  Mouth/Throat: Oropharynx is clear and moist. No oropharyngeal exudate.  Moist mucus membranes  Eyes: Conjunctivae and EOM are normal. Pupils are equal, round, and reactive to light.  Neck: Normal range of motion. Neck supple.  No meningismus.  Cardiovascular: Normal rate, regular rhythm, normal heart sounds and intact distal pulses.   No murmur heard. Pulmonary/Chest: Effort normal and breath sounds normal. No respiratory distress.  Abdominal: Soft. There is no tenderness. There is no rebound and no guarding.  Musculoskeletal: Normal range of motion. She exhibits no edema or tenderness.  Neurological: She is alert and oriented to person, place, and time. No cranial nerve deficit. She exhibits normal muscle tone. Coordination normal.   5/5 strength throughout. CN 2-12 intact.Equal grip strength.   Skin: Skin is warm.  Psychiatric: She has a normal mood and affect. Her behavior is normal.  Nursing note and vitals reviewed.    ED Treatments / Results  Labs (all labs  ordered are listed, but only abnormal results are displayed) Labs Reviewed  CBC WITH DIFFERENTIAL/PLATELET - Abnormal; Notable for the following:       Result Value   Platelets 48 (*)    All other components within normal limits  COMPREHENSIVE METABOLIC PANEL - Abnormal; Notable for the following:    Glucose, Bld 172 (*)    Creatinine, Ser 1.06 (*)    Total Protein 8.2 (*)    All other components within normal limits  URINALYSIS, ROUTINE W REFLEX MICROSCOPIC - Abnormal; Notable for the following:    Color, Urine STRAW (*)    Glucose, UA 150 (*)    Hgb urine dipstick SMALL (*)    Ketones, ur 5 (*)    Bacteria, UA RARE (*)    Squamous Epithelial / LPF 0-5 (*)    All other components within normal limits  LIPASE, BLOOD  PREGNANCY, URINE    EKG  EKG Interpretation None       Radiology No results found.  Procedures Procedures (including critical care time)  Medications Ordered in ED Medications  sodium chloride 0.9 % bolus 1,000 mL (not administered)  ondansetron (ZOFRAN) injection 4 mg (not administered)  famotidine (PEPCID) IVPB 20 mg premix (not administered)     Initial Impression / Assessment and Plan / ED Course  I have reviewed the triage vital signs and the nursing notes.  Pertinent labs & imaging results that were available during my care of the patient were reviewed by me and considered in my medical decision making (see chart for details).     Patient with nausea vomiting and diarrhea that onset last night. Unable take blood pressure medication. Denies abdominal pain. Previous episodes attributed to anxiety as well as marijuana abuse.  Abdomen is soft. No peritoneal signs.  Labs are reassuring. Incidental thrombocytopenia 48. Patient reports she has been told she is of low platelets in the past but does remember what was done about them. Denies any new medications. Denies any bleeding issues.  D/w Dr. Alen Blew. He feels patient's thrombocytopenia may be  due to her acute illness. Does not recommend prednisone at this time. ITP is considered but she is a not at risk for bleeding. He recommends recheck of CBC in one week.  No further vomiting in the ED. She  is tolerating by mouth. Blood pressure has improved to 179/98 and she was able to tolerate her home clonidine. Advised to stop using marijuana. Follow-up with PCP in one week for repeat CBC. Return precautions discussed. Final Clinical Impressions(s) / ED Diagnoses   Final diagnoses:  Nausea vomiting and diarrhea  Thrombocytopenia ([place])    New Prescriptions New Prescriptions   No medications on file     Ezequiel Essex, MD 02/27/17 [phone]

## 2017-02-27 NOTE — ED Notes (Signed)
Bed: HK25 Expected date:  Expected time:  Means of arrival:  Comments: EMS nausea and vomiting

## 2017-02-27 NOTE — Telephone Encounter (Signed)
PLEASE NOTE: All timestamps contained within this report are represented as [place] Standard Time. CONFIDENTIALTY NOTICE: This fax transmission is intended only for the addressee. It contains information that is legally privileged, confidential or otherwise protected from use or disclosure. If you are not the intended recipient, you are strictly prohibited from reviewing, disclosing, copying using or disseminating any of this information or taking any action in reliance on or regarding this information. If you have received this fax in error, please notify us immediately by telephone so that we can arrange for its return to [place]. Phone: [phone], Toll-Free: [phone], Fax: [phone] Page: 1 of 1 Call Id: [id number] [place] Patient Name: Dana Greer Gender: Female DOB: 03-02-1971 Age: 47 Y 83 M 11 D Return Phone Number: [phone] (Primary), [phone] (Secondary) City/State/Zip: [place] Client [place] Night - Client Client Site Council Physician [name], Roque Lias - MD Who Is Calling Patient / Member / Family / Caregiver Call Type Triage / Clinical Relationship To Patient Self Return Phone Number [phone] (Primary) Chief Complaint Vomiting Reason for Call Symptomatic / Request for [place] is having severe vomiting . Nurse Assessment Guidelines Guideline Title Affirmed Question Disp. Time [name] Time) Disposition Final User 02/27/2017 1:53:41 AM FINAL ATTEMPT MADE - message left Yes Larita Fife Comments User: Larita Fife Date/Time (Eastern Time): 02/27/2017 1:51:55 AM Unable to leave message on secondary number, went to "fast busy signal".

## 2017-02-27 NOTE — ED Triage Notes (Signed)
Patient arrives by EMS with complaints of nausea,vomiting,diarrhea x 5 hours.  After eating a fried fish dinner, became nauseated-vomiting and diarrhea. Hypertension due to unable to take meds this evening.

## 2017-02-27 NOTE — Telephone Encounter (Signed)
Per chart review tab pt seen at [facility] ED 02/27/17.

## 2017-03-03 ENCOUNTER — Ambulatory Visit (INDEPENDENT_AMBULATORY_CARE_PROVIDER_SITE_OTHER): Payer: Medicare Other | Admitting: Family Medicine

## 2017-03-03 ENCOUNTER — Encounter: Payer: Self-pay | Admitting: Family Medicine

## 2017-03-03 VITALS — BP 122/68 | HR 75 | Temp 99.2°F | Ht 62.5 in | Wt 203.2 lb

## 2017-03-03 DIAGNOSIS — R7309 Other abnormal glucose: Secondary | ICD-10-CM | POA: Diagnosis not present

## 2017-03-03 DIAGNOSIS — F4323 Adjustment disorder with mixed anxiety and depressed mood: Secondary | ICD-10-CM | POA: Diagnosis not present

## 2017-03-03 DIAGNOSIS — R112 Nausea with vomiting, unspecified: Secondary | ICD-10-CM

## 2017-03-03 DIAGNOSIS — I10 Essential (primary) hypertension: Secondary | ICD-10-CM | POA: Diagnosis not present

## 2017-03-03 DIAGNOSIS — M7918 Myalgia, other site: Secondary | ICD-10-CM

## 2017-03-03 DIAGNOSIS — D696 Thrombocytopenia, unspecified: Secondary | ICD-10-CM

## 2017-03-03 DIAGNOSIS — G8929 Other chronic pain: Secondary | ICD-10-CM

## 2017-03-03 DIAGNOSIS — M791 Myalgia: Secondary | ICD-10-CM | POA: Diagnosis not present

## 2017-03-03 LAB — CBC WITH DIFFERENTIAL/PLATELET
BASOS ABS: 0 10*3/uL (ref 0.0–0.1)
Basophils Relative: 0.2 % (ref 0.0–3.0)
EOS ABS: 0.1 10*3/uL (ref 0.0–0.7)
EOS PCT: 2.2 % (ref 0.0–5.0)
HCT: 41.4 % (ref 36.0–46.0)
HEMOGLOBIN: 13.7 g/dL (ref 12.0–15.0)
Lymphocytes Relative: 45.7 % (ref 12.0–46.0)
Lymphs Abs: 2.6 10*3/uL (ref 0.7–4.0)
MCHC: 33.2 g/dL (ref 30.0–36.0)
MCV: 99.3 fl (ref 78.0–100.0)
MONO ABS: 0.3 10*3/uL (ref 0.1–1.0)
Monocytes Relative: 5.8 % (ref 3.0–12.0)
Neutro Abs: 2.7 10*3/uL (ref 1.4–7.7)
Neutrophils Relative %: 46.1 % (ref 43.0–77.0)
Platelets: 139 10*3/uL — ABNORMAL LOW (ref 150.0–400.0)
RBC: 4.16 Mil/uL (ref 3.87–5.11)
RDW: 14.5 % (ref 11.5–15.5)
WBC: 5.8 10*3/uL (ref 4.0–10.5)

## 2017-03-03 LAB — HEMOGLOBIN A1C: Hgb A1c MFr Bld: 5.6 % (ref 4.6–6.5)

## 2017-03-03 MED ORDER — GABAPENTIN 300 MG PO CAPS: 300.0000 mg | ORAL_CAPSULE | Freq: Three times a day (TID) | ORAL | 11 refills | Status: DC

## 2017-03-03 NOTE — Progress Notes (Signed)
Subjective:    Patient ID: Dana Greer, female    DOB: 08-Jul-1971, 46 y.o.   MRN: [id number]  HPI Here for f/u of ED visit on 4/30  She was seen for n/v d that started after eating fried fish for dinner   She has had extensive w/u for n/v in the past At one time thought to be due to stress She does have hx of THC use   Results for orders placed or performed during the hospital encounter of 02/27/17  CBC with Differential/Platelet  Result Value Ref Range   WBC 6.8 4.0 - 10.5 K/uL   RBC 4.34 3.87 - 5.11 MIL/uL   Hemoglobin 14.3 12.0 - 15.0 g/dL   HCT 41.6 36.0 - 46.0 %   MCV 95.9 78.0 - 100.0 fL   MCH 32.9 26.0 - 34.0 pg   MCHC 34.4 30.0 - 36.0 g/dL   RDW 13.7 11.5 - 15.5 %   Platelets 48 (L) 150 - 400 K/uL   Neutrophils Relative % 85 %   Neutro Abs 5.8 1.7 - 7.7 K/uL   Lymphocytes Relative 12 %   Lymphs Abs 0.8 0.7 - 4.0 K/uL   Monocytes Relative 2 %   Monocytes Absolute 0.2 0.1 - 1.0 K/uL   Eosinophils Relative 0 %   Eosinophils Absolute 0.0 0.0 - 0.7 K/uL   Basophils Relative 0 %   Basophils Absolute 0.0 0.0 - 0.1 K/uL  Comprehensive metabolic panel  Result Value Ref Range   Sodium 140 135 - 145 mmol/L   Potassium 3.5 3.5 - 5.1 mmol/L   Chloride 105 101 - 111 mmol/L   CO2 24 22 - 32 mmol/L   Glucose, Bld 172 (H) 65 - 99 mg/dL   BUN 11 6 - 20 mg/dL   Creatinine, Ser 1.06 (H) 0.44 - 1.00 mg/dL   Calcium 9.4 8.9 - 10.3 mg/dL   Total Protein 8.2 (H) 6.5 - 8.1 g/dL   Albumin 4.7 3.5 - 5.0 g/dL   AST 33 15 - 41 U/L   ALT 19 14 - 54 U/L   Alkaline Phosphatase 50 38 - 126 U/L   Total Bilirubin 0.8 0.3 - 1.2 mg/dL   GFR calc non Af Amer >60 >60 mL/min   GFR calc Af Amer >60 >60 mL/min   Anion gap 11 5 - 15  Lipase, blood  Result Value Ref Range   Lipase 21 11 - 51 U/L  Urinalysis, Routine w reflex microscopic  Result Value Ref Range   Color, Urine STRAW (A) YELLOW   APPearance CLEAR CLEAR   Specific Gravity, Urine 1.011 1.005 - 1.030   pH 8.0 5.0 - 8.0    Glucose, UA 150 (A) NEGATIVE mg/dL   Hgb urine dipstick SMALL (A) NEGATIVE   Bilirubin Urine NEGATIVE NEGATIVE   Ketones, ur 5 (A) NEGATIVE mg/dL   Protein, ur NEGATIVE NEGATIVE mg/dL   Nitrite NEGATIVE NEGATIVE   Leukocytes, UA NEGATIVE NEGATIVE   RBC / HPF 6-30 0 - 5 RBC/hpf   WBC, UA 0-5 0 - 5 WBC/hpf   Bacteria, UA RARE (A) NONE SEEN   Squamous Epithelial / LPF 0-5 (A) NONE SEEN   Mucous PRESENT   Pregnancy, urine  Result Value Ref Range   Preg Test, Ur NEGATIVE NEGATIVE    Reassuring labs  EKG re assuring   Low platelet ct of 48 thought to be due to poss viral illness   bp was quite high in ED-now back to normal  BP Readings from Last 3 Encounters:  03/03/17 122/68  02/27/17 (!) 195/119  11/28/16 126/80   c/o all over body pain  She takes gabapentin for myofasical pain  Feet/ankles and back really hurt (seems to be getting worse instead of better)   At this time she feels quite a bit better Thinks she took her medicine for nausea too late and the vomiting started  Cramping and diarrhea come and go  Stress- is bad- father in hospice   Taking gabapentin 300 am and lunch and 600 at night  Not a lot of sedation or side effects Does help her sleep   Last marijuana was 2 d prior to her episode  Not using excessively    Is seeing Bambi Cottle for counseling  Going well  Thinks her medicines are ok  She notes she cannot skip the xanax - (gets very nervous)    Patient Active Problem List   Diagnosis Date Noted  . Yeast vaginitis 11/28/2016  . Abdominal pain 11/22/2016  . AKI (acute kidney injury) ([place]) 11/22/2016  . Acute-on-chronic kidney injury-II 11/22/2016  . Chronic myofascial pain 10/26/2016  . Screening mammogram, encounter for 10/25/2016  . Screening for lipoid disorders 10/20/2016  . HSV-2 infection 08/17/2016  . Renal insufficiency 03/08/2016  . Nausea & vomiting 03/08/2016  . Dehydration 03/08/2016  . Right knee pain 01/27/2016  . Adverse  effects of medication 11/25/2015  . Vertigo 10/08/2015  . Pain of right thumb 03/16/2015  . Carpal tunnel syndrome 03/16/2015  . Left hip pain 02/17/2015  . Shoulder pain, left 02/17/2015  . Knee pain, bilateral 01/07/2015  . Gout 12/22/2014  . Visit for routine gyn exam 08/04/2014  . Visit for screening mammogram 08/04/2014  . Pain, joint, multiple sites 05/26/2014  . Vertiginous migraine 02/03/2014  . Thrombocytopenia ([place]) 10/01/2012  . Intractable migraine 08/31/2012  . Osteoarthritis 05/11/2012  . Female bladder prolapse 05/11/2012  . ADJ DISORDER WITH MIXED ANXIETY & DEPRESSED MOOD 06/30/2008  . HYPERHIDROSIS 01/25/2008  . BACK PAIN, LUMBAR, CHRONIC 10/16/2007  . REACTION, ACUTE STRESS W/EMOTIONAL DSTURB 08/01/2007  . HYPERGLYCEMIA 08/01/2007  . Essential hypertension 08/01/2007  . Goiter 07/11/2007  . POLYCYSTIC OVARIAN DISEASE 05/24/2007  . HIRSUTISM 05/24/2007  . Arthropathy, multiple sites 05/24/2007   Past Medical History:  Diagnosis Date  . Arthritis   . Depression   . Headache(784.0)   . HSV-2 infection   . Immune deficiency disorder ([place])   . Stroke ([place])   . Thrombocytopenia ([place]) 10/01/2012   Past Surgical History:  Procedure Laterality Date  . ABDOMINAL HYSTERECTOMY    . CESAREAN SECTION  2008   x1  . PARTIAL HYSTERECTOMY  2012   due to fibroid  . UPPER GASTROINTESTINAL ENDOSCOPY    . [place] EXTRACTION  1995   Social History  Substance Use Topics  . Smoking status: Never Smoker  . Smokeless tobacco: Never Used     Comment: Pt states she smokes.....but "not cigarettes"  . Alcohol use 1.2 oz/week    2 Glasses of wine per week     Comment: occ   Family History  Problem Relation Age of Onset  . Diabetes Father   . Breast cancer Paternal Aunt   . Colon cancer Paternal Grandmother    Allergies  Allergen Reactions  . Trazodone And Nefazodone Nausea And Vomiting  . Amoxicillin Other (See Comments)    Yeast infection   . Carafate  [Sucralfate] Nausea And Vomiting   Current Outpatient Prescriptions on File  Prior to Visit  Medication Sig Dispense Refill  . ALPRAZolam (XANAX) 1 MG tablet Take 1 mg by mouth QID.     . cloNIDine (CATAPRES) 0.2 MG tablet Take 0.2 mg by mouth daily.    Marland Kitchen dicyclomine (BENTYL) 10 MG capsule Take 1 capsule (10 mg total) by mouth daily as needed for spasms.    Marland Kitchen etonogestrel (IMPLANON) 68 MG IMPL implant Inject 1 each into the skin continuous. Reported on 01/27/2016    . fluticasone (FLONASE) 50 MCG/ACT nasal spray Place 2 sprays into both nostrils daily. (Patient taking differently: Place 2 sprays into both nostrils daily as needed for allergies. ) 16 g 6  . meclizine (ANTIVERT) 25 MG tablet Take 25 mg by mouth 3 (three) times daily as needed for dizziness.    . ondansetron (ZOFRAN ODT) 4 MG disintegrating tablet Take 1 tablet (4 mg total) by mouth every 8 (eight) hours as needed for nausea or vomiting. 20 tablet 0  . PARoxetine (PAXIL) 40 MG tablet Take 1 tablet (40 mg total) by mouth daily. 30 tablet 11  . promethazine (PHENERGAN) 25 MG tablet TAKE ONE TABLET BY MOUTH EVERY EIGHT HOURS AS NEEDED FOR NAUSEA OR VOMITING (Patient taking differently: Take 25 mg by mouth every 8 (eight) hours as needed for nausea or vomiting. ) 30 tablet 3  . ranitidine (ZANTAC) 75 MG tablet Take 150 mg by mouth every morning.     Marland Kitchen tetrahydrozoline 0.05 % ophthalmic solution Place 1 drop into both eyes daily as needed (for dry eyes).    . valACYclovir (VALTREX) 500 MG tablet Take 1 tablet (500 mg total) by mouth daily. 30 tablet 5   No current facility-administered medications on file prior to visit.     Review of Systems    Review of Systems  Constitutional: Negative for fever, appetite change, fatigue and unexpected weight change.  Eyes: Negative for pain and visual disturbance.  Respiratory: Negative for cough and shortness of breath.   Cardiovascular: Negative for cp or palpitations    Gastrointestinal:  Negative for nausea, diarrhea and constipation. (nausea is revolved) neg for abd pain or blood in stool/dark stool  Genitourinary: Negative for urgency and frequency.  Skin: Negative for pallor or rash   MSK pos for general myofasical pain and some joint pain without swelling  Neurological: Negative for weakness, light-headedness, numbness and headaches.  Hematological: Negative for adenopathy. Does not bruise/bleed easily.  Psychiatric/Behavioral: pos for symptoms of depression and anxiety that are improving .      Objective:   Physical Exam  Constitutional: She appears well-developed and well-nourished. No distress.  obese and well appearing   HENT:  Head: Normocephalic and atraumatic.  Mouth/Throat: Oropharynx is clear and moist.  Eyes: Conjunctivae and EOM are normal. Pupils are equal, round, and reactive to light. No scleral icterus.  Neck: Normal range of motion. Neck supple. No JVD present. Carotid bruit is not present. No thyromegaly present.  Cardiovascular: Normal rate, regular rhythm, normal heart sounds and intact distal pulses.  Exam reveals no gallop.   Pulmonary/Chest: Effort normal and breath sounds normal. No respiratory distress. She has no wheezes. She has no rales.  No crackles  Abdominal: Soft. Bowel sounds are normal. She exhibits no distension, no abdominal bruit and no mass. There is no tenderness. There is no rebound and no guarding.  Musculoskeletal: She exhibits no edema.  Lymphadenopathy:    She has no cervical adenopathy.  Neurological: She is alert. She has normal reflexes.  Skin:  Skin is warm and dry. No rash noted. No erythema. No pallor.  No excessive bruising   Psychiatric: Her speech is normal and behavior is normal. Thought content normal. Her mood appears anxious. Her affect is not blunt, not labile and not inappropriate. She does not exhibit a depressed mood.  Pleasant affect           Assessment & Plan:   Problem List Items Addressed This  Visit      Cardiovascular and Mediastinum   Essential hypertension - Primary    bp in fair control at this time  BP Readings from Last 1 Encounters:  03/03/17 122/68   No changes needed Disc lifstyle change with low sodium diet and exercise  (bp was very elevated in ED-back to normal now)        Digestive   Nausea & vomiting    Recurrent  Tends to happen with inc in stressors  Neg w/u in the past  Disc poss food intol or allergy (? Beef or seafood)-she will keep a food diary  Reviewed hospital records, lab results and studies in detail  Much improved now-has zofran and phenergan to use prn  Continue mental health tx and counseling  No s/s of dehydration and labs were re assuring Did counsel on marijuana use and the role this can also play in cyclic vomiting         Other   ADJ DISORDER WITH MIXED ANXIETY & DEPRESSED MOOD    Continues psychiatric care -some imp  Will continue counseling- helpful as well  Suspect stressors tend to flare her GI and MSK complaints  She agrees  Reviewed stressors/ coping techniques/symptoms/ support sources/ tx options and side effects in detail today       Chronic myofascial pain    Worse lately- poss set off by recent stress and exac of GI condition  Recommend low impact exercise and stretching as tolerated Continue counseling and psychiatric care  Consider inc gabapentin to 600 mg tid -disc poss side eff (will update)        HYPERGLYCEMIA    Glucose elevated in ED/non fasting  a1C today  disc imp of low glycemic diet and wt loss to prevent DM2       Relevant Orders   Hemoglobin A1c (Completed)   Thrombocytopenia (HCC)    Platelet ct of 48 in the ED  ? If reactive  No bleeding/bruising or hx of clots  This has happened before in setting of a vomiting spell Reviewed hospital records, lab results and studies in detail   Re check today -feeling better        Relevant Orders   CBC with Differential/Platelet (Completed)

## 2017-03-03 NOTE — Assessment & Plan Note (Signed)
Platelet ct of 48 in the ED  ? If reactive  No bleeding/bruising or hx of clots  This has happened before in setting of a vomiting spell Reviewed hospital records, lab results and studies in detail   Re check today -feeling better

## 2017-03-03 NOTE — Assessment & Plan Note (Signed)
Recurrent  Tends to happen with inc in stressors  Neg w/u in the past  Disc poss food intol or allergy (? Beef or seafood)-she will keep a food diary  Reviewed hospital records, lab results and studies in detail  Much improved now-has zofran and phenergan to use prn  Continue mental health tx and counseling  No s/s of dehydration and labs were re assuring Did counsel on marijuana use and the role this can also play in cyclic vomiting

## 2017-03-03 NOTE — Assessment & Plan Note (Signed)
bp in fair control at this time  BP Readings from Last 1 Encounters:  03/03/17 122/68   No changes needed Disc lifstyle change with low sodium diet and exercise  (bp was very elevated in ED-back to normal now)

## 2017-03-03 NOTE — Patient Instructions (Addendum)
On bad days you can push the gabapentin to 600 three times per day  Keep working towards low impact exercise  If you take an anti inflammatory- take with food and stop it if hurts your stomach Re check platelets today   Take care of yourself Continue counseling   Keep a food diary - you may have food intolerances or allergies  ? Does seafood make you sick How about beef?  In general fried foods can also cause problems  Continue to avoid dairy or use lactaid

## 2017-03-03 NOTE — Progress Notes (Signed)
Pre visit review using our clinic review tool, if applicable. No additional management support is needed unless otherwise documented below in the visit note. 

## 2017-03-04 NOTE — Assessment & Plan Note (Signed)
Continues psychiatric care -some imp  Will continue counseling- helpful as well  Suspect stressors tend to flare her GI and MSK complaints  She agrees  Reviewed stressors/ coping techniques/symptoms/ support sources/ tx options and side effects in detail today

## 2017-03-04 NOTE — Assessment & Plan Note (Signed)
Glucose elevated in ED/non fasting  a1C today  disc imp of low glycemic diet and wt loss to prevent DM2

## 2017-03-04 NOTE — Assessment & Plan Note (Signed)
Worse lately- poss set off by recent stress and exac of GI condition  Recommend low impact exercise and stretching as tolerated Continue counseling and psychiatric care  Consider inc gabapentin to 600 mg tid -disc poss side eff (will update)

## 2017-03-10 ENCOUNTER — Ambulatory Visit (INDEPENDENT_AMBULATORY_CARE_PROVIDER_SITE_OTHER): Payer: Medicare Other | Admitting: Psychology

## 2017-03-10 DIAGNOSIS — F331 Major depressive disorder, recurrent, moderate: Secondary | ICD-10-CM | POA: Diagnosis not present

## 2017-03-12 ENCOUNTER — Encounter: Payer: Self-pay | Admitting: Family Medicine

## 2017-03-20 ENCOUNTER — Ambulatory Visit (INDEPENDENT_AMBULATORY_CARE_PROVIDER_SITE_OTHER): Payer: Medicare Other | Admitting: Psychology

## 2017-03-20 DIAGNOSIS — F331 Major depressive disorder, recurrent, moderate: Secondary | ICD-10-CM

## 2017-03-28 ENCOUNTER — Encounter: Payer: Self-pay | Admitting: Family Medicine

## 2017-03-28 ENCOUNTER — Other Ambulatory Visit: Payer: Self-pay | Admitting: Family Medicine

## 2017-04-03 ENCOUNTER — Ambulatory Visit (INDEPENDENT_AMBULATORY_CARE_PROVIDER_SITE_OTHER): Payer: Medicare Other | Admitting: Psychology

## 2017-04-03 DIAGNOSIS — F331 Major depressive disorder, recurrent, moderate: Secondary | ICD-10-CM | POA: Diagnosis not present

## 2017-04-17 ENCOUNTER — Ambulatory Visit (INDEPENDENT_AMBULATORY_CARE_PROVIDER_SITE_OTHER): Payer: Medicare Other | Admitting: Psychology

## 2017-04-17 ENCOUNTER — Encounter: Payer: Self-pay | Admitting: Family Medicine

## 2017-04-17 DIAGNOSIS — F331 Major depressive disorder, recurrent, moderate: Secondary | ICD-10-CM | POA: Diagnosis not present

## 2017-04-18 MED ORDER — PROMETHAZINE HCL 25 MG PO TABS: ORAL_TABLET | ORAL | 3 refills | Status: DC

## 2017-05-01 ENCOUNTER — Ambulatory Visit (INDEPENDENT_AMBULATORY_CARE_PROVIDER_SITE_OTHER): Payer: Medicare Other | Admitting: Psychology

## 2017-05-01 DIAGNOSIS — F331 Major depressive disorder, recurrent, moderate: Secondary | ICD-10-CM | POA: Diagnosis not present

## 2017-05-05 ENCOUNTER — Encounter: Payer: Self-pay | Admitting: Family Medicine

## 2017-05-05 MED ORDER — ONDANSETRON 4 MG PO TBDP: 4.0000 mg | ORAL_TABLET | Freq: Three times a day (TID) | ORAL | 0 refills | Status: DC | PRN

## 2017-05-06 ENCOUNTER — Encounter: Payer: Self-pay | Admitting: Family Medicine

## 2017-05-08 ENCOUNTER — Telehealth: Payer: Self-pay | Admitting: Family Medicine

## 2017-05-08 NOTE — Telephone Encounter (Signed)
Aware-will watch for ED notes 

## 2017-05-08 NOTE — Telephone Encounter (Signed)
[place] [facility] Patient Name: Dana Greer DOB: 04/28/1971 Initial Comment Caller states that she is vomiting, headache, body spasms, Nurse Assessment Nurse: Markus Daft, RN, [place] Date/Time (Eastern Time): 05/08/2017 9:11:34 AM Confirm and document reason for call. If symptomatic, describe symptoms. ---Caller states that she is vomiting, headache (rates now at 8/10), body spasms at times. Started Friday. Last episode of vomiting was Friday or Saturday. She can't recall the dates. No fever. Does the patient have any new or worsening symptoms? ---Yes Will a triage be completed? ---Yes Related visit to physician within the last 2 weeks? ---No Does the PT have any chronic conditions? (i.e. diabetes, asthma, etc.) ---Yes List chronic conditions. ---Fibromyalgia, Anxiety, Migraines Is the patient pregnant or possibly pregnant? (Ask all females between the ages of 64-55) ---No Is this a behavioral health or substance abuse call? ---No Guidelines Guideline Title Affirmed Question Affirmed Notes Headache Unable to walk, or can only walk with assistance (e.g., requires support) Final Disposition User Go to ED Now Markus Daft, RN, [place] Referrals [facility] - ED Disagree/Comply: Comply

## 2017-05-13 ENCOUNTER — Encounter: Payer: Self-pay | Admitting: Family Medicine

## 2017-05-15 ENCOUNTER — Ambulatory Visit (INDEPENDENT_AMBULATORY_CARE_PROVIDER_SITE_OTHER): Payer: Medicare Other | Admitting: Primary Care

## 2017-05-15 ENCOUNTER — Encounter: Payer: Self-pay | Admitting: Primary Care

## 2017-05-15 VITALS — BP 132/86 | HR 63 | Temp 98.9°F | Ht 62.5 in | Wt 202.8 lb

## 2017-05-15 DIAGNOSIS — M5442 Lumbago with sciatica, left side: Secondary | ICD-10-CM | POA: Diagnosis not present

## 2017-05-15 MED ORDER — METHOCARBAMOL 500 MG PO TABS
500.0000 mg | ORAL_TABLET | Freq: Three times a day (TID) | ORAL | 0 refills | Status: DC | PRN
Start: 2017-05-15 — End: 2017-10-26

## 2017-05-15 NOTE — Progress Notes (Signed)
Subjective:    Patient ID: Dana Greer, female    DOB: 1971/05/18, 46 y.o.   MRN: [id number]  HPI  Dana Greer is a 46 year old female who presents today with a chief complaint of back pain and rash.   1) Back Pain: Located to her left lower back that began yesterday. Her pain progressed gradually throughout the day. She was crying in pain yesterday. She's not taken anything OTC for her symptoms. She denies numbness/tingling. Her pain radiates to her left mid lower extremity. Overall pain has improved.   2) Rash: Located to her bilateral lower extremities that she first noticed about 1 week. She stopped taking Clonidine Thursday last week with near resolve in her itching and improvement in rash. She's been taking Clonidine for about 1 year per her psychiatrist as a sleeping aid. She's been monitoring her blood pressure which has been stable. She has been through a lot of stress over the past 2 weeks since the passing of her father.   Review of Systems  Musculoskeletal: Positive for back pain.  Skin: Positive for rash.  Neurological: Negative for weakness and numbness.       Past Medical History:  Diagnosis Date  . Arthritis   . Depression   . Headache(784.0)   . HSV-2 infection   . Immune deficiency disorder ([place])   . Stroke ([place])   . Thrombocytopenia ([place]) 10/01/2012     Social History   Social History  . Marital status: Legally Separated    Spouse name: N/A  . Number of children: 3  . Years of education: N/A   Occupational History  . Collections, [employer]   Social History Main Topics  . Smoking status: Never Smoker  . Smokeless tobacco: Never Used     Comment: Pt states she smokes.....but "not cigarettes"  . Alcohol use 1.2 oz/week    2 Glasses of wine per week     Comment: occ  . Drug use: Yes    Types: Marijuana  . Sexual activity: Yes    Birth control/ protection: None   Other Topics Concern  . Not on file   Social History Narrative  . No  narrative on file    Past Surgical History:  Procedure Laterality Date  . ABDOMINAL HYSTERECTOMY    . CESAREAN SECTION  2008   x1  . PARTIAL HYSTERECTOMY  2012   due to fibroid  . UPPER GASTROINTESTINAL ENDOSCOPY    . WISDOM TOOTH EXTRACTION  1995    Family History  Problem Relation Age of Onset  . Diabetes Father   . Breast cancer Paternal Aunt   . Colon cancer Paternal Grandmother     Allergies  Allergen Reactions  . Trazodone And Nefazodone Nausea And Vomiting  . Amoxicillin Other (See Comments)    Yeast infection   . Carafate [Sucralfate] Nausea And Vomiting    Current Outpatient Prescriptions on File Prior to Visit  Medication Sig Dispense Refill  . ALPRAZolam (XANAX) 1 MG tablet Take 1 mg by mouth QID.     . cloNIDine (CATAPRES) 0.2 MG tablet Take 0.2 mg by mouth daily.    Marland Kitchen dicyclomine (BENTYL) 10 MG capsule Take 1 capsule (10 mg total) by mouth daily as needed for spasms. (Patient not taking: Reported on 05/15/2017)    . etonogestrel (IMPLANON) 68 MG IMPL implant Inject 1 each into the skin continuous. Reported on 01/27/2016    . fluticasone (FLONASE) 50 MCG/ACT nasal spray Place  2 sprays into both nostrils daily. (Patient not taking: Reported on 05/15/2017) 16 g 6  . gabapentin (NEURONTIN) 300 MG capsule Take 1-2 capsules (300-600 mg total) by mouth 3 (three) times daily. (Patient not taking: Reported on 05/15/2017) 180 capsule 11  . meclizine (ANTIVERT) 25 MG tablet Take 25 mg by mouth 3 (three) times daily as needed for dizziness.    . ondansetron (ZOFRAN ODT) 4 MG disintegrating tablet Take 1 tablet (4 mg total) by mouth every 8 (eight) hours as needed for nausea or vomiting. (Patient not taking: Reported on 05/15/2017) 20 tablet 0  . PARoxetine (PAXIL) 40 MG tablet Take 1 tablet (40 mg total) by mouth daily. (Patient not taking: Reported on 05/15/2017) 30 tablet 11  . promethazine (PHENERGAN) 25 MG tablet TAKE 1 TABLET BY MOUTH EVERY 8 HOURS AS NEEDED FOR NAUSEA AND  VOMITING. (Patient not taking: Reported on 05/15/2017) 30 tablet 3  . ranitidine (ZANTAC) 75 MG tablet Take 150 mg by mouth every morning.     Marland Kitchen tetrahydrozoline 0.05 % ophthalmic solution Place 1 drop into both eyes daily as needed (for dry eyes).    . valACYclovir (VALTREX) 500 MG tablet Take 1 tablet (500 mg total) by mouth daily. (Patient not taking: Reported on 05/15/2017) 30 tablet 5   No current facility-administered medications on file prior to visit.     BP 132/86   Pulse 63   Temp 98.9 F (37.2 C) (Oral)   Ht 5' 2.5" (1.588 m)   Wt 202 lb 12.8 oz (92 kg)   BMI 36.50 kg/m    Objective:   Physical Exam  Constitutional: She appears well-nourished.  Neck: Neck supple.  Cardiovascular: Normal rate and regular rhythm.   Pulmonary/Chest: Effort normal and breath sounds normal. She has no wheezes.  Musculoskeletal:       Lumbar back: She exhibits decreased range of motion, tenderness, pain and spasm. She exhibits no bony tenderness.       Back:  Pain and tenderness to left lower back. Muscle tension. Appears uncomfortable.   Skin: Skin is warm and dry. Rash noted.  Mild rash to right lower extremity. Small, circular, mildly raised bumps 0.5 cm in diameter. Non tender. Few of these noted.          Assessment & Plan:  Acute back pain:  Occurred yesterday, overall pain has improved. Exam today consistent for muscle spasm given tenderness and location. Decrease in range of motion and tenderness as noted above. Prescription for methocarbamol every 8 hours as needed for spasms prescribed with drowsiness precautions. Given history of decreased kidney function, will avoid NSAIDs. Discussed stretching, refrain from sitting for prolonged periods of time. Follow-up as needed.  Rash:  Located to bilateral lower extremities 1 week. Rash has nearly resolved, urticaria has resolved since she stopped taking clonidine. She will notify her psychiatrist as this was prescribed as a  sleep aid. Blood pressure today stable, she will continue to monitor. She will notify if rash returns.  Sheral Flow, NP

## 2017-05-15 NOTE — Patient Instructions (Signed)
You may take methocarbamol 500 mg tablets every 8 hours as needed for back pain/spasms.  Refrain from sitting for prolonged periods of time as this will increase stiffness and pain.  Complete stretching exercises as discussed.  It was a pleasure meeting you!

## 2017-05-29 ENCOUNTER — Ambulatory Visit (INDEPENDENT_AMBULATORY_CARE_PROVIDER_SITE_OTHER): Payer: Medicare Other | Admitting: Psychology

## 2017-05-29 DIAGNOSIS — F331 Major depressive disorder, recurrent, moderate: Secondary | ICD-10-CM | POA: Diagnosis not present

## 2017-06-12 ENCOUNTER — Ambulatory Visit (INDEPENDENT_AMBULATORY_CARE_PROVIDER_SITE_OTHER): Payer: Medicare Other | Admitting: Psychology

## 2017-06-12 DIAGNOSIS — F331 Major depressive disorder, recurrent, moderate: Secondary | ICD-10-CM | POA: Diagnosis not present

## 2017-06-26 ENCOUNTER — Ambulatory Visit (INDEPENDENT_AMBULATORY_CARE_PROVIDER_SITE_OTHER): Payer: Medicare Other | Admitting: Psychology

## 2017-06-26 DIAGNOSIS — F331 Major depressive disorder, recurrent, moderate: Secondary | ICD-10-CM | POA: Diagnosis not present

## 2017-07-10 ENCOUNTER — Ambulatory Visit (INDEPENDENT_AMBULATORY_CARE_PROVIDER_SITE_OTHER): Payer: Medicare Other | Admitting: Psychology

## 2017-07-10 ENCOUNTER — Encounter: Payer: Self-pay | Admitting: Family Medicine

## 2017-07-10 ENCOUNTER — Ambulatory Visit (INDEPENDENT_AMBULATORY_CARE_PROVIDER_SITE_OTHER): Payer: Medicare Other | Admitting: Family Medicine

## 2017-07-10 ENCOUNTER — Telehealth: Payer: Self-pay

## 2017-07-10 VITALS — BP 136/80 | HR 63 | Temp 98.8°F | Ht 62.5 in | Wt 201.5 lb

## 2017-07-10 DIAGNOSIS — R7309 Other abnormal glucose: Secondary | ICD-10-CM

## 2017-07-10 DIAGNOSIS — R112 Nausea with vomiting, unspecified: Secondary | ICD-10-CM | POA: Diagnosis not present

## 2017-07-10 DIAGNOSIS — I10 Essential (primary) hypertension: Secondary | ICD-10-CM | POA: Diagnosis not present

## 2017-07-10 DIAGNOSIS — F331 Major depressive disorder, recurrent, moderate: Secondary | ICD-10-CM | POA: Diagnosis not present

## 2017-07-10 DIAGNOSIS — F4323 Adjustment disorder with mixed anxiety and depressed mood: Secondary | ICD-10-CM | POA: Diagnosis not present

## 2017-07-10 MED ORDER — ONDANSETRON 4 MG PO TBDP: 4.0000 mg | ORAL_TABLET | Freq: Three times a day (TID) | ORAL | 5 refills | Status: DC | PRN

## 2017-07-10 NOTE — Telephone Encounter (Signed)
I will see her then  

## 2017-07-10 NOTE — Telephone Encounter (Signed)
PLEASE NOTE: All timestamps contained within this report are represented as [place] Standard Time. CONFIDENTIALTY NOTICE: This fax transmission is intended only for the addressee. It contains information that is legally privileged, confidential or otherwise protected from use or disclosure. If you are not the intended recipient, you are strictly prohibited from reviewing, disclosing, copying using or disseminating any of this information or taking any action in reliance on or regarding this information. If you have received this fax in error, please notify us immediately by telephone so that we can arrange for its return to [place]. Phone: [phone], Toll-Free: [phone], Fax: [phone] Page: 1 of 2 Call Id: [id number] [place] Patient Name: Dana Greer Gender: Female DOB: Sep 01, 1971 Age: 46 Y 9 M 20 D Return Phone Number: [phone] (Primary), [phone] (Secondary) City/State/Zip: [place] Night - Client Client Site [place] [name], Roque Lias - MD Who Is Calling Patient / Member / Family / Caregiver Call Type Triage / Clinical Caller Name Marissa Relationship To Patient Daughter Return Phone Number [phone] (Secondary) Chief Complaint Vomiting Reason for Call Symptomatic / Request for [place] states her mother's head and eyes are burning and she is vomiting. Nurse Assessment Nurse: Neil Crouch RN, Baker Janus Date/Time [name] Time): 07/08/2017 6:09:20 AM Confirm and document reason for call. If symptomatic, describe symptoms. ---Caller states her mother's head and eyes are burning and she is vomiting. may be feverish and her head is burning.. eyes are not burning. hAVING AB PAIN AND CRAMPING. diarrhea Does the PT have any chronic conditions? (i.e. diabetes, asthma,  etc.) ---Yes List chronic conditions. ---gout, fm, vertigo, migraines Is the patient pregnant or possibly pregnant? (Ask all females between the ages of 46-55) ---No Guidelines Guideline Title Affirmed Question Vomiting MILD vomiting with diarrhea Disp. Time [name] Time) Disposition Final User 07/08/2017 6:19:53 AM Home Care Yes Neil Crouch, RN, [facility] Advice Given Per Guideline HOME CARE: You should be able to treat this at home. REASSURANCE AND EDUCATION: Vomiting and diarrhea are often caused by viral gastroenteritis (stomach flu) or mild food poisoning. Staying well-hydrated is the most important thing. CLEAR LIQUIDS: Try to sip small amounts (1 tablespoon or 15 ml) of liquid frequently (every 5 minutes) for 8 hours, rather than trying to drink a lot of liquid all at one time. * Sip water or a 1/2 strength sports drink (e.g., Gatorade or Powerade). * Other options: 1/2 strength flat lemon-lime soda or ginger ale. * After 4 hours without vomiting, increase the amount. SOLID FOOD: * You may begin eating bland foods after 8 hours without vomiting. * Start with saltine crackers, white bread, rice, mashed potatoes, cereal, applesauce, etc. * You can resume a normal diet in 24-48 hours. AVOID NON-ESSENTIAL MEDS: * Discontinue all vitamins and non-prescription medicines for 24 hours. (Reason: may make vomiting worse.) * Call if vomiting a prescription medicine. * Vomiting from viral gastroenteritis (stomach flu) or mild food poisoning usually stops in 12 to 48 hours. EXPECTED COURSE: * Diarrhea often lasts for several days. For mild diarrhea, follow the care advice for vomiting; you don't need to do anything special for mild diarrhea. CALL BACK IF: * Vomiting lasts more than 2 days (48 hours) * Diarrhea lasts more than 7 days * Constant stomach PLEASE NOTE: All timestamps contained within this report are represented as [place] Standard Time. CONFIDENTIALTY NOTICE: This fax transmission is  intended only for the addressee. It contains information that is legally privileged, confidential or otherwise protected from use or disclosure. If you are not the intended recipient, you are strictly prohibited from reviewing, disclosing, copying using or disseminating any of this information or taking any action in reliance on or regarding this information. If you have received this fax in error, please notify us immediately by telephone so that we can arrange for its return to [place]. Phone: [phone], Toll-Free: [phone], Fax: [phone] Page: 2 of 2 Call Id: [id number] Care Advice Given Per Guideline pain lasting more than 2 hours * Signs of dehydration (e.g., no urination over 12 hours, very lightheaded) * You become worse. CARE ADVICE per Vomiting (Adult) guideline.

## 2017-07-10 NOTE — Telephone Encounter (Signed)
Pt scheduled appt with Dr Glori Bickers 07/10/17 at 2:15

## 2017-07-10 NOTE — Assessment & Plan Note (Addendum)
bp in fair control at this time  She is back on catapres after disc that she is not allergic to it  BP Readings from Last 1 Encounters:  07/10/17 136/80   No changes needed Disc lifstyle change with low sodium diet and exercise

## 2017-07-10 NOTE — Patient Instructions (Addendum)
At the beginning of your GI episode (with upper abdominal pain)-take a bentyl  Take a xanax if you feel you need it Continue counseling   Eat a healthy diet and keep a food diary to see what foods set you off   Practice good self care   For sugar control  Keep working on weight loss Try to get most of your carbohydrates from produce (with the exception of white potatoes)  Eat less bread/pasta/rice/snack foods/cereals/sweets and other items from the middle of the grocery store (processed carbs)  Exercise really helps also

## 2017-07-10 NOTE — Progress Notes (Signed)
Subjective:    Patient ID: Dana Greer, female    DOB: 06/06/1971, 46 y.o.   MRN: [id number]  HPI Here with n/v diarrhea (recurrent)  Improved from this am  She took phenergan today  Is out of zofran    She is trying to change her eating habits  More smoothie type things  Junk food/ fatty foods do not agree with her   She has a hx of FODMAP eating plan to try    This is chronic and episodic   She finds if she takes xanax it helps a bit  Vomitus was "like mud" -but not black  Starts with upper abdominal pain on the L  Then rad to the R (doubles her over) Then she starts the n/v/d   IBS worse  Lost her father- stressful  Worries about her mom too    She has not started the dicyclomine for cramps (it constipates her if she takes it too much)  It does help     Wt Readings from Last 3 Encounters:  07/10/17 201 lb 8 oz (91.4 kg)  05/15/17 202 lb 12.8 oz (92 kg)  03/03/17 203 lb 4 oz (92.2 kg)    36.27 kg/m   Lab Results  Component Value Date   WBC 5.8 03/03/2017   HGB 13.7 03/03/2017   HCT 41.4 03/03/2017   MCV 99.3 03/03/2017   PLT 139.0 (L) 03/03/2017   Lab Results  Component Value Date   CREATININE 1.06 (H) 02/27/2017   BUN 11 02/27/2017   NA 140 02/27/2017   K 3.5 02/27/2017   CL 105 02/27/2017   CO2 24 02/27/2017   Pt mentions she had episode of hives- she originally thought it was from catapress Now thinks it was stress Back on it  BP Readings from Last 3 Encounters:  07/10/17 136/80  05/15/17 132/86  03/03/17 122/68   Lab Results  Component Value Date   HGBA1C 5.6 03/03/2017   Patient Active Problem List   Diagnosis Date Noted  . Yeast vaginitis 11/28/2016  . Abdominal pain 11/22/2016  . Acute-on-chronic kidney injury-II 11/22/2016  . Chronic myofascial pain 10/26/2016  . Screening mammogram, encounter for 10/25/2016  . Screening for lipoid disorders 10/20/2016  . HSV-2 infection 08/17/2016  . Renal insufficiency  03/08/2016  . Nausea & vomiting 03/08/2016  . Right knee pain 01/27/2016  . Adverse effects of medication 11/25/2015  . Vertigo 10/08/2015  . Pain of right thumb 03/16/2015  . Carpal tunnel syndrome 03/16/2015  . Left hip pain 02/17/2015  . Shoulder pain, left 02/17/2015  . Knee pain, bilateral 01/07/2015  . Gout 12/22/2014  . Visit for routine gyn exam 08/04/2014  . Visit for screening mammogram 08/04/2014  . Pain, joint, multiple sites 05/26/2014  . Vertiginous migraine 02/03/2014  . Thrombocytopenia ([place]) 10/01/2012  . Intractable migraine 08/31/2012  . Osteoarthritis 05/11/2012  . Female bladder prolapse 05/11/2012  . ADJ DISORDER WITH MIXED ANXIETY & DEPRESSED MOOD 06/30/2008  . HYPERHIDROSIS 01/25/2008  . BACK PAIN, LUMBAR, CHRONIC 10/16/2007  . REACTION, ACUTE STRESS W/EMOTIONAL DSTURB 08/01/2007  . HYPERGLYCEMIA 08/01/2007  . Essential hypertension 08/01/2007  . Goiter 07/11/2007  . POLYCYSTIC OVARIAN DISEASE 05/24/2007  . HIRSUTISM 05/24/2007  . Arthropathy, multiple sites 05/24/2007   Past Medical History:  Diagnosis Date  . Arthritis   . Depression   . Headache(784.0)   . HSV-2 infection   . Immune deficiency disorder ([place])   . Stroke ([place])   . Thrombocytopenia (  [place]) 10/01/2012   Past Surgical History:  Procedure Laterality Date  . ABDOMINAL HYSTERECTOMY    . CESAREAN SECTION  2008   x1  . PARTIAL HYSTERECTOMY  2012   due to fibroid  . UPPER GASTROINTESTINAL ENDOSCOPY    . [place] EXTRACTION  1995   Social History  Substance Use Topics  . Smoking status: Never Smoker  . Smokeless tobacco: Never Used     Comment: Pt states she smokes.....but "not cigarettes"  . Alcohol use 1.2 oz/week    2 Glasses of wine per week     Comment: occ   Family History  Problem Relation Age of Onset  . Diabetes Father   . Breast cancer Paternal Aunt   . Colon cancer Paternal Grandmother    Allergies  Allergen Reactions  . Trazodone And Nefazodone Nausea And  Vomiting  . Amoxicillin Other (See Comments)    Yeast infection   . Carafate [Sucralfate] Nausea And Vomiting   Current Outpatient Prescriptions on File Prior to Visit  Medication Sig Dispense Refill  . ALPRAZolam (XANAX) 1 MG tablet Take 1 mg by mouth QID.     . cloNIDine (CATAPRES) 0.2 MG tablet Take 0.2 mg by mouth daily.    Marland Kitchen etonogestrel (IMPLANON) 68 MG IMPL implant Inject 1 each into the skin continuous. Reported on 01/27/2016    . fluticasone (FLONASE) 50 MCG/ACT nasal spray Place 2 sprays into both nostrils daily. 16 g 6  . gabapentin (NEURONTIN) 300 MG capsule Take 1-2 capsules (300-600 mg total) by mouth 3 (three) times daily. 180 capsule 11  . meclizine (ANTIVERT) 25 MG tablet Take 25 mg by mouth 3 (three) times daily as needed for dizziness.    . methocarbamol (ROBAXIN) 500 MG tablet Take 1 tablet (500 mg total) by mouth every 8 (eight) hours as needed for muscle spasms. 15 tablet 0  . PARoxetine (PAXIL) 40 MG tablet Take 1 tablet (40 mg total) by mouth daily. 30 tablet 11  . promethazine (PHENERGAN) 25 MG tablet TAKE 1 TABLET BY MOUTH EVERY 8 HOURS AS NEEDED FOR NAUSEA AND VOMITING. 30 tablet 3  . ranitidine (ZANTAC) 75 MG tablet Take 150 mg by mouth every morning.     Marland Kitchen tetrahydrozoline 0.05 % ophthalmic solution Place 1 drop into both eyes daily as needed (for dry eyes).    . valACYclovir (VALTREX) 500 MG tablet Take 1 tablet (500 mg total) by mouth daily. 30 tablet 5  . dicyclomine (BENTYL) 10 MG capsule Take 1 capsule (10 mg total) by mouth daily as needed for spasms. (Patient not taking: Reported on 07/10/2017)     No current facility-administered medications on file prior to visit.     Review of Systems  Constitutional: Positive for appetite change and fatigue. Negative for activity change, fever and unexpected weight change.  HENT: Negative for congestion, ear pain, rhinorrhea, sinus pressure and sore throat.   Eyes: Negative for pain, redness and visual disturbance.    Respiratory: Negative for cough, shortness of breath and wheezing.   Cardiovascular: Negative for chest pain and palpitations.  Gastrointestinal: Positive for diarrhea, nausea and vomiting. Negative for abdominal pain, blood in stool and constipation.  Endocrine: Negative for polydipsia and polyuria.  Genitourinary: Negative for dysuria, frequency and urgency.  Musculoskeletal: Negative for arthralgias, back pain and myalgias.  Skin: Negative for pallor and rash.  Allergic/Immunologic: Negative for environmental allergies.  Neurological: Negative for dizziness, syncope and headaches.  Hematological: Negative for adenopathy. Does not bruise/bleed easily.  Psychiatric/Behavioral: Negative for decreased concentration and dysphoric mood. The patient is nervous/anxious.        Objective:   Physical Exam  Constitutional: She appears well-developed and well-nourished. No distress.  obese and well appearing   HENT:  Head: Normocephalic and atraumatic.  Mouth/Throat: Oropharynx is clear and moist.  Eyes: Pupils are equal, round, and reactive to light. Conjunctivae and EOM are normal. No scleral icterus.  Neck: Normal range of motion. Neck supple. No JVD present. Carotid bruit is not present.  Cardiovascular: Normal rate, regular rhythm, normal heart sounds and intact distal pulses.  Exam reveals no gallop.   Pulmonary/Chest: Effort normal and breath sounds normal. No respiratory distress. She has no wheezes. She has no rales.  No crackles  Abdominal: Soft. Bowel sounds are normal. She exhibits no distension, no abdominal bruit and no mass. There is no tenderness. There is no rebound and no guarding.  Musculoskeletal: She exhibits no edema.  Lymphadenopathy:    She has no cervical adenopathy.  Neurological: She is alert. She has normal reflexes.  Skin: Skin is warm and dry. No rash noted. No erythema. No pallor.  Nl skin color and turgor   Psychiatric: Her speech is normal and behavior is  normal. Her mood appears anxious. Her affect is not blunt and not labile.  Mildly anxious  Pleasant and talkative           Assessment & Plan:   Problem List Items Addressed This Visit      Cardiovascular and Mediastinum   Essential hypertension    bp in fair control at this time  She is back on catapres after disc that she is not allergic to it  BP Readings from Last 1 Encounters:  07/10/17 136/80   No changes needed Disc lifstyle change with low sodium diet and exercise          Digestive   Nausea & vomiting    Episodic This am episode is much improved Hydration status is good on exam  Disc game plan for re occurrence - incl xanax if needed for anx Has bentyl for cramps/abd pain prn  Phenergan for mild nausea zofran for more severe nausea - refilled  ? If stress and grief tend to cause exacerbations  Enc to keep food diary - she is interested in FODMAP plan        Other   [place]    With grief from loss of father  Continue counseling with Bambi- very helpful  Continue psychiatric care  This may exacerbate her n/v episodes       HYPERGLYCEMIA - Primary    Lab Results  Component Value Date   HGBA1C 5.6 03/03/2017   disc imp of low glycemic diet and wt loss to prevent DM2

## 2017-07-10 NOTE — Assessment & Plan Note (Signed)
Lab Results  Component Value Date   HGBA1C 5.6 03/03/2017   disc imp of low glycemic diet and wt loss to prevent DM2

## 2017-07-10 NOTE — Assessment & Plan Note (Signed)
With grief from loss of father  Continue counseling with Bambi- very helpful  Continue psychiatric care  This may exacerbate her n/v episodes

## 2017-07-10 NOTE — Assessment & Plan Note (Addendum)
Episodic This am episode is much improved Hydration status is good on exam  Disc game plan for re occurrence - incl xanax if needed for anx Has bentyl for cramps/abd pain prn  Phenergan for mild nausea zofran for more severe nausea - refilled  ? If stress and grief tend to cause exacerbations  Enc to keep food diary - she is interested in FODMAP plan

## 2017-07-23 ENCOUNTER — Encounter: Payer: Self-pay | Admitting: Family Medicine

## 2017-07-24 ENCOUNTER — Ambulatory Visit (INDEPENDENT_AMBULATORY_CARE_PROVIDER_SITE_OTHER): Payer: Medicare Other | Admitting: Psychology

## 2017-07-24 DIAGNOSIS — F331 Major depressive disorder, recurrent, moderate: Secondary | ICD-10-CM

## 2017-07-25 MED ORDER — BENZONATATE 200 MG PO CAPS: 200.0000 mg | ORAL_CAPSULE | Freq: Three times a day (TID) | ORAL | 1 refills | Status: DC | PRN

## 2017-07-26 ENCOUNTER — Encounter: Payer: Self-pay | Admitting: Family Medicine

## 2017-08-07 ENCOUNTER — Other Ambulatory Visit: Payer: Self-pay | Admitting: Family Medicine

## 2017-08-07 ENCOUNTER — Ambulatory Visit (INDEPENDENT_AMBULATORY_CARE_PROVIDER_SITE_OTHER): Payer: Medicare Other | Admitting: Psychology

## 2017-08-07 DIAGNOSIS — F331 Major depressive disorder, recurrent, moderate: Secondary | ICD-10-CM | POA: Diagnosis not present

## 2017-08-07 NOTE — Telephone Encounter (Signed)
Last filled on 04/18/17 #30 tabs with 0 refills, please advise

## 2017-08-07 NOTE — Telephone Encounter (Signed)
Please refill times 3 

## 2017-08-08 NOTE — Telephone Encounter (Signed)
done

## 2017-08-21 ENCOUNTER — Ambulatory Visit (INDEPENDENT_AMBULATORY_CARE_PROVIDER_SITE_OTHER): Payer: Medicare Other | Admitting: Psychology

## 2017-08-21 DIAGNOSIS — F331 Major depressive disorder, recurrent, moderate: Secondary | ICD-10-CM

## 2017-09-04 ENCOUNTER — Ambulatory Visit (INDEPENDENT_AMBULATORY_CARE_PROVIDER_SITE_OTHER): Payer: Medicare Other | Admitting: Psychology

## 2017-09-04 DIAGNOSIS — F331 Major depressive disorder, recurrent, moderate: Secondary | ICD-10-CM

## 2017-09-18 ENCOUNTER — Ambulatory Visit (INDEPENDENT_AMBULATORY_CARE_PROVIDER_SITE_OTHER): Payer: Medicare Other | Admitting: Psychology

## 2017-09-18 DIAGNOSIS — F331 Major depressive disorder, recurrent, moderate: Secondary | ICD-10-CM | POA: Diagnosis not present

## 2017-10-02 ENCOUNTER — Ambulatory Visit (INDEPENDENT_AMBULATORY_CARE_PROVIDER_SITE_OTHER): Payer: Medicare Other | Admitting: Psychology

## 2017-10-02 DIAGNOSIS — F331 Major depressive disorder, recurrent, moderate: Secondary | ICD-10-CM | POA: Diagnosis not present

## 2017-10-16 ENCOUNTER — Ambulatory Visit: Payer: Medicare Other | Admitting: Psychology

## 2017-10-17 ENCOUNTER — Ambulatory Visit (INDEPENDENT_AMBULATORY_CARE_PROVIDER_SITE_OTHER): Payer: Medicare Other | Admitting: Psychology

## 2017-10-17 DIAGNOSIS — F331 Major depressive disorder, recurrent, moderate: Secondary | ICD-10-CM

## 2017-10-25 ENCOUNTER — Other Ambulatory Visit: Payer: Self-pay | Admitting: Family Medicine

## 2017-10-25 ENCOUNTER — Ambulatory Visit: Payer: Self-pay | Admitting: *Deleted

## 2017-10-25 ENCOUNTER — Emergency Department (HOSPITAL_COMMUNITY)
Admission: EM | Admit: 2017-10-25 | Discharge: 2017-10-25 | Disposition: A | Discharge status: Home / Self Care | Payer: Medicare Other | Attending: Physician Assistant | Admitting: Physician Assistant

## 2017-10-25 ENCOUNTER — Other Ambulatory Visit: Payer: Self-pay

## 2017-10-25 ENCOUNTER — Telehealth: Payer: Self-pay | Admitting: Family Medicine

## 2017-10-25 ENCOUNTER — Encounter: Payer: Self-pay | Admitting: Family Medicine

## 2017-10-25 DIAGNOSIS — Z79899 Other long term (current) drug therapy: Secondary | ICD-10-CM | POA: Insufficient documentation

## 2017-10-25 DIAGNOSIS — G44209 Tension-type headache, unspecified, not intractable: Secondary | ICD-10-CM | POA: Insufficient documentation

## 2017-10-25 DIAGNOSIS — N182 Chronic kidney disease, stage 2 (mild): Secondary | ICD-10-CM | POA: Diagnosis not present

## 2017-10-25 DIAGNOSIS — I129 Hypertensive chronic kidney disease with stage 1 through stage 4 chronic kidney disease, or unspecified chronic kidney disease: Secondary | ICD-10-CM | POA: Insufficient documentation

## 2017-10-25 DIAGNOSIS — G4489 Other headache syndrome: Secondary | ICD-10-CM | POA: Diagnosis not present

## 2017-10-25 DIAGNOSIS — R030 Elevated blood-pressure reading, without diagnosis of hypertension: Secondary | ICD-10-CM | POA: Diagnosis not present

## 2017-10-25 DIAGNOSIS — R51 Headache: Secondary | ICD-10-CM | POA: Diagnosis present

## 2017-10-25 DIAGNOSIS — Z1231 Encounter for screening mammogram for malignant neoplasm of breast: Secondary | ICD-10-CM

## 2017-10-25 MED ORDER — ACETAMINOPHEN 325 MG PO TABS
650.0000 mg | ORAL_TABLET | Freq: Once | ORAL | Status: AC
Start: 2017-10-25 — End: 2017-10-25
  Administered 2017-10-25: 650 mg via ORAL
  Filled 2017-10-25: qty 2

## 2017-10-25 MED ORDER — SODIUM CHLORIDE 0.9 % IV BOLUS (SEPSIS)
1000.0000 mL | Freq: Once | INTRAVENOUS | Status: AC
  Administered 2017-10-25: 1000 mL via INTRAVENOUS

## 2017-10-25 MED ORDER — KETOROLAC TROMETHAMINE 30 MG/ML IJ SOLN
30.0000 mg | Freq: Once | INTRAMUSCULAR | Status: AC
  Administered 2017-10-25: 30 mg via INTRAVENOUS
  Filled 2017-10-25: qty 1

## 2017-10-25 NOTE — ED Provider Notes (Signed)
[facility] EMERGENCY DEPARTMENT Provider Note   CSN: [id number] Arrival date & time: 10/25/17  1819     History   Chief Complaint Chief Complaint  Patient presents with  . Headache    HPI Dana Greer is a 46 y.o. female.  HPI   Patient is a 46 year old female presenting with headache.  Patient reports her headache started today.  She reports she did not try taking anything.  Patient burst into tears and asked her how she was feeling.  She says that she is very stressed.  Her father passed away and she feels "like an orphan".  She reports that she is trying to say "strong for my family".  She reports that she sees a therapist every other week.  She denies any kind of SI.  She thinks that her headache is likely due to stress.  She reports that she had a little bit of tingling in her lips.  Aside from that no neurologic deficits.  Past Medical History:  Diagnosis Date  . Arthritis   . Depression   . Headache(784.0)   . HSV-2 infection   . Immune deficiency disorder ([place])   . Stroke ([place])   . Thrombocytopenia ([place]) 10/01/2012    Patient Active Problem List   Diagnosis Date Noted  . Yeast vaginitis 11/28/2016  . Abdominal pain 11/22/2016  . Acute-on-chronic kidney injury-II 11/22/2016  . Chronic myofascial pain 10/26/2016  . Screening mammogram, encounter for 10/25/2016  . Screening for lipoid disorders 10/20/2016  . HSV-2 infection 08/17/2016  . Renal insufficiency 03/08/2016  . Nausea & vomiting 03/08/2016  . Right knee pain 01/27/2016  . Adverse effects of medication 11/25/2015  . Vertigo 10/08/2015  . Pain of right thumb 03/16/2015  . Carpal tunnel syndrome 03/16/2015  . Left hip pain 02/17/2015  . Shoulder pain, left 02/17/2015  . Knee pain, bilateral 01/07/2015  . Gout 12/22/2014  . Visit for routine gyn exam 08/04/2014  . Visit for screening mammogram 08/04/2014  . Pain, joint, multiple sites 05/26/2014  . Vertiginous migraine  02/03/2014  . Thrombocytopenia ([place]) 10/01/2012  . Intractable migraine 08/31/2012  . Osteoarthritis 05/11/2012  . Female bladder prolapse 05/11/2012  . ADJ DISORDER WITH MIXED ANXIETY & DEPRESSED MOOD 06/30/2008  . HYPERHIDROSIS 01/25/2008  . BACK PAIN, LUMBAR, CHRONIC 10/16/2007  . REACTION, ACUTE STRESS W/EMOTIONAL DSTURB 08/01/2007  . HYPERGLYCEMIA 08/01/2007  . Essential hypertension 08/01/2007  . Goiter 07/11/2007  . POLYCYSTIC OVARIAN DISEASE 05/24/2007  . HIRSUTISM 05/24/2007  . Arthropathy, multiple sites 05/24/2007    Past Surgical History:  Procedure Laterality Date  . ABDOMINAL HYSTERECTOMY    . CESAREAN SECTION  2008   x1  . PARTIAL HYSTERECTOMY  2012   due to fibroid  . UPPER GASTROINTESTINAL ENDOSCOPY    . WISDOM TOOTH EXTRACTION  1995    OB History    Gravida Para Term Preterm AB Living   3         3   SAB TAB Ectopic Multiple Live Births                  Obstetric Comments   Pt states that she has had a lot of miscarriages. Do not know how many       Home Medications    Prior to Admission medications   Medication Sig Start Date End Date Taking? Authorizing Provider  ALPRAZolam Duanne Moron) 1 MG tablet Take 1 mg by mouth QID.     [provider]  benzonatate (TESSALON) 200 MG capsule Take 1 capsule (200 mg total) by mouth 3 (three) times daily as needed. Swallow whole, to not bite pill 07/25/17   [name], Wynelle Fanny, MD  cloNIDine (CATAPRES) 0.2 MG tablet Take 0.2 mg by mouth daily.    [provider]  dicyclomine (BENTYL) 10 MG capsule Take 1 capsule (10 mg total) by mouth daily as needed for spasms. Patient not taking: Reported on 07/10/2017 11/23/16   Modena Jansky, MD  etonogestrel (IMPLANON) 68 MG IMPL implant Inject 1 each into the skin continuous. Reported on 01/27/2016    [provider]  fluticasone (FLONASE) 50 MCG/ACT nasal spray Place 2 sprays into both nostrils daily. 04/07/14   [name], Wynelle Fanny, MD  gabapentin (NEURONTIN)  300 MG capsule Take 1-2 capsules (300-600 mg total) by mouth 3 (three) times daily. 03/03/17   [name], Wynelle Fanny, MD  meclizine (ANTIVERT) 25 MG tablet Take 25 mg by mouth 3 (three) times daily as needed for dizziness.    [provider]  methocarbamol (ROBAXIN) 500 MG tablet Take 1 tablet (500 mg total) by mouth every 8 (eight) hours as needed for muscle spasms. 05/15/17   Pleas Koch, NP  ondansetron (ZOFRAN ODT) 4 MG disintegrating tablet Take 1 tablet (4 mg total) by mouth every 8 (eight) hours as needed for nausea or vomiting. 07/10/17   [name], Wynelle Fanny, MD  PARoxetine (PAXIL) 40 MG tablet Take 1 tablet (40 mg total) by mouth daily. 05/26/14   [name], Wynelle Fanny, MD  promethazine (PHENERGAN) 25 MG tablet TAKE ONE TABLET BY MOUTH EVERY 8 HOURS AS NEEDED FOR NAUSEA AND VOMITING 08/08/17   [name], Wynelle Fanny, MD  ranitidine (ZANTAC) 75 MG tablet Take 150 mg by mouth every morning.     [provider]  tetrahydrozoline 0.05 % ophthalmic solution Place 1 drop into both eyes daily as needed (for dry eyes).    [provider]  valACYclovir (VALTREX) 500 MG tablet Take 1 tablet (500 mg total) by mouth daily. 11/15/16   [name], Wynelle Fanny, MD    Family History Family History  Problem Relation Age of Onset  . Diabetes Father   . Breast cancer Paternal Aunt   . Colon cancer Paternal Grandmother     Social History Social History   Tobacco Use  . Smoking status: Never Smoker  . Smokeless tobacco: Never Used  . Tobacco comment: Pt states she smokes.....but "not cigarettes"  Substance Use Topics  . Alcohol use: Yes    Alcohol/week: 1.2 oz    Types: 2 Glasses of wine per week    Comment: occ  . Drug use: Yes    Types: Marijuana     Allergies   Trazodone and nefazodone; Amoxicillin; and Carafate [sucralfate]   Review of Systems Review of Systems  Constitutional: Negative for activity change.  Respiratory: Negative for shortness of breath.   Cardiovascular: Negative for  chest pain.  Gastrointestinal: Negative for abdominal pain.  Neurological: Positive for headaches.  All other systems reviewed and are negative.    Physical Exam Updated Vital Signs BP (!) 165/111 (BP Location: Right Arm)   Pulse 75   Temp 98.5 F (36.9 C)   Ht 5\' 3"  (1.6 m)   Wt 90.7 kg (200 lb)   SpO2 100%   BMI 35.43 kg/m   Physical Exam  Constitutional: She is oriented to person, place, and time. She appears well-developed and well-nourished.  HENT:  Head: Normocephalic and atraumatic.  Eyes: EOM are normal. Pupils are equal, round, and reactive to light. Right eye exhibits no discharge.  Patient has in fake contacts.  Cardiovascular: Normal rate, regular rhythm and normal heart sounds.  No murmur heard. Pulmonary/Chest: Effort normal and breath sounds normal. She has no wheezes. She has no rales.  Abdominal: Soft. She exhibits no distension. There is no tenderness.  Neurological: She is alert and oriented to person, place, and time. She has normal strength. She is not disoriented. No cranial nerve deficit.  Skin: Skin is warm and dry. She is not diaphoretic.  Psychiatric: She has a normal mood and affect.  Nursing note and vitals reviewed.    ED Treatments / Results  Labs (all labs ordered are listed, but only abnormal results are displayed) Labs Reviewed - No data to display  EKG  EKG Interpretation None       Radiology No results found.  Procedures Procedures (including critical care time)  Medications Ordered in ED Medications - No data to display   Initial Impression / Assessment and Plan / ED Course  I have reviewed the triage vital signs and the nursing notes.  Pertinent labs & imaging results that were available during my care of the patient were reviewed by me and considered in my medical decision making (see chart for details).    Patient is a 46 year old female presenting with headache.  Patient reports her headache started today.  She  reports she did not try taking anything.  Patient burst into tears and asked her how she was feeling.  She says that she is very stressed.  Her father passed away and she feels "like an orphan".  She reports that she is trying to say "strong for my family".  She reports that she sees a therapist every other week.  She denies any kind of SI.  She thinks that her headache is likely due to stress.  She reports that she had a little bit of tingling in her lips.  Aside from that no neurologic deficits.  7:39 PM Will give patient fluids, ibuprofen, Tylenol.  Do not see neurologic deficits or any reason to do neurologic imaging this time.  This is likely a stress-induced headache.  /10:22 PM Patient's headache completely resolved.  We will have her follow-up with primary care physician.  Final Clinical Impressions(s) / ED Diagnoses   Final diagnoses:  None    ED Discharge Orders    None       Macarthur Critchley, MD 10/25/17 2222

## 2017-10-25 NOTE — ED Triage Notes (Signed)
Patient stated she has a headache starting this am. Pain is 9/10 and centers behind left eye. Patient also expresses emotional loss from father who passed in June.

## 2017-10-25 NOTE — Telephone Encounter (Signed)
Patient called in to ask if Dr. Madaline Guthrie recommended her to go to the hospital for her headaches. I informed her that there was no message from the doctor and she said she sent messages. She said "I don't want to go to the ED at [facility] and now my headaches are worse." I advised based on her previous triage call, it is recommended for her to go get evaluated, she stated "ok, I will go."

## 2017-10-25 NOTE — Telephone Encounter (Signed)
Headache started 1-2 hours ago- patient tingle in lips. Patient  states she has really bad headache- she   Reason for Disposition . [1] SEVERE headache (e.g., excruciating) AND [2] not improved after 2 hours of pain medicine  Answer Assessment - Initial Assessment Questions 1. LOCATION: "Where does it hurt?"      Left side of head 2. ONSET: "When did the headache start?" (Minutes, hours or days)      2 hours ago 3. PATTERN: "Does the pain come and go, or has it been constant since it started?"     Constant 4. SEVERITY: "How bad is the pain?" and "What does it keep you from doing?"  (e.g., Scale 1-10; mild, moderate, or severe)   - MILD (1-3): doesn't interfere with normal activities    - MODERATE (4-7): interferes with normal activities or awakens from sleep    - SEVERE (8-10): excruciating pain, unable to do any normal activities        severe 5. RECURRENT SYMPTOM: "Have you ever had headaches before?" If so, ask: "When was the last time?" and "What happened that time?"      Over 1 year since last migraine 6. CAUSE: "What do you think is causing the headache?"     stress 7. MIGRAINE: "Have you been diagnosed with migraine headaches?" If so, ask: "Is this headache similar?"      Yes- hx of migraine 8. HEAD INJURY: "Has there been any recent injury to the head?"      no 9. OTHER SYMPTOMS: "Do you have any other symptoms?" (fever, stiff neck, eye pain, sore throat, cold symptoms)     Eye pain- on side of headache 10. PREGNANCY: "Is there any chance you are pregnant?" "When was your last menstrual period?"       No-LMP over  Protocols used: HEADACHE-A-AH

## 2017-10-25 NOTE — Telephone Encounter (Signed)
Aware- looks like she is in the ED Will follow in epic

## 2017-10-25 NOTE — Telephone Encounter (Signed)
Dana Glance RN also noted; Patient going to [facility].

## 2017-10-25 NOTE — Discharge Instructions (Signed)
We are glad your headaches better.  Please return with any concerns.

## 2017-10-26 ENCOUNTER — Telehealth: Payer: Self-pay | Admitting: Family Medicine

## 2017-10-26 DIAGNOSIS — M5442 Lumbago with sciatica, left side: Secondary | ICD-10-CM

## 2017-10-26 MED ORDER — METHOCARBAMOL 500 MG PO TABS: 500.0000 mg | ORAL_TABLET | Freq: Three times a day (TID) | ORAL | 0 refills | Status: DC | PRN

## 2017-10-26 NOTE — Telephone Encounter (Signed)
Called pt but no answer and VM box is full, Robaxin sent to [facility] (CRM Created: if pt calls back please advise her of Dr. Marliss Coots comments)

## 2017-10-26 NOTE — Telephone Encounter (Signed)
PA was done for the methocarbamol at www.covermymeds.com I will await a response

## 2017-10-26 NOTE — Telephone Encounter (Signed)
Pt said she doesn't have anymore robaxin at home, that was an old Rx. Pt said she does have phenergan at home but she though it was only for nausea and vomiting not HA so she hasn't taken any. Also she hasn't tried tylenol or ibuprofen because she said she was told not to take those types of meds, please advise pt of what she should do

## 2017-10-26 NOTE — Telephone Encounter (Signed)
Please call in one refill of robaxin from her med list -it can help tension (and migraine) headaches both 2 reg strength tylenol would be ok today  No iburprofen however  The phenergan has been found to help both nausea and headaches in some folks so it may be helpful  Watch out for sedation as well  Try ice pack on head or neck as well

## 2017-10-26 NOTE — Telephone Encounter (Signed)
She has robaxin and phenergan on her med list- both of these can help an acute headache  Has she tried them? (does she have any on hand)  ED note says her HA went away in the ER with fluids /tylenol/nsaid  Has she tried tylenol or ibuprofen at home and is she drinking enough fluids?   Thanks

## 2017-10-26 NOTE — Telephone Encounter (Signed)
Copied from [place]. Topic: Quick Communication - See Telephone Encounter >> Oct 26, 2017  8:46 AM Bea Graff, NT wrote: CRM for notification. See Telephone encounter for: Pt is calling to see if Dr. Glori Bickers can call her something in for pain. Went to er yesterday for a terrible headache and they did not prescribe anything for her headaches. She states it is getting bad again. [place]   10/26/17.

## 2017-10-27 MED ORDER — TIZANIDINE HCL 4 MG PO TABS: 4.0000 mg | ORAL_TABLET | Freq: Four times a day (QID) | ORAL | 0 refills | Status: DC | PRN

## 2017-10-27 NOTE — Telephone Encounter (Signed)
I sent tizanidine to [facility]-that should work just as well  Thanks  Please let her know

## 2017-10-27 NOTE — Telephone Encounter (Signed)
Called pt and her VM box is still full, called [facility] and advise PA was denied but zanaflex was sent instead

## 2017-10-27 NOTE — Telephone Encounter (Signed)
PA was denied alt. Med highlighted, do you want to send in an alt before I try to call pt back again today, letter in your inbox

## 2017-10-27 NOTE — Telephone Encounter (Signed)
[facility] called back pt paid cash for the robaxin so they cancelled the zanaflex we sent in

## 2017-10-30 ENCOUNTER — Ambulatory Visit: Payer: Self-pay | Admitting: Psychology

## 2017-10-31 ENCOUNTER — Encounter (HOSPITAL_COMMUNITY): Payer: Self-pay | Admitting: Emergency Medicine

## 2017-10-31 ENCOUNTER — Emergency Department (HOSPITAL_COMMUNITY)
Admission: EM | Admit: 2017-10-31 | Discharge: 2017-10-31 | Discharge status: Against Medical Advice | Payer: Medicare Other | Attending: Emergency Medicine | Admitting: Emergency Medicine

## 2017-10-31 DIAGNOSIS — Z5321 Procedure and treatment not carried out due to patient leaving prior to being seen by health care provider: Secondary | ICD-10-CM | POA: Diagnosis not present

## 2017-10-31 DIAGNOSIS — R109 Unspecified abdominal pain: Secondary | ICD-10-CM | POA: Insufficient documentation

## 2017-10-31 DIAGNOSIS — R030 Elevated blood-pressure reading, without diagnosis of hypertension: Secondary | ICD-10-CM | POA: Diagnosis not present

## 2017-10-31 LAB — CBC
HCT: 40.6 % (ref 36.0–46.0)
Hemoglobin: 14.3 g/dL (ref 12.0–15.0)
MCH: 33.4 pg (ref 26.0–34.0)
MCHC: 35.2 g/dL (ref 30.0–36.0)
MCV: 94.9 fL (ref 78.0–100.0)
Platelets: 160 10*3/uL (ref 150–400)
RBC: 4.28 MIL/uL (ref 3.87–5.11)
RDW: 13.8 % (ref 11.5–15.5)
WBC: 10.9 10*3/uL — ABNORMAL HIGH (ref 4.0–10.5)

## 2017-10-31 LAB — I-STAT BETA HCG BLOOD, ED (MC, WL, AP ONLY): I-stat hCG, quantitative: <5 m[IU]/mL (ref <5)

## 2017-10-31 LAB — URINALYSIS, ROUTINE W REFLEX MICROSCOPIC
Bacteria, UA: NONE SEEN
Bilirubin Urine: NEGATIVE
GLUCOSE, UA: 150 mg/dL — AB
Ketones, ur: 80 mg/dL — AB
Leukocytes, UA: NEGATIVE
Nitrite: NEGATIVE
PH: 8 (ref 5.0–8.0)
PROTEIN: NEGATIVE mg/dL
SPECIFIC GRAVITY, URINE: 1.011 (ref 1.005–1.030)

## 2017-10-31 LAB — COMPREHENSIVE METABOLIC PANEL
ALBUMIN: 4.8 g/dL (ref 3.5–5.0)
ALK PHOS: 50 U/L (ref 38–126)
ALT: 21 U/L (ref 14–54)
AST: 35 U/L (ref 15–41)
Anion gap: 9 (ref 5–15)
BILIRUBIN TOTAL: 1.1 mg/dL (ref 0.3–1.2)
BUN: 8 mg/dL (ref 6–20)
CALCIUM: 9.2 mg/dL (ref 8.9–10.3)
CO2: 23 mmol/L (ref 22–32)
CREATININE: 0.86 mg/dL (ref 0.44–1.00)
Chloride: 103 mmol/L (ref 101–111)
GFR calc Af Amer: >60 mL/min (ref >60)
GFR calc non Af Amer: >60 mL/min (ref >60)
Glucose, Bld: 143 mg/dL — ABNORMAL HIGH (ref 65–99)
Potassium: 4.3 mmol/L (ref 3.5–5.1)
SODIUM: 135 mmol/L (ref 135–145)
TOTAL PROTEIN: 8 g/dL (ref 6.5–8.1)

## 2017-10-31 LAB — LIPASE, BLOOD: Lipase: 20 U/L (ref 11–51)

## 2017-10-31 NOTE — ED Notes (Signed)
Patient reports she will not leave with IV in place and states she will make triage staff should she decide to leave.

## 2017-10-31 NOTE — ED Triage Notes (Signed)
Per EMS, patient c/o N/V/D and abdominal pain since this morning. Ambulatory.  18g R AC  4 mg Zofran with EMS  BP 200/110-HTN hx HR 60 RR 16 O2 100% CBG 143

## 2017-10-31 NOTE — ED Notes (Signed)
IV removed from right AC. Patient reports she is leaving.

## 2017-11-01 ENCOUNTER — Telehealth: Payer: Self-pay

## 2017-11-01 ENCOUNTER — Encounter: Payer: Self-pay | Admitting: Family Medicine

## 2017-11-01 DIAGNOSIS — N644 Mastodynia: Secondary | ICD-10-CM

## 2017-11-01 NOTE — Telephone Encounter (Signed)
PLEASE NOTE: All timestamps contained within this report are represented as [place] Standard Time. CONFIDENTIALTY NOTICE: This fax transmission is intended only for the addressee. It contains information that is legally privileged, confidential or otherwise protected from use or disclosure. If you are not the intended recipient, you are strictly prohibited from reviewing, disclosing, copying using or disseminating any of this information or taking any action in reliance on or regarding this information. If you have received this fax in error, please notify us immediately by telephone so that we can arrange for its return to [place]. Phone: [phone], Toll-Free: [phone], Fax: [phone] Page: 1 of 1 Call Id: [id number] [place] Patient Name: Dana Greer Gender: Female DOB: May 01, 1971 Age: 47 Y 37 M 13 D Return Phone Number: [phone] (Alternate) Address: City/State/Zip: [place] Client [name] - Client Client Site [name], Roque Lias - MD Contact Type Call Who Is Calling Patient / Member / Family / Caregiver Call Type Triage / Clinical Caller Name Elon Spanner Relationship To Patient Mother Return Phone Number [phone] (Alternate) Chief Complaint Vomiting Reason for Call Symptomatic / Request for [place] says daughter has been throwing up and diarrhea. Brought her some Pedialyte and not helping, sweating a lot to. She is having abdominal cramping as well. Translation No No Triage Reason Patient declined Nurse Assessment Nurse: Matthew Saras - RN, [place] Date/Time [name] Time): 10/31/2017 1:36:28 PM Confirm and document reason for call. If symptomatic, describe symptoms. ---Caller states daughter is being taken to the hospital by EMS Does the patient have any new or worsening  symptoms? ---Yes Will a triage be completed? ---No Select reason for no triage. ---Patient declined Please document clinical information provided and list any resource used. ---EMS is taking her to the hospital right now Guidelines Guideline Title Affirmed Question Affirmed Notes Nurse Date/Time ([place] Time) Disp. Time [name] Time) Disposition Final User 10/31/2017 1:37:49 PM Clinical Call Yes Matthew Saras - RN, [facility]

## 2017-11-01 NOTE — Telephone Encounter (Signed)
She did go to the ED

## 2017-11-01 NOTE — Telephone Encounter (Signed)
Per chart review tab pt was seen [facility] ED.

## 2017-11-02 DIAGNOSIS — N644 Mastodynia: Secondary | ICD-10-CM | POA: Insufficient documentation

## 2017-11-02 NOTE — Telephone Encounter (Signed)
Please ref for diag mm/ [place] for breast pain See her email  Thanks

## 2017-11-07 ENCOUNTER — Ambulatory Visit (INDEPENDENT_AMBULATORY_CARE_PROVIDER_SITE_OTHER): Payer: Medicare Other | Admitting: Family Medicine

## 2017-11-07 ENCOUNTER — Encounter: Payer: Self-pay | Admitting: Family Medicine

## 2017-11-07 VITALS — BP 132/78 | HR 73 | Temp 98.7°F | Ht 62.5 in | Wt 196.5 lb

## 2017-11-07 DIAGNOSIS — G8929 Other chronic pain: Secondary | ICD-10-CM

## 2017-11-07 DIAGNOSIS — I10 Essential (primary) hypertension: Secondary | ICD-10-CM

## 2017-11-07 DIAGNOSIS — R112 Nausea with vomiting, unspecified: Secondary | ICD-10-CM

## 2017-11-07 DIAGNOSIS — M7918 Myalgia, other site: Secondary | ICD-10-CM | POA: Diagnosis not present

## 2017-11-07 MED ORDER — VALACYCLOVIR HCL 1 G PO TABS: 1000.0000 mg | ORAL_TABLET | Freq: Three times a day (TID) | ORAL | 0 refills | Status: DC

## 2017-11-07 NOTE — Progress Notes (Signed)
Subjective:    Patient ID: Dana Greer, female    DOB: 1971/03/06, 47 y.o.   MRN: [id number]  HPI Here for f/u of ED visit -attempted on 1/1 , and also a pulled muscle  She left w/o full visit  C/o n/v/d and abdominal pain since that am  Given 4 mg zofran with ems  bp was initially 200/110   Much improved now   Muscle spasm in her L buttock going down the back of her leg  Used heat/epsom salt  Pain radiates to external vaginal area  Her L labia is hanging lower (swollen and sore) Took nsaid -and it helped significantly  Has to lean on the other side when sitting  Some improvement today  Not sexually active   No vag d/c or bleeding  2 weeks ago had some cramps (planned to call gyn)    Thought she may have had an outbreak of HSV -took her valtrex    Wt Readings from Last 3 Encounters:  11/07/17 196 lb 8 oz (89.1 kg)  10/25/17 200 lb (90.7 kg)  07/10/17 201 lb 8 oz (91.4 kg)   35.37 kg/m    Lab Results  Component Value Date   CREATININE 0.86 10/31/2017   BUN 8 10/31/2017   NA 135 10/31/2017   K 4.3 10/31/2017   CL 103 10/31/2017   CO2 23 10/31/2017   Lab Results  Component Value Date   ALT 21 10/31/2017   AST 35 10/31/2017   ALKPHOS 50 10/31/2017   BILITOT 1.1 10/31/2017   Lab Results  Component Value Date   WBC 10.9 (H) 10/31/2017   HGB 14.3 10/31/2017   HCT 40.6 10/31/2017   MCV 94.9 10/31/2017   PLT 160 10/31/2017    Glucose was 143  Had a partial hysterectomy in 2012   bp is stable today  No cp or palpitations or headaches or edema  No side effects to medicines  BP Readings from Last 3 Encounters:  11/07/17 132/78  10/31/17 (!) 194/93  10/25/17 (!) 130/91      H/o chronic myofascial pain    Stress reaction-continues counseling with [facility]   Patient Active Problem List   Diagnosis Date Noted  . Left buttock pain 11/07/2017  . Breast pain, left 11/02/2017  . Yeast vaginitis 11/28/2016  . Abdominal pain 11/22/2016  .  Acute-on-chronic kidney injury-II 11/22/2016  . Chronic myofascial pain 10/26/2016  . Screening mammogram, encounter for 10/25/2016  . Screening for lipoid disorders 10/20/2016  . HSV-2 infection 08/17/2016  . Renal insufficiency 03/08/2016  . Nausea & vomiting 03/08/2016  . Right knee pain 01/27/2016  . Adverse effects of medication 11/25/2015  . Vertigo 10/08/2015  . Pain of right thumb 03/16/2015  . Carpal tunnel syndrome 03/16/2015  . Left hip pain 02/17/2015  . Shoulder pain, left 02/17/2015  . Knee pain, bilateral 01/07/2015  . Gout 12/22/2014  . Visit for routine gyn exam 08/04/2014  . Visit for screening mammogram 08/04/2014  . Pain, joint, multiple sites 05/26/2014  . Vertiginous migraine 02/03/2014  . Thrombocytopenia ([place]) 10/01/2012  . Intractable migraine 08/31/2012  . Osteoarthritis 05/11/2012  . Female bladder prolapse 05/11/2012  . ADJ DISORDER WITH MIXED ANXIETY & DEPRESSED MOOD 06/30/2008  . HYPERHIDROSIS 01/25/2008  . BACK PAIN, LUMBAR, CHRONIC 10/16/2007  . REACTION, ACUTE STRESS W/EMOTIONAL DSTURB 08/01/2007  . HYPERGLYCEMIA 08/01/2007  . Essential hypertension 08/01/2007  . Goiter 07/11/2007  . POLYCYSTIC OVARIAN DISEASE 05/24/2007  . HIRSUTISM 05/24/2007  .  Arthropathy, multiple sites 05/24/2007   Past Medical History:  Diagnosis Date  . Arthritis   . Depression   . Headache(784.0)   . HSV-2 infection   . Immune deficiency disorder ([place])   . Stroke ([place])   . Thrombocytopenia ([place]) 10/01/2012   Past Surgical History:  Procedure Laterality Date  . ABDOMINAL HYSTERECTOMY    . CESAREAN SECTION  2008   x1  . PARTIAL HYSTERECTOMY  2012   due to fibroid  . UPPER GASTROINTESTINAL ENDOSCOPY    . WISDOM TOOTH EXTRACTION  1995   Social History   Tobacco Use  . Smoking status: Never Smoker  . Smokeless tobacco: Never Used  . Tobacco comment: Pt states she smokes.....but "not cigarettes"  Substance Use Topics  . Alcohol use: Yes    Alcohol/week:  1.2 oz    Types: 2 Glasses of wine per week    Comment: occ  . Drug use: Yes    Types: Marijuana   Family History  Problem Relation Age of Onset  . Diabetes Father   . Breast cancer Paternal Aunt   . Colon cancer Paternal Grandmother    Allergies  Allergen Reactions  . Trazodone And Nefazodone Nausea And Vomiting  . Amoxicillin Other (See Comments)    Yeast infection   . Carafate [Sucralfate] Nausea And Vomiting   Current Outpatient Medications on File Prior to Visit  Medication Sig Dispense Refill  . ALPRAZolam (XANAX) 1 MG tablet Take 1 mg by mouth QID.     Marland Kitchen amitriptyline (ELAVIL) 25 MG tablet Take 25 mg by mouth daily.    . cloNIDine (CATAPRES) 0.2 MG tablet Take 0.2 mg by mouth daily.    Marland Kitchen dicyclomine (BENTYL) 10 MG capsule Take 1 capsule (10 mg total) by mouth daily as needed for spasms.    Marland Kitchen etonogestrel (IMPLANON) 68 MG IMPL implant Inject 1 each into the skin continuous. Reported on 01/27/2016    . fluticasone (FLONASE) 50 MCG/ACT nasal spray Place 2 sprays into both nostrils daily. 16 g 6  . gabapentin (NEURONTIN) 300 MG capsule Take 1-2 capsules (300-600 mg total) by mouth 3 (three) times daily. (Patient taking differently: Take 900 mg by mouth 3 (three) times daily. ) 180 capsule 11  . meclizine (ANTIVERT) 25 MG tablet Take 25 mg by mouth 3 (three) times daily as needed for dizziness.    . methocarbamol (ROBAXIN) 500 MG tablet Take 1 tablet (500 mg total) by mouth every 8 (eight) hours as needed for muscle spasms (or Migraine). 15 tablet 0  . ondansetron (ZOFRAN ODT) 4 MG disintegrating tablet Take 1 tablet (4 mg total) by mouth every 8 (eight) hours as needed for nausea or vomiting. 20 tablet 5  . PARoxetine (PAXIL) 40 MG tablet Take 1 tablet (40 mg total) by mouth daily. 30 tablet 11  . promethazine (PHENERGAN) 25 MG tablet TAKE ONE TABLET BY MOUTH EVERY 8 HOURS AS NEEDED FOR NAUSEA AND VOMITING (Patient taking differently: TAKE 25 mg  TABLET BY MOUTH EVERY 8 HOURS AS  NEEDED FOR NAUSEA AND VOMITING) 30 tablet 3  . ranitidine (ZANTAC) 75 MG tablet Take 150 mg by mouth every morning.     Marland Kitchen tetrahydrozoline 0.05 % ophthalmic solution Place 1 drop into both eyes daily as needed (for dry eyes).    . valACYclovir (VALTREX) 500 MG tablet Take 1 tablet (500 mg total) by mouth daily. 30 tablet 5   No current facility-administered medications on file prior to visit.  Review of Systems  Constitutional: Negative for activity change, appetite change, fatigue, fever and unexpected weight change.  HENT: Negative for congestion, ear pain, rhinorrhea, sinus pressure and sore throat.   Eyes: Negative for pain, redness and visual disturbance.  Respiratory: Negative for cough, shortness of breath and wheezing.   Cardiovascular: Negative for chest pain and palpitations.  Gastrointestinal: Positive for nausea. Negative for abdominal pain, blood in stool, constipation and diarrhea.  Endocrine: Negative for polydipsia and polyuria.  Genitourinary: Negative for dysuria, frequency and urgency.  Musculoskeletal: Positive for back pain. Negative for arthralgias and myalgias.       Pos for L buttock and leg and groin pain   Skin: Negative for pallor and rash.       Neg for rash/ pos for hx of HSV  Allergic/Immunologic: Negative for environmental allergies.  Neurological: Negative for dizziness, syncope and headaches.  Hematological: Negative for adenopathy. Does not bruise/bleed easily.  Psychiatric/Behavioral: Negative for decreased concentration and dysphoric mood. The patient is not nervous/anxious.        Pos for fatigue with stressors        Objective:   Physical Exam  Constitutional: She appears well-developed and well-nourished. No distress.  obese and well appearing   HENT:  Head: Normocephalic and atraumatic.  Mouth/Throat: Oropharynx is clear and moist.  Eyes: Conjunctivae and EOM are normal. Pupils are equal, round, and reactive to light. Right eye exhibits no  discharge. Left eye exhibits no discharge. No scleral icterus.  Neck: Normal range of motion. Neck supple.  Cardiovascular: Normal rate, regular rhythm and normal heart sounds.  Pulmonary/Chest: Breath sounds normal. No respiratory distress. She has no wheezes.  Abdominal: Soft. Bowel sounds are normal. She exhibits no distension. There is no rebound and no guarding.  Some tenderness in L groin area   Genitourinary:  Genitourinary Comments: L labia hangs lower than R - hard to tell if swollen  Some tenderness/ no erythema or warmth No inguinal LN No rash or break out  Nl vag mucosa w/o d/c    Musculoskeletal: She exhibits tenderness. She exhibits no edema.  Tender over L buttock- piriformis and greater troch/hamstring and groin area  Pain to rotate L hip-no dislocation of crepitus   Lymphadenopathy:    She has no cervical adenopathy.  Neurological: No cranial nerve deficit. She exhibits normal muscle tone. Coordination normal.  Skin: Skin is warm and dry. No rash noted. No erythema. No pallor.  Psychiatric: She has a normal mood and affect.  Cheerful and talkative today despite discomfort           Assessment & Plan:   Problem List Items Addressed This Visit      Cardiovascular and Mediastinum   Essential hypertension    bp in fair control at this time  BP Readings from Last 1 Encounters:  11/07/17 132/78   No changes needed Disc lifstyle change with low sodium diet and exercise  Much improved from ED visit         Digestive   Nausea & vomiting    Much imp from ED visit after zofran and fluids  Disc diet  Stress worsens this          Other   Chronic myofascial pain   Left buttock pain - Primary    Radiating to L groin/hip and labia  No rash but will proph tx for zoster and watch closely  Muscle spasm or back problem is also in the differential  Helped by nsaid (  diclofenac) and muscle relaxer  Hope for further imp soon and update

## 2017-11-07 NOTE — Patient Instructions (Addendum)
Continue warm compress on painful area  Take the valtrex (this is in case of early shingles without rash) 1 gram three times daily  Muscle relaxer as needed Diclofenac as directed with food (watch out for GI upset)  Watch for rash  Do follow up with your gyn if the pelvic pain continues/does not resolve    Please keep [place] posted

## 2017-11-08 NOTE — Assessment & Plan Note (Signed)
Much imp from ED visit after zofran and fluids  Disc diet  Stress worsens this

## 2017-11-08 NOTE — Assessment & Plan Note (Signed)
Radiating to L groin/hip and labia  No rash but will proph tx for zoster and watch closely  Muscle spasm or back problem is also in the differential  Helped by nsaid (diclofenac) and muscle relaxer  Hope for further imp soon and update

## 2017-11-08 NOTE — Assessment & Plan Note (Signed)
bp in fair control at this time  BP Readings from Last 1 Encounters:  11/07/17 132/78   No changes needed Disc lifstyle change with low sodium diet and exercise  Much improved from ED visit

## 2017-11-09 ENCOUNTER — Other Ambulatory Visit: Payer: Self-pay

## 2017-11-13 ENCOUNTER — Encounter: Payer: Self-pay | Admitting: Family Medicine

## 2017-11-13 ENCOUNTER — Ambulatory Visit (INDEPENDENT_AMBULATORY_CARE_PROVIDER_SITE_OTHER): Payer: Medicare Other | Admitting: Psychology

## 2017-11-13 DIAGNOSIS — F331 Major depressive disorder, recurrent, moderate: Secondary | ICD-10-CM | POA: Diagnosis not present

## 2017-11-15 ENCOUNTER — Ambulatory Visit
Admission: RE | Admit: 2017-11-15 | Discharge: 2017-11-15 | Disposition: A | Discharge status: Home / Self Care | Payer: Medicare Other | Source: Ambulatory Visit | Attending: Family Medicine | Admitting: Family Medicine

## 2017-11-15 ENCOUNTER — Ambulatory Visit: Payer: Self-pay

## 2017-11-15 DIAGNOSIS — R928 Other abnormal and inconclusive findings on diagnostic imaging of breast: Secondary | ICD-10-CM | POA: Diagnosis not present

## 2017-11-15 DIAGNOSIS — N644 Mastodynia: Secondary | ICD-10-CM

## 2017-11-15 DIAGNOSIS — N6323 Unspecified lump in the left breast, lower outer quadrant: Secondary | ICD-10-CM | POA: Diagnosis not present

## 2017-11-27 ENCOUNTER — Ambulatory Visit: Payer: Self-pay | Admitting: Psychology

## 2017-12-05 ENCOUNTER — Ambulatory Visit (INDEPENDENT_AMBULATORY_CARE_PROVIDER_SITE_OTHER): Payer: Medicare Other | Admitting: Psychology

## 2017-12-05 DIAGNOSIS — F331 Major depressive disorder, recurrent, moderate: Secondary | ICD-10-CM | POA: Diagnosis not present

## 2017-12-11 ENCOUNTER — Ambulatory Visit: Payer: Self-pay | Admitting: Psychology

## 2017-12-19 ENCOUNTER — Ambulatory Visit: Payer: Medicare Other | Admitting: Psychology

## 2017-12-25 ENCOUNTER — Ambulatory Visit: Payer: Self-pay | Admitting: Psychology

## 2018-01-02 ENCOUNTER — Encounter: Payer: Self-pay | Admitting: Family Medicine

## 2018-01-02 ENCOUNTER — Ambulatory Visit (INDEPENDENT_AMBULATORY_CARE_PROVIDER_SITE_OTHER): Payer: Medicare Other | Admitting: Psychology

## 2018-01-02 DIAGNOSIS — F331 Major depressive disorder, recurrent, moderate: Secondary | ICD-10-CM

## 2018-01-05 ENCOUNTER — Ambulatory Visit: Payer: Medicare Other | Admitting: Family Medicine

## 2018-01-08 ENCOUNTER — Ambulatory Visit: Payer: Self-pay | Admitting: Psychology

## 2018-01-09 ENCOUNTER — Encounter: Payer: Self-pay | Admitting: Family Medicine

## 2018-01-09 ENCOUNTER — Ambulatory Visit (INDEPENDENT_AMBULATORY_CARE_PROVIDER_SITE_OTHER): Payer: Medicare Other | Admitting: Family Medicine

## 2018-01-09 VITALS — BP 142/78 | HR 82 | Temp 98.3°F | Ht 62.5 in | Wt 203.2 lb

## 2018-01-09 DIAGNOSIS — Z23 Encounter for immunization: Secondary | ICD-10-CM | POA: Diagnosis not present

## 2018-01-09 DIAGNOSIS — R7309 Other abnormal glucose: Secondary | ICD-10-CM | POA: Diagnosis not present

## 2018-01-09 DIAGNOSIS — G8929 Other chronic pain: Secondary | ICD-10-CM | POA: Diagnosis not present

## 2018-01-09 DIAGNOSIS — E559 Vitamin D deficiency, unspecified: Secondary | ICD-10-CM | POA: Insufficient documentation

## 2018-01-09 DIAGNOSIS — M79671 Pain in right foot: Secondary | ICD-10-CM | POA: Diagnosis not present

## 2018-01-09 DIAGNOSIS — M79672 Pain in left foot: Secondary | ICD-10-CM | POA: Diagnosis not present

## 2018-01-09 DIAGNOSIS — M5440 Lumbago with sciatica, unspecified side: Secondary | ICD-10-CM | POA: Diagnosis not present

## 2018-01-09 DIAGNOSIS — I10 Essential (primary) hypertension: Secondary | ICD-10-CM

## 2018-01-09 DIAGNOSIS — M7918 Myalgia, other site: Secondary | ICD-10-CM | POA: Diagnosis not present

## 2018-01-09 DIAGNOSIS — R202 Paresthesia of skin: Secondary | ICD-10-CM

## 2018-01-09 LAB — BASIC METABOLIC PANEL
BUN: 12 mg/dL (ref 6–23)
CO2: 28 mEq/L (ref 19–32)
Calcium: 9.5 mg/dL (ref 8.4–10.5)
Chloride: 105 mEq/L (ref 96–112)
Creatinine, Ser: 1.06 mg/dL (ref 0.40–1.20)
GFR: 71.36 mL/min (ref >60.00)
GLUCOSE: 101 mg/dL — AB (ref 70–99)
POTASSIUM: 4.1 meq/L (ref 3.5–5.1)
Sodium: 139 mEq/L (ref 135–145)

## 2018-01-09 LAB — TSH: TSH: 0.9 u[IU]/mL (ref 0.35–4.50)

## 2018-01-09 LAB — VITAMIN B12: Vitamin B-12: 287 pg/mL (ref 211–911)

## 2018-01-09 LAB — VITAMIN D 25 HYDROXY (VIT D DEFICIENCY, FRACTURES): VITD: 7.11 ng/mL — AB (ref 30.00–100.00)

## 2018-01-09 LAB — HEMOGLOBIN A1C: Hgb A1c MFr Bld: 5.4 % (ref 4.6–6.5)

## 2018-01-09 NOTE — Patient Instructions (Addendum)
I think you do have carpal tunnel  I wonder if you also have peripheral neuropathy in hands/feet   Lab today- blood sugar   Flu shot today   Keep taking good care of hands and feet   Wear wrist splints at night   Lab today

## 2018-01-09 NOTE — Progress Notes (Signed)
Subjective:    Patient ID: Dana Greer, female    DOB: 06-26-1971, 47 y.o.   MRN: [id number]  HPI Here for f/u of chronic health problems   Pt e-mailed last week  Back pain is worse   Still doing yoga and stretching and meditation  Starts in her low back and now has moved up to top  Not radiating down to legs like it was before    Bottom of feet feel like there is "no padding"  Whole bottom of feet hurts  Tingling type of pain (she massages them a lot)  Also feel tight  occ a little numbness in hands and bottom   She is taking 900  Mg tid - she thinks it really helps (pain is unbearable w/o it)   Lab Results  Component Value Date   HGBA1C 5.6 03/03/2017  not more thirsty  Eating pretty well   Has vit D def   Numbness and ? Swelling in both hands  More than usual    In the setting of chronic myofascial pain syndrome    Wt Readings from Last 3 Encounters:  01/09/18 203 lb 4 oz (92.2 kg)  11/07/17 196 lb 8 oz (89.1 kg)  10/25/17 200 lb (90.7 kg)   36.58 kg/m    bp is up on firt check today No cp or palpitations or headaches or edema  No side effects to medicines  BP Readings from Last 3 Encounters:  01/09/18 (!) 142/78  11/07/17 132/78  10/31/17 (!) 194/93      Patient Active Problem List   Diagnosis Date Noted  . Foot pain, bilateral 01/09/2018  . Hand paresthesia 01/09/2018  . Vitamin D deficiency 01/09/2018  . Left buttock pain 11/07/2017  . Breast pain, left 11/02/2017  . Yeast vaginitis 11/28/2016  . Abdominal pain 11/22/2016  . Acute-on-chronic kidney injury-II 11/22/2016  . Chronic myofascial pain 10/26/2016  . Screening mammogram, encounter for 10/25/2016  . Screening for lipoid disorders 10/20/2016  . HSV-2 infection 08/17/2016  . Renal insufficiency 03/08/2016  . Nausea & vomiting 03/08/2016  . Right knee pain 01/27/2016  . Adverse effects of medication 11/25/2015  . Vertigo 10/08/2015  . Pain of right thumb 03/16/2015  .  Carpal tunnel syndrome 03/16/2015  . Left hip pain 02/17/2015  . Shoulder pain, left 02/17/2015  . Knee pain, bilateral 01/07/2015  . Gout 12/22/2014  . Visit for routine gyn exam 08/04/2014  . Visit for screening mammogram 08/04/2014  . Pain, joint, multiple sites 05/26/2014  . Vertiginous migraine 02/03/2014  . Thrombocytopenia ([place]) 10/01/2012  . Intractable migraine 08/31/2012  . Osteoarthritis 05/11/2012  . Female bladder prolapse 05/11/2012  . ADJ DISORDER WITH MIXED ANXIETY & DEPRESSED MOOD 06/30/2008  . HYPERHIDROSIS 01/25/2008  . BACK PAIN, LUMBAR, CHRONIC 10/16/2007  . REACTION, ACUTE STRESS W/EMOTIONAL DSTURB 08/01/2007  . HYPERGLYCEMIA 08/01/2007  . Essential hypertension 08/01/2007  . Goiter 07/11/2007  . POLYCYSTIC OVARIAN DISEASE 05/24/2007  . HIRSUTISM 05/24/2007  . Arthropathy, multiple sites 05/24/2007   Past Medical History:  Diagnosis Date  . Arthritis   . Depression   . Headache(784.0)   . HSV-2 infection   . Immune deficiency disorder ([place])   . Stroke ([place])   . Thrombocytopenia ([place]) 10/01/2012   Past Surgical History:  Procedure Laterality Date  . ABDOMINAL HYSTERECTOMY    . CESAREAN SECTION  2008   x1  . PARTIAL HYSTERECTOMY  2012   due to fibroid  . UPPER GASTROINTESTINAL ENDOSCOPY    .  WISDOM TOOTH EXTRACTION  1995   Social History   Tobacco Use  . Smoking status: Never Smoker  . Smokeless tobacco: Never Used  . Tobacco comment: Pt states she smokes.....but "not cigarettes"  Substance Use Topics  . Alcohol use: Yes    Alcohol/week: 1.2 oz    Types: 2 Glasses of wine per week    Comment: occ  . Drug use: Yes    Types: Marijuana   Family History  Problem Relation Age of Onset  . Diabetes Father   . Breast cancer Paternal Aunt   . Colon cancer Paternal Grandmother    Allergies  Allergen Reactions  . Trazodone And Nefazodone Nausea And Vomiting  . Amoxicillin Other (See Comments)    Yeast infection   . Carafate [Sucralfate]  Nausea And Vomiting   Current Outpatient Medications on File Prior to Visit  Medication Sig Dispense Refill  . ALPRAZolam (XANAX) 1 MG tablet Take 1 mg by mouth QID.     Marland Kitchen amitriptyline (ELAVIL) 25 MG tablet Take 25 mg by mouth daily.    . cloNIDine (CATAPRES) 0.2 MG tablet Take 0.2 mg by mouth daily.    Marland Kitchen dicyclomine (BENTYL) 10 MG capsule Take 1 capsule (10 mg total) by mouth daily as needed for spasms.    Marland Kitchen etonogestrel (IMPLANON) 68 MG IMPL implant Inject 1 each into the skin continuous. Reported on 01/27/2016    . fluticasone (FLONASE) 50 MCG/ACT nasal spray Place 2 sprays into both nostrils daily. 16 g 6  . gabapentin (NEURONTIN) 300 MG capsule Take 1-2 capsules (300-600 mg total) by mouth 3 (three) times daily. (Patient taking differently: Take 900 mg by mouth 3 (three) times daily. ) 180 capsule 11  . meclizine (ANTIVERT) 25 MG tablet Take 25 mg by mouth 3 (three) times daily as needed for dizziness.    . methocarbamol (ROBAXIN) 500 MG tablet Take 1 tablet (500 mg total) by mouth every 8 (eight) hours as needed for muscle spasms (or Migraine). 15 tablet 0  . ondansetron (ZOFRAN ODT) 4 MG disintegrating tablet Take 1 tablet (4 mg total) by mouth every 8 (eight) hours as needed for nausea or vomiting. 20 tablet 5  . PARoxetine (PAXIL) 40 MG tablet Take 1 tablet (40 mg total) by mouth daily. 30 tablet 11  . promethazine (PHENERGAN) 25 MG tablet TAKE ONE TABLET BY MOUTH EVERY 8 HOURS AS NEEDED FOR NAUSEA AND VOMITING (Patient taking differently: TAKE 25 mg  TABLET BY MOUTH EVERY 8 HOURS AS NEEDED FOR NAUSEA AND VOMITING) 30 tablet 3  . ranitidine (ZANTAC) 75 MG tablet Take 150 mg by mouth every morning.     Marland Kitchen tetrahydrozoline 0.05 % ophthalmic solution Place 1 drop into both eyes daily as needed (for dry eyes).    . valACYclovir (VALTREX) 1000 MG tablet Take 1 tablet (1,000 mg total) by mouth 3 (three) times daily. 21 tablet 0  . valACYclovir (VALTREX) 500 MG tablet Take 1 tablet (500 mg total)  by mouth daily. 30 tablet 5   No current facility-administered medications on file prior to visit.     Review of Systems  Constitutional: Positive for fatigue. Negative for activity change, appetite change, fever and unexpected weight change.  HENT: Negative for congestion, ear pain, rhinorrhea, sinus pressure and sore throat.   Eyes: Negative for pain, redness and visual disturbance.  Respiratory: Negative for cough, shortness of breath and wheezing.   Cardiovascular: Negative for chest pain and palpitations.  Gastrointestinal: Negative for abdominal pain, blood  in stool, constipation and diarrhea.  Endocrine: Negative for polydipsia and polyuria.  Genitourinary: Negative for dysuria, frequency and urgency.  Musculoskeletal: Positive for arthralgias, back pain and myalgias.  Skin: Negative for pallor and rash.  Allergic/Immunologic: Negative for environmental allergies.  Neurological: Positive for numbness. Negative for dizziness, syncope and headaches.  Hematological: Negative for adenopathy. Does not bruise/bleed easily.  Psychiatric/Behavioral: Negative for decreased concentration and dysphoric mood. The patient is not nervous/anxious.        Objective:   Physical Exam  Constitutional: She appears well-developed and well-nourished. No distress.  obese and well appearing   HENT:  Head: Normocephalic and atraumatic.  Mouth/Throat: Oropharynx is clear and moist.  Eyes: Conjunctivae and EOM are normal. Pupils are equal, round, and reactive to light. No scleral icterus.  Neck: Normal range of motion. Neck supple.  Cardiovascular: Normal rate, regular rhythm and normal heart sounds.  Pulmonary/Chest: Effort normal and breath sounds normal. No respiratory distress. She has no wheezes. She has no rales.  Musculoskeletal: She exhibits tenderness. She exhibits no edema or deformity.  Myofascial trigger points are noted- back and UEs  Hands are dorsally puffy w/o any joint deformity    Some tenderness over first MCP bilat  Lymphadenopathy:    She has no cervical adenopathy.  Neurological: She is alert. She has normal reflexes. No cranial nerve deficit. She exhibits normal muscle tone. Coordination normal.  Pt is extremely sensitive to the touch-anywhere on skin  Myofascial trigger points on back throughout -moreso on muscle than bone or spine   Hypersensitive feet -hesitant to let me touch them  Can feel soft touch and dull sens but not temp   Both hands pos tinel and phalen's tests -worse on the R  Grip is slt less on the R due to pain  Dec sens to lt touch over index fingers and thumb bilat  Nl temp sens in hands   Skin: Skin is warm and dry. No rash noted. No erythema. No pallor.  Psychiatric: She has a normal mood and affect.          Assessment & Plan:   Problem List Items Addressed This Visit      Cardiovascular and Mediastinum   Essential hypertension    bp up slt today- was in discomfort Continue to follow       Relevant Orders   Basic metabolic panel (Completed)     Other   BACK PAIN, LUMBAR, CHRONIC    This has been worse lately  Known hx of DDD No neuro symptoms  Plans to continue yoga and current medicines       Chronic myofascial pain    This may be worse lately as well  Continue elavil and gabapentin  Enc yoga/gentle stretching       Foot pain, bilateral    With tingling/hypersensitivity  Some features of periph neuropathy  Already on gabapentin for chronic pain issues  Lab today incl A1C Ref to neuro for eval      Relevant Orders   Ambulatory referral to Neurology   Hand paresthesia - Primary    With pos tinel /phalen  Suspect CTS Poss neuropathy  Lab today Ref to neuro      Relevant Orders   Ambulatory referral to Neurology   TSH (Completed)   Vitamin B12 (Completed)   HYPERGLYCEMIA    With some periph neuropathy symptoms Lab today for A1C      Relevant Orders   Hemoglobin A1c (Completed)   Vitamin  D  deficiency    Level today      Relevant Orders   VITAMIN D 25 Hydroxy (Vit-D Deficiency, Fractures) (Completed)    Other Visit Diagnoses    Need for influenza vaccination       Relevant Orders   Flu Vaccine QUAD 6+ mos PF IM (Fluarix Quad PF) (Completed)

## 2018-01-10 ENCOUNTER — Encounter: Payer: Self-pay | Admitting: Family Medicine

## 2018-01-10 ENCOUNTER — Telehealth: Payer: Self-pay | Admitting: *Deleted

## 2018-01-10 DIAGNOSIS — E538 Deficiency of other specified B group vitamins: Secondary | ICD-10-CM | POA: Insufficient documentation

## 2018-01-10 MED ORDER — VITAMIN D (ERGOCALCIFEROL) 1.25 MG (50000 UNIT) PO CAPS: 50000.0000 [IU] | ORAL_CAPSULE | ORAL | 0 refills | Status: DC

## 2018-01-10 NOTE — Assessment & Plan Note (Signed)
With some periph neuropathy symptoms Lab today for A1C

## 2018-01-10 NOTE — Telephone Encounter (Signed)
Pt notified of lab results and Dr. Marliss Coots comments. Rx sent to pharmacy, nurse appt scheduled for b12 inj and copy of labs placed at the front for pick up

## 2018-01-10 NOTE — Assessment & Plan Note (Signed)
Level today

## 2018-01-10 NOTE — Assessment & Plan Note (Signed)
bp up slt today- was in discomfort Continue to follow

## 2018-01-10 NOTE — Assessment & Plan Note (Signed)
With tingling/hypersensitivity  Some features of periph neuropathy  Already on gabapentin for chronic pain issues  Lab today incl A1C Ref to neuro for eval

## 2018-01-10 NOTE — Assessment & Plan Note (Signed)
This has been worse lately  Known hx of DDD No neuro symptoms  Plans to continue yoga and current medicines

## 2018-01-10 NOTE — Telephone Encounter (Signed)
-----   Message from Abner Greenspan, MD sent at 01/10/2018  8:20 AM EDT ----- D is very low Please send in ergocalciferol 50,000 iu weekly for 12 weeks #12 no ref  Take vit D3 otc 4000 iu daily from now on   B12 is fairly low Come in for B12 shot  Also get 1000 mcg B12 otc and take daily from now on

## 2018-01-10 NOTE — Assessment & Plan Note (Signed)
This may be worse lately as well  Continue elavil and gabapentin  Enc yoga/gentle stretching

## 2018-01-10 NOTE — Assessment & Plan Note (Signed)
With pos tinel /phalen  Suspect CTS Poss neuropathy  Lab today Ref to neuro

## 2018-01-11 ENCOUNTER — Ambulatory Visit (INDEPENDENT_AMBULATORY_CARE_PROVIDER_SITE_OTHER): Payer: Medicare Other | Admitting: *Deleted

## 2018-01-11 DIAGNOSIS — E538 Deficiency of other specified B group vitamins: Secondary | ICD-10-CM

## 2018-01-11 MED ORDER — CYANOCOBALAMIN 1000 MCG/ML IJ SOLN
1000.0000 ug | Freq: Once | INTRAMUSCULAR | Status: AC
  Administered 2018-01-11: 1000 ug via INTRAMUSCULAR

## 2018-01-16 ENCOUNTER — Ambulatory Visit (INDEPENDENT_AMBULATORY_CARE_PROVIDER_SITE_OTHER): Payer: Medicare Other | Admitting: Psychology

## 2018-01-16 DIAGNOSIS — F331 Major depressive disorder, recurrent, moderate: Secondary | ICD-10-CM

## 2018-01-22 ENCOUNTER — Ambulatory Visit: Payer: Self-pay | Admitting: Psychology

## 2018-01-23 ENCOUNTER — Telehealth: Payer: Self-pay

## 2018-01-23 NOTE — Telephone Encounter (Signed)
left message for patient to call Nathalia Wismer back in regards to referral-Masiyah Engen V Xee Hollman, RMA 

## 2018-01-24 IMAGING — MG 2D DIGITAL SCREENING BILATERAL MAMMOGRAM WITH CAD AND ADJUNCT TO
8 of 15 series · 8 of 31 positions shown · non-contrast
Comparison: Previous exam(s).

CLINICAL DATA: Screening.

EXAM:
2D DIGITAL SCREENING BILATERAL MAMMOGRAM WITH CAD AND ADJUNCT TOMO

[R MLO]
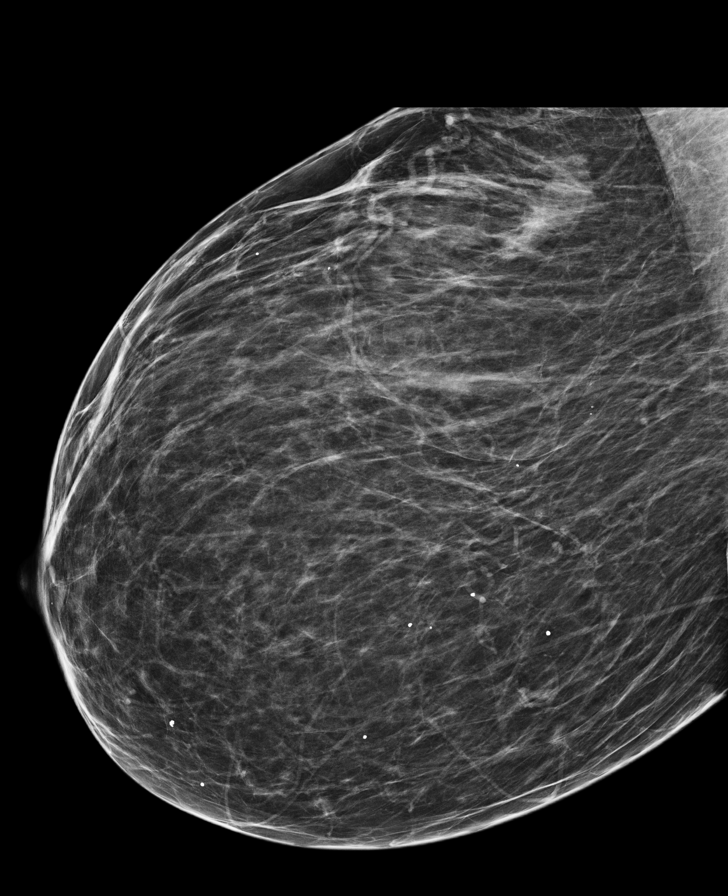

[L CC]
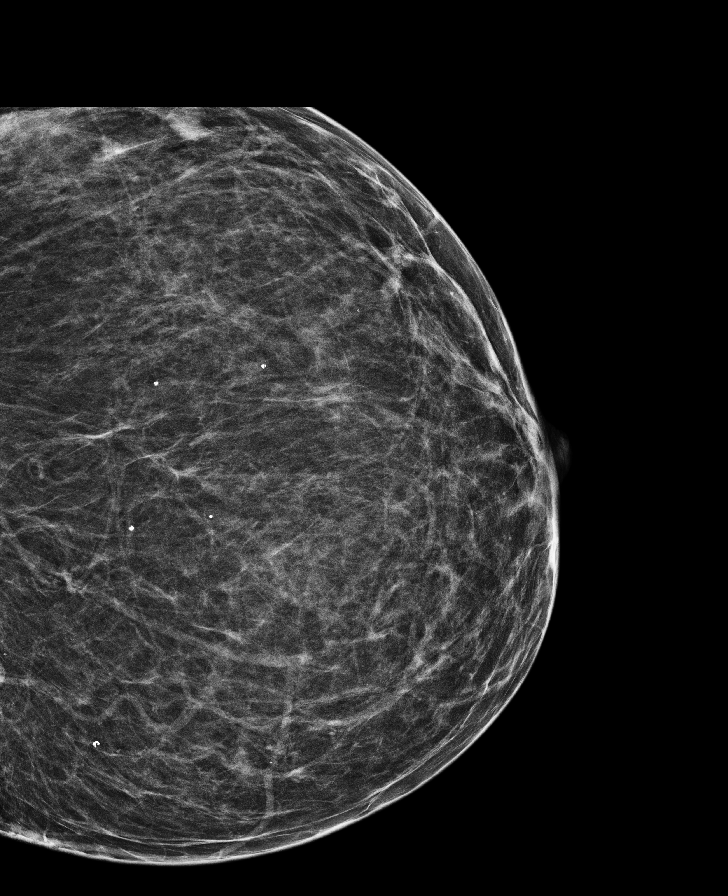

[L MLO (1 of 2)]
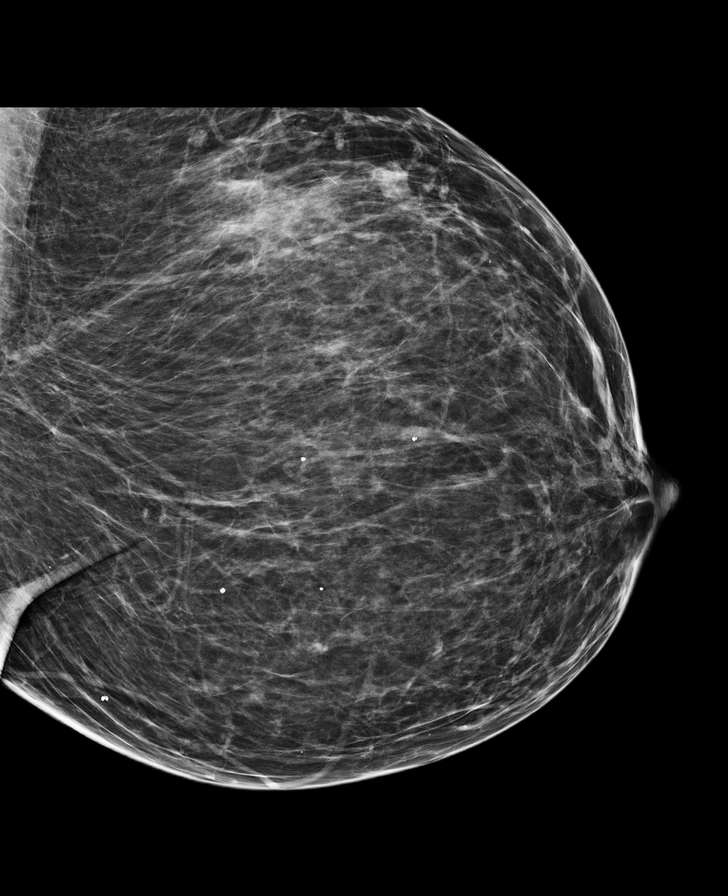

[R CC synth-2D]
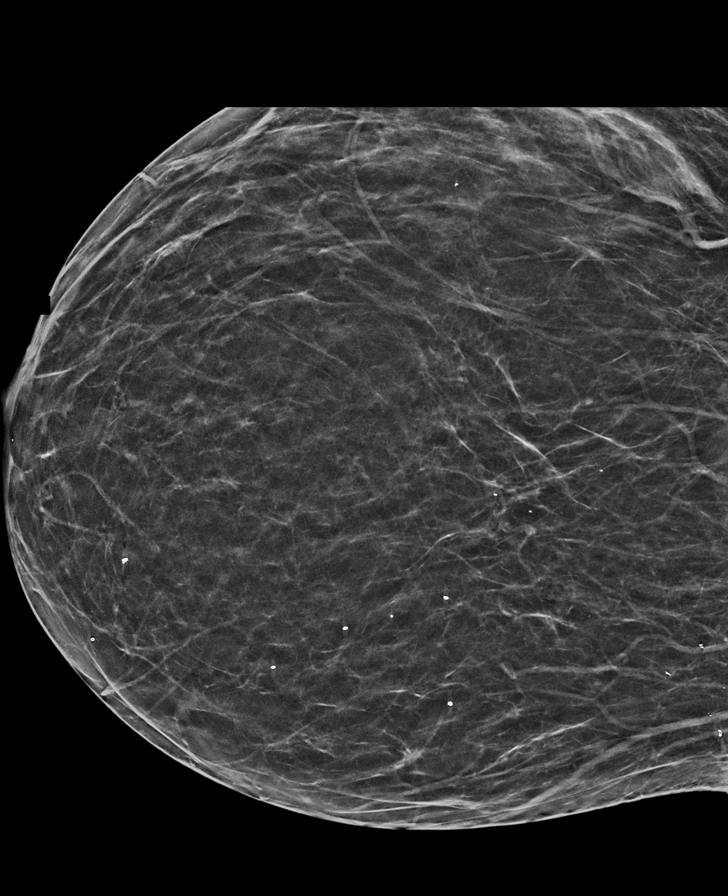

[L CC synth-2D]
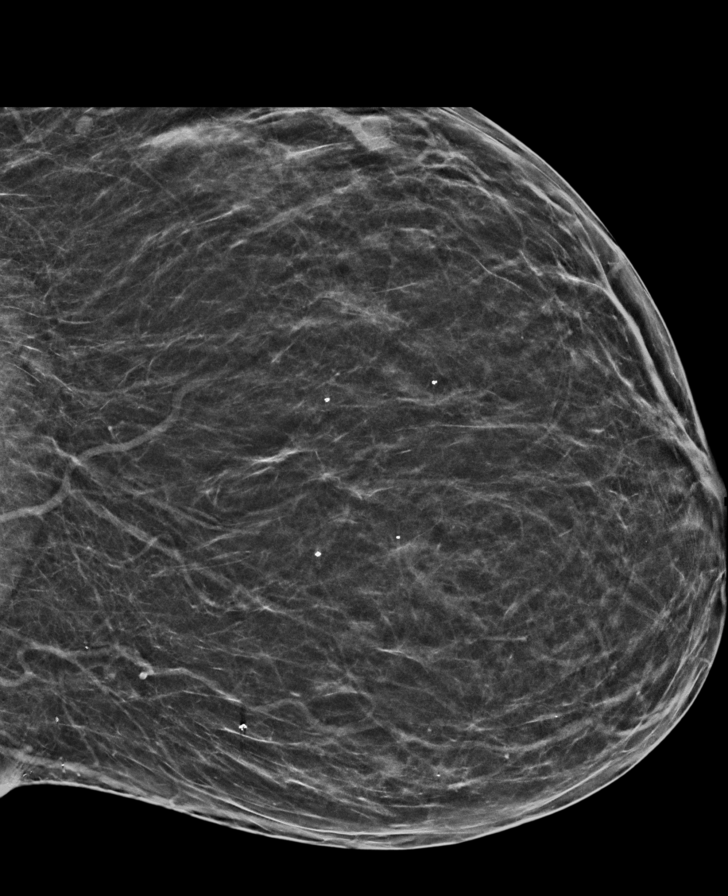

[R MLO synth-2D]
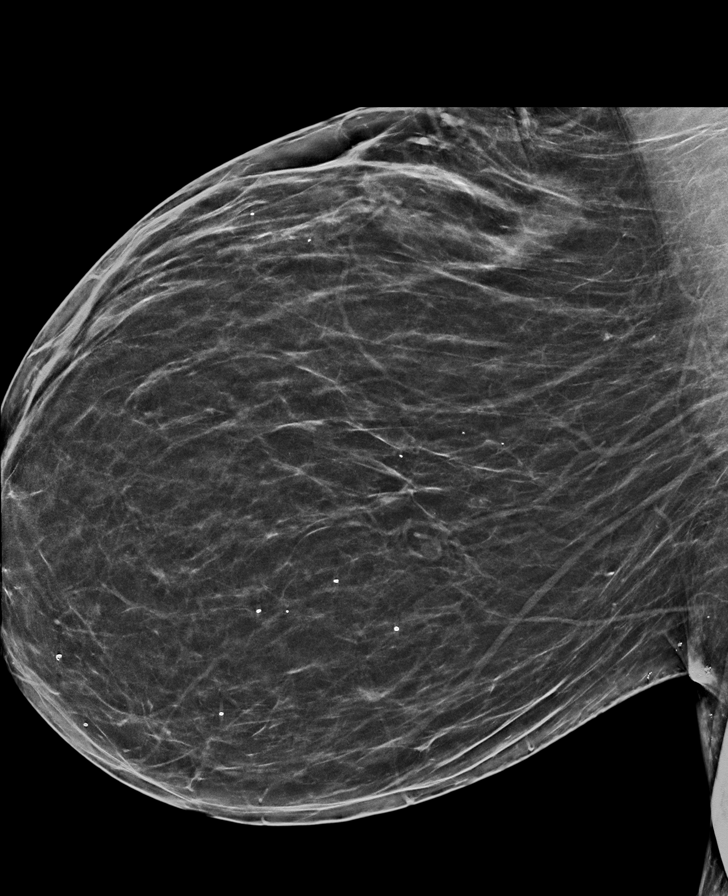

[R CC]
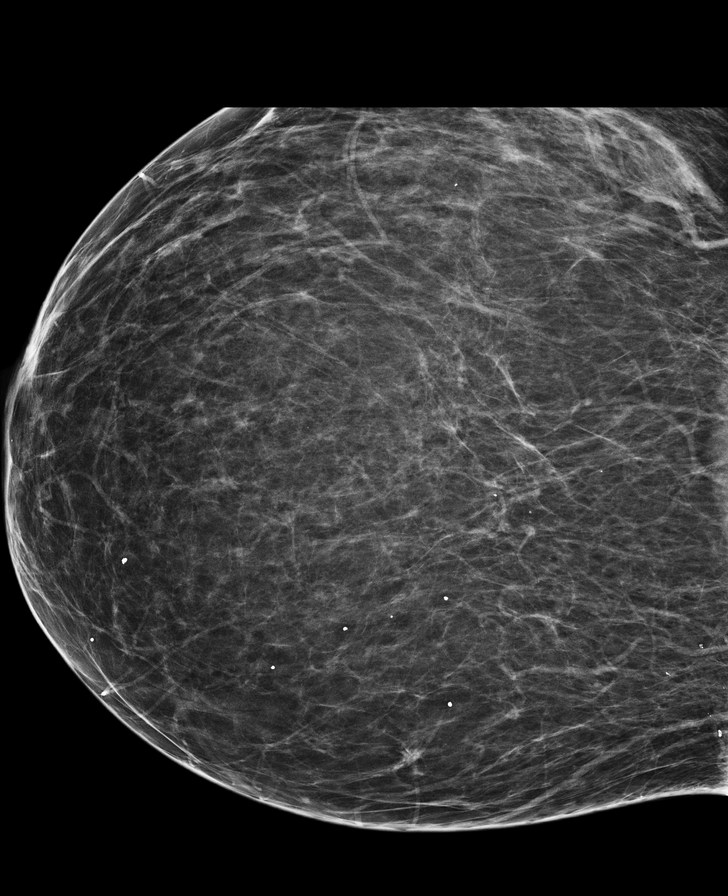

[L MLO (2 of 2)]
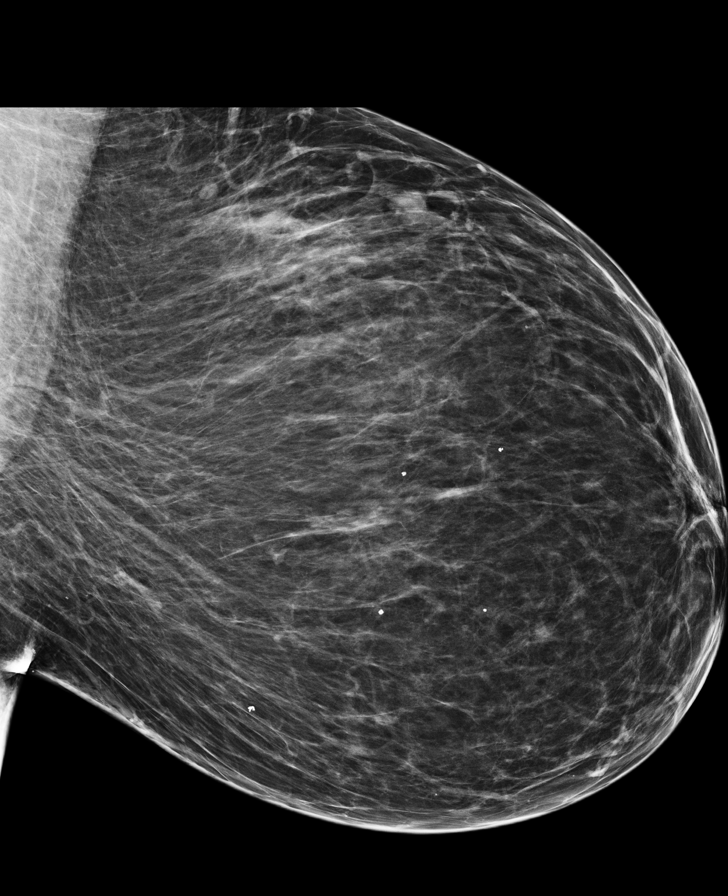

[8 of 31 positions shown; findings below may reference images not displayed]

ACR Breast Density Category b: There are scattered areas of
fibroglandular density.
FINDINGS: A small mass in the upper-outer quadrant of the left breast,
posteriorly, measures 1.3 cm in maximum diameter today and measured
1.0 cm in maximum diameter on the mammogram obtained at [REDACTED] on 05/03/2011. On the left breast ultrasound obtained at
[REDACTED] on 05/06/2011, this was an oval, horizontally
oriented, circumscribed, medium echotexture mass. There are no
findings suspicious for malignancy. Images were processed with CAD.
IMPRESSION: Mild increase in size of a fibroadenoma in the upper-outer quadrant
of the left breast. No mammographic evidence of malignancy. A result
letter of this screening mammogram will be mailed directly to the
patient.

RECOMMENDATION:
Screening mammogram in one year. (Code:[id number])

BI-RADS CATEGORY  2: Benign.

## 2018-01-30 ENCOUNTER — Ambulatory Visit (INDEPENDENT_AMBULATORY_CARE_PROVIDER_SITE_OTHER): Payer: Medicare Other | Admitting: Psychology

## 2018-01-30 DIAGNOSIS — F331 Major depressive disorder, recurrent, moderate: Secondary | ICD-10-CM | POA: Diagnosis not present

## 2018-02-05 ENCOUNTER — Encounter: Payer: Self-pay | Admitting: Family Medicine

## 2018-02-05 ENCOUNTER — Ambulatory Visit: Payer: Self-pay | Admitting: Psychology

## 2018-02-13 ENCOUNTER — Ambulatory Visit: Payer: Medicare Other | Admitting: Psychology

## 2018-02-19 ENCOUNTER — Ambulatory Visit: Payer: Self-pay | Admitting: Psychology

## 2018-02-27 ENCOUNTER — Ambulatory Visit (INDEPENDENT_AMBULATORY_CARE_PROVIDER_SITE_OTHER): Payer: Medicare Other | Admitting: Psychology

## 2018-02-27 DIAGNOSIS — F331 Major depressive disorder, recurrent, moderate: Secondary | ICD-10-CM

## 2018-02-27 DIAGNOSIS — M797 Fibromyalgia: Secondary | ICD-10-CM | POA: Diagnosis not present

## 2018-02-27 DIAGNOSIS — H5213 Myopia, bilateral: Secondary | ICD-10-CM | POA: Diagnosis not present

## 2018-03-05 ENCOUNTER — Ambulatory Visit: Payer: Self-pay | Admitting: Psychology

## 2018-03-08 ENCOUNTER — Encounter: Payer: Self-pay | Admitting: Family Medicine

## 2018-03-08 DIAGNOSIS — M5440 Lumbago with sciatica, unspecified side: Principal | ICD-10-CM

## 2018-03-08 DIAGNOSIS — G8929 Other chronic pain: Secondary | ICD-10-CM

## 2018-03-11 NOTE — Telephone Encounter (Signed)
Referral to PT for back pain /sciatica  Will route to [facility]

## 2018-03-13 ENCOUNTER — Ambulatory Visit: Payer: Self-pay | Admitting: Psychology

## 2018-03-15 ENCOUNTER — Other Ambulatory Visit: Payer: Self-pay | Admitting: Family Medicine

## 2018-03-15 NOTE — Telephone Encounter (Signed)
Last filled on 03/03/17 #180 caps with 11 refills, please advise

## 2018-03-19 ENCOUNTER — Telehealth: Payer: Self-pay | Admitting: Family Medicine

## 2018-03-19 ENCOUNTER — Ambulatory Visit: Payer: Self-pay | Admitting: Psychology

## 2018-03-19 DIAGNOSIS — D696 Thrombocytopenia, unspecified: Secondary | ICD-10-CM

## 2018-03-19 DIAGNOSIS — R7309 Other abnormal glucose: Secondary | ICD-10-CM

## 2018-03-19 DIAGNOSIS — E049 Nontoxic goiter, unspecified: Secondary | ICD-10-CM

## 2018-03-19 DIAGNOSIS — I10 Essential (primary) hypertension: Secondary | ICD-10-CM

## 2018-03-19 DIAGNOSIS — E538 Deficiency of other specified B group vitamins: Secondary | ICD-10-CM

## 2018-03-19 DIAGNOSIS — E559 Vitamin D deficiency, unspecified: Secondary | ICD-10-CM

## 2018-03-19 NOTE — Telephone Encounter (Signed)
-----   Message from Eustace Pen, LPN sent at 04/18/121  2:50 PM EDT ----- Regarding: Labs 5/21 Lab orders needed. Thank you.  Insurance:  [employer]

## 2018-03-20 ENCOUNTER — Ambulatory Visit: Payer: Medicare Other

## 2018-03-21 ENCOUNTER — Ambulatory Visit (INDEPENDENT_AMBULATORY_CARE_PROVIDER_SITE_OTHER): Payer: Medicare Other

## 2018-03-21 VITALS — BP 150/110 | HR 74 | Temp 97.7°F | Ht 62.5 in | Wt 203.2 lb

## 2018-03-21 DIAGNOSIS — I10 Essential (primary) hypertension: Secondary | ICD-10-CM

## 2018-03-21 DIAGNOSIS — Z0000 Encounter for general adult medical examination without abnormal findings: Secondary | ICD-10-CM | POA: Diagnosis not present

## 2018-03-21 LAB — LIPID PANEL
CHOL/HDL RATIO: 2
CHOLESTEROL: 129 mg/dL (ref 0–200)
HDL: 58.4 mg/dL (ref >39.00)
LDL CALC: 61 mg/dL (ref 0–99)
NonHDL: 70.25
TRIGLYCERIDES: 45 mg/dL (ref 0.0–149.0)
VLDL: 9 mg/dL (ref 0.0–40.0)

## 2018-03-21 NOTE — Progress Notes (Signed)
PCP notes:   Health maintenance:  No gaps identified.   Abnormal screenings:   Depression score: 12 Depression screen [facility] 2/9 03/21/2018 10/21/2016 01/27/2016  Decreased Interest 3 3 0  Down, Depressed, Hopeless 2 2 0  PHQ - 2 Score 5 5 0  Altered sleeping 3 3 -  Tired, decreased energy 2 2 -  Change in appetite 0 0 -  Feeling bad or failure about yourself  1 1 -  Trouble concentrating 1 1 -  Moving slowly or fidgety/restless 0 0 -  Suicidal thoughts 0 0 -  PHQ-9 Score 12 12 -  Difficult doing work/chores Somewhat difficult Very difficult -   Patient concerns:   None  Nurse concerns:  Patient's BP was elevated. BP reading was 150/110. Patient verbalized she has not taken BP medication in the previous 2 weeks. PCP notified.  Next PCP appt:   03/27/18 @ 0830  I reviewed health advisor's note, was available for consultation, and agree with documentation and plan. Loura Pardon MD

## 2018-03-21 NOTE — Patient Instructions (Signed)
Dana Greer , Thank you for taking time to come for your Medicare Wellness Visit. I appreciate your ongoing commitment to your health goals. Please review the following plan we discussed and let me know if I can assist you in the future.   These are the goals we discussed: Goals    . Patient Stated     Starting 03/21/2018, I will continue to meditate and to do yoga for 60 minutes when pain level is above 5.        This is a list of the screening recommended for you and due dates:  Health Maintenance  Topic Date Due  . Tetanus Vaccine  10/21/2026*  . Flu Shot  05/31/2018  . Mammogram  11/15/2018  . HIV Screening  Completed  *Topic was postponed. The date shown is not the original due date.   Preventive Care for Adults  A healthy lifestyle and preventive care can promote health and wellness. Preventive health guidelines for adults include the following key practices.  . A routine yearly physical is a good way to check with your health care provider about your health and preventive screening. It is a chance to share any concerns and updates on your health and to receive a thorough exam.  . Visit your dentist for a routine exam and preventive care every 6 months. Brush your teeth twice a day and floss once a day. Good oral hygiene prevents tooth decay and gum disease.  . The frequency of eye exams is based on your age, health, family medical history, use  of contact lenses, and other factors. Follow your health care provider's recommendations for frequency of eye exams.  . Eat a healthy diet. Foods like vegetables, fruits, whole grains, low-fat dairy products, and lean protein foods contain the nutrients you need without too many calories. Decrease your intake of foods high in solid fats, added sugars, and salt. Eat the right amount of calories for you. Get information about a proper diet from your health care provider, if necessary.  . Regular physical exercise is one of the most  important things you can do for your health. Most adults should get at least 150 minutes of moderate-intensity exercise (any activity that increases your heart rate and causes you to sweat) each week. In addition, most adults need muscle-strengthening exercises on 2 or more days a week.  Silver Sneakers may be a benefit available to you. To determine eligibility, you may visit the website: www.silversneakers.com or contact program at (805)177-7322 Mon-Fri between 8AM-8PM.   . Maintain a healthy weight. The body mass index (BMI) is a screening tool to identify possible weight problems. It provides an estimate of body fat based on height and weight. Your health care provider can find your BMI and can help you achieve or maintain a healthy weight.   For adults 20 years and older: ? A BMI below 18.5 is considered underweight. ? A BMI of 18.5 to 24.9 is normal. ? A BMI of 25 to 29.9 is considered overweight. ? A BMI of 30 and above is considered obese.   . Maintain normal blood lipids and cholesterol levels by exercising and minimizing your intake of saturated fat. Eat a balanced diet with plenty of fruit and vegetables. Blood tests for lipids and cholesterol should begin at age 58 and be repeated every 5 years. If your lipid or cholesterol levels are high, you are over 50, or you are at high risk for heart disease, you may need your  cholesterol levels checked more frequently. Ongoing high lipid and cholesterol levels should be treated with medicines if diet and exercise are not working.  . If you smoke, find out from your health care provider how to quit. If you do not use tobacco, please do not start.  . If you choose to drink alcohol, please do not consume more than 2 drinks per day. One drink is considered to be 12 ounces (355 mL) of beer, 5 ounces (148 mL) of wine, or 1.5 ounces (44 mL) of liquor.  . If you are 63-5 years old, ask your health care provider if you should take aspirin to prevent  strokes.  . Use sunscreen. Apply sunscreen liberally and repeatedly throughout the day. You should seek shade when your shadow is shorter than you. Protect yourself by wearing long sleeves, pants, a wide-brimmed hat, and sunglasses year round, whenever you are outdoors.  . Once a month, do a whole body skin exam, using a mirror to look at the skin on your back. Tell your health care provider of new moles, moles that have irregular borders, moles that are larger than a pencil eraser, or moles that have changed in shape or color.

## 2018-03-21 NOTE — Progress Notes (Signed)
Subjective:   Dana Greer is a 47 y.o. female who presents for Medicare Annual (Subsequent) preventive examination.  Review of Systems:  N/A Cardiac Risk Factors include: obesity (BMI >30kg/m2);hypertension     Objective:     Vitals: BP (!) 150/110 (BP Location: Right Arm, Patient Position: Sitting, Cuff Size: Normal) Comment: no BP medication taken  Pulse 74   Temp 97.7 F (36.5 C) (Oral)   Ht 5' 2.5" (1.588 m) Comment: no shoes  Wt 203 lb 4 oz (92.2 kg)   SpO2 97%   BMI 36.58 kg/m   Body mass index is 36.58 kg/m.  Advanced Directives 03/21/2018 10/31/2017 11/22/2016 11/21/2016 10/21/2016 07/05/2016 03/31/2016  Does Patient Have a Medical Advance Directive? No No No No Yes No No  Type of Advance Directive - - - - [place] in Chart? - - - - No - copy requested - -  Would patient like information on creating a medical advance directive? No - Patient declined - No - Patient declined - - - No - patient declined information    Tobacco Social History   Tobacco Use  Smoking Status Never Smoker  Smokeless Tobacco Never Used  Tobacco Comment   Pt states she smokes.....but "not cigarettes"     Counseling given: No Comment: Pt states she smokes.....but "not cigarettes"   Clinical Intake:  Pre-visit preparation completed: Yes  Pain : 0-10 Pain Score: 8  Pain Type: Chronic pain Pain Location: Other (Comment)(sciatica)     Nutritional Status: BMI > 30  Obese Nutritional Risks: None Diabetes: No  How often do you need to have someone help you when you read instructions, pamphlets, or other written materials from your doctor or pharmacy?: 1 - Never What is the last grade level you completed in school?: 12th grade + 3.5 yrs college  Interpreter Needed?: No  Comments: pt lives alone Information entered by :: LPinson, LPN  Past Medical History:  Diagnosis Date  . Arthritis   . Depression   .  Headache(784.0)   . HSV-2 infection   . Immune deficiency disorder ([place])   . Stroke ([place])   . Thrombocytopenia ([place]) 10/01/2012   Past Surgical History:  Procedure Laterality Date  . ABDOMINAL HYSTERECTOMY    . CESAREAN SECTION  2008   x1  . PARTIAL HYSTERECTOMY  2012   due to fibroid  . UPPER GASTROINTESTINAL ENDOSCOPY    . WISDOM TOOTH EXTRACTION  1995   Family History  Problem Relation Age of Onset  . Diabetes Father   . Breast cancer Paternal Aunt   . Colon cancer Paternal Grandmother    Social History   Socioeconomic History  . Marital status: Legally Separated    Spouse name: Not on file  . Number of children: 3  . Years of education: Not on file  . Highest education level: Not on file  Occupational History  . Occupation: Editor, commissioning, Artist: [employer]  Social Needs  . Financial resource strain: Not on file  . Food insecurity:    Worry: Not on file    Inability: Not on file  . Transportation needs:    Medical: Not on file    Non-medical: Not on file  Tobacco Use  . Smoking status: Never Smoker  . Smokeless tobacco: Never Used  . Tobacco comment: Pt states she smokes.....but "not cigarettes"  Substance and Sexual Activity  . Alcohol use: Yes  Comment: occ  . Drug use: Yes    Types: Marijuana  . Sexual activity: Yes    Birth control/protection: None  Lifestyle  . Physical activity:    Days per week: Not on file    Minutes per session: Not on file  . Stress: Not on file  Relationships  . Social connections:    Talks on phone: Not on file    Gets together: Not on file    Attends religious service: Not on file    Active member of club or organization: Not on file    Attends meetings of clubs or organizations: Not on file    Relationship status: Not on file  Other Topics Concern  . Not on file  Social History Narrative  . Not on file    Outpatient Encounter Medications as of 03/21/2018  Medication Sig  . ALPRAZolam (XANAX) 1 MG tablet  Take 1 mg by mouth QID.   Marland Kitchen amitriptyline (ELAVIL) 25 MG tablet Take 25 mg by mouth daily.  . cloNIDine (CATAPRES) 0.2 MG tablet Take 0.2 mg by mouth daily.  Marland Kitchen dicyclomine (BENTYL) 10 MG capsule Take 1 capsule (10 mg total) by mouth daily as needed for spasms.  Marland Kitchen etonogestrel (IMPLANON) 68 MG IMPL implant Inject 1 each into the skin continuous. Reported on 01/27/2016  . fluticasone (FLONASE) 50 MCG/ACT nasal spray Place 2 sprays into both nostrils daily.  Marland Kitchen gabapentin (NEURONTIN) 300 MG capsule TAKE 1 OR 2 CAPSULES BY MOUTH 3 TIMES A DAY  . meclizine (ANTIVERT) 25 MG tablet Take 25 mg by mouth 3 (three) times daily as needed for dizziness.  . ondansetron (ZOFRAN ODT) 4 MG disintegrating tablet Take 1 tablet (4 mg total) by mouth every 8 (eight) hours as needed for nausea or vomiting.  Marland Kitchen PARoxetine (PAXIL) 40 MG tablet Take 1 tablet (40 mg total) by mouth daily.  . promethazine (PHENERGAN) 25 MG tablet TAKE ONE TABLET BY MOUTH EVERY 8 HOURS AS NEEDED FOR NAUSEA AND VOMITING (Patient taking differently: TAKE 25 mg  TABLET BY MOUTH EVERY 8 HOURS AS NEEDED FOR NAUSEA AND VOMITING)  . ranitidine (ZANTAC) 75 MG tablet Take 150 mg by mouth every morning.   Marland Kitchen tetrahydrozoline 0.05 % ophthalmic solution Place 1 drop into both eyes daily as needed (for dry eyes).  . valACYclovir (VALTREX) 1000 MG tablet Take 1 tablet (1,000 mg total) by mouth 3 (three) times daily.  . valACYclovir (VALTREX) 500 MG tablet Take 1 tablet (500 mg total) by mouth daily.  . Vitamin D, Ergocalciferol, (DRISDOL) 50000 units CAPS capsule Take 1 capsule (50,000 Units total) by mouth every 7 (seven) days. For 12 weeks  . [DISCONTINUED] methocarbamol (ROBAXIN) 500 MG tablet Take 1 tablet (500 mg total) by mouth every 8 (eight) hours as needed for muscle spasms (or Migraine).   No facility-administered encounter medications on file as of 03/21/2018.     Activities of Daily Living In your present state of health, do you have any  difficulty performing the following activities: 03/21/2018  Hearing? N  Vision? N  Difficulty concentrating or making decisions? Y  Walking or climbing stairs? N  Dressing or bathing? N  Doing errands, shopping? N  Preparing Food and eating ? N  Using the Toilet? N  In the past six months, have you accidently leaked urine? Y  Do you have problems with loss of bowel control? N  Managing your Medications? N  Managing your Finances? N  Housekeeping or managing your Housekeeping? N  Some recent data might be hidden    Patient Care Team: [name], Wynelle Fanny, MD as PCP - General (Family Medicine) Marcial Pacas, MD as Attending Physician (Neurology)    Assessment:   This is a routine wellness examination for [place].   Hearing Screening   125Hz  250Hz  500Hz  1000Hz  2000Hz  3000Hz  4000Hz  6000Hz  8000Hz   Right ear:   40 40 40  40    Left ear:   40 40 40  40    Vision Screening Comments: Vision exam in May 2019 @ [facility]    Exercise Activities and Dietary recommendations Current Exercise Habits: Home exercise routine, Type of exercise: yoga;calisthenics;strength training/weights, Time (Minutes): 60, Frequency (Times/Week): 7, Weekly Exercise (Minutes/Week): 420, Intensity: Moderate, Exercise limited by: None identified  Goals    . Patient Stated     Starting 03/21/2018, I will continue to meditate and to do yoga for 60 minutes when pain level is above 5.        Fall Risk Fall Risk  03/21/2018 10/21/2016 01/27/2016  Falls in the past year? No No No   Depression Screen PHQ 2/9 Scores 03/21/2018 10/21/2016 01/27/2016  PHQ - 2 Score 5 5 0  PHQ- 9 Score 12 12 -     Cognitive Function MMSE - Mini Mental State Exam 03/21/2018 10/21/2016  Orientation to time 5 5  Orientation to Place 5 5  Registration 3 3  Attention/ Calculation 0 0  Recall 3 3  Language- name 2 objects 0 0  Language- repeat 1 1  Language- follow 3 step command 3 3  Language- read & follow direction 0 0  Write a  sentence 0 0  Copy design 0 0  Total score 20 20     PLEASE NOTE: A Mini-Cog screen was completed. Maximum score is 20. A value of 0 denotes this part of [place] MMSE was not completed or the patient failed this part of the Mini-Cog screening.   Mini-Cog Screening Orientation to Time - Max 5 pts Orientation to Place - Max 5 pts Registration - Max 3 pts Recall - Max 3 pts Language Repeat - Max 1 pts Language Follow 3 Step Command - Max 3 pts     Immunization History  Administered Date(s) Administered  . Influenza,inj,Quad PF,6+ Mos 11/25/2015, 10/25/2016, 01/09/2018    Screening Tests Health Maintenance  Topic Date Due  . TETANUS/TDAP  10/21/2026 (Originally 09/18/1989)  . INFLUENZA VACCINE  05/31/2018  . MAMMOGRAM  11/15/2018  . HIV Screening  Completed       Plan:     I have personally reviewed, addressed, and noted the following in the patient's chart:  A. Medical and social history B. Use of alcohol, tobacco or illicit drugs  C. Current medications and supplements D. Functional ability and status E.  Nutritional status F.  Physical activity G. Advance directives H. List of other physicians I.  Hospitalizations, surgeries, and ER visits in previous 12 months J.  [place] to include hearing, vision, cognitive, depression L. Referrals and appointments - none  In addition, I have reviewed and discussed with patient certain preventive protocols, quality metrics, and best practice recommendations. A written personalized care plan for preventive services as well as general preventive health recommendations were provided to patient.  See attached scanned questionnaire for additional information.   Signed,   Lindell Noe, MHA, BS, LPN Health Coach

## 2018-03-23 ENCOUNTER — Encounter: Payer: Self-pay | Admitting: Family Medicine

## 2018-03-27 ENCOUNTER — Ambulatory Visit (INDEPENDENT_AMBULATORY_CARE_PROVIDER_SITE_OTHER): Payer: Medicare Other | Admitting: Psychology

## 2018-03-27 ENCOUNTER — Encounter: Payer: Self-pay | Admitting: Family Medicine

## 2018-03-27 ENCOUNTER — Ambulatory Visit (INDEPENDENT_AMBULATORY_CARE_PROVIDER_SITE_OTHER): Payer: Medicare Other | Admitting: Family Medicine

## 2018-03-27 VITALS — BP 128/76 | HR 82 | Temp 99.1°F | Ht 62.5 in | Wt 208.0 lb

## 2018-03-27 DIAGNOSIS — Z13220 Encounter for screening for lipoid disorders: Secondary | ICD-10-CM

## 2018-03-27 DIAGNOSIS — N8110 Cystocele, unspecified: Secondary | ICD-10-CM

## 2018-03-27 DIAGNOSIS — G8929 Other chronic pain: Secondary | ICD-10-CM

## 2018-03-27 DIAGNOSIS — F4323 Adjustment disorder with mixed anxiety and depressed mood: Secondary | ICD-10-CM | POA: Diagnosis not present

## 2018-03-27 DIAGNOSIS — F331 Major depressive disorder, recurrent, moderate: Secondary | ICD-10-CM | POA: Diagnosis not present

## 2018-03-27 DIAGNOSIS — M7918 Myalgia, other site: Secondary | ICD-10-CM | POA: Diagnosis not present

## 2018-03-27 DIAGNOSIS — R7309 Other abnormal glucose: Secondary | ICD-10-CM

## 2018-03-27 DIAGNOSIS — M5440 Lumbago with sciatica, unspecified side: Secondary | ICD-10-CM | POA: Diagnosis not present

## 2018-03-27 DIAGNOSIS — I10 Essential (primary) hypertension: Secondary | ICD-10-CM | POA: Diagnosis not present

## 2018-03-27 DIAGNOSIS — E538 Deficiency of other specified B group vitamins: Secondary | ICD-10-CM | POA: Diagnosis not present

## 2018-03-27 DIAGNOSIS — E559 Vitamin D deficiency, unspecified: Secondary | ICD-10-CM

## 2018-03-27 MED ORDER — PROMETHAZINE HCL 25 MG PO TABS: ORAL_TABLET | ORAL | 5 refills | Status: DC

## 2018-03-27 NOTE — Assessment & Plan Note (Signed)
bp in fair control at this time  BP Readings from Last 1 Encounters:  03/27/18 128/76   No changes needed Most recent labs reviewed  Disc lifstyle change with low sodium diet and exercise  Pt was taking clonidine bid instead of qd but wants to return to QD

## 2018-03-27 NOTE — Assessment & Plan Note (Signed)
With frequent urination Has seen urology  mybertreq helps but too sedating

## 2018-03-27 NOTE — Assessment & Plan Note (Signed)
Pt missed last appt with ortho (states the emailed her instead of calling) Will work on re ref for this

## 2018-03-27 NOTE — Assessment & Plan Note (Signed)
Enc her to continue daily vit D Disc imp to bone and overall health

## 2018-03-27 NOTE — Progress Notes (Signed)
Subjective:    Patient ID: Dana Greer, female    DOB: Oct 23, 1971, 47 y.o.   MRN: [id number]  HPI  Here for annual f/u of chronic medical problems   Waiting on ortho appt - missed appt because they emailed her instead of calling  In a lot of pain (sciatica)  Used tens machine and heat  Epsom salt baths Dana Greer  Water/arthritis exercise    Really tired -having trouble sleeping    Wt Readings from Last 3 Encounters:  03/27/18 208 lb (94.3 kg)  03/21/18 203 lb 4 oz (92.2 kg)  01/09/18 203 lb 4 oz (92.2 kg)   37.44 kg/m   Had amw on 5/22 Depression score is 12  (suffers from chronic pain)  Sees psychiatry for depression and anxiety  bp was elevated -but she had not taken her medication   Mammogram 1/19 -normal  Self breast exam no lumps   Has implanon implant  Hx of PCOS  bp is stable today  No cp or palpitations or headaches or edema  No side effects to medicines  BP Readings from Last 3 Encounters:  03/27/18 128/76  03/21/18 (!) 150/110  01/09/18 (!) 142/78    bp is better with medication  Takes catapres 0.2- was on bid  Worse headaches lately - may be due to her pain  Has family hx of high blood pressure     Lab Results  Component Value Date   CREATININE 1.06 01/09/2018   BUN 12 01/09/2018   NA 139 01/09/2018   K 4.1 01/09/2018   CL 105 01/09/2018   CO2 28 01/09/2018    Lab Results  Component Value Date   ALT 21 10/31/2017   AST 35 10/31/2017   ALKPHOS 50 10/31/2017   BILITOT 1.1 10/31/2017   Lab Results  Component Value Date   WBC 10.9 (H) 10/31/2017   HGB 14.3 10/31/2017   HCT 40.6 10/31/2017   MCV 94.9 10/31/2017   PLT 160 10/31/2017   Lab Results  Component Value Date   TSH 0.90 01/09/2018    H/o B12 def Lab Results  Component Value Date   VITAMINB12 287 01/09/2018  taking one B12 pill per day otc  1000 mcg pill     Hyperglycemia Lab Results  Component Value Date   HGBA1C 5.4 01/09/2018   Good control  currently  Diet varies- got off track and wants to make healthier choices  Also water exercise    H/o chronic joint/muscle/myofasical pain  Takes gabapentin for this  . Cholesterol  Lab Results  Component Value Date   CHOL 129 03/21/2018   CHOL 101 10/21/2016   CHOL 100 07/25/2007   Lab Results  Component Value Date   HDL 58.40 03/21/2018   HDL 44.50 10/21/2016   HDL 39.5 07/25/2007   Lab Results  Component Value Date   LDLCALC 61 03/21/2018   LDLCALC 49 10/21/2016   LDLCALC 54 07/25/2007   Lab Results  Component Value Date   TRIG 45.0 03/21/2018   TRIG 37.0 10/21/2016   TRIG 31 07/25/2007   Lab Results  Component Value Date   CHOLHDL 2 03/21/2018   CHOLHDL 2 10/21/2016   CHOLHDL 2.5 CALC 07/25/2007   No results found for: LDLDIRECT Good profile Diet is fair    Sees Dr Lajoyce Corners for psychiatry  On elavil  paxil   Chronic n/v (may be related to migraine)  Needs refill of promethazine   Chronic frequent urination  Bladder prolapse  Has seen urology  mybertreq makes her too sleepy   Patient Active Problem List   Diagnosis Date Noted  . B12 deficiency 01/10/2018  . Foot pain, bilateral 01/09/2018  . Hand paresthesia 01/09/2018  . Vitamin D deficiency 01/09/2018  . Left buttock pain 11/07/2017  . Breast pain, left 11/02/2017  . Chronic myofascial pain 10/26/2016  . Screening mammogram, encounter for 10/25/2016  . Screening for lipoid disorders 10/20/2016  . HSV-2 infection 08/17/2016  . Nausea & vomiting 03/08/2016  . Right knee pain 01/27/2016  . Adverse effects of medication 11/25/2015  . Vertigo 10/08/2015  . Pain of right thumb 03/16/2015  . Carpal tunnel syndrome 03/16/2015  . Left hip pain 02/17/2015  . Shoulder pain, left 02/17/2015  . Knee pain, bilateral 01/07/2015  . Gout 12/22/2014  . Visit for routine gyn exam 08/04/2014  . Visit for screening mammogram 08/04/2014  . Pain, joint, multiple sites 05/26/2014  . Vertiginous migraine  02/03/2014  . Intractable migraine 08/31/2012  . Osteoarthritis 05/11/2012  . Female bladder prolapse 05/11/2012  . ADJ DISORDER WITH MIXED ANXIETY & DEPRESSED MOOD 06/30/2008  . HYPERHIDROSIS 01/25/2008  . BACK PAIN, LUMBAR, CHRONIC 10/16/2007  . REACTION, ACUTE STRESS W/EMOTIONAL DSTURB 08/01/2007  . HYPERGLYCEMIA 08/01/2007  . Essential hypertension 08/01/2007  . Goiter 07/11/2007  . POLYCYSTIC OVARIAN DISEASE 05/24/2007  . HIRSUTISM 05/24/2007  . Arthropathy, multiple sites 05/24/2007   Past Medical History:  Diagnosis Date  . Arthritis   . Depression   . Headache(784.0)   . HSV-2 infection   . Immune deficiency disorder ([place])   . Stroke ([place])   . Thrombocytopenia ([place]) 10/01/2012   Past Surgical History:  Procedure Laterality Date  . ABDOMINAL HYSTERECTOMY    . CESAREAN SECTION  2008   x1  . PARTIAL HYSTERECTOMY  2012   due to fibroid  . UPPER GASTROINTESTINAL ENDOSCOPY    . WISDOM TOOTH EXTRACTION  1995   Social History   Tobacco Use  . Smoking status: Never Smoker  . Smokeless tobacco: Never Used  . Tobacco comment: Pt states she smokes.....but "not cigarettes"  Substance Use Topics  . Alcohol use: Yes    Comment: occ  . Drug use: Yes    Types: Marijuana   Family History  Problem Relation Age of Onset  . Diabetes Father   . Breast cancer Paternal Aunt   . Colon cancer Paternal Grandmother    Allergies  Allergen Reactions  . Trazodone And Nefazodone Nausea And Vomiting  . Amoxicillin Other (See Comments)    Yeast infection   . Carafate [Sucralfate] Nausea And Vomiting   Current Outpatient Medications on File Prior to Visit  Medication Sig Dispense Refill  . ALPRAZolam (XANAX) 1 MG tablet Take 1 mg by mouth QID.     Marland Kitchen amitriptyline (ELAVIL) 25 MG tablet Take 25 mg by mouth daily.    . cloNIDine (CATAPRES) 0.2 MG tablet Take 0.2 mg by mouth daily.    Marland Kitchen dicyclomine (BENTYL) 10 MG capsule Take 1 capsule (10 mg total) by mouth daily as needed for  spasms.    Marland Kitchen etonogestrel (IMPLANON) 68 MG IMPL implant Inject 1 each into the skin continuous. Reported on 01/27/2016    . fluticasone (FLONASE) 50 MCG/ACT nasal spray Place 2 sprays into both nostrils daily. 16 g 6  . gabapentin (NEURONTIN) 300 MG capsule TAKE 1 OR 2 CAPSULES BY MOUTH 3 TIMES A DAY 180 capsule 5  . meclizine (ANTIVERT) 25 MG tablet Take 25 mg by  mouth 3 (three) times daily as needed for dizziness.    . ondansetron (ZOFRAN ODT) 4 MG disintegrating tablet Take 1 tablet (4 mg total) by mouth every 8 (eight) hours as needed for nausea or vomiting. 20 tablet 5  . PARoxetine (PAXIL) 40 MG tablet Take 1 tablet (40 mg total) by mouth daily. 30 tablet 11  . ranitidine (ZANTAC) 75 MG tablet Take 150 mg by mouth every morning.     Marland Kitchen tetrahydrozoline 0.05 % ophthalmic solution Place 1 drop into both eyes daily as needed (for dry eyes).    . valACYclovir (VALTREX) 1000 MG tablet Take 1 tablet (1,000 mg total) by mouth 3 (three) times daily. 21 tablet 0  . valACYclovir (VALTREX) 500 MG tablet Take 1 tablet (500 mg total) by mouth daily. 30 tablet 5  . Vitamin D, Ergocalciferol, (DRISDOL) 50000 units CAPS capsule Take 1 capsule (50,000 Units total) by mouth every 7 (seven) days. For 12 weeks 12 capsule 0   No current facility-administered medications on file prior to visit.     Review of Systems  Constitutional: Positive for fatigue. Negative for activity change, appetite change, fever and unexpected weight change.       On multiple sedating medicines   HENT: Negative for congestion, ear pain, rhinorrhea, sinus pressure and sore throat.   Eyes: Negative for pain, redness and visual disturbance.  Respiratory: Negative for cough, shortness of breath and wheezing.   Cardiovascular: Negative for chest pain and palpitations.  Gastrointestinal: Negative for abdominal pain, blood in stool, constipation and diarrhea.  Endocrine: Negative for polydipsia and polyuria.  Genitourinary: Positive for  frequency. Negative for dysuria and urgency.       Has overactive bladder  Has seen urology    Musculoskeletal: Positive for arthralgias, back pain and myalgias. Negative for joint swelling.  Skin: Negative for pallor and rash.  Allergic/Immunologic: Negative for environmental allergies.  Neurological: Negative for dizziness, syncope and headaches.  Hematological: Negative for adenopathy. Does not bruise/bleed easily.  Psychiatric/Behavioral: Positive for dysphoric mood and sleep disturbance. Negative for decreased concentration. The patient is nervous/anxious.        Seeing psychiatry for depression and anxiety  Chronic pain worsens this        Objective:   Physical Exam  Constitutional: She appears well-developed and well-nourished. No distress.  obese and well appearing   HENT:  Head: Normocephalic and atraumatic.  Right Ear: External ear normal.  Left Ear: External ear normal.  Mouth/Throat: Oropharynx is clear and moist.  Eyes: Pupils are equal, round, and reactive to light. Conjunctivae and EOM are normal. No scleral icterus.  Neck: Normal range of motion. Neck supple. No JVD present. Carotid bruit is not present. Thyromegaly present.  Baseline large symmetric thyroid   Cardiovascular: Normal rate, regular rhythm, normal heart sounds and intact distal pulses. Exam reveals no gallop.  Pulmonary/Chest: Effort normal and breath sounds normal. No respiratory distress. She has no wheezes. She exhibits no tenderness. No breast tenderness, discharge or bleeding.  Abdominal: Soft. Bowel sounds are normal. She exhibits no distension, no abdominal bruit and no mass. There is no tenderness.  Genitourinary: No breast tenderness, discharge or bleeding.  Genitourinary Comments: Breast exam: No mass, nodules, thickening, tenderness, bulging, retraction, inflamation, nipple discharge or skin changes noted.  No axillary or clavicular LA.    Large breasts/pendolous   Musculoskeletal: Normal  range of motion. She exhibits no edema or tenderness.  Lymphadenopathy:    She has no cervical adenopathy.  Neurological: She  is alert. She has normal reflexes. She displays normal reflexes. No cranial nerve deficit. She exhibits normal muscle tone. Coordination normal.  Skin: Skin is warm and dry. No rash noted. No erythema. No pallor.  Few skin tags  Psychiatric: Her speech is normal and behavior is normal. Thought content normal. Her affect is not blunt and not labile. She is not actively hallucinating. She exhibits a depressed mood.  Depressed affect today  Also in discomfort  Not tearful  She is attentive.          Assessment & Plan:   Problem List Items Addressed This Visit      Cardiovascular and Mediastinum   Essential hypertension - Primary    bp in fair control at this time  BP Readings from Last 1 Encounters:  03/27/18 128/76   No changes needed Most recent labs reviewed  Disc lifstyle change with low sodium diet and exercise  Pt was taking clonidine bid instead of qd but wants to return to QD         Genitourinary   Female bladder prolapse    With frequent urination Has seen urology  mybertreq helps but too sedating         Other   ADJ DISORDER WITH MIXED ANXIETY & DEPRESSED MOOD    Continues psychiatric f/u  Chronic pain worsens mood       B12 deficiency    Urged to continue her daily B12      BACK PAIN, LUMBAR, CHRONIC    Pt missed last appt with ortho (states the emailed her instead of calling) Will work on re ref for this       Chronic myofascial pain    Ongoing  Continues gabapentin      HYPERGLYCEMIA    Lab Results  Component Value Date   HGBA1C 5.4 01/09/2018   disc imp of low glycemic diet and wt loss to prevent DM2       Screening for lipoid disorders    Disc goals for lipids and reasons to control them Rev last labs with pt Rev low sat fat diet in detail Good profile      Vitamin D deficiency    Enc her to continue  daily vit D Disc imp to bone and overall health

## 2018-03-27 NOTE — Assessment & Plan Note (Signed)
Ongoing. Continues gabapentin.  

## 2018-03-27 NOTE — Assessment & Plan Note (Signed)
Urged to continue her daily B12

## 2018-03-27 NOTE — Assessment & Plan Note (Signed)
Lab Results  Component Value Date   HGBA1C 5.4 01/09/2018   disc imp of low glycemic diet and wt loss to prevent DM2

## 2018-03-27 NOTE — Assessment & Plan Note (Signed)
Continues psychiatric f/u  Chronic pain worsens mood

## 2018-03-27 NOTE — Assessment & Plan Note (Signed)
Disc goals for lipids and reasons to control them Rev last labs with pt Rev low sat fat diet in detail Good profile 

## 2018-03-27 NOTE — Patient Instructions (Signed)
Take care of yourself   Keep taking your vitamin B12 and vitamin D  We will work on the orthopedic referral   Continue seeing your psychiatrist   Continue water exercise if you can  Blood pressure

## 2018-04-03 DIAGNOSIS — M545 Low back pain: Secondary | ICD-10-CM | POA: Diagnosis not present

## 2018-04-05 ENCOUNTER — Encounter: Payer: Self-pay | Admitting: Family Medicine

## 2018-04-10 ENCOUNTER — Ambulatory Visit: Payer: Self-pay | Admitting: Psychology

## 2018-04-12 ENCOUNTER — Ambulatory Visit (INDEPENDENT_AMBULATORY_CARE_PROVIDER_SITE_OTHER): Payer: Medicare Other | Admitting: Neurology

## 2018-04-12 ENCOUNTER — Encounter: Payer: Self-pay | Admitting: Neurology

## 2018-04-12 VITALS — BP 150/102 | HR 73 | Ht 62.5 in | Wt 196.8 lb

## 2018-04-12 DIAGNOSIS — G43709 Chronic migraine without aura, not intractable, without status migrainosus: Secondary | ICD-10-CM

## 2018-04-12 DIAGNOSIS — IMO0002 Reserved for concepts with insufficient information to code with codable children: Secondary | ICD-10-CM

## 2018-04-12 DIAGNOSIS — R202 Paresthesia of skin: Secondary | ICD-10-CM | POA: Insufficient documentation

## 2018-04-12 MED ORDER — RIZATRIPTAN BENZOATE 5 MG PO TBDP: 5.0000 mg | ORAL_TABLET | ORAL | 12 refills | Status: DC | PRN

## 2018-04-12 MED ORDER — AMITRIPTYLINE HCL 25 MG PO TABS: 50.0000 mg | ORAL_TABLET | Freq: Every day | ORAL | 4 refills | Status: DC

## 2018-04-12 MED ORDER — ONDANSETRON 4 MG PO TBDP: 4.0000 mg | ORAL_TABLET | Freq: Three times a day (TID) | ORAL | 5 refills | Status: DC | PRN

## 2018-04-12 MED ORDER — TOPIRAMATE 100 MG PO TABS: 100.0000 mg | ORAL_TABLET | Freq: Two times a day (BID) | ORAL | 11 refills | Status: DC

## 2018-04-12 NOTE — Progress Notes (Signed)
PATIENT: Dana Greer DOB: 10-23-1971  Chief Complaint  Patient presents with  . Numbness/Pain    Reports bilateral foot pain and bilateral hand numbness.  Symptoms present for 2-3 years and have progressively become worse.  She ususally sleep with arm braces which are mildly helpful.    Marland Kitchen PCP    [name], Wynelle Fanny, MD     HISTORICAL  Dana Greer is a 47 year old female, seen in refer by primary care physician Dr. Glori Bickers, [facility] for evaluation of bilateral hands and feet paresthesia, initial evaluation was on April 12, 2018.  She has history of depression, taking Paxil 40 mg daily,  Since 2012,She has frequent headaches, bilateral occipital, frontal region severe pounding headache with associated light noise sensitivity, nauseous, lasting for hours to days, sometimes associated with vertigo, vomiting, sometimes woke up from sleep with vomiting, severe headaches,  She was given amitriptyline 25 mg every night, which has helped her symptoms some, she is not having migraine headaches about a couple times each month, but each episode can last for a few days, she has never tried triptan treatment,  MRI of the brain 2013 showed no acute abnormality, there was a T2 flair hyperintensity at the right thalamus suggestive of small vessel disease, she denies a history of stroke in the past,  Over the past couple years, she also complains of intermittent paresthesia involving bottom of feet, fingertips, getting worse over the past few weeks, difficulty  walking barefoot on the floor due to the sensitivity,  Laboratory evaluation in 2019 showed normal TSH, B12 287, vitamin D was only 7.1, A1c was 5.4, normal BMP, CBC showed mild elevated WBC 10.9, hemoglobin of 14.3,  REVIEW OF SYSTEMS: Full 14 system review of systems performed and notable only for fatigue, palpitation, spinning sensation, incontinence, easy bruising, feeling hot, flushing, joint pain, achy muscles, allergies,  confusion, headaches, numbness, weakness, dizziness, insomnia, depression, anxiety, not enough sleep  ALLERGIES: Allergies  Allergen Reactions  . Trazodone And Nefazodone Nausea And Vomiting  . Amoxicillin Other (See Comments)    Yeast infection   . Carafate [Sucralfate] Nausea And Vomiting    HOME MEDICATIONS: Current Outpatient Medications  Medication Sig Dispense Refill  . ALPRAZolam (XANAX) 1 MG tablet Take 1 mg by mouth QID.     Marland Kitchen amitriptyline (ELAVIL) 25 MG tablet Take 25 mg by mouth daily.    . cloNIDine (CATAPRES) 0.2 MG tablet Take 0.2 mg by mouth daily.    Marland Kitchen dicyclomine (BENTYL) 10 MG capsule Take 1 capsule (10 mg total) by mouth daily as needed for spasms.    Marland Kitchen etonogestrel (IMPLANON) 68 MG IMPL implant Inject 1 each into the skin continuous. Reported on 01/27/2016    . fluticasone (FLONASE) 50 MCG/ACT nasal spray Place 2 sprays into both nostrils daily. 16 g 6  . gabapentin (NEURONTIN) 300 MG capsule TAKE 1 OR 2 CAPSULES BY MOUTH 3 TIMES A DAY 180 capsule 5  . meclizine (ANTIVERT) 25 MG tablet Take 25 mg by mouth 3 (three) times daily as needed for dizziness.    . ondansetron (ZOFRAN ODT) 4 MG disintegrating tablet Take 1 tablet (4 mg total) by mouth every 8 (eight) hours as needed for nausea or vomiting. 20 tablet 5  . PARoxetine (PAXIL) 40 MG tablet Take 1 tablet (40 mg total) by mouth daily. 30 tablet 11  . promethazine (PHENERGAN) 25 MG tablet TAKE ONE TABLET BY MOUTH EVERY 8 HOURS AS NEEDED FOR NAUSEA AND VOMITING  30 tablet 5  . ranitidine (ZANTAC) 75 MG tablet Take 150 mg by mouth every morning.     Marland Kitchen tetrahydrozoline 0.05 % ophthalmic solution Place 1 drop into both eyes daily as needed (for dry eyes).    . valACYclovir (VALTREX) 500 MG tablet Take 1 tablet (500 mg total) by mouth daily. 30 tablet 5  . Vitamin D, Ergocalciferol, (DRISDOL) 50000 units CAPS capsule Take 1 capsule (50,000 Units total) by mouth every 7 (seven) days. For 12 weeks 12 capsule 0   No current  facility-administered medications for this visit.     PAST MEDICAL HISTORY: Past Medical History:  Diagnosis Date  . Arthritis   . Depression   . Fibromyalgia   . Headache(784.0)   . HSV-2 infection   . Immune deficiency disorder ([place])   . Shingles   . Stroke ([place])   . Thrombocytopenia ([place]) 10/01/2012    PAST SURGICAL HISTORY: Past Surgical History:  Procedure Laterality Date  . ABDOMINAL HYSTERECTOMY    . CESAREAN SECTION  2008   x1  . PARTIAL HYSTERECTOMY  2012   due to fibroid  . UPPER GASTROINTESTINAL ENDOSCOPY    . WISDOM TOOTH EXTRACTION  1995    FAMILY HISTORY: Family History  Problem Relation Age of Onset  . Diabetes Father   . COPD Father   . Healthy Mother   . Breast cancer Paternal Aunt   . Colon cancer Paternal Grandmother     SOCIAL HISTORY:  Social History   Socioeconomic History  . Marital status: Legally Separated    Spouse name: Not on file  . Number of children: 3  . Years of education: college  . Highest education level: Not on file  Occupational History  . Occupation: Unemployed  Social Needs  . Financial resource strain: Not on file  . Food insecurity:    Worry: Not on file    Inability: Not on file  . Transportation needs:    Medical: Not on file    Non-medical: Not on file  Tobacco Use  . Smoking status: Never Smoker  . Smokeless tobacco: Never Used  . Tobacco comment: Pt states she smokes.....but "not cigarettes"  Substance and Sexual Activity  . Alcohol use: Yes    Comment: occasional  . Drug use: Yes    Types: Marijuana    Comment: smokes small amount twice daily  . Sexual activity: Yes    Birth control/protection: None  Lifestyle  . Physical activity:    Days per week: Not on file    Minutes per session: Not on file  . Stress: Not on file  Relationships  . Social connections:    Talks on phone: Not on file    Gets together: Not on file    Attends religious service: Not on file    Active member of club or  organization: Not on file    Attends meetings of clubs or organizations: Not on file    Relationship status: Not on file  . Intimate partner violence:    Fear of current or ex partner: Not on file    Emotionally abused: Not on file    Physically abused: Not on file    Forced sexual activity: Not on file  Other Topics Concern  . Not on file  Social History Narrative   Lives at home with her children.   6 cups caffeine per day.   Right-handed.     PHYSICAL EXAM   Vitals:   04/12/18 1430  BP: (!) 150/102  Pulse: 73  Weight: 196 lb 12 oz (89.2 kg)  Height: 5' 2.5" (1.588 m)    Not recorded      Body mass index is 35.41 kg/m.  PHYSICAL EXAMNIATION:  Gen: NAD, conversant, well nourised, obese, well groomed                     Cardiovascular: Regular rate rhythm, no peripheral edema, warm, nontender. Eyes: Conjunctivae clear without exudates or hemorrhage Neck: Supple, no carotid bruits. Pulmonary: Clear to auscultation bilaterally   NEUROLOGICAL EXAM:  MENTAL STATUS: Speech:    Speech is normal; fluent and spontaneous with normal comprehension.  Cognition:     Orientation to time, place and person     Normal recent and remote memory     Normal Attention span and concentration     Normal Language, naming, repeating,spontaneous speech     Fund of knowledge   CRANIAL NERVES: CN II: Visual fields are full to confrontation. Fundoscopic exam is normal with sharp discs and no vascular changes. Pupils are round equal and briskly reactive to light. CN III, IV, VI: extraocular movement are normal. No ptosis. CN V: Facial sensation is intact to pinprick in all 3 divisions bilaterally. Corneal responses are intact.  CN VII: Face is symmetric with normal eye closure and smile. CN VIII: Hearing is normal to rubbing fingers CN IX, X: Palate elevates symmetrically. Phonation is normal. CN XI: Head turning and shoulder shrug are intact CN XII: Tongue is midline with normal  movements and no atrophy.  MOTOR: There is no pronator drift of out-stretched arms. Muscle bulk and tone are normal. Muscle strength is normal.  REFLEXES: Reflexes are 2+ and symmetric at the biceps, triceps, knees, and ankles. Plantar responses are flexor.  SENSORY: Intact to light touch, pinprick, positional sensation and vibratory sensation are intact in fingers and toes.  COORDINATION: Rapid alternating movements and fine finger movements are intact. There is no dysmetria on finger-to-nose and heel-knee-shin.    GAIT/STANCE: Posture is normal. Gait is steady with normal steps, base, arm swing, and turning. Heel and toe walking are normal. Tandem gait is normal.  Romberg is absent.   DIAGNOSTIC DATA (LABS, IMAGING, TESTING) - I reviewed patient records, labs, notes, testing and imaging myself where available.   ASSESSMENT AND PLAN  Dana Greer is a 47 y.o. female   Chronic migraine headaches Depression anxiety Bilateral upper lower extremity paresthesia   differentiation diagnoses including peripheral neuropathy,   Laboratory evaluation for possible etiology  EMG nerve conduction study   Topamax titrating to 100 mg twice a day as preventive medications  Maxalt 5 mg as needed for migraine, combine it with Zofran,   Marcial Pacas, M.D. Ph.D.  [facility] [address] [place], [place] [address] Ph: [phone] Fax: [phone]  CC: [name], Wynelle Fanny, MD

## 2018-04-13 LAB — VITAMIN B12: VITAMIN B 12: 768 pg/mL (ref 232–1245)

## 2018-04-13 LAB — FOLATE: FOLATE: 9.5 ng/mL (ref >3.0)

## 2018-04-13 LAB — C-REACTIVE PROTEIN: CRP: 2.1 mg/L (ref 0.0–4.9)

## 2018-04-13 LAB — RPR: RPR Ser Ql: NONREACTIVE

## 2018-04-13 LAB — ANA W/REFLEX IF POSITIVE: Anti Nuclear Antibody(ANA): NEGATIVE

## 2018-04-13 LAB — HIV ANTIBODY (ROUTINE TESTING W REFLEX): HIV Screen 4th Generation wRfx: NONREACTIVE

## 2018-04-16 ENCOUNTER — Telehealth: Payer: Self-pay | Admitting: *Deleted

## 2018-04-16 NOTE — Telephone Encounter (Signed)
Spoke to patient's mother, Elon Spanner (on [place]) - she is aware of the lab results.

## 2018-04-16 NOTE — Telephone Encounter (Signed)
-----   Message from Marcial Pacas, MD sent at 04/16/2018  8:23 AM EDT ----- Please call patient for normal laboratory result

## 2018-04-24 ENCOUNTER — Ambulatory Visit (INDEPENDENT_AMBULATORY_CARE_PROVIDER_SITE_OTHER): Payer: Medicare Other | Admitting: Psychology

## 2018-04-24 DIAGNOSIS — F331 Major depressive disorder, recurrent, moderate: Secondary | ICD-10-CM

## 2018-04-30 ENCOUNTER — Ambulatory Visit (INDEPENDENT_AMBULATORY_CARE_PROVIDER_SITE_OTHER): Payer: Medicare Other | Admitting: Family Medicine

## 2018-04-30 ENCOUNTER — Ambulatory Visit (INDEPENDENT_AMBULATORY_CARE_PROVIDER_SITE_OTHER)
Admission: RE | Admit: 2018-04-30 | Discharge: 2018-04-30 | Disposition: A | Discharge status: Home / Self Care | Payer: Medicare Other | Source: Ambulatory Visit | Attending: Family Medicine | Admitting: Family Medicine

## 2018-04-30 ENCOUNTER — Encounter: Payer: Self-pay | Admitting: Family Medicine

## 2018-04-30 ENCOUNTER — Encounter: Payer: Self-pay | Admitting: *Deleted

## 2018-04-30 VITALS — BP 104/70 | HR 72 | Temp 97.8°F | Ht 62.5 in | Wt 207.5 lb

## 2018-04-30 DIAGNOSIS — G8929 Other chronic pain: Secondary | ICD-10-CM

## 2018-04-30 DIAGNOSIS — M6798 Unspecified disorder of synovium and tendon, other site: Secondary | ICD-10-CM

## 2018-04-30 DIAGNOSIS — M25551 Pain in right hip: Secondary | ICD-10-CM

## 2018-04-30 DIAGNOSIS — M67951 Unspecified disorder of synovium and tendon, right thigh: Secondary | ICD-10-CM

## 2018-04-30 DIAGNOSIS — M7918 Myalgia, other site: Secondary | ICD-10-CM | POA: Diagnosis not present

## 2018-04-30 DIAGNOSIS — M7061 Trochanteric bursitis, right hip: Secondary | ICD-10-CM | POA: Diagnosis not present

## 2018-04-30 MED ORDER — PREDNISONE 20 MG PO TABS: ORAL_TABLET | ORAL | 0 refills | Status: DC

## 2018-04-30 NOTE — Progress Notes (Signed)
Dr. Frederico Hamman T. Melodie Ashworth, MD, [facility] [place], [address] Phone: [phone] Fax: [phone]  04/30/2018  Patient: Dana Greer, MRN: [id number], DOB: Oct 06, 1971, 47 y.o.  Primary Physician:  [name], Wynelle Fanny, MD   Chief Complaint  Patient presents with  . Hip Pain    Right   Subjective:   Dana Greer is a 47 y.o. very pleasant female patient who presents with the following:  R hip laterally and feels into the R groin. Has been ongoing now for 2-3 months.   She is a pleasant lady who I recall well from prior years.  She presents now with primarily right-sided hip pain, but she primarily is describing lateral hip pain in the region of the trochanteric bursa, and also caudal to this along her superior pelvis.  She occasionally does have some groin pain.  She has no known history of significant osteoarthritis, she has not any significant history of traumatic injury or fracture to this region.  No prior surgeries.  This is been ongoing for 2 to 3 months, and it is gradually been getting worse.  She actually is doing some physical therapy now, she is unclear whether or not this is helping at.  Basic over-the-counter remedies and NSAIDs have not really helped significantly.   Past Medical History, Surgical History, Social History, Family History, Problem List, Medications, and Allergies have been reviewed and updated if relevant.  Patient Active Problem List   Diagnosis Date Noted  . Paresthesia 04/12/2018  . Chronic migraine 04/12/2018  . B12 deficiency 01/10/2018  . Foot pain, bilateral 01/09/2018  . Hand paresthesia 01/09/2018  . Vitamin D deficiency 01/09/2018  . Left buttock pain 11/07/2017  . Chronic myofascial pain 10/26/2016  . Screening mammogram, encounter for 10/25/2016  . Screening for lipoid disorders 10/20/2016  . HSV-2 infection 08/17/2016  . Right knee pain 01/27/2016  . Adverse  effects of medication 11/25/2015  . Vertigo 10/08/2015  . Pain of right thumb 03/16/2015  . Carpal tunnel syndrome 03/16/2015  . Left hip pain 02/17/2015  . Shoulder pain, left 02/17/2015  . Knee pain, bilateral 01/07/2015  . Gout 12/22/2014  . Visit for routine gyn exam 08/04/2014  . Visit for screening mammogram 08/04/2014  . Pain, joint, multiple sites 05/26/2014  . Vertiginous migraine 02/03/2014  . Intractable migraine 08/31/2012  . Osteoarthritis 05/11/2012  . Female bladder prolapse 05/11/2012  . ADJ DISORDER WITH MIXED ANXIETY & DEPRESSED MOOD 06/30/2008  . HYPERHIDROSIS 01/25/2008  . BACK PAIN, LUMBAR, CHRONIC 10/16/2007  . REACTION, ACUTE STRESS W/EMOTIONAL DSTURB 08/01/2007  . HYPERGLYCEMIA 08/01/2007  . Essential hypertension 08/01/2007  . Goiter 07/11/2007  . POLYCYSTIC OVARIAN DISEASE 05/24/2007  . HIRSUTISM 05/24/2007  . Arthropathy, multiple sites 05/24/2007    Past Medical History:  Diagnosis Date  . Arthritis   . Depression   . Fibromyalgia   . Headache(784.0)   . HSV-2 infection   . Immune deficiency disorder ([place])   . Shingles   . Stroke ([place])   . Thrombocytopenia ([place]) 10/01/2012    Past Surgical History:  Procedure Laterality Date  . ABDOMINAL HYSTERECTOMY    . CESAREAN SECTION  2008   x1  . PARTIAL HYSTERECTOMY  2012   due to fibroid  . UPPER GASTROINTESTINAL ENDOSCOPY    . [place] EXTRACTION  1995    Social History   Socioeconomic History  . Marital status: Legally Separated  Spouse name: Not on file  . Number of children: 3  . Years of education: college  . Highest education level: Not on file  Occupational History  . Occupation: Unemployed  Social Needs  . Financial resource strain: Not on file  . Food insecurity:    Worry: Not on file    Inability: Not on file  . Transportation needs:    Medical: Not on file    Non-medical: Not on file  Tobacco Use  . Smoking status: Never Smoker  . Smokeless tobacco: Never Used    . Tobacco comment: Pt states she smokes.....but "not cigarettes"  Substance and Sexual Activity  . Alcohol use: Yes    Comment: occasional  . Drug use: Yes    Types: Marijuana    Comment: smokes small amount twice daily  . Sexual activity: Yes    Birth control/protection: None  Lifestyle  . Physical activity:    Days per week: Not on file    Minutes per session: Not on file  . Stress: Not on file  Relationships  . Social connections:    Talks on phone: Not on file    Gets together: Not on file    Attends religious service: Not on file    Active member of club or organization: Not on file    Attends meetings of clubs or organizations: Not on file    Relationship status: Not on file  . Intimate partner violence:    Fear of current or ex partner: Not on file    Emotionally abused: Not on file    Physically abused: Not on file    Forced sexual activity: Not on file  Other Topics Concern  . Not on file  Social History Narrative   Lives at home with her children.   6 cups caffeine per day.   Right-handed.    Family History  Problem Relation Age of Onset  . Diabetes Father   . COPD Father   . Healthy Mother   . Breast cancer Paternal Aunt   . Colon cancer Paternal Grandmother     Allergies  Allergen Reactions  . Trazodone And Nefazodone Nausea And Vomiting  . Amoxicillin Other (See Comments)    Yeast infection   . Carafate [Sucralfate] Nausea And Vomiting    Medication list reviewed and updated in full in [place].  GEN: No fevers, chills. Nontoxic. Primarily MSK c/o today. MSK: Detailed in the HPI GI: tolerating PO intake without difficulty Neuro: No numbness, parasthesias, or tingling associated. Otherwise the pertinent positives of the ROS are noted above.   Objective:   BP 104/70   Pulse 72   Temp 97.8 F (36.6 C) (Oral)   Ht 5' 2.5" (1.588 m)   Wt 207 lb 8 oz (94.1 kg)   BMI 37.35 kg/m    GEN: WDWN, NAD, Non-toxic, Alert & Oriented x  3 HEENT: Atraumatic, Normocephalic.  Ears and Nose: No external deformity. EXTR: No clubbing/cyanosis/edema NEURO: Normal gait.  PSYCH: Normally interactive. Conversant. Not depressed or anxious appearing.  Calm demeanor.   HIP EXAM: SIDE: R ROM: Abduction, Flexion, Internal and External range of motion: She is only able to abduct the leg approximately 30 degrees.  With the hip flexed to 90 degrees there is an approximate 40 to 45 degrees of motion possible in total internal and external rotation. Pain with terminal IROM and EROM: Minimal GTB: Tender SLR: NEG Knees: No effusion FABER: NT REVERSE FABER: NT, neg Piriformis: NT at  direct palpation Str: flexion: 3+ /5 abduction: 3+ /5 adduction: 3 /5 Strength testing tender  She is also tender throughout the entirety of the pelvic rim on the right side superiorly.    Radiology: Dg Hip Unilat With Pelvis 2-3 Views Right  Result Date: 05/01/2018 CLINICAL DATA:  47 year old female with right hip pain for the past 2-3 months extending into groin. No reported injury. Initial encounter. EXAM: DG HIP (WITH OR WITHOUT PELVIS) 2-3V RIGHT COMPARISON:  None. FINDINGS: No fracture or dislocation. Preservation joint space without evidence of femoral head avascular necrosis. No osseous destructive lesion. Sacroiliac joints intact bilaterally. IMPRESSION: Negative. Electronically Signed   By: Genia Del M.D.   On: 05/01/2018 06:22     Assessment and Plan:   Right hip pain - Plan: DG HIP UNILAT WITH PELVIS 2-3 VIEWS RIGHT  Chronic myofascial pain  Tendinopathy of right gluteus medius  Trochanteric bursitis of right hip  Trochanteric bursitis on the right side with diffuse tendinopathy at both distal and caudal insertions of essentially all rotators and hip stabilizers on the right side.  Chronic myofascial pain may be a confounder.  Her exam response is greater than what would normally be anticipated.  I am good to back down her rehab and  do some very directed rehab to work on the strength in her hip on the right side.  Left side is reasonably strong.  Also get a pulse her with some steroids to see if we can get her symptoms better controlled to allow her to recuperate her strength on the side.  Follow-up: 5 weeks  Patient Instructions  Hip Rehab:  Hip Flexion: Toe up to ceiling, laying on your back. Lift your whole leg, 3 sets. Work up to being able to do #30 with each set.  Hip elevations, Toe and leg turned out to side.  Lift whole leg, 3 sets. Work up to being able to do #30 with each set.  Hip Abductions: Lying on side, straight out to side. 3 sets, work up to being able to do #30 with each set.  At the beginning you may only be able to do a lot less, try to do #10.     Meds ordered this encounter  Medications  . predniSONE (DELTASONE) 20 MG tablet    Sig: 2 tabs po for 7 days, then 1 tab po for 7 days    Dispense:  21 tablet    Refill:  0   Orders Placed This Encounter  Procedures  . DG HIP UNILAT WITH PELVIS 2-3 VIEWS RIGHT    Signed,  Shelvie Salsberry T. Xian Apostol, MD   Allergies as of 04/30/2018      Reactions   Trazodone And Nefazodone Nausea And Vomiting   Amoxicillin Other (See Comments)   Yeast infection    Carafate [sucralfate] Nausea And Vomiting      Medication List        Accurate as of 04/30/18 11:59 PM. Always use your most recent med list.          ALPRAZolam 1 MG tablet Commonly known as:  XANAX Take 1 mg by mouth QID.   amitriptyline 25 MG tablet Commonly known as:  ELAVIL Take 2 tablets (50 mg total) by mouth daily.   cloNIDine 0.2 MG tablet Commonly known as:  CATAPRES Take 0.2 mg by mouth daily.   dicyclomine 10 MG capsule Commonly known as:  BENTYL Take 1 capsule (10 mg total) by mouth daily as needed for spasms.  fluticasone 50 MCG/ACT nasal spray Commonly known as:  FLONASE Place 2 sprays into both nostrils daily.   gabapentin 300 MG capsule Commonly known as:   NEURONTIN TAKE 1 OR 2 CAPSULES BY MOUTH 3 TIMES A DAY   IMPLANON 68 MG Impl implant Generic drug:  etonogestrel Inject 1 each into the skin continuous. Reported on 01/27/2016   meclizine 25 MG tablet Commonly known as:  ANTIVERT Take 25 mg by mouth 3 (three) times daily as needed for dizziness.   ondansetron 4 MG disintegrating tablet Commonly known as:  ZOFRAN ODT Take 1 tablet (4 mg total) by mouth every 8 (eight) hours as needed for nausea or vomiting.   PARoxetine 40 MG tablet Commonly known as:  PAXIL Take 1 tablet (40 mg total) by mouth daily.   predniSONE 20 MG tablet Commonly known as:  DELTASONE 2 tabs po for 7 days, then 1 tab po for 7 days   promethazine 25 MG tablet Commonly known as:  PHENERGAN TAKE ONE TABLET BY MOUTH EVERY 8 HOURS AS NEEDED FOR NAUSEA AND VOMITING   ranitidine 75 MG tablet Commonly known as:  ZANTAC Take 150 mg by mouth every morning.   rizatriptan 5 MG disintegrating tablet Commonly known as:  MAXALT-MLT Take 1 tablet (5 mg total) by mouth as needed. May repeat in 2 hours if needed   tetrahydrozoline 0.05 % ophthalmic solution Place 1 drop into both eyes daily as needed (for dry eyes).   topiramate 100 MG tablet Commonly known as:  TOPAMAX Take 1 tablet (100 mg total) by mouth 2 (two) times daily.   valACYclovir 500 MG tablet Commonly known as:  VALTREX Take 1 tablet (500 mg total) by mouth daily.   Vitamin D (Ergocalciferol) 50000 units Caps capsule Commonly known as:  DRISDOL Take 1 capsule (50,000 Units total) by mouth every 7 (seven) days. For 12 weeks

## 2018-04-30 NOTE — Patient Instructions (Signed)
Hip Rehab:  Hip Flexion: Toe up to ceiling, laying on your back. Lift your whole leg, 3 sets. Work up to being able to do #30 with each set.  Hip elevations, Toe and leg turned out to side.  Lift whole leg, 3 sets. Work up to being able to do #30 with each set.  Hip Abductions: Lying on side, straight out to side. 3 sets, work up to being able to do #30 with each set.  At the beginning you may only be able to do a lot less, try to do #10.  

## 2018-05-08 ENCOUNTER — Ambulatory Visit (INDEPENDENT_AMBULATORY_CARE_PROVIDER_SITE_OTHER): Payer: Medicare Other | Admitting: Psychology

## 2018-05-08 DIAGNOSIS — F331 Major depressive disorder, recurrent, moderate: Secondary | ICD-10-CM | POA: Diagnosis not present

## 2018-05-22 ENCOUNTER — Ambulatory Visit (INDEPENDENT_AMBULATORY_CARE_PROVIDER_SITE_OTHER): Payer: Medicare Other | Admitting: Psychology

## 2018-05-22 DIAGNOSIS — F331 Major depressive disorder, recurrent, moderate: Secondary | ICD-10-CM

## 2018-05-25 ENCOUNTER — Ambulatory Visit (INDEPENDENT_AMBULATORY_CARE_PROVIDER_SITE_OTHER): Payer: Medicare Other | Admitting: Neurology

## 2018-05-25 ENCOUNTER — Encounter (INDEPENDENT_AMBULATORY_CARE_PROVIDER_SITE_OTHER): Payer: Self-pay | Admitting: Neurology

## 2018-05-25 DIAGNOSIS — IMO0002 Reserved for concepts with insufficient information to code with codable children: Secondary | ICD-10-CM

## 2018-05-25 DIAGNOSIS — R202 Paresthesia of skin: Secondary | ICD-10-CM

## 2018-05-25 DIAGNOSIS — G43709 Chronic migraine without aura, not intractable, without status migrainosus: Secondary | ICD-10-CM

## 2018-05-25 DIAGNOSIS — Z0289 Encounter for other administrative examinations: Secondary | ICD-10-CM

## 2018-05-25 NOTE — Procedures (Signed)
Full Name: Dana Greer Gender: Female MRN #: [id number] Date of Birth: [birth year]    Visit Date: 05/25/2018 12:24 Age: 47 Years 20 Months Old Examining Physician: Marcial Pacas, MD  Referring Physician: Krista Blue, MD History: 47 year old female, with intermittent bilateral feet, and bilateral hands paresthesia  Summary of the tests:  Nerve conduction study: Left sural, superficial peroneal sensory responses were normal.  Left tibial, peroneal to EDB motor responses were normal.  Bilateral ulnar sensory responses were normal.  Left ulnar motor responses were normal. Bilateral median sensory responses were absent.  Bilateral median motor responses showed moderately to severely prolonged distal latency, with decreased the C map amplitude, left side is mild, right side is moderate, with normal conduction velocity.  Electromyography: Selective needle examination was performed at the left upper, lower extremity muscles, bilateral abductor pollicis brevis.  There is evidence of mildly enlarged motor unit potential with mildly decreased recruitment at bilateral abductor pollicis brevis.   There is no significant abnormality otherwise.  Conclusion: This is an abnormal study.  There is evidence of bilateral median neuropathy across the wrist, consistent with moderate to severe bilateral carpal tunnel syndromes, there is no evidence of axonal loss.  There is no evidence of large fiber peripheral neuropathy, no evidence of left lumbar cervical radiculopathy.      ------------------------------- Marcial Pacas, M.D.  [facility] [place], [place] [address] Tel: [phone] Fax: [phone]        [facility]    Nerve / Sites Muscle Latency Ref. Amplitude Ref. Rel Amp Segments Distance Velocity Ref. Area    ms ms mV mV %  cm m/s m/s mVms  L Median - APB     Wrist APB 7.6 ?4.4 3.5 ?4.0 100 Wrist - APB 7   17.3     Upper arm APB 12.0  2.7  77.6 Upper arm - Wrist 22  50 ?49 14.3  R Median - APB     Wrist APB 8.2 ?4.4 1.8 ?4.0 100 Wrist - APB 7   7.9     Upper arm APB 12.1  2.1  117 Upper arm - Wrist 22 56 ?49 13.9  L Ulnar - ADM     Wrist ADM 2.7 ?3.3 10.8 ?6.0 100 Wrist - ADM 7   30.6     B.Elbow ADM 6.6  9.7  89.7 B.Elbow - Wrist 20 51 ?49 27.3     A.Elbow ADM 8.5  9.4  97.7 A.Elbow - B.Elbow 10 51 ?49 27.6         A.Elbow - Wrist      R Ulnar - ADM     Wrist ADM 2.6 ?3.3 10.4 ?6.0 100 Wrist - ADM 7   25.6     B.Elbow ADM 6.6  9.4  90.5 B.Elbow - Wrist 20 50 ?49 25.0     A.Elbow ADM 8.3  9.3  98.8 A.Elbow - B.Elbow 10 58 ?49 25.2         A.Elbow - Wrist      L Peroneal - EDB     Ankle EDB 4.9 ?6.5 8.0 ?2.0 100 Ankle - EDB 9   24.1     Fib head EDB 12.0  6.7  84.2 Fib head - Ankle 33 46 ?44 22.1     Pop fossa EDB 14.0  7.2  107 Pop fossa - Fib head 10 51 ?44 23.6         Pop fossa - Ankle  L Tibial - AH     Ankle AH 5.3 ?5.8 7.3 ?4.0 100 Ankle - AH 9   24.7     Pop fossa AH 13.2  6.0  82.1 Pop fossa - Ankle 36 45 ?41 24.2                 SNC    Nerve / Sites Rec. Site Peak Lat Ref.  Amp Ref. Segments Distance    ms ms V V  cm  L Sural - Ankle (Calf)     Calf Ankle 3.8 ?4.4 16 ?6 Calf - Ankle 14  L Superficial peroneal - Ankle     Lat leg Ankle 4.2 ?4.4 7 ?6 Lat leg - Ankle 14  L Median - Orthodromic (Dig II, Mid palm)     Dig II Wrist NR ?3.4 NR ?10 Dig II - Wrist 13  R Median - Orthodromic (Dig II, Mid palm)     Dig II Wrist NR ?3.4 NR ?10 Dig II - Wrist 13  L Ulnar - Orthodromic, (Dig V, Mid palm)     Dig V Wrist 2.8 ?3.1 8 ?5 Dig V - Wrist 11  R Ulnar - Orthodromic, (Dig V, Mid palm)     Dig V Wrist 2.7 ?3.1 5 ?5 Dig V - Wrist 71                 F  Wave    Nerve F Lat Ref.   ms ms  L Tibial - AH 53.6 ?56.0  L Ulnar - ADM 27.6 ?32.0  R Ulnar - ADM 27.5 ?32.0           EMG full       EMG Summary Table    Spontaneous MUAP Recruitment  Muscle IA Fib PSW Fasc Other Amp Dur. Poly Pattern  L. First dorsal interosseous  Normal None None None _______ Normal Normal Normal Normal  L. Abductor pollicis brevis Normal None None None _______ Normal Normal Normal Normal  L. Pronator teres Normal None None None _______ Normal Normal Normal Normal  L. Biceps brachii Normal None None None _______ Normal Normal Normal Normal  L. Deltoid Normal None None None _______ Normal Normal Normal Normal  L. Triceps brachii Normal None None None _______ Normal Normal Normal Normal  R. Abductor pollicis brevis Normal None None None _______ Normal Normal Normal Normal  L. Tibialis anterior Normal None None None _______ Normal Normal Normal Normal  L. Tibialis posterior Normal None None None _______ Normal Normal Normal Normal  L. Peroneus longus Normal None None None _______ Normal Normal Normal Normal  L. Gastrocnemius (Medial head) Normal None None None _______ Normal Normal Normal Normal  L. Vastus lateralis Normal None None None _______ Normal Normal Normal Normal

## 2018-05-29 ENCOUNTER — Ambulatory Visit (INDEPENDENT_AMBULATORY_CARE_PROVIDER_SITE_OTHER): Payer: Medicare Other | Admitting: Psychology

## 2018-05-29 DIAGNOSIS — F331 Major depressive disorder, recurrent, moderate: Secondary | ICD-10-CM | POA: Diagnosis not present

## 2018-06-04 ENCOUNTER — Ambulatory Visit (INDEPENDENT_AMBULATORY_CARE_PROVIDER_SITE_OTHER): Payer: Medicare Other | Admitting: Family Medicine

## 2018-06-04 ENCOUNTER — Encounter: Payer: Self-pay | Admitting: Family Medicine

## 2018-06-04 VITALS — BP 120/80 | HR 80 | Temp 98.8°F | Ht 62.5 in | Wt 196.2 lb

## 2018-06-04 DIAGNOSIS — M7061 Trochanteric bursitis, right hip: Secondary | ICD-10-CM | POA: Diagnosis not present

## 2018-06-04 DIAGNOSIS — M25551 Pain in right hip: Secondary | ICD-10-CM | POA: Diagnosis not present

## 2018-06-04 DIAGNOSIS — G5603 Carpal tunnel syndrome, bilateral upper limbs: Secondary | ICD-10-CM

## 2018-06-04 NOTE — Patient Instructions (Signed)
REFERRALS TO SPECIALISTS, SPECIAL TESTS (MRI, CT, ULTRASOUNDS)  MARION or  Anastasiya will help you. ASK CHECK-IN FOR HELP.  Specialist appointment times vary a great deal, based on their schedule / openings. -- Some specialists have very long wait times. (Example. Dermatology)    

## 2018-06-04 NOTE — Progress Notes (Signed)
Dr. Frederico Hamman T. Margrete Delude, MD, [facility] [place], [address] Phone: [phone] Fax: [phone]  06/04/2018  Patient: Dana Greer, MRN: [id number], DOB: 11-22-1970, 47 y.o.  Primary Physician:  [name], Wynelle Fanny, MD   Chief Complaint  Patient presents with  . Follow-up    Right Hip   Subjective:   Dana Greer is a 47 y.o. very pleasant female patient who presents with the following:  Hip is a lot better, some pain, but last time I saw the patient she was having some pain in the true groin as well as some lateral trochanteric bursa pain and probably what I felt to be some gluteus medius tendinopathy.  I gave her a burst of some prednisone and had her do some rehabilitation at home, and she has done dramatically better.  Her hip pain is much better, though it is still present to a minor degree.  She also wanted me to review her nerve conduction studies with her.  I did these in their entirety and went over them almost word for word with her.  Also reviewed carpal tunne  Past Medical History, Surgical History, Social History, Family History, Problem List, Medications, and Allergies have been reviewed and updated if relevant.  Patient Active Problem List   Diagnosis Date Noted  . Paresthesia 04/12/2018  . Chronic migraine 04/12/2018  . B12 deficiency 01/10/2018  . Foot pain, bilateral 01/09/2018  . Hand paresthesia 01/09/2018  . Vitamin D deficiency 01/09/2018  . Left buttock pain 11/07/2017  . Chronic myofascial pain 10/26/2016  . Screening mammogram, encounter for 10/25/2016  . Screening for lipoid disorders 10/20/2016  . HSV-2 infection 08/17/2016  . Right knee pain 01/27/2016  . Adverse effects of medication 11/25/2015  . Vertigo 10/08/2015  . Pain of right thumb 03/16/2015  . Carpal tunnel syndrome 03/16/2015  . Left hip pain 02/17/2015  . Shoulder pain, left 02/17/2015  . Knee pain,  bilateral 01/07/2015  . Gout 12/22/2014  . Visit for routine gyn exam 08/04/2014  . Visit for screening mammogram 08/04/2014  . Pain, joint, multiple sites 05/26/2014  . Vertiginous migraine 02/03/2014  . Intractable migraine 08/31/2012  . Osteoarthritis 05/11/2012  . Female bladder prolapse 05/11/2012  . ADJ DISORDER WITH MIXED ANXIETY & DEPRESSED MOOD 06/30/2008  . HYPERHIDROSIS 01/25/2008  . BACK PAIN, LUMBAR, CHRONIC 10/16/2007  . REACTION, ACUTE STRESS W/EMOTIONAL DSTURB 08/01/2007  . HYPERGLYCEMIA 08/01/2007  . Essential hypertension 08/01/2007  . Goiter 07/11/2007  . POLYCYSTIC OVARIAN DISEASE 05/24/2007  . HIRSUTISM 05/24/2007  . Arthropathy, multiple sites 05/24/2007    Past Medical History:  Diagnosis Date  . Arthritis   . Depression   . Fibromyalgia   . Headache(784.0)   . HSV-2 infection   . Immune deficiency disorder ([place])   . Shingles   . Stroke ([place])   . Thrombocytopenia ([place]) 10/01/2012    Past Surgical History:  Procedure Laterality Date  . ABDOMINAL HYSTERECTOMY    . CESAREAN SECTION  2008   x1  . PARTIAL HYSTERECTOMY  2012   due to fibroid  . UPPER GASTROINTESTINAL ENDOSCOPY    . [place] EXTRACTION  1995    Social History   Socioeconomic History  . Marital status: Legally Separated    Spouse name: Not on file  . Number of children: 3  . Years of education: college  . Highest education level: Not on file  Occupational History  .  Occupation: Unemployed  Social Needs  . Financial resource strain: Not on file  . Food insecurity:    Worry: Not on file    Inability: Not on file  . Transportation needs:    Medical: Not on file    Non-medical: Not on file  Tobacco Use  . Smoking status: Never Smoker  . Smokeless tobacco: Never Used  . Tobacco comment: Pt states she smokes.....but "not cigarettes"  Substance and Sexual Activity  . Alcohol use: Yes    Comment: occasional  . Drug use: Yes    Types: Marijuana    Comment: smokes small  amount twice daily  . Sexual activity: Yes    Birth control/protection: None  Lifestyle  . Physical activity:    Days per week: Not on file    Minutes per session: Not on file  . Stress: Not on file  Relationships  . Social connections:    Talks on phone: Not on file    Gets together: Not on file    Attends religious service: Not on file    Active member of club or organization: Not on file    Attends meetings of clubs or organizations: Not on file    Relationship status: Not on file  . Intimate partner violence:    Fear of current or ex partner: Not on file    Emotionally abused: Not on file    Physically abused: Not on file    Forced sexual activity: Not on file  Other Topics Concern  . Not on file  Social History Narrative   Lives at home with her children.   6 cups caffeine per day.   Right-handed.    Family History  Problem Relation Age of Onset  . Diabetes Father   . COPD Father   . Healthy Mother   . Breast cancer Paternal Aunt   . Colon cancer Paternal Grandmother     Allergies  Allergen Reactions  . Trazodone And Nefazodone Nausea And Vomiting  . Amoxicillin Other (See Comments)    Yeast infection   . Carafate [Sucralfate] Nausea And Vomiting    Medication list reviewed and updated in full in [place].  GEN: No fevers, chills. Nontoxic. Primarily MSK c/o today. MSK: Detailed in the HPI GI: tolerating PO intake without difficulty Neuro: No numbness, parasthesias, or tingling associated. Otherwise the pertinent positives of the ROS are noted above.   Objective:   BP 120/80   Pulse 80   Temp 98.8 F (37.1 C) (Oral)   Ht 5' 2.5" (1.588 m)   Wt 196 lb 4 oz (89 kg)   BMI 35.32 kg/m    GEN: WDWN, NAD, Non-toxic, Alert & Oriented x 3 HEENT: Atraumatic, Normocephalic.  Ears and Nose: No external deformity. EXTR: No clubbing/cyanosis/edema NEURO: Normal gait.  PSYCH: Normally interactive. Conversant. Not depressed or anxious appearing.   Calm demeanor.   HIP EXAM: SIDE: R ROM: Abduction, Flexion, Internal and External range of motion: full Pain with terminal IROM and EROM: no GTB: NT SLR: NEG Knees: No effusion FABER: NT REVERSE FABER: NT, neg Piriformis: NT at direct palpation Str: flexion: 5/5 abduction: 4+/5 on R adduction: 5/5 Strength testing non-tender  5/5 on the L  Radiology: No results found.  Assessment and Plan:   Right hip pain  Trochanteric bursitis of right hip  Carpal tunnel syndrome, bilateral - Plan: Ambulatory referral to Hand Surgery  >25 minutes spent in face to face time with patient, >50% spent in counselling  or coordination of care: additional time spent on EMG analysis and recommendations on CTS.  She has significantly improved hip pain as well as strength which is dramatically improved on basic home rehab program. She should continue this along with core rehab and continue with her swimming.  She has very significant carpal tunnel syndrome.  She has moderate to severe EMG proven carpal tunnel syndrome bilaterally.  My recommendation is to consult hand surgery for opinion about definitive management.  We will do this.  Follow-up: No follow-ups on file.  Orders Placed This Encounter  Procedures  . Ambulatory referral to Hand Surgery    Signed,  Frederico Hamman T. Iris Hairston, MD   Allergies as of 06/04/2018      Reactions   Trazodone And Nefazodone Nausea And Vomiting   Amoxicillin Other (See Comments)   Yeast infection    Carafate [sucralfate] Nausea And Vomiting      Medication List        Accurate as of 06/04/18  1:48 PM. Always use your most recent med list.          ALPRAZolam 1 MG tablet Commonly known as:  XANAX Take 1 mg by mouth QID.   amitriptyline 25 MG tablet Commonly known as:  ELAVIL Take 2 tablets (50 mg total) by mouth daily.   cloNIDine 0.2 MG tablet Commonly known as:  CATAPRES Take 0.2 mg by mouth daily.   dicyclomine 10 MG capsule Commonly known  as:  BENTYL Take 1 capsule (10 mg total) by mouth daily as needed for spasms.   fluticasone 50 MCG/ACT nasal spray Commonly known as:  FLONASE Place 2 sprays into both nostrils daily.   gabapentin 300 MG capsule Commonly known as:  NEURONTIN TAKE 1 OR 2 CAPSULES BY MOUTH 3 TIMES A DAY   IMPLANON 68 MG Impl implant Generic drug:  etonogestrel Inject 1 each into the skin continuous. Reported on 01/27/2016   meclizine 25 MG tablet Commonly known as:  ANTIVERT Take 25 mg by mouth 3 (three) times daily as needed for dizziness.   ondansetron 4 MG disintegrating tablet Commonly known as:  ZOFRAN ODT Take 1 tablet (4 mg total) by mouth every 8 (eight) hours as needed for nausea or vomiting.   PARoxetine 40 MG tablet Commonly known as:  PAXIL Take 1 tablet (40 mg total) by mouth daily.   promethazine 25 MG tablet Commonly known as:  PHENERGAN TAKE ONE TABLET BY MOUTH EVERY 8 HOURS AS NEEDED FOR NAUSEA AND VOMITING   ranitidine 75 MG tablet Commonly known as:  ZANTAC Take 150 mg by mouth every morning.   rizatriptan 5 MG disintegrating tablet Commonly known as:  MAXALT-MLT Take 1 tablet (5 mg total) by mouth as needed. May repeat in 2 hours if needed   tetrahydrozoline 0.05 % ophthalmic solution Place 1 drop into both eyes daily as needed (for dry eyes).   topiramate 100 MG tablet Commonly known as:  TOPAMAX Take 1 tablet (100 mg total) by mouth 2 (two) times daily.   valACYclovir 500 MG tablet Commonly known as:  VALTREX Take 1 tablet (500 mg total) by mouth daily.   Vitamin D (Ergocalciferol) 50000 units Caps capsule Commonly known as:  DRISDOL Take 1 capsule (50,000 Units total) by mouth every 7 (seven) days. For 12 weeks

## 2018-06-05 ENCOUNTER — Encounter: Payer: Self-pay | Admitting: Family Medicine

## 2018-06-13 ENCOUNTER — Ambulatory Visit: Payer: Medicare Other | Admitting: Family Medicine

## 2018-06-21 ENCOUNTER — Encounter: Payer: Self-pay | Admitting: Family Medicine

## 2018-06-21 ENCOUNTER — Ambulatory Visit (INDEPENDENT_AMBULATORY_CARE_PROVIDER_SITE_OTHER): Payer: Medicare Other | Admitting: Family Medicine

## 2018-06-21 VITALS — BP 132/96 | HR 73 | Temp 98.8°F | Ht 62.5 in | Wt 201.8 lb

## 2018-06-21 DIAGNOSIS — G43119 Migraine with aura, intractable, without status migrainosus: Secondary | ICD-10-CM

## 2018-06-21 DIAGNOSIS — G5603 Carpal tunnel syndrome, bilateral upper limbs: Secondary | ICD-10-CM | POA: Diagnosis not present

## 2018-06-21 NOTE — Assessment & Plan Note (Signed)
Mod to severe bilat  Rev her NCV results Has appt with Dr Fredna Dow on Monday - may disc surgery - enc to see him as planned  She is disabled by this /worsening lately  inst not to wear splints since they seem to make symptoms worse

## 2018-06-21 NOTE — Progress Notes (Signed)
Subjective:    Patient ID: Dana Greer, female    DOB: March 31, 1971, 47 y.o.   MRN: [id number]  HPI Here for hand pain   She saw neurology and was unhappy (Dr Krista Blue)   Results of neuro tests for hand and foot paresthesia Ordered emg/ncv Titrated topamax for headache Dana Greer maxalt    Conclusion: This is an abnormal study.  There is evidence of bilateral median neuropathy across the wrist, consistent with moderate to severe bilateral carpal tunnel syndromes, there is no evidence of axonal loss.  There is no evidence of large fiber peripheral neuropathy, no evidence of left lumbar cervical radiculopathy.    Splints do not help  She is having a hard time lifting things and with self care   Has appt with Dr Fredna Dow on Monday  She really wants to go back to work when she can    Fatigue /sleepy all the time   Also has intermittent pain going up sides of both legs -to knee or hip  But today not as bad    Patient Active Problem List   Diagnosis Date Noted  . Paresthesia 04/12/2018  . Chronic migraine 04/12/2018  . B12 deficiency 01/10/2018  . Foot pain, bilateral 01/09/2018  . Hand paresthesia 01/09/2018  . Vitamin D deficiency 01/09/2018  . Left buttock pain 11/07/2017  . Chronic myofascial pain 10/26/2016  . Screening mammogram, encounter for 10/25/2016  . Screening for lipoid disorders 10/20/2016  . HSV-2 infection 08/17/2016  . Right knee pain 01/27/2016  . Adverse effects of medication 11/25/2015  . Vertigo 10/08/2015  . Pain of right thumb 03/16/2015  . Carpal tunnel syndrome 03/16/2015  . Left hip pain 02/17/2015  . Shoulder pain, left 02/17/2015  . Knee pain, bilateral 01/07/2015  . Gout 12/22/2014  . Visit for routine gyn exam 08/04/2014  . Visit for screening mammogram 08/04/2014  . Pain, joint, multiple sites 05/26/2014  . Vertiginous migraine 02/03/2014  . Intractable migraine 08/31/2012  . Osteoarthritis 05/11/2012  . Female bladder prolapse  05/11/2012  . ADJ DISORDER WITH MIXED ANXIETY & DEPRESSED MOOD 06/30/2008  . HYPERHIDROSIS 01/25/2008  . BACK PAIN, LUMBAR, CHRONIC 10/16/2007  . REACTION, ACUTE STRESS W/EMOTIONAL DSTURB 08/01/2007  . HYPERGLYCEMIA 08/01/2007  . Essential hypertension 08/01/2007  . Goiter 07/11/2007  . POLYCYSTIC OVARIAN DISEASE 05/24/2007  . HIRSUTISM 05/24/2007  . Arthropathy, multiple sites 05/24/2007   Past Medical History:  Diagnosis Date  . Arthritis   . Depression   . Fibromyalgia   . Headache(784.0)   . HSV-2 infection   . Immune deficiency disorder ([place])   . Shingles   . Stroke ([place])   . Thrombocytopenia ([place]) 10/01/2012   Past Surgical History:  Procedure Laterality Date  . ABDOMINAL HYSTERECTOMY    . CESAREAN SECTION  2008   x1  . PARTIAL HYSTERECTOMY  2012   due to fibroid  . UPPER GASTROINTESTINAL ENDOSCOPY    . [place]IS[place] TOOTH EXTRACTION  1995   Social History   Tobacco Use  . Smoking status: Never Smoker  . Smokeless tobacco: Never Used  . Tobacco comment: Pt states she smokes.....but "not cigarettes"  Substance Use Topics  . Alcohol use: Yes    Comment: occasional  . Drug use: Yes    Types: Marijuana    Comment: smokes small amount twice daily   Family History  Problem Relation Age of Onset  . Diabetes Father   . COPD Father   . Healthy Mother   . Breast  cancer Paternal Aunt   . Colon cancer Paternal Grandmother    Allergies  Allergen Reactions  . Trazodone And Nefazodone Nausea And Vomiting  . Amoxicillin Other (See Comments)    Yeast infection   . Carafate [Sucralfate] Nausea And Vomiting   Current Outpatient Medications on File Prior to Visit  Medication Sig Dispense Refill  . ALPRAZolam (XANAX) 1 MG tablet Take 1 mg by mouth QID.     Marland Kitchen amitriptyline (ELAVIL) 25 MG tablet Take 2 tablets (50 mg total) by mouth daily. 180 tablet 4  . cloNIDine (CATAPRES) 0.2 MG tablet Take 0.2 mg by mouth daily.    Marland Kitchen dicyclomine (BENTYL) 10 MG capsule Take 1 capsule (10  mg total) by mouth daily as needed for spasms.    Marland Kitchen etonogestrel (IMPLANON) 68 MG IMPL implant Inject 1 each into the skin continuous. Reported on 01/27/2016    . fluticasone (FLONASE) 50 MCG/ACT nasal spray Place 2 sprays into both nostrils daily. 16 g 6  . gabapentin (NEURONTIN) 300 MG capsule TAKE 1 OR 2 CAPSULES BY MOUTH 3 TIMES A DAY 180 capsule 5  . meclizine (ANTIVERT) 25 MG tablet Take 25 mg by mouth 3 (three) times daily as needed for dizziness.    . ondansetron (ZOFRAN ODT) 4 MG disintegrating tablet Take 1 tablet (4 mg total) by mouth every 8 (eight) hours as needed for nausea or vomiting. 20 tablet 5  . PARoxetine (PAXIL) 40 MG tablet Take 1 tablet (40 mg total) by mouth daily. 30 tablet 11  . promethazine (PHENERGAN) 25 MG tablet TAKE ONE TABLET BY MOUTH EVERY 8 HOURS AS NEEDED FOR NAUSEA AND VOMITING 30 tablet 5  . ranitidine (ZANTAC) 75 MG tablet Take 150 mg by mouth every morning.     . rizatriptan (MAXALT-MLT) 5 MG disintegrating tablet Take 1 tablet (5 mg total) by mouth as needed. May repeat in 2 hours if needed 15 tablet 12  . tetrahydrozoline 0.05 % ophthalmic solution Place 1 drop into both eyes daily as needed (for dry eyes).    . topiramate (TOPAMAX) 100 MG tablet Take 1 tablet (100 mg total) by mouth 2 (two) times daily. 60 tablet 11  . valACYclovir (VALTREX) 500 MG tablet Take 1 tablet (500 mg total) by mouth daily. 30 tablet 5  . Vitamin D, Ergocalciferol, (DRISDOL) 50000 units CAPS capsule Take 1 capsule (50,000 Units total) by mouth every 7 (seven) days. For 12 weeks 12 capsule 0   No current facility-administered medications on file prior to visit.     Review of Systems  Constitutional: Positive for fatigue. Negative for activity change, appetite change, fever and unexpected weight change.  HENT: Negative for congestion, ear pain, rhinorrhea, sinus pressure and sore throat.   Eyes: Negative for pain, redness and visual disturbance.  Respiratory: Negative for cough,  shortness of breath and wheezing.   Cardiovascular: Negative for chest pain and palpitations.  Gastrointestinal: Negative for abdominal pain, blood in stool, constipation and diarrhea.  Endocrine: Negative for polydipsia and polyuria.  Genitourinary: Negative for dysuria, frequency and urgency.  Musculoskeletal: Positive for arthralgias, myalgias and neck pain. Negative for back pain.  Skin: Negative for pallor and rash.  Allergic/Immunologic: Negative for environmental allergies.  Neurological: Positive for weakness and numbness. Negative for dizziness, tremors, syncope, speech difficulty and headaches.       Pain and tinling in wrists and hands -worse with use  Poor grip due to discomfort   Hematological: Negative for adenopathy. Does not bruise/bleed easily.  Psychiatric/Behavioral: Positive for dysphoric mood and sleep disturbance. Negative for decreased concentration. The patient is not nervous/anxious.        Objective:   Physical Exam  Constitutional: She appears well-developed and well-nourished. No distress.  obese and well appearing   HENT:  Head: Normocephalic and atraumatic.  Eyes: Pupils are equal, round, and reactive to light. Conjunctivae and EOM are normal. No scleral icterus.  Neck: Neck supple.  Cardiovascular: Normal rate, regular rhythm and normal heart sounds.  Pulmonary/Chest: Effort normal and breath sounds normal. No respiratory distress. She has no wheezes.  Musculoskeletal: She exhibits tenderness. She exhibits no edema or deformity.  Limited rom both wrists due to tenderness No swelling     Lymphadenopathy:    She has no cervical adenopathy.  Neurological: She is alert. She displays normal reflexes. No cranial nerve deficit. Coordination normal.  Skin: Skin is warm and dry. No rash noted.  Psychiatric:  Pt is tearful when discussing chronic pain symptoms and frustration with her experience  She is attentive.          Assessment & Plan:   Problem  List Items Addressed This Visit      Cardiovascular and Mediastinum   Intractable migraine    Ongoing  Continues amitriptyline Also on gabapentin and topamax Also has myofasical pain syndrome/perhaps making headaches worse  Disc lifestyle habits for HA Ref to neuro- Dr Joretta Bachelor if able to get established      Relevant Orders   Ambulatory referral to Neurology     Nervous and Auditory   Carpal tunnel syndrome - Primary    Mod to severe bilat  Rev her NCV results Has appt with Dr Fredna Dow on Monday - may disc surgery - enc to see him as planned  She is disabled by this /worsening lately  inst not to wear splints since they seem to make symptoms worse

## 2018-06-21 NOTE — Assessment & Plan Note (Signed)
Ongoing  Continues amitriptyline Also on gabapentin and topamax Also has myofasical pain syndrome/perhaps making headaches worse  Disc lifestyle habits for HA Ref to neuro- Dr Joretta Bachelor if able to get established

## 2018-06-21 NOTE — Patient Instructions (Signed)
See Dr Fredna Dow as planned on Monday   We will refer you to Dr Sima Matas for migraines

## 2018-06-25 DIAGNOSIS — M180 Bilateral primary osteoarthritis of first carpometacarpal joints: Secondary | ICD-10-CM | POA: Diagnosis not present

## 2018-06-25 DIAGNOSIS — G5603 Carpal tunnel syndrome, bilateral upper limbs: Secondary | ICD-10-CM | POA: Diagnosis not present

## 2018-06-25 DIAGNOSIS — L720 Epidermal cyst: Secondary | ICD-10-CM | POA: Diagnosis not present

## 2018-06-25 DIAGNOSIS — M542 Cervicalgia: Secondary | ICD-10-CM | POA: Insufficient documentation

## 2018-06-26 ENCOUNTER — Ambulatory Visit (INDEPENDENT_AMBULATORY_CARE_PROVIDER_SITE_OTHER): Payer: Medicare Other | Admitting: Psychology

## 2018-06-26 DIAGNOSIS — F331 Major depressive disorder, recurrent, moderate: Secondary | ICD-10-CM

## 2018-07-03 ENCOUNTER — Ambulatory Visit: Payer: Self-pay | Admitting: Psychology

## 2018-07-06 DIAGNOSIS — N939 Abnormal uterine and vaginal bleeding, unspecified: Secondary | ICD-10-CM | POA: Insufficient documentation

## 2018-07-17 ENCOUNTER — Ambulatory Visit (INDEPENDENT_AMBULATORY_CARE_PROVIDER_SITE_OTHER): Payer: Medicare Other | Admitting: Psychology

## 2018-07-17 DIAGNOSIS — F331 Major depressive disorder, recurrent, moderate: Secondary | ICD-10-CM

## 2018-07-23 ENCOUNTER — Encounter: Payer: Self-pay | Admitting: Family Medicine

## 2018-07-25 ENCOUNTER — Encounter: Payer: Self-pay | Admitting: Family Medicine

## 2018-07-25 MED ORDER — COLCHICINE 0.6 MG PO TABS: 0.6000 mg | ORAL_TABLET | Freq: Two times a day (BID) | ORAL | 2 refills | Status: DC

## 2018-07-25 MED ORDER — DICLOFENAC SODIUM 75 MG PO TBEC: 75.0000 mg | DELAYED_RELEASE_TABLET | Freq: Two times a day (BID) | ORAL | 3 refills | Status: DC

## 2018-07-31 ENCOUNTER — Ambulatory Visit (INDEPENDENT_AMBULATORY_CARE_PROVIDER_SITE_OTHER): Payer: Medicare Other | Admitting: Psychology

## 2018-07-31 DIAGNOSIS — F331 Major depressive disorder, recurrent, moderate: Secondary | ICD-10-CM

## 2018-08-10 DIAGNOSIS — M542 Cervicalgia: Secondary | ICD-10-CM | POA: Diagnosis not present

## 2018-08-10 DIAGNOSIS — G5603 Carpal tunnel syndrome, bilateral upper limbs: Secondary | ICD-10-CM | POA: Diagnosis not present

## 2018-08-14 ENCOUNTER — Ambulatory Visit (INDEPENDENT_AMBULATORY_CARE_PROVIDER_SITE_OTHER): Payer: Medicare Other | Admitting: Psychology

## 2018-08-14 DIAGNOSIS — F331 Major depressive disorder, recurrent, moderate: Secondary | ICD-10-CM

## 2018-08-16 DIAGNOSIS — G43709 Chronic migraine without aura, not intractable, without status migrainosus: Secondary | ICD-10-CM | POA: Diagnosis not present

## 2018-08-20 DIAGNOSIS — M180 Bilateral primary osteoarthritis of first carpometacarpal joints: Secondary | ICD-10-CM | POA: Diagnosis not present

## 2018-08-20 DIAGNOSIS — L720 Epidermal cyst: Secondary | ICD-10-CM | POA: Diagnosis not present

## 2018-08-20 DIAGNOSIS — G5603 Carpal tunnel syndrome, bilateral upper limbs: Secondary | ICD-10-CM | POA: Diagnosis not present

## 2018-08-21 ENCOUNTER — Other Ambulatory Visit: Payer: Self-pay | Admitting: Orthopedic Surgery

## 2018-08-28 ENCOUNTER — Ambulatory Visit: Payer: Medicare Other | Admitting: Psychology

## 2018-08-29 ENCOUNTER — Ambulatory Visit (INDEPENDENT_AMBULATORY_CARE_PROVIDER_SITE_OTHER): Payer: Medicare Other | Admitting: Psychology

## 2018-08-29 DIAGNOSIS — F331 Major depressive disorder, recurrent, moderate: Secondary | ICD-10-CM

## 2018-09-05 DIAGNOSIS — G43709 Chronic migraine without aura, not intractable, without status migrainosus: Secondary | ICD-10-CM | POA: Diagnosis not present

## 2018-09-11 ENCOUNTER — Ambulatory Visit: Payer: Medicare Other | Admitting: Psychology

## 2018-09-18 ENCOUNTER — Other Ambulatory Visit: Payer: Self-pay

## 2018-09-18 ENCOUNTER — Encounter (HOSPITAL_BASED_OUTPATIENT_CLINIC_OR_DEPARTMENT_OTHER): Payer: Self-pay | Admitting: *Deleted

## 2018-09-21 ENCOUNTER — Encounter (HOSPITAL_BASED_OUTPATIENT_CLINIC_OR_DEPARTMENT_OTHER)
Admission: RE | Admit: 2018-09-21 | Discharge: 2018-09-21 | Disposition: A | Discharge status: Home / Self Care | Payer: Medicare Other | Source: Ambulatory Visit | Attending: Orthopedic Surgery | Admitting: Orthopedic Surgery

## 2018-09-21 DIAGNOSIS — Z01810 Encounter for preprocedural cardiovascular examination: Secondary | ICD-10-CM | POA: Diagnosis not present

## 2018-09-25 ENCOUNTER — Encounter (HOSPITAL_BASED_OUTPATIENT_CLINIC_OR_DEPARTMENT_OTHER)
Admission: RE | Disposition: A | Discharge status: Home / Self Care | Payer: Self-pay | Source: Ambulatory Visit | Attending: Orthopedic Surgery

## 2018-09-25 ENCOUNTER — Ambulatory Visit (HOSPITAL_BASED_OUTPATIENT_CLINIC_OR_DEPARTMENT_OTHER): Payer: Medicare Other | Admitting: Certified Registered"

## 2018-09-25 ENCOUNTER — Ambulatory Visit (HOSPITAL_BASED_OUTPATIENT_CLINIC_OR_DEPARTMENT_OTHER)
Admission: RE | Admit: 2018-09-25 | Discharge: 2018-09-25 | Disposition: A | Discharge status: Home / Self Care | Payer: Medicare Other | Source: Ambulatory Visit | Attending: Orthopedic Surgery | Admitting: Orthopedic Surgery

## 2018-09-25 ENCOUNTER — Other Ambulatory Visit: Payer: Self-pay

## 2018-09-25 ENCOUNTER — Encounter (HOSPITAL_BASED_OUTPATIENT_CLINIC_OR_DEPARTMENT_OTHER): Payer: Self-pay | Admitting: *Deleted

## 2018-09-25 ENCOUNTER — Ambulatory Visit: Payer: Medicare Other | Admitting: Psychology

## 2018-09-25 DIAGNOSIS — G5603 Carpal tunnel syndrome, bilateral upper limbs: Secondary | ICD-10-CM | POA: Insufficient documentation

## 2018-09-25 DIAGNOSIS — F329 Major depressive disorder, single episode, unspecified: Secondary | ICD-10-CM | POA: Insufficient documentation

## 2018-09-25 DIAGNOSIS — I10 Essential (primary) hypertension: Secondary | ICD-10-CM | POA: Diagnosis not present

## 2018-09-25 DIAGNOSIS — Z8673 Personal history of transient ischemic attack (TIA), and cerebral infarction without residual deficits: Secondary | ICD-10-CM | POA: Diagnosis not present

## 2018-09-25 DIAGNOSIS — Z79899 Other long term (current) drug therapy: Secondary | ICD-10-CM | POA: Diagnosis not present

## 2018-09-25 DIAGNOSIS — Z7951 Long term (current) use of inhaled steroids: Secondary | ICD-10-CM | POA: Insufficient documentation

## 2018-09-25 DIAGNOSIS — R200 Anesthesia of skin: Secondary | ICD-10-CM | POA: Diagnosis present

## 2018-09-25 DIAGNOSIS — M180 Bilateral primary osteoarthritis of first carpometacarpal joints: Secondary | ICD-10-CM | POA: Insufficient documentation

## 2018-09-25 DIAGNOSIS — G5602 Carpal tunnel syndrome, left upper limb: Secondary | ICD-10-CM | POA: Diagnosis not present

## 2018-09-25 DIAGNOSIS — L720 Epidermal cyst: Secondary | ICD-10-CM | POA: Insufficient documentation

## 2018-09-25 HISTORY — PX: CARPAL TUNNEL RELEASE: SHX101

## 2018-09-25 HISTORY — DX: Essential (primary) hypertension: I10

## 2018-09-25 SURGERY — CARPAL TUNNEL RELEASE
Anesthesia: Monitor Anesthesia Care | Site: Hand | Laterality: Left

## 2018-09-25 MED ORDER — ONDANSETRON HCL 4 MG/2ML IJ SOLN
INTRAMUSCULAR | Status: DC | PRN
  Administered 2018-09-25: 4 mg via INTRAVENOUS

## 2018-09-25 MED ORDER — FENTANYL CITRATE (PF) 100 MCG/2ML IJ SOLN
25.0000 ug | INTRAMUSCULAR | Status: DC | PRN
  Administered 2018-09-25: 50 ug via INTRAVENOUS
  Administered 2018-09-25: 25 ug via INTRAVENOUS

## 2018-09-25 MED ORDER — CEFAZOLIN SODIUM-DEXTROSE 2-3 GM-%(50ML) IV SOLR
INTRAVENOUS | Status: DC | PRN
  Administered 2018-09-25: 2 g via INTRAVENOUS

## 2018-09-25 MED ORDER — BUPIVACAINE HCL (PF) 0.25 % IJ SOLN
INTRAMUSCULAR | Status: DC | PRN
  Administered 2018-09-25: 10 mL

## 2018-09-25 MED ORDER — MIDAZOLAM HCL 2 MG/2ML IJ SOLN
INTRAMUSCULAR | Status: AC
  Filled 2018-09-25: qty 2

## 2018-09-25 MED ORDER — DEXAMETHASONE SODIUM PHOSPHATE 4 MG/ML IJ SOLN
INTRAMUSCULAR | Status: DC | PRN
  Administered 2018-09-25: 10 mg via INTRAVENOUS

## 2018-09-25 MED ORDER — HYDROCODONE-ACETAMINOPHEN 5-325 MG PO TABS: 1.0000 | ORAL_TABLET | Freq: Four times a day (QID) | ORAL | 0 refills | Status: DC | PRN

## 2018-09-25 MED ORDER — CEFAZOLIN SODIUM-DEXTROSE 2-4 GM/100ML-% IV SOLN
INTRAVENOUS | Status: AC
  Filled 2018-09-25: qty 100

## 2018-09-25 MED ORDER — HYDRALAZINE HCL 20 MG/ML IJ SOLN
INTRAMUSCULAR | Status: AC
  Filled 2018-09-25: qty 1

## 2018-09-25 MED ORDER — HYDRALAZINE HCL 20 MG/ML IJ SOLN
5.0000 mg | Freq: Once | INTRAMUSCULAR | Status: AC
  Administered 2018-09-25: 5 mg via INTRAVENOUS

## 2018-09-25 MED ORDER — VANCOMYCIN HCL IN DEXTROSE 1-5 GM/200ML-% IV SOLN
1000.0000 mg | INTRAVENOUS | Status: AC
  Administered 2018-09-25: 1000 mg via INTRAVENOUS

## 2018-09-25 MED ORDER — CHLORHEXIDINE GLUCONATE 4 % EX LIQD: 60.0000 mL | Freq: Once | CUTANEOUS | Status: DC

## 2018-09-25 MED ORDER — ACETAMINOPHEN 325 MG PO TABS: 325.0000 mg | ORAL_TABLET | ORAL | Status: DC | PRN

## 2018-09-25 MED ORDER — LACTATED RINGERS IV SOLN
INTRAVENOUS | Status: DC
  Administered 2018-09-25: 10:00:00 via INTRAVENOUS

## 2018-09-25 MED ORDER — KETOROLAC TROMETHAMINE 30 MG/ML IJ SOLN: 30.0000 mg | Freq: Once | INTRAMUSCULAR | Status: DC | PRN

## 2018-09-25 MED ORDER — ACETAMINOPHEN 160 MG/5ML PO SOLN: 325.0000 mg | ORAL | Status: DC | PRN

## 2018-09-25 MED ORDER — SCOPOLAMINE 1 MG/3DAYS TD PT72: 1.0000 | MEDICATED_PATCH | Freq: Once | TRANSDERMAL | Status: DC | PRN

## 2018-09-25 MED ORDER — PROPOFOL 10 MG/ML IV BOLUS
INTRAVENOUS | Status: DC | PRN
  Administered 2018-09-25: 200 mg via INTRAVENOUS

## 2018-09-25 MED ORDER — OXYCODONE HCL 5 MG/5ML PO SOLN: 5.0000 mg | Freq: Once | ORAL | Status: DC | PRN

## 2018-09-25 MED ORDER — MEPERIDINE HCL 25 MG/ML IJ SOLN: 6.2500 mg | INTRAMUSCULAR | Status: DC | PRN

## 2018-09-25 MED ORDER — FENTANYL CITRATE (PF) 100 MCG/2ML IJ SOLN
50.0000 ug | INTRAMUSCULAR | Status: AC | PRN
  Administered 2018-09-25: 100 ug via INTRAVENOUS
  Administered 2018-09-25 (×2): 50 ug via INTRAVENOUS

## 2018-09-25 MED ORDER — OXYCODONE HCL 5 MG PO TABS: 5.0000 mg | ORAL_TABLET | Freq: Once | ORAL | Status: DC | PRN

## 2018-09-25 MED ORDER — HYDRALAZINE HCL 20 MG/ML IJ SOLN: 5.0000 mg | Freq: Once | INTRAMUSCULAR | Status: DC

## 2018-09-25 MED ORDER — FENTANYL CITRATE (PF) 100 MCG/2ML IJ SOLN
INTRAMUSCULAR | Status: AC
  Filled 2018-09-25: qty 2

## 2018-09-25 MED ORDER — ONDANSETRON HCL 4 MG/2ML IJ SOLN: 4.0000 mg | Freq: Once | INTRAMUSCULAR | Status: DC | PRN

## 2018-09-25 MED ORDER — MIDAZOLAM HCL 2 MG/2ML IJ SOLN
1.0000 mg | INTRAMUSCULAR | Status: DC | PRN
  Administered 2018-09-25: 2 mg via INTRAVENOUS

## 2018-09-25 SURGICAL SUPPLY — 37 items
BLADE SURG 15 STRL LF DISP TIS (BLADE) ×1 IMPLANT
BLADE SURG 15 STRL SS (BLADE) ×3
BNDG CMPR 9X4 STRL LF SNTH (GAUZE/BANDAGES/DRESSINGS) ×1
BNDG COHESIVE 3X5 TAN STRL LF (GAUZE/BANDAGES/DRESSINGS) ×3 IMPLANT
BNDG ESMARK 4X9 LF (GAUZE/BANDAGES/DRESSINGS) ×3 IMPLANT
BNDG GAUZE ELAST 4 BULKY (GAUZE/BANDAGES/DRESSINGS) ×3 IMPLANT
CHLORAPREP W/TINT 26ML (MISCELLANEOUS) ×3 IMPLANT
CORD BIPOLAR FORCEPS 12FT (ELECTRODE) ×3 IMPLANT
COVER BACK TABLE 60X90IN (DRAPES) ×3 IMPLANT
COVER MAYO STAND STRL (DRAPES) ×3 IMPLANT
COVER SURGICAL LIGHT HANDLE (MISCELLANEOUS) ×2 IMPLANT
COVER WAND RF STERILE (DRAPES) IMPLANT
CUFF TOURNIQUET SINGLE 18IN (TOURNIQUET CUFF) ×3 IMPLANT
DRAPE EXTREMITY T 121X128X90 (DRAPE) ×3 IMPLANT
DRAPE SURG 17X23 STRL (DRAPES) ×3 IMPLANT
DRSG PAD ABDOMINAL 8X10 ST (GAUZE/BANDAGES/DRESSINGS) ×3 IMPLANT
GAUZE SPONGE 4X4 12PLY STRL (GAUZE/BANDAGES/DRESSINGS) ×3 IMPLANT
GAUZE XEROFORM 1X8 LF (GAUZE/BANDAGES/DRESSINGS) ×3 IMPLANT
GLOVE BIOGEL PI IND STRL 8 (GLOVE) IMPLANT
GLOVE BIOGEL PI IND STRL 8.5 (GLOVE) ×1 IMPLANT
GLOVE BIOGEL PI INDICATOR 8 (GLOVE) ×2
GLOVE BIOGEL PI INDICATOR 8.5 (GLOVE) ×2
GLOVE SURG ORTHO 8.0 STRL STRW (GLOVE) ×3 IMPLANT
GLOVE SURG SS PI 8.0 STRL IVOR (GLOVE) ×2 IMPLANT
GOWN STRL REUS W/ TWL LRG LVL3 (GOWN DISPOSABLE) ×1 IMPLANT
GOWN STRL REUS W/TWL LRG LVL3 (GOWN DISPOSABLE) ×3
GOWN STRL REUS W/TWL XL LVL3 (GOWN DISPOSABLE) ×3 IMPLANT
NEEDLE PRECISIONGLIDE 27X1.5 (NEEDLE) ×3 IMPLANT
NS IRRIG 1000ML POUR BTL (IV SOLUTION) ×3 IMPLANT
PACK BASIN DAY SURGERY FS (CUSTOM PROCEDURE TRAY) ×3 IMPLANT
STOCKINETTE 4X48 STRL (DRAPES) ×3 IMPLANT
SUT ETHILON 4 0 PS 2 18 (SUTURE) ×3 IMPLANT
SUT VICRYL 4-0 PS2 18IN ABS (SUTURE) IMPLANT
SYR BULB 3OZ (MISCELLANEOUS) ×3 IMPLANT
SYR CONTROL 10ML LL (SYRINGE) ×3 IMPLANT
TOWEL GREEN STERILE FF (TOWEL DISPOSABLE) ×3 IMPLANT
UNDERPAD 30X30 (UNDERPADS AND DIAPERS) ×3 IMPLANT

## 2018-09-25 NOTE — Progress Notes (Signed)
Dr. Jillyn Hidden made aware of elevated BP's.  Order rec'd for pt to be D/C'd.  Final BP is 151/87

## 2018-09-25 NOTE — Anesthesia Postprocedure Evaluation (Signed)
Anesthesia Post Note  Patient: Dana Greer  Procedure(s) Performed: LEFT CARPAL TUNNEL RELEASE (Left Hand)     Patient location during evaluation: PACU Anesthesia Type: Regional Level of consciousness: awake Pain management: pain level controlled Vital Signs Assessment: post-procedure vital signs reviewed and stable Respiratory status: spontaneous breathing Cardiovascular status: stable Postop Assessment: no apparent nausea or vomiting Anesthetic complications: no    Last Vitals:  Vitals:   09/25/18 1200 09/25/18 1230  BP: (!) 164/107 (!) 151/87  Pulse: 60 86  Resp: 10 18  Temp:  37.1 C  SpO2: 100% 100%    Last Pain:  Vitals:   09/25/18 1230  TempSrc:   PainSc: 3    Pain Goal: Patients Stated Pain Goal: 3 (09/25/18 1200)               Huston Foley

## 2018-09-25 NOTE — Anesthesia Preprocedure Evaluation (Addendum)
Anesthesia Evaluation  Patient identified by MRN, date of birth, ID band Patient awake    Reviewed: Allergy & Precautions, NPO status , Patient's Chart, lab work & pertinent test results  Airway Mallampati: II       Dental no notable dental hx. (+) Teeth Intact   Pulmonary neg pulmonary ROS,    Pulmonary exam normal breath sounds clear to auscultation       Cardiovascular hypertension, Pt. on medications Normal cardiovascular exam Rhythm:Regular Rate:Normal     Neuro/Psych PSYCHIATRIC DISORDERS Depression    GI/Hepatic negative GI ROS, Neg liver ROS,   Endo/Other  negative endocrine ROS  Renal/GU negative Renal ROS  negative genitourinary   Musculoskeletal   Abdominal (+) + obese,   Peds  Hematology negative hematology ROS (+)   Anesthesia Other Findings   Reproductive/Obstetrics                             Anesthesia Physical Anesthesia Plan  ASA: II  Anesthesia Plan: General   Post-op Pain Management:    Induction:   PONV Risk Score and Plan: 3 and Ondansetron and Dexamethasone  Airway Management Planned: LMA  Additional Equipment:   Intra-op Plan:   Post-operative Plan:   Informed Consent: I have reviewed the patients History and Physical, chart, labs and discussed the procedure including the risks, benefits and alternatives for the proposed anesthesia with the patient or authorized representative who has indicated his/her understanding and acceptance.   Dental advisory given  Plan Discussed with: CRNA and Surgeon  Anesthesia Plan Comments:        Anesthesia Quick Evaluation

## 2018-09-25 NOTE — Brief Op Note (Signed)
09/25/2018  11:05 AM  PATIENT:  Dana Greer  47 y.o. female  PRE-OPERATIVE DIAGNOSIS:  LEFT CARPAL TUNNEL SYNDROME  POST-OPERATIVE DIAGNOSIS:  LEFT CARPAL TUNNEL SYNDROME  PROCEDURE:  Procedure(s): LEFT CARPAL TUNNEL RELEASE (Left)  SURGEON:  Surgeon(s) and Role:    * Daryll Brod, MD - Primary  PHYSICIAN ASSISTANT:   ASSISTANTS: none   ANESTHESIA:   local and general  EBL: 3ml  BLOOD ADMINISTERED:none  DRAINS: none   LOCAL MEDICATIONS USED:  BUPIVICAINE   SPECIMEN:  No Specimen  DISPOSITION OF SPECIMEN:  N/A  COUNTS:  YES  TOURNIQUET:   Total Tourniquet Time Documented: Upper Arm (Left) - 15 minutes Total: Upper Arm (Left) - 15 minutes   DICTATION: .Viviann Spare Dictation  PLAN OF CARE: Discharge to home after PACU  PATIENT DISPOSITION:  PACU - hemodynamically stable.

## 2018-09-25 NOTE — Op Note (Signed)
NAME: Dana Greer MEDICAL RECORD NO: [id number] DATE OF BIRTH: [birth year] FACILITY: [facility] LOCATION:  [facility] PHYSICIAN: Wynonia Sours, MD   OPERATIVE REPORT   DATE OF PROCEDURE: 09/25/18    PREOPERATIVE DIAGNOSIS:   Tunnel syndrome left hand   POSTOPERATIVE DIAGNOSIS:   Same   PROCEDURE:   Decompression median nerve left hand   SURGEON: Daryll Brod, M.D.   ASSISTANT: none   ANESTHESIA:  General and Local   INTRAVENOUS FLUIDS:  Per anesthesia flow sheet.   ESTIMATED BLOOD LOSS:  Minimal.   COMPLICATIONS:  None.   SPECIMENS:  none   TOURNIQUET TIME:    Total Tourniquet Time Documented: Upper Arm (Left) - 15 minutes Total: Upper Arm (Left) - 15 minutes    DISPOSITION:  Stable to PACU.   INDICATIONS: Patient is a 47 year old female with a history of numbness and tingling bilateral hands nerve conductions are positive this is not responded to conservative treatment.  She is like to undergo surgical decompression of the median nerve left hand.  Pre-peri-and postoperative course been discussed along with risks and complications.  She is aware that there is no guarantee to the surgery the possibility of infection recurrence injury to arteries nerves tendons incomplete relief of symptoms and dystrophy.  In the preoperative area the patient is seen the extremity marked by both patient and surgeon antibiotic given  OPERATIVE COURSE: Patient is brought to the operating room where general anesthetic was carried out without difficulty.  She was prepped using ChloraPrep in the supine position with a left arm free.  A three-minute dry time was allowed and a timeout taken to confirm patient procedure.  The limb was exsanguinated with an Esmarch bandage turn placed on the arm was inflated to 250 mmHg.  A longitudinal incision was made left palm carried down through subcutaneous tissue.  Bleeders were electrocauterized with bipolar.  The palmar fascia was split.   The superficial palmar arch was identified.  The flexor tendon to the ring little finger was identified.  To the ulnar side of the median nerve the carpal retinaculum was then incised with sharp dissection.  A right angled saw retractor placed between skin and forearm fascia.  Deep structures were dissected free with blunt dissection.  Using blunt scissors scissors the proximal aspect of the flexor retinaculum distal forearm fascia was then released for approximately 2 to 3 cm proximal to the wrist crease under direct vision.  The canal was explored.  The area compression to the nerve was apparent.  The motor branch entered the muscle distally.  The wound was copiously irrigated with saline.  The skin was closed interrupted 4 nylon sutures.  Local infiltration quarter percent bupivacaine without epinephrine was given.  A 10 cc was used.  Sterile compressive dressing was applied with the fingers free.  Inflation of the tourniquet all fingers immediately pink.  She was taken to the recovery room for observation in satisfactory condition.  She will be discharged home to return [place] in 1 week on Norco.  This will be used for breakthrough after Tylenol ibuprofen combined do not help.   Daryll Brod, MD Electronically signed, 09/25/18

## 2018-09-25 NOTE — Anesthesia Procedure Notes (Signed)
Procedure Name: LMA Insertion Date/Time: 09/25/2018 10:38 AM Performed by: Signe Colt, CRNA Pre-anesthesia Checklist: Patient identified, Emergency Drugs available, Suction available and Patient being monitored Patient Re-evaluated:Patient Re-evaluated prior to induction Oxygen Delivery Method: Circle system utilized Preoxygenation: Pre-oxygenation with 100% oxygen Induction Type: IV induction Ventilation: Mask ventilation without difficulty LMA: LMA inserted LMA Size: 4.0 Number of attempts: 1 Airway Equipment and Method: Bite block Placement Confirmation: positive ETCO2 Tube secured with: Tape Dental Injury: Teeth and Oropharynx as per pre-operative assessment
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
## 2018-09-25 NOTE — Transfer of Care (Signed)
Immediate Anesthesia Transfer of Care Note  Patient: Dana Greer  Procedure(s) Performed: LEFT CARPAL TUNNEL RELEASE (Left Hand)  Patient Location: PACU  Anesthesia Type:General  Level of Consciousness: drowsy and patient cooperative  Airway & Oxygen Therapy: Patient Spontanous Breathing and Patient connected to face mask oxygen  Post-op Assessment: Report given to RN and Post -op Vital signs reviewed and stable  Post vital signs: Reviewed and stable  Last Vitals:  Vitals Value Taken Time  BP    Temp    Pulse 85 09/25/2018 11:10 AM  Resp 24 09/25/2018 11:10 AM  SpO2 97 % 09/25/2018 11:10 AM  Vitals shown include unvalidated device data.  Last Pain:  Vitals:   09/25/18 0915  TempSrc: Oral  PainSc: 9       Patients Stated Pain Goal: 5 (31/51/76 1607)  Complications: No apparent anesthesia complications

## 2018-09-25 NOTE — Discharge Instructions (Addendum)

## 2018-09-26 ENCOUNTER — Encounter (HOSPITAL_BASED_OUTPATIENT_CLINIC_OR_DEPARTMENT_OTHER): Payer: Self-pay | Admitting: Orthopedic Surgery

## 2018-10-09 ENCOUNTER — Ambulatory Visit (INDEPENDENT_AMBULATORY_CARE_PROVIDER_SITE_OTHER): Payer: Medicare Other | Admitting: Psychology

## 2018-10-09 DIAGNOSIS — F331 Major depressive disorder, recurrent, moderate: Secondary | ICD-10-CM | POA: Diagnosis not present

## 2018-10-25 ENCOUNTER — Telehealth: Payer: Self-pay

## 2018-10-25 MED ORDER — FLUCONAZOLE 150 MG PO TABS: 150.0000 mg | ORAL_TABLET | Freq: Once | ORAL | 0 refills | Status: AC

## 2018-10-25 NOTE — Telephone Encounter (Signed)
Pt notified Rx sent to pharmacy and advise of Dr. [name]'s instructions  

## 2018-10-25 NOTE — Telephone Encounter (Signed)
Pt had a surgical procedure the end of November. The surgeon told her he had given her something during the surgery that could cause a yeast infection. If it did, she needed to contact her PCP. 1-2 weeks with white discharge. Has not tried anything OTC. Pt wants something called in to [facility]

## 2018-10-25 NOTE — Telephone Encounter (Signed)
Diflucan sent  Do not take colchicine same day she takes it -there can be an interaction  F/u if no imp in yeast symptoms

## 2018-11-06 ENCOUNTER — Ambulatory Visit (INDEPENDENT_AMBULATORY_CARE_PROVIDER_SITE_OTHER): Payer: Medicare Other | Admitting: Psychology

## 2018-11-06 DIAGNOSIS — F331 Major depressive disorder, recurrent, moderate: Secondary | ICD-10-CM

## 2018-11-20 ENCOUNTER — Ambulatory Visit (INDEPENDENT_AMBULATORY_CARE_PROVIDER_SITE_OTHER): Payer: Medicare Other | Admitting: Psychology

## 2018-11-20 DIAGNOSIS — F331 Major depressive disorder, recurrent, moderate: Secondary | ICD-10-CM | POA: Diagnosis not present

## 2018-11-23 ENCOUNTER — Other Ambulatory Visit: Payer: Self-pay

## 2018-11-23 ENCOUNTER — Emergency Department
Admission: EM | Admit: 2018-11-23 | Discharge: 2018-11-23 | Disposition: A | Discharge status: Home / Self Care | Payer: Medicare Other | Attending: Emergency Medicine | Admitting: Emergency Medicine

## 2018-11-23 ENCOUNTER — Encounter: Payer: Self-pay | Admitting: Emergency Medicine

## 2018-11-23 DIAGNOSIS — I10 Essential (primary) hypertension: Secondary | ICD-10-CM | POA: Diagnosis not present

## 2018-11-23 DIAGNOSIS — Z8673 Personal history of transient ischemic attack (TIA), and cerebral infarction without residual deficits: Secondary | ICD-10-CM | POA: Diagnosis not present

## 2018-11-23 DIAGNOSIS — R197 Diarrhea, unspecified: Secondary | ICD-10-CM | POA: Diagnosis not present

## 2018-11-23 DIAGNOSIS — F329 Major depressive disorder, single episode, unspecified: Secondary | ICD-10-CM | POA: Insufficient documentation

## 2018-11-23 DIAGNOSIS — R112 Nausea with vomiting, unspecified: Secondary | ICD-10-CM | POA: Diagnosis not present

## 2018-11-23 DIAGNOSIS — E876 Hypokalemia: Secondary | ICD-10-CM | POA: Diagnosis not present

## 2018-11-23 LAB — URINALYSIS, COMPLETE (UACMP) WITH MICROSCOPIC
BILIRUBIN URINE: NEGATIVE
Bacteria, UA: NONE SEEN
Glucose, UA: 50 mg/dL — AB
Ketones, ur: 20 mg/dL — AB
LEUKOCYTES UA: NEGATIVE
NITRITE: NEGATIVE
PH: 8 (ref 5.0–8.0)
Protein, ur: 100 mg/dL — AB
RBC / HPF: >50 RBC/hpf — ABNORMAL HIGH (ref 0–5)
SPECIFIC GRAVITY, URINE: 1.013 (ref 1.005–1.030)

## 2018-11-23 LAB — COMPREHENSIVE METABOLIC PANEL
ALK PHOS: 57 U/L (ref 38–126)
ALT: 18 U/L (ref 0–44)
AST: 26 U/L (ref 15–41)
Albumin: 4.8 g/dL (ref 3.5–5.0)
Anion gap: 10 (ref 5–15)
BUN: 8 mg/dL (ref 6–20)
CALCIUM: 9.3 mg/dL (ref 8.9–10.3)
CO2: 23 mmol/L (ref 22–32)
CREATININE: 0.88 mg/dL (ref 0.44–1.00)
Chloride: 103 mmol/L (ref 98–111)
Glucose, Bld: 148 mg/dL — ABNORMAL HIGH (ref 70–99)
Potassium: 3 mmol/L — ABNORMAL LOW (ref 3.5–5.1)
SODIUM: 136 mmol/L (ref 135–145)
TOTAL PROTEIN: 8.7 g/dL — AB (ref 6.5–8.1)
Total Bilirubin: 0.7 mg/dL (ref 0.3–1.2)

## 2018-11-23 LAB — INFLUENZA PANEL BY PCR (TYPE A & B)
INFLAPCR: NEGATIVE
INFLBPCR: NEGATIVE

## 2018-11-23 LAB — CBC WITH DIFFERENTIAL/PLATELET
Abs Immature Granulocytes: 0.05 10*3/uL (ref 0.00–0.07)
BASOS PCT: 0 %
Basophils Absolute: 0 10*3/uL (ref 0.0–0.1)
EOS PCT: 0 %
Eosinophils Absolute: 0 10*3/uL (ref 0.0–0.5)
HCT: 43.8 % (ref 36.0–46.0)
HEMOGLOBIN: 14.7 g/dL (ref 12.0–15.0)
Immature Granulocytes: 0 %
LYMPHS ABS: 1 10*3/uL (ref 0.7–4.0)
LYMPHS PCT: 9 %
MCH: 32.1 pg (ref 26.0–34.0)
MCHC: 33.6 g/dL (ref 30.0–36.0)
MCV: 95.6 fL (ref 80.0–100.0)
MONO ABS: 0.3 10*3/uL (ref 0.1–1.0)
MONOS PCT: 2 %
NRBC: 0 % (ref 0.0–0.2)
Neutro Abs: 9.9 10*3/uL — ABNORMAL HIGH (ref 1.7–7.7)
Neutrophils Relative %: 89 %
Platelets: 166 10*3/uL (ref 150–400)
RBC: 4.58 MIL/uL (ref 3.87–5.11)
RDW: 13.2 % (ref 11.5–15.5)
WBC: 11.2 10*3/uL — ABNORMAL HIGH (ref 4.0–10.5)

## 2018-11-23 LAB — LIPASE, BLOOD: LIPASE: 20 U/L (ref 11–51)

## 2018-11-23 MED ORDER — ONDANSETRON HCL 4 MG/2ML IJ SOLN
4.0000 mg | Freq: Once | INTRAMUSCULAR | Status: AC
  Administered 2018-11-23: 4 mg via INTRAVENOUS
  Filled 2018-11-23: qty 2

## 2018-11-23 MED ORDER — CLONIDINE HCL 0.1 MG PO TABS: 0.2000 mg | ORAL_TABLET | Freq: Once | ORAL | Status: DC

## 2018-11-23 MED ORDER — ONDANSETRON 4 MG PO TBDP: 4.0000 mg | ORAL_TABLET | Freq: Three times a day (TID) | ORAL | 0 refills | Status: DC | PRN

## 2018-11-23 MED ORDER — METOPROLOL TARTRATE 5 MG/5ML IV SOLN
10.0000 mg | Freq: Once | INTRAVENOUS | Status: AC
  Administered 2018-11-23: 10 mg via INTRAVENOUS
  Filled 2018-11-23: qty 10

## 2018-11-23 MED ORDER — HYDRALAZINE HCL 20 MG/ML IJ SOLN
10.0000 mg | Freq: Once | INTRAMUSCULAR | Status: AC
  Administered 2018-11-23: 10 mg via INTRAVENOUS
  Filled 2018-11-23: qty 1

## 2018-11-23 MED ORDER — POTASSIUM CHLORIDE CRYS ER 20 MEQ PO TBCR
40.0000 meq | EXTENDED_RELEASE_TABLET | Freq: Once | ORAL | Status: AC
  Administered 2018-11-23: 40 meq via ORAL
  Filled 2018-11-23: qty 2

## 2018-11-23 MED ORDER — SODIUM CHLORIDE 0.9 % IV BOLUS
1000.0000 mL | Freq: Once | INTRAVENOUS | Status: AC
  Administered 2018-11-23: 1000 mL via INTRAVENOUS

## 2018-11-23 MED ORDER — LOPERAMIDE HCL 2 MG PO TABS: 2.0000 mg | ORAL_TABLET | Freq: Four times a day (QID) | ORAL | 0 refills | Status: DC | PRN

## 2018-11-23 NOTE — Discharge Instructions (Addendum)
Please take a clear liquid diet for the next 24 to 48 hours, then advance to a bland diet as tolerated.  You may take Zofran for nausea and vomiting, or continue to use the Phenergan that you have at home.  You may take loperamide for diarrhea.  Please drink plenty of water to stay well-hydrated.  Please continue to take your blood pressure medications as prescribed.  Record your blood pressure once daily during a calm part of your day and bring the record with you to your primary care physician's office to ensure that you are being appropriately treated for your blood pressure.  Return to the emergency department if you develop severe pain, lightheadedness or fainting, numbness tingling or weakness, chest pain, shortness of breath, inability keep down fluids, abdominal pain, fever, or any other symptoms concerning to you.

## 2018-11-23 NOTE — ED Triage Notes (Signed)
Pt to triage via w/c, appears uncomfortable; c/o N/V since yesterday with generalized body cramping

## 2018-11-23 NOTE — ED Provider Notes (Signed)
[facility] Emergency Department Provider Note  ____________________________________________  Time seen: Approximately 7:08 AM  I have reviewed the triage vital signs and the nursing notes.   HISTORY  Chief Complaint Emesis    HPI Dana Greer is a 48 y.o. female with a history of HTN, presenting for nausea vomiting and diarrhea.  The patient reports that since yesterday, she has had intractable nausea and vomiting, as well as several episodes of nonbloody loose stool.  She has not had any abdominal pain, fevers or chills, urinary symptoms.  She tried promethazine without improvement of her symptoms.  She was unable to keep down any of her morning medications including her blood pressure medication; upon arrival her blood pressure was 209/116.  No known sick contacts; no cough or cold symptoms.  Past Medical History:  Diagnosis Date  . Arthritis   . Depression   . Fibromyalgia   . Headache(784.0)   . HSV-2 infection   . Hypertension   . Immune deficiency disorder ([place])   . Shingles   . Stroke ([place])   . Thrombocytopenia ([place]) 10/01/2012    Patient Active Problem List   Diagnosis Date Noted  . Chronic migraine 04/12/2018  . B12 deficiency 01/10/2018  . Foot pain, bilateral 01/09/2018  . Hand paresthesia 01/09/2018  . Vitamin D deficiency 01/09/2018  . Chronic myofascial pain 10/26/2016  . HSV-2 infection 08/17/2016  . Adverse effects of medication 11/25/2015  . Vertigo 10/08/2015  . Carpal tunnel syndrome 03/16/2015  . Gout 12/22/2014  . Vertiginous migraine 02/03/2014  . Intractable migraine 08/31/2012  . Osteoarthritis 05/11/2012  . Female bladder prolapse 05/11/2012  . ADJ DISORDER WITH MIXED ANXIETY & DEPRESSED MOOD 06/30/2008  . HYPERHIDROSIS 01/25/2008  . BACK PAIN, LUMBAR, CHRONIC 10/16/2007  . REACTION, ACUTE STRESS W/EMOTIONAL DSTURB 08/01/2007  . HYPERGLYCEMIA 08/01/2007  . Essential hypertension 08/01/2007  . Goiter  07/11/2007  . POLYCYSTIC OVARIAN DISEASE 05/24/2007  . HIRSUTISM 05/24/2007  . Arthropathy, multiple sites 05/24/2007    Past Surgical History:  Procedure Laterality Date  . ABDOMINAL HYSTERECTOMY    . CARPAL TUNNEL RELEASE Left 09/25/2018   Procedure: LEFT CARPAL TUNNEL RELEASE;  Surgeon: Daryll Brod, MD;  Location: [facility];  Service: Orthopedics;  Laterality: Left;  . CESAREAN SECTION  2008   x1  . PARTIAL HYSTERECTOMY  2012   due to fibroid  . UPPER GASTROINTESTINAL ENDOSCOPY    . WISDOM TOOTH EXTRACTION  1995    Current Outpatient Rx  . Order #: [id number] Class: Historical Med  . Order #: [id number] Class: Normal  . Order #: [id number] Class: Historical Med  . Order #: [id number] Class: Historical Med  . Order #: [id number] Class: Historical Med  . Order #: [id number] Class: Historical Med  . Order #: [id number] Class: Normal  . Order #: [id number] Class: Normal  . Order #: [id number] Class: Historical Med  . Order #: [id number] Class: Historical Med  . Order #: [id number] Class: Historical Med  . Order #: [id number] Class: Normal  . Order #: [id number] Class: Normal  . Order #: [id number] Class: Historical Med  . Order #: [id number] Class: Historical Med  . Order #: [id number] Class: Normal  . Order #: [id number] Class: Normal  . Order #: [id number] Class: Normal  . Order #: [id number] Class: Print  . Order #: [id number] Class: Print  . Order #: [id number] Class: Print  . Order #: [id number] Class: Normal    Allergies Trazodone and nefazodone; Amoxicillin; and Carafate [sucralfate]  Family History  Problem Relation Age of Onset  . Diabetes Father   . COPD Father   .  Healthy Mother   . Breast cancer Paternal Aunt   . Colon cancer Paternal Grandmother     Social History Social History   Tobacco Use  . Smoking status: Never Smoker  . Smokeless tobacco: Never Used  . Tobacco comment: Pt states she smokes.....but "not cigarettes"  Substance Use Topics  . Alcohol use: Yes     Comment: occasional  . Drug use: Yes    Types: Marijuana    Comment: smokes small amount twice daily    Review of Systems Constitutional: No fever/chills.  No lightheadedness or syncope.  Positive general malaise. Eyes: No visual changes. ENT: No sore throat. No congestion or rhinorrhea. Cardiovascular: Denies chest pain. Denies palpitations. Respiratory: Denies shortness of breath.  No cough. Gastrointestinal: No abdominal pain.  Positive nausea, positive vomiting.  Positive diarrhea.  No constipation. Genitourinary: Negative for dysuria.  No urinary frequency. Musculoskeletal: Negative for back pain. Skin: Negative for rash. Neurological: Negative for headaches. No focal numbness, tingling or weakness.     ____________________________________________   PHYSICAL EXAM:  VITAL SIGNS: ED Triage Vitals  Enc Vitals Group     BP 11/23/18 0624 (!) 209/116     Pulse Rate 11/23/18 0624 73     Resp 11/23/18 0624 17     Temp 11/23/18 0624 97.8 F (36.6 C)     Temp src --      SpO2 11/23/18 0624 100 %     Weight 11/23/18 0618 197 lb (89.4 kg)     Height 11/23/18 0618 5\' 2"  (1.575 m)     Head Circumference --      Peak Flow --      Pain Score 11/23/18 0618 9     Pain Loc --      Pain Edu? --      Excl. in [place]? --     Constitutional: Alert and oriented. Answers questions appropriately.  Uncomfortable appearing, burping during exam. Eyes: Conjunctivae are normal.  EOMI. No scleral icterus.  No eye discharge. Head: Atraumatic. Nose: No congestion/rhinnorhea. Mouth/Throat: Mucous membranes are moist.  Neck: No stridor.  Supple.  No JVD.  No meningismus. Cardiovascular: Normal rate, regular rhythm. No murmurs, rubs or gallops.  Respiratory: Normal respiratory effort.  No accessory muscle use or retractions. Lungs CTAB.  No wheezes, rales or ronchi. Gastrointestinal: Soft, nontender and nondistended.  No guarding or rebound.  No peritoneal signs. Musculoskeletal: No LE edema.   Neurologic:  A&Ox3.  Speech is clear.  Face and smile are symmetric.  EOMI.  Moves all extremities well. Skin:  Skin is warm, dry and intact. No rash noted. Psychiatric: Mood and affect are normal. Speech and behavior are normal.  Normal judgement.  ____________________________________________   LABS (all labs ordered are listed, but only abnormal results are displayed)  Labs Reviewed  URINALYSIS, COMPLETE (UACMP) WITH MICROSCOPIC - Abnormal; Notable for the following components:      Result Value   Color, Urine STRAW (*)    APPearance CLEAR (*)    Glucose, UA 50 (*)    Hgb urine dipstick MODERATE (*)    Ketones, ur 20 (*)    Protein, ur 100 (*)    RBC / HPF >50 (*)    All other components within normal limits  CBC WITH DIFFERENTIAL/PLATELET - Abnormal; Notable for the following components:   WBC 11.2 (*)    Neutro Abs 9.9 (*)    All other components within normal limits  COMPREHENSIVE METABOLIC PANEL - Abnormal; Notable for the following  components:   Potassium 3.0 (*)    Glucose, Bld 148 (*)    Total Protein 8.7 (*)    All other components within normal limits  LIPASE, BLOOD  INFLUENZA PANEL BY PCR (TYPE A & B)  CBC WITH DIFFERENTIAL/PLATELET   ____________________________________________  EKG  ED ECG REPORT I, Anne-Caroline Mariea Clonts, the attending physician, personally viewed and interpreted this ECG.   Date: 11/23/2018  EKG Time: 732  Rate: 68  Rhythm: normal sinus rhythm  Axis: normal  Intervals:none  ST&T Change: No STEMI  ____________________________________________  RADIOLOGY  No results found.  ____________________________________________   PROCEDURES  Procedure(s) performed: None  Procedures  Critical Care performed: No ____________________________________________   INITIAL IMPRESSION / ASSESSMENT AND PLAN / ED COURSE  Pertinent labs & imaging results that were available during my care of the patient were reviewed by me and considered in  my medical decision making (see chart for details).  48 y.o. female with a history of hypertension presenting for nausea vomiting and diarrhea without abdominal pain or fever.  Overall, the patient is hypertensive but was unable to keep down her morning antihypertensives.  I will treat her with metoprolol.  The patient symptoms may be due to a foodborne or viral GI illness.  Given the time of year, I would also consider influenza although the patient has not been having cough or cold symptoms.  The patient's abdominal exam is very reassuring without any tenderness.  An acute intra-abdominal surgical pathology is less likely.  Gastroparesis is also considered as the patient states that she has had this before but she reports a full GI work-up without any diagnosis.  We will initiate symptomatic treatment and basic laboratory studies and reevaluate the patient for final disposition.  ----------------------------------------- 9:08 AM on 11/23/2018 -----------------------------------------  The patient is feeling significantly better at this time.  Her nausea has completely resolved and she has not had any more episodes of diarrhea here.  Her abdomen continues to be benign on my exam.  She does continue to have significant hypertension and did not take her antihypertensives this morning.  I will treat her additionally here, and have encouraged her to take her blood pressure medications as prescribed.  In addition, I have asked her to record her blood pressures at home and bring the record with her to her primary care physician to ensure that she is being adequately treated for her essential hypertension.    The patient's laboratory results are reassuring.  She has a nonspecific elevation of her white blood cell count 11.2; this is likely due to her nausea vomiting and diarrhea and again I have repeated her abdominal exam which does not have any focality that would be concerning for a severe intra-abdominal  infection.  Her influenza testing is negative.  She is mildly hypokalemic at 3.0 and has been supplemented; have asked her to get her potassium rechecked with her PMD.  The patient does not have any evidence of UTI.  At this time, the patient will be discharged home.  Follow-up instructions as well as return precautions were discussed.  ____________________________________________  FINAL CLINICAL IMPRESSION(S) / ED DIAGNOSES  Final diagnoses:  Hypertension, unspecified type  Nausea vomiting and diarrhea  Hypokalemia         NEW MEDICATIONS STARTED DURING THIS VISIT:  New Prescriptions   LOPERAMIDE (IMODIUM A-D) 2 MG TABLET    Take 1 tablet (2 mg total) by mouth 4 (four) times daily as needed for diarrhea or loose stools.  Eula Listen, MD 11/23/18 [phone]

## 2018-11-23 NOTE — ED Notes (Signed)
Pt given water and crackers for PO challenge

## 2018-12-04 ENCOUNTER — Ambulatory Visit: Payer: Medicare Other | Admitting: Psychology

## 2018-12-18 ENCOUNTER — Ambulatory Visit: Payer: Medicare Other | Admitting: Psychology

## 2019-01-01 ENCOUNTER — Ambulatory Visit: Payer: Medicare Other | Admitting: Psychology

## 2019-01-04 ENCOUNTER — Other Ambulatory Visit: Payer: Self-pay | Admitting: Neurology

## 2019-01-04 ENCOUNTER — Other Ambulatory Visit: Payer: Self-pay | Admitting: Family Medicine

## 2019-01-04 NOTE — Telephone Encounter (Signed)
CPE scheduled on 04/17/19, last filled on 03/27/18 #30 tabs with 5 additional refills, please advise

## 2019-01-08 ENCOUNTER — Other Ambulatory Visit: Payer: Self-pay | Admitting: *Deleted

## 2019-01-08 MED ORDER — ONDANSETRON 4 MG PO TBDP: 4.0000 mg | ORAL_TABLET | Freq: Three times a day (TID) | ORAL | 3 refills | Status: DC | PRN

## 2019-01-08 NOTE — Telephone Encounter (Signed)
Fax refill request, last prescribed at the hospital on 11/23/18 #12 tabs, CPE scheduled on 04/17/19, please advise   [place]

## 2019-01-08 NOTE — Telephone Encounter (Signed)
I refilled it

## 2019-01-10 ENCOUNTER — Emergency Department (HOSPITAL_COMMUNITY): Payer: Medicare Other

## 2019-01-10 ENCOUNTER — Encounter (HOSPITAL_COMMUNITY): Payer: Self-pay

## 2019-01-10 ENCOUNTER — Encounter: Payer: Self-pay | Admitting: Family Medicine

## 2019-01-10 ENCOUNTER — Emergency Department (HOSPITAL_COMMUNITY)
Admission: EM | Admit: 2019-01-10 | Discharge: 2019-01-10 | Disposition: A | Discharge status: Home / Self Care | Payer: Medicare Other | Attending: Emergency Medicine | Admitting: Emergency Medicine

## 2019-01-10 ENCOUNTER — Telehealth: Payer: Self-pay

## 2019-01-10 ENCOUNTER — Other Ambulatory Visit: Payer: Self-pay

## 2019-01-10 DIAGNOSIS — F17200 Nicotine dependence, unspecified, uncomplicated: Secondary | ICD-10-CM | POA: Diagnosis not present

## 2019-01-10 DIAGNOSIS — Y9389 Activity, other specified: Secondary | ICD-10-CM | POA: Diagnosis not present

## 2019-01-10 DIAGNOSIS — M79642 Pain in left hand: Secondary | ICD-10-CM

## 2019-01-10 DIAGNOSIS — Z79899 Other long term (current) drug therapy: Secondary | ICD-10-CM | POA: Insufficient documentation

## 2019-01-10 DIAGNOSIS — Y92481 Parking lot as the place of occurrence of the external cause: Secondary | ICD-10-CM | POA: Insufficient documentation

## 2019-01-10 DIAGNOSIS — I10 Essential (primary) hypertension: Secondary | ICD-10-CM | POA: Insufficient documentation

## 2019-01-10 DIAGNOSIS — M791 Myalgia, unspecified site: Secondary | ICD-10-CM | POA: Diagnosis not present

## 2019-01-10 DIAGNOSIS — Y999 Unspecified external cause status: Secondary | ICD-10-CM | POA: Diagnosis not present

## 2019-01-10 DIAGNOSIS — R42 Dizziness and giddiness: Secondary | ICD-10-CM | POA: Diagnosis not present

## 2019-01-10 DIAGNOSIS — S0990XA Unspecified injury of head, initial encounter: Secondary | ICD-10-CM

## 2019-01-10 DIAGNOSIS — S6992XA Unspecified injury of left wrist, hand and finger(s), initial encounter: Secondary | ICD-10-CM | POA: Diagnosis not present

## 2019-01-10 MED ORDER — ACETAMINOPHEN 500 MG PO TABS
1000.0000 mg | ORAL_TABLET | Freq: Once | ORAL | Status: AC
  Administered 2019-01-10: 1000 mg via ORAL
  Filled 2019-01-10: qty 2

## 2019-01-10 NOTE — Discharge Instructions (Signed)
Please see the information and instructions below regarding your visit.  Your diagnoses today include:  1. Injury of head, initial encounter   2. Left hand pain    Your imaging is normal today. It shows no signs of bleeding in the brain. Concussions are caused by acceleration/deceleration forces of the brain against the skull and in mild forms, cannot be seen on any imaging. The injury occurs at the microscopic level, and causes a disturbance more in function than the structure of the brain itself. Common symptoms of concussion include: ?Poor coordination such as stumbling or inability to walk in a straight line ?Vacant stare (befuddled facial expression) ?Delayed verbal expression (slower to answer questions or follow instructions) ?Inability to focus attention (easily distracted and unable to follow through with normal activities) ?Disorientation (walking in the wrong direction, unaware of time, date, place) ?Slurred or incoherent speech (making disjointed or incomprehensible statements) ?Emotionality out of proportion to circumstances (appearing distraught, crying for no apparent reason) ?Memory deficits (exhibited by patient repeatedly asking the same question that has already been answered or inability to recall three of three words after five minutes)  Tests performed today include: CT scan showed no bleeding in the brain. See side panel of your discharge paperwork for testing performed today. Vital signs are listed at the bottom of these instructions.   Medications prescribed:    Avoid aspirin. Take only ibuprofen (Advil) or naproxen (Aleve), and acetaminophen (Tylenol).  Take any prescribed medications only as prescribed, and any over the counter medications only as directed on the packaging.  Home care instructions:  Please follow any educational materials contained in this packet.    Keep head elevated at all times for the first 24 hours (Elevate mattress if pillow is  ineffective)  Do not take tranquilizers, sedatives, narcotics or alcohol  Use ice packs for comfort.  Follow-up instructions: Please follow-up with your primary care provider within one week for further evaluation of your symptoms if they are not completely improved.   Return instructions:  Please return to the Emergency Department if you experience worsening symptoms.   If any of the following occur notify your physician or go to the Hospital Emergency Department:  Increased drowsiness, confusion, or loss of consciousness  Restlessness or convulsions (fits)  Paralysis in arms or legs  Temperature above 100 F  Vomiting  Severe headache  Blood or clear fluid dripping from the nose or ears  Stiffness of the neck  Dizziness or blurred vision  Pulsating pain in the eye  Unequal pupils of eye  Personality changes  Any other unusual symptoms  Please return if you have any other emergent concerns.  Additional Information:   Your vital signs today were: BP (!) 143/105 (BP Location: Right Arm)    Pulse 86    Temp 98.9 F (37.2 C) (Oral)    Resp 14    Wt 89 kg    SpO2 100%    BMI 35.89 kg/m  If your blood pressure (BP) was elevated on multiple readings during this visit above 130 for the top number or above 80 for the bottom number, please have this repeated by your primary care provider within one month. --------------  Thank you for allowing [place] to participate in your care today. It was my pleasure to care for you!
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
## 2019-01-14 ENCOUNTER — Ambulatory Visit: Payer: Medicare Other | Admitting: Psychology

## 2019-01-15 ENCOUNTER — Ambulatory Visit (INDEPENDENT_AMBULATORY_CARE_PROVIDER_SITE_OTHER): Payer: Medicare Other | Admitting: Psychology

## 2019-01-15 ENCOUNTER — Other Ambulatory Visit: Payer: Self-pay

## 2019-01-15 DIAGNOSIS — F331 Major depressive disorder, recurrent, moderate: Secondary | ICD-10-CM

## 2019-01-17 ENCOUNTER — Ambulatory Visit (INDEPENDENT_AMBULATORY_CARE_PROVIDER_SITE_OTHER): Payer: Medicare Other | Admitting: Family Medicine

## 2019-01-17 ENCOUNTER — Other Ambulatory Visit: Payer: Self-pay

## 2019-01-17 ENCOUNTER — Ambulatory Visit: Payer: Medicare Other | Admitting: Family Medicine

## 2019-01-17 ENCOUNTER — Encounter: Payer: Self-pay | Admitting: Family Medicine

## 2019-01-17 VITALS — BP 116/76 | HR 59 | Temp 98.1°F | Ht 62.5 in | Wt 202.4 lb

## 2019-01-17 DIAGNOSIS — F0781 Postconcussional syndrome: Secondary | ICD-10-CM | POA: Diagnosis not present

## 2019-01-17 DIAGNOSIS — G5603 Carpal tunnel syndrome, bilateral upper limbs: Secondary | ICD-10-CM | POA: Diagnosis not present

## 2019-01-17 DIAGNOSIS — S0990XA Unspecified injury of head, initial encounter: Secondary | ICD-10-CM | POA: Insufficient documentation

## 2019-01-17 DIAGNOSIS — S0990XS Unspecified injury of head, sequela: Secondary | ICD-10-CM

## 2019-01-17 NOTE — Patient Instructions (Signed)
I think you have a post concussive syndrome - chronic after a head injury   Ice/heat are ok to head or neck  When headache/dizziness/mental fuzziness worsen - rest your brain (from screens/reading/math/work)  You will be able to gradually increase time per day you can do brain (cognitive) work   Take care of yourself   If your symptoms suddenly worsen-please alert [place] and get to ER if necessary
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
## 2019-01-21 ENCOUNTER — Encounter: Payer: Self-pay | Admitting: Family Medicine

## 2019-01-21 MED ORDER — VALACYCLOVIR HCL 500 MG PO TABS: 500.0000 mg | ORAL_TABLET | Freq: Every day | ORAL | 5 refills | Status: DC

## 2019-01-25 DIAGNOSIS — M542 Cervicalgia: Secondary | ICD-10-CM | POA: Diagnosis not present

## 2019-01-25 DIAGNOSIS — G5603 Carpal tunnel syndrome, bilateral upper limbs: Secondary | ICD-10-CM | POA: Diagnosis not present

## 2019-01-28 ENCOUNTER — Encounter: Payer: Self-pay | Admitting: Family Medicine

## 2019-01-28 ENCOUNTER — Ambulatory Visit (INDEPENDENT_AMBULATORY_CARE_PROVIDER_SITE_OTHER)
Admission: RE | Admit: 2019-01-28 | Discharge: 2019-01-28 | Disposition: A | Discharge status: Home / Self Care | Payer: Medicare Other | Source: Ambulatory Visit | Attending: Family Medicine | Admitting: Family Medicine

## 2019-01-28 ENCOUNTER — Other Ambulatory Visit: Payer: Self-pay

## 2019-01-28 DIAGNOSIS — G44311 Acute post-traumatic headache, intractable: Secondary | ICD-10-CM

## 2019-01-28 DIAGNOSIS — G44319 Acute post-traumatic headache, not intractable: Secondary | ICD-10-CM | POA: Insufficient documentation

## 2019-01-28 DIAGNOSIS — R51 Headache: Secondary | ICD-10-CM | POA: Diagnosis not present
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
## 2019-01-29 ENCOUNTER — Ambulatory Visit (INDEPENDENT_AMBULATORY_CARE_PROVIDER_SITE_OTHER): Payer: Medicare Other | Admitting: Psychology

## 2019-01-29 ENCOUNTER — Ambulatory Visit: Payer: Medicare Other | Admitting: Psychology

## 2019-01-29 DIAGNOSIS — F331 Major depressive disorder, recurrent, moderate: Secondary | ICD-10-CM

## 2019-01-30 DIAGNOSIS — G43709 Chronic migraine without aura, not intractable, without status migrainosus: Secondary | ICD-10-CM | POA: Diagnosis not present

## 2019-02-12 ENCOUNTER — Ambulatory Visit (INDEPENDENT_AMBULATORY_CARE_PROVIDER_SITE_OTHER): Payer: Medicare Other | Admitting: Psychology

## 2019-02-12 DIAGNOSIS — F331 Major depressive disorder, recurrent, moderate: Secondary | ICD-10-CM

## 2019-02-13 DIAGNOSIS — G43709 Chronic migraine without aura, not intractable, without status migrainosus: Secondary | ICD-10-CM | POA: Diagnosis not present

## 2019-02-13 NOTE — Telephone Encounter (Signed)
Pt seen 01/17/19

## 2019-02-26 ENCOUNTER — Ambulatory Visit (INDEPENDENT_AMBULATORY_CARE_PROVIDER_SITE_OTHER): Payer: Medicare Other | Admitting: Psychology

## 2019-02-26 DIAGNOSIS — F331 Major depressive disorder, recurrent, moderate: Secondary | ICD-10-CM

## 2019-03-06 ENCOUNTER — Encounter: Payer: Self-pay | Admitting: Family Medicine

## 2019-03-06 MED ORDER — MIRABEGRON ER 25 MG PO TB24: 25.0000 mg | ORAL_TABLET | Freq: Every day | ORAL | 2 refills | Status: DC

## 2019-03-12 ENCOUNTER — Ambulatory Visit: Payer: Medicare Other | Admitting: Psychology

## 2019-03-15 ENCOUNTER — Encounter: Payer: Self-pay | Admitting: Family Medicine

## 2019-03-26 ENCOUNTER — Ambulatory Visit (INDEPENDENT_AMBULATORY_CARE_PROVIDER_SITE_OTHER): Payer: Medicare Other | Admitting: Psychology

## 2019-03-26 DIAGNOSIS — F331 Major depressive disorder, recurrent, moderate: Secondary | ICD-10-CM

## 2019-04-09 ENCOUNTER — Telehealth: Payer: Self-pay | Admitting: Family Medicine

## 2019-04-09 ENCOUNTER — Ambulatory Visit: Payer: Medicare Other | Admitting: Psychology

## 2019-04-09 DIAGNOSIS — E538 Deficiency of other specified B group vitamins: Secondary | ICD-10-CM

## 2019-04-09 DIAGNOSIS — E559 Vitamin D deficiency, unspecified: Secondary | ICD-10-CM

## 2019-04-09 DIAGNOSIS — I10 Essential (primary) hypertension: Secondary | ICD-10-CM

## 2019-04-09 DIAGNOSIS — E049 Nontoxic goiter, unspecified: Secondary | ICD-10-CM

## 2019-04-09 DIAGNOSIS — M1000 Idiopathic gout, unspecified site: Secondary | ICD-10-CM

## 2019-04-09 DIAGNOSIS — R7303 Prediabetes: Secondary | ICD-10-CM

## 2019-04-09 NOTE — Telephone Encounter (Signed)
-----   Message from Cloyd Stagers, RT sent at 04/09/2019  1:16 PM EDT ----- Regarding: Lab Orders for Wednesday 6.10.2020 Please place lab orders for Wednesday 6.10.2020, office visit for physical on Wednesday 6.17.2020 Thank you, Dyke Maes RT(R)

## 2019-04-10 ENCOUNTER — Ambulatory Visit (INDEPENDENT_AMBULATORY_CARE_PROVIDER_SITE_OTHER): Payer: Medicare Other

## 2019-04-10 ENCOUNTER — Other Ambulatory Visit (INDEPENDENT_AMBULATORY_CARE_PROVIDER_SITE_OTHER): Payer: Medicare Other

## 2019-04-10 DIAGNOSIS — I10 Essential (primary) hypertension: Secondary | ICD-10-CM

## 2019-04-10 DIAGNOSIS — E559 Vitamin D deficiency, unspecified: Secondary | ICD-10-CM

## 2019-04-10 DIAGNOSIS — E538 Deficiency of other specified B group vitamins: Secondary | ICD-10-CM

## 2019-04-10 DIAGNOSIS — R7303 Prediabetes: Secondary | ICD-10-CM | POA: Diagnosis not present

## 2019-04-10 DIAGNOSIS — Z0000 Encounter for general adult medical examination without abnormal findings: Secondary | ICD-10-CM | POA: Diagnosis not present

## 2019-04-10 LAB — LIPID PANEL
Cholesterol: 119 mg/dL (ref 0–200)
HDL: 39.7 mg/dL (ref >39.00)
LDL Cholesterol: 68 mg/dL (ref 0–99)
NonHDL: 79.65
Total CHOL/HDL Ratio: 3
Triglycerides: 56 mg/dL (ref 0.0–149.0)
VLDL: 11.2 mg/dL (ref 0.0–40.0)

## 2019-04-10 LAB — CBC WITH DIFFERENTIAL/PLATELET
Basophils Absolute: 0 10*3/uL (ref 0.0–0.1)
Basophils Relative: 0.2 % (ref 0.0–3.0)
Eosinophils Absolute: 0.1 10*3/uL (ref 0.0–0.7)
Eosinophils Relative: 1.7 % (ref 0.0–5.0)
HCT: 41.9 % (ref 36.0–46.0)
Hemoglobin: 14.1 g/dL (ref 12.0–15.0)
Lymphocytes Relative: 39.5 % (ref 12.0–46.0)
Lymphs Abs: 2.3 10*3/uL (ref 0.7–4.0)
MCHC: 33.7 g/dL (ref 30.0–36.0)
MCV: 98.5 fl (ref 78.0–100.0)
Monocytes Absolute: 0.4 10*3/uL (ref 0.1–1.0)
Monocytes Relative: 6.2 % (ref 3.0–12.0)
Neutro Abs: 3.1 10*3/uL (ref 1.4–7.7)
Neutrophils Relative %: 52.4 % (ref 43.0–77.0)
Platelets: 163 10*3/uL (ref 150.0–400.0)
RBC: 4.25 Mil/uL (ref 3.87–5.11)
RDW: 14 % (ref 11.5–15.5)
WBC: 5.9 10*3/uL (ref 4.0–10.5)

## 2019-04-10 LAB — COMPREHENSIVE METABOLIC PANEL
ALT: 10 U/L (ref 0–35)
AST: 13 U/L (ref 0–37)
Albumin: 4.3 g/dL (ref 3.5–5.2)
Alkaline Phosphatase: 49 U/L (ref 39–117)
BUN: 14 mg/dL (ref 6–23)
CO2: 23 mEq/L (ref 19–32)
Calcium: 9.2 mg/dL (ref 8.4–10.5)
Chloride: 110 mEq/L (ref 96–112)
Creatinine, Ser: 1.11 mg/dL (ref 0.40–1.20)
GFR: 63.33 mL/min (ref >60.00)
Glucose, Bld: 127 mg/dL — ABNORMAL HIGH (ref 70–99)
Potassium: 4.1 mEq/L (ref 3.5–5.1)
Sodium: 140 mEq/L (ref 135–145)
Total Bilirubin: 0.6 mg/dL (ref 0.2–1.2)
Total Protein: 7 g/dL (ref 6.0–8.3)

## 2019-04-10 LAB — HEMOGLOBIN A1C: Hgb A1c MFr Bld: 6 % (ref 4.6–6.5)

## 2019-04-10 LAB — VITAMIN B12: Vitamin B-12: 471 pg/mL (ref 211–911)

## 2019-04-10 LAB — VITAMIN D 25 HYDROXY (VIT D DEFICIENCY, FRACTURES): VITD: 23.45 ng/mL — ABNORMAL LOW (ref 30.00–100.00)

## 2019-04-10 LAB — TSH: TSH: 0.88 u[IU]/mL (ref 0.35–4.50)

## 2019-04-10 NOTE — Progress Notes (Signed)
Subjective:   Dana Greer is a 48 y.o. female who presents for Medicare Annual (Subsequent) preventive examination.  Review of Systems:  N/A Cardiac Risk Factors include: obesity (BMI >30kg/m2);hypertension     Objective:     Vitals: There were no vitals taken for this visit.  There is no height or weight on file to calculate BMI.  Advanced Directives 04/10/2019 01/10/2019 11/23/2018 09/25/2018 09/18/2018 03/21/2018 10/31/2017  Does Patient Have a Medical Advance Directive? No No No No No No No  Type of Advance Directive - - - - - - -  Copy of Healthcare Power of Attorney in Chart? - - - - - - -  Would patient like information on creating a medical advance directive? No - Patient declined No - Patient declined No - Patient declined No - Patient declined No - Patient declined No - Patient declined -    Tobacco Social History   Tobacco Use  Smoking Status Never Smoker  Smokeless Tobacco Never Used  Tobacco Comment   Pt states she smokes.....but "not cigarettes"     Counseling given: No Comment: Pt states she smokes.....but "not cigarettes"   Clinical Intake:  Pre-visit preparation completed: Yes  Pain Score: 9      Nutritional Status: BMI > 30  Obese Nutritional Risks: None  What is the last grade level you completed in school?: 12th grade + some college  Interpreter Needed?: No  Information entered by :: LPinson, LPN  Past Medical History:  Diagnosis Date  . Arthritis   . Depression   . Fibromyalgia   . Headache(784.0)   . HSV-2 infection   . Hypertension   . Immune deficiency disorder ([place])   . Shingles   . Stroke ([place])   . Thrombocytopenia ([place]) 10/01/2012   Past Surgical History:  Procedure Laterality Date  . ABDOMINAL HYSTERECTOMY    . CARPAL TUNNEL RELEASE Left 09/25/2018   Procedure: LEFT CARPAL TUNNEL RELEASE;  Surgeon: Daryll Brod, MD;  Location: [facility];  Service: Orthopedics;  Laterality: Left;  . CESAREAN SECTION   2008   x1  . PARTIAL HYSTERECTOMY  2012   due to fibroid  . UPPER GASTROINTESTINAL ENDOSCOPY    . WISDOM TOOTH EXTRACTION  1995   Family History  Problem Relation Age of Onset  . Diabetes Father   . COPD Father   . Healthy Mother   . Breast cancer Paternal Aunt   . Colon cancer Paternal Grandmother    Social History   Socioeconomic History  . Marital status: Legally Separated    Spouse name: Not on file  . Number of children: 3  . Years of education: college  . Highest education level: Not on file  Occupational History  . Occupation: Unemployed  Social Needs  . Financial resource strain: Not on file  . Food insecurity:    Worry: Not on file    Inability: Not on file  . Transportation needs:    Medical: Not on file    Non-medical: Not on file  Tobacco Use  . Smoking status: Never Smoker  . Smokeless tobacco: Never Used  . Tobacco comment: Pt states she smokes.....but "not cigarettes"  Substance and Sexual Activity  . Alcohol use: Yes    Comment: occasional  . Drug use: Yes    Types: Marijuana    Comment: smokes small amount twice daily  . Sexual activity: Yes    Birth control/protection: None  Lifestyle  . Physical activity:  Days per week: Not on file    Minutes per session: Not on file  . Stress: Not on file  Relationships  . Social connections:    Talks on phone: Not on file    Gets together: Not on file    Attends religious service: Not on file    Active member of club or organization: Not on file    Attends meetings of clubs or organizations: Not on file    Relationship status: Not on file  Other Topics Concern  . Not on file  Social History Narrative   Lives at home with her children.   6 cups caffeine per day.   Right-handed.    Outpatient Encounter Medications as of 04/10/2019  Medication Sig  . ALPRAZolam (XANAX) 1 MG tablet Take 1 mg by mouth QID.   Marland Kitchen amitriptyline (ELAVIL) 25 MG tablet Take 2 tablets (50 mg total) by mouth daily.  .  cholecalciferol (VITAMIN D3) 25 MCG (1000 UT) tablet Take 1,000 Units by mouth daily.  . cloNIDine (CATAPRES) 0.2 MG tablet Take 0.2 mg by mouth daily.  . colchicine 0.6 MG tablet Take 1 tablet (0.6 mg total) by mouth 2 (two) times daily. Prn gout  . diclofenac (VOLTAREN) 75 MG EC tablet Take 75 mg by mouth at bedtime as needed.  . etonogestrel (IMPLANON) 68 MG IMPL implant Inject 1 each into the skin continuous. Reported on 01/27/2016  . fluticasone (FLONASE) 50 MCG/ACT nasal spray Place 2 sprays into both nostrils daily.  Marland Kitchen gabapentin (NEURONTIN) 300 MG capsule TAKE 1 OR 2 CAPSULES BY MOUTH 3 TIMES A DAY (Patient taking differently: Take 600 mg by mouth 3 (three) times daily. )  . HYDROcodone-acetaminophen (NORCO) 5-325 MG tablet Take 1 tablet by mouth every 6 (six) hours as needed.  . loperamide (IMODIUM A-D) 2 MG tablet Take 1 tablet (2 mg total) by mouth 4 (four) times daily as needed for diarrhea or loose stools.  . meclizine (ANTIVERT) 25 MG tablet Take 25 mg by mouth 3 (three) times daily as needed for dizziness.  . meloxicam (MOBIC) 15 MG tablet Take 15 mg by mouth daily as needed.  . mirabegron ER (MYRBETRIQ) 25 MG TB24 tablet Take 1 tablet (25 mg total) by mouth daily.  . ondansetron (ZOFRAN ODT) 4 MG disintegrating tablet Take 1 tablet (4 mg total) by mouth every 8 (eight) hours as needed for nausea or vomiting.  Marland Kitchen PARoxetine (PAXIL) 40 MG tablet Take 1 tablet (40 mg total) by mouth daily. (Patient taking differently: Take 40 mg by mouth 2 (two) times daily. )  . promethazine (PHENERGAN) 25 MG tablet TAKE 1 TABLET BY MOUTH EVERY 8 HOURS AS NEEDED FOR NAUSEA AND VOMITING  . tetrahydrozoline 0.05 % ophthalmic solution Place 1 drop into both eyes daily as needed (for dry eyes).  Marland Kitchen tinidazole (TINDAMAX) 500 MG tablet Take 1,000 mg by mouth daily.  Marland Kitchen topiramate (TOPAMAX) 100 MG tablet Take 1 tablet (100 mg total) by mouth 2 (two) times daily.  . valACYclovir (VALTREX) 500 MG tablet Take 1  tablet (500 mg total) by mouth daily.  . Vitamin D, Ergocalciferol, (DRISDOL) 50000 units CAPS capsule Take 1 capsule (50,000 Units total) by mouth every 7 (seven) days. For 12 weeks   No facility-administered encounter medications on file as of 04/10/2019.     Activities of Daily Living In your present state of health, do you have any difficulty performing the following activities: 04/10/2019 09/25/2018  Hearing? N N  Vision? N  N  Difficulty concentrating or making decisions? N N  Walking or climbing stairs? N N  Dressing or bathing? N N  Doing errands, shopping? N -  Preparing Food and eating ? N -  Using the Toilet? N -  In the past six months, have you accidently leaked urine? N -  Do you have problems with loss of bowel control? N -  Managing your Medications? N -  Managing your Finances? N -  Housekeeping or managing your Housekeeping? N -  Some recent data might be hidden    Patient Care Team: [name], Wynelle Fanny, MD as PCP - General (Family Medicine) Marcial Pacas, MD as Attending Physician (Neurology)    Assessment:   This is a routine wellness examination for [place].  Vision Screening Comments: [facility] exam in 2019 [facility]  Exercise Activities and Dietary recommendations Current Exercise Habits: Home exercise routine, Type of exercise: yoga, Time (Minutes): 30, Frequency (Times/Week): 7, Weekly Exercise (Minutes/Week): 210, Intensity: Mild, Exercise limited by: None identified  Goals    . Increase physical activity     Starting 04/10/2019, I will continue to exercise for 30 minutes daily.     . Patient Stated     Starting 03/21/2018, I will continue to meditate and to do yoga for 60 minutes when pain level is above 5.        Fall Risk Fall Risk  04/10/2019 03/21/2018 10/21/2016 01/27/2016  Falls in the past year? 0 No No No   Depression Screen PHQ 2/9 Scores 04/10/2019 03/21/2018 10/21/2016 01/27/2016  PHQ - 2 Score 0 5 5 0  PHQ- 9 Score 0 12 12 -     Cognitive Function  MMSE - Mini Mental State Exam 04/10/2019 03/21/2018 10/21/2016  Orientation to time 5 5 5   Orientation to Place 5 5 5   Registration 3 3 3   Attention/ Calculation 0 0 0  Recall 3 3 3   Language- name 2 objects 0 0 0  Language- repeat 1 1 1   Language- follow 3 step command 0 3 3  Language- read & follow direction 0 0 0  Write a sentence 0 0 0  Copy design 0 0 0  Total score 17 20 20        PLEASE NOTE: A Mini-Cog screen was completed. Maximum score is 17. A value of 0 denotes this part of [place] MMSE was not completed or the patient failed this part of the Mini-Cog screening.   Mini-Cog Screening Orientation to Time - Max 5 pts Orientation to Place - Max 5 pts Registration - Max 3 pts Recall - Max 3 pts Language Repeat - Max 1 pts    Immunization History  Administered Date(s) Administered  . Influenza,inj,Quad PF,6+ Mos 11/25/2015, 10/25/2016, 01/09/2018    Screening Tests Health Maintenance  Topic Date Due  . MAMMOGRAM  11/15/2018  . TETANUS/TDAP  10/21/2026 (Originally 09/18/1989)  . INFLUENZA VACCINE  06/01/2019  . HIV Screening  Completed      Plan:     I have personally reviewed, addressed, and noted the following in the patient's chart:  A. Medical and social history B. Use of alcohol, tobacco or illicit drugs  C. Current medications and supplements D. Functional ability and status E.  Nutritional status F.  Physical activity G. Advance directives H. List of other physicians I.  Hospitalizations, surgeries, and ER visits in previous 12 months J.  Vitals (unless it is a telemedicine encounter) K. Screenings to include cognitive, depression, hearing, vision (NOTE: hearing  and vision screenings not completed in telemedicine encounter) L. Referrals and appointments   In addition, I have reviewed and discussed with patient certain preventive protocols, quality metrics, and best practice recommendations. A written personalized care plan for preventive services and  recommendations were provided to patient.  With patient's permission, we connected on 04/10/19 at  9:00 AM EDT. Interactive audio and video telecommunications were attempted with patient. This attempt was unsuccessful due to patient having technical difficulties OR patient did not have access to video capability.  Encounter was completed with audio only.  Two patient identifiers were used to ensure the encounter occurred with the correct person. Patient was in home and writer was in office.   Signed,   Lindell Noe, MHA, BS, RN Health Coach

## 2019-04-10 NOTE — Patient Instructions (Signed)
Dana Greer , Thank you for taking time to come for your Medicare Wellness Visit. I appreciate your ongoing commitment to your health goals. Please review the following plan we discussed and let me know if I can assist you in the future.   These are the goals we discussed: Goals    . Increase physical activity     Starting 04/10/2019, I will continue to exercise for 30 minutes daily.                 This is a list of the screening recommended for you and due dates:  Health Maintenance  Topic Date Due  . Mammogram  11/15/2018  . Tetanus Vaccine  10/21/2026*  . Flu Shot  06/01/2019  . HIV Screening  Completed  *Topic was postponed. The date shown is not the original due date.   Preventive Care for Adults  A healthy lifestyle and preventive care can promote health and wellness. Preventive health guidelines for adults include the following key practices.  . A routine yearly physical is a good way to check with your health care provider about your health and preventive screening. It is a chance to share any concerns and updates on your health and to receive a thorough exam.  . Visit your dentist for a routine exam and preventive care every 6 months. Brush your teeth twice a day and floss once a day. Good oral hygiene prevents tooth decay and gum disease.  . The frequency of eye exams is based on your age, health, family medical history, use  of contact lenses, and other factors. Follow your health care provider's recommendations for frequency of eye exams.  . Eat a healthy diet. Foods like vegetables, fruits, whole grains, low-fat dairy products, and lean protein foods contain the nutrients you need without too many calories. Decrease your intake of foods high in solid fats, added sugars, and salt. Eat the right amount of calories for you. Get information about a proper diet from your health care provider, if necessary.  . Regular physical exercise is one of the most important things  you can do for your health. Most adults should get at least 150 minutes of moderate-intensity exercise (any activity that increases your heart rate and causes you to sweat) each week. In addition, most adults need muscle-strengthening exercises on 2 or more days a week.  Silver Sneakers may be a benefit available to you. To determine eligibility, you may visit the website: www.silversneakers.com or contact program at [phone] Mon-Fri between 8AM-8PM.   . Maintain a healthy weight. The body mass index (BMI) is a screening tool to identify possible weight problems. It provides an estimate of body fat based on height and weight. Your health care provider can find your BMI and can help you achieve or maintain a healthy weight.   For adults 20 years and older: ? A BMI below 18.5 is considered underweight. ? A BMI of 18.5 to 24.9 is normal. ? A BMI of 25 to 29.9 is considered overweight. ? A BMI of 30 and above is considered obese.   . Maintain normal blood lipids and cholesterol levels by exercising and minimizing your intake of saturated fat. Eat a balanced diet with plenty of fruit and vegetables. Blood tests for lipids and cholesterol should begin at age 68 and be repeated every 5 years. If your lipid or cholesterol levels are high, you are over 50, or you are at high risk for heart disease, you may need  your cholesterol levels checked more frequently. Ongoing high lipid and cholesterol levels should be treated with medicines if diet and exercise are not working.  . If you smoke, find out from your health care provider how to quit. If you do not use tobacco, please do not start.  . If you choose to drink alcohol, please do not consume more than 2 drinks per day. One drink is considered to be 12 ounces (355 mL) of beer, 5 ounces (148 mL) of wine, or 1.5 ounces (44 mL) of liquor.  . If you are 40-91 years old, ask your health care provider if you should take aspirin to prevent strokes.  . Use  sunscreen. Apply sunscreen liberally and repeatedly throughout the day. You should seek shade when your shadow is shorter than you. Protect yourself by wearing long sleeves, pants, a wide-brimmed hat, and sunglasses year round, whenever you are outdoors.  . Once a month, do a whole body skin exam, using a mirror to look at the skin on your back. Tell your health care provider of new moles, moles that have irregular borders, moles that are larger than a pencil eraser, or moles that have changed in shape or color.

## 2019-04-10 NOTE — Progress Notes (Signed)
PCP notes:   Health maintenance:  Mammogram - postponed  Abnormal screenings:   None  Patient concerns:   Headache pain - 9/10. States she is going to contact specialist for pain management.   Nurse concerns:  None  Next PCP appt:   04/17/19 @ 1430  I reviewed health advisor's note, was available for consultation, and agree with documentation and plan. Loura Pardon MD

## 2019-04-16 DIAGNOSIS — Z6838 Body mass index (BMI) 38.0-38.9, adult: Secondary | ICD-10-CM | POA: Diagnosis not present

## 2019-04-16 DIAGNOSIS — Z124 Encounter for screening for malignant neoplasm of cervix: Secondary | ICD-10-CM | POA: Diagnosis not present

## 2019-04-16 DIAGNOSIS — N951 Menopausal and female climacteric states: Secondary | ICD-10-CM | POA: Diagnosis not present

## 2019-04-16 DIAGNOSIS — Z01419 Encounter for gynecological examination (general) (routine) without abnormal findings: Secondary | ICD-10-CM | POA: Diagnosis not present

## 2019-04-16 DIAGNOSIS — N939 Abnormal uterine and vaginal bleeding, unspecified: Secondary | ICD-10-CM | POA: Diagnosis not present

## 2019-04-17 ENCOUNTER — Encounter: Payer: Self-pay | Admitting: Family Medicine

## 2019-04-17 NOTE — Progress Notes (Deleted)
   Subjective:    Patient ID: Dana Greer, female    DOB: 1971-03-22, 48 y.o.   MRN: [id number]  HPI Pt presents for annual f/u of chronic health problems   Weight   amw 6/10 Mammogram postponed for the covid pandemic Also tetanus shot due to insurance non coverage  Mammogram 1/19  Self breast exam   Gyn care-has implanon  PCOS  Migraines and myofascial pain syndrome   HTN   Lab Results  Component Value Date   CREATININE 1.11 04/10/2019   BUN 14 04/10/2019   NA 140 04/10/2019   K 4.1 04/10/2019   CL 110 04/10/2019   CO2 23 04/10/2019   Lab Results  Component Value Date   ALT 10 04/10/2019   AST 13 04/10/2019   ALKPHOS 49 04/10/2019   BILITOT 0.6 04/10/2019   Lab Results  Component Value Date   WBC 5.9 04/10/2019   HGB 14.1 04/10/2019   HCT 41.9 04/10/2019   MCV 98.5 04/10/2019   PLT 163.0 04/10/2019   Lab Results  Component Value Date   TSH 0.88 04/10/2019       Continues to see psychiatry for anxiety /depression   B12 def Lab Results  Component Value Date   IOMBTDHR41 638 04/10/2019   Taking daily oral B12  Prediabetes Lab Results  Component Value Date   HGBA1C 6.0 04/10/2019   This is up from 5.4  Low vit D Level 23.4 Much improved from level of 7 a year ago Supplementation    Cholesterol Lab Results  Component Value Date   CHOL 119 04/10/2019   CHOL 129 03/21/2018   CHOL 101 10/21/2016   Lab Results  Component Value Date   HDL 39.70 04/10/2019   HDL 58.40 03/21/2018   HDL 44.50 10/21/2016   Lab Results  Component Value Date   LDLCALC 68 04/10/2019   LDLCALC 61 03/21/2018   LDLCALC 49 10/21/2016   Lab Results  Component Value Date   TRIG 56.0 04/10/2019   TRIG 45.0 03/21/2018   TRIG 37.0 10/21/2016   Lab Results  Component Value Date   CHOLHDL 3 04/10/2019   CHOLHDL 2 03/21/2018   CHOLHDL 2 10/21/2016   No results found for: LDLDIRECT    Review of Systems     Objective:   Physical Exam         Assessment & Plan:

## 2019-04-23 ENCOUNTER — Ambulatory Visit (INDEPENDENT_AMBULATORY_CARE_PROVIDER_SITE_OTHER): Payer: Medicare Other | Admitting: Psychology

## 2019-04-23 DIAGNOSIS — F331 Major depressive disorder, recurrent, moderate: Secondary | ICD-10-CM | POA: Diagnosis not present

## 2019-04-30 ENCOUNTER — Other Ambulatory Visit: Payer: Self-pay | Admitting: Neurology

## 2019-04-30 DIAGNOSIS — Z9889 Other specified postprocedural states: Secondary | ICD-10-CM | POA: Diagnosis not present

## 2019-04-30 DIAGNOSIS — Z3046 Encounter for surveillance of implantable subdermal contraceptive: Secondary | ICD-10-CM | POA: Diagnosis not present

## 2019-05-01 ENCOUNTER — Other Ambulatory Visit: Payer: Self-pay | Admitting: Family Medicine

## 2019-05-02 NOTE — Telephone Encounter (Signed)
AWV done on 04/10/19, zofran last filled on 01/08/19 #30 tabs with 3 refills, please advise

## 2019-05-07 ENCOUNTER — Ambulatory Visit (INDEPENDENT_AMBULATORY_CARE_PROVIDER_SITE_OTHER): Payer: Medicare Other | Admitting: Psychology

## 2019-05-07 DIAGNOSIS — F331 Major depressive disorder, recurrent, moderate: Secondary | ICD-10-CM | POA: Diagnosis not present

## 2019-05-21 ENCOUNTER — Ambulatory Visit (INDEPENDENT_AMBULATORY_CARE_PROVIDER_SITE_OTHER): Payer: Medicare Other | Admitting: Psychology

## 2019-05-21 DIAGNOSIS — F331 Major depressive disorder, recurrent, moderate: Secondary | ICD-10-CM | POA: Diagnosis not present

## 2019-05-22 ENCOUNTER — Encounter: Payer: Self-pay | Admitting: Family Medicine

## 2019-06-04 ENCOUNTER — Ambulatory Visit (INDEPENDENT_AMBULATORY_CARE_PROVIDER_SITE_OTHER): Payer: Medicare Other | Admitting: Psychology

## 2019-06-04 DIAGNOSIS — F331 Major depressive disorder, recurrent, moderate: Secondary | ICD-10-CM | POA: Diagnosis not present

## 2019-06-13 DIAGNOSIS — G43709 Chronic migraine without aura, not intractable, without status migrainosus: Secondary | ICD-10-CM | POA: Diagnosis not present

## 2019-06-18 ENCOUNTER — Ambulatory Visit (INDEPENDENT_AMBULATORY_CARE_PROVIDER_SITE_OTHER): Payer: Medicare Other | Admitting: Psychology

## 2019-06-18 DIAGNOSIS — F331 Major depressive disorder, recurrent, moderate: Secondary | ICD-10-CM

## 2019-06-19 ENCOUNTER — Encounter: Payer: Self-pay | Admitting: Family Medicine

## 2019-07-02 ENCOUNTER — Ambulatory Visit (INDEPENDENT_AMBULATORY_CARE_PROVIDER_SITE_OTHER): Payer: Medicare Other | Admitting: Psychology

## 2019-07-02 DIAGNOSIS — F331 Major depressive disorder, recurrent, moderate: Secondary | ICD-10-CM

## 2019-07-16 ENCOUNTER — Ambulatory Visit (INDEPENDENT_AMBULATORY_CARE_PROVIDER_SITE_OTHER): Payer: Medicare Other | Admitting: Psychology

## 2019-07-16 DIAGNOSIS — F331 Major depressive disorder, recurrent, moderate: Secondary | ICD-10-CM

## 2019-07-29 ENCOUNTER — Other Ambulatory Visit: Payer: Self-pay | Admitting: Neurology

## 2019-07-29 ENCOUNTER — Other Ambulatory Visit: Payer: Self-pay | Admitting: Family Medicine

## 2019-07-29 NOTE — Telephone Encounter (Signed)
ER f/u was done on 01/17/19 no future appts.,  Phenergan filled on 01/04/19 #30 tabs with 5 refills  Valtrex filled on 01/21/19 #30 tabs with 5 refills  Please advise

## 2019-07-30 ENCOUNTER — Ambulatory Visit: Payer: Medicare Other | Admitting: Psychology

## 2019-08-09 ENCOUNTER — Encounter: Payer: Self-pay | Admitting: Family Medicine

## 2019-08-09 DIAGNOSIS — J358 Other chronic diseases of tonsils and adenoids: Secondary | ICD-10-CM | POA: Diagnosis not present

## 2019-08-09 DIAGNOSIS — J3089 Other allergic rhinitis: Secondary | ICD-10-CM | POA: Diagnosis not present

## 2019-08-12 ENCOUNTER — Ambulatory Visit: Payer: Medicare Other | Admitting: Family Medicine

## 2019-08-12 ENCOUNTER — Telehealth: Payer: Self-pay

## 2019-08-12 NOTE — Telephone Encounter (Signed)
appt was already cancelled this morning with notation pt was seen at [facility].

## 2019-08-12 NOTE — Telephone Encounter (Signed)
[facility] Night - Client Nonclinical Telephone Record AccessNurse Client [facility] Night - Client Client Site [place] - Night Contact Type Call Who Is Calling Patient / Member / Family / Caregiver Caller Name Breelynn Grigg Phone Number [phone] Patient Name Dana Greer Patient DOB [birth year] Call Type Message Only Information Provided Reason for Call Request to [facility] Appointment Initial Comment Caller states they are needing to cancel an appointment 08/12/19. Additional Comment Call Closed By: Fanny Bien Transaction Date/Time: 08/10/2019 7:04:33 PM (ET)

## 2019-08-13 ENCOUNTER — Ambulatory Visit (INDEPENDENT_AMBULATORY_CARE_PROVIDER_SITE_OTHER): Payer: Medicare Other | Admitting: Psychology

## 2019-08-13 DIAGNOSIS — F331 Major depressive disorder, recurrent, moderate: Secondary | ICD-10-CM

## 2019-08-27 ENCOUNTER — Other Ambulatory Visit: Payer: Self-pay | Admitting: Neurology

## 2019-08-27 ENCOUNTER — Telehealth: Payer: Self-pay | Admitting: Family Medicine

## 2019-08-27 ENCOUNTER — Ambulatory Visit (INDEPENDENT_AMBULATORY_CARE_PROVIDER_SITE_OTHER): Payer: Medicare Other | Admitting: Psychology

## 2019-08-27 DIAGNOSIS — F331 Major depressive disorder, recurrent, moderate: Secondary | ICD-10-CM

## 2019-08-27 MED ORDER — TOPIRAMATE 100 MG PO TABS: 100.0000 mg | ORAL_TABLET | Freq: Two times a day (BID) | ORAL | 0 refills | Status: DC

## 2019-08-27 NOTE — Telephone Encounter (Signed)
I refilled it times one She will need to return to prescribing physician for next refill unless her care is changed

## 2019-08-27 NOTE — Telephone Encounter (Signed)
[place] called. They're calling for a refill on Topiramate 100 mg.  One tablet twice a day.  Rx is usually filled by another doctor, but they requested refill request be sent to PCP.

## 2019-09-10 ENCOUNTER — Ambulatory Visit (INDEPENDENT_AMBULATORY_CARE_PROVIDER_SITE_OTHER): Payer: Medicare Other | Admitting: Psychology

## 2019-09-10 DIAGNOSIS — F331 Major depressive disorder, recurrent, moderate: Secondary | ICD-10-CM | POA: Diagnosis not present

## 2019-09-17 DIAGNOSIS — G43709 Chronic migraine without aura, not intractable, without status migrainosus: Secondary | ICD-10-CM | POA: Diagnosis not present

## 2019-09-24 ENCOUNTER — Other Ambulatory Visit: Payer: Self-pay | Admitting: Family Medicine

## 2019-09-24 ENCOUNTER — Ambulatory Visit (INDEPENDENT_AMBULATORY_CARE_PROVIDER_SITE_OTHER): Payer: Medicare Other | Admitting: Psychology

## 2019-09-24 DIAGNOSIS — F331 Major depressive disorder, recurrent, moderate: Secondary | ICD-10-CM

## 2019-09-24 NOTE — Telephone Encounter (Signed)
Pt no-showed her last CPE appt in June and no future appts., last filled on 08/27/19 #60 tab with 0 refills, see last refill request, usually filled through another provider but PCP filled once on 08/27/19 please advise

## 2019-10-03 ENCOUNTER — Encounter: Payer: Self-pay | Admitting: Family Medicine

## 2019-10-04 ENCOUNTER — Ambulatory Visit (INDEPENDENT_AMBULATORY_CARE_PROVIDER_SITE_OTHER): Payer: Medicare Other | Admitting: Family Medicine

## 2019-10-04 ENCOUNTER — Encounter: Payer: Self-pay | Admitting: Family Medicine

## 2019-10-04 DIAGNOSIS — R053 Chronic cough: Secondary | ICD-10-CM

## 2019-10-04 DIAGNOSIS — R05 Cough: Secondary | ICD-10-CM | POA: Diagnosis not present

## 2019-10-04 MED ORDER — FAMOTIDINE 20 MG PO TABS: 20.0000 mg | ORAL_TABLET | Freq: Two times a day (BID) | ORAL | 11 refills | Status: DC

## 2019-10-04 MED ORDER — ALBUTEROL SULFATE HFA 108 (90 BASE) MCG/ACT IN AERS: 2.0000 | INHALATION_SPRAY | RESPIRATORY_TRACT | 5 refills | Status: DC | PRN

## 2019-10-04 MED ORDER — FLUTICASONE PROPIONATE 50 MCG/ACT NA SUSP: 2.0000 | Freq: Every day | NASAL | 11 refills | Status: DC

## 2019-10-04 MED ORDER — PROMETHAZINE-DM 6.25-15 MG/5ML PO SYRP: 5.0000 mL | ORAL_SOLUTION | Freq: Every evening | ORAL | 1 refills | Status: DC | PRN

## 2019-10-04 NOTE — Progress Notes (Signed)
Virtual Visit via Video Note  I connected with Dana Greer on 10/04/19 at 12:00 PM EST by a video enabled telemedicine application and verified that I am speaking with the correct person using two identifiers.  Location: Patient: home Provider: office    I discussed the limitations of evaluation and management by telemedicine and the availability of in person appointments. The patient expressed understanding and agreed to proceed.  Parties involved in encounter  Patient Dana Greer Treating physician  Loura Pardon MD   History of Present Illness: Pt presents with uri symptoms   Has an annoying dry cough  Mainly at night and in the am   It drives her crazy pnd- she takes allergy medicines   No other symptoms  No fever or aches  Cough is dry -no phlegm at all  Gets a ST from coughing   aresol sprays make her worse  So does temp change    No wheezing  No sob  No asthma as a child  No chest tightness   No GERD symptoms lately  She had it a few weeks ago Takes famotidine otc off and up   Allergies  flonase  Generic of claritin  Those take care of it    Given px for benzotate - has to keep taking it   Had used codeine syrup in the past-ins did not cover it   She stays away from spicy food and peppermint    Patient Active Problem List   Diagnosis Date Noted  . Chronic cough 10/04/2019  . Headache, post-traumatic, acute 01/28/2019  . Head injury, closed 01/17/2019  . Post concussive syndrome 01/17/2019  . Chronic migraine 04/12/2018  . B12 deficiency 01/10/2018  . Foot pain, bilateral 01/09/2018  . Hand paresthesia 01/09/2018  . Vitamin D deficiency 01/09/2018  . Chronic myofascial pain 10/26/2016  . HSV-2 infection 08/17/2016  . Adverse effects of medication 11/25/2015  . Vertigo 10/08/2015  . Carpal tunnel syndrome 03/16/2015  . Gout 12/22/2014  . Vertiginous migraine 02/03/2014  . Intractable migraine 08/31/2012  . Osteoarthritis  05/11/2012  . Female bladder prolapse 05/11/2012  . ADJ DISORDER WITH MIXED ANXIETY & DEPRESSED MOOD 06/30/2008  . HYPERHIDROSIS 01/25/2008  . BACK PAIN, LUMBAR, CHRONIC 10/16/2007  . REACTION, ACUTE STRESS W/EMOTIONAL DSTURB 08/01/2007  . Prediabetes 08/01/2007  . Essential hypertension 08/01/2007  . Goiter 07/11/2007  . POLYCYSTIC OVARIAN DISEASE 05/24/2007  . HIRSUTISM 05/24/2007  . Arthropathy, multiple sites 05/24/2007   Past Medical History:  Diagnosis Date  . Arthritis   . Depression   . Fibromyalgia   . Headache(784.0)   . HSV-2 infection   . Hypertension   . Immune deficiency disorder ([place])   . Shingles   . Stroke ([place])   . Thrombocytopenia ([place]) 10/01/2012   Past Surgical History:  Procedure Laterality Date  . ABDOMINAL HYSTERECTOMY    . CARPAL TUNNEL RELEASE Left 09/25/2018   Procedure: LEFT CARPAL TUNNEL RELEASE;  Surgeon: Daryll Brod, MD;  Location: [facility];  Service: Orthopedics;  Laterality: Left;  . CESAREAN SECTION  2008   x1  . PARTIAL HYSTERECTOMY  2012   due to fibroid  . UPPER GASTROINTESTINAL ENDOSCOPY    . WISDOM TOOTH EXTRACTION  1995   Social History   Tobacco Use  . Smoking status: Never Smoker  . Smokeless tobacco: Never Used  . Tobacco comment: Pt states she smokes.....but "not cigarettes"  Substance Use Topics  . Alcohol use: Yes    Comment:  occasional  . Drug use: Yes    Types: Marijuana    Comment: smokes small amount twice daily   Family History  Problem Relation Age of Onset  . Diabetes Father   . COPD Father   . Healthy Mother   . Breast cancer Paternal Aunt   . Colon cancer Paternal Grandmother    Allergies  Allergen Reactions  . Trazodone And Nefazodone Nausea And Vomiting  . Amoxicillin Other (See Comments)    Yeast infection   . Carafate [Sucralfate] Nausea And Vomiting   Current Outpatient Medications on File Prior to Visit  Medication Sig Dispense Refill  . ALPRAZolam (XANAX) 1 MG tablet Take  1 mg by mouth QID.     Marland Kitchen amitriptyline (ELAVIL) 25 MG tablet Take 2 tablets (50 mg total) by mouth daily. 180 tablet 4  . cholecalciferol (VITAMIN D3) 25 MCG (1000 UT) tablet Take 1,000 Units by mouth daily.    . cloNIDine (CATAPRES) 0.2 MG tablet Take 0.2 mg by mouth daily.    . colchicine 0.6 MG tablet Take 1 tablet (0.6 mg total) by mouth 2 (two) times daily. Prn gout 60 tablet 2  . etonogestrel (IMPLANON) 68 MG IMPL implant Inject 1 each into the skin continuous. Reported on 01/27/2016    . gabapentin (NEURONTIN) 300 MG capsule TAKE 1 OR 2 CAPSULES BY MOUTH 3 TIMES A DAY (Patient taking differently: Take 600 mg by mouth 3 (three) times daily. ) 180 capsule 5  . HYDROcodone-acetaminophen (NORCO) 5-325 MG tablet Take 1 tablet by mouth every 6 (six) hours as needed. 20 tablet 0  . loperamide (IMODIUM A-D) 2 MG tablet Take 1 tablet (2 mg total) by mouth 4 (four) times daily as needed for diarrhea or loose stools. 12 tablet 0  . meclizine (ANTIVERT) 25 MG tablet Take 25 mg by mouth 3 (three) times daily as needed for dizziness.    Marland Kitchen MYRBETRIQ 25 MG TB24 tablet TAKE 1 TABLET BY MOUTH ONCE DAILY 30 tablet 5  . ondansetron (ZOFRAN-ODT) 4 MG disintegrating tablet TAKE 1 TABLET BY MOUTH EVERY 8 HOURS AS NEEDED FOR NAUSEA AND VOMITING 30 tablet 3  . PARoxetine (PAXIL) 40 MG tablet Take 1 tablet (40 mg total) by mouth daily. (Patient taking differently: Take 40 mg by mouth 2 (two) times daily. ) 30 tablet 11  . promethazine (PHENERGAN) 25 MG tablet TAKE 1 TABLET BY MOUTH EVERY 8 HOURS AS NEEDED FOR NAUSEA AND VOMITING. 30 tablet 5  . tetrahydrozoline 0.05 % ophthalmic solution Place 1 drop into both eyes daily as needed (for dry eyes).    Marland Kitchen tinidazole (TINDAMAX) 500 MG tablet Take 1,000 mg by mouth daily.    Marland Kitchen topiramate (TOPAMAX) 100 MG tablet Take 1 tablet (100 mg total) by mouth 2 (two) times daily. 60 tablet 2  . valACYclovir (VALTREX) 500 MG tablet TAKE 1 TABLET BY MOUTH ONCE DAILY 30 tablet 5   No  current facility-administered medications on file prior to visit.     Review of Systems  Constitutional: Negative for chills, fever and malaise/fatigue.  HENT: Negative for congestion, ear pain, sinus pain and sore throat.        Pnd/ rhinorrhea  Eyes: Negative for blurred vision, discharge and redness.  Respiratory: Positive for cough. Negative for hemoptysis, sputum production, shortness of breath, wheezing and stridor.   Cardiovascular: Negative for chest pain, palpitations and leg swelling.  Gastrointestinal: Positive for heartburn. Negative for abdominal pain, diarrhea, nausea and vomiting.  Musculoskeletal: Negative for  myalgias.  Skin: Negative for rash.  Neurological: Negative for dizziness and headaches.    Observations/Objective: Patient appears well, in no distress Weight is baseline  No facial swelling or asymmetry Normal voice-not hoarse and no slurred speech No obvious tremor or mobility impairment Moving neck and UEs normally Able to hear the call well  No shortness of breath or wheezing noted    occ dry cough with talking Talkative and mentally sharp with no cognitive changes No skin changes on face or neck , no rash or pallor Affect is normal    Assessment and Plan: Problem List Items Addressed This Visit      Other   Chronic cough    Suspect multifactorial Suspect gerd, allergies, rhinitis and reactive airways may all have a role  No s/s of illness (if so would recommend covid testing)  Px pepcid 20 mg bid to keep taking  prometh dm to try at night  Albuterol mdi to use for tightness or wheeze Avoid allergens Avoid triggers/cold air or aresols  Update if not starting to improve in a week or if worsening   inst to call if worse or new symptoms          Follow Up Instructions: Acid reflux may be worsening your chronic cough Try generic pepcid 20 mg twice daily and keep taking it   Try prometh-DM for cough at bedtime Also albuterol inhaler as needed  for chest tightness or wheeze along with cough   Continue your allergy medicines-flonase and generic claritin   Watch for new symptoms like fever or congestion   Let us know how it goes    I discussed the assessment and treatment plan with the patient. The patient was provided an opportunity to ask questions and all were answered. The patient agreed with the plan and demonstrated an understanding of the instructions.   The patient was advised to call back or seek an in-person evaluation if the symptoms worsen or if the condition fails to improve as anticipated.     Loura Pardon, MD

## 2019-10-04 NOTE — Patient Instructions (Signed)
Acid reflux may be worsening your chronic cough Try generic pepcid 20 mg twice daily and keep taking it   Try prometh-DM for cough at bedtime Also albuterol inhaler as needed for chest tightness or wheeze along with cough   Continue your allergy medicines-flonase and generic claritin   Watch for new symptoms like fever or congestion   Let us know how it goes

## 2019-10-04 NOTE — Assessment & Plan Note (Signed)
Suspect multifactorial Suspect gerd, allergies, rhinitis and reactive airways may all have a role  No s/s of illness (if so would recommend covid testing)  Px pepcid 20 mg bid to keep taking  prometh dm to try at night  Albuterol mdi to use for tightness or wheeze Avoid allergens Avoid triggers/cold air or aresols  Update if not starting to improve in a week or if worsening   inst to call if worse or new symptoms

## 2019-10-08 ENCOUNTER — Ambulatory Visit: Payer: Medicare Other | Admitting: Psychology

## 2019-10-22 ENCOUNTER — Ambulatory Visit (INDEPENDENT_AMBULATORY_CARE_PROVIDER_SITE_OTHER): Payer: Medicare Other | Admitting: Psychology

## 2019-10-22 DIAGNOSIS — F331 Major depressive disorder, recurrent, moderate: Secondary | ICD-10-CM

## 2019-10-24 ENCOUNTER — Encounter: Payer: Self-pay | Admitting: Family Medicine

## 2019-10-24 DIAGNOSIS — R1010 Upper abdominal pain, unspecified: Secondary | ICD-10-CM

## 2019-10-29 ENCOUNTER — Other Ambulatory Visit: Payer: Self-pay

## 2019-10-29 ENCOUNTER — Ambulatory Visit (LOCAL_COMMUNITY_HEALTH_CENTER): Payer: Medicare Other | Admitting: Family Medicine

## 2019-10-29 ENCOUNTER — Encounter: Payer: Self-pay | Admitting: Family Medicine

## 2019-10-29 VITALS — BP 146/97 | Ht 63.0 in | Wt 225.6 lb

## 2019-10-29 DIAGNOSIS — Z3009 Encounter for other general counseling and advice on contraception: Secondary | ICD-10-CM

## 2019-10-29 DIAGNOSIS — Z30017 Encounter for initial prescription of implantable subdermal contraceptive: Secondary | ICD-10-CM | POA: Diagnosis not present

## 2019-10-29 DIAGNOSIS — Z3200 Encounter for pregnancy test, result unknown: Secondary | ICD-10-CM

## 2019-10-29 LAB — PREGNANCY, URINE: Preg Test, Ur: NEGATIVE

## 2019-10-29 MED ORDER — ETONOGESTREL 68 MG ~~LOC~~ IMPL
68.0000 mg | DRUG_IMPLANT | Freq: Once | SUBCUTANEOUS | Status: AC
  Administered 2019-10-29: 68 mg via SUBCUTANEOUS

## 2019-10-29 NOTE — Progress Notes (Signed)
Family Planning Visit- Repeat Yearly Visit  Subjective:  Dana Greer is a 48 y.o. being seen today for an well woman visit and to discuss family planning options.    She is currently using condoms  for pregnancy prevention. Patient reports she does not if she or her partner wants a pregnancy in the next year. Patient  has Goiter; POLYCYSTIC OVARIAN DISEASE; REACTION, ACUTE STRESS W/EMOTIONAL DSTURB; ADJ DISORDER WITH MIXED ANXIETY & DEPRESSED MOOD; HIRSUTISM; Arthropathy, multiple sites; BACK PAIN, LUMBAR, CHRONIC; HYPERHIDROSIS; Prediabetes; Essential hypertension; Osteoarthritis; Female bladder prolapse; Intractable migraine; Vertiginous migraine; Gout; Carpal tunnel syndrome; Vertigo; Adverse effects of medication; HSV-2 infection; Chronic myofascial pain; Upper abdominal pain; Foot pain, bilateral; Hand paresthesia; Vitamin D deficiency; B12 deficiency; Chronic migraine; Head injury, closed; Post concussive syndrome; Headache, post-traumatic, acute; and Chronic cough on their problem list.  Chief Complaint  Patient presents with  . Contraception    pt here for birth control-Nexplanon    Patient reports that she was supposed to have had a hysterectomy (leaving the ovaries intact) in 2015 due to a fibroid/endometriosis history.  Client states that she began having periods after surgery and discussed this with her surgeon.  Per client he told her that is was blood left since her surgery.  Client states that she continues to have menses.  She has had second opinions who have confirmed that she does have a uterus and ovaries. Client states that she had a "dye test" for her fallopian tubes to check for patency  She states that they were normal.  She receives her care @ [facility] in [place], her PCP is Dr Dana Greer.  She has had a annual exam and pap within the last 6 months.  No abnormal pap reported from this visit.  She has multiple health concerns and chronic health conditions  that are managed by Dr. Corinna Greer Client used a Nexplanon x 3 yrs and had it removed recently by PCP.  Client states that Medicare will not pay for birth control so she came to [facility] for the method.  Client always uses condoms  for birth control.    Does the patient desire a pregnancy in the next year? (OKQ flowsheet)NO  See flowsheet for other program required questions.   Body mass index is 39.96 kg/m. - Patient is eligible for diabetes screening based on BMI and age >56?  yes HA1C ordered? No.  Client is being managed by Dr. Corinna Greer  Patient reports 1 of partners in last year. Desires STI screening?  No.  Does the patient have a current or past history of drug use? No   No components found for: HCV]   Health Maintenance Due  Topic Date Due  . INFLUENZA VACCINE  06/01/2019    ROS  The following portions of the patient's history were reviewed and updated as appropriate: allergies, current medications, past family history, past medical history, past social history, past surgical history and problem list. Problem list updated.  Objective:   Vitals:   10/29/19 0921  BP: (!) 146/97  Weight: 225 lb 9.6 oz (102.3 kg)  Height: 5\' 3"  (1.6 m)    Physical Exam Constitutional:      Appearance: She is obese.  Abdominal:     Hernia: There is no hernia in the left inguinal area or right inguinal area.  Genitourinary:    General: Normal vulva.     Pubic Area: No rash or pubic lice.      Labia:  Right: No rash, tenderness, lesion or injury.        Left: No rash, tenderness, lesion or injury.      Urethra: No urethral lesion.     Vagina: Normal. No vaginal discharge.     Cervix: Normal.     Uterus: Normal.      Adnexa:        Right: No mass, tenderness or fullness.         Left: No mass, tenderness or fullness.       Rectum: Normal.  Lymphadenopathy:     Lower Body: No right inguinal adenopathy. No left inguinal adenopathy.  Skin:    General: Skin is warm and dry.     Findings:  No lesion or rash.  Neurological:     Mental Status: She is alert.    Assessment and Plan:  Dana Greer is a 49 y.o. female presenting to the [facility] Department for an initial well woman exam/family planning visit  Contraception counseling: Reviewed all forms of birth control options in the tiered based approach. available including abstinence; over the counter/barrier methods; hormonal contraceptive medication including pill, patch, ring, injection,contraceptive implant; hormonal and nonhormonal IUDs; permanent sterilization options including vasectomy and the various tubal sterilization modalities. Risks, benefits, and typical effectiveness rates were reviewed.  Questions were answered.  Written information was also given to the patient to review.  Patient desires Nexplanon, this was prescribed for patient. She will follow up in 12 year for surveillance.  She was told to call with any further questions, or with any concerns about this method of contraception.  Emphasized use of condoms 100% of the time for STI prevention.  ECP counseling was not given - see RN documentation   Patient desires Nexplanon, this was prescribed for patient. She will follow up in I year for surveillance.  Patient was counseled on the side effect of the method chosen, the correct use of her desired method and how to discontinue in the future. She was told to call with any further questions, or with any concerns about this method of contraception.  Emphasized use of condoms 100% of the time for STI prevention.  1. Possible pregnancy  - Pregnancy, urine- negative  2. General counseling and advice on contraceptive management Co client that the exam reveals she does have a uterus and cervix.  She denies that she has had a BTL  She is a candidate for the Nexplanon.  Co that it would be helpful for her to request her records for the results of the fallopian tubes patency for her PCP provider. Client  left before BP recheck. 3. Nexplanon insertion - etonogestrel (NEXPLANON) implant 68 mg Nexplanon Insertion Procedure Patient identified, informed consent performed, consent signed.   Patient does understand that irregular bleeding is a very common side effect of this medication. She was advised to have backup contraception after placement. Patient was determined to meet WHO criteria for not being pregnant. Appropriate time out taken.  The insertion site was identified 8-10 cm (3-4 inches) from the medial epicondyle of the humerus and 3-5 cm (1.25-2 inches) posterior to (below) the sulcus (groove) between the biceps and triceps muscles of the patient's Left arm and marked.  Patient was prepped with alcohol swab and then injected with 3 ml of 1% lidocaine.  Arm was prepped with chlorhexidene, Nexplanon removed from packaging,  Device confirmed in needle, then inserted full length of needle and withdrawn per handbook instructions. Nexplanon was able to  palpated in the patient's arm; patient palpated the insert herself. There was minimal blood loss.  Patient insertion site covered with guaze and a pressure bandage to reduce any bruising.  The patient tolerated the procedure well and was given post procedure instructions.   No follow-ups on file.  Future Appointments  Date Time Provider [place]  11/05/2019  4:00 PM Cottle, Lucious Groves, LCSW [facility] None  11/19/2019  4:00 PM Cottle, Bambi G, LCSW [facility] None    Hassell Done, FNP

## 2019-10-29 NOTE — Progress Notes (Signed)
Consents for Nexplanon insertion reviewed and signed by pt and RN counseling for Nexplanon insertion completed per Hassell Done, FNP verbal order.Ronny Bacon, RN

## 2019-10-29 NOTE — Progress Notes (Signed)
Pt here for Nexplanon. Pt reports she had a partial hysterectomy at [facility] 2013. Pt unsure if she is still able to have children. Pt reports that she still has half of her uterus and ovaries. BP is elevated today, pt reports she took her BP medication this morning and Hassell Done, FNP made aware.Ronny Bacon, RN

## 2019-10-29 NOTE — Progress Notes (Signed)
UPT is negative today. ROI for last physical exam and pap result obtained and signed by pt per Hassell Done, FNP verbal order. ROI faxed and fax confirmation received. Provider orders completed.Ronny Bacon, RN

## 2019-11-05 ENCOUNTER — Ambulatory Visit: Payer: Medicare Other | Admitting: Psychology

## 2019-11-19 ENCOUNTER — Ambulatory Visit (INDEPENDENT_AMBULATORY_CARE_PROVIDER_SITE_OTHER): Payer: Medicare Other | Admitting: Psychology

## 2019-11-19 DIAGNOSIS — F331 Major depressive disorder, recurrent, moderate: Secondary | ICD-10-CM

## 2019-11-20 ENCOUNTER — Other Ambulatory Visit: Payer: Self-pay | Admitting: Family Medicine

## 2019-11-26 DIAGNOSIS — Z1151 Encounter for screening for human papillomavirus (HPV): Secondary | ICD-10-CM | POA: Diagnosis not present

## 2019-11-26 DIAGNOSIS — Z01419 Encounter for gynecological examination (general) (routine) without abnormal findings: Secondary | ICD-10-CM | POA: Diagnosis not present

## 2019-11-28 ENCOUNTER — Other Ambulatory Visit: Payer: Self-pay | Admitting: Family Medicine

## 2019-11-30 ENCOUNTER — Encounter: Payer: Self-pay | Admitting: Physician Assistant

## 2019-11-30 LAB — HM PAP SMEAR

## 2019-12-03 ENCOUNTER — Ambulatory Visit: Payer: Medicare Other | Admitting: Psychology

## 2019-12-17 ENCOUNTER — Ambulatory Visit (INDEPENDENT_AMBULATORY_CARE_PROVIDER_SITE_OTHER): Payer: Medicare Other | Admitting: Psychology

## 2019-12-17 DIAGNOSIS — F331 Major depressive disorder, recurrent, moderate: Secondary | ICD-10-CM

## 2019-12-27 ENCOUNTER — Encounter: Payer: Self-pay | Admitting: Family Medicine

## 2019-12-27 DIAGNOSIS — R1010 Upper abdominal pain, unspecified: Secondary | ICD-10-CM

## 2019-12-29 NOTE — Telephone Encounter (Signed)
Please call her to set up a lab appt

## 2019-12-30 ENCOUNTER — Other Ambulatory Visit (INDEPENDENT_AMBULATORY_CARE_PROVIDER_SITE_OTHER): Payer: Medicare Other

## 2019-12-30 DIAGNOSIS — R1010 Upper abdominal pain, unspecified: Secondary | ICD-10-CM | POA: Diagnosis not present

## 2019-12-30 LAB — BASIC METABOLIC PANEL
BUN: 11 mg/dL (ref 6–23)
CO2: 26 mEq/L (ref 19–32)
Calcium: 9.2 mg/dL (ref 8.4–10.5)
Chloride: 111 mEq/L (ref 96–112)
Creatinine, Ser: 1.07 mg/dL (ref 0.40–1.20)
GFR: 65.87 mL/min (ref >60.00)
Glucose, Bld: 98 mg/dL (ref 70–99)
Potassium: 4.1 mEq/L (ref 3.5–5.1)
Sodium: 141 mEq/L (ref 135–145)

## 2019-12-30 LAB — CBC WITH DIFFERENTIAL/PLATELET
Basophils Absolute: 0 10*3/uL (ref 0.0–0.1)
Basophils Relative: 0.4 % (ref 0.0–3.0)
Eosinophils Absolute: 0.1 10*3/uL (ref 0.0–0.7)
Eosinophils Relative: 1.8 % (ref 0.0–5.0)
HCT: 38.7 % (ref 36.0–46.0)
Hemoglobin: 13 g/dL (ref 12.0–15.0)
Lymphocytes Relative: 35.2 % (ref 12.0–46.0)
Lymphs Abs: 2.1 10*3/uL (ref 0.7–4.0)
MCHC: 33.6 g/dL (ref 30.0–36.0)
MCV: 100 fl (ref 78.0–100.0)
Monocytes Absolute: 0.3 10*3/uL (ref 0.1–1.0)
Monocytes Relative: 5 % (ref 3.0–12.0)
Neutro Abs: 3.4 10*3/uL (ref 1.4–7.7)
Neutrophils Relative %: 57.6 % (ref 43.0–77.0)
Platelets: 162 10*3/uL (ref 150.0–400.0)
RBC: 3.87 Mil/uL (ref 3.87–5.11)
RDW: 14.6 % (ref 11.5–15.5)
WBC: 5.9 10*3/uL (ref 4.0–10.5)

## 2019-12-30 LAB — HEPATIC FUNCTION PANEL
ALT: 9 U/L (ref 0–35)
AST: 12 U/L (ref 0–37)
Albumin: 3.8 g/dL (ref 3.5–5.2)
Alkaline Phosphatase: 39 U/L (ref 39–117)
Bilirubin, Direct: 0.1 mg/dL (ref 0.0–0.3)
Total Bilirubin: 0.4 mg/dL (ref 0.2–1.2)
Total Protein: 6.9 g/dL (ref 6.0–8.3)

## 2019-12-30 LAB — LIPASE: Lipase: 28 U/L (ref 11.0–59.0)

## 2020-01-01 ENCOUNTER — Encounter: Payer: Self-pay | Admitting: Family Medicine

## 2020-01-01 DIAGNOSIS — R1010 Upper abdominal pain, unspecified: Secondary | ICD-10-CM

## 2020-01-01 NOTE — Telephone Encounter (Signed)
I placed a referral  Will route to [facility]

## 2020-01-08 ENCOUNTER — Encounter: Payer: Self-pay | Admitting: Nurse Practitioner

## 2020-01-08 ENCOUNTER — Ambulatory Visit (INDEPENDENT_AMBULATORY_CARE_PROVIDER_SITE_OTHER): Payer: Medicare Other | Admitting: Nurse Practitioner

## 2020-01-08 VITALS — BP 144/98 | HR 86 | Temp 97.8°F | Ht 62.0 in | Wt 209.0 lb

## 2020-01-08 DIAGNOSIS — R109 Unspecified abdominal pain: Secondary | ICD-10-CM

## 2020-01-08 DIAGNOSIS — Z1211 Encounter for screening for malignant neoplasm of colon: Secondary | ICD-10-CM | POA: Diagnosis not present

## 2020-01-08 DIAGNOSIS — M546 Pain in thoracic spine: Secondary | ICD-10-CM

## 2020-01-08 DIAGNOSIS — R1012 Left upper quadrant pain: Secondary | ICD-10-CM

## 2020-01-08 DIAGNOSIS — Z01818 Encounter for other preprocedural examination: Secondary | ICD-10-CM

## 2020-01-08 DIAGNOSIS — K219 Gastro-esophageal reflux disease without esophagitis: Secondary | ICD-10-CM

## 2020-01-08 MED ORDER — PANTOPRAZOLE SODIUM 40 MG PO TBEC: 40.0000 mg | DELAYED_RELEASE_TABLET | Freq: Every day | ORAL | 1 refills | Status: DC

## 2020-01-08 NOTE — Patient Instructions (Addendum)
If you are age 50 or older, your body mass index should be between 23-30. Your Body mass index is 38.23 kg/m. If this is out of the aforementioned range listed, please consider follow up with your Primary Care Provider.  If you are age 74 or younger, your body mass index should be between 19-25. Your Body mass index is 38.23 kg/m. If this is out of the aformentioned range listed, please consider follow up with your Primary Care Provider.   We have sent the following medications to your pharmacy for you to pick up at your convenience:  Pantoprazole 40 mg  Continue using your famatodine 20 mg  Follow up with your PCP for hypertension and back pain  Your CT has been scheduled at [facility] if you need to change this appointment time please call [phone]  You are scheduled on 01/17/2020 at 2:30 pm. You should arrive 15 minutes prior to your appointment time for registration. Please follow the written instructions below on the day of your exam:  WARNING: IF YOU ARE ALLERGIC TO IODINE/X-RAY DYE, PLEASE NOTIFY RADIOLOGY IMMEDIATELY AT [phone]! YOU WILL BE GIVEN A 13 HOUR PREMEDICATION PREP.  1) Do not eat or drink anything after 10:30 am (4 hours prior to your test) 2) You have been given 2 bottles of oral contrast to drink. The solution may taste better if refrigerated, but do NOT add ice or any other liquid to this solution. Shake well before drinking.    Drink 1 bottle of contrast @ 12:30 pm (2 hours prior to your exam)  Drink 1 bottle of contrast @ 1:30 pm (1 hour prior to your exam)  You may take any medications as prescribed with a small amount of water, if necessary. If you take any of the following medications: METFORMIN, GLUCOPHAGE, GLUCOVANCE, AVANDAMET, RIOMET, FORTAMET, [place] MET, JANUMET, GLUMETZA or METAGLIP, you MAY be asked to HOLD this medication 48 hours AFTER the exam.  The purpose of you drinking the oral contrast is to aid in the visualization of your intestinal  tract. The contrast solution may cause some diarrhea. Depending on your individual set of symptoms, you may also receive an intravenous injection of x-ray contrast/dye. Plan on being at [facility] for 30 minutes or longer, depending on the type of exam you are having performed.  This test typically takes 30-45 minutes to complete.  If you have any questions regarding your exam or if you need to reschedule, you may call the CT department at [phone] between the hours of 8:00 am and 5:00 pm, Monday-Friday.  ________________________________________________________________________     Due to recent changes in healthcare laws, you may see the results of your imaging and laboratory studies on MyChart before your provider has had a chance to review them.  We understand that in some cases there may be results that are confusing or concerning to you. Not all laboratory results come back in the same time frame and the provider may be waiting for multiple results in order to interpret others.  Please give [place] 48 hours in order for your provider to thoroughly review all the results before contacting the office for clarification of your results.   Thank you for choosing [facility] Noralyn Pick, CRNP

## 2020-01-08 NOTE — Progress Notes (Signed)
01/08/2020 Dnaja Sleppy [id number] [birth year]   CHIEF COMPLAINT: Upper abdominal pain   HISTORY OF PRESENT ILLNESS:  Marivi Durrence is a 49 year old female with a past medical history of anxiety, depression, fibromyalgia, hypertension, questionable stroke approximately 10 years ago and lower back pain. Past partial hysterectomy and left carpal tunnel surgery. She presents today for further evaluation regarding LUQ pain which radiates to the flank and left upper back area. She cannot sit of stand for a long period of time. Less pain if she lays on her left side. If she stretches from side to side or bends down with hands stretched out touching her toes he pain is relieved. Her left abd/side pain has occurred intermittently since 2012. Her pain can last for 1 or 2 days then goes away for one week at a time. Eating does not trigger her pain. No improvement after defecation. She passes a brown soft stool 3 to 7 times daily. No black stools or rectal bleeding.  No rectal bleeding or black stools. No dysuria or hematuria. She sometimes uses a heated blanket with relief. She has daily heartburn for "years". No dysphagia. She takes Pepcid 20mg  po bid. She has sporadic episodes when she awakens with sweats and she abruptly vomits with dry heaves for several hours which occurs once or twice monthly for a period of time and sometimes occurs only once every 6 months for the past 10 years. She last vomited at night in mid Feb. 2021, she noted small clots of blood mixed in mucous type emesis. No NSAID use. No cough/SOB or hemoptysis. She underwent an EGD in 2016 which showed gastritis. She never had a colonoscopy. Paternal grandmother with history of colon cancer diagnosed in her 44's or 83's. No other complaints today.    CBC Latest Ref Rng & Units 12/30/2019 04/10/2019 11/23/2018  WBC 4.0 - 10.5 K/uL 5.9 5.9 11.2(H)  Hemoglobin 12.0 - 15.0 g/dL 13.0 14.1 14.7  Hematocrit 36.0 - 46.0 % 38.7 41.9  43.8  Platelets 150.0 - 400.0 K/uL 162.0 163.0 166   CMP Latest Ref Rng & Units 12/30/2019 04/10/2019 11/23/2018  Glucose 70 - 99 mg/dL 98 127(H) 148(H)  BUN 6 - 23 mg/dL 11 14 8   Creatinine 0.40 - 1.20 mg/dL 1.07 1.11 0.88  Sodium 135 - 145 mEq/L 141 140 136  Potassium 3.5 - 5.1 mEq/L 4.1 4.1 3.0(L)  Chloride 96 - 112 mEq/L 111 110 103  CO2 19 - 32 mEq/L 26 23 23   Calcium 8.4 - 10.5 mg/dL 9.2 9.2 9.3  Total Protein 6.0 - 8.3 g/dL 6.9 7.0 8.7(H)  Total Bilirubin 0.2 - 1.2 mg/dL 0.4 0.6 0.7  Alkaline Phos 39 - 117 U/L 39 49 57  AST 0 - 37 U/L 12 13 26   ALT 0 - 35 U/L 9 10 18   Lipase 28.   EGD 05/15/2015:  1) Mild antral gastritis - red spots and mottled mucosa - biopsied. 2) Otherwise normal EGD  Past Medical History:  Diagnosis Date  . Arthritis   . Depression   . Fibromyalgia   . Headache(784.0)   . HSV-2 infection   . Hypertension   . Immune deficiency disorder ([place])   . Shingles   . Stroke ([place])   . Thrombocytopenia ([place]) 10/01/2012   Past Surgical History:  Procedure Laterality Date  . ABDOMINAL HYSTERECTOMY    . CARPAL TUNNEL RELEASE Left 09/25/2018   Procedure: LEFT CARPAL TUNNEL RELEASE;  Surgeon: Daryll Brod,  MD;  Location: [facility];  Service: Orthopedics;  Laterality: Left;  . CESAREAN SECTION  2008   x1  . PARTIAL HYSTERECTOMY  2012   due to fibroid  . UPPER GASTROINTESTINAL ENDOSCOPY    . [place]     reports that she has never smoked. She has never used smokeless tobacco. She reports current alcohol use. She reports current drug use. Drug: Marijuana. She is separated.  family history includes Breast cancer in her paternal aunt; COPD in her father; Colon cancer in her paternal grandmother; Diabetes in her father; Healthy in her mother. Allergies  Allergen Reactions  . Trazodone And Nefazodone Nausea And Vomiting  . Amoxicillin Other (See Comments)    Yeast infection   . Carafate [Sucralfate] Nausea And Vomiting        Outpatient Encounter Medications as of 01/08/2020  Medication Sig  . albuterol (VENTOLIN HFA) 108 (90 Base) MCG/ACT inhaler Inhale 2 puffs into the lungs every 4 (four) hours as needed (cough or tight feeling chest or wheezing).  . ALPRAZolam (XANAX) 1 MG tablet Take 1 mg by mouth QID.   Marland Kitchen amitriptyline (ELAVIL) 25 MG tablet Take 2 tablets (50 mg total) by mouth daily.  . cholecalciferol (VITAMIN D3) 25 MCG (1000 UT) tablet Take 1,000 Units by mouth daily.  . cloNIDine (CATAPRES) 0.2 MG tablet Take 0.2 mg by mouth daily.  . colchicine 0.6 MG tablet Take 1 tablet (0.6 mg total) by mouth 2 (two) times daily. Prn gout  . etonogestrel (IMPLANON) 68 MG IMPL implant Inject 1 each into the skin continuous. Reported on 01/27/2016  . famotidine (PEPCID) 20 MG tablet Take 1 tablet (20 mg total) by mouth 2 (two) times daily.  . fluticasone (FLONASE) 50 MCG/ACT nasal spray Place 2 sprays into both nostrils daily.  Marland Kitchen gabapentin (NEURONTIN) 300 MG capsule TAKE 1 OR 2 CAPSULES BY MOUTH 3 TIMES A DAY (Patient taking differently: Take 600 mg by mouth 3 (three) times daily. )  . HYDROcodone-acetaminophen (NORCO) 5-325 MG tablet Take 1 tablet by mouth every 6 (six) hours as needed.  . meclizine (ANTIVERT) 25 MG tablet Take 25 mg by mouth 3 (three) times daily as needed for dizziness.  Marland Kitchen MYRBETRIQ 25 MG TB24 tablet TAKE 1 TABLET BY MOUTH ONCE A DAY  . ondansetron (ZOFRAN-ODT) 4 MG disintegrating tablet TAKE 1 TABLET BY MOUTH EVERY 8 HOURS AS NEEDED FOR NAUSEA AND VOMITING  . PARoxetine (PAXIL) 40 MG tablet Take 1 tablet (40 mg total) by mouth daily. (Patient taking differently: Take 40 mg by mouth 2 (two) times daily. )  . promethazine (PHENERGAN) 25 MG tablet TAKE 1 TABLET BY MOUTH EVERY 8 HOURS AS NEEDED FOR NAUSEA AND VOMITING.  . promethazine-dextromethorphan (PROMETHAZINE-DM) 6.25-15 MG/5ML syrup Take 5 mLs by mouth at bedtime as needed for cough. Caution of sedation  . tetrahydrozoline 0.05 % ophthalmic  solution Place 1 drop into both eyes daily as needed (for dry eyes).  Marland Kitchen tinidazole (TINDAMAX) 500 MG tablet Take 1,000 mg by mouth daily.  Marland Kitchen topiramate (TOPAMAX) 100 MG tablet Take 1 tablet (100 mg total) by mouth 2 (two) times daily.  . valACYclovir (VALTREX) 500 MG tablet TAKE 1 TABLET BY MOUTH ONCE DAILY  . [DISCONTINUED] loperamide (IMODIUM A-D) 2 MG tablet Take 1 tablet (2 mg total) by mouth 4 (four) times daily as needed for diarrhea or loose stools.   No facility-administered encounter medications on file as of 01/08/2020.     REVIEW  OF SYSTEMS: All other systems reviewed and negative except where noted in the History of Present Illness.   PHYSICAL EXAM: Temp 97.8 F (36.6 C)   Ht 5\' 2"  (1.575 m)   Wt 209 lb (94.8 kg)   BMI 38.23 kg/m  General: Well developed 49 year old female in no acute distress. Head: Normocephalic and atraumatic. Eyes:  Sclerae non-icteric, conjunctive pink. Ears: Normal auditory acuity. Mouth: Dentition intact. No ulcers or lesions.  Neck: Supple, no lymphadenopathy or thyromegaly.  Lungs: Clear bilaterally to auscultation without wheezes, crackles or rhonchi. Heart: Regular rate and rhythm. No murmur, rub or gallop appreciated.  Abdomen: Soft, nondistended. Moderate LUQ tenderness radiates to left flank area. No CVA tenderness. No masses. No hepatosplenomegaly. Normoactive bowel sounds x 4 quadrants.  Rectal: Deferred.  Musculoskeletal: Scoliosis, right posterior thorax bulges Skin: Warm and dry. No rash or lesions on visible extremities. Extremities: No edema. Neurological: Alert oriented x 4, no focal deficits.  Psychological:  Alert and cooperative. Normal mood and affect.  ASSESSMENT AND PLAN:  19. 49 year old female with chronic recurring LUQ/left flank pain. Normal LFTs and lipase.  -Abd/pelvic CT with oral and IV contrast  -EGD benefits and risks discussed including risk with sedation, risk of bleeding, perforation and infection   -Pantoprazole 40mg  in the am. Famotidine 20mg  one tab at bed time.  -Patient to call our office if her symptoms worsen  2. GERD, episodic vomiting, last episode with hematemesis -See plan in # 1 -Zofran PRN  3. Screening colonoscopy. Family history of colon cancer.  - Colonoscopy benefits and risks discussed including risk with sedation, risk of bleeding, perforation and infection   4. Scoliosis,  back pain -Follow up with PCP for management   5. HTN  -Follow up with PCP for management    CC:  [name], Wynelle Fanny, MD

## 2020-01-14 ENCOUNTER — Ambulatory Visit: Payer: Medicare Other | Admitting: Psychology

## 2020-01-17 ENCOUNTER — Encounter: Payer: Self-pay | Admitting: Family Medicine

## 2020-01-17 ENCOUNTER — Other Ambulatory Visit: Payer: Self-pay

## 2020-01-17 ENCOUNTER — Ambulatory Visit (HOSPITAL_COMMUNITY)
Admission: RE | Admit: 2020-01-17 | Discharge: 2020-01-17 | Disposition: A | Discharge status: Home / Self Care | Payer: Medicare Other | Source: Ambulatory Visit | Attending: Nurse Practitioner | Admitting: Nurse Practitioner

## 2020-01-17 DIAGNOSIS — R1012 Left upper quadrant pain: Secondary | ICD-10-CM | POA: Diagnosis not present

## 2020-01-17 DIAGNOSIS — Z1211 Encounter for screening for malignant neoplasm of colon: Secondary | ICD-10-CM | POA: Diagnosis not present

## 2020-01-17 DIAGNOSIS — K219 Gastro-esophageal reflux disease without esophagitis: Secondary | ICD-10-CM | POA: Diagnosis not present

## 2020-01-17 DIAGNOSIS — R109 Unspecified abdominal pain: Secondary | ICD-10-CM | POA: Insufficient documentation

## 2020-01-17 DIAGNOSIS — K429 Umbilical hernia without obstruction or gangrene: Secondary | ICD-10-CM | POA: Diagnosis not present

## 2020-01-17 DIAGNOSIS — M546 Pain in thoracic spine: Secondary | ICD-10-CM | POA: Insufficient documentation

## 2020-01-17 MED ORDER — SODIUM CHLORIDE (PF) 0.9 % IJ SOLN
INTRAMUSCULAR | Status: AC
  Filled 2020-01-17: qty 50

## 2020-01-17 MED ORDER — IOHEXOL 300 MG/ML  SOLN
100.0000 mL | Freq: Once | INTRAMUSCULAR | Status: AC | PRN
  Administered 2020-01-17: 100 mL via INTRAVENOUS

## 2020-01-17 NOTE — Telephone Encounter (Signed)
Dr Glori Bickers please review. Is it ok to schedule patient in office to address symptoms?

## 2020-01-20 NOTE — Telephone Encounter (Signed)
Per Dr. Glori Bickers,    She needs in office visit for BP  She has a chronic cough (not new or infectious) -can be seen in person unless she develops fever or other symptoms    Message text    Pt scheduled for apt for tomorrow.

## 2020-01-21 ENCOUNTER — Encounter: Payer: Self-pay | Admitting: Family Medicine

## 2020-01-21 ENCOUNTER — Other Ambulatory Visit: Payer: Self-pay

## 2020-01-21 ENCOUNTER — Ambulatory Visit (INDEPENDENT_AMBULATORY_CARE_PROVIDER_SITE_OTHER): Payer: Medicare Other | Admitting: Family Medicine

## 2020-01-21 ENCOUNTER — Ambulatory Visit (INDEPENDENT_AMBULATORY_CARE_PROVIDER_SITE_OTHER)
Admission: RE | Admit: 2020-01-21 | Discharge: 2020-01-21 | Disposition: A | Discharge status: Home / Self Care | Payer: Medicare Other | Source: Ambulatory Visit | Attending: Family Medicine | Admitting: Family Medicine

## 2020-01-21 VITALS — BP 138/90 | HR 88 | Temp 98.2°F | Ht 62.0 in | Wt 214.7 lb

## 2020-01-21 DIAGNOSIS — I10 Essential (primary) hypertension: Secondary | ICD-10-CM | POA: Diagnosis not present

## 2020-01-21 DIAGNOSIS — R053 Chronic cough: Secondary | ICD-10-CM

## 2020-01-21 DIAGNOSIS — J301 Allergic rhinitis due to pollen: Secondary | ICD-10-CM

## 2020-01-21 DIAGNOSIS — J302 Other seasonal allergic rhinitis: Secondary | ICD-10-CM | POA: Insufficient documentation

## 2020-01-21 DIAGNOSIS — R05 Cough: Secondary | ICD-10-CM | POA: Diagnosis not present

## 2020-01-21 MED ORDER — BENZONATATE 200 MG PO CAPS: 200.0000 mg | ORAL_CAPSULE | Freq: Three times a day (TID) | ORAL | 3 refills | Status: DC | PRN

## 2020-01-21 MED ORDER — AMLODIPINE BESYLATE 5 MG PO TABS: 5.0000 mg | ORAL_TABLET | Freq: Every day | ORAL | 5 refills | Status: DC

## 2020-01-21 NOTE — Assessment & Plan Note (Signed)
This may add to chronic cough  No improvement with antihistamine so far

## 2020-01-21 NOTE — Patient Instructions (Addendum)
For weight loss and better health  Try to get most of your carbohydrates from produce (with the exception of white potatoes)  Eat less bread/pasta/rice/snack foods/cereals/sweets and other items from the middle of the grocery store (processed carbs)    Start amlodipine 5 mg each am  If any problems or side effects let me know   Follow up in 4-6 weeks   Try tessalon for cough  Chest xray today  When I get a result will make more of a plan

## 2020-01-21 NOTE — Assessment & Plan Note (Signed)
No improvement with tx of allergic rhinitis and GERD Px tessalon for cough relief cxr today  May have reactive airways as triggers include cold air and drinks and albuterol helps May benefit from eventual spirometry or PFTs (after pandemic)  Consider inhaled corticosteroid

## 2020-01-21 NOTE — Assessment & Plan Note (Signed)
Pt already takes clonodine for sleep qhs bp is running near 140/90  Disc lifestyle change-given handouts on DASH eating and HTN  Plans to work on wt loss  Wants to avoid diuretic and also has chronic cough so want to avoid ace Will try amlodipine 5 mg daily -disc poss side eff

## 2020-01-21 NOTE — Progress Notes (Signed)
Subjective:    Patient ID: Dana Greer, female    DOB: [birth year], 49 y.o.   MRN: [id number]  This visit occurred during the SARS-CoV-2 public health emergency.  Safety protocols were in place, including screening questions prior to the visit, additional usage of staff PPE, and extensive cleaning of exam room while observing appropriate contact time as indicated for disinfecting solutions.    HPI  Pt presents for problems with blood pressure  Also chronic cough (not sick)  Wt Readings from Last 3 Encounters:  01/21/20 214 lb 11.2 oz (97.4 kg)  01/08/20 209 lb (94.8 kg)  10/29/19 225 lb 9.6 oz (102.3 kg)  wt gain noted  39.27 kg/m   Working on lifestyle habits  For exercise she walks and does yoga  (trying to get mother to go with her since they live together)   She eats a lot fruit     Hypertension  BP Readings from Last 3 Encounters:  01/21/20 138/90  01/08/20 (!) 144/98  10/29/19 (!) 146/97   Clonidine 0.2 mg daily (for sleep from her psychiatrist)  Took it this am at 4:30   Family hx of HTN -mother   Working on better diet- cutting back on fried chicken and sodas  Getting botox for her headaches    It has been elevated at other offices     Pulse Readings from Last 3 Encounters:  01/21/20 88  01/08/20 86  01/17/19 (!) 59    In the past suspected chronic cough was multifactorial  Disc gerd, allergies, rhinitis and possible reactive airways  Have tx with  pepcid 20 mg bid-- did not help cough  prometh dm for night time cough- (puts her to sleep -thinks it helps)  Albuterol mri prn  Avoidance of triggers   She also takes gabapentin   She finds herself coughing more at night -wakes her up sometimes  Recently traveled to [place] -cold air made her cough and have trouble breathing  (also happens when she drinks cold things)  She does wheeze occ (inhaler)  Does have allergy issues -takes claritin    Patient Active Problem List   Diagnosis  Date Noted  . Seasonal allergic rhinitis 01/21/2020  . Chronic cough 10/04/2019  . Headache, post-traumatic, acute 01/28/2019  . Head injury, closed 01/17/2019  . Post concussive syndrome 01/17/2019  . Abnormal vaginal bleeding 07/06/2018  . Neck pain 06/25/2018  . Chronic migraine 04/12/2018  . B12 deficiency 01/10/2018  . Foot pain, bilateral 01/09/2018  . Hand paresthesia 01/09/2018  . Vitamin D deficiency 01/09/2018  . Upper abdominal pain 11/22/2016  . Chronic myofascial pain 10/26/2016  . HSV-2 infection 08/17/2016  . Adverse effects of medication 11/25/2015  . Vertigo 10/08/2015  . Carpal tunnel syndrome 03/16/2015  . Gout 12/22/2014  . Vertiginous migraine 02/03/2014  . Intractable migraine 08/31/2012  . Osteoarthritis 05/11/2012  . Female bladder prolapse 05/11/2012  . ADJ DISORDER WITH MIXED ANXIETY & DEPRESSED MOOD 06/30/2008  . HYPERHIDROSIS 01/25/2008  . BACK PAIN, LUMBAR, CHRONIC 10/16/2007  . REACTION, ACUTE STRESS W/EMOTIONAL DSTURB 08/01/2007  . Prediabetes 08/01/2007  . Essential hypertension 08/01/2007  . Goiter 07/11/2007  . POLYCYSTIC OVARIAN DISEASE 05/24/2007  . HIRSUTISM 05/24/2007  . Arthropathy, multiple sites 05/24/2007   Past Medical History:  Diagnosis Date  . Arthritis   . Depression   . Fibromyalgia   . Headache(784.0)   . Hypertension   . Immune deficiency disorder ([place])   . Migraine headache   .  Shingles   . Stroke ([place])   . Thrombocytopenia ([place]) 10/01/2012   Past Surgical History:  Procedure Laterality Date  . ABDOMINAL HYSTERECTOMY    . CARPAL TUNNEL RELEASE Left 09/25/2018   Procedure: LEFT CARPAL TUNNEL RELEASE;  Surgeon: Daryll Brod, MD;  Location: [facility];  Service: Orthopedics;  Laterality: Left;  . CESAREAN SECTION  2008   x1  . PARTIAL HYSTERECTOMY  2012   due to fibroid  . UPPER GASTROINTESTINAL ENDOSCOPY    . WISDOM TOOTH EXTRACTION  1995   Social History   Tobacco Use  . Smoking status: Never  Smoker  . Smokeless tobacco: Never Used  Substance Use Topics  . Alcohol use: Yes    Comment: occasional  . Drug use: Yes    Types: Marijuana    Comment: smokes small amount twice daily   Family History  Problem Relation Age of Onset  . Diabetes Father   . COPD Father   . Healthy Mother   . Breast cancer Paternal Aunt   . Colon cancer Paternal Grandmother   . Lung cancer Paternal Uncle   . Lung cancer Paternal Aunt    Allergies  Allergen Reactions  . Trazodone And Nefazodone Nausea And Vomiting  . Amoxicillin Other (See Comments)    Yeast infection   . Carafate [Sucralfate] Nausea And Vomiting   Current Outpatient Medications on File Prior to Visit  Medication Sig Dispense Refill  . albuterol (VENTOLIN HFA) 108 (90 Base) MCG/ACT inhaler Inhale 2 puffs into the lungs every 4 (four) hours as needed (cough or tight feeling chest or wheezing). 18 g 5  . ALPRAZolam (XANAX) 1 MG tablet Take 1 mg by mouth QID.     Marland Kitchen amitriptyline (ELAVIL) 25 MG tablet Take 2 tablets (50 mg total) by mouth daily. 180 tablet 4  . cholecalciferol (VITAMIN D3) 25 MCG (1000 UT) tablet Take 1,000 Units by mouth daily.    . cloNIDine (CATAPRES) 0.2 MG tablet Take 0.2 mg by mouth daily.    . colchicine 0.6 MG tablet Take 1 tablet (0.6 mg total) by mouth 2 (two) times daily. Prn gout 60 tablet 2  . etonogestrel (IMPLANON) 68 MG IMPL implant Inject 1 each into the skin continuous. Reported on 01/27/2016    . famotidine (PEPCID) 20 MG tablet Take 1 tablet (20 mg total) by mouth 2 (two) times daily. 60 tablet 11  . fluticasone (FLONASE) 50 MCG/ACT nasal spray Place 2 sprays into both nostrils daily. 16 g 11  . gabapentin (NEURONTIN) 300 MG capsule TAKE 1 OR 2 CAPSULES BY MOUTH 3 TIMES A DAY (Patient taking differently: Take 600 mg by mouth 3 (three) times daily. ) 180 capsule 5  . HYDROcodone-acetaminophen (NORCO) 5-325 MG tablet Take 1 tablet by mouth every 6 (six) hours as needed. 20 tablet 0  . meclizine  (ANTIVERT) 25 MG tablet Take 25 mg by mouth 3 (three) times daily as needed for dizziness.    Marland Kitchen MYRBETRIQ 25 MG TB24 tablet TAKE 1 TABLET BY MOUTH ONCE A DAY 30 tablet 5  . ondansetron (ZOFRAN-ODT) 4 MG disintegrating tablet TAKE 1 TABLET BY MOUTH EVERY 8 HOURS AS NEEDED FOR NAUSEA AND VOMITING 30 tablet 3  . pantoprazole (PROTONIX) 40 MG tablet Take 1 tablet (40 mg total) by mouth daily. 30 tablet 1  . PARoxetine (PAXIL) 40 MG tablet Take 1 tablet (40 mg total) by mouth daily. (Patient taking differently: Take 40 mg by mouth 2 (two) times daily. ) 30  tablet 11  . promethazine (PHENERGAN) 25 MG tablet TAKE 1 TABLET BY MOUTH EVERY 8 HOURS AS NEEDED FOR NAUSEA AND VOMITING. 30 tablet 5  . promethazine-dextromethorphan (PROMETHAZINE-DM) 6.25-15 MG/5ML syrup Take 5 mLs by mouth at bedtime as needed for cough. Caution of sedation 118 mL 1  . tetrahydrozoline 0.05 % ophthalmic solution Place 1 drop into both eyes daily as needed (for dry eyes).    Marland Kitchen tinidazole (TINDAMAX) 500 MG tablet Take 1,000 mg by mouth daily.    Marland Kitchen topiramate (TOPAMAX) 100 MG tablet Take 1 tablet (100 mg total) by mouth 2 (two) times daily. 60 tablet 2  . valACYclovir (VALTREX) 500 MG tablet TAKE 1 TABLET BY MOUTH ONCE DAILY 30 tablet 5   No current facility-administered medications on file prior to visit.    Review of Systems  Constitutional: Negative for activity change, appetite change, fatigue, fever and unexpected weight change.  HENT: Negative for congestion, ear pain, rhinorrhea, sinus pressure and sore throat.   Eyes: Negative for pain, redness and visual disturbance.  Respiratory: Negative for cough, shortness of breath and wheezing.   Cardiovascular: Negative for chest pain and palpitations.  Gastrointestinal: Negative for abdominal pain, blood in stool, constipation and diarrhea.  Endocrine: Negative for polydipsia and polyuria.  Genitourinary: Negative for dysuria, frequency and urgency.  Musculoskeletal: Negative  for arthralgias, back pain and myalgias.  Skin: Negative for pallor and rash.  Allergic/Immunologic: Negative for environmental allergies.  Neurological: Positive for headaches. Negative for dizziness and syncope.       Headaches have improved with botox treatment   Hematological: Negative for adenopathy. Does not bruise/bleed easily.  Psychiatric/Behavioral: Negative for decreased concentration and dysphoric mood. The patient is not nervous/anxious.        Objective:   Physical Exam Constitutional:      General: She is not in acute distress.    Appearance: Normal appearance. She is well-developed. She is obese. She is not ill-appearing or diaphoretic.  HENT:     Head: Normocephalic and atraumatic.  Eyes:     General: No scleral icterus.    Conjunctiva/sclera: Conjunctivae normal.     Pupils: Pupils are equal, round, and reactive to light.  Neck:     Thyroid: No thyromegaly.     Vascular: No carotid bruit or JVD.  Cardiovascular:     Rate and Rhythm: Normal rate and regular rhythm.     Heart sounds: Normal heart sounds. No gallop.   Pulmonary:     Effort: Pulmonary effort is normal. No respiratory distress.     Breath sounds: Normal breath sounds. No wheezing or rales.     Comments: No wheeze even on forced exp today Does not cough in office Abdominal:     General: Bowel sounds are normal. There is no distension or abdominal bruit.     Palpations: Abdomen is soft. There is no mass.     Tenderness: There is no abdominal tenderness.  Musculoskeletal:     Cervical back: Normal range of motion and neck supple.     Right lower leg: No edema.     Left lower leg: No edema.  Lymphadenopathy:     Cervical: No cervical adenopathy.  Skin:    General: Skin is warm and dry.     Coloration: Skin is not pale.     Findings: No erythema or rash.  Neurological:     Mental Status: She is alert.     Sensory: No sensory deficit.     Coordination:  Coordination normal.     Deep Tendon  Reflexes: Reflexes are normal and symmetric. Reflexes normal.  Psychiatric:        Mood and Affect: Mood normal.           Assessment & Plan:   Problem List Items Addressed This Visit      Cardiovascular and Mediastinum   Essential hypertension - Primary    Pt already takes clonodine for sleep qhs bp is running near 140/90  Disc lifestyle change-given handouts on DASH eating and HTN  Plans to work on wt loss  Wants to avoid diuretic and also has chronic cough so want to avoid ace Will try amlodipine 5 mg daily -disc poss side eff       Relevant Medications   amLODipine (NORVASC) 5 MG tablet     Respiratory   Seasonal allergic rhinitis    This may add to chronic cough  No improvement with antihistamine so far         Other   Chronic cough    No improvement with tx of allergic rhinitis and GERD Px tessalon for cough relief cxr today  May have reactive airways as triggers include cold air and drinks and albuterol helps May benefit from eventual spirometry or PFTs (after pandemic)  Consider inhaled corticosteroid      Relevant Orders   DG Chest 2 View (Completed)

## 2020-01-22 ENCOUNTER — Encounter: Payer: Self-pay | Admitting: Family Medicine

## 2020-01-22 MED ORDER — BUDESONIDE 90 MCG/ACT IN AEPB: 1.0000 | INHALATION_SPRAY | Freq: Two times a day (BID) | RESPIRATORY_TRACT | 11 refills | Status: DC

## 2020-01-28 ENCOUNTER — Ambulatory Visit (INDEPENDENT_AMBULATORY_CARE_PROVIDER_SITE_OTHER): Payer: Medicare Other | Admitting: Psychology

## 2020-01-28 DIAGNOSIS — F331 Major depressive disorder, recurrent, moderate: Secondary | ICD-10-CM | POA: Diagnosis not present

## 2020-01-30 ENCOUNTER — Encounter: Payer: Self-pay | Admitting: Family Medicine

## 2020-02-04 ENCOUNTER — Other Ambulatory Visit: Payer: Self-pay | Admitting: Family Medicine

## 2020-02-04 ENCOUNTER — Other Ambulatory Visit: Payer: Self-pay | Admitting: Nurse Practitioner

## 2020-02-06 ENCOUNTER — Telehealth: Payer: Self-pay

## 2020-02-06 ENCOUNTER — Other Ambulatory Visit: Payer: Self-pay

## 2020-02-06 NOTE — Telephone Encounter (Signed)
Pt is scheduled to see Dr. Carlean Purl on 02/10/20. Pt no showed covid test scheduled on 02/06/20. I was unable to reach patient. Left voicemail for patient to call office back. She will need to be rescheduled for a covid test prior to appt on 4/12.

## 2020-02-07 ENCOUNTER — Other Ambulatory Visit: Payer: Self-pay | Admitting: Internal Medicine

## 2020-02-07 ENCOUNTER — Ambulatory Visit (INDEPENDENT_AMBULATORY_CARE_PROVIDER_SITE_OTHER): Payer: Medicare Other

## 2020-02-07 DIAGNOSIS — Z1159 Encounter for screening for other viral diseases: Secondary | ICD-10-CM

## 2020-02-08 LAB — SARS CORONAVIRUS 2 (TAT 6-24 HRS): SARS Coronavirus 2: NEGATIVE

## 2020-02-10 ENCOUNTER — Other Ambulatory Visit: Payer: Self-pay

## 2020-02-10 ENCOUNTER — Encounter: Payer: Self-pay | Admitting: Internal Medicine

## 2020-02-10 ENCOUNTER — Ambulatory Visit (AMBULATORY_SURGERY_CENTER): Payer: Medicare Other | Admitting: Internal Medicine

## 2020-02-10 VITALS — BP 145/99 | HR 76 | Temp 97.1°F | Resp 18 | Ht 62.0 in | Wt 209.0 lb

## 2020-02-10 DIAGNOSIS — I10 Essential (primary) hypertension: Secondary | ICD-10-CM | POA: Diagnosis not present

## 2020-02-10 DIAGNOSIS — R1012 Left upper quadrant pain: Secondary | ICD-10-CM

## 2020-02-10 DIAGNOSIS — D125 Benign neoplasm of sigmoid colon: Secondary | ICD-10-CM

## 2020-02-10 DIAGNOSIS — K219 Gastro-esophageal reflux disease without esophagitis: Secondary | ICD-10-CM | POA: Diagnosis not present

## 2020-02-10 DIAGNOSIS — Z1211 Encounter for screening for malignant neoplasm of colon: Secondary | ICD-10-CM | POA: Diagnosis not present

## 2020-02-10 MED ORDER — SODIUM CHLORIDE 0.9 % IV SOLN: 500.0000 mL | Freq: Once | INTRAVENOUS | Status: DC

## 2020-02-10 NOTE — Op Note (Signed)
[place] Patient Name: Dana Greer [facility] Procedure Date: 02/10/2020 2:39 PM MRN: [id number] Endoscopist: Gatha Mayer , MD Age: 49 Referring MD:  Date of Birth: [birth year] Gender: Female Account #: [id number] Procedure:                Colonoscopy Indications:              Screening for colorectal malignant neoplasm, This                            is the patient's first colonoscopy Medicines:                Propofol per Anesthesia, Monitored Anesthesia Care Procedure:                Pre-Anesthesia Assessment:                           - Prior to the procedure, a History and Physical                            was performed, and patient medications and                            allergies were reviewed. The patient's tolerance of                            previous anesthesia was also reviewed. The risks                            and benefits of the procedure and the sedation                            options and risks were discussed with the patient.                            All questions were answered, and informed consent                            was obtained. Prior Anticoagulants: The patient has                            taken no previous anticoagulant or antiplatelet                            agents. ASA Grade Assessment: III - A patient with                            severe systemic disease. After reviewing the risks                            and benefits, the patient was deemed in                            satisfactory condition to undergo the procedure.  After obtaining informed consent, the colonoscope                            was passed under direct vision. Throughout the                            procedure, the patient's blood pressure, pulse, and                            oxygen saturations were monitored continuously. The                            Colonoscope was introduced through the anus and         advanced to the the cecum, identified by                            appendiceal orifice and ileocecal valve. The                            colonoscopy was performed without difficulty. The                            patient tolerated the procedure well. The quality                            of the bowel preparation was good. The ileocecal                            valve, appendiceal orifice, and rectum were                            photographed. The bowel preparation used was                            Miralax via split dose instruction. Scope In: 3:01:44 PM Scope Out: 3:16:57 PM Scope Withdrawal Time: 0 hours 12 minutes 54 seconds  Total Procedure Duration: 0 hours 15 minutes 13 seconds  Findings:                 The perianal and digital rectal examinations were                            normal.                           A diminutive polyp was found in the sigmoid colon.                            The polyp was sessile. The polyp was removed with a                            cold snare. Resection and retrieval were complete.  Verification of patient identification for the                            specimen was done. Estimated blood loss was minimal.                           The exam was otherwise without abnormality on                            direct and retroflexion views. Complications:            No immediate complications. Estimated Blood Loss:     Estimated blood loss was minimal. Impression:               - One diminutive polyp in the sigmoid colon,                            removed with a cold snare. Resected and retrieved.                           - The examination was otherwise normal on direct                            and retroflexion views. Recommendation:           - Patient has a contact number available for                            emergencies. The signs and symptoms of potential                            delayed  complications were discussed with the                            patient. Return to normal activities tomorrow.                            Written discharge instructions were provided to the                            patient.                           - Resume previous diet.                           - Continue present medications.                           - Repeat colonoscopy is recommended. The                            colonoscopy date will be determined after pathology                            results from today's exam become available for  review. Gatha Mayer, MD 02/10/2020 3:27:49 PM This report has been signed electronically.

## 2020-02-10 NOTE — Op Note (Signed)
[place] Patient Name: Dana Greer [facility] Procedure Date: 02/10/2020 2:39 PM MRN: [id number] Endoscopist: Gatha Mayer , MD Age: 49 Referring MD:  Date of Birth: [birth year] Gender: Female Account #: [id number] Procedure:                Upper GI endoscopy Indications:              Abdominal pain in the left upper quadrant Medicines:                Propofol per Anesthesia, Monitored Anesthesia Care Procedure:                Pre-Anesthesia Assessment:                           - Prior to the procedure, a History and Physical                            was performed, and patient medications and                            allergies were reviewed. The patient's tolerance of                            previous anesthesia was also reviewed. The risks                            and benefits of the procedure and the sedation                            options and risks were discussed with the patient.                            All questions were answered, and informed consent                            was obtained. Prior Anticoagulants: The patient has                            taken no previous anticoagulant or antiplatelet                            agents. ASA Grade Assessment: III - A patient with                            severe systemic disease. After reviewing the risks                            and benefits, the patient was deemed in                            satisfactory condition to undergo the procedure.                           After obtaining informed consent, the endoscope was  passed under direct vision. Throughout the                            procedure, the patient's blood pressure, pulse, and                            oxygen saturations were monitored continuously. The                            Endoscope was introduced through the mouth, and                            advanced to the second part of duodenum. The upper                      GI endoscopy was accomplished without difficulty.                            The patient tolerated the procedure well. Scope In: Scope Out: Findings:                 The esophagus was normal.                           The stomach was normal.                           The examined duodenum was normal.                           The cardia and gastric fundus were normal on                            retroflexion. Complications:            No immediate complications. Estimated Blood Loss:     Estimated blood loss: none. Impression:               - Normal esophagus.                           - Normal stomach.                           - Normal examined duodenum.                           - No specimens collected. Recommendation:           - Patient has a contact number available for                            emergencies. The signs and symptoms of potential                            delayed complications were discussed with the                            patient. Return to  normal activities tomorrow.                            Written discharge instructions were provided to the                            patient.                           - Resume previous diet.                           - Continue present medications.                           - See the other procedure note for documentation of                            additional recommendations. colonoscopy next                           - negative EGD, negative CT abd/pelvis - chronic                            recurrent LUQ pain - do no think this is GI in                            origin. irt could be related to fibromyalgia.                           No further GI evaluation or treatment - return to                            PCP - ? adjust neuropathic pain meds Gatha Mayer, MD 02/10/2020 3:25:44 PM This report has been signed electronically.

## 2020-02-10 NOTE — Progress Notes (Signed)
Report given to PACU, vss 

## 2020-02-10 NOTE — Progress Notes (Signed)
Patient used marijuana around 10am this morning.

## 2020-02-10 NOTE — Progress Notes (Signed)
Pt's states no medical or surgical changes since previsit or office visit.  ADB - temp DT - vitals

## 2020-02-10 NOTE — Progress Notes (Signed)
Called to room to assist during endoscopic procedure.  Patient ID and intended procedure confirmed with present staff. Received instructions for my participation in the procedure from the performing physician.  

## 2020-02-10 NOTE — Patient Instructions (Addendum)
The esophagus, stomach and upper intestine all look ok.  I do not think the pain in the left side is coming from the gastrointestinal tract. It may be related to your fibromyalgia.   You should follow-up with Dr. Glori Bickers and your other doctors about this - you are already on gabapentin and amitriptyline - might need doses changed.  There was one tiny colon polyp seen and reomoved. I will let you know pathology results and when to have another routine colonoscopy by mail and/or My Chart.  I appreciate the opportunity to care for you. Gatha Mayer, MD, FACG \  YOU HAD AN ENDOSCOPIC PROCEDURE TODAY AT [facility]:   Refer to the procedure report that was given to you for any specific questions about what was found during the examination.  If the procedure report does not answer your questions, please call your gastroenterologist to clarify.  If you requested that your care partner not be given the details of your procedure findings, then the procedure report has been included in a sealed envelope for you to review at your convenience later.  YOU SHOULD EXPECT: Some feelings of bloating in the abdomen. Passage of more gas than usual.  Walking can help get rid of the air that was put into your GI tract during the procedure and reduce the bloating. If you had a lower endoscopy (such as a colonoscopy or flexible sigmoidoscopy) you may notice spotting of blood in your stool or on the toilet paper. If you underwent a bowel prep for your procedure, you may not have a normal bowel movement for a few days.  Please Note:  You might notice some irritation and congestion in your nose or some drainage.  This is from the oxygen used during your procedure.  There is no need for concern and it should clear up in a day or so.  SYMPTOMS TO REPORT IMMEDIATELY:   Following lower endoscopy (colonoscopy or flexible sigmoidoscopy):  Excessive amounts of blood in the stool  Significant tenderness or  worsening of abdominal pains  Swelling of the abdomen that is new, acute  Fever of 100F or higher   Following upper endoscopy (EGD)  Vomiting of blood or coffee ground material  New chest pain or pain under the shoulder blades  Painful or persistently difficult swallowing  New shortness of breath  Fever of 100F or higher  Black, tarry-looking stools  For urgent or emergent issues, a gastroenterologist can be reached at any hour by calling [phone]. Do not use MyChart messaging for urgent concerns.    DIET:  We do recommend a small meal at first, but then you may proceed to your regular diet.  Drink plenty of fluids but you should avoid alcoholic beverages for 24 hours.  ACTIVITY:  You should plan to take it easy for the rest of today and you should NOT DRIVE or use heavy machinery until tomorrow (because of the sedation medicines used during the test).    FOLLOW UP: Our staff will call the number listed on your records 48-72 hours following your procedure to check on you and address any questions or concerns that you may have regarding the information given to you following your procedure. If we do not reach you, we will leave a message.  We will attempt to reach you two times.  During this call, we will ask if you have developed any symptoms of COVID 19. If you develop any symptoms (ie: fever, flu-like symptoms, shortness  of breath, cough etc.) before then, please call [phone].  If you test positive for Covid 19 in the 2 weeks post procedure, please call and report this information to [place].    If any biopsies were taken you will be contacted by phone or by letter within the next 1-3 weeks.  Please call us at [phone] if you have not heard about the biopsies in 3 weeks.    SIGNATURES/CONFIDENTIALITY: You and/or your care partner have signed paperwork which will be entered into your electronic medical record.  These signatures attest to the fact that that the information  above on your After Visit Summary has been reviewed and is understood.  Full responsibility of the confidentiality of this discharge information lies with you and/or your care-partner.

## 2020-02-11 ENCOUNTER — Ambulatory Visit: Payer: Medicare Other | Admitting: Psychology

## 2020-02-12 ENCOUNTER — Telehealth: Payer: Self-pay | Admitting: *Deleted

## 2020-02-12 NOTE — Telephone Encounter (Signed)
  Follow up Call-  Call back number 02/10/2020  Post procedure Call Back phone  # [phone]  Permission to leave phone message Yes  Some recent data might be hidden     Patient questions:  Do you have a fever, pain , or abdominal swelling? No. Pain Score  0 *  Have you tolerated food without any problems? Yes.    Have you been able to return to your normal activities? Yes.    Do you have any questions about your discharge instructions: Diet   No. Medications  No. Follow up visit  No.  Do you have questions or concerns about your Care? No.  Actions: * If pain score is 4 or above: No action needed, pain <4.  1. Have you developed a fever since your procedure? no  2.   Have you had an respiratory symptoms (SOB or cough) since your procedure? no  3.   Have you tested positive for COVID 19 since your procedure no  4.   Have you had any family members/close contacts diagnosed with the COVID 19 since your procedure?  no   If yes to any of these questions please route to Joylene John, RN and Erenest Rasher, RN

## 2020-02-14 ENCOUNTER — Encounter: Payer: Self-pay | Admitting: Internal Medicine

## 2020-02-14 DIAGNOSIS — Z86010 Personal history of colonic polyps: Secondary | ICD-10-CM

## 2020-02-14 DIAGNOSIS — Z860101 Personal history of adenomatous and serrated colon polyps: Secondary | ICD-10-CM | POA: Insufficient documentation

## 2020-02-14 HISTORY — DX: Personal history of adenomatous and serrated colon polyps: Z86.0101

## 2020-02-14 HISTORY — DX: Personal history of colonic polyps: Z86.010

## 2020-02-21 ENCOUNTER — Encounter: Payer: Self-pay | Admitting: Family Medicine

## 2020-02-21 DIAGNOSIS — M549 Dorsalgia, unspecified: Secondary | ICD-10-CM

## 2020-02-21 DIAGNOSIS — G8929 Other chronic pain: Secondary | ICD-10-CM

## 2020-02-25 ENCOUNTER — Ambulatory Visit (INDEPENDENT_AMBULATORY_CARE_PROVIDER_SITE_OTHER): Payer: Medicare Other | Admitting: Psychology

## 2020-02-25 DIAGNOSIS — F331 Major depressive disorder, recurrent, moderate: Secondary | ICD-10-CM | POA: Diagnosis not present

## 2020-02-25 DIAGNOSIS — M549 Dorsalgia, unspecified: Secondary | ICD-10-CM | POA: Insufficient documentation

## 2020-02-25 NOTE — Telephone Encounter (Signed)
Ref for orthopedics Will route to pcc

## 2020-03-09 ENCOUNTER — Encounter: Payer: Self-pay | Admitting: Family Medicine

## 2020-03-09 ENCOUNTER — Ambulatory Visit (INDEPENDENT_AMBULATORY_CARE_PROVIDER_SITE_OTHER): Payer: Medicare Other

## 2020-03-09 ENCOUNTER — Other Ambulatory Visit: Payer: Self-pay

## 2020-03-09 ENCOUNTER — Ambulatory Visit (INDEPENDENT_AMBULATORY_CARE_PROVIDER_SITE_OTHER): Payer: Medicare Other | Admitting: Family Medicine

## 2020-03-09 DIAGNOSIS — G8929 Other chronic pain: Secondary | ICD-10-CM | POA: Diagnosis not present

## 2020-03-09 DIAGNOSIS — M545 Low back pain, unspecified: Secondary | ICD-10-CM

## 2020-03-09 DIAGNOSIS — M546 Pain in thoracic spine: Secondary | ICD-10-CM

## 2020-03-09 MED ORDER — BACLOFEN 10 MG PO TABS: 5.0000 mg | ORAL_TABLET | Freq: Three times a day (TID) | ORAL | 3 refills | Status: DC | PRN
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
## 2020-03-09 NOTE — Progress Notes (Signed)
Juwana Radziewicz - 49 y.o. female MRN [id number]  Date of birth: Sep 02, 1971  Office Visit Note: Visit Date: 03/09/2020 PCP: Abner Greenspan, MD Referred by: [name], Wynelle Fanny, MD  Subjective: Chief Complaint  Patient presents with  . Lower Back - Pain   HPI: Willard D [name] is a 49 y.o. female who comes in today with left sided lower rib cage pain for the past 5 years. She reports that she has had pain that has been getting progressively worse. It used to improve with forward flexion of spine and swinging of arms. It has progressed to the point where it hurts to stand or walk. Worse with lying on right side. She has pain that moves from lower thoracic spine spreading to the left side around her rib cage. She has had a full workup including CT abdomen, colonoscopy. She reports that the pain is debilitating and she is tearful.    ROS Otherwise per HPI.  Assessment & Plan: Visit Diagnoses:  1. Chronic left-sided low back pain without sciatica   2. Chronic left-sided thoracic back pain     Plan: Mid back pain/lower left sided rib pain- imaging of spine shows significant thoracic scoliosis and CT abdomen reveals stenosis at T10-T11 which could be the cause of her pain with possible disc impingement causing referral pattern over rib cage. Will obtain MRI thoracic spine and will proceed with epidural injection, chiropractor, or PT if this is present. In the interim, will trial a muscle relaxer for pain relief.  Meds & Orders:  Meds ordered this encounter  Medications  . baclofen (LIORESAL) 10 MG tablet    Sig: Take 0.5-1 tablets (5-10 mg total) by mouth 3 (three) times daily as needed for muscle spasms.    Dispense:  30 each    Refill:  3    Orders Placed This Encounter  Procedures  . XR Lumbar Spine 2-3 Views  . MR Thoracic Spine w/o contrast    Follow-up: No follow-ups on file.   Procedures: No procedures performed  No notes on file   Clinical History: No specialty  comments available.   She reports that she has never smoked. She has never used smokeless tobacco.  Recent Labs    04/10/19 1028  HGBA1C 6.0    Objective:  VS:  HT:    WT:   BMI:     BP:   HR: bpm  TEMP: ( )  RESP:  Physical Exam  PHYSICAL EXAM: Gen: NAD, alert, cooperative with exam, well-appearing HEENT: clear conjunctiva,  CV:  no edema, capillary refill brisk, normal rate Resp: non-labored Skin: no rashes, normal turgor  Neuro: no gross deficits.  Psych:  alert and oriented  Ortho Exam  Lumbar spine: - Inspection: no gross deformity or asymmetry, swelling or ecchymosis - Palpation: TTP over ~T10 and over lower ribs, extending around left side - ROM: full active ROM of the lumbar spine in flexion and extension without pain - Strength: 5/5 strength of lower extremity in L4-S1 nerve root distributions b/l; normal gait - Neuro: sensation intact in the L4-S1 nerve root distribution b/l, 2+ L4 and S1 reflexes  Imaging: X-ray thoracic spine: thoraco-lumbar scoliosis, apex to the right CT from March 2021 shows T10-T11 impingement Past Medical/Family/Surgical/Social History: Medications & Allergies reviewed per EMR, new medications updated. Patient Active Problem List   Diagnosis Date Noted  . Left-sided back pain 02/25/2020  . Hx of adenomatous polyp of colon 02/14/2020  . Seasonal allergic rhinitis  01/21/2020  . Chronic cough 10/04/2019  . Headache, post-traumatic, acute 01/28/2019  . Head injury, closed 01/17/2019  . Post concussive syndrome 01/17/2019  . Abnormal vaginal bleeding 07/06/2018  . Neck pain 06/25/2018  . Chronic migraine 04/12/2018  . B12 deficiency 01/10/2018  . Foot pain, bilateral 01/09/2018  . Hand paresthesia 01/09/2018  . Vitamin D deficiency 01/09/2018  . Upper abdominal pain 11/22/2016  . Chronic myofascial pain 10/26/2016  . HSV-2 infection 08/17/2016  . Adverse effects of medication 11/25/2015  . Vertigo 10/08/2015  . Carpal tunnel  syndrome 03/16/2015  . Gout 12/22/2014  . Vertiginous migraine 02/03/2014  . Intractable migraine 08/31/2012  . Osteoarthritis 05/11/2012  . Female bladder prolapse 05/11/2012  . ADJ DISORDER WITH MIXED ANXIETY & DEPRESSED MOOD 06/30/2008  . HYPERHIDROSIS 01/25/2008  . BACK PAIN, LUMBAR, CHRONIC 10/16/2007  . REACTION, ACUTE STRESS W/EMOTIONAL DSTURB 08/01/2007  . Prediabetes 08/01/2007  . Essential hypertension 08/01/2007  . Goiter 07/11/2007  . POLYCYSTIC OVARIAN DISEASE 05/24/2007  . HIRSUTISM 05/24/2007  . Arthropathy, multiple sites 05/24/2007   Past Medical History:  Diagnosis Date  . Arthritis   . Depression   . Fibromyalgia   . Headache(784.0)   . Hx of adenomatous polyp of colon 02/14/2020  . Hypertension   . Immune deficiency disorder ([place])   . Migraine headache   . Shingles   . Stroke ([place])   . Thrombocytopenia ([place]) 10/01/2012   Family History  Problem Relation Age of Onset  . Diabetes Father   . COPD Father   . Healthy Mother   . Breast cancer Paternal Aunt   . Colon cancer Paternal Grandmother   . Rectal cancer Paternal Grandmother   . Lung cancer Paternal Uncle   . Lung cancer Paternal Aunt   . Esophageal cancer Cousin   . Stomach cancer Neg Hx    Past Surgical History:  Procedure Laterality Date  . ABDOMINAL HYSTERECTOMY    . CARPAL TUNNEL RELEASE Left 09/25/2018   Procedure: LEFT CARPAL TUNNEL RELEASE;  Surgeon: Daryll Brod, MD;  Location: [facility];  Service: Orthopedics;  Laterality: Left;  . CESAREAN SECTION  2008   x1  . PARTIAL HYSTERECTOMY  2012   due to fibroid  . UPPER GASTROINTESTINAL ENDOSCOPY    . [place] EXTRACTION  1995   Social History   Occupational History  . Occupation: Unemployed  Tobacco Use  . Smoking status: Never Smoker  . Smokeless tobacco: Never Used  Substance and Sexual Activity  . Alcohol use: Yes    Comment: occasional  . Drug use: Yes    Types: Marijuana    Comment: smokes small amount  twice daily  . Sexual activity: Yes    Birth control/protection: None
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
## 2020-03-10 ENCOUNTER — Ambulatory Visit (INDEPENDENT_AMBULATORY_CARE_PROVIDER_SITE_OTHER): Payer: Medicare Other | Admitting: Psychology

## 2020-03-10 DIAGNOSIS — F331 Major depressive disorder, recurrent, moderate: Secondary | ICD-10-CM | POA: Diagnosis not present

## 2020-03-11 ENCOUNTER — Encounter: Payer: Self-pay | Admitting: Family Medicine

## 2020-03-11 DIAGNOSIS — G8929 Other chronic pain: Secondary | ICD-10-CM

## 2020-03-24 ENCOUNTER — Ambulatory Visit (INDEPENDENT_AMBULATORY_CARE_PROVIDER_SITE_OTHER): Payer: Medicare Other | Admitting: Psychology

## 2020-03-24 DIAGNOSIS — F331 Major depressive disorder, recurrent, moderate: Secondary | ICD-10-CM

## 2020-04-06 ENCOUNTER — Encounter: Payer: Self-pay | Admitting: Family Medicine

## 2020-04-06 ENCOUNTER — Telehealth (INDEPENDENT_AMBULATORY_CARE_PROVIDER_SITE_OTHER): Payer: Medicare Other | Admitting: Family Medicine

## 2020-04-06 VITALS — Temp 98.3°F | Ht 62.0 in | Wt 204.0 lb

## 2020-04-06 DIAGNOSIS — G43709 Chronic migraine without aura, not intractable, without status migrainosus: Secondary | ICD-10-CM | POA: Diagnosis not present

## 2020-04-06 DIAGNOSIS — A084 Viral intestinal infection, unspecified: Secondary | ICD-10-CM | POA: Diagnosis not present

## 2020-04-06 DIAGNOSIS — IMO0002 Reserved for concepts with insufficient information to code with codable children: Secondary | ICD-10-CM

## 2020-04-06 NOTE — Assessment & Plan Note (Signed)
Currently with migraine as well. Encouraged hydration. Her HA specialist just left, so will place new referral as previously getting botox for treatment. Trial of Riboflavin as she feels topimax does not see to be helping for suppression.

## 2020-04-06 NOTE — Progress Notes (Signed)
I connected with Dana Greer on 04/06/20 at  2:00 PM EDT by video and verified that I am speaking with the correct person using two identifiers.   I discussed the limitations, risks, security and privacy concerns of performing an evaluation and management service by video and the availability of in person appointments. I also discussed with the patient that there may be a patient responsible charge related to this service. The patient expressed understanding and agreed to proceed.  Patient location: Home Provider Location: [facility] Participants: Lesleigh Noe and Dreama Saa Goins   Subjective:     Dana Greer is a 49 y.o. female presenting for Fever and Nausea     HPI   #Fever and nausea - started 4:30 am yesterday - stopped around 7-8 pm - was traveling on the road from [place] - stopped around 2 and had extreme dry heaving and has not slept all night - was having sweats and suspected fever yesterday - had to change her clothes - this morning 98.3 and no longer feeling feverish - endorses occasional nausea - taking ondansetron and promethazine w/ improvement - now has a severe headache - has been trying to drink gatorade and pedilyte - sick contact - last week son had a sore throat and negative covid testing  - has not gotten the covid vaccine  Endorses some vertigo symptoms as well   Sees a HA specialists and gets Botox normally - looking for another provider - has been taking topamax and amitryptalin    Review of Systems   Social History   Tobacco Use  Smoking Status Never Smoker  Smokeless Tobacco Never Used        Objective:   BP Readings from Last 3 Encounters:  02/10/20 (!) 145/99  01/21/20 138/90  01/08/20 (!) 144/98   Wt Readings from Last 3 Encounters:  04/06/20 204 lb (92.5 kg)  02/10/20 209 lb (94.8 kg)  01/21/20 214 lb 11.2 oz (97.4 kg)   Temp 98.3 F (36.8 C) (Oral)   Ht 5\' 2"  (1.575 m)   Wt 204 lb  (92.5 kg)   LMP 10/16/2019 (Within Days) Comment: had hysterectomy-still has half of her uterus and ovaries  BMI 37.31 kg/m    Physical Exam Constitutional:      Appearance: Normal appearance. She is not ill-appearing.  HENT:     Head: Normocephalic and atraumatic.     Right Ear: External ear normal.     Left Ear: External ear normal.  Eyes:     Conjunctiva/sclera: Conjunctivae normal.  Pulmonary:     Effort: Pulmonary effort is normal. No respiratory distress.  Neurological:     Mental Status: She is alert. Mental status is at baseline.  Psychiatric:        Mood and Affect: Mood normal.        Behavior: Behavior normal.        Thought Content: Thought content normal.        Judgment: Judgment normal.          Assessment & Plan:   Problem List Items Addressed This Visit      Cardiovascular and Mediastinum   Chronic migraine - Primary    Currently with migraine as well. Encouraged hydration. Her HA specialist just left, so will place new referral as previously getting botox for treatment. Trial of Riboflavin as she feels topimax does not see to be helping for suppression.  Relevant Orders   Ambulatory referral to Neurology    Other Visit Diagnoses    Viral gastroenteritis         Suspect with symptom improvement that likely viral stomach illness. Recommend continued hydration and prn antiemetics (already prescribed). If symptoms do not improve recommend call back.    Return if symptoms worsen or fail to improve.  Lesleigh Noe, MD

## 2020-04-06 NOTE — Patient Instructions (Signed)
Headache Prevention  Riboflavin has been shown to be effective in reducing the frequency of migraine headaches. This is a supplement that is safe and well tolerated. The only side effect is a change in urine color.   Take 400 mg of Riboflavin daily.   I recommend picking up a bottle and taking the medication until you finish the bottle. If it helped, continued. If no improvement or change, then do not purchase another bottle.

## 2020-04-07 ENCOUNTER — Ambulatory Visit: Payer: Medicare Other | Admitting: Psychology

## 2020-04-09 ENCOUNTER — Other Ambulatory Visit: Payer: Self-pay

## 2020-04-09 ENCOUNTER — Ambulatory Visit
Admission: RE | Admit: 2020-04-09 | Discharge: 2020-04-09 | Disposition: A | Discharge status: Home / Self Care | Payer: Medicare Other | Source: Ambulatory Visit | Attending: Family Medicine | Admitting: Family Medicine

## 2020-04-09 DIAGNOSIS — M546 Pain in thoracic spine: Secondary | ICD-10-CM

## 2020-04-10 ENCOUNTER — Telehealth: Payer: Self-pay | Admitting: Family Medicine

## 2020-04-10 NOTE — Telephone Encounter (Signed)
MRI scan shows that due to the scoliosis in the spine, the facet joints at multiple levels have become arthritic.  Particularly on the left from T9-T12, the arthritic joints have contributed to bone spurring which causes narrowing of the nerve openings at these levels.  This could be the source of pain.  I recommend consideration of a thoracic epidural steroid injection to try to improve the pain and to also try to determine which level is the cause of pain.

## 2020-04-11 ENCOUNTER — Other Ambulatory Visit: Payer: Medicare Other

## 2020-04-14 NOTE — Telephone Encounter (Signed)
Order has been faxed

## 2020-04-21 ENCOUNTER — Ambulatory Visit (INDEPENDENT_AMBULATORY_CARE_PROVIDER_SITE_OTHER): Payer: Medicare Other | Admitting: Psychology

## 2020-04-21 DIAGNOSIS — F331 Major depressive disorder, recurrent, moderate: Secondary | ICD-10-CM

## 2020-05-05 ENCOUNTER — Ambulatory Visit (INDEPENDENT_AMBULATORY_CARE_PROVIDER_SITE_OTHER): Payer: Medicare Other | Admitting: Psychology

## 2020-05-05 ENCOUNTER — Other Ambulatory Visit: Payer: Self-pay | Admitting: Family Medicine

## 2020-05-05 DIAGNOSIS — F331 Major depressive disorder, recurrent, moderate: Secondary | ICD-10-CM

## 2020-05-07 ENCOUNTER — Other Ambulatory Visit: Payer: Self-pay | Admitting: Nurse Practitioner

## 2020-05-08 NOTE — Telephone Encounter (Signed)
Ok to renew Pantoprazole 40mg  one capsule po QD # 90, no additional refills thx

## 2020-05-12 ENCOUNTER — Encounter: Payer: Self-pay | Admitting: Medical Oncology

## 2020-05-12 ENCOUNTER — Other Ambulatory Visit: Payer: Self-pay

## 2020-05-12 ENCOUNTER — Emergency Department
Admission: EM | Admit: 2020-05-12 | Discharge: 2020-05-12 | Disposition: A | Discharge status: Home / Self Care | Payer: Medicare Other | Attending: Emergency Medicine | Admitting: Emergency Medicine

## 2020-05-12 DIAGNOSIS — R42 Dizziness and giddiness: Secondary | ICD-10-CM | POA: Diagnosis not present

## 2020-05-12 DIAGNOSIS — R002 Palpitations: Secondary | ICD-10-CM | POA: Diagnosis not present

## 2020-05-12 DIAGNOSIS — R1111 Vomiting without nausea: Secondary | ICD-10-CM | POA: Diagnosis not present

## 2020-05-12 DIAGNOSIS — I10 Essential (primary) hypertension: Secondary | ICD-10-CM | POA: Diagnosis not present

## 2020-05-12 DIAGNOSIS — R531 Weakness: Secondary | ICD-10-CM | POA: Diagnosis present

## 2020-05-12 DIAGNOSIS — I499 Cardiac arrhythmia, unspecified: Secondary | ICD-10-CM | POA: Diagnosis not present

## 2020-05-12 LAB — COMPREHENSIVE METABOLIC PANEL
ALT: 13 U/L (ref 0–44)
AST: 23 U/L (ref 15–41)
Albumin: 4.4 g/dL (ref 3.5–5.0)
Alkaline Phosphatase: 46 U/L (ref 38–126)
Anion gap: 9 (ref 5–15)
BUN: 12 mg/dL (ref 6–20)
CO2: 24 mmol/L (ref 22–32)
Calcium: 9.1 mg/dL (ref 8.9–10.3)
Chloride: 106 mmol/L (ref 98–111)
Creatinine, Ser: 1.06 mg/dL — ABNORMAL HIGH (ref 0.44–1.00)
GFR calc Af Amer: >60 mL/min (ref >60)
GFR calc non Af Amer: >60 mL/min (ref >60)
Glucose, Bld: 191 mg/dL — ABNORMAL HIGH (ref 70–99)
Potassium: 3.7 mmol/L (ref 3.5–5.1)
Sodium: 139 mmol/L (ref 135–145)
Total Bilirubin: 0.6 mg/dL (ref 0.3–1.2)
Total Protein: 7.7 g/dL (ref 6.5–8.1)

## 2020-05-12 LAB — URINALYSIS, COMPLETE (UACMP) WITH MICROSCOPIC
Bilirubin Urine: NEGATIVE
Glucose, UA: 150 mg/dL — AB
Ketones, ur: 5 mg/dL — AB
Leukocytes,Ua: NEGATIVE
Nitrite: NEGATIVE
Protein, ur: NEGATIVE mg/dL
Specific Gravity, Urine: 1.012 (ref 1.005–1.030)
pH: 7 (ref 5.0–8.0)

## 2020-05-12 LAB — CBC WITH DIFFERENTIAL/PLATELET
Abs Immature Granulocytes: 0.05 10*3/uL (ref 0.00–0.07)
Basophils Absolute: 0 10*3/uL (ref 0.0–0.1)
Basophils Relative: 0 %
Eosinophils Absolute: 0.1 10*3/uL (ref 0.0–0.5)
Eosinophils Relative: 1 %
HCT: 38.6 % (ref 36.0–46.0)
Hemoglobin: 13.2 g/dL (ref 12.0–15.0)
Immature Granulocytes: 0 %
Lymphocytes Relative: 18 %
Lymphs Abs: 2.2 10*3/uL (ref 0.7–4.0)
MCH: 33.2 pg (ref 26.0–34.0)
MCHC: 34.2 g/dL (ref 30.0–36.0)
MCV: 97 fL (ref 80.0–100.0)
Monocytes Absolute: 0.6 10*3/uL (ref 0.1–1.0)
Monocytes Relative: 5 %
Neutro Abs: 9.3 10*3/uL — ABNORMAL HIGH (ref 1.7–7.7)
Neutrophils Relative %: 76 %
Platelets: 189 10*3/uL (ref 150–400)
RBC: 3.98 MIL/uL (ref 3.87–5.11)
RDW: 13.8 % (ref 11.5–15.5)
WBC: 12.1 10*3/uL — ABNORMAL HIGH (ref 4.0–10.5)
nRBC: 0 % (ref 0.0–0.2)

## 2020-05-12 LAB — TROPONIN I (HIGH SENSITIVITY): Troponin I (High Sensitivity): 5 ng/L (ref <18)

## 2020-05-12 MED ORDER — CLONIDINE HCL 0.1 MG PO TABS
0.2000 mg | ORAL_TABLET | Freq: Once | ORAL | Status: AC
  Administered 2020-05-12: 0.2 mg via ORAL
  Filled 2020-05-12: qty 2

## 2020-05-12 MED ORDER — LORAZEPAM 2 MG/ML IJ SOLN
1.0000 mg | Freq: Once | INTRAMUSCULAR | Status: AC
  Administered 2020-05-12: 1 mg via INTRAVENOUS
  Filled 2020-05-12: qty 1

## 2020-05-12 MED ORDER — ONDANSETRON HCL 4 MG/2ML IJ SOLN
4.0000 mg | Freq: Once | INTRAMUSCULAR | Status: AC
  Administered 2020-05-12: 4 mg via INTRAVENOUS
  Filled 2020-05-12: qty 2

## 2020-05-12 MED ORDER — AMLODIPINE BESYLATE 5 MG PO TABS
5.0000 mg | ORAL_TABLET | Freq: Once | ORAL | Status: AC
  Administered 2020-05-12: 5 mg via ORAL
  Filled 2020-05-12: qty 1

## 2020-05-12 MED ORDER — ONDANSETRON HCL 4 MG/2ML IJ SOLN: 4.0000 mg | Freq: Once | INTRAMUSCULAR | Status: DC

## 2020-05-12 MED ORDER — LORAZEPAM 2 MG/ML IJ SOLN: 0.5000 mg | Freq: Once | INTRAMUSCULAR | Status: DC

## 2020-05-12 MED ORDER — SODIUM CHLORIDE 0.9 % IV SOLN: Freq: Once | INTRAVENOUS | Status: AC

## 2020-05-12 NOTE — ED Triage Notes (Signed)
Pt from home via ems- states that when they arrived pt was laying on floor stating that she felt weak, initial HR was 60's, pt became diaphoretic and dizzy and HR increased to 120. When pt arrived to ED she was actively vomiting. Pt denies pain.

## 2020-05-12 NOTE — ED Notes (Addendum)
Pt left AMA, refused to sign AMA form

## 2020-05-12 NOTE — ED Provider Notes (Signed)
ER Provider Note       Time seen: 9:37 AM    I have reviewed the vital signs and the nursing notes.  HISTORY   Chief Complaint Weakness and Dizziness   HPI Dana Greer is a 49 y.o. female with a history of arthritis, depression, fibromyalgia, hypertension, migraines, shingles, CVA, thrombocytopenia who presents today for weakness.  Patient arrives by EMS from home.  EMS states she was laying in the floor and she felt weak.  Initial heart rate was in the 60s and then she became diaphoretic and dizzy and the heart rate increased to the 120s.  She was actively vomiting on arrival.  Past Medical History:  Diagnosis Date  . Arthritis   . Depression   . Fibromyalgia   . Headache(784.0)   . Hx of adenomatous polyp of colon 02/14/2020  . Hypertension   . Immune deficiency disorder ([place])   . Migraine headache   . Shingles   . Stroke ([place])   . Thrombocytopenia ([place]) 10/01/2012    Past Surgical History:  Procedure Laterality Date  . ABDOMINAL HYSTERECTOMY    . CARPAL TUNNEL RELEASE Left 09/25/2018   Procedure: LEFT CARPAL TUNNEL RELEASE;  Surgeon: Daryll Brod, MD;  Location: [facility];  Service: Orthopedics;  Laterality: Left;  . CESAREAN SECTION  2008   x1  . PARTIAL HYSTERECTOMY  2012   due to fibroid  . UPPER GASTROINTESTINAL ENDOSCOPY    . WISDOM TOOTH EXTRACTION  1995    Allergies Trazodone and nefazodone, Amoxicillin, and Carafate [sucralfate]  Review of Systems Constitutional: Negative for fever. Cardiovascular: Negative for chest pain. Respiratory: Negative for shortness of breath. Gastrointestinal: Negative for abdominal pain, positive for vomiting Musculoskeletal: Negative for back pain. Skin: Negative for rash.  Positive for diaphoresis Neurological: Positive for dizziness  All systems negative/normal/unremarkable except as stated in the HPI  ____________________________________________   PHYSICAL EXAM:  VITAL SIGNS: Vitals:    05/12/20 0941  BP: (!) 213/111  Pulse: 71  Resp: 20  SpO2: 97%    Constitutional: Alert and oriented.  Mild distress Eyes: Conjunctivae are normal. Normal extraocular movements. ENT      Head: Normocephalic and atraumatic.      Nose: No congestion/rhinnorhea.      Mouth/Throat: Mucous membranes are moist.      Neck: No stridor. Cardiovascular: Normal rate, regular rhythm. No murmurs, rubs, or gallops. Respiratory: Normal respiratory effort without tachypnea nor retractions. Breath sounds are clear and equal bilaterally. No wheezes/rales/rhonchi. Gastrointestinal: Soft and nontender. Normal bowel sounds Musculoskeletal: Nontender with normal range of motion in extremities. No lower extremity tenderness nor edema. Neurologic:  Normal speech and language. No gross focal neurologic deficits are appreciated.  Skin:  Skin is warm, diaphoresis is noted Psychiatric: Depressed mood ____________________________________________  EKG: Interpreted by me.  Sinus rhythm with rate of 70 bpm, low voltage, normal axis, normal QT  ____________________________________________   LABS (pertinent positives/negatives)  Labs Reviewed  CBC WITH DIFFERENTIAL/PLATELET - Abnormal; Notable for the following components:      Result Value   WBC 12.1 (*)    Neutro Abs 9.3 (*)    All other components within normal limits  COMPREHENSIVE METABOLIC PANEL - Abnormal; Notable for the following components:   Glucose, Bld 191 (*)    Creatinine, Ser 1.06 (*)    All other components within normal limits  URINALYSIS, COMPLETE (UACMP) WITH MICROSCOPIC - Abnormal; Notable for the following components:   Color, Urine STRAW (*)  APPearance CLEAR (*)    Glucose, UA 150 (*)    Hgb urine dipstick MODERATE (*)    Ketones, ur 5 (*)    Bacteria, UA RARE (*)    All other components within normal limits  CBG MONITORING, ED  TROPONIN I (HIGH SENSITIVITY)  TROPONIN I (HIGH SENSITIVITY)    DIFFERENTIAL  DIAGNOSIS  Dehydration, electrolyte abnormality, near syncope, arrhythmia, MI, hypertensive emergency  ASSESSMENT AND PLAN  Dizziness   Plan: The patient had presented for dizziness and weakness. Patient's labs did not reveal any acute process.  We had restarted her home blood pressure medications because she states she cannot keep them down.  She has some dry heaving here and unfortunately we had a difficult time getting an IV.  Patient became frustrated with this and left AGAINST MEDICAL ADVICE.  Lenise Arena MD    Note: This note was generated in part or whole with voice recognition software. Voice recognition is usually quite accurate but there are transcription errors that can and very often do occur. I apologize for any typographical errors that were not detected and corrected.     Earleen Newport, MD 05/12/20 1218

## 2020-05-14 ENCOUNTER — Encounter: Payer: Self-pay | Admitting: Family Medicine

## 2020-05-15 ENCOUNTER — Other Ambulatory Visit: Payer: Self-pay

## 2020-05-15 ENCOUNTER — Encounter (HOSPITAL_COMMUNITY): Payer: Self-pay | Admitting: Internal Medicine

## 2020-05-15 ENCOUNTER — Inpatient Hospital Stay (HOSPITAL_COMMUNITY)
Admission: EM | Admit: 2020-05-15 | Discharge: 2020-05-18 | DRG: 684 | Disposition: A | Discharge status: Home / Self Care | Payer: Medicare Other | Attending: Internal Medicine | Admitting: Internal Medicine

## 2020-05-15 ENCOUNTER — Emergency Department (HOSPITAL_COMMUNITY): Payer: Medicare Other

## 2020-05-15 ENCOUNTER — Telehealth: Payer: Self-pay

## 2020-05-15 DIAGNOSIS — N179 Acute kidney failure, unspecified: Principal | ICD-10-CM

## 2020-05-15 DIAGNOSIS — Z79899 Other long term (current) drug therapy: Secondary | ICD-10-CM

## 2020-05-15 DIAGNOSIS — I213 ST elevation (STEMI) myocardial infarction of unspecified site: Secondary | ICD-10-CM | POA: Diagnosis not present

## 2020-05-15 DIAGNOSIS — R1110 Vomiting, unspecified: Secondary | ICD-10-CM | POA: Diagnosis not present

## 2020-05-15 DIAGNOSIS — Z825 Family history of asthma and other chronic lower respiratory diseases: Secondary | ICD-10-CM

## 2020-05-15 DIAGNOSIS — Z800 Family history of malignant neoplasm of digestive organs: Secondary | ICD-10-CM

## 2020-05-15 DIAGNOSIS — B009 Herpesviral infection, unspecified: Secondary | ICD-10-CM | POA: Diagnosis present

## 2020-05-15 DIAGNOSIS — R7303 Prediabetes: Secondary | ICD-10-CM | POA: Diagnosis present

## 2020-05-15 DIAGNOSIS — E669 Obesity, unspecified: Secondary | ICD-10-CM | POA: Diagnosis present

## 2020-05-15 DIAGNOSIS — R112 Nausea with vomiting, unspecified: Secondary | ICD-10-CM | POA: Diagnosis not present

## 2020-05-15 DIAGNOSIS — I10 Essential (primary) hypertension: Secondary | ICD-10-CM | POA: Diagnosis not present

## 2020-05-15 DIAGNOSIS — G43909 Migraine, unspecified, not intractable, without status migrainosus: Secondary | ICD-10-CM | POA: Diagnosis present

## 2020-05-15 DIAGNOSIS — Z801 Family history of malignant neoplasm of trachea, bronchus and lung: Secondary | ICD-10-CM

## 2020-05-15 DIAGNOSIS — E860 Dehydration: Secondary | ICD-10-CM | POA: Diagnosis present

## 2020-05-15 DIAGNOSIS — F1220 Cannabis dependence, uncomplicated: Secondary | ICD-10-CM | POA: Diagnosis present

## 2020-05-15 DIAGNOSIS — E66812 Obesity, class 2: Secondary | ICD-10-CM | POA: Diagnosis present

## 2020-05-15 DIAGNOSIS — R110 Nausea: Secondary | ICD-10-CM | POA: Diagnosis not present

## 2020-05-15 DIAGNOSIS — Z7951 Long term (current) use of inhaled steroids: Secondary | ICD-10-CM

## 2020-05-15 DIAGNOSIS — B029 Zoster without complications: Secondary | ICD-10-CM | POA: Diagnosis present

## 2020-05-15 DIAGNOSIS — F329 Major depressive disorder, single episode, unspecified: Secondary | ICD-10-CM | POA: Diagnosis present

## 2020-05-15 DIAGNOSIS — F419 Anxiety disorder, unspecified: Secondary | ICD-10-CM | POA: Diagnosis present

## 2020-05-15 DIAGNOSIS — Z833 Family history of diabetes mellitus: Secondary | ICD-10-CM

## 2020-05-15 DIAGNOSIS — R0789 Other chest pain: Secondary | ICD-10-CM | POA: Diagnosis not present

## 2020-05-15 DIAGNOSIS — R1115 Cyclical vomiting syndrome unrelated to migraine: Secondary | ICD-10-CM | POA: Diagnosis present

## 2020-05-15 DIAGNOSIS — F12188 Cannabis abuse with other cannabis-induced disorder: Secondary | ICD-10-CM

## 2020-05-15 DIAGNOSIS — E876 Hypokalemia: Secondary | ICD-10-CM | POA: Diagnosis present

## 2020-05-15 DIAGNOSIS — M797 Fibromyalgia: Secondary | ICD-10-CM | POA: Diagnosis present

## 2020-05-15 DIAGNOSIS — R000 Tachycardia, unspecified: Secondary | ICD-10-CM | POA: Diagnosis not present

## 2020-05-15 DIAGNOSIS — R0902 Hypoxemia: Secondary | ICD-10-CM | POA: Diagnosis not present

## 2020-05-15 DIAGNOSIS — R079 Chest pain, unspecified: Secondary | ICD-10-CM | POA: Diagnosis not present

## 2020-05-15 DIAGNOSIS — Z8673 Personal history of transient ischemic attack (TIA), and cerebral infarction without residual deficits: Secondary | ICD-10-CM

## 2020-05-15 DIAGNOSIS — M25519 Pain in unspecified shoulder: Secondary | ICD-10-CM | POA: Diagnosis not present

## 2020-05-15 DIAGNOSIS — Z6837 Body mass index (BMI) 37.0-37.9, adult: Secondary | ICD-10-CM

## 2020-05-15 DIAGNOSIS — R1111 Vomiting without nausea: Secondary | ICD-10-CM | POA: Diagnosis not present

## 2020-05-15 DIAGNOSIS — Z20822 Contact with and (suspected) exposure to covid-19: Secondary | ICD-10-CM | POA: Diagnosis present

## 2020-05-15 LAB — GLUCOSE, CAPILLARY: Glucose-Capillary: 151 mg/dL — ABNORMAL HIGH (ref 70–99)

## 2020-05-15 LAB — HEPATIC FUNCTION PANEL
ALT: 15 U/L (ref 0–44)
AST: 19 U/L (ref 15–41)
Albumin: 3.6 g/dL (ref 3.5–5.0)
Alkaline Phosphatase: 43 U/L (ref 38–126)
Bilirubin, Direct: 0.2 mg/dL (ref 0.0–0.2)
Indirect Bilirubin: 0.3 mg/dL (ref 0.3–0.9)
Total Bilirubin: 0.5 mg/dL (ref 0.3–1.2)
Total Protein: 6.7 g/dL (ref 6.5–8.1)

## 2020-05-15 LAB — CBC
HCT: 48.2 % — ABNORMAL HIGH (ref 36.0–46.0)
Hemoglobin: 16 g/dL — ABNORMAL HIGH (ref 12.0–15.0)
MCH: 32.8 pg (ref 26.0–34.0)
MCHC: 33.2 g/dL (ref 30.0–36.0)
MCV: 98.8 fL (ref 80.0–100.0)
Platelets: 181 10*3/uL (ref 150–400)
RBC: 4.88 MIL/uL (ref 3.87–5.11)
RDW: 13.2 % (ref 11.5–15.5)
WBC: 13.4 10*3/uL — ABNORMAL HIGH (ref 4.0–10.5)
nRBC: 0 % (ref 0.0–0.2)

## 2020-05-15 LAB — URINALYSIS, ROUTINE W REFLEX MICROSCOPIC
Bilirubin Urine: NEGATIVE
Glucose, UA: 50 mg/dL — AB
Ketones, ur: 20 mg/dL — AB
Leukocytes,Ua: NEGATIVE
Nitrite: NEGATIVE
Protein, ur: 30 mg/dL — AB
Specific Gravity, Urine: 1.017 (ref 1.005–1.030)
pH: 5 (ref 5.0–8.0)

## 2020-05-15 LAB — RAPID URINE DRUG SCREEN, HOSP PERFORMED
Amphetamines: NOT DETECTED
Barbiturates: NOT DETECTED
Benzodiazepines: POSITIVE — AB
Cocaine: NOT DETECTED
Opiates: NOT DETECTED
Tetrahydrocannabinol: POSITIVE — AB

## 2020-05-15 LAB — HEMOGLOBIN A1C
Hgb A1c MFr Bld: 5.6 % (ref 4.8–5.6)
Mean Plasma Glucose: 114.02 mg/dL

## 2020-05-15 LAB — TROPONIN I (HIGH SENSITIVITY)
Troponin I (High Sensitivity): 7 ng/L (ref <18)
Troponin I (High Sensitivity): 12 ng/L (ref <18)

## 2020-05-15 LAB — BASIC METABOLIC PANEL
Anion gap: 19 — ABNORMAL HIGH (ref 5–15)
BUN: 33 mg/dL — ABNORMAL HIGH (ref 6–20)
CO2: 15 mmol/L — ABNORMAL LOW (ref 22–32)
Calcium: 10 mg/dL (ref 8.9–10.3)
Chloride: 103 mmol/L (ref 98–111)
Creatinine, Ser: 2.79 mg/dL — ABNORMAL HIGH (ref 0.44–1.00)
GFR calc Af Amer: 22 mL/min — ABNORMAL LOW (ref >60)
GFR calc non Af Amer: 19 mL/min — ABNORMAL LOW (ref >60)
Glucose, Bld: 152 mg/dL — ABNORMAL HIGH (ref 70–99)
Potassium: 4 mmol/L (ref 3.5–5.1)
Sodium: 137 mmol/L (ref 135–145)

## 2020-05-15 LAB — SARS CORONAVIRUS 2 BY RT PCR (HOSPITAL ORDER, PERFORMED IN ~~LOC~~ HOSPITAL LAB): SARS Coronavirus 2: NEGATIVE

## 2020-05-15 LAB — LIPASE, BLOOD: Lipase: 19 U/L (ref 11–51)

## 2020-05-15 MED ORDER — ONDANSETRON HCL 4 MG/2ML IJ SOLN
4.0000 mg | Freq: Once | INTRAMUSCULAR | Status: AC
  Administered 2020-05-15: 4 mg via INTRAVENOUS
  Filled 2020-05-15: qty 2

## 2020-05-15 MED ORDER — HYDRALAZINE HCL 20 MG/ML IJ SOLN: 5.0000 mg | INTRAMUSCULAR | Status: DC | PRN

## 2020-05-15 MED ORDER — INSULIN ASPART 100 UNIT/ML ~~LOC~~ SOLN: 0.0000 [IU] | Freq: Three times a day (TID) | SUBCUTANEOUS | Status: DC

## 2020-05-15 MED ORDER — FAMOTIDINE 20 MG PO TABS
20.0000 mg | ORAL_TABLET | Freq: Every day | ORAL | Status: DC
  Administered 2020-05-16 – 2020-05-18 (×3): 20 mg via ORAL
  Filled 2020-05-15 (×3): qty 1

## 2020-05-15 MED ORDER — MIRABEGRON ER 25 MG PO TB24
25.0000 mg | ORAL_TABLET | Freq: Every day | ORAL | Status: DC
  Administered 2020-05-16 – 2020-05-18 (×3): 25 mg via ORAL
  Filled 2020-05-15 (×3): qty 1

## 2020-05-15 MED ORDER — FLUTICASONE PROPIONATE 50 MCG/ACT NA SUSP
2.0000 | Freq: Every day | NASAL | Status: DC | PRN
  Filled 2020-05-15: qty 16

## 2020-05-15 MED ORDER — PANTOPRAZOLE SODIUM 40 MG PO TBEC
40.0000 mg | DELAYED_RELEASE_TABLET | Freq: Every day | ORAL | Status: DC
  Administered 2020-05-15 – 2020-05-18 (×4): 40 mg via ORAL
  Filled 2020-05-15 (×4): qty 1

## 2020-05-15 MED ORDER — ONDANSETRON HCL 4 MG/2ML IJ SOLN
4.0000 mg | Freq: Four times a day (QID) | INTRAMUSCULAR | Status: DC | PRN
  Administered 2020-05-16: 4 mg via INTRAVENOUS
  Filled 2020-05-15: qty 2

## 2020-05-15 MED ORDER — PAROXETINE HCL 20 MG PO TABS
40.0000 mg | ORAL_TABLET | Freq: Two times a day (BID) | ORAL | Status: DC
  Administered 2020-05-15 – 2020-05-18 (×6): 40 mg via ORAL
  Filled 2020-05-15 (×8): qty 2

## 2020-05-15 MED ORDER — ACETAMINOPHEN 650 MG RE SUPP: 650.0000 mg | Freq: Four times a day (QID) | RECTAL | Status: DC | PRN

## 2020-05-15 MED ORDER — ALPRAZOLAM 0.5 MG PO TABS
1.0000 mg | ORAL_TABLET | Freq: Four times a day (QID) | ORAL | Status: DC | PRN
  Administered 2020-05-15 – 2020-05-17 (×3): 1 mg via ORAL
  Filled 2020-05-15 (×3): qty 2

## 2020-05-15 MED ORDER — BUDESONIDE 0.25 MG/2ML IN SUSP
0.2500 mg | Freq: Every day | RESPIRATORY_TRACT | Status: DC
  Administered 2020-05-16 – 2020-05-17 (×2): 0.25 mg via RESPIRATORY_TRACT
  Filled 2020-05-15 (×6): qty 2

## 2020-05-15 MED ORDER — AMITRIPTYLINE HCL 50 MG PO TABS
50.0000 mg | ORAL_TABLET | Freq: Every day | ORAL | Status: DC
  Administered 2020-05-15 – 2020-05-17 (×3): 50 mg via ORAL
  Filled 2020-05-15 (×3): qty 1

## 2020-05-15 MED ORDER — CAPSAICIN 0.025 % EX CREA
TOPICAL_CREAM | Freq: Two times a day (BID) | CUTANEOUS | Status: DC
  Filled 2020-05-15 (×2): qty 60

## 2020-05-15 MED ORDER — VALACYCLOVIR HCL 500 MG PO TABS
500.0000 mg | ORAL_TABLET | Freq: Two times a day (BID) | ORAL | Status: DC
  Administered 2020-05-15 – 2020-05-18 (×6): 500 mg via ORAL
  Filled 2020-05-15 (×7): qty 1

## 2020-05-15 MED ORDER — ENOXAPARIN SODIUM 30 MG/0.3ML ~~LOC~~ SOLN
30.0000 mg | SUBCUTANEOUS | Status: DC
  Administered 2020-05-15 – 2020-05-16 (×2): 30 mg via SUBCUTANEOUS
  Filled 2020-05-15 (×2): qty 0.3

## 2020-05-15 MED ORDER — LACTATED RINGERS IV SOLN: INTRAVENOUS | Status: DC

## 2020-05-15 MED ORDER — ACETAMINOPHEN 325 MG PO TABS: 650.0000 mg | ORAL_TABLET | Freq: Four times a day (QID) | ORAL | Status: DC | PRN

## 2020-05-15 MED ORDER — AMLODIPINE BESYLATE 5 MG PO TABS
5.0000 mg | ORAL_TABLET | Freq: Every day | ORAL | Status: DC
  Administered 2020-05-16 – 2020-05-18 (×3): 5 mg via ORAL
  Filled 2020-05-15 (×3): qty 1

## 2020-05-15 MED ORDER — ONDANSETRON HCL 4 MG PO TABS
4.0000 mg | ORAL_TABLET | Freq: Four times a day (QID) | ORAL | Status: DC | PRN
  Administered 2020-05-15: 4 mg via ORAL
  Filled 2020-05-15: qty 1

## 2020-05-15 MED ORDER — PROMETHAZINE HCL 12.5 MG RE SUPP
25.0000 mg | Freq: Four times a day (QID) | RECTAL | Status: DC | PRN
  Filled 2020-05-15: qty 1

## 2020-05-15 MED ORDER — SODIUM CHLORIDE 0.9 % IV BOLUS
1000.0000 mL | Freq: Once | INTRAVENOUS | Status: AC
  Administered 2020-05-15: 1000 mL via INTRAVENOUS

## 2020-05-15 NOTE — ED Triage Notes (Signed)
Pt BIB [facility] from home. Pt complaining of CP, N/V since Wednesday. Pt seen at [facility] for same. Pt states they told her she needed to see a cardiologist but told her nothing else so just left AMA. Pt received 324 aspirin and nitro x2 with EMS. VSS. NAD.

## 2020-05-15 NOTE — Telephone Encounter (Signed)
[facility] Night - Client Nonclinical Telephone Record AccessNurse Client [facility] Night - Client Client Site [place] Physician Tor Netters- NP Contact Type Call Who Is Calling Patient / Member / Family / Caregiver Caller Name [place] Phone Number [phone] Call Type Message Only Information Provided Reason for Call Returning a Call from the Office Initial [place] states her son missed a call. Disp. Time Disposition Final User 05/14/2020 5:11:48 PM General Information Provided Yes Lawrence Santiago Call Closed By: Lawrence Santiago Transaction Date/Time: 05/14/2020 5:09:57 PM (ET)

## 2020-05-15 NOTE — Telephone Encounter (Signed)
Thank you, I will watch for correspondence

## 2020-05-15 NOTE — ED Provider Notes (Signed)
[place] EMERGENCY DEPARTMENT Provider Note   CSN: [id number] Arrival date & time: 05/15/20  1027     History Chief Complaint  Patient presents with  . Chest Pain  . Nausea  . Emesis    Tabbetha D Madaline Greer is a 49 y.o. female.  HPI  49 year old female with a history of arthritis, fibromyalgia, PCOS, depression, hypertension, migraines, stroke presents to the ER with complaints of chest/epigastric pain, vomiting, and left arm numbness that started today.  Patient was seen at [facility] on Wednesday with similar complaints, left AMA.  I personally reviewed her visit, which did not show any significant lab abnormalities, though there was some moderate blood noted in her urine.  She states that she has continued to have episodes of nausea and vomiting, along with chest pain. She states that she has been sweating a lot, but has had no measurable fevers.  She states that her chest pain is coming from her left shoulder which appears chronic.  She has some shortness of breath with walking as well over the last few days, sometimes with lying down.  She said that her home blood pressure has been high, that she has been having trouble holding down her blood pressure medications.  She states that she took her blood pressure medications this morning.  She has taken some Xanax and promethazine, endorses drinking water, Pedialyte, and follow-up.  She endorses marijuana use-last use was yesterday morning in order to "tolerate the pho" and also endorses using marijuana 2 to 3 days prior.  She denies any alcohol or other drug use.    Past Medical History:  Diagnosis Date  . Arthritis   . Depression   . Fibromyalgia   . Headache(784.0)   . Hx of adenomatous polyp of colon 02/14/2020  . Hypertension   . Immune deficiency disorder ([place])   . Migraine headache   . Shingles   . Stroke ([place])   . Thrombocytopenia ([place]) 10/01/2012    Patient Active Problem List   Diagnosis Date  Noted  . Left-sided back pain 02/25/2020  . Hx of adenomatous polyp of colon 02/14/2020  . Seasonal allergic rhinitis 01/21/2020  . Chronic cough 10/04/2019  . Headache, post-traumatic, acute 01/28/2019  . Head injury, closed 01/17/2019  . Post concussive syndrome 01/17/2019  . Abnormal vaginal bleeding 07/06/2018  . Neck pain 06/25/2018  . Chronic migraine 04/12/2018  . B12 deficiency 01/10/2018  . Foot pain, bilateral 01/09/2018  . Hand paresthesia 01/09/2018  . Vitamin D deficiency 01/09/2018  . Upper abdominal pain 11/22/2016  . Chronic myofascial pain 10/26/2016  . HSV-2 infection 08/17/2016  . Adverse effects of medication 11/25/2015  . Vertigo 10/08/2015  . Carpal tunnel syndrome 03/16/2015  . Gout 12/22/2014  . Vertiginous migraine 02/03/2014  . Intractable migraine 08/31/2012  . Osteoarthritis 05/11/2012  . Female bladder prolapse 05/11/2012  . ADJ DISORDER WITH MIXED ANXIETY & DEPRESSED MOOD 06/30/2008  . HYPERHIDROSIS 01/25/2008  . BACK PAIN, LUMBAR, CHRONIC 10/16/2007  . REACTION, ACUTE STRESS W/EMOTIONAL DSTURB 08/01/2007  . Prediabetes 08/01/2007  . Essential hypertension 08/01/2007  . Goiter 07/11/2007  . POLYCYSTIC OVARIAN DISEASE 05/24/2007  . HIRSUTISM 05/24/2007  . Arthropathy, multiple sites 05/24/2007    Past Surgical History:  Procedure Laterality Date  . ABDOMINAL HYSTERECTOMY    . CARPAL TUNNEL RELEASE Left 09/25/2018   Procedure: LEFT CARPAL TUNNEL RELEASE;  Surgeon: Daryll Brod, MD;  Location: [facility];  Service: Orthopedics;  Laterality: Left;  .  CESAREAN SECTION  2008   x1  . PARTIAL HYSTERECTOMY  2012   due to fibroid  . UPPER GASTROINTESTINAL ENDOSCOPY    . WISDOM TOOTH EXTRACTION  1995     OB History    Gravida  3   Para      Term      Preterm      AB      Living  3     SAB      TAB      Ectopic      Multiple      Live Births           Obstetric Comments  Pt states that she has had a lot of  miscarriages. Do not know how many        Family History  Problem Relation Age of Onset  . Diabetes Father   . COPD Father   . Healthy Mother   . Breast cancer Paternal Aunt   . Colon cancer Paternal Grandmother   . Rectal cancer Paternal Grandmother   . Lung cancer Paternal Uncle   . Lung cancer Paternal Aunt   . Esophageal cancer Cousin   . Stomach cancer Neg Hx     Social History   Tobacco Use  . Smoking status: Never Smoker  . Smokeless tobacco: Never Used  Vaping Use  . Vaping Use: Never used  Substance Use Topics  . Alcohol use: Yes    Comment: occasional  . Drug use: Yes    Types: Marijuana    Comment: smokes small amount twice daily    Home Medications Prior to Admission medications   Medication Sig Start Date End Date Taking? Authorizing Provider  ALPRAZolam Duanne Moron) 1 MG tablet Take 1 mg by mouth 4 (four) times daily as needed for anxiety or sleep.    Yes [provider]  amitriptyline (ELAVIL) 25 MG tablet Take 2 tablets (50 mg total) by mouth daily. 04/12/18  Yes Marcial Pacas, MD  amLODipine (NORVASC) 5 MG tablet Take 1 tablet (5 mg total) by mouth daily. 01/21/20  Yes [name], Wynelle Fanny, MD  cloNIDine (CATAPRES) 0.2 MG tablet Take 0.2 mg by mouth daily.   Yes [provider]  etonogestrel (IMPLANON) 68 MG IMPL implant Inject 1 each into the skin continuous. Reported on 01/27/2016   Yes [provider]  famotidine (PEPCID) 20 MG tablet Take 1 tablet (20 mg total) by mouth 2 (two) times daily. 10/04/19  Yes [name], Wynelle Fanny, MD  fluticasone (FLONASE) 50 MCG/ACT nasal spray Place 2 sprays into both nostrils daily. 10/04/19  Yes [name], Wynelle Fanny, MD  HYDROcodone-acetaminophen (NORCO) 5-325 MG tablet Take 1 tablet by mouth every 6 (six) hours as needed. 09/25/18  Yes Daryll Brod, MD  MYRBETRIQ 25 MG TB24 tablet TAKE 1 TABLET BY MOUTH ONCE A DAY Patient taking differently: Take 25 mg by mouth daily.  11/28/19  Yes [name], Wynelle Fanny, MD  ondansetron  (ZOFRAN-ODT) 4 MG disintegrating tablet TAKE 1 TABLET BY MOUTH EVERY 8 HOURS AS NEEDED FOR NAUSEA AND VOMITING Patient taking differently: Take 4 mg by mouth every 8 (eight) hours as needed for nausea or vomiting.  05/02/19  Yes [name], Wynelle Fanny, MD  pantoprazole (PROTONIX) 40 MG tablet Take 1 tablet (40 mg total) by mouth daily. 05/08/20  Yes Willia Craze, NP  PARoxetine (PAXIL) 40 MG tablet Take 1 tablet (40 mg total) by mouth daily. Patient taking differently: Take 40 mg by mouth 2 (two)  times daily.  05/26/14  Yes [name], Wynelle Fanny, MD  promethazine (PHENERGAN) 25 MG tablet TAKE 1 TABLET BY MOUTH EVERY 8 HOURS AS NEEDED FOR NAUSEA AND VOMITING. Patient taking differently: Take 25 mg by mouth every 8 (eight) hours as needed for nausea or vomiting.  07/29/19  Yes [name], Wynelle Fanny, MD  PULMICORT FLEXHALER 90 MCG/ACT inhaler Inhale 1-2 puffs into the lungs daily. 05/14/20  Yes [provider]  valACYclovir (VALTREX) 500 MG tablet TAKE 1 TABLET BY MOUTH ONCE DAILY Patient taking differently: Take 500 mg by mouth 2 (two) times daily.  02/04/20  Yes [name], Marne A, MD  gabapentin (NEURONTIN) 300 MG capsule TAKE 1 OR 2 CAPSULES BY MOUTH 3 TIMES A DAY Patient not taking: Reported on 05/15/2020 03/15/18   [name], Wynelle Fanny, MD  topiramate (TOPAMAX) 100 MG tablet TAKE 1 TABLET BY MOUTH TWICE A DAY Patient not taking: Reported on 05/15/2020 05/05/20   [name], Wynelle Fanny, MD    Allergies    Trazodone and nefazodone, Amoxicillin, and Carafate [sucralfate]  Review of Systems   Review of Systems  Constitutional: Negative for chills and fever.  HENT: Negative for ear pain and sore throat.   Eyes: Negative for pain and visual disturbance.  Respiratory: Negative for cough and shortness of breath.   Cardiovascular: Positive for chest pain. Negative for palpitations.  Gastrointestinal: Positive for nausea and vomiting. Negative for abdominal pain.  Genitourinary: Negative for dysuria and hematuria.  Musculoskeletal:  Negative for arthralgias and back pain.  Skin: Negative for color change and rash.  Neurological: Positive for numbness. Negative for seizures, syncope, weakness and headaches.  All other systems reviewed and are negative.   Physical Exam Updated Vital Signs BP 93/67   Pulse 96   Temp 98.7 F (37.1 C)   Resp 13   Ht 5\' 2"  (1.575 m)   Wt 92 kg   LMP 10/16/2019 (Within Days) Comment: had hysterectomy-still has half of her uterus and ovaries  SpO2 100%   BMI 37.10 kg/m   Physical Exam Vitals and nursing note reviewed.  Constitutional:      General: She is not in acute distress.    Appearance: She is well-developed. She is not ill-appearing, toxic-appearing or diaphoretic.  HENT:     Head: Normocephalic and atraumatic.  Eyes:     Conjunctiva/sclera: Conjunctivae normal.  Cardiovascular:     Rate and Rhythm: Normal rate and regular rhythm.     Pulses:          Radial pulses are 2+ on the right side and 2+ on the left side.       Dorsalis pedis pulses are 2+ on the right side and 2+ on the left side.     Heart sounds: Normal heart sounds. No murmur heard.   Pulmonary:     Effort: Pulmonary effort is normal. No respiratory distress.     Breath sounds: Normal breath sounds. No decreased breath sounds.  Chest:     Chest wall: No tenderness.  Abdominal:     Palpations: Abdomen is soft.     Tenderness: There is no abdominal tenderness. There is no rebound.  Musculoskeletal:        General: Normal range of motion.     Cervical back: Neck supple.     Right lower leg: No tenderness.  Skin:    General: Skin is warm and dry.  Neurological:     General: No focal deficit present.     Mental Status: She  is alert.     Cranial Nerves: No cranial nerve deficit.     Motor: No weakness.  Psychiatric:        Mood and Affect: Mood normal.        Behavior: Behavior normal.     ED Results / Procedures / Treatments   Labs (all labs ordered are listed, but only abnormal results are  displayed) Labs Reviewed  BASIC METABOLIC PANEL - Abnormal; Notable for the following components:      Result Value   CO2 15 (*)    Glucose, Bld 152 (*)    BUN 33 (*)    Creatinine, Ser 2.79 (*)    GFR calc non Af Amer 19 (*)    GFR calc Af Amer 22 (*)    Anion gap 19 (*)    All other components within normal limits  CBC - Abnormal; Notable for the following components:   WBC 13.4 (*)    Hemoglobin 16.0 (*)    HCT 48.2 (*)    All other components within normal limits  URINALYSIS, ROUTINE W REFLEX MICROSCOPIC - Abnormal; Notable for the following components:   APPearance HAZY (*)    Glucose, UA 50 (*)    Hgb urine dipstick LARGE (*)    Ketones, ur 20 (*)    Protein, ur 30 (*)    Bacteria, UA RARE (*)    All other components within normal limits  RAPID URINE DRUG SCREEN, HOSP PERFORMED - Abnormal; Notable for the following components:   Benzodiazepines POSITIVE (*)    Tetrahydrocannabinol POSITIVE (*)    All other components within normal limits  LIPASE, BLOOD  HEPATIC FUNCTION PANEL  TROPONIN I (HIGH SENSITIVITY)  TROPONIN I (HIGH SENSITIVITY)    EKG EKG Interpretation  Date/Time:  Friday May 15 2020 10:33:35 EDT Ventricular Rate:  98 PR Interval:    QRS Duration: 77 QT Interval:  350 QTC Calculation: 447 R Axis:   39 Text Interpretation: Sinus rhythm Probable left atrial enlargement No STEMI Confirmed by Octaviano Glow [phone]) on 05/15/2020 11:56:00 AM   Radiology DG Chest 2 View  Result Date: 05/15/2020 CLINICAL DATA:  Chest pain additional provided: Chest pain, nausea and vomiting since Wednesday. EXAM: CHEST - 2 VIEW COMPARISON:  Prior chest radiographs 01/21/2020 and earlier FINDINGS: Heart size within normal limits. No appreciable airspace consolidation. No frank pulmonary edema. No evidence of pleural effusion or pneumothorax. No acute bony abnormality identified. Redemonstrated prominent thoracic dextrocurvature. Thoracic spondylosis. IMPRESSION: No evidence  of acute cardiopulmonary abnormality. Redemonstrated prominent thoracic dextrocurvature. Electronically Signed   By: Kellie Simmering DO   On: 05/15/2020 11:36    Procedures Procedures (including critical care time)  Medications Ordered in ED Medications  sodium chloride 0.9 % bolus 1,000 mL (1,000 mLs Intravenous New Bag/Given 05/15/20 1218)  ondansetron (ZOFRAN) injection 4 mg (4 mg Intravenous Given 05/15/20 1216)    ED Course  I have reviewed the triage vital signs and the nursing notes.  Pertinent labs & imaging results that were available during my care of the patient were reviewed by me and considered in my medical decision making (see chart for details).  Clinical Course as of May 15 1533  Fri May 15, 2020  1244 This is a 49 year old female with a history of daily marijuana use presenting to emergency department epigastric pain, nausea vomiting.  Patient was seen at [facility] emergency department 3 days ago for similar symptoms, left without being seen after triage due to long wait.  She returns today complaining of continued vomiting and epigastric pain.  She says since her arrival and receiving Zofran she feels her stomach is settled down significantly.  Labs today are notable mostly for an AKI, Cr 2.79 from 1.06 at [place].  This is likely secondary due to dehydration from her suspected cannabis hyperemesis syndrome.  I discussed with her cessation of cannabis is likely trigger for her symptoms.  I have a low suspicion otherwise for acute appendicitis or intra-abdominal surgical emergency.  We will admit her to observation for IV fluids and recheck of her creatinine   [MT]  1246 Apparently the patient reported chest pains at [facility] and today.  She is not currently having any chest pain.  I suspect is likely referred from epigastrium.  Her EKG is nonischemic and her initial troponin is 7   [MT]    Clinical Course User Index [MT] Wyvonnia Dusky, MD   MDM Rules/Calculators/A&P                          49 year old female with nausea, vomiting, chest pain since Wednesday. On presentation, the patient is alert and oriented, nontoxic-appearing, no acute distress.  Vitals are overall reassuring.  Physical exam with no acute neuro deficits, clear lung sounds, soft and nontender abdomen.  CBC with leukocytosis of 13.4, likely in the setting of nausea and vomiting.  BMP ordered in triage without significant electrolyte abnormalities, however she has been noted to have an AKI, creatinine of 2.79 today, 3 days ago was 1.06.  BUN 33 today, 12 3 days ago. Added on hepatic function panel which did not show any abnormalities. Normal lipase. She also has slightly elevated anion gap of 19.  She states that she has a history of kidney problems from taking too many NSAIDs.  UA with large hemoglobin, no evidence of UTI.  She does not have any flank tenderness or urinary complaints.  Suspicion for kidney stone is low.  UDS with positive benzodiazepines and THC.  Initial troponin of 7.  EKG without any acute abnormalities.  Suspicion for ACS, stroke low.  Suspicion for acute intracranial pathology such as appendicitis, gallbladder disease, or any other intra-abdominal surgical emergency.  Patient received some fluids and Zofran in the ER, and was noted to feel significantly better.  Spoke with [facility] hospitalist group, patient will be admitted for further management of her AKI and likely cannabis hyperemesis syndrome.  Final Clinical Impression(s) / ED Diagnoses Final diagnoses:  AKI (acute kidney injury) ([place])  Cannabis hyperemesis syndrome concurrent with and due to cannabis abuse [facility])    Rx / DC Orders ED Discharge Orders    None       Lyndel Safe 05/15/20 1534    Wyvonnia Dusky, MD 05/15/20 1736

## 2020-05-15 NOTE — Telephone Encounter (Signed)
A lady came in office(pts mom) with pt in the car;pt was having lt arm pain; pt started vomiting last night at 1:30 AM and diarrhea for several days. EMS was called at 5 AM this morning and advised that EKG was OK and there was an 8 - 10 hr wait at ER; pt did not go to hospital ED. Presently pt is having shoulder to hand dull pain and pain is radiating across her upper back at base of neck. Pt took clonidine and amlodipine this morning at 1:30 AM. Presently BP leg cuff rt arm, pt sitting at 45 deg angle BP 190/120. Oxygen level 99% (no oxygen); P 96. Advised pt should go to Ed for Evaluation. Pt voiced understanding but did not think she could get to ED safely by car.I called 911 for transport to [facility] ED per pt request.Pt had a sharp pain in rt arm and pt said still having lt arm pain, pain across upper back and now pain across upper chest. EMS arrived and will transport pt to [facility] ED.pt had continuous monitoring either by Ciearra Rufo LPN or [employer]. FYI to Dr Glori Bickers.

## 2020-05-15 NOTE — H&P (Signed)
History and Physical    Dana Greer ASN:[id number] DOB: 30-Dec-1970 DOA: 05/15/2020  PCP: Abner Greenspan, MD Consultants:  Hilts - orthopedics; Carlean Purl - GI; Joretta Bachelor - neurology Patient coming from:  Home - lives with mother, kids; NOK: Mother, [phone]  Chief Complaint: Chest pain  HPI: Dana Greer is a 49 y.o. female with medical history significant of CVA; HTN; and fibromyalgia/depression presenting with chest pain.  She reports terrible bouts with nausea, prolonged history.  She wakes up vomiting and it continues until it stops.  This time, she was sweating profusely.  She had headache and palpitations.  She had some pain from L shoulder to arm.  She knows that she does not drink enough fluids.  She has had an evaluation for the n/v episodes.  She was told that it was not related to cabbaninoid hyperemesis syndrome.  She smoked yesterday because she couldn't keep anything down.  She has been noticing awakening with abdominal pain and nausea for the last 2 weeks despite normal BMs.  She has been changing her diet to try to decrease her sugar due to concern about diabetes - she has been told she was borderline in the past and she worked hard to change that.  She has lost maybe 10-12 pounds in the last few weeks, unintentional.  Her "doctor", Johny Blamer, a chiropractor, told her not to get the vaccine because her "immune system is very low."    ED Course:  CP/Abd pain/n/v since Tuesday.  Seen on 7/13 at [facility] and left AMA.  Back with similar symptoms.  AKI, creatinine 2.79, previously normal.  Using marijuana, ?cannibinoid hyperemesis.  Review of Systems: As per HPI; otherwise review of systems reviewed and negative.   Ambulatory Status:  Ambulates without assistance, "I won't do it"  COVID Vaccine Status:  None  Past Medical History:  Diagnosis Date  . Arthritis   . Depression   . Fibromyalgia   . Headache(784.0)   . Hx of adenomatous polyp of colon 02/14/2020  .  Hypertension   . Immune deficiency disorder ([place])   . Migraine headache   . Shingles   . Stroke ([place])   . Thrombocytopenia ([place]) 10/01/2012    Past Surgical History:  Procedure Laterality Date  . ABDOMINAL HYSTERECTOMY    . CARPAL TUNNEL RELEASE Left 09/25/2018   Procedure: LEFT CARPAL TUNNEL RELEASE;  Surgeon: Daryll Brod, MD;  Location: [place];  Service: Orthopedics;  Laterality: Left;  . CESAREAN SECTION  2008   x1  . PARTIAL HYSTERECTOMY  2012   due to fibroid  . UPPER GASTROINTESTINAL ENDOSCOPY    . [place] EXTRACTION  1995    Social History   Socioeconomic History  . Marital status: Legally Separated    Spouse name: Not on file  . Number of children: 3  . Years of education: college  . Highest education level: Not on file  Occupational History  . Occupation: Unemployed  Tobacco Use  . Smoking status: Never Smoker  . Smokeless tobacco: Never Used  Vaping Use  . Vaping Use: Never used  Substance and Sexual Activity  . Alcohol use: Yes    Comment: occasional  . Drug use: Yes    Types: Marijuana    Comment: smokes small amount twice daily  . Sexual activity: Yes    Birth control/protection: None  Other Topics Concern  . Not on file  Social History Narrative   Lives at home with her children.  6 cups caffeine per day.   Right-handed.   Social Determinants of Health   Financial Resource Strain:   . Difficulty of Paying Living Expenses:   Food Insecurity:   . Worried About Charity fundraiser in the Last Year:   . Arboriculturist in the Last Year:   Transportation Needs:   . Film/video editor (Medical):   Marland Kitchen Lack of Transportation (Non-Medical):   Physical Activity:   . Days of Exercise per Week:   . Minutes of Exercise per Session:   Stress:   . Feeling of Stress :   Social Connections:   . Frequency of Communication with Friends and Family:   . Frequency of Social Gatherings with Friends and Family:   . Attends  Religious Services:   . Active Member of Clubs or Organizations:   . Attends Archivist Meetings:   Marland Kitchen Marital Status:   Intimate Partner Violence:   . Fear of Current or Ex-Partner:   . Emotionally Abused:   Marland Kitchen Physically Abused:   . Sexually Abused:     Allergies  Allergen Reactions  . Trazodone And Nefazodone Nausea And Vomiting  . Amoxicillin Other (See Comments)    Yeast infection   . Carafate [Sucralfate] Nausea And Vomiting    Family History  Problem Relation Age of Onset  . Diabetes Father   . COPD Father   . Healthy Mother   . Breast cancer Paternal Aunt   . Colon cancer Paternal Grandmother   . Rectal cancer Paternal Grandmother   . Lung cancer Paternal Uncle   . Lung cancer Paternal Aunt   . Esophageal cancer Cousin   . Stomach cancer Neg Hx     Prior to Admission medications   Medication Sig Start Date End Date Taking? Authorizing Provider  ALPRAZolam Duanne Moron) 1 MG tablet Take 1 mg by mouth 4 (four) times daily as needed for anxiety or sleep.    Yes [provider]  amitriptyline (ELAVIL) 25 MG tablet Take 2 tablets (50 mg total) by mouth daily. 04/12/18  Yes Marcial Pacas, MD  amLODipine (NORVASC) 5 MG tablet Take 1 tablet (5 mg total) by mouth daily. 01/21/20  Yes [name], Wynelle Fanny, MD  cloNIDine (CATAPRES) 0.2 MG tablet Take 0.2 mg by mouth daily.   Yes [provider]  etonogestrel (IMPLANON) 68 MG IMPL implant Inject 1 each into the skin continuous. Reported on 01/27/2016   Yes [provider]  famotidine (PEPCID) 20 MG tablet Take 1 tablet (20 mg total) by mouth 2 (two) times daily. 10/04/19  Yes [name], Wynelle Fanny, MD  fluticasone (FLONASE) 50 MCG/ACT nasal spray Place 2 sprays into both nostrils daily. 10/04/19  Yes [name], Wynelle Fanny, MD  HYDROcodone-acetaminophen (NORCO) 5-325 MG tablet Take 1 tablet by mouth every 6 (six) hours as needed. 09/25/18  Yes Daryll Brod, MD  MYRBETRIQ 25 MG TB24 tablet TAKE 1 TABLET BY MOUTH ONCE A  DAY Patient taking differently: Take 25 mg by mouth daily.  11/28/19  Yes [name], Wynelle Fanny, MD  ondansetron (ZOFRAN-ODT) 4 MG disintegrating tablet TAKE 1 TABLET BY MOUTH EVERY 8 HOURS AS NEEDED FOR NAUSEA AND VOMITING Patient taking differently: Take 4 mg by mouth every 8 (eight) hours as needed for nausea or vomiting.  05/02/19  Yes [name], Wynelle Fanny, MD  pantoprazole (PROTONIX) 40 MG tablet Take 1 tablet (40 mg total) by mouth daily. 05/08/20  Yes Willia Craze, NP  PARoxetine (PAXIL) 40 MG tablet  Take 1 tablet (40 mg total) by mouth daily. Patient taking differently: Take 40 mg by mouth 2 (two) times daily.  05/26/14  Yes [name], Wynelle Fanny, MD  promethazine (PHENERGAN) 25 MG tablet TAKE 1 TABLET BY MOUTH EVERY 8 HOURS AS NEEDED FOR NAUSEA AND VOMITING. Patient taking differently: Take 25 mg by mouth every 8 (eight) hours as needed for nausea or vomiting.  07/29/19  Yes [name], Wynelle Fanny, MD  PULMICORT FLEXHALER 90 MCG/ACT inhaler Inhale 1-2 puffs into the lungs daily. 05/14/20  Yes [provider]  valACYclovir (VALTREX) 500 MG tablet TAKE 1 TABLET BY MOUTH ONCE DAILY Patient taking differently: Take 500 mg by mouth 2 (two) times daily.  02/04/20  Yes [name], Wynelle Fanny, MD  gabapentin (NEURONTIN) 300 MG capsule TAKE 1 OR 2 CAPSULES BY MOUTH 3 TIMES A DAY Patient not taking: Reported on 05/15/2020 03/15/18   [name], Wynelle Fanny, MD  topiramate (TOPAMAX) 100 MG tablet TAKE 1 TABLET BY MOUTH TWICE A DAY Patient not taking: Reported on 05/15/2020 05/05/20   Abner Greenspan, MD    Physical Exam: Vitals:   05/15/20 1320 05/15/20 1400 05/15/20 1420 05/15/20 1615  BP: 110/87 128/90 93/67 127/89  Pulse: 92 91 96 97  Resp: 19 11 13  (!) 22  Temp:      SpO2: 98% 98% 100% 100%  Weight:      Height:         . General:  Appears calm and comfortable and is NAD . Eyes:  PERRL, EOMI, normal lids, iris . ENT:  grossly normal hearing, lips & tongue, mmm; appropriate dentition; tongue ring in place . Neck:  no LAD,  masses or thyromegaly . Cardiovascular:  RRR, no m/r/g. No LE edema.  Marland Kitchen Respiratory:   CTA bilaterally with no wheezes/rales/rhonchi.  Normal respiratory effort. . Abdomen:  soft, NT (superficial abd muscle TTP but not deeper TTP), ND, NABS . Skin:  no rash or induration seen on limited exam . Musculoskeletal:  grossly normal tone BUE/BLE, good ROM, no bony abnormality . Psychiatric:  grossly normal mood and affect, speech fluent and appropriate, AOx3 . Neurologic:  CN 2-12 grossly intact, moves all extremities in coordinated fashion    Radiological Exams on Admission: DG Chest 2 View  Result Date: 05/15/2020 CLINICAL DATA:  Chest pain additional provided: Chest pain, nausea and vomiting since Wednesday. EXAM: CHEST - 2 VIEW COMPARISON:  Prior chest radiographs 01/21/2020 and earlier FINDINGS: Heart size within normal limits. No appreciable airspace consolidation. No frank pulmonary edema. No evidence of pleural effusion or pneumothorax. No acute bony abnormality identified. Redemonstrated prominent thoracic dextrocurvature. Thoracic spondylosis. IMPRESSION: No evidence of acute cardiopulmonary abnormality. Redemonstrated prominent thoracic dextrocurvature. Electronically Signed   By: Kellie Simmering DO   On: 05/15/2020 11:36    EKG: Independently reviewed.  NSR with rate 98;  no evidence of acute ischemia   Labs on Admission: I have personally reviewed the available labs and imaging studies at the time of the admission.  Pertinent labs:   CO2 15; 24 on 7/13 Glucose 152 BUN 33/Creatinine 2.79/GFR 22; 12/1.06/>60 on 7/13 HS troponin 7 WBC 13.4 Hgb 16.0 UA: 50 glucose, large Hgb, 20 ketones, 30 protein, rare bacteria UDS + for BZD and THC   Assessment/Plan Principal Problem:   AKI (acute kidney injury) ([place]) Active Problems:   Prediabetes   Essential hypertension   HSV-2 infection   Obesity, Class II, BMI 35-39.9   Non-intractable cyclical vomiting   AKI -Baseline creatinine  was normal on 7/13, now creatinine is 2.79 -Likely due to prerenal failure secondary to dehydration in the setting of cyclical vomiting -IVF at 100 cc/hr -Follow up renal function by BMP -Avoid nephrotoxic agents including ACEI and NSAIDs  Cyclical vomiting -Patient with prior extensive GI evaluation for this condition -May be related to cannabinoid hyperemesis syndrome, as she is continuing to use -Overall, her labs and imaging appear benign at this time other than AKI -By the time I saw her, she had eaten solids and did not appear to be actively vomiting -Will observe overnight -Anticipate d/c to home tomorrow -Encourage marijuana cessation and a good bowel regimen as well as behavioral health support -Continue Protonix -Zofran prn -Zostrix -Will add prn rectal Phenergan for breakthrough n/v  Depression/anxiety -Continue Xanax, Elavil (change to qhs dosing), Paxil -I have reviewed this patient in the [place] Controlled Substances Reporting System.  She is receiving medications from only one provider and appears to be taking them as prescribed. -She is not at particularly high risk of opioid misuse, diversion, or overdose.  HSV-2 -Continue Valtrex  HTN -Continue Norvasc -Hold Clonidine and consider alternative therapy - she is only taking this medication once daily and it is not particularly efficacious with that dosing  Prediabetes -Glucose today is 152 but was 191 on prior ER evaluation -Will check A1c -Suspect that patient is developing DM -Will cover with moderate-scale SSI at this time  Obesity Body mass index is 37.1 kg/m.  -Weight loss should be encouraged -Outpatient PCP/bariatric medicine f/u encouraged  Marijuana dependence -Cessation encouraged; this should be encouraged on an ongoing basis -UDS ordered    Note: This patient has been tested and is pending for the novel coronavirus COVID-19.  DVT prophylaxis:  Lovenox  Code Status:  Full - confirmed with  patient Family Communication: None present Disposition Plan:  The patient is from: home  Anticipated d/c is to: home without [facility] services  Anticipated d/c date will depend on clinical response to treatment, but possibly as early as tomorrow if she has excellent response to treatment  Patient is currently: acutely ill Consults called: None Admission status:  It is my clinical opinion that referral for OBSERVATION is reasonable and necessary in this patient based on the above information provided. The aforementioned taken together are felt to place the patient at high risk for further clinical deterioration. However it is anticipated that the patient may be medically stable for discharge from the hospital within 24 to 48 hours.    Karmen Bongo MD [facility]   How to contact the [facility] Attending or Consulting provider [place] or covering provider during after hours [place], for this patient?  1. Check the care team in [facility] and look for a) attending/consulting TR[facility] provider listed and b) the [facility] team listed 2. Log into [email or web address] and use [place]'s universal password to access. If you do not have the password, please contact the hospital operator. 3. Locate the [facility] provider you are looking for under [facility] and page to a number that you can be directly reached. 4. If you still have difficulty reaching the provider, please page the [facility] (Director on Call) for the Hospitalists listed on amion for assistance.   05/15/2020, 5:14 PM

## 2020-05-15 NOTE — ED Notes (Signed)
Dinner ordered 

## 2020-05-15 NOTE — Plan of Care (Signed)
°  Problem: Education: Goal: Knowledge of General Education information will improve Description: Including pain rating scale, medication(s)/side effects and non-pharmacologic comfort measures Outcome: Progressing   Problem: Education: Goal: Knowledge of General Education information will improve Description: Including pain rating scale, medication(s)/side effects and non-pharmacologic comfort measures Outcome: Progressing   Problem: Education: Goal: Knowledge of General Education information will improve Description: Including pain rating scale, medication(s)/side effects and non-pharmacologic comfort measures Outcome: Progressing   Problem: Education: Goal: Knowledge of General Education information will improve Description: Including pain rating scale, medication(s)/side effects and non-pharmacologic comfort measures Outcome: Progressing   Problem: Education: Goal: Knowledge of General Education information will improve Description: Including pain rating scale, medication(s)/side effects and non-pharmacologic comfort measures Outcome: Progressing   

## 2020-05-16 DIAGNOSIS — Z8673 Personal history of transient ischemic attack (TIA), and cerebral infarction without residual deficits: Secondary | ICD-10-CM | POA: Diagnosis not present

## 2020-05-16 DIAGNOSIS — E860 Dehydration: Secondary | ICD-10-CM | POA: Diagnosis present

## 2020-05-16 DIAGNOSIS — Z20822 Contact with and (suspected) exposure to covid-19: Secondary | ICD-10-CM | POA: Diagnosis present

## 2020-05-16 DIAGNOSIS — R112 Nausea with vomiting, unspecified: Secondary | ICD-10-CM | POA: Diagnosis not present

## 2020-05-16 DIAGNOSIS — Z825 Family history of asthma and other chronic lower respiratory diseases: Secondary | ICD-10-CM | POA: Diagnosis not present

## 2020-05-16 DIAGNOSIS — M797 Fibromyalgia: Secondary | ICD-10-CM | POA: Diagnosis present

## 2020-05-16 DIAGNOSIS — Z6837 Body mass index (BMI) 37.0-37.9, adult: Secondary | ICD-10-CM | POA: Diagnosis not present

## 2020-05-16 DIAGNOSIS — F329 Major depressive disorder, single episode, unspecified: Secondary | ICD-10-CM | POA: Diagnosis present

## 2020-05-16 DIAGNOSIS — Z801 Family history of malignant neoplasm of trachea, bronchus and lung: Secondary | ICD-10-CM | POA: Diagnosis not present

## 2020-05-16 DIAGNOSIS — F419 Anxiety disorder, unspecified: Secondary | ICD-10-CM | POA: Diagnosis present

## 2020-05-16 DIAGNOSIS — Z833 Family history of diabetes mellitus: Secondary | ICD-10-CM | POA: Diagnosis not present

## 2020-05-16 DIAGNOSIS — R7303 Prediabetes: Secondary | ICD-10-CM | POA: Diagnosis present

## 2020-05-16 DIAGNOSIS — F1220 Cannabis dependence, uncomplicated: Secondary | ICD-10-CM | POA: Diagnosis present

## 2020-05-16 DIAGNOSIS — N179 Acute kidney failure, unspecified: Secondary | ICD-10-CM | POA: Diagnosis not present

## 2020-05-16 DIAGNOSIS — B029 Zoster without complications: Secondary | ICD-10-CM | POA: Diagnosis present

## 2020-05-16 DIAGNOSIS — G43909 Migraine, unspecified, not intractable, without status migrainosus: Secondary | ICD-10-CM | POA: Diagnosis present

## 2020-05-16 DIAGNOSIS — Z79899 Other long term (current) drug therapy: Secondary | ICD-10-CM | POA: Diagnosis not present

## 2020-05-16 DIAGNOSIS — I10 Essential (primary) hypertension: Secondary | ICD-10-CM | POA: Diagnosis present

## 2020-05-16 DIAGNOSIS — E669 Obesity, unspecified: Secondary | ICD-10-CM | POA: Diagnosis present

## 2020-05-16 DIAGNOSIS — Z7951 Long term (current) use of inhaled steroids: Secondary | ICD-10-CM | POA: Diagnosis not present

## 2020-05-16 DIAGNOSIS — Z800 Family history of malignant neoplasm of digestive organs: Secondary | ICD-10-CM | POA: Diagnosis not present

## 2020-05-16 DIAGNOSIS — E876 Hypokalemia: Secondary | ICD-10-CM | POA: Diagnosis present

## 2020-05-16 LAB — CBC
HCT: 38.9 % (ref 36.0–46.0)
Hemoglobin: 13 g/dL (ref 12.0–15.0)
MCH: 32.5 pg (ref 26.0–34.0)
MCHC: 33.4 g/dL (ref 30.0–36.0)
MCV: 97.3 fL (ref 80.0–100.0)
Platelets: 151 10*3/uL (ref 150–400)
RBC: 4 MIL/uL (ref 3.87–5.11)
RDW: 13.2 % (ref 11.5–15.5)
WBC: 7 10*3/uL (ref 4.0–10.5)
nRBC: 0 % (ref 0.0–0.2)

## 2020-05-16 LAB — BASIC METABOLIC PANEL
Anion gap: 10 (ref 5–15)
BUN: 18 mg/dL (ref 6–20)
CO2: 23 mmol/L (ref 22–32)
Calcium: 8.8 mg/dL — ABNORMAL LOW (ref 8.9–10.3)
Chloride: 105 mmol/L (ref 98–111)
Creatinine, Ser: 1.37 mg/dL — ABNORMAL HIGH (ref 0.44–1.00)
GFR calc Af Amer: 52 mL/min — ABNORMAL LOW (ref >60)
GFR calc non Af Amer: 45 mL/min — ABNORMAL LOW (ref >60)
Glucose, Bld: 105 mg/dL — ABNORMAL HIGH (ref 70–99)
Potassium: 2.9 mmol/L — ABNORMAL LOW (ref 3.5–5.1)
Sodium: 138 mmol/L (ref 135–145)

## 2020-05-16 LAB — GLUCOSE, CAPILLARY
Glucose-Capillary: 102 mg/dL — ABNORMAL HIGH (ref 70–99)
Glucose-Capillary: 102 mg/dL — ABNORMAL HIGH (ref 70–99)
Glucose-Capillary: 111 mg/dL — ABNORMAL HIGH (ref 70–99)
Glucose-Capillary: 116 mg/dL — ABNORMAL HIGH (ref 70–99)

## 2020-05-16 LAB — HIV ANTIBODY (ROUTINE TESTING W REFLEX): HIV Screen 4th Generation wRfx: NONREACTIVE

## 2020-05-16 MED ORDER — POTASSIUM CHLORIDE CRYS ER 20 MEQ PO TBCR
40.0000 meq | EXTENDED_RELEASE_TABLET | Freq: Once | ORAL | Status: AC
  Administered 2020-05-16: 40 meq via ORAL
  Filled 2020-05-16: qty 2

## 2020-05-16 MED ORDER — POTASSIUM CHLORIDE 10 MEQ/100ML IV SOLN
10.0000 meq | INTRAVENOUS | Status: AC
  Administered 2020-05-16 (×4): 10 meq via INTRAVENOUS
  Filled 2020-05-16 (×4): qty 100

## 2020-05-16 NOTE — Progress Notes (Signed)
Patient unable to tolerate peripheral IV dose of potassium as ordered. C/o burning and sharp pain at IV site and radiating up her arm. IV checked for patency, IV without swelling or infiltration noted. Potassium stopped and maintenance fluids resumed.

## 2020-05-16 NOTE — Plan of Care (Signed)
  Problem: Safety: Goal: Ability to remain free from injury will improve Outcome: Progressing   Problem: Pain Managment: Goal: General experience of comfort will improve Outcome: Progressing   

## 2020-05-16 NOTE — Plan of Care (Signed)

## 2020-05-16 NOTE — Progress Notes (Signed)
Patient agreed to [facility] IV potassium to run concurrent with IV fluids. Will resume potassium replacement at this time.

## 2020-05-16 NOTE — Care Management Obs Status (Signed)
[place] NOTIFICATION   Patient Details  Name: Dana Greer MRN: [id number] Date of Birth: Mar 20, 1971   Medicare Observation Status Notification Given:  Yes    Bartholomew Crews, RN 05/16/2020, 4:46 PM

## 2020-05-16 NOTE — Progress Notes (Addendum)
PROGRESS NOTE    Dana Greer  YKD:[id number] DOB: Aug 13, 1971 DOA: 05/15/2020 PCP: Abner Greenspan, MD    Brief Narrative:  Dana Greer is a 49 y.o. female with medical history significant of CVA; HTN; and fibromyalgia/depression presenting with chest pain.  She reports terrible bouts with nausea, prolonged history.  She wakes up vomiting and it continues until it stops.  This time, she was sweating profusely.  She had headache and palpitations.  She had some pain from L shoulder to arm.  She knows that she does not drink enough fluids.  She has had an evaluation for the n/v episodes.  She was told that it was not related to cabbaninoid hyperemesis syndrome.  She smoked yesterday because she couldn't keep anything down.  She has been noticing awakening with abdominal pain and nausea for the last 2 weeks despite normal BMs.  She has been changing her diet to try to decrease her sugar due to concern about diabetes - she has been told she was borderline in the past and she worked hard to change that.  She has lost maybe 10-12 pounds in the last few weeks, unintentional.  Her "doctor", Johny Blamer, a chiropractor, told her not to get the vaccine because her "immune system is very low."  Covid negative   Consultants:     Procedures:   Antimicrobials:       Subjective: No vomiting this am. She is nervous that it may start again.  Objective: Vitals:   05/15/20 1856 05/15/20 2014 05/16/20 0447 05/16/20 0845  BP: (!) 130/99 129/90 (!) 125/93 127/85  Pulse: 90 89 89 82  Resp: 17 18 16 17   Temp: 98.7 F (37.1 C) 99.1 F (37.3 C) 98.8 F (37.1 C) 98.8 F (37.1 C)  TempSrc: Oral Oral Oral Oral  SpO2: 99% 100% 100% 96%  Weight:      Height:        Intake/Output Summary (Last 24 hours) at 05/16/2020 0948 Last data filed at 05/16/2020 0423 Gross per 24 hour  Intake 1893.43 ml  Output --  Net 1893.43 ml   Filed Weights   05/15/20 1033  Weight: 92 kg     Examination:  General exam: Appears calm and comfortable  Respiratory system: Clear to auscultation. Respiratory effort normal. Cardiovascular system: S1 & S2 heard, RRR. No JVD, murmurs, rubs, gallops or clicks.  Gastrointestinal system: Abdomen is nondistended, soft and nontender. Normal bowel sounds heard. Central nervous system: Alert and oriented. No focal neurological deficits. Extremities: no edema Skin: warm, dry Psychiatry: Judgement and insight appear normal. Mood & affect appropriate.     Data Reviewed: I have personally reviewed following labs and imaging studies  CBC: Recent Labs  Lab 05/12/20 0947 05/15/20 1044 05/16/20 0829  WBC 12.1* 13.4* 7.0  NEUTROABS 9.3*  --   --   HGB 13.2 16.0* 13.0  HCT 38.6 48.2* 38.9  MCV 97.0 98.8 97.3  PLT 189 181 397   Basic Metabolic Panel: Recent Labs  Lab 05/12/20 0947 05/15/20 1044 05/16/20 0829  NA 139 137 138  K 3.7 4.0 2.9*  CL 106 103 105  CO2 24 15* 23  GLUCOSE 191* 152* 105*  BUN 12 33* PENDING  CREATININE 1.06* 2.79* 1.37*  CALCIUM 9.1 10.0 8.8*   GFR: Estimated Creatinine Clearance: 52.5 mL/min (A) (by C-G formula based on SCr of 1.37 mg/dL (H)). Liver Function Tests: Recent Labs  Lab 05/12/20 0947 05/15/20 1344  AST 23 19  ALT 13 15  ALKPHOS 46 43  BILITOT 0.6 0.5  PROT 7.7 6.7  ALBUMIN 4.4 3.6   Recent Labs  Lab 05/15/20 1344  LIPASE 19   No results for input(s): AMMONIA in the last 168 hours. Coagulation Profile: No results for input(s): INR, PROTIME in the last 168 hours. Cardiac Enzymes: No results for input(s): CKTOTAL, CKMB, CKMBINDEX, TROPONINI in the last 168 hours. BNP (last 3 results) No results for input(s): PROBNP in the last 8760 hours. HbA1C: Recent Labs    05/15/20 1702  HGBA1C 5.6   CBG: Recent Labs  Lab 05/15/20 2036 05/16/20 0648  GLUCAP 151* 102*   Lipid Profile: No results for input(s): CHOL, HDL, LDLCALC, TRIG, CHOLHDL, LDLDIRECT in the last 72  hours. Thyroid Function Tests: No results for input(s): TSH, T4TOTAL, FREET4, T3FREE, THYROIDAB in the last 72 hours. Anemia Panel: No results for input(s): VITAMINB12, FOLATE, FERRITIN, TIBC, IRON, RETICCTPCT in the last 72 hours. Sepsis Labs: No results for input(s): PROCALCITON, LATICACIDVEN in the last 168 hours.  Recent Results (from the past 240 hour(s))  SARS Coronavirus 2 by RT PCR (hospital order, performed in [facility] hospital lab) Nasopharyngeal Nasopharyngeal Swab     Status: None   Collection Time: 05/15/20  4:28 PM   Specimen: Nasopharyngeal Swab  Result Value Ref Range Status   SARS Coronavirus 2 NEGATIVE NEGATIVE Final    Comment: (NOTE) SARS-CoV-2 target nucleic acids are NOT DETECTED.  The SARS-CoV-2 RNA is generally detectable in upper and lower respiratory specimens during the acute phase of infection. The lowest concentration of SARS-CoV-2 viral copies this assay can detect is 250 copies / mL. A negative result does not preclude SARS-CoV-2 infection and should not be used as the sole basis for treatment or other patient management decisions.  A negative result may occur with improper specimen collection / handling, submission of specimen other than nasopharyngeal swab, presence of viral mutation(s) within the areas targeted by this assay, and inadequate number of viral copies (<250 copies / mL). A negative result must be combined with clinical observations, patient history, and epidemiological information.  Fact Sheet for Patients:   StrictlyIdeas.no  Fact Sheet for Healthcare Providers: BankingDealers.co.za  This test is not yet approved or  cleared by the [place] FDA and has been authorized for detection and/or diagnosis of SARS-CoV-2 by FDA under an Emergency Use Authorization (EUA).  This EUA will remain in effect (meaning this test can be used) for the duration of the COVID-19 declaration under  Section 564(b)(1) of the Act, 21 U.S.C. section 360bbb-3(b)(1), unless the authorization is terminated or revoked sooner.  Performed at [facility], [place] [address], [place], [place] [address]          Radiology Studies: DG Chest 2 View  Result Date: 05/15/2020 CLINICAL DATA:  Chest pain additional provided: Chest pain, nausea and vomiting since Wednesday. EXAM: CHEST - 2 VIEW COMPARISON:  Prior chest radiographs 01/21/2020 and earlier FINDINGS: Heart size within normal limits. No appreciable airspace consolidation. No frank pulmonary edema. No evidence of pleural effusion or pneumothorax. No acute bony abnormality identified. Redemonstrated prominent thoracic dextrocurvature. Thoracic spondylosis. IMPRESSION: No evidence of acute cardiopulmonary abnormality. Redemonstrated prominent thoracic dextrocurvature. Electronically Signed   By: Kellie Simmering DO   On: 05/15/2020 11:36        Scheduled Meds: . amitriptyline  50 mg Oral QHS  . amLODipine  5 mg Oral Daily  . budesonide  0.25 mg Inhalation Daily  . capsaicin  Topical BID  . enoxaparin (LOVENOX) injection  30 mg Subcutaneous Q24H  . famotidine  20 mg Oral Daily  . insulin aspart  0-15 Units Subcutaneous TID WC  . mirabegron ER  25 mg Oral Daily  . pantoprazole  40 mg Oral Daily  . PARoxetine  40 mg Oral BID  . potassium chloride  40 mEq Oral Once  . valACYclovir  500 mg Oral BID   Continuous Infusions: . lactated ringers 100 mL/hr at 05/16/20 0758  . potassium chloride      Assessment & Plan:   Principal Problem:   AKI (acute kidney injury) ([place]) Active Problems:   Prediabetes   Essential hypertension   HSV-2 infection   Obesity, Class II, BMI 35-39.9   Non-intractable cyclical vomiting   AKI -Baseline creatinine was normal on 7/13, on admission creatinine is 2.79 -Likely due to prerenal failure secondary to dehydration in the setting of cyclical vomiting Today 1.37 -Continue IV fluids -Monitor a.m.  labs -Avoid nephrotoxic agents including ACEI and NSAIDs  Cyclical vomiting -Patient with prior extensive GI evaluation for this condition -May be related to cannabinoid hyperemesis syndrome, as she is continuing to use -was encourage marijuana cessation and a good bowel regimen as well as behavioral health support -Continue Protonix -Zofran prn  Hypokalemia Today's potassium is 2.9 Will replace with KCl 40 mEq p.o. x1 and 40 mEq IV x1 Check K level Will check magnesium   Depression/anxiety -Continue Xanax, Elavil (was change to qhs dosing), Paxil Admitter had reviewed this patient in the [place] Controlled Substances Reporting System.  She is receiving medications from only one provider and appears to be taking them as prescribed.   HSV-2 -continue Valtrex  HTN -contorlled, will continue with Norvasc. -Clonidine held, may consider alternative therapy since taking it qd, will need to discuss with her pcp about this as outpt.   Prediabetes - A1c is 5.6 Continue with R ISS, check fingersticks   Obesity Body mass index is 37.1 kg/m.  Was encouraged to lose weight on admission  Marijuana dependence -Cessation was encouraged, I doubt patient will stop as she tells me she has been doing this for 30 years       DVT prophylaxis:  Lovenox  Code Status:  Full -  Family Communication: None present Disposition Plan: Home Status is: Observation  The patient remains OBS appropriate and will d/c before 2 midnights.  Dispo: The patient is from: Home              Anticipated d/c is to: Home              Anticipated d/c date is: 1 day              Patient currently is not medically stable to d/c.Still receiving IVF and electrolytes supplementation.             LOS: 0 days   Time spent: 35 min with >50% on coc   Nolberto Hanlon, MD [facility] Pager [phone]  If 7PM-7AM, please contact night-coverage [email or web address] Password [facility] 05/16/2020, 9:48  AM

## 2020-05-17 LAB — BASIC METABOLIC PANEL
Anion gap: 4 — ABNORMAL LOW (ref 5–15)
BUN: 13 mg/dL (ref 6–20)
CO2: 24 mmol/L (ref 22–32)
Calcium: 8.7 mg/dL — ABNORMAL LOW (ref 8.9–10.3)
Chloride: 110 mmol/L (ref 98–111)
Creatinine, Ser: 1.25 mg/dL — ABNORMAL HIGH (ref 0.44–1.00)
GFR calc Af Amer: 58 mL/min — ABNORMAL LOW (ref >60)
GFR calc non Af Amer: 50 mL/min — ABNORMAL LOW (ref >60)
Glucose, Bld: 97 mg/dL (ref 70–99)
Potassium: 3.9 mmol/L (ref 3.5–5.1)
Sodium: 138 mmol/L (ref 135–145)

## 2020-05-17 LAB — GLUCOSE, CAPILLARY
Glucose-Capillary: 98 mg/dL (ref 70–99)
Glucose-Capillary: 94 mg/dL (ref 70–99)
Glucose-Capillary: 97 mg/dL (ref 70–99)
Glucose-Capillary: 128 mg/dL — ABNORMAL HIGH (ref 70–99)

## 2020-05-17 LAB — MAGNESIUM: Magnesium: 2 mg/dL (ref 1.7–2.4)

## 2020-05-17 MED ORDER — ENOXAPARIN SODIUM 40 MG/0.4ML ~~LOC~~ SOLN
40.0000 mg | SUBCUTANEOUS | Status: DC
  Administered 2020-05-17: 40 mg via SUBCUTANEOUS
  Filled 2020-05-17: qty 0.4

## 2020-05-17 NOTE — Plan of Care (Signed)

## 2020-05-17 NOTE — Progress Notes (Signed)
PROGRESS NOTE  Dana Greer PFX:[id number] DOB: 1971-03-19 DOA: 05/15/2020 PCP: Abner Greenspan, MD  HPI/Recap of past 24 hours: Dana Greer Goinsis a 49 y.o.femalewith medical history significant ofCVA; HTN; and fibromyalgia/depression presenting with chest pain.She reports terrible bouts with nausea, prolonged history. She wakes up vomiting and it continues until it stops. This time, she was sweating profusely. She had headache and palpitations. She had some pain from L shoulder to arm. She knows that she does not drink enough fluids. She has had an evaluation for the n/v episodes. She was told that it was not related to cabbaninoid hyperemesis syndrome. She smoked yesterday because she couldn't keep anything down. She has been noticing awakening with abdominal pain and nausea for the last 2 weeks despite normal BMs. She has been changing her diet to try to decrease her sugar due to concern about diabetes - she has been told she was borderline in the past and she worked hard to change that. She has lost maybe 10-12 pounds in the last few weeks, unintentional. Her "doctor", Johny Blamer, a chiropractor, told her not to get the vaccine because her "immune system is very low."  05/17/20: Seen and examined.  Nauseous overnight received IV Zofran.  States she has been dealing with intermittent nausea with vomiting since 2016.  She had an upper endoscopy completed on 02/10/2020 by Dr. Carlean Purl which was normal.  Will obtain a gastric emptying study to further assess.  Assessment/Plan: Principal Problem:   AKI (acute kidney injury) ([place]) Active Problems:   Prediabetes   Essential hypertension   HSV-2 infection   Obesity, Class II, BMI 35-39.9   Non-intractable cyclical vomiting   Cyclical vomiting/intractable nausea -Patient with prior extensive GI evaluation for this condition -May be related to cannabinoid hyperemesis syndrome, as she is continuing to use -Obtain  gastric emptying study to rule out gastroparesis -was encourage marijuana cessation  -Continue Protonix 40 mg p.o. daily -Continue IV antiemetics as needed   AKI, likely prerenal in the setting of dehydration from poor oral intake and cyclical vomiting Baseline creatinine appears to be 1.0 with GFR greater than 60 Presented with creatinine of 2.79 with GFR of 22 Renal function close to baseline, improved with IV fluid hydration Continue to hold all nephrotoxins Continue to hold on ACE inhibitor and NSAIDs Encourage oral hydration and monitor urine output Repeat BMP in the morning  Resolved post repletion: Hypokalemia Serum potassium 3.9 from 2.9 Replaced intravenously and orally. Serum magnesium 2.0  Depression/anxiety Stable -Continue Xanax, Elavil (was change to qhs dosing), Paxil Admitter had reviewed this patient in the [place] Controlled Substances Reporting System.She is receiving medications from only one provider and appears to be taking them as prescribed.  HSV-2 -continue Valtrex  HTN Blood pressure is at goal Avoid hypotension in the setting of recent AKI. Continue home antihypertensive regimen Continue to monitor vital signs  Prediabetes - A1c is 5.6 Avoid hypoglycemia  Obesity Body mass index is 37.1 kg/m. Was encouraged to lose weight on admission Outpatient follow-up with PCP  Marijuana dependence -Cessation was encouraged    DVT prophylaxis:Lovenox subcu daily Code Status:Full -  Family Communication:None present  Status is: Inpatient  The patient remains inpatient appropriate and will d/c after at least 2 midnights.  Dispo: The patient is from: Home  Anticipated d/c is to: Home  Anticipated d/c date is: 05/18/2020  Patient currently is not medically stable to d/c.Still receiving IVF.  Ongoing work-up for intractable nausea.  Objective: Vitals:   05/16/20 0845 05/16/20 1509  05/16/20 1927 05/17/20 0355  BP: 127/85 116/84 (!) 137/97 (!) 142/84  Pulse: 82 73 (!) 58 62  Resp: 17 17 16 16   Temp: 98.8 F (37.1 C) 98.5 F (36.9 C) 99 F (37.2 C) 98.8 F (37.1 C)  TempSrc: Oral Oral Oral Oral  SpO2: 96% 99% 100% 99%  Weight:      Height:        Intake/Output Summary (Last 24 hours) at 05/17/2020 0736 Last data filed at 05/17/2020 0313 Gross per 24 hour  Intake 2135.9 ml  Output --  Net 2135.9 ml   Filed Weights   05/15/20 1033  Weight: 92 kg    Exam:  . General: 49 y.o. year-old female well developed well nourished in no acute distress.  Alert and oriented x3. . Cardiovascular: Regular rate and rhythm with no rubs or gallops.  No thyromegaly or JVD noted.   Marland Kitchen Respiratory: Clear to auscultation with no wheezes or rales. Good inspiratory effort. . Abdomen: Soft nontender nondistended with normal bowel sounds x4 quadrants. . Musculoskeletal: No lower extremity edema. 2/4 pulses in all 4 extremities. Marland Kitchen Psychiatry: Mood is appropriate for condition and setting   Data Reviewed: CBC: Recent Labs  Lab 05/12/20 0947 05/15/20 1044 05/16/20 0829  WBC 12.1* 13.4* 7.0  NEUTROABS 9.3*  --   --   HGB 13.2 16.0* 13.0  HCT 38.6 48.2* 38.9  MCV 97.0 98.8 97.3  PLT 189 181 720   Basic Metabolic Panel: Recent Labs  Lab 05/12/20 0947 05/15/20 1044 05/16/20 0829 05/17/20 0248  NA 139 137 138 138  K 3.7 4.0 2.9* 3.9  CL 106 103 105 110  CO2 24 15* 23 24  GLUCOSE 191* 152* 105* 97  BUN 12 33* 18 13  CREATININE 1.06* 2.79* 1.37* 1.25*  CALCIUM 9.1 10.0 8.8* 8.7*  MG  --   --   --  2.0   GFR: Estimated Creatinine Clearance: 57.5 mL/min (A) (by C-G formula based on SCr of 1.25 mg/dL (H)). Liver Function Tests: Recent Labs  Lab 05/12/20 0947 05/15/20 1344  AST 23 19  ALT 13 15  ALKPHOS 46 43  BILITOT 0.6 0.5  PROT 7.7 6.7  ALBUMIN 4.4 3.6   Recent Labs  Lab 05/15/20 1344  LIPASE 19   No results for input(s): AMMONIA in the last 168  hours. Coagulation Profile: No results for input(s): INR, PROTIME in the last 168 hours. Cardiac Enzymes: No results for input(s): CKTOTAL, CKMB, CKMBINDEX, TROPONINI in the last 168 hours. BNP (last 3 results) No results for input(s): PROBNP in the last 8760 hours. HbA1C: Recent Labs    05/15/20 1702  HGBA1C 5.6   CBG: Recent Labs  Lab 05/16/20 0648 05/16/20 1229 05/16/20 1647 05/16/20 2024 05/17/20 0638  GLUCAP 102* 102* 111* 116* 98   Lipid Profile: No results for input(s): CHOL, HDL, LDLCALC, TRIG, CHOLHDL, LDLDIRECT in the last 72 hours. Thyroid Function Tests: No results for input(s): TSH, T4TOTAL, FREET4, T3FREE, THYROIDAB in the last 72 hours. Anemia Panel: No results for input(s): VITAMINB12, FOLATE, FERRITIN, TIBC, IRON, RETICCTPCT in the last 72 hours. Urine analysis:    Component Value Date/Time   COLORURINE YELLOW 05/15/2020 1208   APPEARANCEUR HAZY (A) 05/15/2020 1208   LABSPEC 1.017 05/15/2020 1208   PHURINE 5.0 05/15/2020 1208   GLUCOSEU 50 (A) 05/15/2020 1208   HGBUR LARGE (A) 05/15/2020 1208   HGBUR moderate 12/10/2007 1434   BILIRUBINUR NEGATIVE 05/15/2020  [place] neg 10/01/2012 1552   KETONESUR 20 (A) 05/15/2020 1208   PROTEINUR 30 (A) 05/15/2020 1208   UROBILINOGEN 1.0 05/21/2015 0445   NITRITE NEGATIVE 05/15/2020 1208   LEUKOCYTESUR NEGATIVE 05/15/2020 1208   Sepsis Labs: @LABRCNTIP (procalcitonin:4,lacticidven:4)  ) Recent Results (from the past 240 hour(s))  SARS Coronavirus 2 by RT PCR (hospital order, performed in [facility] hospital lab) Nasopharyngeal Nasopharyngeal Swab     Status: None   Collection Time: 05/15/20  4:28 PM   Specimen: Nasopharyngeal Swab  Result Value Ref Range Status   SARS Coronavirus 2 NEGATIVE NEGATIVE Final    Comment: (NOTE) SARS-CoV-2 target nucleic acids are NOT DETECTED.  The SARS-CoV-2 RNA is generally detectable in upper and lower respiratory specimens during the acute phase of infection.  The lowest concentration of SARS-CoV-2 viral copies this assay can detect is 250 copies / mL. A negative result does not preclude SARS-CoV-2 infection and should not be used as the sole basis for treatment or other patient management decisions.  A negative result may occur with improper specimen collection / handling, submission of specimen other than nasopharyngeal swab, presence of viral mutation(s) within the areas targeted by this assay, and inadequate number of viral copies (<250 copies / mL). A negative result must be combined with clinical observations, patient history, and epidemiological information.  Fact Sheet for Patients:   StrictlyIdeas.no  Fact Sheet for Healthcare Providers: BankingDealers.co.za  This test is not yet approved or  cleared by the [place] FDA and has been authorized for detection and/or diagnosis of SARS-CoV-2 by FDA under an Emergency Use Authorization (EUA).  This EUA will remain in effect (meaning this test can be used) for the duration of the COVID-19 declaration under Section 564(b)(1) of the Act, 21 U.S.C. section 360bbb-3(b)(1), unless the authorization is terminated or revoked sooner.  Performed at [facility], [place] [address], [place], [place] [address]       Studies: No results found.  Scheduled Meds: . amitriptyline  50 mg Oral QHS  . amLODipine  5 mg Oral Daily  . budesonide  0.25 mg Inhalation Daily  . capsaicin   Topical BID  . enoxaparin (LOVENOX) injection  30 mg Subcutaneous Q24H  . famotidine  20 mg Oral Daily  . insulin aspart  0-15 Units Subcutaneous TID WC  . mirabegron ER  25 mg Oral Daily  . pantoprazole  40 mg Oral Daily  . PARoxetine  40 mg Oral BID  . valACYclovir  500 mg Oral BID    Continuous Infusions: . lactated ringers 100 mL/hr at 05/17/20 0720     LOS: 1 day     Kayleen Memos, MD [facility] Pager [phone]  If 7PM-7AM, please  contact night-coverage [email or web address] Password [facility] 05/17/2020, 7:36 AM

## 2020-05-18 ENCOUNTER — Inpatient Hospital Stay (HOSPITAL_COMMUNITY): Payer: Medicare Other

## 2020-05-18 ENCOUNTER — Telehealth: Payer: Self-pay

## 2020-05-18 LAB — GLUCOSE, CAPILLARY: Glucose-Capillary: 102 mg/dL — ABNORMAL HIGH (ref 70–99)

## 2020-05-18 LAB — BASIC METABOLIC PANEL
Anion gap: 6 (ref 5–15)
BUN: 11 mg/dL (ref 6–20)
CO2: 25 mmol/L (ref 22–32)
Calcium: 8.7 mg/dL — ABNORMAL LOW (ref 8.9–10.3)
Chloride: 108 mmol/L (ref 98–111)
Creatinine, Ser: 1.22 mg/dL — ABNORMAL HIGH (ref 0.44–1.00)
GFR calc Af Amer: >60 mL/min (ref >60)
GFR calc non Af Amer: 52 mL/min — ABNORMAL LOW (ref >60)
Glucose, Bld: 95 mg/dL (ref 70–99)
Potassium: 3.7 mmol/L (ref 3.5–5.1)
Sodium: 139 mmol/L (ref 135–145)

## 2020-05-18 MED ORDER — TECHNETIUM TC 99M SULFUR COLLOID
1.9000 | Freq: Once | INTRAVENOUS | Status: AC | PRN
  Administered 2020-05-18: 1.9 via ORAL

## 2020-05-18 MED ORDER — PANTOPRAZOLE SODIUM 40 MG PO TBEC
40.0000 mg | DELAYED_RELEASE_TABLET | Freq: Every day | ORAL | 0 refills | Status: DC
Start: 2020-05-18 — End: 2021-12-31

## 2020-05-18 MED ORDER — FAMOTIDINE 20 MG PO TABS
20.0000 mg | ORAL_TABLET | Freq: Every day | ORAL | 0 refills | Status: DC
Start: 2020-05-18 — End: 2020-10-28

## 2020-05-18 MED ORDER — CAPSAICIN 0.025 % EX CREA: TOPICAL_CREAM | Freq: Two times a day (BID) | CUTANEOUS | 0 refills | Status: DC

## 2020-05-18 MED ORDER — AMITRIPTYLINE HCL 25 MG PO TABS: 50.0000 mg | ORAL_TABLET | Freq: Every day | ORAL | 0 refills | Status: AC

## 2020-05-18 MED ORDER — PAROXETINE HCL 40 MG PO TABS: 40.0000 mg | ORAL_TABLET | Freq: Two times a day (BID) | ORAL | 0 refills | Status: DC

## 2020-05-18 NOTE — Plan of Care (Signed)

## 2020-05-18 NOTE — Plan of Care (Signed)

## 2020-05-18 NOTE — Progress Notes (Signed)
Discharge package printed and instructions given to patient. Verbalizing understand.

## 2020-05-18 NOTE — Telephone Encounter (Signed)
1st attempt- Called to complete TCM and schedule hospital follow up visit. Patient did not answer. Unable to leave voicemail because mailbox is full.

## 2020-05-18 NOTE — Discharge Summary (Addendum)
Discharge Summary  Dana Greer XIP:[id number] DOB: 1970/12/28  PCP: Abner Greenspan, MD  Admit date: 05/15/2020 Discharge date: 05/18/2020  Time spent: 35 minutes  Recommendations for Outpatient Follow-up:  1. Follow-up with her gastroenterologist 2. Follow-up with your PCP 3. Follow-up with neurology for migraine headaches 4. Take your medications as prescribed  Discharge Diagnoses:  Active Hospital Problems   Diagnosis Date Noted  . AKI (acute kidney injury) ([place]) 05/15/2020  . Obesity, Class II, BMI 35-39.9 05/15/2020  . Non-intractable cyclical vomiting 67/34/1937  . HSV-2 infection 08/17/2016  . Essential hypertension 08/01/2007  . Prediabetes 08/01/2007    Resolved Hospital Problems  No resolved problems to display.    Discharge Condition: Stable  Diet recommendation: Small frequent meals, low-fat diet.  Vitals:   05/18/20 0356 05/18/20 0808  BP: (!) 120/98 (!) 137/99  Pulse: 76 79  Resp: 16 17  Temp: 98.6 F (37 C) 98.6 F (37 C)  SpO2: 99% 98%    History of present illness:  Dana Greer a 49 y.o.femalewith medical history significant ofCVA; HTN; and fibromyalgia/depression presenting with chest pain.She reports terrible bouts with nausea and vomiting, prolonged history. She wakes up vomiting and it continues until it stops. This time, she was sweating profusely. She had headache and palpitations. She had some pain from L shoulder to arm. She knows that she does not drink enough fluids. She has had an evaluation for the n/v episodes. She was told that it was not related to cabbaninoid hyperemesis syndrome. She smoked the day prior to presentation because she couldn't keep anything down. She has lost maybe 10-12 pounds in the last few weeks, unintentional.  Presented with AKI likely prerenal in the setting of dehydration from poor p.o. intake.  Received IV fluid hydration with improvement of her renal insufficiency.  Received IV  antiemetics as needed, diet was advanced and tolerated well.  Had an EGD done on 02/10/2020 by Dr. Carlean Purl, with normal findings.  Also had a colonoscopy on 02/10/2020, pathology revealed: TUBULAR ADENOMA, NEGATIVE FOR HIGH GRADE DYSPLASIA.  A gastric emptying study has been ordered to rule out gastroparesis as a cause of her chronic nausea, results are pending.  05/18/20: Seen and examined.  Her nausea is improved this morning.  She is tolerating a diet.  Advised to follow-up with her gastroenterologist posthospitalization.  Patient understands and agrees to plan.  Reports she needs to follow-up with neurology to continue Botox injection for her migraine headaches.  Could obtain a referral from her PCP or follow-up with Dr. Rob Hickman, neurology, in [place], [place] as an option.  She had a  normal gastric emptying study.   Hospital Course:  Principal Problem:   AKI (acute kidney injury) ([place]) Active Problems:   Prediabetes   Essential hypertension   HSV-2 infection   Obesity, Class II, BMI 35-39.9   Non-intractable cyclical vomiting  Cyclical vomiting/intractable nausea, improved, unclear etiology -Patient with prior extensive GI evaluation for this condition -May be related to cannabinoid hyperemesis syndrome -Last endoscopy unrevealing -Gastric emptying study results pending to rule out gastroparesis, normal gastric emptying study. -Recommend marijuana cessation  -Continue Protonix 40 mg p.o. daily and famotidine 20 mg daily -Continue antiemetics as needed -Follow-up with GI Dr. Carlean Purl  AKI, likely prerenal in the setting of dehydration from poor oral intake and cyclical vomiting Baseline creatinine appears to be 1.0 with GFR greater than 60 Presented with creatinine of 2.79 with GFR of 22 Renal function close to baseline, improved with  IV fluid hydration Creatinine 1.2 with GFR greater than 60 Continue to avoid nephrotoxins and dehydration Continue to hold off ACE inhibitor and  NSAIDs  Resolved post repletion: Hypokalemia Serum potassium 3.9 from 2.9 Replaced intravenously and orally. Serum magnesium 2.0  Depression/anxiety Stable -Continue Xanax, Elavil, Paxil -Follow-up with your PCP  HSV-2 -continue Valtrex  HTN Blood pressure is at goal Continue amlodipine 5 mg daily  Prediabetes - A1cis 5.6 -Follow-up with your PCP  Obesity Body mass index is 37.1 kg/m. Recommend weight loss with regular physical activity and healthy dieting Outpatient follow-up with PCP  Marijuana use disorder Recommend cessation    Code Status:Full code-    Procedures:  Gastric emptying study  Consultations:  None  Discharge Exam: BP (!) 137/99 (BP Location: Left Arm)   Pulse 79   Temp 98.6 F (37 C) (Oral)   Resp 17   Ht 5\' 2"  (1.575 m)   Wt 92 kg   LMP 10/16/2019 (Within Days) Comment: had hysterectomy-still has half of her uterus and ovaries  SpO2 98%   BMI 37.10 kg/m  . General: 49 y.o. year-old female pleasant, well developed well nourished in no acute distress.  Alert and oriented x3. . Cardiovascular: Regular rate and rhythm with no rubs or gallops.  No thyromegaly or JVD noted.   Marland Kitchen Respiratory: Clear to auscultation with no wheezes or rales. Good inspiratory effort. . Abdomen: Soft nontender nondistended with normal bowel sounds x4 quadrants. . Musculoskeletal: No lower extremity edema. 2/4 pulses in all 4 extremities. Marland Kitchen Psychiatry: Mood is appropriate for condition and setting  Discharge Instructions You were cared for by a hospitalist during your hospital stay. If you have any questions about your discharge medications or the care you received while you were in the hospital after you are discharged, you can call the unit and asked to speak with the hospitalist on call if the hospitalist that took care of you is not available. Once you are discharged, your primary care physician will handle any further medical issues. Please  note that NO REFILLS for any discharge medications will be authorized once you are discharged, as it is imperative that you return to your primary care physician (or establish a relationship with a primary care physician if you do not have one) for your aftercare needs so that they can reassess your need for medications and monitor your lab values.   Allergies as of 05/18/2020      Reactions   Trazodone And Nefazodone Nausea And Vomiting   Amoxicillin Other (See Comments)   Yeast infection    Carafate [sucralfate] Nausea And Vomiting      Medication List    STOP taking these medications   cloNIDine 0.2 MG tablet Commonly known as: CATAPRES   HYDROcodone-acetaminophen 5-325 MG tablet Commonly known as: Norco     TAKE these medications   ALPRAZolam 1 MG tablet Commonly known as: XANAX Take 1 mg by mouth 4 (four) times daily as needed for anxiety or sleep.   amitriptyline 25 MG tablet Commonly known as: ELAVIL Take 2 tablets (50 mg total) by mouth daily.   amLODipine 5 MG tablet Commonly known as: NORVASC Take 1 tablet (5 mg total) by mouth daily.   capsaicin 0.025 % cream Commonly known as: ZOSTRIX Apply topically 2 (two) times daily.   famotidine 20 MG tablet Commonly known as: PEPCID Take 1 tablet (20 mg total) by mouth daily. What changed: when to take this   fluticasone 50 MCG/ACT nasal spray  Commonly known as: FLONASE Place 2 sprays into both nostrils daily.   Implanon 68 MG Impl implant Generic drug: etonogestrel Inject 1 each into the skin continuous. Reported on 01/27/2016   Myrbetriq 25 MG Tb24 tablet Generic drug: mirabegron ER TAKE 1 TABLET BY MOUTH ONCE A DAY What changed: how much to take   ondansetron 4 MG disintegrating tablet Commonly known as: ZOFRAN-ODT TAKE 1 TABLET BY MOUTH EVERY 8 HOURS AS NEEDED FOR NAUSEA AND VOMITING What changed: See the new instructions.   pantoprazole 40 MG tablet Commonly known as: PROTONIX Take 1 tablet (40 mg  total) by mouth daily.   PARoxetine 40 MG tablet Commonly known as: PAXIL Take 1 tablet (40 mg total) by mouth 2 (two) times daily.   promethazine 25 MG tablet Commonly known as: PHENERGAN TAKE 1 TABLET BY MOUTH EVERY 8 HOURS AS NEEDED FOR NAUSEA AND VOMITING. What changed: See the new instructions.   Pulmicort Flexhaler 90 MCG/ACT inhaler Generic drug: Budesonide Inhale 1-2 puffs into the lungs daily.   valACYclovir 500 MG tablet Commonly known as: VALTREX TAKE 1 TABLET BY MOUTH ONCE DAILY What changed: when to take this      Allergies  Allergen Reactions  . Trazodone And Nefazodone Nausea And Vomiting  . Amoxicillin Other (See Comments)    Yeast infection   . Carafate [Sucralfate] Nausea And Vomiting    Follow-up Information    Bary Leriche, MD. Call in 1 day(s).   Specialty: Psychiatry Why: Specialty: Neurology Please call for botox injection for migraines. Contact information: [place] [address] [phone]        Abner Greenspan, MD. Call in 1 day(s).   Specialties: Family Medicine, Radiology Why: Please call for a post hospital follow up appointment. Contact information: [place] [address] [phone]        Gatha Mayer, MD. Call in 1 day(s).   Specialty: Gastroenterology Why: Please call for a post hospital follow-up appointment. Contact information: [address] [place] [address] [phone]                The results of significant diagnostics from this hospitalization (including imaging, microbiology, ancillary and laboratory) are listed below for reference.    Significant Diagnostic Studies: DG Chest 2 View  Result Date: 05/15/2020 CLINICAL DATA:  Chest pain additional provided: Chest pain, nausea and vomiting since Wednesday. EXAM: CHEST - 2 VIEW COMPARISON:  Prior chest radiographs 01/21/2020 and earlier FINDINGS: Heart size within normal limits. No appreciable  airspace consolidation. No frank pulmonary edema. No evidence of pleural effusion or pneumothorax. No acute bony abnormality identified. Redemonstrated prominent thoracic dextrocurvature. Thoracic spondylosis. IMPRESSION: No evidence of acute cardiopulmonary abnormality. Redemonstrated prominent thoracic dextrocurvature. Electronically Signed   By: Kellie Simmering DO   On: 05/15/2020 11:36   NM GASTRIC EMPTYING  Result Date: 05/18/2020 CLINICAL DATA:  Nausea and vomiting EXAM: NUCLEAR MEDICINE GASTRIC EMPTYING SCAN TECHNIQUE: After oral ingestion of radiolabeled meal, sequential abdominal images were obtained for 4 hours. Percentage of activity emptying the stomach was calculated at 1 hour, 2 hour, and 3 hours RADIOPHARMACEUTICALS:  1.9 mCi Tc-27m sulfur colloid in standardized meal COMPARISON:  None. FINDINGS: Expected location of the stomach in the left upper quadrant. Ingested meal empties the stomach gradually over the course of the study. 24% emptied at 1 hr ( normal >= 10%) 62% emptied at 2 hr ( normal >= 40%) 100% emptied at 3 hr (  normal >= 70%) IMPRESSION: Normal gastric emptying study. Electronically Signed   By: Inez Catalina M.D.   On: 05/18/2020 14:10    Microbiology: Recent Results (from the past 240 hour(s))  SARS Coronavirus 2 by RT PCR (hospital order, performed in [facility] hospital lab) Nasopharyngeal Nasopharyngeal Swab     Status: None   Collection Time: 05/15/20  4:28 PM   Specimen: Nasopharyngeal Swab  Result Value Ref Range Status   SARS Coronavirus 2 NEGATIVE NEGATIVE Final    Comment: (NOTE) SARS-CoV-2 target nucleic acids are NOT DETECTED.  The SARS-CoV-2 RNA is generally detectable in upper and lower respiratory specimens during the acute phase of infection. The lowest concentration of SARS-CoV-2 viral copies this assay can detect is 250 copies / mL. A negative result does not preclude SARS-CoV-2 infection and should not be used as the sole basis for treatment or  other patient management decisions.  A negative result may occur with improper specimen collection / handling, submission of specimen other than nasopharyngeal swab, presence of viral mutation(s) within the areas targeted by this assay, and inadequate number of viral copies (<250 copies / mL). A negative result must be combined with clinical observations, patient history, and epidemiological information.  Fact Sheet for Patients:   StrictlyIdeas.no  Fact Sheet for Healthcare Providers: BankingDealers.co.za  This test is not yet approved or  cleared by the [place] FDA and has been authorized for detection and/or diagnosis of SARS-CoV-2 by FDA under an Emergency Use Authorization (EUA).  This EUA will remain in effect (meaning this test can be used) for the duration of the COVID-19 declaration under Section 564(b)(1) of the Act, 21 U.S.C. section 360bbb-3(b)(1), unless the authorization is terminated or revoked sooner.  Performed at [facility], [place] [address], [place],  [address]      Labs: Basic Metabolic Panel: Recent Labs  Lab 05/12/20 0947 05/15/20 1044 05/16/20 0829 05/17/20 0248 05/18/20 0131  NA 139 137 138 138 139  K 3.7 4.0 2.9* 3.9 3.7  CL 106 103 105 110 108  CO2 24 15* 23 24 25   GLUCOSE 191* 152* 105* 97 95  BUN 12 33* 18 13 11   CREATININE 1.06* 2.79* 1.37* 1.25* 1.22*  CALCIUM 9.1 10.0 8.8* 8.7* 8.7*  MG  --   --   --  2.0  --    Liver Function Tests: Recent Labs  Lab 05/12/20 0947 05/15/20 1344  AST 23 19  ALT 13 15  ALKPHOS 46 43  BILITOT 0.6 0.5  PROT 7.7 6.7  ALBUMIN 4.4 3.6   Recent Labs  Lab 05/15/20 1344  LIPASE 19   No results for input(s): AMMONIA in the last 168 hours. CBC: Recent Labs  Lab 05/12/20 0947 05/15/20 1044 05/16/20 0829  WBC 12.1* 13.4* 7.0  NEUTROABS 9.3*  --   --   HGB 13.2 16.0* 13.0  HCT 38.6 48.2* 38.9  MCV 97.0 98.8 97.3  PLT 189 181 151    Cardiac Enzymes: No results for input(s): CKTOTAL, CKMB, CKMBINDEX, TROPONINI in the last 168 hours. BNP: BNP (last 3 results) No results for input(s): BNP in the last 8760 hours.  ProBNP (last 3 results) No results for input(s): PROBNP in the last 8760 hours.  CBG: Recent Labs  Lab 05/17/20 0638 05/17/20 1213 05/17/20 1633 05/17/20 1958 05/18/20 0627  GLUCAP 98 94 97 128* 102*       Signed:  Kayleen Memos, MD [facility] 05/18/2020, 2:50 PM

## 2020-05-18 NOTE — Telephone Encounter (Signed)
[place] Night - Client TELEPHONE ADVICE RECORD [facility] Patient Name: Dana Greer Gender: Female DOB: 1971-07-24 Age: 49 Y 70 M 27 D Return Phone Number: [phone] (Primary), [phone] (Alternate) Address: City/State/Zip: [place] [address] Client [place] Night - Client Client Site [place] [name], Roque Lias - MD Contact Type Call Who Is Calling Patient / Member / Family / Caregiver Call Type Triage / Clinical Relationship To Patient Self Return Phone Number [phone] (Primary) Chief Complaint Arm Pain (no known cause) Reason for Call Symptomatic / Request for [place] has left arm pain. Translation No Nurse Assessment Nurse: May, RN, Tammy Date/Time [name] Time): 05/15/2020 5:12:42 AM Confirm and document reason for call. If symptomatic, describe symptoms. ---Caller states her dtr started vomiting at 130am. After vomiting she started having left arm pain. No chest pain. Has the patient had close contact with a person known or suspected to have the novel coronavirus illness OR traveled / lives in area with major community spread (including international travel) in the last 14 days from the onset of symptoms? * If Asymptomatic, screen for exposure and travel within the last 14 days. ---No Does the patient have any new or worsening symptoms? ---Yes Will a triage be completed? ---Yes Related visit to physician within the last 2 weeks? ---No Does the PT have any chronic conditions? (i.e. diabetes, asthma, this includes High risk factors for pregnancy, etc.) ---Yes List chronic conditions. ---htn Is the patient pregnant or possibly pregnant? (Ask all females between the ages of 46-55) ---No Is this a behavioral health or substance abuse call? ---No Guidelines Guideline Title Affirmed Question Affirmed Notes Nurse Date/Time  [name] Time) Arm Pain Difficulty breathing or unusual sweating (e.g., sweating without exertion) May, RN, Tammy 05/15/2020 5:15:05 AM Disp. Time (Eastern Time) Disposition Final UserPLEASE NOTE: All timestamps contained within this report are represented as [place] Standard Time. CONFIDENTIALTY NOTICE: This fax transmission is intended only for the addressee. It contains information that is legally privileged, confidential or otherwise protected from use or disclosure. If you are not the intended recipient, you are strictly prohibited from reviewing, disclosing, copying using or disseminating any of this information or taking any action in reliance on or regarding this information. If you have received this fax in error, please notify us immediately by telephone so that we can arrange for its return to [place]. Phone: [phone], Toll-Free: [phone], Fax: [phone] Page: 2 of 2 Call Id: [id number] 05/15/2020 5:16:48 AM Go to ED Now Yes May, RN, Tammy Caller Disagree/Comply Comply Caller Understands Yes PreDisposition Did not know what to do Care Advice Given Per Guideline GO TO ED NOW: * You need to be seen in the Emergency Department. * Go to the ED at ___________ [place] now. Drive carefully. NOTE TO TRIAGER - DRIVING: * Another adult should drive. * If immediate transportation is not available via car or taxi, then the patient should be instructed to call [id number]. BRING MEDICINES: * Please bring a list of your current medicines when you go to the Emergency Department (ER). CARE ADVICE given per Arm Pain (Adult) guideline. Referrals GO TO FACILITY UNDECIDED

## 2020-05-18 NOTE — Discharge Instructions (Addendum)
Dehydration, Adult Dehydration is condition in which there is not enough water or other fluids in the body. This happens when a person loses more fluids than he or she takes in. Important body parts cannot work right without the right amount of fluids. Any loss of fluids from the body can cause dehydration. Dehydration can be mild, worse, or very bad. It should be treated right away to keep it from getting very bad. What are the causes? This condition may be caused by:  Conditions that cause loss of water or other fluids, such as: ? Watery poop (diarrhea). ? Vomiting. ? Sweating a lot. ? Peeing (urinating) a lot.  Not drinking enough fluids, especially when you: ? Are ill. ? Are doing things that take a lot of energy to do.  Other illnesses and conditions, such as fever or infection.  Certain medicines, such as medicines that take extra fluid out of the body (diuretics).  Lack of safe drinking water.  Not being able to get enough water and food. What increases the risk? The following factors may make you more likely to develop this condition:  Having a long-term (chronic) illness that has not been treated the right way, such as: ? Diabetes. ? Heart disease. ? Kidney disease.  Being 65 years of age or older.  Having a disability.  Living in a place that is high above the ground or sea (high in altitude). The thinner, dried air causes more fluid loss.  Doing exercises that put stress on your body for a long time. What are the signs or symptoms? Symptoms of dehydration depend on how bad it is. Mild or worse dehydration  Thirst.  Dry lips or dry mouth.  Feeling dizzy or light-headed, especially when you stand up from sitting.  Muscle cramps.  Your body making: ? Dark pee (urine). Pee may be the color of tea. ? Less pee than normal. ? Less tears than normal.  Headache. Very bad dehydration  Changes in skin. Skin may: ? Be cold to the touch (clammy). ? Be blotchy  or pale. ? Not go back to normal right after you lightly pinch it and let it go.  Little or no tears, pee, or sweat.  Changes in vital signs, such as: ? Fast breathing. ? Low blood pressure. ? Weak pulse. ? Pulse that is more than 100 beats a minute when you are sitting still.  Other changes, such as: ? Feeling very thirsty. ? Eyes that look hollow (sunken). ? Cold hands and feet. ? Being mixed up (confused). ? Being very tired (lethargic) or having trouble waking from sleep. ? Short-term weight loss. ? Loss of consciousness. How is this treated? Treatment for this condition depends on how bad it is. Treatment should start right away. Do not wait until your condition gets very bad. Very bad dehydration is an emergency. You will need to go to a hospital.  Mild or worse dehydration can be treated at home. You may be asked to: ? Drink more fluids. ? Drink an oral rehydration solution (ORS). This drink helps get the right amounts of fluids and salts and minerals in the blood (electrolytes).  Very bad dehydration can be treated: ? With fluids through an IV tube. ? By getting normal levels of salts and minerals in your blood. This is often done by giving salts and minerals through a tube. The tube is passed through your nose and into your stomach. ? By treating the root cause. Follow these instructions at   home: Oral rehydration solution If told by your doctor, drink an ORS:  Make an ORS. Use instructions on the package.  Start by drinking small amounts, about  cup (120 mL) every 5-10 minutes.  Slowly drink more until you have had the amount that your doctor said to have. Eating and drinking         Drink enough clear fluid to keep your pee pale yellow. If you were told to drink an ORS, finish the ORS first. Then, start slowly drinking other clear fluids. Drink fluids such as: ? Water. Do not drink only water. Doing that can make the salt (sodium) level in your body get too  low. ? Water from ice chips you suck on. ? Fruit juice that you have added water to (diluted). ? Low-calorie sports drinks.  Eat foods that have the right amounts of salts and minerals, such as: ? Bananas. ? Oranges. ? Potatoes. ? Tomatoes. ? Spinach.  Do not drink alcohol.  Avoid: ? Drinks that have a lot of sugar. These include:  High-calorie sports drinks.  Fruit juice that you did not add water to.  Soda.  Caffeine. ? Foods that are greasy or have a lot of fat or sugar. General instructions  Take over-the-counter and prescription medicines only as told by your doctor.  Do not take salt tablets. Doing that can make the salt level in your body get too high.  Return to your normal activities as told by your doctor. Ask your doctor what activities are safe for you.  Keep all follow-up visits as told by your doctor. This is important. Contact a doctor if:  You have pain in your belly (abdomen) and the pain: ? Gets worse. ? Stays in one place.  You have a rash.  You have a stiff neck.  You get angry or annoyed (irritable) more easily than normal.  You are more tired or have a harder time waking than normal.  You feel: ? Weak or dizzy. ? Very thirsty. Get help right away if you have:  Any symptoms of very bad dehydration.  Symptoms of vomiting, such as: ? You cannot eat or drink without vomiting. ? Your vomiting gets worse or does not go away. ? Your vomit has blood or green stuff in it.  Symptoms that get worse with treatment.  A fever.  A very bad headache.  Problems with peeing or pooping (having a bowel movement), such as: ? Watery poop that gets worse or does not go away. ? Blood in your poop (stool). This may cause poop to look black and tarry. ? Not peeing in 6-8 hours. ? Peeing only a small amount of very dark pee in 6-8 hours.  Trouble breathing. These symptoms may be an emergency. Do not wait to see if the symptoms will go away. Get  medical help right away. Call your local emergency services (911 in the [place] Do not drive yourself to the hospital. Summary  Dehydration is a condition in which there is not enough water or other fluids in the body. This happens when a person loses more fluids than he or she takes in.  Treatment for this condition depends on how bad it is. Treatment should be started right away. Do not wait until your condition gets very bad.  Drink enough clear fluid to keep your pee pale yellow. If you were told to drink an oral rehydration solution (ORS), finish the ORS first. Then, start slowly drinking other clear fluids.  Take over-the-counter and prescription medicines only as told by your doctor.  Get help right away if you have any symptoms of very bad dehydration. This information is not intended to replace advice given to you by your health care provider. Make sure you discuss any questions you have with your health care provider. Document Revised: 05/30/2019 Document Reviewed: 05/30/2019 Elsevier Patient Education  [place]. Acute Kidney Injury, Adult  Acute kidney injury is a sudden worsening of kidney function. The kidneys are organs that have several jobs. They filter the blood to remove waste products and extra fluid. They also maintain a healthy balance of minerals and hormones in the body, which helps control blood pressure and keep bones strong. With this condition, your kidneys do not do their jobs as well as they should. This condition ranges from mild to severe. Over time it may develop into long-lasting (chronic) kidney disease. Early detection and treatment may prevent acute kidney injury from developing into a chronic condition. What are the causes? Common causes of this condition include:  A problem with blood flow to the kidneys. This may be caused by: ? Low blood pressure (hypotension) or shock. ? Blood loss. ? Heart and blood vessel (cardiovascular) disease. ? Severe  burns. ? Liver disease.  Direct damage to the kidneys. This may be caused by: ? Certain medicines. ? A kidney infection. ? Poisoning. ? Being around or in contact with toxic substances. ? A surgical wound. ? A hard, direct hit to the kidney area.  A sudden blockage of urine flow. This may be caused by: ? Cancer. ? Kidney stones. ? An enlarged prostate in males. What are the signs or symptoms? Symptoms of this condition may not be obvious until the condition becomes severe. Symptoms of this condition can include:  Tiredness (lethargy), or difficulty staying awake.  Nausea or vomiting.  Swelling (edema) of the face, legs, ankles, or feet.  Problems with urination, such as: ? Abdominal pain, or pain along the side of your stomach (flank). ? Decreased urine production. ? Decrease in the force of urine flow.  Muscle twitches and cramps, especially in the legs.  Confusion or trouble concentrating.  Loss of appetite.  Fever. How is this diagnosed? This condition may be diagnosed with tests, including:  Blood tests.  Urine tests.  Imaging tests.  A test in which a sample of tissue is removed from the kidneys to be examined under a microscope (kidney biopsy). How is this treated? Treatment for this condition depends on the cause and how severe the condition is. In mild cases, treatment may not be needed. The kidneys may heal on their own. In more severe cases, treatment will involve:  Treating the cause of the kidney injury. This may involve changing any medicines you are taking or adjusting your dosage.  Fluids. You may need specialized IV fluids to balance your body's needs.  Having a catheter placed to drain urine and prevent blockages.  Preventing problems from occurring. This may mean avoiding certain medicines or procedures that can cause further injury to the kidneys. In some cases treatment may also require:  A procedure to remove toxic wastes from the body  (dialysis or continuous renal replacement therapy - CRRT).  Surgery. This may be done to repair a torn kidney, or to remove the blockage from the urinary system. Follow these instructions at home: Medicines  Take over-the-counter and prescription medicines only as told by your health care provider.  Do not take any new  medicines without your health care provider's approval. Many medicines can worsen your kidney damage.  Do not take any vitamin and mineral supplements without your health care provider's approval. Many nutritional supplements can worsen your kidney damage. Lifestyle  If your health care provider prescribed changes to your diet, follow them. You may need to decrease the amount of protein you eat.  Achieve and maintain a healthy weight. If you need help with this, ask your health care provider.  Start or continue an exercise plan. Try to exercise at least 30 minutes a day, 5 days a week.  Do not use any tobacco products, such as cigarettes, chewing tobacco, and e-cigarettes. If you need help quitting, ask your health care provider. General instructions  Keep track of your blood pressure. Report changes in your blood pressure as told by your health care provider.  Stay up to date with immunizations. Ask your health care provider which immunizations you need.  Keep all follow-up visits as told by your health care provider. This is important. Where to find more information  American Association of Kidney Patients: BombTimer.gl  National Kidney Foundation: www.kidney.[place]: https://mathis.com/  Life Options Rehabilitation Program: ? www.lifeoptions.org ? www.kidneyschool.org Contact a health care provider if:  Your symptoms get worse.  You develop new symptoms. Get help right away if:  You develop symptoms of worsening kidney disease, which include: ? Headaches. ? Abnormally dark or light skin. ? Easy bruising. ? Frequent hiccups. ? Chest  pain. ? Shortness of breath. ? End of menstruation in women. ? Seizures. ? Confusion or altered mental status. ? Abdominal or back pain. ? Itchiness.  You have a fever.  Your body is producing less urine.  You have pain or bleeding when you urinate. Summary  Acute kidney injury is a sudden worsening of kidney function.  Acute kidney injury can be caused by problems with blood flow to the kidneys, direct damage to the kidneys, and sudden blockage of urine flow.  Symptoms of this condition may not be obvious until it becomes severe. Symptoms may include edema, lethargy, confusion, nausea or vomiting, and problems passing urine.  This condition can usually be diagnosed with blood tests, urine tests, and imaging tests. Sometimes a kidney biopsy is done to diagnose this condition.  Treatment for this condition often involves treating the underlying cause. It is treated with fluids, medicines, dialysis, diet changes, or surgery. This information is not intended to replace advice given to you by your health care provider. Make sure you discuss any questions you have with your health care provider. Document Revised: 09/29/2017 Document Reviewed: 10/07/2016 Elsevier Patient Education  [place].  Nausea, Adult Nausea is feeling sick to your stomach or feeling that you are about to throw up (vomit). Feeling sick to your stomach is usually not serious, but it may be an early sign of a more serious medical problem. As you feel sicker to your stomach, you may throw up. If you throw up, or if you are not able to drink enough fluids, there is a risk that you may lose too much water in your body (get dehydrated). If you lose too much water in your body, you may:  Feel tired.  Feel thirsty.  Have a dry mouth.  Have cracked lips.  Go pee (urinate) less often. Older adults and people who have other diseases or a weak body defense system (immune system) have a higher risk of losing too  much water in the body.  The main goals of treating this condition are:  To relieve your nausea.  To ensure your nausea occurs less often.  To prevent throwing up and losing too much fluid. Follow these instructions at home: Watch your symptoms for any changes. Tell your doctor about them. Follow these instructions as told by your doctor. Eating and drinking      Take an ORS (oral rehydration solution). This is a drink that is sold at pharmacies and stores.  Drink clear fluids in small amounts as you are able. These include: ? Water. ? Ice chips. ? Fruit juice that has water added (diluted fruit juice). ? Low-calorie sports drinks.  Eat bland, easy-to-digest foods in small amounts as you are able, such as: ? Bananas. ? Applesauce. ? Rice. ? Low-fat (lean) meats. ? Toast. ? Crackers.  Avoid drinking fluids that have a lot of sugar or caffeine in them. This includes energy drinks, sports drinks, and soda.  Avoid alcohol.  Avoid spicy or fatty foods. General instructions  Take over-the-counter and prescription medicines only as told by your doctor.  Rest at home while you get better.  Drink enough fluid to keep your pee (urine) pale yellow.  Take slow and deep breaths when you feel sick to your stomach.  Avoid food or things that have strong smells.  Wash your hands often with soap and water. If you cannot use soap and water, use hand sanitizer.  Make sure that all people in your home wash their hands well and often.  Keep all follow-up visits as told by your doctor. This is important. Contact a doctor if:  You feel sicker to your stomach.  You feel sick to your stomach for more than 2 days.  You throw up.  You are not able to drink fluids without throwing up.  You have new symptoms.  You have a fever.  You have a headache.  You have muscle cramps.  You have a rash.  You have pain while peeing.  You feel light-headed or dizzy. Get help right  away if:  You have pain in your chest, neck, arm, or jaw.  You feel very weak or you pass out (faint).  You have throw up that is bright red or looks like coffee grounds.  You have bloody or black poop (stools) or poop that looks like tar.  You have a very bad headache, a stiff neck, or both.  You have very bad pain, cramping, or bloating in your belly (abdomen).  You have trouble breathing or you are breathing very quickly.  Your heart is beating very quickly.  Your skin feels cold and clammy.  You feel confused.  You have signs of losing too much water in your body, such as: ? Dark pee, very little pee, or no pee. ? Cracked lips. ? Dry mouth. ? Sunken eyes. ? Sleepiness. ? Weakness. These symptoms may be an emergency. Do not wait to see if the symptoms will go away. Get medical help right away. Call your local emergency services (911 in the [place] Do not drive yourself to the hospital. Summary  Nausea is feeling sick to your stomach or feeling that you are about to throw up (vomit).  If you throw up, or if you are not able to drink enough fluids, there is a risk that you may lose too much water in your body (get dehydrated).  Eat and drink what your doctor tells you. Take over-the-counter and prescription medicines only as told by your  doctor.  Contact a doctor right away if your symptoms get worse or you have new symptoms.  Keep all follow-up visits as told by your doctor. This is important. This information is not intended to replace advice given to you by your health care provider. Make sure you discuss any questions you have with your health care provider. Document Revised: 03/27/2018 Document Reviewed: 03/27/2018 Elsevier Patient Education  [place].

## 2020-05-19 ENCOUNTER — Ambulatory Visit: Payer: Medicare Other | Admitting: Psychology

## 2020-05-19 NOTE — Telephone Encounter (Signed)
2nd/final attempt- Called to complete TCM and schedule hospital follow up visit. Patient did not answer. Unable to leave voicemail because mailbox is full.  Patient may have to be mailed a correspondence asking her to call and schedule appointment.

## 2020-05-27 DIAGNOSIS — Z23 Encounter for immunization: Secondary | ICD-10-CM | POA: Diagnosis not present

## 2020-06-02 ENCOUNTER — Ambulatory Visit: Payer: Medicare Other | Admitting: Psychology

## 2020-06-16 ENCOUNTER — Encounter: Payer: Self-pay | Admitting: Family Medicine

## 2020-06-16 ENCOUNTER — Ambulatory Visit (INDEPENDENT_AMBULATORY_CARE_PROVIDER_SITE_OTHER): Payer: Medicare Other | Admitting: Psychology

## 2020-06-16 DIAGNOSIS — F331 Major depressive disorder, recurrent, moderate: Secondary | ICD-10-CM

## 2020-06-16 DIAGNOSIS — N898 Other specified noninflammatory disorders of vagina: Secondary | ICD-10-CM | POA: Diagnosis not present

## 2020-06-16 MED ORDER — BACLOFEN 10 MG PO TABS: 5.0000 mg | ORAL_TABLET | Freq: Three times a day (TID) | ORAL | 3 refills | Status: DC | PRN

## 2020-06-16 NOTE — Addendum Note (Signed)
Addended by: Hortencia Pilar on: 06/16/2020 12:48 PM   Modules accepted: Orders

## 2020-06-17 ENCOUNTER — Encounter: Payer: Self-pay | Admitting: Family Medicine

## 2020-06-17 DIAGNOSIS — Z23 Encounter for immunization: Secondary | ICD-10-CM | POA: Diagnosis not present

## 2020-06-17 MED ORDER — CHLORZOXAZONE 500 MG PO TABS: 500.0000 mg | ORAL_TABLET | Freq: Three times a day (TID) | ORAL | 1 refills | Status: DC | PRN

## 2020-06-29 ENCOUNTER — Ambulatory Visit (INDEPENDENT_AMBULATORY_CARE_PROVIDER_SITE_OTHER): Payer: Medicare Other | Admitting: Family Medicine

## 2020-06-29 ENCOUNTER — Encounter: Payer: Self-pay | Admitting: Family Medicine

## 2020-06-29 ENCOUNTER — Other Ambulatory Visit: Payer: Self-pay

## 2020-06-29 VITALS — BP 120/90 | HR 96 | Temp 98.4°F | Ht 62.0 in | Wt 208.5 lb

## 2020-06-29 DIAGNOSIS — M4804 Spinal stenosis, thoracic region: Secondary | ICD-10-CM

## 2020-06-29 DIAGNOSIS — M25552 Pain in left hip: Secondary | ICD-10-CM | POA: Diagnosis not present

## 2020-06-29 DIAGNOSIS — M7062 Trochanteric bursitis, left hip: Secondary | ICD-10-CM

## 2020-06-29 MED ORDER — METHYLPREDNISOLONE ACETATE 40 MG/ML IJ SUSP
80.0000 mg | Freq: Once | INTRAMUSCULAR | Status: AC
  Administered 2020-06-29: 80 mg via INTRA_ARTICULAR

## 2020-06-29 NOTE — Progress Notes (Signed)
Dana Greer T. Kendarius Vigen, MD, [place] at [facility] [place], [address]  Phone: [phone]  FAX: [place] - 49 y.o. female  MRN [id number]  Date of Birth: Dec 02, 1970  Date: 06/29/2020  PCP: Abner Greenspan, MD  Referral: Abner Greenspan, MD  Chief Complaint  Patient presents with  . Hip Pain    Left    This visit occurred during the SARS-CoV-2 public health emergency.  Safety protocols were in place, including screening questions prior to the visit, additional usage of staff PPE, and extensive cleaning of exam room while observing appropriate contact time as indicated for disinfecting solutions.   Subjective:   Dana Greer is a 49 y.o. very pleasant female patient with Body mass index is 38.14 kg/m. who presents with the following:  I saw her last 2 years ago for ongoing right hip pain.  At that point I had her do some home rehab, and gave her a burst of steroids and she did quite well after this.  At that point it was primarily trochanteric bursitis on the right.  Has had several x-rays that said she has normal.    At this point she has developed very significant weakness at the left hip.  She also has some ongoing quite significant back pain, she has been seeing [facility] for this.  This is bothering her basically every day.  Predominantly now she is having some severe lateral hip pain.  Review of Systems is noted in the HPI, as appropriate   Objective:   BP 120/90   Pulse 96   Temp 98.4 F (36.9 C) (Temporal)   Ht 5\' 2"  (1.575 m)   Wt 208 lb 8 oz (94.6 kg)   LMP 10/16/2019 (Within Days) Comment: had hysterectomy-still has half of her uterus and ovaries  SpO2 99%   BMI 38.14 kg/m    GEN: No acute distress; alert,appropriate. PULM: Breathing comfortably in no respiratory distress PSYCH: Normally interactive.   HIP EXAM:  SIDE: Left ROM: Abduction, Flexion, Internal and External range of motion: Full Pain with terminal IROM and EROM: Minimal GTB: Extremely tender to palpation  SLR: Unable to complete  knees: No effusion Piriformis: NT at direct palpation Str: flexion: 3-/5 abduction: 3/5 adduction: 3-/5 Strength testing causes pain    Radiology: No results found.  Assessment and Plan:     ICD-10-CM   1. Trochanteric bursitis of left hip  M70.62   2. Left hip pain  M25.552   3. Thoracic spinal stenosis  M48.04    Exacerbation of trochanteric bursitis with some intermittent ongoing chronic left hip pain and worsening of thoracic spinal stenosis contributes.  I reviewed with the patient the structures involved and how they related to diagnosis.   Patient was given a rehabilitation protocol emphasizing core rehab, partcularly addressing abductor weakness, and stretching of the pelvic musculature, hips, and ITB.  Trochanteric bursa injections have clearly been shown to help with acute bursitis, and the patient would benefit.   Aspiration/Injection Procedure Note Shaneal Barasch [birth year] Date of procedure: 06/29/2020  Procedure: Large Joint Aspiration / Injection of Hip, Trochanteric Bursa, L Indications: Pain  Procedure Details Verbal consent obtained. Risks (including infection, potential atrophy), benefits, and alternatives reviewed. Greater trochanter sterilely prepped with Chloraprep. Ethyl Chloride used for anesthesia. 8 cc of Lidocaine 1% injected with 2 mL of Depo-Medrol 40 mg into  trochanteric bursa at area of maximal tenderness at greater trochanter. Needle taken to bone to troch bursa, flows easily. Bursa massaged. No bleeding and no complications. Decreased pain after injection. Needle: 22 gauge spinal needle Medication: 2 mL of Depo-Medrol 40 mg, equaling Depo-Medrol 80 mg total   Follow-up: No follow-ups on file.  No orders of the defined types were placed in this  encounter.  Medications Discontinued During This Encounter  Medication Reason  . baclofen (LIORESAL) 10 MG tablet Completed Course  . chlorzoxazone (PARAFON FORTE DSC) 500 MG tablet Completed Course  . amLODipine (NORVASC) 5 MG tablet Completed Course   No orders of the defined types were placed in this encounter.   Signed,  Maud Deed. Freedom Lopezperez, MD   Outpatient Encounter Medications as of 06/29/2020  Medication Sig  . ALPRAZolam (XANAX) 1 MG tablet Take 1 mg by mouth 4 (four) times daily as needed for anxiety or sleep.   Marland Kitchen amitriptyline (ELAVIL) 25 MG tablet Take 2 tablets (50 mg total) by mouth daily.  . capsaicin (ZOSTRIX) 0.025 % cream Apply topically 2 (two) times daily.  . cloNIDine (CATAPRES) 0.2 MG tablet Take 0.2 mg by mouth 2 (two) times daily.  Marland Kitchen etonogestrel (IMPLANON) 68 MG IMPL implant Inject 1 each into the skin continuous. Reported on 01/27/2016  . fluticasone (FLONASE) 50 MCG/ACT nasal spray Place 2 sprays into both nostrils daily.  Marland Kitchen gabapentin (NEURONTIN) 600 MG tablet Take by mouth.  Marland Kitchen MYRBETRIQ 25 MG TB24 tablet TAKE 1 TABLET BY MOUTH ONCE A DAY (Patient taking differently: Take 25 mg by mouth daily. )  . ondansetron (ZOFRAN-ODT) 4 MG disintegrating tablet TAKE 1 TABLET BY MOUTH EVERY 8 HOURS AS NEEDED FOR NAUSEA AND VOMITING (Patient taking differently: Take 4 mg by mouth every 8 (eight) hours as needed for nausea or vomiting. )  . promethazine (PHENERGAN) 25 MG tablet TAKE 1 TABLET BY MOUTH EVERY 8 HOURS AS NEEDED FOR NAUSEA AND VOMITING. (Patient taking differently: Take 25 mg by mouth every 8 (eight) hours as needed for nausea or vomiting. )  . PULMICORT FLEXHALER 90 MCG/ACT inhaler Inhale 1-2 puffs into the lungs daily.  Marland Kitchen topiramate (TOPAMAX) 100 MG tablet Take 100 mg by mouth 2 (two) times daily.  . valACYclovir (VALTREX) 500 MG tablet TAKE 1 TABLET BY MOUTH ONCE DAILY (Patient taking differently: Take 500 mg by mouth 2 (two) times daily. )  . famotidine  (PEPCID) 20 MG tablet Take 1 tablet (20 mg total) by mouth daily.  . pantoprazole (PROTONIX) 40 MG tablet Take 1 tablet (40 mg total) by mouth daily.  Marland Kitchen PARoxetine (PAXIL) 40 MG tablet Take 1 tablet (40 mg total) by mouth 2 (two) times daily.  . [DISCONTINUED] amLODipine (NORVASC) 5 MG tablet Take 1 tablet (5 mg total) by mouth daily.  . [DISCONTINUED] baclofen (LIORESAL) 10 MG tablet Take 0.5-1 tablets (5-10 mg total) by mouth 3 (three) times daily as needed for muscle spasms.  . [DISCONTINUED] chlorzoxazone (PARAFON FORTE DSC) 500 MG tablet Take 1 tablet (500 mg total) by mouth 3 (three) times daily as needed for muscle spasms.   No facility-administered encounter medications on file as of 06/29/2020.

## 2020-06-29 NOTE — Addendum Note (Signed)
Addended by: Carter Kitten on: 06/29/2020 09:56 AM   Modules accepted: Orders

## 2020-06-30 ENCOUNTER — Ambulatory Visit (INDEPENDENT_AMBULATORY_CARE_PROVIDER_SITE_OTHER): Payer: Medicare Other | Admitting: Psychology

## 2020-06-30 ENCOUNTER — Other Ambulatory Visit: Payer: Self-pay | Admitting: Family Medicine

## 2020-06-30 DIAGNOSIS — F331 Major depressive disorder, recurrent, moderate: Secondary | ICD-10-CM

## 2020-07-05 ENCOUNTER — Encounter: Payer: Self-pay | Admitting: Family Medicine

## 2020-07-14 ENCOUNTER — Ambulatory Visit: Payer: Medicare Other | Admitting: Psychology

## 2020-07-21 ENCOUNTER — Other Ambulatory Visit: Payer: Self-pay | Admitting: Family Medicine

## 2020-07-21 NOTE — Telephone Encounter (Signed)
Last filled on 05/15/20 #60 with 2 refills, f/u was last done on 3/32/21

## 2020-07-28 ENCOUNTER — Encounter: Payer: Self-pay | Admitting: Podiatry

## 2020-07-28 ENCOUNTER — Ambulatory Visit (INDEPENDENT_AMBULATORY_CARE_PROVIDER_SITE_OTHER): Payer: Medicare Other | Admitting: Podiatry

## 2020-07-28 ENCOUNTER — Other Ambulatory Visit: Payer: Self-pay

## 2020-07-28 ENCOUNTER — Ambulatory Visit (INDEPENDENT_AMBULATORY_CARE_PROVIDER_SITE_OTHER): Payer: Medicare Other | Admitting: Psychology

## 2020-07-28 DIAGNOSIS — E119 Type 2 diabetes mellitus without complications: Secondary | ICD-10-CM | POA: Diagnosis not present

## 2020-07-28 DIAGNOSIS — F331 Major depressive disorder, recurrent, moderate: Secondary | ICD-10-CM

## 2020-07-29 ENCOUNTER — Encounter: Payer: Self-pay | Admitting: Podiatry

## 2020-07-29 NOTE — Progress Notes (Signed)
Subjective: Dana Greer presents today referred by Abner Greenspan, MD for diabetic foot evaluation.  Patient relates greater than 3 year history of diabetes.  Patient denies any history of foot wounds.  Patient denies any history of numbness, tingling, burning, pins/needles sensations.  Past Medical History:  Diagnosis Date  . Arthritis   . Depression   . Fibromyalgia   . Headache(784.0)   . Hx of adenomatous polyp of colon 02/14/2020  . Hypertension   . Immune deficiency disorder ([place])   . Migraine headache   . Shingles   . Stroke ([place])   . Thrombocytopenia ([place]) 10/01/2012    Patient Active Problem List   Diagnosis Date Noted  . AKI (acute kidney injury) ([place]) 05/15/2020  . Obesity, Class II, BMI 35-39.9 05/15/2020  . Non-intractable cyclical vomiting 16/96/7893  . Left-sided back pain 02/25/2020  . Hx of adenomatous polyp of colon 02/14/2020  . Seasonal allergic rhinitis 01/21/2020  . Chronic cough 10/04/2019  . Headache, post-traumatic, acute 01/28/2019  . Head injury, closed 01/17/2019  . Post concussive syndrome 01/17/2019  . Abnormal vaginal bleeding 07/06/2018  . Neck pain 06/25/2018  . Chronic migraine 04/12/2018  . B12 deficiency 01/10/2018  . Foot pain, bilateral 01/09/2018  . Hand paresthesia 01/09/2018  . Vitamin D deficiency 01/09/2018  . Upper abdominal pain 11/22/2016  . Chronic myofascial pain 10/26/2016  . HSV-2 infection 08/17/2016  . Adverse effects of medication 11/25/2015  . Vertigo 10/08/2015  . Carpal tunnel syndrome 03/16/2015  . Gout 12/22/2014  . Vertiginous migraine 02/03/2014  . Intractable migraine 08/31/2012  . Osteoarthritis 05/11/2012  . Female bladder prolapse 05/11/2012  . ADJ DISORDER WITH MIXED ANXIETY & DEPRESSED MOOD 06/30/2008  . HYPERHIDROSIS 01/25/2008  . BACK PAIN, LUMBAR, CHRONIC 10/16/2007  . REACTION, ACUTE STRESS W/EMOTIONAL DSTURB 08/01/2007  . Prediabetes 08/01/2007  . Essential hypertension 08/01/2007   . Goiter 07/11/2007  . POLYCYSTIC OVARIAN DISEASE 05/24/2007  . HIRSUTISM 05/24/2007  . Arthropathy, multiple sites 05/24/2007    Past Surgical History:  Procedure Laterality Date  . ABDOMINAL HYSTERECTOMY    . CARPAL TUNNEL RELEASE Left 09/25/2018   Procedure: LEFT CARPAL TUNNEL RELEASE;  Surgeon: Daryll Brod, MD;  Location: [facility];  Service: Orthopedics;  Laterality: Left;  . CESAREAN SECTION  2008   x1  . PARTIAL HYSTERECTOMY  2012   due to fibroid  . UPPER GASTROINTESTINAL ENDOSCOPY    . [place] EXTRACTION  1995    Current Outpatient Medications on File Prior to Visit  Medication Sig Dispense Refill  . ALPRAZolam (XANAX) 1 MG tablet Take 1 mg by mouth 4 (four) times daily as needed for anxiety or sleep.     Marland Kitchen amitriptyline (ELAVIL) 25 MG tablet Take 2 tablets (50 mg total) by mouth daily. 60 tablet 0  . capsaicin (ZOSTRIX) 0.025 % cream Apply topically 2 (two) times daily. 60 g 0  . cloNIDine (CATAPRES) 0.2 MG tablet Take 0.2 mg by mouth 2 (two) times daily.    Marland Kitchen etonogestrel (IMPLANON) 68 MG IMPL implant Inject 1 each into the skin continuous. Reported on 01/27/2016    . famotidine (PEPCID) 20 MG tablet Take 1 tablet (20 mg total) by mouth daily. 30 tablet 0  . fluticasone (FLONASE) 50 MCG/ACT nasal spray Place 2 sprays into both nostrils daily. 16 g 11  . gabapentin (NEURONTIN) 600 MG tablet Take by mouth.    Marland Kitchen MYRBETRIQ 25 MG TB24 tablet TAKE 1 TABLET BY MOUTH ONCE A  DAY (Patient taking differently: Take 25 mg by mouth daily. ) 30 tablet 5  . ondansetron (ZOFRAN-ODT) 4 MG disintegrating tablet TAKE 1 TABLET BY MOUTH EVERY 8 HOURS AS NEEDED FOR NAUSEA AND VOMITING (Patient taking differently: Take 4 mg by mouth every 8 (eight) hours as needed for nausea or vomiting. ) 30 tablet 3  . pantoprazole (PROTONIX) 40 MG tablet Take 1 tablet (40 mg total) by mouth daily. 30 tablet 0  . PARoxetine (PAXIL) 40 MG tablet Take 1 tablet (40 mg total) by mouth 2 (two)  times daily. 60 tablet 0  . promethazine (PHENERGAN) 25 MG tablet TAKE 1 TABLET BY MOUTH EVERY 8 HOURS AS NEEDED FOR NAUSEA AND VOMITING. (Patient taking differently: Take 25 mg by mouth every 8 (eight) hours as needed for nausea or vomiting. ) 30 tablet 5  . PULMICORT FLEXHALER 90 MCG/ACT inhaler Inhale 1-2 puffs into the lungs daily.    . valACYclovir (VALTREX) 500 MG tablet TAKE 1 TABLET BY MOUTH ONCE DAILY (Patient taking differently: Take 500 mg by mouth 2 (two) times daily. ) 30 tablet 5   No current facility-administered medications on file prior to visit.     Allergies  Allergen Reactions  . Trazodone And Nefazodone Nausea And Vomiting  . Amoxicillin Other (See Comments)    Yeast infection   . Carafate [Sucralfate] Nausea And Vomiting    Social History   Occupational History  . Occupation: Unemployed  Tobacco Use  . Smoking status: Never Smoker  . Smokeless tobacco: Never Used  Vaping Use  . Vaping Use: Never used  Substance and Sexual Activity  . Alcohol use: Yes    Comment: occasional  . Drug use: Yes    Types: Marijuana    Comment: smokes small amount twice daily  . Sexual activity: Yes    Birth control/protection: None    Family History  Problem Relation Age of Onset  . Diabetes Father   . COPD Father   . Healthy Mother   . Breast cancer Paternal Aunt   . Colon cancer Paternal Grandmother   . Rectal cancer Paternal Grandmother   . Lung cancer Paternal Uncle   . Lung cancer Paternal Aunt   . Esophageal cancer Cousin   . Stomach cancer Neg Hx     Immunization History  Administered Date(s) Administered  . Influenza,inj,Quad PF,6+ Mos 11/25/2015, 10/25/2016, 01/09/2018    Review of systems: Positive Findings in bold print.  Constitutional:  chills, fatigue, fever, sweats, weight change Communication: Optometrist, sign Ecologist, hand writing, iPad/Android device Head: headaches, head injury Eyes: changes in vision, eye pain, glaucoma,  cataracts, macular degeneration, diplopia, glare,  light sensitivity, eyeglasses or contacts, blindness Ears nose mouth throat: hearing impaired, hearing aids,  ringing in ears, deaf, sign language,  vertigo, nosebleeds,  rhinitis,  cold sores, snoring, swollen glands Cardiovascular: HTN, edema, arrhythmia, pacemaker in place, defibrillator in place, chest pain/tightness, chronic anticoagulation, blood clot, heart failure, MI Peripheral Vascular: leg cramps, varicose veins, blood clots, lymphedema, varicosities Respiratory:  asthma, difficulty breathing, denies congestion, SOB, wheezing, cough, emphysema Gastrointestinal: change in appetite or weight, abdominal pain, constipation, diarrhea, nausea, vomiting, vomiting blood, change in bowel habits, abdominal pain, jaundice, rectal bleeding, hemorrhoids, GERD Genitourinary:  nocturia,  pain on urination, polyuria,  blood in urine, Foley catheter, urinary urgency, ESRD on hemodialysis Musculoskeletal: amputation, cramping, stiff joints, painful joints, decreased joint motion, fractures, OA, gout, hemiplegia, paraplegia, uses cane, wheelchair bound, uses walker, uses rollator Skin: +changes in toenails, color change,  dryness, itching, mole changes,  rash, wound(s) Neurological: headaches, numbness in feet, paresthesias in feet, burning in feet, fainting,  seizures, change in speech, migraines, memory problems/poor historian, cerebral palsy, weakness, paralysis, CVA, TIA Endocrine: diabetes, hypothyroidism, hyperthyroidism,  goiter, dry mouth, flushing, heat intolerance, cold intolerance,  excessive thirst, denies polyuria,  nocturia Hematological:  easy bleeding, excessive bleeding, easy bruising, enlarged lymph nodes, on long term blood thinner, history of past transusions Allergy/immunological:  hives, eczema, frequent infections, multiple drug allergies, seasonal allergies, transplant recipient, multiple food allergies Psychiatric:  anxiety, depression,  mood disorder, suicidal ideations, hallucinations, insomnia  Objective: There were no vitals filed for this visit. Vascular Examination: Capillary refill time less than 3 seconds x 10 digits.  Dorsalis pedis pulses palpable.  Posterior tibial pulses palpable.  Digital hair not present x 10 digits.  Skin temperature gradient WNL b/l.  Dermatological Examination: Skin with normal turgor, texture and tone b/l  Toenails 1-5 b/l discolored, thick, dystrophic with subungual debris and pain with palpation to nailbeds due to thickness of nails.  Musculoskeletal: Muscle strength 5/5 to all LE muscle groups.  Neurological: Sensation intact with 10 gram monofilament.  Vibratory sensation intact.  Assessment: 1. NIDDM 2. Encounter for diabetic foot examination  Plan: 1. Discussed diabetic foot care principles. Literature dispensed on today. 2. Patient to continue soft, supportive shoe gear daily. 3. Patient to report any pedal injuries to medical professional immediately. 4. Follow up one year. 5. Patient/POA to call should there be a concern in the interim.

## 2020-08-10 ENCOUNTER — Ambulatory Visit: Payer: Medicare Other | Admitting: Family Medicine

## 2020-08-11 ENCOUNTER — Encounter: Payer: Self-pay | Admitting: Family Medicine

## 2020-08-11 ENCOUNTER — Ambulatory Visit: Payer: Medicare Other | Admitting: Psychology

## 2020-08-17 ENCOUNTER — Encounter: Payer: Self-pay | Admitting: Family Medicine

## 2020-08-17 ENCOUNTER — Other Ambulatory Visit: Payer: Self-pay

## 2020-08-17 ENCOUNTER — Ambulatory Visit (INDEPENDENT_AMBULATORY_CARE_PROVIDER_SITE_OTHER): Payer: Medicare Other | Admitting: Family Medicine

## 2020-08-17 VITALS — BP 118/68 | HR 50 | Ht 62.0 in | Wt 214.0 lb

## 2020-08-17 DIAGNOSIS — M4804 Spinal stenosis, thoracic region: Secondary | ICD-10-CM | POA: Diagnosis not present

## 2020-08-17 DIAGNOSIS — M25552 Pain in left hip: Secondary | ICD-10-CM

## 2020-08-17 DIAGNOSIS — M7062 Trochanteric bursitis, left hip: Secondary | ICD-10-CM | POA: Diagnosis not present

## 2020-08-17 NOTE — Progress Notes (Signed)
Dana Greer T. Dana Chatwin, MD, [place] [facility] [place], [address]  Phone: [phone]  FAX: [place] - 49 y.o. female  MRN [id number]  Date of Birth: 11-Nov-1970  Date: 08/17/2020  PCP: Abner Greenspan, MD  Referral: Abner Greenspan, MD  Chief Complaint  Patient presents with  . Left Hip 6 Week Follow-up    Pt states she is doing "awesome"    This visit occurred during the SARS-CoV-2 public health emergency.  Safety protocols were in place, including screening questions prior to the visit, additional usage of staff PPE, and extensive cleaning of exam room while observing appropriate contact time as indicated for disinfecting solutions.   Subjective:   Dana Greer is a 49 y.o. very pleasant female patient with Body mass index is 39.14 kg/m. who presents with the following:  Here for 6 week follow-up.  Last OV I did a GTB injection and reviewed HEP.  At this point she is asymptomatic.  She is not having any hip pain whatsoever and no lateral hip pain.  She has been working on her strength, nutrition, as well as core strength.  She feels really much better.  100%.  06/29/2020 Last OV with Owens Loffler, MD   saw her last 2 years ago for ongoing right hip pain.  At that point I had her do some home rehab, and gave her a burst of steroids and she did quite well after this.  At that point it was primarily trochanteric bursitis on the right.  Has had several x-rays that said she has normal.    At this point she has developed very significant weakness at the left hip.  She also has some ongoing quite significant back pain, she has been seeing [facility] for this.  This is bothering her basically every day.  Predominantly now she is having some severe lateral hip pain.  Review of Systems is noted in the HPI, as appropriate   Objective:     BP 118/68 (BP Location: Left Arm, Patient Position: Sitting, Cuff Size: Large)   Pulse (!) 50   Ht 5\' 2"  (1.575 m)   Wt 214 lb (97.1 kg)   LMP 10/16/2019 (Within Days) Comment: had hysterectomy-still has half of her uterus and ovaries  SpO2 100%   BMI 39.14 kg/m   Left hip: Full range of motion in all directions.  Nontender to palpation throughout the lateral hip as well as the lumbar spine.  Strength is 5/5 in all directions.  Radiology: No results found.  Assessment and Plan:     ICD-10-CM   1. Trochanteric bursitis of left hip  M70.62   2. Thoracic spinal stenosis  M48.04   3. Left hip pain  M25.552    She is doing great.  Continue to work on fitness, strength, range of motion.  I expect that she will do quite well.  She is very motivated.  No orders of the defined types were placed in this encounter.  There are no discontinued medications. No orders of the defined types were placed in this encounter.   Follow-up: No follow-ups on file.  Signed,  Maud Deed. Clariece Roesler, MD   Outpatient Encounter Medications as of 08/17/2020  Medication Sig  . ALPRAZolam (XANAX) 1 MG tablet Take 1 mg by mouth 4 (four) times daily as needed for anxiety or sleep.   Marland Kitchen  amitriptyline (ELAVIL) 25 MG tablet Take 2 tablets (50 mg total) by mouth daily.  . capsaicin (ZOSTRIX) 0.025 % cream Apply topically 2 (two) times daily.  . cloNIDine (CATAPRES) 0.2 MG tablet Take 0.2 mg by mouth 2 (two) times daily.  Marland Kitchen etonogestrel (IMPLANON) 68 MG IMPL implant Inject 1 each into the skin continuous. Reported on 01/27/2016  . fluticasone (FLONASE) 50 MCG/ACT nasal spray Place 2 sprays into both nostrils daily.  Marland Kitchen gabapentin (NEURONTIN) 600 MG tablet Take by mouth.  Marland Kitchen MYRBETRIQ 25 MG TB24 tablet TAKE 1 TABLET BY MOUTH ONCE A DAY (Patient taking differently: Take 25 mg by mouth daily. )  . ondansetron (ZOFRAN-ODT) 4 MG disintegrating tablet TAKE 1 TABLET BY MOUTH EVERY 8 HOURS AS NEEDED FOR NAUSEA AND  VOMITING (Patient taking differently: Take 4 mg by mouth every 8 (eight) hours as needed for nausea or vomiting. )  . promethazine (PHENERGAN) 25 MG tablet TAKE 1 TABLET BY MOUTH EVERY 8 HOURS AS NEEDED FOR NAUSEA AND VOMITING. (Patient taking differently: Take 25 mg by mouth every 8 (eight) hours as needed for nausea or vomiting. )  . PULMICORT FLEXHALER 90 MCG/ACT inhaler Inhale 1-2 puffs into the lungs daily.  . valACYclovir (VALTREX) 500 MG tablet TAKE 1 TABLET BY MOUTH ONCE DAILY (Patient taking differently: Take 500 mg by mouth 2 (two) times daily. )  . famotidine (PEPCID) 20 MG tablet Take 1 tablet (20 mg total) by mouth daily.  . pantoprazole (PROTONIX) 40 MG tablet Take 1 tablet (40 mg total) by mouth daily.  Marland Kitchen PARoxetine (PAXIL) 40 MG tablet Take 1 tablet (40 mg total) by mouth 2 (two) times daily.   No facility-administered encounter medications on file as of 08/17/2020.

## 2020-08-18 ENCOUNTER — Encounter: Payer: Self-pay | Admitting: Family Medicine

## 2020-08-18 ENCOUNTER — Ambulatory Visit (INDEPENDENT_AMBULATORY_CARE_PROVIDER_SITE_OTHER): Payer: Medicare Other | Admitting: Family Medicine

## 2020-08-18 VITALS — BP 126/76 | HR 78 | Temp 97.3°F | Ht 62.0 in | Wt 218.3 lb

## 2020-08-18 DIAGNOSIS — G43109 Migraine with aura, not intractable, without status migrainosus: Secondary | ICD-10-CM

## 2020-08-18 DIAGNOSIS — M26609 Unspecified temporomandibular joint disorder, unspecified side: Secondary | ICD-10-CM | POA: Diagnosis not present

## 2020-08-18 MED ORDER — PREDNISONE 10 MG PO TABS: ORAL_TABLET | ORAL | 0 refills | Status: DC

## 2020-08-18 MED ORDER — TIZANIDINE HCL 4 MG PO TABS
4.0000 mg | ORAL_TABLET | Freq: Four times a day (QID) | ORAL | 1 refills | Status: DC | PRN
Start: 2020-08-18 — End: 2020-10-28

## 2020-08-18 NOTE — Progress Notes (Signed)
Subjective:    Patient ID: Dana Greer, female    DOB: July 26, 1971, 49 y.o.   MRN: [id number]  This visit occurred during the SARS-CoV-2 public health emergency.  Safety protocols were in place, including screening questions prior to the visit, additional usage of staff PPE, and extensive cleaning of exam room while observing appropriate contact time as indicated for disinfecting solutions.    HPI Pt presents with worsening migraine   Wt Readings from Last 3 Encounters:  08/18/20 218 lb 5 oz (99 kg)  08/17/20 214 lb (97.1 kg)  06/29/20 208 lb 8 oz (94.6 kg)   39.93 kg/m  Today 8/10 headache  TM joints are source of the pain  Some fleeting pains /sharp Some throbbing  Worse with exertion   Nauseated a lot  Has zofran and phenergan  Staying very well hydrated with water  Avoids nsaids   Unsure if prednisone has helped in the past  In the past flexeril helped -- ins stopped covering it    Headaches and vertigo  Now has pain when she opens mouth   - ? TMJ   Her vertigo /migraines and nausea do go together in spells  Hard to stand up straight or walk when that happens as well   Thinks a memory foam topper for bed is helpful   A doctor in the hospital recommended a practice in [place] (unsure if she needs a referral)  She does not have an appt set up yet   [place] - dots in vision    Triggers  In the past-trauma /head injury Grinding jaw  Stress    Saw chiropractor today- helps some  Gets in jacuzzi     Hormonal status -has implnaon 68  Sleep     Amitriptyline  Gabapentin   Stopped amlodipine -watching her bp  Clonidine twice a week    botox   Past tx topamax  Has nausea that may o rmay not be related zofran  Phenergan   Saw Dr Einar Pheasant in [place] a neurology referral   BP Readings from Last 3 Encounters:  08/18/20 126/76  08/17/20 118/68  06/29/20 120/90   Pulse Readings from Last 3 Encounters:  08/18/20 78  08/17/20 (!) 50    06/29/20 96   Patient Active Problem List   Diagnosis Date Noted  . TMJ (temporomandibular joint syndrome) 08/18/2020  . AKI (acute kidney injury) ([place]) 05/15/2020  . Obesity, Class II, BMI 35-39.9 05/15/2020  . Non-intractable cyclical vomiting 64/68/0321  . Left-sided back pain 02/25/2020  . Hx of adenomatous polyp of colon 02/14/2020  . Seasonal allergic rhinitis 01/21/2020  . Chronic cough 10/04/2019  . Headache, post-traumatic, acute 01/28/2019  . Head injury, closed 01/17/2019  . Post concussive syndrome 01/17/2019  . Abnormal vaginal bleeding 07/06/2018  . Neck pain 06/25/2018  . Chronic migraine 04/12/2018  . B12 deficiency 01/10/2018  . Foot pain, bilateral 01/09/2018  . Hand paresthesia 01/09/2018  . Vitamin D deficiency 01/09/2018  . Upper abdominal pain 11/22/2016  . Chronic myofascial pain 10/26/2016  . HSV-2 infection 08/17/2016  . Adverse effects of medication 11/25/2015  . Vertigo 10/08/2015  . Carpal tunnel syndrome 03/16/2015  . Gout 12/22/2014  . Vertiginous migraine 02/03/2014  . Intractable migraine 08/31/2012  . Osteoarthritis 05/11/2012  . Female bladder prolapse 05/11/2012  . ADJ DISORDER WITH MIXED ANXIETY & DEPRESSED MOOD 06/30/2008  . HYPERHIDROSIS 01/25/2008  . BACK PAIN, LUMBAR, CHRONIC 10/16/2007  . REACTION, ACUTE STRESS W/EMOTIONAL DSTURB 08/01/2007  .  Prediabetes 08/01/2007  . Essential hypertension 08/01/2007  . Goiter 07/11/2007  . POLYCYSTIC OVARIAN DISEASE 05/24/2007  . HIRSUTISM 05/24/2007  . Arthropathy, multiple sites 05/24/2007   Past Medical History:  Diagnosis Date  . Arthritis   . Depression   . Fibromyalgia   . Headache(784.0)   . Hx of adenomatous polyp of colon 02/14/2020  . Hypertension   . Immune deficiency disorder ([place])   . Migraine headache   . Shingles   . Stroke ([place])   . Thrombocytopenia ([place]) 10/01/2012   Past Surgical History:  Procedure Laterality Date  . ABDOMINAL HYSTERECTOMY    . CARPAL TUNNEL  RELEASE Left 09/25/2018   Procedure: LEFT CARPAL TUNNEL RELEASE;  Surgeon: Daryll Brod, MD;  Location: [facility];  Service: Orthopedics;  Laterality: Left;  . CESAREAN SECTION  2008   x1  . PARTIAL HYSTERECTOMY  2012   due to fibroid  . UPPER GASTROINTESTINAL ENDOSCOPY    . WISDOM TOOTH EXTRACTION  1995   Social History   Tobacco Use  . Smoking status: Never Smoker  . Smokeless tobacco: Never Used  Vaping Use  . Vaping Use: Never used  Substance Use Topics  . Alcohol use: Yes    Comment: occasional  . Drug use: Yes    Types: Marijuana    Comment: smokes small amount twice daily   Family History  Problem Relation Age of Onset  . Diabetes Father   . COPD Father   . Healthy Mother   . Breast cancer Paternal Aunt   . Colon cancer Paternal Grandmother   . Rectal cancer Paternal Grandmother   . Lung cancer Paternal Uncle   . Lung cancer Paternal Aunt   . Esophageal cancer Cousin   . Stomach cancer Neg Hx    Allergies  Allergen Reactions  . Trazodone And Nefazodone Nausea And Vomiting  . Amoxicillin Other (See Comments)    Yeast infection   . Carafate [Sucralfate] Nausea And Vomiting   Current Outpatient Medications on File Prior to Visit  Medication Sig Dispense Refill  . ALPRAZolam (XANAX) 1 MG tablet Take 1 mg by mouth 4 (four) times daily as needed for anxiety or sleep.     Marland Kitchen amitriptyline (ELAVIL) 25 MG tablet Take 2 tablets (50 mg total) by mouth daily. 60 tablet 0  . capsaicin (ZOSTRIX) 0.025 % cream Apply topically 2 (two) times daily. 60 g 0  . cloNIDine (CATAPRES) 0.2 MG tablet Take 0.2 mg by mouth 2 (two) times daily.    Marland Kitchen etonogestrel (IMPLANON) 68 MG IMPL implant Inject 1 each into the skin continuous. Reported on 01/27/2016    . fluticasone (FLONASE) 50 MCG/ACT nasal spray Place 2 sprays into both nostrils daily. 16 g 11  . gabapentin (NEURONTIN) 600 MG tablet Take by mouth.    Marland Kitchen MYRBETRIQ 25 MG TB24 tablet TAKE 1 TABLET BY MOUTH ONCE A DAY  (Patient taking differently: Take 25 mg by mouth daily. ) 30 tablet 5  . ondansetron (ZOFRAN-ODT) 4 MG disintegrating tablet TAKE 1 TABLET BY MOUTH EVERY 8 HOURS AS NEEDED FOR NAUSEA AND VOMITING (Patient taking differently: Take 4 mg by mouth every 8 (eight) hours as needed for nausea or vomiting. ) 30 tablet 3  . promethazine (PHENERGAN) 25 MG tablet TAKE 1 TABLET BY MOUTH EVERY 8 HOURS AS NEEDED FOR NAUSEA AND VOMITING. (Patient taking differently: Take 25 mg by mouth every 8 (eight) hours as needed for nausea or vomiting. ) 30 tablet 5  .  PULMICORT FLEXHALER 90 MCG/ACT inhaler Inhale 1-2 puffs into the lungs daily.    . valACYclovir (VALTREX) 500 MG tablet TAKE 1 TABLET BY MOUTH ONCE DAILY (Patient taking differently: Take 500 mg by mouth 2 (two) times daily. ) 30 tablet 5  . famotidine (PEPCID) 20 MG tablet Take 1 tablet (20 mg total) by mouth daily. 30 tablet 0  . pantoprazole (PROTONIX) 40 MG tablet Take 1 tablet (40 mg total) by mouth daily. 30 tablet 0  . PARoxetine (PAXIL) 40 MG tablet Take 1 tablet (40 mg total) by mouth 2 (two) times daily. 60 tablet 0   No current facility-administered medications on file prior to visit.     Review of Systems  Constitutional: Negative for activity change, appetite change, fatigue, fever and unexpected weight change.  HENT: Negative for congestion, ear pain, rhinorrhea, sinus pressure and sore throat.        Jaw clenching  Eyes: Negative for pain, redness and visual disturbance.  Respiratory: Negative for cough, shortness of breath and wheezing.   Cardiovascular: Negative for chest pain and palpitations.  Gastrointestinal: Negative for abdominal pain, blood in stool, constipation and diarrhea.  Endocrine: Negative for polydipsia and polyuria.  Genitourinary: Negative for dysuria, frequency and urgency.  Musculoskeletal: Negative for arthralgias, back pain and myalgias.       Pain in jaw joints -hurts to open mouth   Skin: Negative for pallor and  rash.  Allergic/Immunologic: Negative for environmental allergies.  Neurological: Positive for dizziness and headaches. Negative for seizures, syncope, facial asymmetry and numbness.  Hematological: Negative for adenopathy. Does not bruise/bleed easily.  Psychiatric/Behavioral: Negative for decreased concentration and dysphoric mood. The patient is not nervous/anxious.        Objective:   Physical Exam Constitutional:      General: She is not in acute distress.    Appearance: Normal appearance. She is obese. She is not ill-appearing or diaphoretic.  HENT:     Head: Normocephalic and atraumatic.     Mouth/Throat:     Mouth: Mucous membranes are moist.     Comments: Tender TM joints bilaterally  No clicking or dislocation  Eyes:     General: No scleral icterus.       Right eye: No discharge.        Left eye: No discharge.     Conjunctiva/sclera: Conjunctivae normal.     Pupils: Pupils are equal, round, and reactive to light.  Neck:     Vascular: No carotid bruit.  Cardiovascular:     Rate and Rhythm: Normal rate and regular rhythm.     Heart sounds: Normal heart sounds.  Pulmonary:     Effort: Pulmonary effort is normal. No respiratory distress.     Breath sounds: Normal breath sounds. No wheezing.  Musculoskeletal:     Cervical back: Normal range of motion and neck supple. No tenderness.  Skin:    General: Skin is warm and dry.     Coloration: Skin is not pale.     Findings: No erythema or rash.  Neurological:     Mental Status: She is alert.     Cranial Nerves: No cranial nerve deficit.     Coordination: Coordination normal.     Gait: Gait normal.  Psychiatric:        Mood and Affect: Mood normal.           Assessment & Plan:   Problem List Items Addressed This Visit      Musculoskeletal and Integument  TMJ (temporomandibular joint syndrome)    Likely from clenching jaw  She plans to see her dentist Adv soft foods/straw/no gum Handout given Adv ice/heat   Tizanidine for this and neck spasm  This is no doubt triggering her headaches         Other   Vertiginous migraine - Primary    Records/hx reviewed regarding hx of headaches and dizziness /nausea  Prev saw Dr Joretta Bachelor who moved Needs a new neuro practice to resume botox and disc other opt for tx She plans to call the neuro office in [place] that was recommended to her and let us know if she needs a referral  Good habits- hydration  Px tizanadine for her jaw pain and neck pain which trigger headaches  Disc management of triggers  Reassuring exam  Will update

## 2020-08-18 NOTE — Patient Instructions (Addendum)
You can check in with a dentist about TMJ  Do not chew gum  Eat soft foods and use a straw until things calm down  Heat /ice may help   I sent in  Prednisone to help stop the headache cycle Tizanidine - muscle relaxer for jaw and neck / may help headache also   Make your appt with the doctors in [place]  Let us know if you if you need a referral

## 2020-08-18 NOTE — Assessment & Plan Note (Signed)
Likely from clenching jaw  She plans to see her dentist Adv soft foods/straw/no gum Handout given Adv ice/heat  Tizanidine for this and neck spasm  This is no doubt triggering her headaches

## 2020-08-18 NOTE — Assessment & Plan Note (Signed)
Records/hx reviewed regarding hx of headaches and dizziness /nausea  Prev saw Dr Joretta Bachelor who moved Needs a new neuro practice to resume botox and disc other opt for tx She plans to call the neuro office in [place] that was recommended to her and let us know if she needs a referral  Good habits- hydration  Px tizanadine for her jaw pain and neck pain which trigger headaches  Disc management of triggers  Reassuring exam  Will update

## 2020-08-24 ENCOUNTER — Other Ambulatory Visit: Payer: Self-pay | Admitting: Family Medicine

## 2020-08-25 ENCOUNTER — Ambulatory Visit (INDEPENDENT_AMBULATORY_CARE_PROVIDER_SITE_OTHER): Payer: Medicare Other | Admitting: Psychology

## 2020-08-25 DIAGNOSIS — F331 Major depressive disorder, recurrent, moderate: Secondary | ICD-10-CM | POA: Diagnosis not present

## 2020-08-25 NOTE — Telephone Encounter (Signed)
I know the guidelines have changed will route to PCP to make sure once daily Rx is still okay

## 2020-09-04 ENCOUNTER — Other Ambulatory Visit: Payer: Self-pay | Admitting: Family Medicine

## 2020-09-04 ENCOUNTER — Encounter: Payer: Self-pay | Admitting: Family Medicine

## 2020-09-04 MED ORDER — SCOPOLAMINE 1 MG/3DAYS TD PT72: 1.0000 | MEDICATED_PATCH | TRANSDERMAL | 1 refills | Status: DC

## 2020-09-04 NOTE — Telephone Encounter (Signed)
Both filled over a year ago, please see pt's mychart message requesting these Rxs

## 2020-09-22 ENCOUNTER — Ambulatory Visit: Payer: Medicare Other | Admitting: Psychology

## 2020-09-29 ENCOUNTER — Encounter: Payer: Self-pay | Admitting: Family Medicine

## 2020-10-14 ENCOUNTER — Ambulatory Visit (INDEPENDENT_AMBULATORY_CARE_PROVIDER_SITE_OTHER)
Admission: RE | Admit: 2020-10-14 | Discharge: 2020-10-14 | Disposition: A | Discharge status: Home / Self Care | Payer: Medicare Other | Source: Ambulatory Visit | Attending: Family Medicine | Admitting: Family Medicine

## 2020-10-14 ENCOUNTER — Other Ambulatory Visit: Payer: Self-pay

## 2020-10-14 ENCOUNTER — Ambulatory Visit (INDEPENDENT_AMBULATORY_CARE_PROVIDER_SITE_OTHER): Payer: Medicare Other | Admitting: Family Medicine

## 2020-10-14 ENCOUNTER — Encounter: Payer: Self-pay | Admitting: Family Medicine

## 2020-10-14 DIAGNOSIS — R1010 Upper abdominal pain, unspecified: Secondary | ICD-10-CM

## 2020-10-14 DIAGNOSIS — M19042 Primary osteoarthritis, left hand: Secondary | ICD-10-CM | POA: Diagnosis not present

## 2020-10-14 DIAGNOSIS — M79642 Pain in left hand: Secondary | ICD-10-CM | POA: Insufficient documentation

## 2020-10-14 DIAGNOSIS — M25442 Effusion, left hand: Secondary | ICD-10-CM

## 2020-10-14 DIAGNOSIS — M7989 Other specified soft tissue disorders: Secondary | ICD-10-CM | POA: Diagnosis not present

## 2020-10-14 NOTE — Patient Instructions (Addendum)
Xray of hand today   Cool or warm water or compress if helpful  Tylenol ok if helpful   Labs also for cbc and uric acid   Will make a plan based on results

## 2020-10-14 NOTE — Assessment & Plan Note (Signed)
Around 2nd mcp primarily with mild swelling but no erythema or warmth Quite tender (gout is in the differential)  Xray now  Uric acid and cbc and bmet  Tylenol for pain  Avoids nsaids for renal health  inst to update if worse and plan to follow results

## 2020-10-14 NOTE — Progress Notes (Signed)
Subjective:    Patient ID: Dana Greer, female    DOB: [birth year], 49 y.o.   MRN: [id number]  This visit occurred during the SARS-CoV-2 public health emergency.  Safety protocols were in place, including screening questions prior to the visit, additional usage of staff PPE, and extensive cleaning of exam room while observing appropriate contact time as indicated for disinfecting solutions.    HPI Pt presents with joint problem of hands   Wt Readings from Last 3 Encounters:  10/14/20 204 lb 9 oz (92.8 kg)  08/18/20 218 lb 5 oz (99 kg)  08/17/20 214 lb (97.1 kg)   37.41 kg/m  Pain in left hand - worse this week  No trauma/no new activity  Fingers hurt  Having a hard time pinching/open and closing jars  Some swelling  No warmth or redness   Worse dorsal hand above her index finger  Distal fingers are ok  Pain radiates to arm  No tenderness in the wrist   Has a h/o gout in the past   No rash any where   No topicals   BP Readings from Last 3 Encounters:  10/14/20 130/78  08/18/20 126/76  08/17/20 118/68   Avoids nsaids for kidney health   Patient Active Problem List   Diagnosis Date Noted  . Hand pain, left 10/14/2020  . Swelling of hand joint, left 10/14/2020  . TMJ (temporomandibular joint syndrome) 08/18/2020  . AKI (acute kidney injury) ([place]) 05/15/2020  . Obesity, Class II, BMI 35-39.9 05/15/2020  . Non-intractable cyclical vomiting 44/12/4740  . Left-sided back pain 02/25/2020  . Hx of adenomatous polyp of colon 02/14/2020  . Seasonal allergic rhinitis 01/21/2020  . Chronic cough 10/04/2019  . Headache, post-traumatic, acute 01/28/2019  . Head injury, closed 01/17/2019  . Post concussive syndrome 01/17/2019  . Abnormal vaginal bleeding 07/06/2018  . Neck pain 06/25/2018  . Chronic migraine 04/12/2018  . B12 deficiency 01/10/2018  . Foot pain, bilateral 01/09/2018  . Hand paresthesia 01/09/2018  . Vitamin D deficiency 01/09/2018  . Upper  abdominal pain 11/22/2016  . Chronic myofascial pain 10/26/2016  . HSV-2 infection 08/17/2016  . Adverse effects of medication 11/25/2015  . Vertigo 10/08/2015  . Carpal tunnel syndrome 03/16/2015  . Gout 12/22/2014  . Vertiginous migraine 02/03/2014  . Intractable migraine 08/31/2012  . Osteoarthritis 05/11/2012  . Female bladder prolapse 05/11/2012  . ADJ DISORDER WITH MIXED ANXIETY & DEPRESSED MOOD 06/30/2008  . HYPERHIDROSIS 01/25/2008  . BACK PAIN, LUMBAR, CHRONIC 10/16/2007  . REACTION, ACUTE STRESS W/EMOTIONAL DSTURB 08/01/2007  . Prediabetes 08/01/2007  . Essential hypertension 08/01/2007  . Goiter 07/11/2007  . POLYCYSTIC OVARIAN DISEASE 05/24/2007  . HIRSUTISM 05/24/2007  . Arthropathy, multiple sites 05/24/2007   Past Medical History:  Diagnosis Date  . Arthritis   . Depression   . Fibromyalgia   . Headache(784.0)   . Hx of adenomatous polyp of colon 02/14/2020  . Hypertension   . Immune deficiency disorder ([place])   . Migraine headache   . Shingles   . Stroke ([place])   . Thrombocytopenia ([place]) 10/01/2012   Past Surgical History:  Procedure Laterality Date  . ABDOMINAL HYSTERECTOMY    . CARPAL TUNNEL RELEASE Left 09/25/2018   Procedure: LEFT CARPAL TUNNEL RELEASE;  Surgeon: Daryll Brod, MD;  Location: [facility];  Service: Orthopedics;  Laterality: Left;  . CESAREAN SECTION  2008   x1  . PARTIAL HYSTERECTOMY  2012   due to fibroid  . UPPER  GASTROINTESTINAL ENDOSCOPY    . WISDOM TOOTH EXTRACTION  1995   Social History   Tobacco Use  . Smoking status: Never Smoker  . Smokeless tobacco: Never Used  Vaping Use  . Vaping Use: Never used  Substance Use Topics  . Alcohol use: Yes    Comment: occasional  . Drug use: Yes    Types: Marijuana    Comment: smokes small amount twice daily   Family History  Problem Relation Age of Onset  . Diabetes Father   . COPD Father   . Healthy Mother   . Breast cancer Paternal Aunt   . Colon cancer  Paternal Grandmother   . Rectal cancer Paternal Grandmother   . Lung cancer Paternal Uncle   . Lung cancer Paternal Aunt   . Esophageal cancer Cousin   . Stomach cancer Neg Hx    Allergies  Allergen Reactions  . Trazodone And Nefazodone Nausea And Vomiting  . Amoxicillin Other (See Comments)    Yeast infection   . Carafate [Sucralfate] Nausea And Vomiting   Current Outpatient Medications on File Prior to Visit  Medication Sig Dispense Refill  . ALPRAZolam (XANAX) 1 MG tablet Take 1 mg by mouth 4 (four) times daily as needed for anxiety or sleep.     Marland Kitchen amitriptyline (ELAVIL) 25 MG tablet Take 2 tablets (50 mg total) by mouth daily. 60 tablet 0  . capsaicin (ZOSTRIX) 0.025 % cream Apply topically 2 (two) times daily. 60 g 0  . cloNIDine (CATAPRES) 0.2 MG tablet Take 0.2 mg by mouth 2 (two) times daily.    Marland Kitchen etonogestrel (NEXPLANON) 68 MG IMPL implant Inject 1 each into the skin continuous. Reported on 01/27/2016    . fluticasone (FLONASE) 50 MCG/ACT nasal spray Place 2 sprays into both nostrils daily. 16 g 11  . gabapentin (NEURONTIN) 600 MG tablet Take by mouth.    Marland Kitchen MYRBETRIQ 25 MG TB24 tablet TAKE 1 TABLET BY MOUTH ONCE A DAY (Patient taking differently: Take 25 mg by mouth daily.) 30 tablet 5  . ondansetron (ZOFRAN-ODT) 4 MG disintegrating tablet TAKE 1 TABLET BY MOUTH EVERY 8 HOURS AS NEEDED FOR NAUSEA AND VOMITING 30 tablet 1  . promethazine (PHENERGAN) 25 MG tablet TAKE 1 TABLET BY MOUTH EVERY 8 HOURS AS NEEDED FOR NAUSEA AND VOMITING 30 tablet 1  . PULMICORT FLEXHALER 90 MCG/ACT inhaler Inhale 1-2 puffs into the lungs daily.    Marland Kitchen scopolamine (TRANSDERM-SCOP, 1.5 MG,) 1 MG/3DAYS Place 1 patch (1.5 mg total) onto the skin every 3 (three) days. 5 patch 1  . tiZANidine (ZANAFLEX) 4 MG tablet Take 1 tablet (4 mg total) by mouth every 6 (six) hours as needed for muscle spasms (jaw pain/headache). 30 tablet 1  . valACYclovir (VALTREX) 500 MG tablet TAKE 1 TABLET BY MOUTH ONCE DAILY 30  tablet 5  . famotidine (PEPCID) 20 MG tablet Take 1 tablet (20 mg total) by mouth daily. 30 tablet 0  . pantoprazole (PROTONIX) 40 MG tablet Take 1 tablet (40 mg total) by mouth daily. 30 tablet 0  . PARoxetine (PAXIL) 40 MG tablet Take 1 tablet (40 mg total) by mouth 2 (two) times daily. 60 tablet 0   No current facility-administered medications on file prior to visit.    Review of Systems  Constitutional: Positive for fatigue. Negative for activity change, appetite change, fever and unexpected weight change.  HENT: Negative for congestion, ear pain, rhinorrhea, sinus pressure and sore throat.   Eyes: Negative for pain, redness and  visual disturbance.  Respiratory: Negative for cough, shortness of breath and wheezing.   Cardiovascular: Negative for chest pain and palpitations.  Gastrointestinal: Negative for abdominal pain, blood in stool, constipation and diarrhea.  Endocrine: Negative for polydipsia and polyuria.  Genitourinary: Negative for dysuria, frequency and urgency.  Musculoskeletal: Positive for arthralgias and myalgias. Negative for back pain.  Skin: Negative for pallor and rash.  Allergic/Immunologic: Negative for environmental allergies.  Neurological: Positive for headaches. Negative for dizziness and syncope.  Hematological: Negative for adenopathy. Does not bruise/bleed easily.  Psychiatric/Behavioral: Negative for decreased concentration and dysphoric mood. The patient is not nervous/anxious.        Objective:   Physical Exam Constitutional:      General: She is not in acute distress.    Appearance: Normal appearance. She is obese. She is not ill-appearing.  HENT:     Head: Normocephalic and atraumatic.  Cardiovascular:     Rate and Rhythm: Normal rate and regular rhythm.  Pulmonary:     Effort: Pulmonary effort is normal. No respiratory distress.     Breath sounds: Normal breath sounds. No wheezing.  Musculoskeletal:     Left hand: Swelling, tenderness and  bony tenderness present. No deformity or lacerations. Decreased range of motion. Normal capillary refill. Normal pulse.     Cervical back: Normal range of motion and neck supple. No tenderness.     Comments: Left hand:  Some mild swelling just proximal to 2nd mcp dorsally , also over 2nd/3rd mid mcp No warmth or redness Some reduced rom of 2,3rd digits due to pain  No obvious weakness No skin change Nl sens to light touch  Lymphadenopathy:     Cervical: No cervical adenopathy.  Skin:    General: Skin is warm and dry.     Findings: No erythema or rash.  Neurological:     Mental Status: She is alert.     Sensory: No sensory deficit.     Motor: No weakness.     Coordination: Coordination normal.  Psychiatric:        Mood and Affect: Mood normal.           Assessment & Plan:   Problem List Items Addressed This Visit      Other   Hand pain, left   Relevant Orders   CBC with Differential/Platelet   Uric acid   Basic metabolic panel   DG Hand Complete Left   Swelling of hand joint, left    Around 2nd mcp primarily with mild swelling but no erythema or warmth Quite tender (gout is in the differential)  Xray now  Uric acid and cbc and bmet  Tylenol for pain  Avoids nsaids for renal health  inst to update if worse and plan to follow results       Relevant Orders   CBC with Differential/Platelet   Uric acid   Basic metabolic panel   DG Hand Complete Left

## 2020-10-15 LAB — CBC WITH DIFFERENTIAL/PLATELET
Basophils Absolute: 0 10*3/uL (ref 0.0–0.1)
Basophils Relative: 0.7 % (ref 0.0–3.0)
Eosinophils Absolute: 0.1 10*3/uL (ref 0.0–0.7)
Eosinophils Relative: 1.7 % (ref 0.0–5.0)
HCT: 40 % (ref 36.0–46.0)
Hemoglobin: 13.4 g/dL (ref 12.0–15.0)
Lymphocytes Relative: 31.2 % (ref 12.0–46.0)
Lymphs Abs: 1.7 10*3/uL (ref 0.7–4.0)
MCHC: 33.5 g/dL (ref 30.0–36.0)
MCV: 99.4 fl (ref 78.0–100.0)
Monocytes Absolute: 0.3 10*3/uL (ref 0.1–1.0)
Monocytes Relative: 5.7 % (ref 3.0–12.0)
Neutro Abs: 3.3 10*3/uL (ref 1.4–7.7)
Neutrophils Relative %: 60.7 % (ref 43.0–77.0)
Platelets: 170 10*3/uL (ref 150.0–400.0)
RBC: 4.03 Mil/uL (ref 3.87–5.11)
RDW: 13.7 % (ref 11.5–15.5)
WBC: 5.5 10*3/uL (ref 4.0–10.5)

## 2020-10-15 LAB — BASIC METABOLIC PANEL
BUN: 20 mg/dL (ref 6–23)
CO2: 28 mEq/L (ref 19–32)
Calcium: 9.6 mg/dL (ref 8.4–10.5)
Chloride: 106 mEq/L (ref 96–112)
Creatinine, Ser: 1.39 mg/dL — ABNORMAL HIGH (ref 0.40–1.20)
GFR: 44.38 mL/min — ABNORMAL LOW (ref >60.00)
Glucose, Bld: 81 mg/dL (ref 70–99)
Potassium: 4 mEq/L (ref 3.5–5.1)
Sodium: 139 mEq/L (ref 135–145)

## 2020-10-15 LAB — URIC ACID: Uric Acid, Serum: 5.6 mg/dL (ref 2.4–7.0)

## 2020-10-15 LAB — HEPATIC FUNCTION PANEL
ALT: 13 U/L (ref 0–35)
AST: 17 U/L (ref 0–37)
Albumin: 4.2 g/dL (ref 3.5–5.2)
Alkaline Phosphatase: 39 U/L (ref 39–117)
Bilirubin, Direct: 0.1 mg/dL (ref 0.0–0.3)
Total Bilirubin: 0.4 mg/dL (ref 0.2–1.2)
Total Protein: 7.4 g/dL (ref 6.0–8.3)

## 2020-10-16 ENCOUNTER — Telehealth: Payer: Self-pay | Admitting: *Deleted

## 2020-10-16 NOTE — Telephone Encounter (Signed)
-----   Message from Abner Greenspan, MD sent at 10/15/2020 10:01 PM EST ----- Normal uric acid and cbc  Doubt gout based on that and exam  Cr is up a bit-please work hard on good water intake for kidney health Topical voltaren gel may be helpful  A course of prednisone may help inflammation if she wants to try it -let me know  Then if not improved I would refer to orthopedics

## 2020-10-16 NOTE — Telephone Encounter (Signed)
Left VM requesting pt to call the office back 

## 2020-10-20 ENCOUNTER — Ambulatory Visit (INDEPENDENT_AMBULATORY_CARE_PROVIDER_SITE_OTHER): Payer: Medicare Other | Admitting: Psychology

## 2020-10-20 DIAGNOSIS — F331 Major depressive disorder, recurrent, moderate: Secondary | ICD-10-CM | POA: Diagnosis not present

## 2020-10-26 ENCOUNTER — Other Ambulatory Visit: Payer: Self-pay | Admitting: Family Medicine

## 2020-10-26 ENCOUNTER — Encounter: Payer: Self-pay | Admitting: Family Medicine

## 2020-10-26 MED ORDER — ONDANSETRON 4 MG PO TBDP: ORAL_TABLET | ORAL | 3 refills | Status: DC

## 2020-10-26 NOTE — Telephone Encounter (Signed)
Dana Greer with [facility] left v/m requestinig refill zofran. [facility] closed at [place] to Dr Glori Bickers.

## 2020-10-28 ENCOUNTER — Other Ambulatory Visit: Payer: Self-pay | Admitting: Family Medicine

## 2020-10-28 NOTE — Telephone Encounter (Signed)
Pharmacy requests refill on: Promethazine 25 mg   LAST REFILL: 09/04/2020 (Q-30, R-1) LAST OV: 10/14/2020 NEXT OV: Not Scheduled  PHARMACY: [facility]   Pharmacy requests refill on: Tizanidine 4 mg   LAST REFILL: 08/18/2020 (Q-30, R-1) LAST OV: 10/14/2020 NEXT OV: Not Scheduled  PHARMACY: [facility]  Pharmacy requests refill on: Famotidine 20 mg    LAST REFILL: 05/18/2020 (Q-30, R-0) LAST OV: 10/14/2020 NEXT OV: Not Scheduled  PHARMACY: [facility]

## 2020-11-09 ENCOUNTER — Encounter: Payer: Self-pay | Admitting: Family Medicine

## 2020-11-17 ENCOUNTER — Ambulatory Visit (INDEPENDENT_AMBULATORY_CARE_PROVIDER_SITE_OTHER): Payer: Medicare Other | Admitting: Psychology

## 2020-11-17 DIAGNOSIS — F331 Major depressive disorder, recurrent, moderate: Secondary | ICD-10-CM | POA: Diagnosis not present

## 2020-11-24 ENCOUNTER — Other Ambulatory Visit: Payer: Self-pay | Admitting: Family Medicine

## 2020-11-24 NOTE — Telephone Encounter (Signed)
Looks like podiatry d/c med in "error" on 07/28/20, PCP last filled on 07/21/20 #60 with 3 additional refills, last OV was 10/14/20, please advise

## 2020-11-26 ENCOUNTER — Encounter: Payer: Self-pay | Admitting: Family Medicine

## 2020-12-01 DIAGNOSIS — Z1231 Encounter for screening mammogram for malignant neoplasm of breast: Secondary | ICD-10-CM | POA: Diagnosis not present

## 2020-12-01 DIAGNOSIS — Z01419 Encounter for gynecological examination (general) (routine) without abnormal findings: Secondary | ICD-10-CM | POA: Diagnosis not present

## 2020-12-01 DIAGNOSIS — Z7689 Persons encountering health services in other specified circumstances: Secondary | ICD-10-CM | POA: Diagnosis not present

## 2020-12-01 DIAGNOSIS — Z6838 Body mass index (BMI) 38.0-38.9, adult: Secondary | ICD-10-CM | POA: Diagnosis not present

## 2020-12-01 DIAGNOSIS — Z124 Encounter for screening for malignant neoplasm of cervix: Secondary | ICD-10-CM | POA: Diagnosis not present

## 2020-12-14 LAB — HM PAP SMEAR

## 2020-12-15 ENCOUNTER — Ambulatory Visit: Payer: Medicare Other | Admitting: Psychology

## 2020-12-23 ENCOUNTER — Other Ambulatory Visit: Payer: Self-pay | Admitting: Family Medicine

## 2020-12-23 NOTE — Telephone Encounter (Signed)
Hand pain/?gout appt was on 10/14/20, zanaflex last filled on 10/28/20 #30 tabs with 1 refill. Amlodipine was on pt's med list until OV with Dr. Lorelei Pont on 06/29/20, ? If that was in error or if pt should not be on med, please advise

## 2021-01-12 ENCOUNTER — Ambulatory Visit (INDEPENDENT_AMBULATORY_CARE_PROVIDER_SITE_OTHER): Payer: Medicare Other | Admitting: Psychology

## 2021-01-12 DIAGNOSIS — F331 Major depressive disorder, recurrent, moderate: Secondary | ICD-10-CM

## 2021-01-22 ENCOUNTER — Encounter: Payer: Self-pay | Admitting: Family Medicine

## 2021-01-25 ENCOUNTER — Other Ambulatory Visit: Payer: Self-pay | Admitting: Family Medicine

## 2021-01-25 ENCOUNTER — Other Ambulatory Visit: Payer: Self-pay | Admitting: *Deleted

## 2021-01-25 MED ORDER — GABAPENTIN 600 MG PO TABS
600.0000 mg | ORAL_TABLET | Freq: Three times a day (TID) | ORAL | 1 refills | Status: DC
Start: 2021-01-25 — End: 2021-08-26

## 2021-01-25 NOTE — Telephone Encounter (Signed)
Please clarify what dose she wants and directions, thanks , then I will send

## 2021-01-25 NOTE — Telephone Encounter (Signed)
Responded to pt's mychart message asking her this information

## 2021-01-25 NOTE — Telephone Encounter (Signed)
Pt said she take 600 mg TID  [facility]

## 2021-01-25 NOTE — Telephone Encounter (Signed)
Pt sent a message asking if PCP can refill Gabapentin to [facility]. Med is on med list as historical. Last time I can tell PCP filled gabapentin was 03/15/18 and it was the 300 mg tabs not 600 mg, please advise or send Rx to pharmacy

## 2021-02-04 ENCOUNTER — Encounter: Payer: Self-pay | Admitting: Family Medicine

## 2021-02-09 ENCOUNTER — Ambulatory Visit (INDEPENDENT_AMBULATORY_CARE_PROVIDER_SITE_OTHER): Payer: Medicare Other | Admitting: Psychology

## 2021-02-09 DIAGNOSIS — F331 Major depressive disorder, recurrent, moderate: Secondary | ICD-10-CM | POA: Diagnosis not present

## 2021-02-23 ENCOUNTER — Other Ambulatory Visit: Payer: Self-pay | Admitting: Family Medicine

## 2021-02-23 NOTE — Telephone Encounter (Signed)
Last OV was a possible Gout/ Arthritis   Valtrex last filled on 08/25/20 #30 tabs with 5 refills  Zofran last filled on 10/26/20 #30 tabs with 3 refills  Phenergan last filled on 10/28/20 #30 tabs with 3 refills

## 2021-02-25 ENCOUNTER — Encounter: Payer: Self-pay | Admitting: Family Medicine

## 2021-03-09 ENCOUNTER — Ambulatory Visit (INDEPENDENT_AMBULATORY_CARE_PROVIDER_SITE_OTHER): Payer: Medicare Other | Admitting: Psychology

## 2021-03-09 DIAGNOSIS — F331 Major depressive disorder, recurrent, moderate: Secondary | ICD-10-CM

## 2021-04-06 ENCOUNTER — Ambulatory Visit: Payer: Medicare Other | Admitting: Psychology

## 2021-04-08 ENCOUNTER — Encounter: Payer: Self-pay | Admitting: Family Medicine

## 2021-04-09 ENCOUNTER — Encounter: Payer: Self-pay | Admitting: Family Medicine

## 2021-04-09 DIAGNOSIS — G43119 Migraine with aura, intractable, without status migrainosus: Secondary | ICD-10-CM

## 2021-04-09 NOTE — Telephone Encounter (Signed)
Patient called about this. I transferred patient to Access Nurse to triage.

## 2021-04-09 NOTE — Telephone Encounter (Signed)
[facility] - Client TELEPHONE ADVICE RECORD [facility] Patient Name: Dana Greer Gender: Female DOB: [birth year] Age: 50 Y 27 M 22 D Return Phone Number: [phone] (Primary) Address: City/ State/ Zip: [place] [address] Client [facility] - Client Client Site [place] MD Contact Type Call Who Is Calling Patient / Member / Family / Caregiver Call Type Triage / Clinical Relationship To Patient Self Return Phone Number [phone] (Primary) Chief Complaint URINATE - sudden inability to urinate Reason for Call Symptomatic / Request for [facility] transferring a patient to be triaged The patient is very weak. She is experiencing inability to urinate [place] Translation No Nurse Assessment Nurse: Raenette Rover, RN, Zella Ball Date/Time [name] Time): 04/09/2021 1:42:24 PM Confirm and document reason for call. If symptomatic, describe symptoms. ---Velora Heckler transferring a patient to be triaged The patient is very weak. She is experiencing inability to urinate Throwing up for 3 days and taking in fluids, water juice, broth and only at 9:42 only had a tablespoon of urine. Does the patient have any new or worsening symptoms? ---Yes Will a triage be completed? ---Yes Related visit to physician within the last 2 weeks? ---No Does the PT have any chronic conditions? (i.e. diabetes, asthma, this includes High risk factors for pregnancy, etc.) ---Yes List chronic conditions. ---htn Is the patient pregnant or possibly pregnant? (Ask all females between the ages of 6-55) ---No Is this a behavioral health or substance abuse call? ---No Guidelines Guideline Title Affirmed Question Affirmed Notes Nurse Date/Time (Eastern Time) Weakness (Generalized) and Fatigue [1] SEVERE weakness (i.e., unable to walk  or barely able to walk, requires support) Raenette Rover, RN, Zella Ball 04/09/2021 1:45:05 PM PLEASE NOTE: All timestamps contained within this report are represented as [place] Standard Time. CONFIDENTIALTY NOTICE: This fax transmission is intended only for the addressee. It contains information that is legally privileged, confidential or otherwise protected from use or disclosure. If you are not the intended recipient, you are strictly prohibited from reviewing, disclosing, copying using or disseminating any of this information or taking any action in reliance on or regarding this information. If you have received this fax in error, please notify us immediately by telephone so that we can arrange for its return to [place]. Phone: [phone], Toll-Free: [phone], Fax: [phone] Page: 2 of 2 Call Id: [id number] Guidelines Guideline Title Affirmed Question Affirmed Notes Nurse Date/Time [name] Time) AND [2] new-onset or worsening Disp. Time [name] Time) Disposition Final User 04/09/2021 1:40:25 PM Send to Urgent Queue Renne Crigler 04/09/2021 1:55:39 PM [place], RN, Zella Ball Reason: Calling right now and getting dressed. 04/09/2021 1:46:18 PM Call EMS 911 Now Yes Raenette Rover, RN, Herbert Deaner Disagree/Comply Comply Caller Understands Yes PreDisposition Did not know what to do Care Advice Given Per Guideline CALL EMS 911 NOW: BRING MEDICINES: * Bring a list of your current medicines when you go to the Emergency Department (ER). * Bring the pill bottles too. This will help the doctor (or NP/PA) to make certain you are taking the right medicines and the right dose. CARE ADVICE given per Weakness and Fatigue (Adult) guideline. Comments User: Wilson Singer, RN Date/Time [name] Time): 04/09/2021 1:51:54 PM Caller state she is sleeping on the floor on a memory foam mattress due to unable to stand up. When she does everything spins. She has only had a table spoon of urine output  and  that was at 9:45 am. States she is cramping all over. Her legs, feet and cramps in her chest as well. Cannot stand or walk and if she walks her sons have to get on both sides of her and assit her in walking. Referrals GO TO FACILITY OTHER - SPECIFY

## 2021-04-19 NOTE — Telephone Encounter (Signed)
Can you make her an appointment and call her?    I can't evaluate a knee over an email or phone call, and I need to examine her.

## 2021-04-21 NOTE — Telephone Encounter (Signed)
Spoke with scheduled appointment

## 2021-04-25 NOTE — Progress Notes (Signed)
Dana Greer T. [place], MD, [place] at [facility] [place], [address]  Phone: [phone]  FAX: [place] - 50 y.o. female  MRN [id number]  Date of Birth: November 22, 1970  Date: 04/26/2021  PCP: Abner Greenspan, MD  Referral: Abner Greenspan, MD  Chief Complaint  Patient presents with   Knee Pain    Right    This visit occurred during the SARS-CoV-2 public health emergency.  Safety protocols were in place, including screening questions prior to the visit, additional usage of staff PPE, and extensive cleaning of exam room while observing appropriate contact time as indicated for disinfecting solutions.   Subjective:   Dana Greer is a 50 y.o. very pleasant female patient with Body mass index is 38.68 kg/m. who presents with the following:  Is a very nice lady and I remember very well from prior office visits.  Today she presents with some acute knee pain.  I did see her about 5 years ago for some right-sided knee pain, but this is a distinct evaluation today.  She relates to me via email that she has never had such symptoms before.  She has had some catching and popping and tried to meloxicam, topical Voltaren, Tiger balm and none of this is helped at all.  3 weeks of bad pain.  No injury at all.  Was sick when it happened.  R knee and L shoulder was hurting at that time.  No covid.   Going up steps one at a time.  Has tried some Mobic.  Voltaren gel did not help.  Her predominant pain is in the medial compartment.  She has now ambulating with difficulty going up and down steps with 1 foot at a time.  Failed knee tap, R with anteromedial converson  Review of Systems is noted in the HPI, as appropriate   Objective:   BP 108/80   Pulse 100   Temp 98.3 F (36.8 C) (Temporal)   Ht 5\' 2"  (1.575 m)   Wt 211 lb 8 oz (95.9 kg)   LMP 10/16/2019 (Within Days) Comment: had  hysterectomy-still has half of her uterus and ovaries  SpO2 99%   BMI 38.68 kg/m    Knee:  R Gait: Antalgic gait  ROM: Full extension flexion to 115 Effusion: Mild Echymosis or edema: none Patellar tendon NT Painful PLICA: neg Patellar grind: Positive Medial and lateral patellar facet loading: Painful medial and lateral joint lines: Medial joint line tenderness  Mcmurray's neg Flexion-pinch some mild pain with terminal flexion  varus and valgus stress: stable Lachman: neg Ant and Post drawer: neg Hip abduction, IR, ER: WNL Hip flexion str: 5/5 Hip abd: 5/5 Quad: 5/5 VMO atrophy:No Hamstring concentric and eccentric: 5/5   Radiology: DG Knee 4 Views W/Patella Right  Result Date: 04/26/2021 CLINICAL DATA:  Pain EXAM: RIGHT KNEE - COMPLETE 4+ VIEW; STANDING AP LEFT KNEE COMPARISON:  None. FINDINGS: Right knee: Standing frontal, standing tunnel, standing lateral, and bilateral oblique views were obtained. There is no appreciable fracture or dislocation. No joint effusion. There is moderate narrowing of the patellofemoral joint. There is also mild to moderate narrowing of the medial compartment and slightly less narrowing laterally. There is spurring in all compartments. No erosive changes. AP left knee: There is mild narrowing medially. There is mild spurring medially and laterally. No fracture or dislocation. IMPRESSION: Right knee: Moderate osteoarthritic change, most notably in the  patellofemoral joint and medial compartment regions. No fracture, dislocation, or joint effusion. AP left knee: Mild narrowing medially. Mild spurring. No fracture or dislocation. Electronically Signed   By: Lowella Grip III M.D.   On: 04/26/2021 10:20     Assessment and Plan:     ICD-10-CM   1. Acute pain of right knee  M25.561 DG Knee 4 Views W/Patella Right    triamcinolone acetonide (KENALOG-40) injection 40 mg    2. Effusion of right knee  M25.461 DG Knee 4 Views W/Patella Right     triamcinolone acetonide (KENALOG-40) injection 40 mg    3. Primary osteoarthritis of right knee  M17.11      Acute on chronic knee pain with arthritic exacerbation as well.  I think that short courses of NSAIDs are okay as well as Tylenol.  I would ice towards the end of the day.  She can continue to use Voltaren gel, but she does not think that this is really helped all that much.  I did attempt to aspirate her knee via the superolateral approach, but only was able to aspirate a small amount of blood.  I then converted to a medial approach sitting.  Aspiration/Injection Procedure Note Dana Greer Jul 03, 1971 Date of procedure: 04/26/2021  Procedure: Large Joint Aspiration / Injection of Knee, R Indications: Pain  Procedure Details Patient verbally consented to procedure. Risks, benefits, and alternatives explained. Sterilely prepped with Chloraprep. Ethyl cholride used for anesthesia. 9 cc Lidocaine 1% mixed with 1 mL of Kenalog 40 mg injected using the anteromedial approach without difficulty. No complications with procedure and tolerated well. Patient had decreased pain post-injection. Medication: 1 mL of Kenalog 40 mg   Meds ordered this encounter  Medications   triamcinolone acetonide (KENALOG-40) injection 40 mg   There are no discontinued medications. Orders Placed This Encounter  Procedures   DG Knee 4 Views W/Patella Right    Follow-up: No follow-ups on file.  Signed,  Maud Deed. Mihailo Sage, MD   Outpatient Encounter Medications as of 04/26/2021  Medication Sig   ALPRAZolam (XANAX) 1 MG tablet Take 1 mg by mouth 4 (four) times daily as needed for anxiety or sleep.    amitriptyline (ELAVIL) 25 MG tablet Take 2 tablets (50 mg total) by mouth daily.   amLODipine (NORVASC) 5 MG tablet TAKE 1 TABLET BY MOUTH ONCE DAILY   capsaicin (ZOSTRIX) 0.025 % cream Apply topically 2 (two) times daily.   cloNIDine (CATAPRES) 0.2 MG tablet Take 0.2 mg by mouth 2 (two) times  daily.   etonogestrel (NEXPLANON) 68 MG IMPL implant Inject 1 each into the skin continuous. Reported on 01/27/2016   famotidine (PEPCID) 20 MG tablet TAKE 1 TABLET BY MOUTH 2 TIMES DAILY   fluticasone (FLONASE) 50 MCG/ACT nasal spray Place 2 sprays into both nostrils daily.   gabapentin (NEURONTIN) 600 MG tablet Take 1 tablet (600 mg total) by mouth 3 (three) times daily.   MYRBETRIQ 25 MG TB24 tablet TAKE 1 TABLET BY MOUTH ONCE A DAY   ondansetron (ZOFRAN-ODT) 4 MG disintegrating tablet TAKE 1 TABLET BY MOUTH EVERY 8 HOURS AS NEEDED FOR NAUSEA AND VOMITING   promethazine (PHENERGAN) 25 MG tablet TAKE 1 TABLET BY MOUTH EVERY 8 HOURS AS NEEDED FOR NAUSEA AND VOMITING   PULMICORT FLEXHALER 90 MCG/ACT inhaler Inhale 1-2 puffs into the lungs daily.   scopolamine (TRANSDERM-SCOP, 1.5 MG,) 1 MG/3DAYS Place 1 patch (1.5 mg total) onto the skin every 3 (three) days.   tiZANidine (ZANAFLEX)  4 MG tablet TAKE 1 TABLET BY MOUTH EVERY 6 HOURS AS NEEDED FOR MUSCLE SPASMS (JAW PAIN/ HEADACHE)   topiramate (TOPAMAX) 100 MG tablet TAKE 1 TABLET BY MOUTH TWICE A DAY   valACYclovir (VALTREX) 500 MG tablet TAKE 1 TABLET BY MOUTH ONCE DAILY   pantoprazole (PROTONIX) 40 MG tablet Take 1 tablet (40 mg total) by mouth daily.   PARoxetine (PAXIL) 40 MG tablet Take 1 tablet (40 mg total) by mouth 2 (two) times daily.   [EXPIRED] triamcinolone acetonide (KENALOG-40) injection 40 mg    No facility-administered encounter medications on file as of 04/26/2021.

## 2021-04-26 ENCOUNTER — Ambulatory Visit (INDEPENDENT_AMBULATORY_CARE_PROVIDER_SITE_OTHER)
Admission: RE | Admit: 2021-04-26 | Discharge: 2021-04-26 | Disposition: A | Discharge status: Home / Self Care | Payer: Medicare Other | Source: Ambulatory Visit | Attending: Family Medicine | Admitting: Family Medicine

## 2021-04-26 ENCOUNTER — Encounter: Payer: Self-pay | Admitting: Family Medicine

## 2021-04-26 ENCOUNTER — Other Ambulatory Visit: Payer: Self-pay

## 2021-04-26 ENCOUNTER — Ambulatory Visit (INDEPENDENT_AMBULATORY_CARE_PROVIDER_SITE_OTHER): Payer: Medicare Other | Admitting: Family Medicine

## 2021-04-26 VITALS — BP 108/80 | HR 100 | Temp 98.3°F | Ht 62.0 in | Wt 211.5 lb

## 2021-04-26 DIAGNOSIS — M25561 Pain in right knee: Secondary | ICD-10-CM | POA: Diagnosis not present

## 2021-04-26 DIAGNOSIS — M25461 Effusion, right knee: Secondary | ICD-10-CM

## 2021-04-26 DIAGNOSIS — M170 Bilateral primary osteoarthritis of knee: Secondary | ICD-10-CM | POA: Diagnosis not present

## 2021-04-26 DIAGNOSIS — M1711 Unilateral primary osteoarthritis, right knee: Secondary | ICD-10-CM

## 2021-04-26 MED ORDER — TRIAMCINOLONE ACETONIDE 40 MG/ML IJ SUSP
40.0000 mg | Freq: Once | INTRAMUSCULAR | Status: AC
  Administered 2021-04-26: 40 mg via INTRA_ARTICULAR

## 2021-04-29 DIAGNOSIS — M25561 Pain in right knee: Secondary | ICD-10-CM

## 2021-04-29 DIAGNOSIS — M1711 Unilateral primary osteoarthritis, right knee: Secondary | ICD-10-CM

## 2021-05-04 ENCOUNTER — Telehealth: Payer: Self-pay

## 2021-05-04 ENCOUNTER — Ambulatory Visit (INDEPENDENT_AMBULATORY_CARE_PROVIDER_SITE_OTHER): Payer: Medicare Other | Admitting: Family Medicine

## 2021-05-04 ENCOUNTER — Other Ambulatory Visit: Payer: Self-pay

## 2021-05-04 ENCOUNTER — Encounter: Payer: Self-pay | Admitting: Family Medicine

## 2021-05-04 ENCOUNTER — Ambulatory Visit (INDEPENDENT_AMBULATORY_CARE_PROVIDER_SITE_OTHER): Payer: Medicare Other | Admitting: Psychology

## 2021-05-04 ENCOUNTER — Ambulatory Visit
Admission: EM | Admit: 2021-05-04 | Discharge: 2021-05-04 | Disposition: A | Discharge status: Home / Self Care | Payer: Medicare Other

## 2021-05-04 VITALS — BP 120/74 | HR 83 | Temp 98.4°F | Ht 62.0 in | Wt 206.5 lb

## 2021-05-04 DIAGNOSIS — L299 Pruritus, unspecified: Secondary | ICD-10-CM

## 2021-05-04 DIAGNOSIS — F331 Major depressive disorder, recurrent, moderate: Secondary | ICD-10-CM | POA: Diagnosis not present

## 2021-05-04 DIAGNOSIS — B029 Zoster without complications: Secondary | ICD-10-CM | POA: Diagnosis not present

## 2021-05-04 MED ORDER — HYDROCODONE-ACETAMINOPHEN 5-325 MG PO TABS: 1.0000 | ORAL_TABLET | Freq: Four times a day (QID) | ORAL | 0 refills | Status: AC | PRN

## 2021-05-04 MED ORDER — VALACYCLOVIR HCL 1 G PO TABS
1000.0000 mg | ORAL_TABLET | Freq: Three times a day (TID) | ORAL | 0 refills | Status: DC
Start: 2021-05-04 — End: 2021-08-26

## 2021-05-04 NOTE — Assessment & Plan Note (Signed)
Likely atypical presentation of shingles given on low dose preventative valacyclovir.  Complete  Valacyclovir shingles treatment course x 7 days.   NSAIDs  Should be limited given decreased renal funciton.  Use acetaminophen or hydrocodone for pain.  Do not use more than 4000 mg per day of acetaminophen.  Call if pain not controlled.  Get Shingrix vaccine series in 1-2 months.

## 2021-05-04 NOTE — Telephone Encounter (Signed)
[facility] - Client TELEPHONE ADVICE RECORD [facility] Patient Name: NAZIRAH TRI NG [place] Gender: Female DOB: 1971/05/10 Age: 50 Y 49 M 16 D Return Phone Number: [phone] (Primary), [phone] (Secondary) Address: City/ State/ Zip: [place] [address] Client [facility] - Client Client Site [place] Glori Bickers, Roque Lias - MD Contact Type Call Who Is Calling Patient / Member / Family / Caregiver Call Type Triage / Clinical Relationship To Patient Self Return Phone Number [phone] (Secondary) Chief Complaint NUMBNESS/TINGLING- sudden on one side of the body or face Reason for Call Symptomatic / Request for [place] states she is having side pain and itchy all over. She is also tingling from head to toe. Translation No Nurse Assessment Nurse: Doyle Askew, RN, Beth Date/Time (Eastern Time): 05/04/2021 11:46:28 AM Confirm and document reason for call. If symptomatic, describe symptoms. ---Caller states she is having back and left side pain and itchy all over. Caller states she is also tingling from head to toe on left side. Does the patient have any new or worsening symptoms? ---Yes Will a triage be completed? ---Yes Related visit to physician within the last 2 weeks? ---No Does the PT have any chronic conditions? (i.e. diabetes, asthma, this includes High risk factors for pregnancy, etc.) ---No Is the patient pregnant or possibly pregnant? (Ask all females between the ages of 78-55) ---No Is this a behavioral health or substance abuse call? ---No Guidelines Guideline Title Affirmed Question Affirmed Notes Nurse Date/Time (Eastern Time) Back Pain [1] SEVERE back pain (e.g., excruciating, unable to do any normal activities) AND [2] not improved 2 hours after pain medicine Doyle Askew, RN, Beth 05/04/2021 11:48:19 AM PLEASE NOTE: All timestamps  contained within this report are represented as [place] Standard Time. CONFIDENTIALTY NOTICE: This fax transmission is intended only for the addressee. It contains information that is legally privileged, confidential or otherwise protected from use or disclosure. If you are not the intended recipient, you are strictly prohibited from reviewing, disclosing, copying using or disseminating any of this information or taking any action in reliance on or regarding this information. If you have received this fax in error, please notify us immediately by telephone so that we can arrange for its return to [place]. Phone: [phone], Toll-Free: [phone], Fax: [phone] Page: 2 of 2 Call Id: [id number] [place]. Time [name] Time) Disposition Final User 05/04/2021 11:45:22 AM Send to Urgent Jeannette Corpus, Tiffany 05/04/2021 11:54:31 AM See HCP within 4 Hours (or PCP triage) Yes Doyle Askew, RN, Beth Caller Disagree/Comply Comply Caller Understands Yes PreDisposition Call Doctor Care Advice Given Per Guideline SEE HCP (OR PCP TRIAGE) WITHIN 4 HOURS: * IF OFFICE WILL BE OPEN: You need to be seen within the next 3 or 4 hours. Call your doctor (or NP/PA) now or as soon as the office opens. PAIN MEDICINES: * For pain relief, you can take either acetaminophen, ibuprofen, or naproxen. * They are over-the-counter (OTC) pain drugs. You can buy them at the drugstore. * ACETAMINOPHEN - REGULAR STRENGTH TYLENOL: Take 650 mg (two 325 mg pills) by mouth every 4 to 6 hours as needed. Each Regular Strength Tylenol pill has 325 mg of acetaminophen. The most you should take each day is 3,250 mg (10 pills a day). * IBUPROFEN (E.G., MOTRIN, ADVIL): Take 400 mg (two 200 mg pills) by mouth every 6 hours. The most you should take each day is 1,200 mg (six 200 mg pills), unless your  doctor has told you to take more. CALL BACK IF: * You become worse CARE ADVICE given per Back Pain (Adult) guideline. * UCC: Some UCCs can manage patients  who are stable and have less serious symptoms (e.g., minor illnesses and injuries). The triager must know the [facility] capabilities before sending a patient there. If unsure, call ahead. * IF OFFICE WILL BE CLOSED AND NO PCP (PRIMARY CARE PROVIDER) SECOND-LEVEL TRIAGE: You need to be seen within the next 3 or 4 hours. A nearby Urgent Care Center [facility]) is often a good source of care. Another choice is to go to the ED. Go sooner if you become worse. Comments User: Melene Muller, RN Date/Time [name] Time): 05/04/2021 11:48:44 AM Caller states this has been going on for 2 weeks User: Melene Muller, RN Date/Time [name] Time): 05/04/2021 11:54:29 AM spoke to Raquel Sarna at PCP office - no appts availabe today - was told to tell pt to be seen in [place] UNDECIDED REFERRED TO PCP OFFIC

## 2021-05-04 NOTE — Patient Instructions (Addendum)
Complete  valacyclovir course x 7 days.  Use acetaminophen or hydrocodone for pain.  Do not use more than 4000 mg per day of acetaminophen.  Call if pain not controlled.  Get Shingrix vaccine series in 1-2 months.

## 2021-05-04 NOTE — ED Triage Notes (Signed)
Pt presents with possible shingles.  Started on L mid back and wraps around torso.  Started 1-2 weeks ago. Has had shingles previously. Has lost her energy, felt exhausted.

## 2021-05-04 NOTE — Discharge Instructions (Addendum)
Follow up with your primary care provider if your symptoms are not improving.     

## 2021-05-04 NOTE — ED Provider Notes (Signed)
[facility]    CSN: [id number] Arrival date & time: 05/04/21  1348      History   Chief Complaint Chief Complaint  Patient presents with   Rash    HPI Dana Greer is a 50 y.o. female.  Patient presents with itching and tingling on her left mid back around to her left side x2 weeks.  She also reports fatigue.  No falls or injury.  She states she has history of shingles and thinks this may be shingles also.  She is currently taking Valtrex.  She denies rash, fever, chills, or other symptoms.  Her medical history includes hypertension, migraine headaches, fibromyalgia, immune deficiency disorder, arthritis, depression, stroke, chronic back pain, prediabetes.  The history is provided by the patient and medical records.   Past Medical History:  Diagnosis Date   Arthritis    Depression    Fibromyalgia    Headache(784.0)    Hx of adenomatous polyp of colon 02/14/2020   Hypertension    Immune deficiency disorder (HCC)    Migraine headache    Shingles    Stroke ([place])    Thrombocytopenia ([place]) 10/01/2012    Patient Active Problem List   Diagnosis Date Noted   Hand pain, left 10/14/2020   Swelling of hand joint, left 10/14/2020   TMJ (temporomandibular joint syndrome) 08/18/2020   AKI (acute kidney injury) ([place]) 05/15/2020   Obesity, Class II, BMI 35-39.9 05/15/2020   Non-intractable cyclical vomiting 14/43/1540   Left-sided back pain 02/25/2020   Hx of adenomatous polyp of colon 02/14/2020   Seasonal allergic rhinitis 01/21/2020   Chronic cough 10/04/2019   Headache, post-traumatic, acute 01/28/2019   Head injury, closed 01/17/2019   Post concussive syndrome 01/17/2019   Abnormal vaginal bleeding 07/06/2018   Neck pain 06/25/2018   Chronic migraine 04/12/2018   B12 deficiency 01/10/2018   Foot pain, bilateral 01/09/2018   Hand paresthesia 01/09/2018   Vitamin D deficiency 01/09/2018   Upper abdominal pain 11/22/2016   Chronic myofascial pain 10/26/2016    HSV-2 infection 08/17/2016   Adverse effects of medication 11/25/2015   Vertigo 10/08/2015   Carpal tunnel syndrome 03/16/2015   Gout 12/22/2014   Vertiginous migraine 02/03/2014   Intractable migraine 08/31/2012   Osteoarthritis 05/11/2012   Female bladder prolapse 05/11/2012   ADJ DISORDER WITH MIXED ANXIETY & DEPRESSED MOOD 06/30/2008   HYPERHIDROSIS 01/25/2008   BACK PAIN, LUMBAR, CHRONIC 10/16/2007   REACTION, ACUTE STRESS W/EMOTIONAL DSTURB 08/01/2007   Prediabetes 08/01/2007   Essential hypertension 08/01/2007   Goiter 07/11/2007   POLYCYSTIC OVARIAN DISEASE 05/24/2007   HIRSUTISM 05/24/2007   Arthropathy, multiple sites 05/24/2007    Past Surgical History:  Procedure Laterality Date   ABDOMINAL HYSTERECTOMY     CARPAL TUNNEL RELEASE Left 09/25/2018   Procedure: LEFT CARPAL TUNNEL RELEASE;  Surgeon: Daryll Brod, MD;  Location: [facility];  Service: Orthopedics;  Laterality: Left;   CESAREAN SECTION  2008   x1   PARTIAL HYSTERECTOMY  2012   due to fibroid   UPPER GASTROINTESTINAL ENDOSCOPY     WISDOM TOOTH EXTRACTION  1995    OB History     Gravida  3   Para      Term      Preterm      AB      Living  3      SAB      IAB      Ectopic      Multiple  Live Births           Obstetric Comments  Pt states that she has had a lot of miscarriages. Do not know how many          Home Medications    Prior to Admission medications   Medication Sig Start Date End Date Taking? Authorizing Provider  ALPRAZolam Duanne Moron) 1 MG tablet Take 1 mg by mouth 4 (four) times daily as needed for anxiety or sleep.    Yes [provider]  amitriptyline (ELAVIL) 25 MG tablet Take 2 tablets (50 mg total) by mouth daily. 05/18/20  Yes Hall, Carole N, DO  amLODipine (NORVASC) 5 MG tablet TAKE 1 TABLET BY MOUTH ONCE DAILY 12/23/20  Yes [name], Wynelle Fanny, MD  cloNIDine (CATAPRES) 0.2 MG tablet Take 0.2 mg by mouth 2 (two) times daily. 05/26/20   Yes [provider]  etonogestrel (NEXPLANON) 68 MG IMPL implant Inject 1 each into the skin continuous. Reported on 01/27/2016   Yes [provider]  famotidine (PEPCID) 20 MG tablet TAKE 1 TABLET BY MOUTH 2 TIMES DAILY 10/28/20  Yes [name], Marne A, MD  fluticasone (FLONASE) 50 MCG/ACT nasal spray Place 2 sprays into both nostrils daily. 10/04/19  Yes [name], Wynelle Fanny, MD  gabapentin (NEURONTIN) 600 MG tablet Take 1 tablet (600 mg total) by mouth 3 (three) times daily. 01/25/21  Yes [name], Wynelle Fanny, MD  MYRBETRIQ 25 MG TB24 tablet TAKE 1 TABLET BY MOUTH ONCE A DAY 01/25/21  Yes [name], Marne A, MD  ondansetron (ZOFRAN-ODT) 4 MG disintegrating tablet TAKE 1 TABLET BY MOUTH EVERY 8 HOURS AS NEEDED FOR NAUSEA AND VOMITING 02/23/21  Yes [name], Marne A, MD  pantoprazole (PROTONIX) 40 MG tablet Take 1 tablet (40 mg total) by mouth daily. 05/18/20 05/04/21 Yes Hall, Carole N, DO  PARoxetine (PAXIL) 40 MG tablet Take 1 tablet (40 mg total) by mouth 2 (two) times daily. 05/18/20 05/04/21 Yes Hall, Carole N, DO  promethazine (PHENERGAN) 25 MG tablet TAKE 1 TABLET BY MOUTH EVERY 8 HOURS AS NEEDED FOR NAUSEA AND VOMITING 02/23/21  Yes [name], Wynelle Fanny, MD  PULMICORT FLEXHALER 90 MCG/ACT inhaler Inhale 1-2 puffs into the lungs daily. 05/14/20  Yes [provider]  scopolamine (TRANSDERM-SCOP, 1.5 MG,) 1 MG/3DAYS Place 1 patch (1.5 mg total) onto the skin every 3 (three) days. 09/04/20  Yes [name], Marne A, MD  tiZANidine (ZANAFLEX) 4 MG tablet TAKE 1 TABLET BY MOUTH EVERY 6 HOURS AS NEEDED FOR MUSCLE SPASMS (JAW PAIN/ HEADACHE) 12/23/20  Yes [name], Wynelle Fanny, MD  valACYclovir (VALTREX) 500 MG tablet TAKE 1 TABLET BY MOUTH ONCE DAILY 02/23/21  Yes [name], Marne A, MD  capsaicin (ZOSTRIX) 0.025 % cream Apply topically 2 (two) times daily. 05/18/20   Kayleen Memos, DO  topiramate (TOPAMAX) 100 MG tablet TAKE 1 TABLET BY MOUTH TWICE A DAY 11/24/20   [name], Wynelle Fanny, MD    Family History Family History  Problem  Relation Age of Onset   Diabetes Father    COPD Father    Healthy Mother    Breast cancer Paternal Aunt    Colon cancer Paternal Grandmother    Rectal cancer Paternal Grandmother    Lung cancer Paternal Uncle    Lung cancer Paternal Aunt    Esophageal cancer Cousin    Stomach cancer Neg Hx     Social History Social History   Tobacco Use   Smoking status: Never   Smokeless tobacco: Never  Vaping Use  Vaping Use: Never used  Substance Use Topics   Alcohol use: Yes    Comment: occasional   Drug use: Yes    Types: Marijuana    Comment: smokes small amount twice daily     Allergies   Trazodone and nefazodone, Amoxicillin, and Carafate [sucralfate]   Review of Systems Review of Systems  Constitutional:  Negative for chills and fever.  Respiratory:  Negative for cough and shortness of breath.   Cardiovascular:  Negative for chest pain and palpitations.  Musculoskeletal:  Positive for back pain. Negative for gait problem.  Skin:  Negative for color change, rash and wound.  All other systems reviewed and are negative.   Physical Exam Triage Vital Signs ED Triage Vitals  Enc Vitals Group     BP      Pulse      Resp      Temp      Temp src      SpO2      Weight      Height      Head Circumference      Peak Flow      Pain Score      Pain Loc      Pain Edu?      Excl. in [place]?    No data found.  Updated Vital Signs BP 119/85 (BP Location: Left Arm)   Pulse 94   Temp 99.7 F (37.6 C) (Oral)   Resp 16   LMP 10/16/2019 (Within Days) Comment: had hysterectomy-still has half of her uterus and ovaries  SpO2 95%   Visual Acuity Right Eye Distance:   Left Eye Distance:   Bilateral Distance:    Right Eye Near:   Left Eye Near:    Bilateral Near:     Physical Exam Vitals and nursing note reviewed.  Constitutional:      General: She is not in acute distress.    Appearance: She is well-developed. She is not ill-appearing.  HENT:     Head: Normocephalic  and atraumatic.     Right Ear: Tympanic membrane normal.     Left Ear: Tympanic membrane normal.     Nose: Nose normal.     Mouth/Throat:     Mouth: Mucous membranes are moist.     Pharynx: Oropharynx is clear.  Eyes:     Conjunctiva/sclera: Conjunctivae normal.  Cardiovascular:     Rate and Rhythm: Normal rate and regular rhythm.     Heart sounds: Normal heart sounds.  Pulmonary:     Effort: Pulmonary effort is normal. No respiratory distress.     Breath sounds: Normal breath sounds.  Abdominal:     General: Bowel sounds are normal.     Palpations: Abdomen is soft.     Tenderness: There is no abdominal tenderness. There is no guarding or rebound.  Musculoskeletal:        General: Normal range of motion.     Cervical back: Neck supple.  Skin:    General: Skin is warm and dry.     Findings: No bruising, erythema, lesion or rash.  Neurological:     General: No focal deficit present.     Mental Status: She is alert and oriented to person, place, and time.     Gait: Gait normal.     UC Treatments / Results  Labs (all labs ordered are listed, but only abnormal results are displayed) Labs Reviewed - No data to display  EKG   Radiology No  results found.  Procedures Procedures (including critical care time)  Medications Ordered in UC Medications - No data to display  Initial Impression / Assessment and Plan / UC Course  I have reviewed the triage vital signs and the nursing notes.  Pertinent labs & imaging results that were available during my care of the patient were reviewed by me and considered in my medical decision making (see chart for details).   Itching.  There is no visible rash.  I discussed that shingles would have a blisterlike rash.  I offered the patient symptom relief for her itching.  She declines and expresses anger that I am not prescribing Valtrex.  She is already taking Valtrex.  I instructed her to follow-up with her primary care provider if her  symptoms are not improving.       Final Clinical Impressions(s) / UC Diagnoses   Final diagnoses:  Itching     Discharge Instructions      Follow up with your primary care provider if your symptoms are not improving.         ED Prescriptions   None    PDMP not reviewed this encounter.   Sharion Balloon, NP 05/04/21 1432

## 2021-05-04 NOTE — Telephone Encounter (Signed)
Patient was seen today by both Dr. Diona Browner and Urgent Care.  She cancelled appointment for this Thursday with Dr. Glori Bickers.

## 2021-05-04 NOTE — Progress Notes (Signed)
Patient ID: Dana Greer, female    DOB: September 08, 1971, 49 y.o.   MRN: [id number]  This visit was conducted in person.  BP 120/74   Pulse 83   Temp 98.4 F (36.9 C) (Temporal)   Ht 5\' 2"  (1.575 m)   Wt 206 lb 8 oz (93.7 kg)   LMP 10/16/2019 (Within Days) Comment: had hysterectomy-still has half of her uterus and ovaries  SpO2 97%   BMI 37.77 kg/m    CC: Chief Complaint  Patient presents with   Herpes Zoster    No rash yet.  Having Itching/Stinging Pain Left back radiates around to side-Worse at night    Subjective:   HPI: Dana Greer is a 50 y.o. female presenting on 05/04/2021 for Herpes Zoster (No rash yet.  Having Itching/Stinging Pain Left back radiates around to side-Worse at night)   She  reports new onset itching and tingly , needle pain in left torso. Warm burning pain Started  in last 3-4 control..  No rash noted.  Cannot sleep at night.  Pain 8/10.    Has tried  400 mg ... help a little.   She is on valacyclovir daily  500 mg for genital herpes. Has had shingles in same area in past. [facility]vere had shingrix vaccine.   Chronic back lumbar:  Not taking gabapentin as does not help. On elavil  Relevant past medical, surgical, family and social history reviewed and updated as indicated. Interim medical history since our last visit reviewed. Allergies and medications reviewed and updated. Outpatient Medications Prior to Visit  Medication Sig Dispense Refill   ALPRAZolam (XANAX) 1 MG tablet Take 1 mg by mouth 4 (four) times daily as needed for anxiety or sleep.      amitriptyline (ELAVIL) 25 MG tablet Take 2 tablets (50 mg total) by mouth daily. 60 tablet 0   amLODipine (NORVASC) 5 MG tablet TAKE 1 TABLET BY MOUTH ONCE DAILY 30 tablet 5   capsaicin (ZOSTRIX) 0.025 % cream Apply topically 2 (two) times daily. 60 g 0   cloNIDine (CATAPRES) 0.2 MG tablet Take 0.2 mg by mouth 2 (two) times daily.     etonogestrel (NEXPLANON) 68 MG IMPL implant Inject 1 each  into the skin continuous. Reported on 01/27/2016     famotidine (PEPCID) 20 MG tablet TAKE 1 TABLET BY MOUTH 2 TIMES DAILY 60 tablet 11   fluticasone (FLONASE) 50 MCG/ACT nasal spray Place 2 sprays into both nostrils daily. 16 g 11   gabapentin (NEURONTIN) 600 MG tablet Take 1 tablet (600 mg total) by mouth 3 (three) times daily. 270 tablet 1   MYRBETRIQ 25 MG TB24 tablet TAKE 1 TABLET BY MOUTH ONCE A DAY 90 tablet 1   ondansetron (ZOFRAN-ODT) 4 MG disintegrating tablet TAKE 1 TABLET BY MOUTH EVERY 8 HOURS AS NEEDED FOR NAUSEA AND VOMITING 30 tablet 5   pantoprazole (PROTONIX) 40 MG tablet Take 1 tablet (40 mg total) by mouth daily. 30 tablet 0   PARoxetine (PAXIL) 40 MG tablet Take 1 tablet (40 mg total) by mouth 2 (two) times daily. 60 tablet 0   promethazine (PHENERGAN) 25 MG tablet TAKE 1 TABLET BY MOUTH EVERY 8 HOURS AS NEEDED FOR NAUSEA AND VOMITING 30 tablet 5   PULMICORT FLEXHALER 90 MCG/ACT inhaler Inhale 1-2 puffs into the lungs daily.     scopolamine (TRANSDERM-SCOP, 1.5 MG,) 1 MG/3DAYS Place 1 patch (1.5 mg total) onto the skin every 3 (three) days. 5 patch 1  tiZANidine (ZANAFLEX) 4 MG tablet TAKE 1 TABLET BY MOUTH EVERY 6 HOURS AS NEEDED FOR MUSCLE SPASMS (JAW PAIN/ HEADACHE) 30 tablet 3   topiramate (TOPAMAX) 100 MG tablet TAKE 1 TABLET BY MOUTH TWICE A DAY 60 tablet 5   valACYclovir (VALTREX) 500 MG tablet TAKE 1 TABLET BY MOUTH ONCE DAILY 30 tablet 5   No facility-administered medications prior to visit.     Per HPI unless specifically indicated in ROS section below Review of Systems Objective:  BP 120/74   Pulse 83   Temp 98.4 F (36.9 C) (Temporal)   Ht 5\' 2"  (1.575 m)   Wt 206 lb 8 oz (93.7 kg)   LMP 10/16/2019 (Within Days) Comment: had hysterectomy-still has half of her uterus and ovaries  SpO2 97%   BMI 37.77 kg/m   Wt Readings from Last 3 Encounters:  05/04/21 206 lb 8 oz (93.7 kg)  04/26/21 211 lb 8 oz (95.9 kg)  10/14/20 204 lb 9 oz (92.8 kg)       Physical Exam Constitutional:      General: She is not in acute distress.    Appearance: Normal appearance. She is well-developed. She is not ill-appearing or toxic-appearing.  HENT:     Head: Normocephalic.     Right Ear: Hearing, tympanic membrane, ear canal and external ear normal. Tympanic membrane is not erythematous, retracted or bulging.     Left Ear: Hearing, tympanic membrane, ear canal and external ear normal. Tympanic membrane is not erythematous, retracted or bulging.     Nose: No mucosal edema or rhinorrhea.     Right Sinus: No maxillary sinus tenderness or frontal sinus tenderness.     Left Sinus: No maxillary sinus tenderness or frontal sinus tenderness.     Mouth/Throat:     Pharynx: Uvula midline.  Eyes:     General: Lids are normal. Lids are everted, no foreign bodies appreciated.     Conjunctiva/sclera: Conjunctivae normal.     Pupils: Pupils are equal, round, and reactive to light.  Neck:     Thyroid: No thyroid mass or thyromegaly.     Vascular: No carotid bruit.     Trachea: Trachea normal.  Cardiovascular:     Rate and Rhythm: Normal rate and regular rhythm.     Pulses: Normal pulses.     Heart sounds: Normal heart sounds, S1 normal and S2 normal. No murmur heard.   No friction rub. No gallop.  Pulmonary:     Effort: Pulmonary effort is normal. No tachypnea or respiratory distress.     Breath sounds: Normal breath sounds. No decreased breath sounds, wheezing, rhonchi or rales.  Abdominal:     General: Bowel sounds are normal.     Palpations: Abdomen is soft.     Tenderness: There is no abdominal tenderness.  Musculoskeletal:     Cervical back: Normal range of motion and neck supple.  Skin:    General: Skin is warm and dry.     Findings: No rash.          Comments:  No rash, dermatomal pain and hypersensitivity of skin  Neurological:     Mental Status: She is alert.  Psychiatric:        Mood and Affect: Mood is not anxious or depressed.         Speech: Speech normal.        Behavior: Behavior normal. Behavior is cooperative.        Thought Content: Thought content normal.  Judgment: Judgment normal.      Results for orders placed or performed in visit on 10/14/20  CBC with Differential/Platelet  Result Value Ref Range   WBC 5.5 4.0 - 10.5 K/uL   RBC 4.03 3.87 - 5.11 Mil/uL   Hemoglobin 13.4 12.0 - 15.0 g/dL   HCT 40.0 36.0 - 46.0 %   MCV 99.4 78.0 - 100.0 fl   MCHC 33.5 30.0 - 36.0 g/dL   RDW 13.7 11.5 - 15.5 %   Platelets 170.0 150.0 - 400.0 K/uL   Neutrophils Relative % 60.7 43.0 - 77.0 %   Lymphocytes Relative 31.2 12.0 - 46.0 %   Monocytes Relative 5.7 3.0 - 12.0 %   Eosinophils Relative 1.7 0.0 - 5.0 %   Basophils Relative 0.7 0.0 - 3.0 %   Neutro Abs 3.3 1.4 - 7.7 K/uL   Lymphs Abs 1.7 0.7 - 4.0 K/uL   Monocytes Absolute 0.3 0.1 - 1.0 K/uL   Eosinophils Absolute 0.1 0.0 - 0.7 K/uL   Basophils Absolute 0.0 0.0 - 0.1 K/uL  Uric acid  Result Value Ref Range   Uric Acid, Serum 5.6 2.4 - 7.0 mg/dL  Basic metabolic panel  Result Value Ref Range   Sodium 139 135 - 145 mEq/L   Potassium 4.0 3.5 - 5.1 mEq/L   Chloride 106 96 - 112 mEq/L   CO2 28 19 - 32 mEq/L   Glucose, Bld 81 70 - 99 mg/dL   BUN 20 6 - 23 mg/dL   Creatinine, Ser 1.39 (H) 0.40 - 1.20 mg/dL   GFR 44.38 (L) >60.00 mL/min   Calcium 9.6 8.4 - 10.5 mg/dL  Hepatic function panel  Result Value Ref Range   Total Bilirubin 0.4 0.2 - 1.2 mg/dL   Bilirubin, Direct 0.1 0.0 - 0.3 mg/dL   Alkaline Phosphatase 39 39 - 117 U/L   AST 17 0 - 37 U/L   ALT 13 0 - 35 U/L   Total Protein 7.4 6.0 - 8.3 g/dL   Albumin 4.2 3.5 - 5.2 g/dL    This visit occurred during the SARS-CoV-2 public health emergency.  Safety protocols were in place, including screening questions prior to the visit, additional usage of staff PPE, and extensive cleaning of exam room while observing appropriate contact time as indicated for disinfecting solutions.   COVID 19 screen:  No  recent travel or known exposure to COVID19 The patient denies respiratory symptoms of COVID 19 at this time. The importance of social distancing was discussed today.   Assessment and Plan Problem List Items Addressed This Visit     Herpes zoster without complication - Primary    Likely atypical presentation of shingles given on low dose preventative valacyclovir.  Complete  Valacyclovir shingles treatment course x 7 days.   NSAIDs  Should be limited given decreased renal funciton.  Use acetaminophen or hydrocodone for pain.  Do not use more than 4000 mg per day of acetaminophen.  Call if pain not controlled.  Get Shingrix vaccine series in 1-2 months.       Relevant Medications   valACYclovir (VALTREX) 1000 MG tablet       Eliezer Lofts, MD

## 2021-05-05 NOTE — Telephone Encounter (Signed)
Aware Notes reviewed

## 2021-05-06 ENCOUNTER — Ambulatory Visit: Payer: Medicare Other | Admitting: Family Medicine

## 2021-05-19 ENCOUNTER — Other Ambulatory Visit: Payer: Self-pay | Admitting: Family Medicine

## 2021-05-19 NOTE — Telephone Encounter (Signed)
Pt has had recent acute appt with other providers but last OV with PCP was 10/14/20,   Zanaflex last filled on 12/23/20 #30 tabs with 3 refill  Topamax last filled on 11/24/20 #60 tabs with 5 refill

## 2021-06-01 ENCOUNTER — Encounter: Payer: Self-pay | Admitting: Family Medicine

## 2021-06-01 NOTE — Telephone Encounter (Signed)
Will route to PCP and all referral pool to check status of referral

## 2021-06-02 NOTE — Telephone Encounter (Signed)
It looks like this referral was sent to [facility] and they closed out the referral without notifying our office. It was sent by someone covering for our office back in June. I reviewed the referral and it looks like they called to schedule the patient and she stated that she wanted to go to Dr Boyd Kerbs - the notes also stated the requested provider, I am not sure why they sent the referral to [place].   Notes below from referral Patient wanted to have a referral for  Dr  Boyd Kerbs. We do not have that provider in our office at [facility].  I think this was sent to [place] by mistake  I have corrected this referral and sent it to the right location.  I contacted the patient and made her aware as well and apologized for the inconvenience.

## 2021-06-14 ENCOUNTER — Encounter: Payer: Self-pay | Admitting: Family Medicine

## 2021-06-24 ENCOUNTER — Other Ambulatory Visit: Payer: Self-pay | Admitting: Family Medicine

## 2021-06-24 DIAGNOSIS — M5417 Radiculopathy, lumbosacral region: Secondary | ICD-10-CM | POA: Diagnosis not present

## 2021-06-24 DIAGNOSIS — M5412 Radiculopathy, cervical region: Secondary | ICD-10-CM | POA: Diagnosis not present

## 2021-06-24 DIAGNOSIS — Z79899 Other long term (current) drug therapy: Secondary | ICD-10-CM | POA: Diagnosis not present

## 2021-06-24 DIAGNOSIS — R202 Paresthesia of skin: Secondary | ICD-10-CM | POA: Diagnosis not present

## 2021-06-24 DIAGNOSIS — G5603 Carpal tunnel syndrome, bilateral upper limbs: Secondary | ICD-10-CM | POA: Diagnosis not present

## 2021-06-24 DIAGNOSIS — G43711 Chronic migraine without aura, intractable, with status migrainosus: Secondary | ICD-10-CM | POA: Diagnosis not present

## 2021-06-24 DIAGNOSIS — R112 Nausea with vomiting, unspecified: Secondary | ICD-10-CM | POA: Diagnosis not present

## 2021-06-29 ENCOUNTER — Ambulatory Visit (INDEPENDENT_AMBULATORY_CARE_PROVIDER_SITE_OTHER): Payer: Medicare Other | Admitting: Psychology

## 2021-06-29 ENCOUNTER — Other Ambulatory Visit: Payer: Self-pay

## 2021-06-29 DIAGNOSIS — F331 Major depressive disorder, recurrent, moderate: Secondary | ICD-10-CM

## 2021-07-06 NOTE — Telephone Encounter (Signed)
Spoke with patient scheduled appointment

## 2021-07-08 ENCOUNTER — Encounter: Payer: Self-pay | Admitting: Family Medicine

## 2021-07-08 ENCOUNTER — Ambulatory Visit (INDEPENDENT_AMBULATORY_CARE_PROVIDER_SITE_OTHER): Payer: Medicare Other | Admitting: Family Medicine

## 2021-07-08 ENCOUNTER — Other Ambulatory Visit: Payer: Self-pay

## 2021-07-08 VITALS — BP 150/100 | HR 90 | Temp 98.1°F | Ht 62.0 in | Wt 215.0 lb

## 2021-07-08 DIAGNOSIS — G8929 Other chronic pain: Secondary | ICD-10-CM | POA: Diagnosis not present

## 2021-07-08 DIAGNOSIS — M5416 Radiculopathy, lumbar region: Secondary | ICD-10-CM | POA: Diagnosis not present

## 2021-07-08 DIAGNOSIS — M549 Dorsalgia, unspecified: Secondary | ICD-10-CM | POA: Diagnosis not present

## 2021-07-08 MED ORDER — PREDNISONE 20 MG PO TABS: ORAL_TABLET | ORAL | 0 refills | Status: DC

## 2021-07-08 MED ORDER — CYCLOBENZAPRINE HCL 10 MG PO TABS: 10.0000 mg | ORAL_TABLET | Freq: Every evening | ORAL | 1 refills | Status: DC | PRN

## 2021-07-08 NOTE — Progress Notes (Signed)
Dana Greer T. Lorelle Macaluso, MD, [place] at [facility] [place], [address]  Phone: [phone]  FAX: [place] - 50 y.o. female  MRN [id number]  Date of Birth: 07-02-1971  Date: 07/08/2021  PCP: Abner Greenspan, MD  Referral: Abner Greenspan, MD  Chief Complaint  Patient presents with  . Hip Pain    Left    This visit occurred during the SARS-CoV-2 public health emergency.  Safety protocols were in place, including screening questions prior to the visit, additional usage of staff PPE, and extensive cleaning of exam room while observing appropriate contact time as indicated for disinfecting solutions.   Subjective:   Dana Greer is a 50 y.o. very pleasant female patient with Body mass index is 39.32 kg/m. who presents with the following:  F/u L hip:  Left hip severe, with standing up it really really hurts.  Back and side rradicular pain. She does not describe anterior groin pain  Hurts everwhere except when lying flar.   Has been using some epsom salts, tiger balm Taken almost everything Dana Greer will help some.  Pain in the back and buttock area.  She denies bowel or bladder incontinence.  No saddle anesthesia.  X-rays and MRI: This was reviewed from May and June, 2021.  On the plain film, the patient does have some lumbar lordosis, but she does have minimal to no significant degenerative disc disease. On the patient's MRI of her thoracic spine she does have multilevel degenerative disc disease.  She also has facet arthropathy in multiple locations.  At that time greater than 1 year ago the patient did have some T2 signal at T11-T12 and could have represented a stress reaction. I reviewed the plain films myself.  There is no associated radiology interpretation. Electronically Signed  By: Owens Loffler, MD On: 07/08/2021  9:00 AM EDT   Review of Systems is noted in the HPI, as  appropriate  Objective:   BP (!) 150/100   Pulse 90   Temp 98.1 F (36.7 C) (Temporal)   Ht '5\' 2"'$  (1.575 m)   Wt 215 lb (97.5 kg)   LMP 10/16/2019 (Within Days) Comment: had hysterectomy-still has half of her uterus and ovaries  SpO2 99%   BMI 39.32 kg/m   GEN: No acute distress; alert,appropriate. PULM: Breathing comfortably in no respiratory distress PSYCH: Normally interactive.    Range of motion at  the waist: Flexion, extension, lateral bending and rotation: She can forward flex to the knees, and extension causes some significant pain, but lateral bending and rotation maneuvers are normal.  L3-S1 bilaterally  No echymosis or edema Rises to examination table with mild difficulty Gait: minimally antalgic  Inspection/Deformity: N Paraspinus Tenderness: L3-S1 bilaterally  B Ankle Dorsiflexion (L5,4): 5/5 B Great Toe Dorsiflexion (L5,4): 5/5 Heel Walk (L5): WNL Toe Walk (S1): WNL Rise/Squat (L4): WNL, mild pain  SENSORY B Medial Foot (L4): WNL B Dorsum (L5): WNL B Lateral (S1): WNL Light Touch: WNL Pinprick: WNL  REFLEXES Knee (L4): 2+ Ankle (S1): 2+  B SLR, seated: neg B SLR, supine: neg B FABER: Painful, predominantly on the left B Reverse FABER: neg B Greater Troch: NT B Log Roll: neg B Sciatic Notch: NT    HIP EXAM: SIDE: Bilateral ROM: Abduction, Flexion, Internal and External range of motion: Grossly full Pain with terminal IROM and EROM: Minimal bilateral GTB: NT SLR: NEG Knees: No effusion Piriformis: NT  at direct palpation Str: flexion: 5/5 abduction: 5/5 adduction: 5/5 Strength testing non-tender    Laboratory and Imaging Data: CLINICAL DATA:  Mid back pain and low back pain. Left-sided abdominal pain and numbness. Left groin and leg pain.   EXAM: MRI THORACIC SPINE WITHOUT CONTRAST   TECHNIQUE: Multiplanar, multisequence MR imaging of the thoracic spine was performed. No intravenous contrast was administered.   COMPARISON:   Chest radiographs 01/21/2020. CT abdomen and pelvis 01/17/2020.   FINDINGS: Alignment: Severe dextroscoliosis with apex at T8. No listhesis in the sagittal plane.   Vertebrae: Left facet edema at T11-12 extending into the pedicles as well as the T11 lamina and spinous process and into the right T11 inferior articular process with underlying advanced facet arthropathy at these levels. No displaced fracture identified. Preserved vertebral body heights. No suspicious marrow lesion.   Cord:  Normal signal and morphology.   Paraspinal and other soft tissues: Unremarkable.   Disc levels:   Mild disc bulging throughout the mid and lower thoracic spine without spinal stenosis. Widespread thoracic facet arthrosis, particularly severe on the right from T3-4 to T6-7 and on the left from T9-10 to T11-12. Resultant moderate neural foraminal stenosis on the right at T3-4 and T4-5 and on the left at T9-10 and T10-11.   IMPRESSION: 1. Severe thoracic dextroscoliosis. 2. Left greater than right posterior element edema at T11-12 favored to be secondary to advanced facet arthropathy and stress reaction. If there is an unreported history of trauma, thoracic spine CT could be performed to exclude an underlying fracture. 3. Mild multilevel disc bulging without spinal stenosis. 4. Advanced multilevel facet arthrosis with associated moderate neural foraminal stenosis as above.     Electronically Signed   By: Logan Bores M.D.   On: 04/10/2020 08:04  Assessment and Plan:     ICD-10-CM   1. Lumbar radiculopathy, acute  M54.16 Ambulatory referral to Physical Therapy    2. Chronic left-sided back pain, unspecified back location  M54.9 Ambulatory referral to Physical Therapy   G89.29      Acute on chronic back pain with exacerbation.  Multilevel degenerative disc disease seen in the lowest part of the thoracic spine as well as facet arthropathy that is not visualized very well in a AP lumbar  spine and lateral lumbar spine alone.  I am going to have the patient do formal physical therapy. Steroids and Flexeril at night.  There is no neurological changes or other red flags.  Meds ordered this encounter  Medications  . predniSONE (DELTASONE) 20 MG tablet    Sig: 2 tabs po for 7 days, then 1 tab po for 7 days    Dispense:  21 tablet    Refill:  0  . cyclobenzaprine (FLEXERIL) 10 MG tablet    Sig: Take 1 tablet (10 mg total) by mouth at bedtime as needed for muscle spasms.    Dispense:  30 tablet    Refill:  1   Medications Discontinued During This Encounter  Medication Reason  . ondansetron (ZOFRAN-ODT) 4 MG disintegrating tablet Change in therapy  . tiZANidine (ZANAFLEX) 4 MG tablet    Orders Placed This Encounter  Procedures  . Ambulatory referral to Physical Therapy    Follow-up: Return in about 6 weeks (around 08/19/2021).  Dragon [employer] One speech-to-text software was used for transcription in this dictation.  Possible transcriptional errors can occur using Editor, commissioning.   Signed,  Maud Deed. Kennethia Lynes, MD   Outpatient Encounter Medications as of 07/08/2021  Medication Sig  . ALPRAZolam (XANAX) 1 MG tablet Take 1 mg by mouth 4 (four) times daily as needed for anxiety or sleep.   Marland Kitchen amitriptyline (ELAVIL) 25 MG tablet Take 2 tablets (50 mg total) by mouth daily.  Marland Kitchen amLODipine (NORVASC) 5 MG tablet TAKE 1 TABLET BY MOUTH ONCE DAILY  . capsaicin (ZOSTRIX) 0.025 % cream Apply topically 2 (two) times daily.  . cloNIDine (CATAPRES) 0.2 MG tablet Take 0.2 mg by mouth 2 (two) times daily.  . cyclobenzaprine (FLEXERIL) 10 MG tablet Take 1 tablet (10 mg total) by mouth at bedtime as needed for muscle spasms.  Marland Kitchen etonogestrel (NEXPLANON) 68 MG IMPL implant Inject 1 each into the skin continuous. Reported on 01/27/2016  . famotidine (PEPCID) 20 MG tablet TAKE 1 TABLET BY MOUTH 2 TIMES DAILY  . fluticasone (FLONASE) 50 MCG/ACT nasal spray Place 2 sprays into both nostrils  daily.  Marland Kitchen gabapentin (NEURONTIN) 600 MG tablet Take 1 tablet (600 mg total) by mouth 3 (three) times daily.  Marland Kitchen Galcanezumab-gnlm (EMGALITY [place]) Inject into the skin.  Marland Kitchen MYRBETRIQ 25 MG TB24 tablet TAKE 1 TABLET BY MOUTH ONCE A DAY  . ondansetron (ZOFRAN) 8 MG tablet Take 8 mg by mouth 3 (three) times daily.  . predniSONE (DELTASONE) 20 MG tablet 2 tabs po for 7 days, then 1 tab po for 7 days  . promethazine (PHENERGAN) 25 MG tablet TAKE 1 TABLET BY MOUTH EVERY 8 HOURS AS NEEDED FOR NAUSEA AND VOMITING  . PULMICORT FLEXHALER 90 MCG/ACT inhaler Inhale 1-2 puffs into the lungs daily.  Marland Kitchen scopolamine (TRANSDERM-SCOP, 1.5 MG,) 1 MG/3DAYS Place 1 patch (1.5 mg total) onto the skin every 3 (three) days.  Marland Kitchen topiramate (TOPAMAX) 100 MG tablet TAKE 1 TABLET BY MOUTH TWICE A DAY  . Ubrogepant (UBRELVY PO) Take by mouth.  . valACYclovir (VALTREX) 1000 MG tablet Take 1 tablet (1,000 mg total) by mouth 3 (three) times daily.  . valACYclovir (VALTREX) 500 MG tablet TAKE 1 TABLET BY MOUTH ONCE DAILY  . [DISCONTINUED] tiZANidine (ZANAFLEX) 4 MG tablet TAKE 1 TABLET BY MOUTH EVERY 6 HOURS AS NEEDED FOR MUSCLE SPASMS (JAW PAIN/HEADACHE)  . pantoprazole (PROTONIX) 40 MG tablet Take 1 tablet (40 mg total) by mouth daily.  Marland Kitchen PARoxetine (PAXIL) 40 MG tablet Take 1 tablet (40 mg total) by mouth 2 (two) times daily.  . [DISCONTINUED] ondansetron (ZOFRAN-ODT) 4 MG disintegrating tablet TAKE 1 TABLET BY MOUTH EVERY 8 HOURS AS NEEDED FOR NAUSEA AND VOMITING   No facility-administered encounter medications on file as of 07/08/2021.

## 2021-07-22 DIAGNOSIS — G43711 Chronic migraine without aura, intractable, with status migrainosus: Secondary | ICD-10-CM | POA: Diagnosis not present

## 2021-07-22 DIAGNOSIS — M542 Cervicalgia: Secondary | ICD-10-CM | POA: Diagnosis not present

## 2021-07-22 DIAGNOSIS — G5603 Carpal tunnel syndrome, bilateral upper limbs: Secondary | ICD-10-CM | POA: Diagnosis not present

## 2021-07-22 DIAGNOSIS — M545 Low back pain, unspecified: Secondary | ICD-10-CM | POA: Diagnosis not present

## 2021-07-25 ENCOUNTER — Other Ambulatory Visit: Payer: Self-pay

## 2021-07-25 ENCOUNTER — Ambulatory Visit (INDEPENDENT_AMBULATORY_CARE_PROVIDER_SITE_OTHER): Payer: Medicare Other

## 2021-07-25 DIAGNOSIS — Z0000 Encounter for general adult medical examination without abnormal findings: Secondary | ICD-10-CM | POA: Diagnosis not present

## 2021-07-25 NOTE — Progress Notes (Signed)
Subjective:   Dana Greer is a 50 y.o. female who presents for Medicare Annual (Subsequent) preventive examination.  I connected with  [name] on 07/25/21 by a video enabled telemedicine application and verified that I am speaking with the correct person using two identifiers.   I discussed the limitations of evaluation and management by telemedicine. The patient expressed understanding and agreed to proceed.   Location of Patient: Home Location of Provider: Office Persons Participating in Visit: [place]  Review of Systems    Defer to PCP       Objective:    Today's Vitals   07/25/21 0859  PainSc: 6    There is no height or weight on file to calculate BMI.  Advanced Directives 07/25/2021 05/16/2020 05/15/2020 04/10/2019 01/10/2019 11/23/2018 09/25/2018  Does Patient Have a Medical Advance Directive? No - No No No No No  Type of Advance Directive - - - - - - -  Copy of Healthcare Power of Attorney in Chart? - - - - - - -  Would patient like information on creating a medical advance directive? No - Patient declined No - Patient declined - No - Patient declined No - Patient declined No - Patient declined No - Patient declined    Current Medications (verified) Outpatient Encounter Medications as of 07/25/2021  Medication Sig   ALPRAZolam (XANAX) 1 MG tablet Take 1 mg by mouth 4 (four) times daily as needed for anxiety or sleep.    amitriptyline (ELAVIL) 25 MG tablet Take 2 tablets (50 mg total) by mouth daily.   amLODipine (NORVASC) 5 MG tablet TAKE 1 TABLET BY MOUTH ONCE DAILY   capsaicin (ZOSTRIX) 0.025 % cream Apply topically 2 (two) times daily.   cloNIDine (CATAPRES) 0.2 MG tablet Take 0.2 mg by mouth 2 (two) times daily.   etonogestrel (NEXPLANON) 68 MG IMPL implant Inject 1 each into the skin continuous. Reported on 01/27/2016   famotidine (PEPCID) 20 MG tablet TAKE 1 TABLET BY MOUTH 2 TIMES DAILY   fluticasone (FLONASE)  50 MCG/ACT nasal spray Place 2 sprays into both nostrils daily.   gabapentin (NEURONTIN) 600 MG tablet Take 1 tablet (600 mg total) by mouth 3 (three) times daily.   MYRBETRIQ 25 MG TB24 tablet TAKE 1 TABLET BY MOUTH ONCE A DAY   ondansetron (ZOFRAN) 8 MG tablet Take 8 mg by mouth 3 (three) times daily.   promethazine (PHENERGAN) 25 MG tablet TAKE 1 TABLET BY MOUTH EVERY 8 HOURS AS NEEDED FOR NAUSEA AND VOMITING   PULMICORT FLEXHALER 90 MCG/ACT inhaler Inhale 1-2 puffs into the lungs daily.   scopolamine (TRANSDERM-SCOP, 1.5 MG,) 1 MG/3DAYS Place 1 patch (1.5 mg total) onto the skin every 3 (three) days.   topiramate (TOPAMAX) 100 MG tablet TAKE 1 TABLET BY MOUTH TWICE A DAY   valACYclovir (VALTREX) 1000 MG tablet Take 1 tablet (1,000 mg total) by mouth 3 (three) times daily.   valACYclovir (VALTREX) 500 MG tablet TAKE 1 TABLET BY MOUTH ONCE DAILY   cyclobenzaprine (FLEXERIL) 10 MG tablet Take 1 tablet (10 mg total) by mouth at bedtime as needed for muscle spasms. (Patient not taking: Reported on 07/25/2021)   Galcanezumab-gnlm (EMGALITY [place]) Inject into the skin.   pantoprazole (PROTONIX) 40 MG tablet Take 1 tablet (40 mg total) by mouth daily.   PARoxetine (PAXIL) 40 MG tablet Take 1 tablet (40 mg total) by mouth 2 (two) times daily.   predniSONE (  DELTASONE) 20 MG tablet 2 tabs po for 7 days, then 1 tab po for 7 days (Patient not taking: Reported on 07/25/2021)   Ubrogepant (UBRELVY PO) Take by mouth.   No facility-administered encounter medications on file as of 07/25/2021.    Allergies (verified) Trazodone and nefazodone, Amoxicillin, and Carafate [sucralfate]   History: Past Medical History:  Diagnosis Date   Arthritis    Depression    Fibromyalgia    Headache(784.0)    Hx of adenomatous polyp of colon 02/14/2020   Hypertension    Immune deficiency disorder ([place])    Migraine headache    Shingles    Stroke ([place])    Thrombocytopenia ([place]) 10/01/2012   Past Surgical History:   Procedure Laterality Date   ABDOMINAL HYSTERECTOMY     CARPAL TUNNEL RELEASE Left 09/25/2018   Procedure: LEFT CARPAL TUNNEL RELEASE;  Surgeon: Daryll Brod, MD;  Location: [address];  Service: Orthopedics;  Laterality: Left;   CESAREAN SECTION  2008   x1   PARTIAL HYSTERECTOMY  2012   due to fibroid   UPPER GASTROINTESTINAL ENDOSCOPY     WISDOM TOOTH EXTRACTION  1995   Family History  Problem Relation Age of Onset   Diabetes Father    COPD Father    Healthy Mother    Breast cancer Paternal Aunt    Colon cancer Paternal Grandmother    Rectal cancer Paternal Grandmother    Lung cancer Paternal Uncle    Lung cancer Paternal Aunt    Esophageal cancer Cousin    Stomach cancer Neg Hx    Social History   Socioeconomic History   Marital status: Legally Separated    Spouse name: Not on file   Number of children: 3   Years of education: college   Highest education level: Not on file  Occupational History   Occupation: Unemployed  Tobacco Use   Smoking status: Never   Smokeless tobacco: Never  Vaping Use   Vaping Use: Never used  Substance and Sexual Activity   Alcohol use: Not Currently    Comment: occasional   Drug use: Not Currently    Types: Marijuana    Comment: smokes small amount twice daily   Sexual activity: Not Currently    Birth control/protection: None  Other Topics Concern   Not on file  Social History Narrative   Lives at home with her children.   6 cups caffeine per day.   Right-handed.   Social Determinants of Health   Financial Resource Strain: High Risk   Difficulty of Paying Living Expenses: Very hard  Food Insecurity: No Food Insecurity   Worried About Charity fundraiser in the Last Year: Never true   Ran Out of Food in the Last Year: Never true  Transportation Needs: No Transportation Needs   Lack of Transportation (Medical): No   Lack of Transportation (Non-Medical): No  Physical Activity: Sufficiently Active   Days of  Exercise per Week: 7 days   Minutes of Exercise per Session: 30 min  Stress: Stress Concern Present   Feeling of Stress : Very much  Social Connections: Moderately Isolated   Frequency of Communication with Friends and Family: More than three times a week   Frequency of Social Gatherings with Friends and Family: Not on file   Attends Religious Services: More than 4 times per year   Active Member of [employer] or Organizations: No   Attends Archivist Meetings: Never   Marital Status: Separated  Tobacco Counseling Counseling given: Not Answered   Clinical Intake:  Pre-visit preparation completed: Yes  Pain : 0-10 Pain Score: 6  Pain Type: Chronic pain Pain Location: Back Pain Orientation: Lower, Left Pain Descriptors / Indicators: Pressure, Shooting, Aching, Discomfort, Stabbing, Spasm Pain Onset: 1 to 4 weeks ago Pain Frequency: Constant Pain Relieving Factors: Was giving pain meds but not working and sits in warm bath  Pain Relieving Factors: Was giving pain meds but not working and sits in warm bath  Diabetes: No  How often do you need to have someone help you when you read instructions, pamphlets, or other written materials from your doctor or pharmacy?: 1 - Never What is the last grade level you completed in school?: Some College  Diabetic?No  Interpreter Needed?: No      Activities of Daily Living In your present state of health, do you have any difficulty performing the following activities: 07/25/2021  Hearing? N  Vision? N  Difficulty concentrating or making decisions? N  Walking or climbing stairs? Y  Comment sometimes due to back  Dressing or bathing? N  Doing errands, shopping? N  Preparing Food and eating ? N  Using the Toilet? N  In the past six months, have you accidently leaked urine? Y  Comment yes everyday  Do you have problems with loss of bowel control? N  Managing your Medications? N  Managing your Finances? N  Housekeeping or  managing your Housekeeping? N  Some recent data might be hidden    Patient Care Team: [name], Wynelle Fanny, MD as PCP - General (Family Medicine) Marcial Pacas, MD as Attending Physician (Neurology)  Indicate any recent Medical Services you may have received from other than [facility] providers in the past year (date may be approximate).     Assessment:   This is a routine wellness examination for [place].  Hearing/Vision screen No results found.  Dietary issues and exercise activities discussed: Current Exercise Habits: Home exercise routine, Type of exercise: walking, Time (Minutes): 30, Frequency (Times/Week): 7, Weekly Exercise (Minutes/Week): 210, Intensity: Mild   Goals Addressed   None    Depression Screen PHQ 2/9 Scores 07/25/2021 04/10/2019 03/21/2018 10/21/2016 01/27/2016  PHQ - 2 Score 0 0 5 5 0  PHQ- 9 Score - 0 12 12 -    Fall Risk Fall Risk  07/25/2021 04/10/2019 03/21/2018 10/21/2016 01/27/2016  Falls in the past year? 1 0 No No No  Number falls in past yr: 0 - - - -  Injury with Fall? 0 - - - -  Follow up Falls evaluation completed - - - -    FALL RISK PREVENTION PERTAINING TO THE HOME:  Any stairs in or around the home? Yes  If so, are there any without handrails? Yes  Home free of loose throw rugs in walkways, pet beds, electrical cords, etc? Yes  Adequate lighting in your home to reduce risk of falls? Yes   ASSISTIVE DEVICES UTILIZED TO PREVENT FALLS:  Life alert? No  Use of a cane, walker or w/c? Yes  Grab bars in the bathroom? Yes  Shower chair or bench in shower? Yes  Elevated toilet seat or a handicapped toilet? Yes   TIMED UP AND GO:  Was the test performed? No .  Length of time to ambulate 10 feet: No sec.   Gait slow and steady without use of assistive device  Cognitive Function: MMSE - Mini Mental State Exam 04/10/2019 03/21/2018 10/21/2016  Orientation to time 5 5 5  Orientation to Place 5 5 5   Registration 3 3 3   Attention/ Calculation 0 0 0   Recall 3 3 3   Language- name 2 objects 0 0 0  Language- repeat 1 1 1   Language- follow 3 step command 0 3 3  Language- read & follow direction 0 0 0  Write a sentence 0 0 0  Copy design 0 0 0  Total score 17 20 20      6CIT Screen 07/25/2021  What Year? 0 points  What month? 0 points  What time? 0 points  Count back from 20 0 points  Months in reverse 0 points    Immunizations Immunization History  Administered Date(s) Administered   Influenza,inj,Quad PF,6+ Mos 11/25/2015, 10/25/2016, 01/09/2018   PFIZER(Purple Top)SARS-COV-2 Vaccination 05/27/2020, 06/17/2020    TDAP status: Up to date  Flu Vaccine status: Declined, Education has been provided regarding the importance of this vaccine but patient still declined. Advised may receive this vaccine at local pharmacy or Health Dept. Aware to provide a copy of the vaccination record if obtained from local pharmacy or Health Dept. Verbalized acceptance and understanding.  Pneumococcal vaccine status: Declined,  Education has been provided regarding the importance of this vaccine but patient still declined. Advised may receive this vaccine at local pharmacy or Health Dept. Aware to provide a copy of the vaccination record if obtained from local pharmacy or Health Dept. Verbalized acceptance and understanding.   Covid-19 vaccine status: Completed vaccines  Qualifies for Shingles Vaccine? Yes   Zostavax completed No   Shingrix Completed?: No.    Education has been provided regarding the importance of this vaccine. Patient has been advised to call insurance company to determine out of pocket expense if they have not yet received this vaccine. Advised may also receive vaccine at local pharmacy or Health Dept. Verbalized acceptance and understanding.  Screening Tests Health Maintenance  Topic Date Due   Hepatitis C Screening  Never done   MAMMOGRAM  11/15/2018   Zoster Vaccines- Shingrix (1 of 2) Never done   COVID-19 Vaccine (3 -  Booster for Pfizer series) 11/17/2020   INFLUENZA VACCINE  05/31/2021   TETANUS/TDAP  10/21/2026 (Originally 09/18/1990)   COLONOSCOPY (Pts 45-34yrs Insurance coverage will need to be confirmed)  02/10/2027   HIV Screening  Completed   HPV VACCINES  Aged Out    Health Maintenance  Health Maintenance Due  Topic Date Due   Hepatitis C Screening  Never done   MAMMOGRAM  11/15/2018   Zoster Vaccines- Shingrix (1 of 2) Never done   COVID-19 Vaccine (3 - Booster for Pfizer series) 11/17/2020   INFLUENZA VACCINE  05/31/2021    Colorectal cancer screening: Type of screening: Colonoscopy. Completed [id number]. Repeat every 10 years  Mammogram status: Completed 11/15/2017. Repeat every year  Bone Density status: Ordered 07/25/2021. Pt provided with contact info and advised to call to schedule appt.  Lung Cancer Screening: (Low Dose CT Chest recommended if Age 40-80 years, 30 pack-year currently smoking OR have quit w/in 15years.) does qualify.   Lung Cancer Screening Referral: no  Additional Screening:  Hepatitis C Screening: does not qualify; Completed n/a  Vision Screening: Recommended annual ophthalmology exams for early detection of glaucoma and other disorders of the eye. Is the patient up to date with their annual eye exam?  No  Who is the provider or what is the name of the office in which the patient attends annual eye exams? No If pt is not established with a  provider, would they like to be referred to a provider to establish care? No .   Dental Screening: Recommended annual dental exams for proper oral hygiene  Community Resource Referral / Chronic Care Management: CRR required this visit?  No   CCM required this visit?  No      Plan:     I have personally reviewed and noted the following in the patient's chart:   Medical and social history Use of alcohol, tobacco or illicit drugs  Current medications and supplements including opioid prescriptions.  Functional  ability and status Nutritional status Physical activity Advanced directives List of other physicians Hospitalizations, surgeries, and ER visits in previous 12 months Vitals Screenings to include cognitive, depression, and falls Referrals and appointments  In addition, I have reviewed and discussed with patient certain preventive protocols, quality metrics, and best practice recommendations. A written personalized care plan for preventive services as well as general preventive health recommendations were provided to patient.     Venetia Maxon, [facility]   07/25/2021   Nurse Notes: Non Face to Face 35 minutes  Ms. [name] , Thank you for taking time to come for your Medicare Wellness Visit. I appreciate your ongoing commitment to your health goals. Please review the following plan we discussed and let me know if I can assist you in the future.   These are the goals we discussed:  Goals      Increase physical activity     Starting 04/10/2019, I will continue to exercise for 30 minutes daily.         This is a list of the screening recommended for you and due dates:  Health Maintenance  Topic Date Due   Hepatitis C Screening: USPSTF Recommendation to screen - Ages 82-79 yo.  Never done   Mammogram  11/15/2018   Zoster (Shingles) Vaccine (1 of 2) Never done   COVID-19 Vaccine (3 - Booster for Pfizer series) 11/17/2020   Flu Shot  05/31/2021   Tetanus Vaccine  10/21/2026*   Colon Cancer Screening  02/10/2027   HIV Screening  Completed   HPV Vaccine  Aged Out  *Topic was postponed. The date shown is not the original due date.

## 2021-08-12 ENCOUNTER — Encounter: Payer: Self-pay | Admitting: Family Medicine

## 2021-08-12 ENCOUNTER — Ambulatory Visit (INDEPENDENT_AMBULATORY_CARE_PROVIDER_SITE_OTHER): Payer: Medicare Other | Admitting: Family Medicine

## 2021-08-12 ENCOUNTER — Other Ambulatory Visit: Payer: Self-pay

## 2021-08-12 VITALS — BP 136/98 | HR 77 | Temp 97.6°F | Wt 216.0 lb

## 2021-08-12 DIAGNOSIS — R350 Frequency of micturition: Secondary | ICD-10-CM | POA: Diagnosis not present

## 2021-08-12 DIAGNOSIS — N8110 Cystocele, unspecified: Secondary | ICD-10-CM

## 2021-08-12 DIAGNOSIS — R7989 Other specified abnormal findings of blood chemistry: Secondary | ICD-10-CM | POA: Diagnosis not present

## 2021-08-12 DIAGNOSIS — I10 Essential (primary) hypertension: Secondary | ICD-10-CM | POA: Diagnosis not present

## 2021-08-12 LAB — BASIC METABOLIC PANEL
BUN: 16 mg/dL (ref 6–23)
CO2: 31 mEq/L (ref 19–32)
Calcium: 9.5 mg/dL (ref 8.4–10.5)
Chloride: 107 mEq/L (ref 96–112)
Creatinine, Ser: 1.17 mg/dL (ref 0.40–1.20)
GFR: 54.26 mL/min — ABNORMAL LOW (ref >60.00)
Glucose, Bld: 87 mg/dL (ref 70–99)
Potassium: 4.4 mEq/L (ref 3.5–5.1)
Sodium: 141 mEq/L (ref 135–145)

## 2021-08-12 LAB — CBC WITH DIFFERENTIAL/PLATELET
Basophils Absolute: 0 10*3/uL (ref 0.0–0.1)
Basophils Relative: 0.4 % (ref 0.0–3.0)
Eosinophils Absolute: 0 10*3/uL (ref 0.0–0.7)
Eosinophils Relative: 0.7 % (ref 0.0–5.0)
HCT: 40.9 % (ref 36.0–46.0)
Hemoglobin: 13.6 g/dL (ref 12.0–15.0)
Lymphocytes Relative: 27.1 % (ref 12.0–46.0)
Lymphs Abs: 1.6 10*3/uL (ref 0.7–4.0)
MCHC: 33.3 g/dL (ref 30.0–36.0)
MCV: 103.1 fl — ABNORMAL HIGH (ref 78.0–100.0)
Monocytes Absolute: 0.4 10*3/uL (ref 0.1–1.0)
Monocytes Relative: 7.5 % (ref 3.0–12.0)
Neutro Abs: 3.8 10*3/uL (ref 1.4–7.7)
Neutrophils Relative %: 64.3 % (ref 43.0–77.0)
Platelets: 123 10*3/uL — ABNORMAL LOW (ref 150.0–400.0)
RBC: 3.97 Mil/uL (ref 3.87–5.11)
RDW: 14.6 % (ref 11.5–15.5)
WBC: 5.9 10*3/uL (ref 4.0–10.5)

## 2021-08-12 LAB — HEPATIC FUNCTION PANEL
ALT: 16 U/L (ref 0–35)
AST: 17 U/L (ref 0–37)
Albumin: 4.2 g/dL (ref 3.5–5.2)
Alkaline Phosphatase: 42 U/L (ref 39–117)
Bilirubin, Direct: 0.1 mg/dL (ref 0.0–0.3)
Total Bilirubin: 0.3 mg/dL (ref 0.2–1.2)
Total Protein: 6.9 g/dL (ref 6.0–8.3)

## 2021-08-12 LAB — POCT URINALYSIS DIP (MANUAL ENTRY)
Bilirubin, UA: NEGATIVE
Glucose, UA: NEGATIVE mg/dL
Ketones, POC UA: NEGATIVE mg/dL
Leukocytes, UA: NEGATIVE
Nitrite, UA: NEGATIVE
Spec Grav, UA: >=1.03 — AB (ref 1.010–1.025)
Urobilinogen, UA: 0.2 E.U./dL
pH, UA: 5.5 (ref 5.0–8.0)

## 2021-08-12 NOTE — Assessment & Plan Note (Addendum)
With some urge and stress incontinence worse at night Also per pt darker urine  ua notes high SG and large blood (h/o micro hematuria) Also trace protein  Strongly enc to inc water intake to 64 oz if able Urine culture sent to r/o infx Taking mybetriq -no longer helping  May need to return to [facility] urology

## 2021-08-12 NOTE — Assessment & Plan Note (Addendum)
bp in fair control at this time  BP Readings from Last 1 Encounters:  08/12/21 (!) 136/98  bp is improved when feeling better Most recent labs reviewed  Disc lifstyle change with low sodium diet and exercise  Renal panel drawn  Amlodipine 5 mg daily

## 2021-08-12 NOTE — Assessment & Plan Note (Signed)
With some urinary symptoms  Lab today  Enc good fluid intake

## 2021-08-12 NOTE — Progress Notes (Signed)
Subjective:    Patient ID: Dana Greer, female    DOB: December 30, 1970, 50 y.o.   MRN: [id number]  This visit occurred during the SARS-CoV-2 public health emergency.  Safety protocols were in place, including screening questions prior to the visit, additional usage of staff PPE, and extensive cleaning of exam room while observing appropriate contact time as indicated for disinfecting solutions.   HPI Pt presents with urinary symptoms   Wt Readings from Last 3 Encounters:  08/12/21 216 lb (98 kg)  07/08/21 215 lb (97.5 kg)  05/04/21 206 lb 8 oz (93.7 kg)   39.51 kg/m  Urinary frequency  Cloudy urine  It is dark  Urine smelled strange   (it helped drink lemon in water) Drinking lots of fluids  Cannot make it to the bathroom  Has to use a bedside commode now  More spasms -abd No burning to urinate  ? If she has an infection    Has some swelling in her face -mandible and ? Salivary glands No pain  No swollen lymph nodes   Old h/o   She has a h/o bladder prolapse/overactive bladder Has seen urology Past microscopic blood  Myrbetriq- did help but no longer   Ua Results for orders placed or performed in visit on 08/12/21  POCT urinalysis dipstick  Result Value Ref Range   Color, UA yellow yellow   Clarity, UA clear clear   Glucose, UA negative negative mg/dL   Bilirubin, UA negative negative   Ketones, POC UA negative negative mg/dL   Spec Grav, UA >=1.030 (A) 1.010 - 1.025   Blood, UA large (A) negative   pH, UA 5.5 5.0 - 8.0   Protein Ur, POC trace (A) negative mg/dL   Urobilinogen, UA 0.2 0.2 or 1.0 E.U./dL   Nitrite, UA Negative Negative   Leukocytes, UA Negative Negative     Both stress and urge incontinence   Lab Results  Component Value Date   CREATININE 1.39 (H) 10/14/2020   BUN 20 10/14/2020   NA 139 10/14/2020   K 4.0 10/14/2020   CL 106 10/14/2020   CO2 28 10/14/2020    Chronic nausea-improved  Getting headache treatment   BP  Readings from Last 3 Encounters:  08/12/21 (!) 136/98  07/08/21 (!) 150/100  05/04/21 120/74   Taking amlodipine- bp is improved when feeling better  Patient Active Problem List   Diagnosis Date Noted   Elevated serum creatinine 08/12/2021   Urinary frequency 08/12/2021   Herpes zoster without complication 08/65/7846   Hand pain, left 10/14/2020   Swelling of hand joint, left 10/14/2020   TMJ (temporomandibular joint syndrome) 08/18/2020   AKI (acute kidney injury) ([place]) 05/15/2020   Obesity, Class II, BMI 35-39.9 05/15/2020   Non-intractable cyclical vomiting 96/29/5284   Left-sided back pain 02/25/2020   Hx of adenomatous polyp of colon 02/14/2020   Seasonal allergic rhinitis 01/21/2020   Chronic cough 10/04/2019   Headache, post-traumatic, acute 01/28/2019   Head injury, closed 01/17/2019   Abnormal vaginal bleeding 07/06/2018   Neck pain 06/25/2018   Chronic migraine 04/12/2018   B12 deficiency 01/10/2018   Foot pain, bilateral 01/09/2018   Hand paresthesia 01/09/2018   Vitamin D deficiency 01/09/2018   Upper abdominal pain 11/22/2016   Chronic myofascial pain 10/26/2016   HSV-2 infection 08/17/2016   Adverse effects of medication 11/25/2015   Vertigo 10/08/2015   Carpal tunnel syndrome 03/16/2015   Gout 12/22/2014   Vertiginous migraine 02/03/2014   Intractable  migraine 08/31/2012   Osteoarthritis 05/11/2012   Female bladder prolapse 05/11/2012   ADJ DISORDER WITH MIXED ANXIETY & DEPRESSED MOOD 06/30/2008   HYPERHIDROSIS 01/25/2008   BACK PAIN, LUMBAR, CHRONIC 10/16/2007   REACTION, ACUTE STRESS W/EMOTIONAL DSTURB 08/01/2007   Prediabetes 08/01/2007   Essential hypertension 08/01/2007   Goiter 07/11/2007   POLYCYSTIC OVARIAN DISEASE 05/24/2007   HIRSUTISM 05/24/2007   Arthropathy, multiple sites 05/24/2007   Past Medical History:  Diagnosis Date   Arthritis    Depression    Fibromyalgia    Headache(784.0)    Hx of adenomatous polyp of colon 02/14/2020    Hypertension    Immune deficiency disorder (HCC)    Migraine headache    Shingles    Stroke ([place])    Thrombocytopenia ([place]) 10/01/2012   Past Surgical History:  Procedure Laterality Date   ABDOMINAL HYSTERECTOMY     CARPAL TUNNEL RELEASE Left 09/25/2018   Procedure: LEFT CARPAL TUNNEL RELEASE;  Surgeon: Daryll Brod, MD;  Location: [facility];  Service: Orthopedics;  Laterality: Left;   CESAREAN SECTION  2008   x1   PARTIAL HYSTERECTOMY  2012   due to fibroid   UPPER GASTROINTESTINAL ENDOSCOPY     WISDOM TOOTH EXTRACTION  1995   Social History   Tobacco Use   Smoking status: Never   Smokeless tobacco: Never  Vaping Use   Vaping Use: Never used  Substance Use Topics   Alcohol use: Not Currently    Comment: occasional   Drug use: Not Currently    Types: Marijuana    Comment: smokes small amount twice daily   Family History  Problem Relation Age of Onset   Diabetes Father    COPD Father    Healthy Mother    Breast cancer Paternal Aunt    Colon cancer Paternal Grandmother    Rectal cancer Paternal Grandmother    Lung cancer Paternal Uncle    Lung cancer Paternal Aunt    Esophageal cancer Cousin    Stomach cancer Neg Hx    Allergies  Allergen Reactions   Trazodone And Nefazodone Nausea And Vomiting   Amoxicillin Other (See Comments)    Yeast infection    Carafate [Sucralfate] Nausea And Vomiting   Current Outpatient Medications on File Prior to Visit  Medication Sig Dispense Refill   ALPRAZolam (XANAX) 1 MG tablet Take 1 mg by mouth 4 (four) times daily as needed for anxiety or sleep.      amitriptyline (ELAVIL) 25 MG tablet Take 2 tablets (50 mg total) by mouth daily. 60 tablet 0   amLODipine (NORVASC) 5 MG tablet TAKE 1 TABLET BY MOUTH ONCE DAILY 30 tablet 3   capsaicin (ZOSTRIX) 0.025 % cream Apply topically 2 (two) times daily. 60 g 0   cloNIDine (CATAPRES) 0.2 MG tablet Take 0.2 mg by mouth 2 (two) times daily.     etonogestrel (NEXPLANON) 68  MG IMPL implant Inject 1 each into the skin continuous. Reported on 01/27/2016     famotidine (PEPCID) 20 MG tablet TAKE 1 TABLET BY MOUTH 2 TIMES DAILY 60 tablet 11   fluticasone (FLONASE) 50 MCG/ACT nasal spray Place 2 sprays into both nostrils daily. 16 g 11   gabapentin (NEURONTIN) 600 MG tablet Take 1 tablet (600 mg total) by mouth 3 (three) times daily. 270 tablet 1   Galcanezumab-gnlm (EMGALITY [place]) Inject into the skin.     MYRBETRIQ 25 MG TB24 tablet TAKE 1 TABLET BY MOUTH ONCE A DAY 90 tablet  1   ondansetron (ZOFRAN) 8 MG tablet Take 8 mg by mouth 3 (three) times daily.     pantoprazole (PROTONIX) 40 MG tablet Take 1 tablet (40 mg total) by mouth daily. 30 tablet 0   PARoxetine (PAXIL) 40 MG tablet Take 1 tablet (40 mg total) by mouth 2 (two) times daily. 60 tablet 0   promethazine (PHENERGAN) 25 MG tablet TAKE 1 TABLET BY MOUTH EVERY 8 HOURS AS NEEDED FOR NAUSEA AND VOMITING 30 tablet 5   PULMICORT FLEXHALER 90 MCG/ACT inhaler Inhale 1-2 puffs into the lungs daily.     scopolamine (TRANSDERM-SCOP, 1.5 MG,) 1 MG/3DAYS Place 1 patch (1.5 mg total) onto the skin every 3 (three) days. 5 patch 1   tiZANidine (ZANAFLEX) 4 MG tablet Take by mouth.     topiramate (TOPAMAX) 100 MG tablet TAKE 1 TABLET BY MOUTH TWICE A DAY 60 tablet 5   Ubrogepant (UBRELVY PO) Take by mouth.     valACYclovir (VALTREX) 1000 MG tablet Take 1 tablet (1,000 mg total) by mouth 3 (three) times daily. 21 tablet 0   valACYclovir (VALTREX) 500 MG tablet TAKE 1 TABLET BY MOUTH ONCE DAILY 30 tablet 5   No current facility-administered medications on file prior to visit.     Review of Systems  Constitutional:  Negative for activity change, appetite change, fatigue, fever and unexpected weight change.  HENT:  Negative for congestion, ear pain, rhinorrhea, sinus pressure and sore throat.   Eyes:  Negative for pain, redness and visual disturbance.  Respiratory:  Negative for cough, shortness of breath and wheezing.    Cardiovascular:  Negative for chest pain and palpitations.  Gastrointestinal:  Negative for abdominal pain, blood in stool, constipation and diarrhea.  Endocrine: Negative for polydipsia and polyuria.  Genitourinary:  Positive for flank pain, frequency and urgency. Negative for decreased urine volume, dysuria, vaginal bleeding and vaginal discharge.  Musculoskeletal:  Negative for arthralgias, back pain and myalgias.  Skin:  Negative for pallor and rash.  Allergic/Immunologic: Negative for environmental allergies.  Neurological:  Negative for dizziness, syncope and headaches.  Hematological:  Negative for adenopathy. Does not bruise/bleed easily.  Psychiatric/Behavioral:  Negative for decreased concentration and dysphoric mood. The patient is not nervous/anxious.       Objective:   Physical Exam Constitutional:      General: She is not in acute distress.    Appearance: Normal appearance. She is obese. She is not ill-appearing or diaphoretic.  HENT:     Head: Normocephalic and atraumatic.  Eyes:     General: No scleral icterus.    Conjunctiva/sclera: Conjunctivae normal.     Pupils: Pupils are equal, round, and reactive to light.  Cardiovascular:     Rate and Rhythm: Normal rate and regular rhythm.     Heart sounds: Normal heart sounds.  Pulmonary:     Effort: Pulmonary effort is normal. No respiratory distress.     Breath sounds: Normal breath sounds. No wheezing.  Abdominal:     General: Abdomen is flat and protuberant. Bowel sounds are normal. There is no distension or abdominal bruit.     Palpations: Abdomen is soft. There is no hepatomegaly, splenomegaly or pulsatile mass.     Tenderness: There is no guarding or rebound.     Comments: No suprapubic tenderness or fullness   Baseline cva areas tender from back problems  Musculoskeletal:     Cervical back: Neck supple.     Comments: Scoliosis   Lymphadenopathy:  Cervical: No cervical adenopathy.  Skin:    Coloration:  Skin is not pale.     Findings: No erythema or rash.  Neurological:     Mental Status: She is alert.     Sensory: No sensory deficit.     Coordination: Coordination normal.     Deep Tendon Reflexes: Reflexes normal.  Psychiatric:        Mood and Affect: Mood normal.          Assessment & Plan:   Problem List Items Addressed This Visit       Cardiovascular and Mediastinum   Essential hypertension    bp in fair control at this time  BP Readings from Last 1 Encounters:  08/12/21 (!) 136/98  bp is improved when feeling better Most recent labs reviewed  Disc lifstyle change with low sodium diet and exercise  Renal panel drawn  Amlodipine 5 mg daily      Relevant Orders   Basic metabolic panel (Completed)   CBC with Differential/Platelet (Completed)   Hepatic function panel (Completed)     Genitourinary   Female bladder prolapse    Worsening incontinence (urge and stress)  Urine culture pending          Other   Elevated serum creatinine    With some urinary symptoms  Lab today  Enc good fluid intake      Relevant Orders   Basic metabolic panel (Completed)   CBC with Differential/Platelet (Completed)   Urinary frequency - Primary    With some urge and stress incontinence worse at night Also per pt darker urine  ua notes high SG and large blood (h/o micro hematuria) Also trace protein  Strongly enc to inc water intake to 64 oz if able Urine culture sent to r/o infx Taking mybetriq -no longer helping  May need to return to [facility] urology        Relevant Orders   POCT urinalysis dipstick (Completed)   Urine Culture

## 2021-08-12 NOTE — Assessment & Plan Note (Signed)
Worsening incontinence (urge and stress)  Urine culture pending

## 2021-08-12 NOTE — Patient Instructions (Addendum)
Drink fluids  I want to get your urine more dilute  Aim for at least 64 oz per day   Let's get a urine culture  We will call you   Let's check labs for kidney function

## 2021-08-13 LAB — URINE CULTURE
MICRO NUMBER:: 12499178
SPECIMEN QUALITY:: ADEQUATE

## 2021-08-15 ENCOUNTER — Encounter: Payer: Self-pay | Admitting: Family Medicine

## 2021-08-15 DIAGNOSIS — R350 Frequency of micturition: Secondary | ICD-10-CM

## 2021-08-15 DIAGNOSIS — N3946 Mixed incontinence: Secondary | ICD-10-CM

## 2021-08-16 DIAGNOSIS — N3946 Mixed incontinence: Secondary | ICD-10-CM | POA: Insufficient documentation

## 2021-08-19 ENCOUNTER — Ambulatory Visit: Payer: Medicare Other | Admitting: Family Medicine

## 2021-08-19 DIAGNOSIS — M5417 Radiculopathy, lumbosacral region: Secondary | ICD-10-CM | POA: Diagnosis not present

## 2021-08-19 DIAGNOSIS — M542 Cervicalgia: Secondary | ICD-10-CM | POA: Diagnosis not present

## 2021-08-19 DIAGNOSIS — Z79899 Other long term (current) drug therapy: Secondary | ICD-10-CM | POA: Diagnosis not present

## 2021-08-19 DIAGNOSIS — G5603 Carpal tunnel syndrome, bilateral upper limbs: Secondary | ICD-10-CM | POA: Diagnosis not present

## 2021-08-19 DIAGNOSIS — G43711 Chronic migraine without aura, intractable, with status migrainosus: Secondary | ICD-10-CM | POA: Diagnosis not present

## 2021-08-24 ENCOUNTER — Ambulatory Visit (INDEPENDENT_AMBULATORY_CARE_PROVIDER_SITE_OTHER): Payer: Medicare Other | Admitting: Psychology

## 2021-08-24 DIAGNOSIS — F331 Major depressive disorder, recurrent, moderate: Secondary | ICD-10-CM

## 2021-08-25 ENCOUNTER — Other Ambulatory Visit: Payer: Self-pay | Admitting: Family Medicine

## 2021-08-26 NOTE — Telephone Encounter (Signed)
Looks like med was d/c from med list in "error" at last [place] on 08/12/21. Last filled by Dr. Lorelei Pont on 07/08/21 #30 tabs with 1 refill, please advise

## 2021-08-26 NOTE — Telephone Encounter (Signed)
Last OV was ? UTI on 07/1321,   Gabapentin last filled on 01/25/21 #270 caps with 1 refill  Phenergan last filled on 02/23/21 #30 tab with 5 refills  Valtrex last filled on 02/23/21 #30 tabs with 5 refills    Please advise

## 2021-08-30 ENCOUNTER — Encounter: Payer: Self-pay | Admitting: Family Medicine

## 2021-08-30 ENCOUNTER — Ambulatory Visit (INDEPENDENT_AMBULATORY_CARE_PROVIDER_SITE_OTHER): Payer: Medicare Other | Admitting: Family Medicine

## 2021-08-30 ENCOUNTER — Other Ambulatory Visit: Payer: Self-pay

## 2021-08-30 ENCOUNTER — Ambulatory Visit (INDEPENDENT_AMBULATORY_CARE_PROVIDER_SITE_OTHER)
Admission: RE | Admit: 2021-08-30 | Discharge: 2021-08-30 | Disposition: A | Discharge status: Home / Self Care | Payer: Medicare Other | Source: Ambulatory Visit | Attending: Family Medicine | Admitting: Family Medicine

## 2021-08-30 VITALS — BP 124/84 | HR 51 | Temp 98.0°F | Ht 62.0 in | Wt 220.1 lb

## 2021-08-30 DIAGNOSIS — G8929 Other chronic pain: Secondary | ICD-10-CM

## 2021-08-30 DIAGNOSIS — M549 Dorsalgia, unspecified: Secondary | ICD-10-CM

## 2021-08-30 DIAGNOSIS — M545 Low back pain, unspecified: Secondary | ICD-10-CM | POA: Diagnosis not present

## 2021-08-30 DIAGNOSIS — M5416 Radiculopathy, lumbar region: Secondary | ICD-10-CM | POA: Diagnosis not present

## 2021-08-30 NOTE — Progress Notes (Signed)
Dana Greer T. Dana Simmonds, MD, [place] at [facility] [place], [address]  Phone: [phone]  FAX: [place] - 50 y.o. female  MRN [id number]  Date of Birth: 1971/03/26  Date: 08/30/2021  PCP: Abner Greenspan, MD  Referral: Abner Greenspan, MD  Chief Complaint  Patient presents with  . Follow-up    Lumbar Radiculopathy    This visit occurred during the SARS-CoV-2 public health emergency.  Safety protocols were in place, including screening questions prior to the visit, additional usage of staff PPE, and extensive cleaning of exam room while observing appropriate contact time as indicated for disinfecting solutions.   Subjective:   Dana Greer is a 50 y.o. very pleasant female patient with Body mass index is 40.26 kg/m. who presents with the following:  Ongoing back pain with radiculopathy.  On her last OV, I gave her 14 d of prednisone and some flexeril.  Multi-year back pain. Now worse.  On chart review with the patient and in discussion, she has had some chronic back pain to some degree from the neck, thoracic, and lumbosacral aspects of her spine for many years.  She is seen Dr. Ninfa Linden, Dr. Junius Roads, as well as several neurologist along with myself.  More pain and having a hard time sitting down and lying on her side.  Even going to the supermarket  Referred L buttocks pain.   She has done all manner of conservative care including physical therapy, chiropractic, ice, heat, all manner of oral medications including steroids, NSAIDs, neuropathic pain agents at a relatively high level, none of this is really provide any kind of long-term significant relief.  Pain now below the knee.   Nothing makes it feel better.  Resting during the day.  - lying down much of the day  Has seen Dr. Trula Ore in the spine.   Has not done any ESI.  - declined when offered in the past and she has been  worried about complications.  On her prior thoracic spine MRI from June 2021, there was multilevel degenerative disc disease and arthropathy.  There also was some evidence of some bony edema at T11-T12, with possible concern for stress reaction versus high level of bony arthropathy.  Review of Systems is noted in the HPI, as appropriate   Patient Active Problem List   Diagnosis Date Noted  . Mixed incontinence 08/16/2021  . Elevated serum creatinine 08/12/2021  . Urinary frequency 08/12/2021  . Herpes zoster without complication 13/24/4010  . Hand pain, left 10/14/2020  . Swelling of hand joint, left 10/14/2020  . TMJ (temporomandibular joint syndrome) 08/18/2020  . AKI (acute kidney injury) ([place]) 05/15/2020  . Obesity, Class II, BMI 35-39.9 05/15/2020  . Non-intractable cyclical vomiting 27/25/3664  . Left-sided back pain 02/25/2020  . Hx of adenomatous polyp of colon 02/14/2020  . Seasonal allergic rhinitis 01/21/2020  . Chronic cough 10/04/2019  . Headache, post-traumatic, acute 01/28/2019  . Head injury, closed 01/17/2019  . Abnormal vaginal bleeding 07/06/2018  . Neck pain 06/25/2018  . Chronic migraine 04/12/2018  . B12 deficiency 01/10/2018  . Foot pain, bilateral 01/09/2018  . Hand paresthesia 01/09/2018  . Vitamin D deficiency 01/09/2018  . Upper abdominal pain 11/22/2016  . Chronic myofascial pain 10/26/2016  . HSV-2 infection 08/17/2016  . Adverse effects of medication 11/25/2015  . Vertigo 10/08/2015  . Carpal tunnel syndrome 03/16/2015  . Gout 12/22/2014  . Vertiginous  migraine 02/03/2014  . Intractable migraine 08/31/2012  . Osteoarthritis 05/11/2012  . Female bladder prolapse 05/11/2012  . ADJ DISORDER WITH MIXED ANXIETY & DEPRESSED MOOD 06/30/2008  . HYPERHIDROSIS 01/25/2008  . BACK PAIN, LUMBAR, CHRONIC 10/16/2007  . REACTION, ACUTE STRESS W/EMOTIONAL DSTURB 08/01/2007  . Prediabetes 08/01/2007  . Essential hypertension 08/01/2007  . Goiter 07/11/2007   . POLYCYSTIC OVARIAN DISEASE 05/24/2007  . HIRSUTISM 05/24/2007  . Arthropathy, multiple sites 05/24/2007    Past Medical History:  Diagnosis Date  . Arthritis   . Depression   . Fibromyalgia   . Headache(784.0)   . Hx of adenomatous polyp of colon 02/14/2020  . Hypertension   . Immune deficiency disorder ([employer])   . Migraine headache   . Shingles   . Stroke ([place])   . Thrombocytopenia ([place]) 10/01/2012    Past Surgical History:  Procedure Laterality Date  . ABDOMINAL HYSTERECTOMY    . CARPAL TUNNEL RELEASE Left 09/25/2018   Procedure: LEFT CARPAL TUNNEL RELEASE;  Surgeon: Daryll Brod, MD;  Location: [facility];  Service: Orthopedics;  Laterality: Left;  . CESAREAN SECTION  2008   x1  . PARTIAL HYSTERECTOMY  2012   due to fibroid  . UPPER GASTROINTESTINAL ENDOSCOPY    . WISDOM TOOTH EXTRACTION  1995    Family History  Problem Relation Age of Onset  . Diabetes Father   . COPD Father   . Healthy Mother   . Breast cancer Paternal Aunt   . Colon cancer Paternal Grandmother   . Rectal cancer Paternal Grandmother   . Lung cancer Paternal Uncle   . Lung cancer Paternal Aunt   . Esophageal cancer Cousin   . Stomach cancer Neg Hx      Objective:   BP 124/84   Pulse (!) 51   Temp 98 F (36.7 C) (Temporal)   Ht 5\' 2"  (1.575 m)   Wt 220 lb 2 oz (99.8 kg)   LMP 10/16/2019 (Within Days) Comment: had hysterectomy-still has half of her uterus and ovaries  SpO2 96%   BMI 40.26 kg/m    Range of motion at  the waist: Flexion: normal Extension: normal Lateral bending: normal Rotation: all normal  No echymosis or edema Rises to examination table with no difficulty Gait: non antalgic  Inspection/Deformity: N Paraspinus Tenderness: L3-S1, left greater than right  B Ankle Dorsiflexion (L5,4): 5/5 B Great Toe Dorsiflexion (L5,4): 5/5 Heel Walk (L5): WNL Toe Walk (S1): WNL Rise/Squat (L4): WNL  SENSORY B Medial Foot (L4): WNL B Dorsum (L5): WNL B  Lateral (S1): WNL Light Touch: WNL Pinprick: WNL  REFLEXES Knee (L4): 2+ Ankle (S1): 2+  B SLR, seated: neg B SLR, supine: neg B FABER: neg B Reverse FABER: neg B Greater Troch: Mildly tender to palpation  B Log Roll: neg B Sciatic Notch: NT   Radiology: CLINICAL DATA:  Mid back pain and low back pain. Left-sided abdominal pain and numbness. Left groin and leg pain.   EXAM: MRI THORACIC SPINE WITHOUT CONTRAST   TECHNIQUE: Multiplanar, multisequence MR imaging of the thoracic spine was performed. No intravenous contrast was administered.   COMPARISON:  Chest radiographs 01/21/2020. CT abdomen and pelvis 01/17/2020.   FINDINGS: Alignment: Severe dextroscoliosis with apex at T8. No listhesis in the sagittal plane.   Vertebrae: Left facet edema at T11-12 extending into the pedicles as well as the T11 lamina and spinous process and into the right T11 inferior articular process with underlying advanced facet arthropathy at these  levels. No displaced fracture identified. Preserved vertebral body heights. No suspicious marrow lesion.   Cord:  Normal signal and morphology.   Paraspinal and other soft tissues: Unremarkable.   Disc levels:   Mild disc bulging throughout the mid and lower thoracic spine without spinal stenosis. Widespread thoracic facet arthrosis, particularly severe on the right from T3-4 to T6-7 and on the left from T9-10 to T11-12. Resultant moderate neural foraminal stenosis on the right at T3-4 and T4-5 and on the left at T9-10 and T10-11.   IMPRESSION: 1. Severe thoracic dextroscoliosis. 2. Left greater than right posterior element edema at T11-12 favored to be secondary to advanced facet arthropathy and stress reaction. If there is an unreported history of trauma, thoracic spine CT could be performed to exclude an underlying fracture. 3. Mild multilevel disc bulging without spinal stenosis. 4. Advanced multilevel facet arthrosis with associated  moderate neural foraminal stenosis as above.     Electronically Signed   By: Logan Bores M.D.   On: 04/10/2020 08:04  Assessment and Plan:     ICD-10-CM   1. Lumbar radiculopathy, acute  M54.16 DG Lumbar Spine Complete    MR Lumbar Spine Wo Contrast    2. Chronic left-sided back pain, unspecified back location  M54.9 DG Lumbar Spine Complete   G89.29 MR Lumbar Spine Wo Contrast     Known thoracolumbar degenerative disc disease with relatively advanced arthropathy. Prior MRI of the thoracic spine was reviewed by myself.  This does not include the lowest aspect of the lumbar spine which would be expected with her lumbar radiculopathy on the left.  She has failed all manner of conservative care including Tylenol, NSAIDs, oral prednisone, physical therapy chiropractic care, multiple other over-the-counter regimens including herbals, as well as high doses of neuropathic pain medication.  None of this is helped, and she is feeling worse today and recently compared to where she has been over years.    Obtain an MRI of the lumbar spine to evaluate for spinal stenosis, foraminal stenosis, or other nerve and or spine impairment.  Evaluate for disc herniation.  Social: This is limiting almost every aspect of her life let alone exercise.  Orders Placed This Encounter  Procedures  . DG Lumbar Spine Complete  . MR Lumbar Spine Wo Contrast    Follow-up: No follow-ups on file.  [employer] One speech-to-text software was used for transcription in this dictation.  Possible transcriptional errors can occur using Editor, commissioning.   Signed,  Dana Deed. Kaymen Adrian, MD   Outpatient Encounter Medications as of 08/30/2021  Medication Sig  . ALPRAZolam (XANAX) 1 MG tablet Take 1 mg by mouth 4 (four) times daily as needed for anxiety or sleep.   Marland Kitchen amitriptyline (ELAVIL) 25 MG tablet Take 2 tablets (50 mg total) by mouth daily.  Marland Kitchen amLODipine (NORVASC) 5 MG tablet TAKE 1 TABLET BY MOUTH ONCE DAILY   . capsaicin (ZOSTRIX) 0.025 % cream Apply topically 2 (two) times daily.  . cloNIDine (CATAPRES) 0.2 MG tablet Take 0.2 mg by mouth 2 (two) times daily.  . cyclobenzaprine (FLEXERIL) 10 MG tablet Take 1 tablet (10 mg total) by mouth at bedtime as needed for muscle spasms.  Marland Kitchen etonogestrel (NEXPLANON) 68 MG IMPL implant Inject 1 each into the skin continuous. Reported on 01/27/2016  . famotidine (PEPCID) 20 MG tablet TAKE 1 TABLET BY MOUTH 2 TIMES DAILY  . fluticasone (FLONASE) 50 MCG/ACT nasal spray Place 2 sprays into both nostrils daily.  Marland Kitchen gabapentin (NEURONTIN)  600 MG tablet TAKE 1 TABLET BY MOUTH 3 TIMES DAILY  . Galcanezumab-gnlm (EMGALITY [place]) Inject into the skin.  Marland Kitchen MYRBETRIQ 25 MG TB24 tablet TAKE 1 TABLET BY MOUTH ONCE A DAY  . ondansetron (ZOFRAN) 8 MG tablet Take 8 mg by mouth 3 (three) times daily.  . promethazine (PHENERGAN) 25 MG tablet TAKE 1 TABLET BY MOUTH EVERY 8 HOURS AS NEEDED FOR NAUSEA AND VOMITING  . PULMICORT FLEXHALER 90 MCG/ACT inhaler Inhale 1-2 puffs into the lungs daily.  Marland Kitchen scopolamine (TRANSDERM-SCOP, 1.5 MG,) 1 MG/3DAYS Place 1 patch (1.5 mg total) onto the skin every 3 (three) days.  Marland Kitchen tiZANidine (ZANAFLEX) 4 MG tablet Take by mouth.  . topiramate (TOPAMAX) 100 MG tablet TAKE 1 TABLET BY MOUTH TWICE A DAY  . Ubrogepant (UBRELVY PO) Take by mouth.  . valACYclovir (VALTREX) 500 MG tablet TAKE 1 TABLET BY MOUTH ONCE DAILY  . pantoprazole (PROTONIX) 40 MG tablet Take 1 tablet (40 mg total) by mouth daily.  Marland Kitchen PARoxetine (PAXIL) 40 MG tablet Take 1 tablet (40 mg total) by mouth 2 (two) times daily.   No facility-administered encounter medications on file as of 08/30/2021.

## 2021-09-06 DIAGNOSIS — M5416 Radiculopathy, lumbar region: Secondary | ICD-10-CM

## 2021-09-06 DIAGNOSIS — M5126 Other intervertebral disc displacement, lumbar region: Secondary | ICD-10-CM

## 2021-09-09 ENCOUNTER — Other Ambulatory Visit: Payer: Self-pay

## 2021-09-09 ENCOUNTER — Ambulatory Visit
Admission: RE | Admit: 2021-09-09 | Discharge: 2021-09-09 | Disposition: A | Discharge status: Home / Self Care | Payer: Medicare Other | Source: Ambulatory Visit | Attending: Family Medicine | Admitting: Family Medicine

## 2021-09-09 DIAGNOSIS — M5416 Radiculopathy, lumbar region: Secondary | ICD-10-CM

## 2021-09-09 DIAGNOSIS — M545 Low back pain, unspecified: Secondary | ICD-10-CM | POA: Diagnosis not present

## 2021-09-09 DIAGNOSIS — G8929 Other chronic pain: Secondary | ICD-10-CM

## 2021-09-09 DIAGNOSIS — M48061 Spinal stenosis, lumbar region without neurogenic claudication: Secondary | ICD-10-CM | POA: Diagnosis not present

## 2021-09-13 NOTE — Telephone Encounter (Signed)
Noted  

## 2021-09-13 NOTE — Telephone Encounter (Signed)
Pt call wanted to leave a temporary number if needed to be reach #[phone]

## 2021-09-15 DIAGNOSIS — R2231 Localized swelling, mass and lump, right upper limb: Secondary | ICD-10-CM | POA: Diagnosis not present

## 2021-09-15 DIAGNOSIS — R202 Paresthesia of skin: Secondary | ICD-10-CM | POA: Diagnosis not present

## 2021-09-15 DIAGNOSIS — R200 Anesthesia of skin: Secondary | ICD-10-CM | POA: Diagnosis not present

## 2021-09-15 NOTE — Telephone Encounter (Signed)
Ashtyn, can you help make sure they get records.  I already put in a NSG consult, so I think that is all we need to do - and double check that she has an appointment.    Good morning Doctor, I found a neurosurgeon.  Berneta Levins MD, PhD Office Number [phone] Fax Number [phone]

## 2021-09-16 NOTE — Addendum Note (Signed)
Addended by: Owens Loffler on: 09/16/2021 04:19 PM   Modules accepted: Orders

## 2021-09-25 ENCOUNTER — Other Ambulatory Visit: Payer: Self-pay | Admitting: Family Medicine

## 2021-09-28 NOTE — Telephone Encounter (Signed)
See mychart she sent to Dr. Lorelei Pont on 09/06/21 regarding taking Flexeril looks like she is till taking flexeril. It looks like at some point Dr. Lorelei Pont prescribed flexeril and Dr. Glori Bickers refilled it on 08/26/21. And at the time Dr. Lorelei Pont gave her flexeril he took zanaflex off med list but it has been added back as an historical entry.  Looking at past refills Zanaflex last filled on 05/19/21 #30 tabs with 3 refills

## 2021-09-29 ENCOUNTER — Encounter: Payer: Self-pay | Admitting: Family Medicine

## 2021-09-29 NOTE — Telephone Encounter (Signed)
Pt sent a mychart message back saying she is no longer on the flexeril and wants PCP to prescribe her normal med the zanaflex, please advise

## 2021-09-30 DIAGNOSIS — M5412 Radiculopathy, cervical region: Secondary | ICD-10-CM | POA: Diagnosis not present

## 2021-09-30 DIAGNOSIS — G43711 Chronic migraine without aura, intractable, with status migrainosus: Secondary | ICD-10-CM | POA: Diagnosis not present

## 2021-09-30 DIAGNOSIS — M5417 Radiculopathy, lumbosacral region: Secondary | ICD-10-CM | POA: Diagnosis not present

## 2021-09-30 DIAGNOSIS — M542 Cervicalgia: Secondary | ICD-10-CM | POA: Diagnosis not present

## 2021-10-05 MED ORDER — CYCLOBENZAPRINE HCL 10 MG PO TABS: 10.0000 mg | ORAL_TABLET | Freq: Every evening | ORAL | 3 refills | Status: AC | PRN

## 2021-10-05 NOTE — Telephone Encounter (Signed)
See mychart message, we sent her a message asking if she was taking flexeril or Zanaflex and 1st message says she wasn't taking flexeril so PCP refilled the zanaflex on 09/29/21, now she is saying she wants a refill of flexeril and what she said prev was wrong

## 2021-10-05 NOTE — Telephone Encounter (Signed)
[place] and pt hasn't picked up the Zanaflex so Rx was cancelled with pharmacist, will route back to PCP for approval of flexeril

## 2021-10-06 ENCOUNTER — Encounter (HOSPITAL_COMMUNITY): Payer: Self-pay

## 2021-10-06 ENCOUNTER — Other Ambulatory Visit: Payer: Self-pay

## 2021-10-06 ENCOUNTER — Emergency Department (HOSPITAL_COMMUNITY)
Admission: EM | Admit: 2021-10-06 | Discharge: 2021-10-06 | Disposition: A | Discharge status: Home / Self Care | Payer: Medicare Other | Attending: Emergency Medicine | Admitting: Emergency Medicine

## 2021-10-06 ENCOUNTER — Telehealth: Payer: Self-pay | Admitting: Family Medicine

## 2021-10-06 DIAGNOSIS — Z20822 Contact with and (suspected) exposure to covid-19: Secondary | ICD-10-CM | POA: Diagnosis not present

## 2021-10-06 DIAGNOSIS — R202 Paresthesia of skin: Secondary | ICD-10-CM | POA: Diagnosis not present

## 2021-10-06 DIAGNOSIS — R112 Nausea with vomiting, unspecified: Secondary | ICD-10-CM | POA: Diagnosis not present

## 2021-10-06 DIAGNOSIS — R519 Headache, unspecified: Secondary | ICD-10-CM | POA: Diagnosis not present

## 2021-10-06 DIAGNOSIS — R109 Unspecified abdominal pain: Secondary | ICD-10-CM | POA: Diagnosis not present

## 2021-10-06 DIAGNOSIS — G4489 Other headache syndrome: Secondary | ICD-10-CM | POA: Diagnosis not present

## 2021-10-06 DIAGNOSIS — I10 Essential (primary) hypertension: Secondary | ICD-10-CM | POA: Diagnosis not present

## 2021-10-06 DIAGNOSIS — R1084 Generalized abdominal pain: Secondary | ICD-10-CM | POA: Diagnosis not present

## 2021-10-06 DIAGNOSIS — R110 Nausea: Secondary | ICD-10-CM | POA: Diagnosis not present

## 2021-10-06 DIAGNOSIS — R42 Dizziness and giddiness: Secondary | ICD-10-CM | POA: Diagnosis not present

## 2021-10-06 DIAGNOSIS — Z5321 Procedure and treatment not carried out due to patient leaving prior to being seen by health care provider: Secondary | ICD-10-CM | POA: Diagnosis not present

## 2021-10-06 DIAGNOSIS — M79603 Pain in arm, unspecified: Secondary | ICD-10-CM | POA: Diagnosis not present

## 2021-10-06 DIAGNOSIS — R739 Hyperglycemia, unspecified: Secondary | ICD-10-CM | POA: Diagnosis not present

## 2021-10-06 LAB — COMPREHENSIVE METABOLIC PANEL
ALT: 18 U/L (ref 0–44)
AST: 18 U/L (ref 15–41)
Albumin: 5 g/dL (ref 3.5–5.0)
Alkaline Phosphatase: 48 U/L (ref 38–126)
Anion gap: 11 (ref 5–15)
BUN: 10 mg/dL (ref 6–20)
CO2: 23 mmol/L (ref 22–32)
Calcium: 9.9 mg/dL (ref 8.9–10.3)
Chloride: 103 mmol/L (ref 98–111)
Creatinine, Ser: 0.9 mg/dL (ref 0.44–1.00)
GFR, Estimated: >60 mL/min (ref >60)
Glucose, Bld: 160 mg/dL — ABNORMAL HIGH (ref 70–99)
Potassium: 3.5 mmol/L (ref 3.5–5.1)
Sodium: 137 mmol/L (ref 135–145)
Total Bilirubin: 1.3 mg/dL — ABNORMAL HIGH (ref 0.3–1.2)
Total Protein: 8.3 g/dL — ABNORMAL HIGH (ref 6.5–8.1)

## 2021-10-06 LAB — LIPASE, BLOOD: Lipase: 23 U/L (ref 11–51)

## 2021-10-06 LAB — CBC WITH DIFFERENTIAL/PLATELET
Abs Immature Granulocytes: 0.04 10*3/uL (ref 0.00–0.07)
Basophils Absolute: 0 10*3/uL (ref 0.0–0.1)
Basophils Relative: 0 %
Eosinophils Absolute: 0 10*3/uL (ref 0.0–0.5)
Eosinophils Relative: 0 %
HCT: 44.8 % (ref 36.0–46.0)
Hemoglobin: 15.2 g/dL — ABNORMAL HIGH (ref 12.0–15.0)
Immature Granulocytes: 0 %
Lymphocytes Relative: 7 %
Lymphs Abs: 0.8 10*3/uL (ref 0.7–4.0)
MCH: 34.2 pg — ABNORMAL HIGH (ref 26.0–34.0)
MCHC: 33.9 g/dL (ref 30.0–36.0)
MCV: 100.9 fL — ABNORMAL HIGH (ref 80.0–100.0)
Monocytes Absolute: 0.5 10*3/uL (ref 0.1–1.0)
Monocytes Relative: 4 %
Neutro Abs: 9.6 10*3/uL — ABNORMAL HIGH (ref 1.7–7.7)
Neutrophils Relative %: 89 %
Platelets: 152 10*3/uL (ref 150–400)
RBC: 4.44 MIL/uL (ref 3.87–5.11)
RDW: 13.4 % (ref 11.5–15.5)
WBC: 10.9 10*3/uL — ABNORMAL HIGH (ref 4.0–10.5)
nRBC: 0 % (ref 0.0–0.2)

## 2021-10-06 LAB — RESP PANEL BY RT-PCR (FLU A&B, COVID) ARPGX2
Influenza A by PCR: NEGATIVE
Influenza B by PCR: NEGATIVE
SARS Coronavirus 2 by RT PCR: NEGATIVE

## 2021-10-06 NOTE — ED Triage Notes (Signed)
Pt BIB EMS, pt reports with abdominal pain, nausea, vomiting, and headache since today.   4 mg Zofran IV

## 2021-10-06 NOTE — Telephone Encounter (Signed)
Spoke with patient, apologized for the misinformation, the contact information was listed by the Provider in the Referral. I assumed this information was double checked when the referral was faxed.   I was able to contact [facility] and they stated that they never received the referral. I verified that the fax number we sent the referral to was correct - this has been faxed again, twice, to [phone]  Patient is scheduled on Thursday 10/14/21 at 10:00am with Dr Marcos Eke. Arrive by 9:30 [name], [place], [place] Phone: [phone] Pt will have to go by [place] and get a CD of her MRI from November to take to her appt.   Called and spoke with patient. She was very upset that she never heard from anyone regarding her referral. She states that she tried to call the number that I sent her for the Lavena Bullion, MD and the number was incorrect. I apologized for that confusion - the contact information was put in the referral as to who the patient requested. I assumed this was verified at the time the referral was faxed to [facility].  I apologized that no one contacted her to let her know where the referral was sent.   She was very appreciative that I was able to get her scheduled  She is aware that she needs to go by [facility] to pick up the MRI disc I apologized again for the inconvenience and confusion. I gave her my name for future referrals as her main contact.

## 2021-10-06 NOTE — Telephone Encounter (Signed)
Pt called stating that no referral has been sent and this is unacceptable. Pt states that she can barely walk. Pt states this has been going on since 09/15/21. Pt also states that she don't know why it takes so long to get a referral in. Pt states why is she wasting her time and money going to [place] and getting no results. Pt states you can faxc her at [phone]. Please advise.

## 2021-10-06 NOTE — Telephone Encounter (Signed)
I received two messages on this patient, These are notes in the 09/06/2021 Mychart encounter sent to me on this same matter.  The patient is taken care of and I was able to get her scheduled with Neurosurgeon with [facility].   Virl Cagey, CMA  Note to Tammi Sou, CMA  Diamond Nickel, RN  11:07 AM  Spoke with patient, apologized for the misinformation, the contact information was listed by the Provider in the Referral. I assumed this information was double checked when the referral was faxed.    I was able to contact [facility] and they stated that they never received the referral. I verified that the fax number we sent the referral to was correct - this has been faxed again, twice, to [phone]   Patient is scheduled on Thursday 10/14/21 at 10:00am with Dr Marcos Eke. Arrive by 9:30 [place], [place], [place] Phone: [phone] Pt will have to go by [place] and get a CD of her MRI from November to take to her appt.    Called and spoke with patient. She was very upset that she never heard from anyone regarding her referral. She states that she tried to call the number that I sent her for the Lavena Bullion, MD and the number was incorrect. I apologized for that confusion - the contact information was put in the referral as to who the patient requested. I assumed this was verified at the time the referral was faxed to [facility].  I apologized that no one contacted her to let her know where the referral was sent.    She was very appreciative that I was able to get her scheduled  She is aware that she needs to go by [facility] to pick up the MRI disc I apologized again for the inconvenience and confusion. I gave her my name for future referrals as her main contact.

## 2021-10-07 ENCOUNTER — Telehealth: Payer: Self-pay | Admitting: Family Medicine

## 2021-10-07 NOTE — Telephone Encounter (Signed)
Spoke with patient on 10/06/21 regarding her Urology Referral  This was sent 08/19/21 and the patient was notified of where to call to schedule her appt or that she could await their call.   Pt called in yesterday stating that she never received a call to schedule and it is now December and she still has not seen Urology. This Referral was not marked Urgent so it was sent Routine.   I called [facility] and they stated that they called her on 08/20/21 to schedule and left her a VM, stated she never called back. I asked if they tried to call her again and they stated no they only called her that one time. I scheduled the patient for January 25th with Dr Junious Silk (soonest appt - they could not work in sooner)  I called patient back to discuss her appt and I offered to call [facility] to see if they could get her in sooner. She was okay with this.   I called [facility] and was able to get the patient scheduled for 10/14/21 at 10am with Dr Hollice Espy.   Pt was very appreciative and was confirmed the appt.   [facility] appt cancelled.   Nothing further needed.

## 2021-10-13 NOTE — Progress Notes (Incomplete)
10/13/21 1:38 PM   Dana Greer [birth year] [id number]  Referring provider:  Abner Greenspan, MD [place],  [place] [address] No chief complaint on file.    HPI: Dana Greer Dana Greer is a 50 y.o.female who presents today fr further evaluation of urinary frequency and mixed incontinence.   She was last seen in urology 5 years ago by Dr.Eskridge at [facility] and was seen to have microscopic hematuria and urgency of urination.   She has a personal history of bladder prolapse snd elevated serum creatinine.   She was previously taking Mybetriq for her urinary frequency.       PMH: Past Medical History:  Diagnosis Date   Arthritis    Depression    Fibromyalgia    Headache(784.0)    Hx of adenomatous polyp of colon 02/14/2020   Hypertension    Immune deficiency disorder ([place])    Migraine headache    Shingles    Stroke ([place])    Thrombocytopenia ([place]) 10/01/2012    Surgical History: Past Surgical History:  Procedure Laterality Date   ABDOMINAL HYSTERECTOMY     CARPAL TUNNEL RELEASE Left 09/25/2018   Procedure: LEFT CARPAL TUNNEL RELEASE;  Surgeon: Daryll Brod, MD;  Location: [facility];  Service: Orthopedics;  Laterality: Left;   CESAREAN SECTION  2008   x1   PARTIAL HYSTERECTOMY  2012   due to fibroid   UPPER GASTROINTESTINAL ENDOSCOPY     WISDOM TOOTH EXTRACTION  1995    Home Medications:  Allergies as of 10/14/2021       Reactions   Trazodone And Nefazodone Nausea And Vomiting   Amoxicillin Other (See Comments)   Yeast infection    Carafate [sucralfate] Nausea And Vomiting        Medication List        Accurate as of October 13, 2021  1:38 PM. If you have any questions, ask your nurse or doctor.          ALPRAZolam 1 MG tablet Commonly known as: XANAX Take 1 mg by mouth 4 (four) times daily as needed for anxiety or sleep.   amitriptyline 25 MG tablet Commonly known as: ELAVIL Take 2 tablets  (50 mg total) by mouth daily.   amLODipine 5 MG tablet Commonly known as: NORVASC TAKE 1 TABLET BY MOUTH ONCE DAILY   capsaicin 0.025 % cream Commonly known as: ZOSTRIX Apply topically 2 (two) times daily.   cloNIDine 0.2 MG tablet Commonly known as: CATAPRES Take 0.2 mg by mouth 2 (two) times daily.   cyclobenzaprine 10 MG tablet Commonly known as: FLEXERIL Take 1 tablet (10 mg total) by mouth at bedtime as needed for muscle spasms.   EMGALITY  Inject into the skin.   etonogestrel 68 MG Impl implant Commonly known as: NEXPLANON Inject 1 each into the skin continuous. Reported on 01/27/2016   famotidine 20 MG tablet Commonly known as: PEPCID TAKE 1 TABLET BY MOUTH 2 TIMES DAILY   fluticasone 50 MCG/ACT nasal spray Commonly known as: FLONASE Place 2 sprays into both nostrils daily.   gabapentin 600 MG tablet Commonly known as: NEURONTIN TAKE 1 TABLET BY MOUTH 3 TIMES DAILY   Myrbetriq 25 MG Tb24 tablet Generic drug: mirabegron ER TAKE 1 TABLET BY MOUTH ONCE A DAY   ondansetron 8 MG tablet Commonly known as: ZOFRAN Take 8 mg by mouth 3 (three) times daily.   pantoprazole 40 MG tablet Commonly known as: PROTONIX Take  1 tablet (40 mg total) by mouth daily.   PARoxetine 40 MG tablet Commonly known as: PAXIL Take 1 tablet (40 mg total) by mouth 2 (two) times daily.   promethazine 25 MG tablet Commonly known as: PHENERGAN TAKE 1 TABLET BY MOUTH EVERY 8 HOURS AS NEEDED FOR NAUSEA AND VOMITING   Pulmicort Flexhaler 90 MCG/ACT inhaler Generic drug: Budesonide Inhale 1-2 puffs into the lungs daily.   scopolamine 1 MG/3DAYS Commonly known as: Transderm-Scop (1.5 MG) Place 1 patch (1.5 mg total) onto the skin every 3 (three) days.   topiramate 100 MG tablet Commonly known as: TOPAMAX TAKE 1 TABLET BY MOUTH TWICE A DAY   UBRELVY PO Take by mouth.   valACYclovir 500 MG tablet Commonly known as: VALTREX TAKE 1 TABLET BY MOUTH ONCE DAILY         Allergies:  Allergies  Allergen Reactions   Trazodone And Nefazodone Nausea And Vomiting   Amoxicillin Other (See Comments)    Yeast infection    Carafate [Sucralfate] Nausea And Vomiting    Family History: Family History  Problem Relation Age of Onset   Diabetes Father    COPD Father    Healthy Mother    Breast cancer Paternal Aunt    Colon cancer Paternal Grandmother    Rectal cancer Paternal Grandmother    Lung cancer Paternal Uncle    Lung cancer Paternal Aunt    Esophageal cancer Cousin    Stomach cancer Neg Hx     Social History:  reports that she has never smoked. She has never used smokeless tobacco. She reports that she does not currently use alcohol. She reports that she does not currently use drugs after having used the following drugs: Marijuana.   Physical Exam: LMP 10/16/2019 (Within Days) Comment: had hysterectomy-still has half of her uterus and ovaries  Constitutional:  Alert and oriented, No acute distress. HEENT: [place] AT, moist mucus membranes.  Trachea midline, no masses. Cardiovascular: No clubbing, cyanosis, or edema. Respiratory: Normal respiratory effort, no increased work of breathing. Skin: No rashes, bruises or suspicious lesions. Neurologic: Grossly intact, no focal deficits, moving all 4 extremities. Psychiatric: Normal mood and affect.  Laboratory Data:  Lab Results  Component Value Date   CREATININE 0.90 10/06/2021    Lab Results  Component Value Date   HGBA1C 5.6 05/15/2020    Urinalysis   Pertinent Imaging:    Assessment & Plan:     No follow-ups on file.  I,Kailey Littlejohn,acting as a Education administrator for Hollice Espy, MD.,have documented all relevant documentation on the behalf of Hollice Espy, MD,as directed by  Hollice Espy, MD while in the presence of Hollice Espy, [place] [address] [place], [place] [address] [phone]

## 2021-10-14 ENCOUNTER — Ambulatory Visit: Payer: Medicare Other | Admitting: Urology

## 2021-10-14 ENCOUNTER — Encounter: Payer: Self-pay | Admitting: Urology

## 2021-10-14 DIAGNOSIS — M48061 Spinal stenosis, lumbar region without neurogenic claudication: Secondary | ICD-10-CM | POA: Diagnosis not present

## 2021-10-14 DIAGNOSIS — M5416 Radiculopathy, lumbar region: Secondary | ICD-10-CM | POA: Diagnosis not present

## 2021-10-19 ENCOUNTER — Ambulatory Visit (INDEPENDENT_AMBULATORY_CARE_PROVIDER_SITE_OTHER): Payer: Medicare Other | Admitting: Psychology

## 2021-10-19 ENCOUNTER — Other Ambulatory Visit: Payer: Self-pay

## 2021-10-19 DIAGNOSIS — F331 Major depressive disorder, recurrent, moderate: Secondary | ICD-10-CM

## 2021-10-19 NOTE — Progress Notes (Signed)
[facility] Counselor/Therapist Progress Note  Patient ID: Dana Greer, MRN: [id number],    Date: 10/19/2021  Time Spent: 60 minutes  Treatment Type: Individual Therapy  Reported Symptoms: depression, anxiety, tearfulness  Mental Status Exam: Appearance:  Casual     Behavior: Appropriate  Motor: Normal  Speech/Language:  Normal Rate  Affect: Appropriate  Mood: depressed  Thought process: normal  Thought content:   WNL  Sensory/Perceptual disturbances:   WNL  Orientation: oriented to person, place, time/date, and situation  Attention: Good  Concentration: Good  Memory: WNL  Fund of knowledge:  Good  Insight:   Good  Judgment:  Good  Impulse Control: Good   Risk Assessment: Danger to Self:  No Self-injurious Behavior: No Danger to Others: No Duty to Warn:no Physical Aggression / Violence:No  Access to Firearms a concern: No  Gang Involvement:No   Subjective: The patient attended a face-to-face individual therapy session in the office today.  The patient talked today about finding out that her husband has other children by other women as one of the oldest called her children.  We talked about how to handle the situation and she seems to be managing it well.  The patient is encouraging her children to make their decisions about how they want to proceed with getting to know their siblings.  The patient was taking on some of the responsibility for his behavior and I encouraged her not to take responsibility for something that is not hers.  She has not been with her husband for several years and has chosen to raise her children alone as her husband is not supportive.  He also does not contact the children very often.  The patient was given positive feedback for how she is handling the situation and I reframed it for her.  Interventions: Cognitive Behavioral Therapy, Insight-Oriented, and Interpersonal  Diagnosis:Major depressive disorder, recurrent  episode, moderate ([place])  Plan: Please see plan in Therapy charts with target date of 11/19/2021 for progress towards goals.  The patient approved the plan.  Will continue to see the patient on a monthly basis to provide supportive therapy.   Vanya Carberry G Lanitra Battaglini, LCSW

## 2021-10-21 ENCOUNTER — Other Ambulatory Visit: Payer: Self-pay | Admitting: Family Medicine

## 2021-10-25 ENCOUNTER — Other Ambulatory Visit: Payer: Self-pay | Admitting: Family Medicine

## 2021-11-02 ENCOUNTER — Other Ambulatory Visit: Payer: Self-pay

## 2021-11-02 ENCOUNTER — Encounter: Payer: Self-pay | Admitting: Family Medicine

## 2021-11-02 ENCOUNTER — Ambulatory Visit (INDEPENDENT_AMBULATORY_CARE_PROVIDER_SITE_OTHER): Payer: Medicare Other | Admitting: Family Medicine

## 2021-11-02 DIAGNOSIS — L710 Perioral dermatitis: Secondary | ICD-10-CM

## 2021-11-02 MED ORDER — METRONIDAZOLE 0.75 % EX CREA: TOPICAL_CREAM | Freq: Two times a day (BID) | CUTANEOUS | 0 refills | Status: DC

## 2021-11-02 NOTE — Progress Notes (Signed)
Subjective:    Patient ID: Dana Greer, female    DOB: 08-30-1971, 51 y.o.   MRN: [id number]  This visit occurred during the SARS-CoV-2 public health emergency.  Safety protocols were in place, including screening questions prior to the visit, additional usage of staff PPE, and extensive cleaning of exam room while observing appropriate contact time as indicated for disinfecting solutions.   HPI Pt presents for rash around mouth for 1 month  Wt Readings from Last 3 Encounters:  11/02/21 210 lb 4 oz (95.4 kg)  08/30/21 220 lb 2 oz (99.8 kg)  08/12/21 216 lb (98 kg)   38.46 kg/m   Breaking out around mouth -about a month ago  Not eating anything new  She is around dogs quite a bit  Used some hydrocortisone -did not work Not itchy  Used some T gel, tee tree oil  Starts drying up then worsens  No dandruff  Kids-have psoriasis and eczema    Products :  Nothing new  No fragrances  No facials   Works in the yard a lot   She does eat mango - more regularly recently    Patient Active Problem List   Diagnosis Date Noted   Perioral dermatitis 11/02/2021   Mixed incontinence 08/16/2021   Elevated serum creatinine 08/12/2021   Urinary frequency 08/12/2021   Herpes zoster without complication 40/81/4481   Hand pain, left 10/14/2020   Swelling of hand joint, left 10/14/2020   TMJ (temporomandibular joint syndrome) 08/18/2020   AKI (acute kidney injury) ([place]) 05/15/2020   Obesity, Class II, BMI 35-39.9 05/15/2020   Non-intractable cyclical vomiting 85/63/1497   Left-sided back pain 02/25/2020   Hx of adenomatous polyp of colon 02/14/2020   Seasonal allergic rhinitis 01/21/2020   Chronic cough 10/04/2019   Headache, post-traumatic, acute 01/28/2019   Head injury, closed 01/17/2019   Abnormal vaginal bleeding 07/06/2018   Neck pain 06/25/2018   Chronic migraine 04/12/2018   B12 deficiency 01/10/2018   Foot pain, bilateral 01/09/2018   Hand paresthesia  01/09/2018   Vitamin D deficiency 01/09/2018   Upper abdominal pain 11/22/2016   Chronic myofascial pain 10/26/2016   HSV-2 infection 08/17/2016   Adverse effects of medication 11/25/2015   Vertigo 10/08/2015   Carpal tunnel syndrome 03/16/2015   Gout 12/22/2014   Vertiginous migraine 02/03/2014   Intractable migraine 08/31/2012   Osteoarthritis 05/11/2012   Female bladder prolapse 05/11/2012   ADJ DISORDER WITH MIXED ANXIETY & DEPRESSED MOOD 06/30/2008   HYPERHIDROSIS 01/25/2008   BACK PAIN, LUMBAR, CHRONIC 10/16/2007   REACTION, ACUTE STRESS W/EMOTIONAL DSTURB 08/01/2007   Prediabetes 08/01/2007   Essential hypertension 08/01/2007   Goiter 07/11/2007   POLYCYSTIC OVARIAN DISEASE 05/24/2007   HIRSUTISM 05/24/2007   Arthropathy, multiple sites 05/24/2007   Past Medical History:  Diagnosis Date   Arthritis    Depression    Fibromyalgia    Headache(784.0)    Hx of adenomatous polyp of colon 02/14/2020   Hypertension    Immune deficiency disorder ([place])    Migraine headache    Shingles    Stroke ([place])    Thrombocytopenia ([place]) 10/01/2012   Past Surgical History:  Procedure Laterality Date   ABDOMINAL HYSTERECTOMY     CARPAL TUNNEL RELEASE Left 09/25/2018   Procedure: LEFT CARPAL TUNNEL RELEASE;  Surgeon: Daryll Brod, MD;  Location: [facility];  Service: Orthopedics;  Laterality: Left;   CESAREAN SECTION  2008   x1   PARTIAL HYSTERECTOMY  2012  due to fibroid   UPPER GASTROINTESTINAL ENDOSCOPY     WISDOM TOOTH EXTRACTION  1995   Social History   Tobacco Use   Smoking status: Never   Smokeless tobacco: Never  Vaping Use   Vaping Use: Never used  Substance Use Topics   Alcohol use: Not Currently    Comment: occasional   Drug use: Not Currently    Types: Marijuana    Comment: smokes small amount twice daily   Family History  Problem Relation Age of Onset   Diabetes Father    COPD Father    Healthy Mother    Breast cancer Paternal Aunt     Colon cancer Paternal Grandmother    Rectal cancer Paternal Grandmother    Lung cancer Paternal Uncle    Lung cancer Paternal Aunt    Esophageal cancer Cousin    Stomach cancer Neg Hx    Allergies  Allergen Reactions   Trazodone And Nefazodone Nausea And Vomiting   Amoxicillin Other (See Comments)    Yeast infection    Carafate [Sucralfate] Nausea And Vomiting   Current Outpatient Medications on File Prior to Visit  Medication Sig Dispense Refill   ALPRAZolam (XANAX) 1 MG tablet Take 1 mg by mouth 4 (four) times daily as needed for anxiety or sleep.      amitriptyline (ELAVIL) 25 MG tablet Take 2 tablets (50 mg total) by mouth daily. 60 tablet 0   amLODipine (NORVASC) 5 MG tablet TAKE 1 TABLET BY MOUTH ONCE DAILY 30 tablet 3   cloNIDine (CATAPRES) 0.2 MG tablet Take 0.2 mg by mouth 2 (two) times daily.     cyclobenzaprine (FLEXERIL) 10 MG tablet Take 1 tablet (10 mg total) by mouth at bedtime as needed for muscle spasms. 30 tablet 3   etonogestrel (NEXPLANON) 68 MG IMPL implant Inject 1 each into the skin continuous. Reported on 01/27/2016     famotidine (PEPCID) 20 MG tablet TAKE 1 TABLET BY MOUTH 2 TIMES DAILY 60 tablet 2   gabapentin (NEURONTIN) 600 MG tablet TAKE 1 TABLET BY MOUTH 3 TIMES DAILY 270 tablet 1   Galcanezumab-gnlm (EMGALITY [place]) Inject into the skin.     ondansetron (ZOFRAN) 8 MG tablet Take 8 mg by mouth 3 (three) times daily.     promethazine (PHENERGAN) 25 MG tablet TAKE 1 TABLET BY MOUTH EVERY 8 HOURS AS NEEDED FOR NAUSEA AND VOMITING 30 tablet 5   PULMICORT FLEXHALER 90 MCG/ACT inhaler Inhale 1-2 puffs into the lungs daily.     scopolamine (TRANSDERM-SCOP, 1.5 MG,) 1 MG/3DAYS Place 1 patch (1.5 mg total) onto the skin every 3 (three) days. 5 patch 1   topiramate (TOPAMAX) 100 MG tablet TAKE 1 TABLET BY MOUTH TWICE A DAY 60 tablet 5   Ubrogepant (UBRELVY PO) Take by mouth.     valACYclovir (VALTREX) 500 MG tablet TAKE 1 TABLET BY MOUTH ONCE DAILY 30 tablet 5    pantoprazole (PROTONIX) 40 MG tablet Take 1 tablet (40 mg total) by mouth daily. 30 tablet 0   PARoxetine (PAXIL) 40 MG tablet Take 1 tablet (40 mg total) by mouth 2 (two) times daily. 60 tablet 0   No current facility-administered medications on file prior to visit.     Review of Systems  Constitutional:  Negative for activity change, appetite change, fatigue, fever and unexpected weight change.  HENT:  Negative for congestion, ear pain, rhinorrhea, sinus pressure and sore throat.   Eyes:  Negative for pain, redness and visual disturbance.  Respiratory:  Negative for cough, shortness of breath and wheezing.   Cardiovascular:  Negative for chest pain and palpitations.  Gastrointestinal:  Negative for abdominal pain, blood in stool, constipation and diarrhea.  Endocrine: Negative for polydipsia and polyuria.  Genitourinary:  Negative for dysuria, frequency and urgency.  Musculoskeletal:  Negative for arthralgias, back pain and myalgias.  Skin:  Positive for rash. Negative for color change, pallor and wound.  Allergic/Immunologic: Negative for environmental allergies.  Neurological:  Negative for dizziness, syncope and headaches.  Hematological:  Negative for adenopathy. Does not bruise/bleed easily.  Psychiatric/Behavioral:  Negative for decreased concentration and dysphoric mood. The patient is not nervous/anxious.       Objective:   Physical Exam Constitutional:      General: She is not in acute distress.    Appearance: Normal appearance. She is obese. She is not ill-appearing.  Eyes:     General: No scleral icterus.       Right eye: No discharge.        Left eye: No discharge.     Conjunctiva/sclera: Conjunctivae normal.     Pupils: Pupils are equal, round, and reactive to light.  Cardiovascular:     Rate and Rhythm: Normal rate and regular rhythm.     Heart sounds: Normal heart sounds.  Pulmonary:     Effort: Pulmonary effort is normal. No respiratory distress.     Breath  sounds: Normal breath sounds. No wheezing or rales.  Musculoskeletal:     Cervical back: Normal range of motion and neck supple. No tenderness.  Lymphadenopathy:     Cervical: No cervical adenopathy.  Skin:    General: Skin is warm and dry.     Findings: Rash present.     Comments: Papular rash around mouth with slt hyperpigmentation No plaques or scale No pustules or vesicles  No open areas   Neurological:     Mental Status: She is alert.     Cranial Nerves: No cranial nerve deficit.  Psychiatric:        Mood and Affect: Mood normal.          Assessment & Plan:   Problem List Items Addressed This Visit       Digestive   Perioral dermatitis    Rash around mouth for a month  Pt has eaten mango- inst her to avoid touching the skin to her mouth (peel first)  Rash is most consistent with perioral dermatitis (steroid cream not helpful)  Disc use of mild non scented products  Clean gently and watch for redness or swelling Metro cream px to use bid Update if not starting to improve in 2 weeks or if worsening

## 2021-11-02 NOTE — Patient Instructions (Signed)
I think you have perioral dermatitis  Try the metro cream twice daily to the affected areas   If not improving in 2-4 weeks please let us know   Use non scented products  Don't use abrasive products   Peel and cut up mango-eat with fork  Don't let skin touch your face if possible

## 2021-11-02 NOTE — Assessment & Plan Note (Signed)
Rash around mouth for a month  Pt has eaten mango- inst her to avoid touching the skin to her mouth (peel first)  Rash is most consistent with perioral dermatitis (steroid cream not helpful)  Disc use of mild non scented products  Clean gently and watch for redness or swelling Metro cream px to use bid Update if not starting to improve in 2 weeks or if worsening

## 2021-11-12 ENCOUNTER — Encounter: Payer: Self-pay | Admitting: Family Medicine

## 2021-11-12 DIAGNOSIS — R21 Rash and other nonspecific skin eruption: Secondary | ICD-10-CM

## 2021-11-15 DIAGNOSIS — R21 Rash and other nonspecific skin eruption: Secondary | ICD-10-CM | POA: Insufficient documentation

## 2021-11-25 ENCOUNTER — Other Ambulatory Visit: Payer: Self-pay | Admitting: Family Medicine

## 2021-11-25 NOTE — Telephone Encounter (Signed)
Last filled on 05/19/21 #60 tabs with 5 refills, last OV was for a rash on 11/02/21

## 2021-12-15 ENCOUNTER — Ambulatory Visit (INDEPENDENT_AMBULATORY_CARE_PROVIDER_SITE_OTHER): Payer: Medicare Other | Admitting: Psychology

## 2021-12-15 DIAGNOSIS — F331 Major depressive disorder, recurrent, moderate: Secondary | ICD-10-CM

## 2021-12-15 NOTE — Progress Notes (Signed)
[facility] Counselor/Therapist Progress Note  Patient ID: Dana Greer, MRN: [id number],    Date: 12/15/2021  Time Spent: 50 minutes  Treatment Type: Individual Therapy  Reported Symptoms: depression, anxiety, tearfulness  Mental Status Exam: Appearance:  Casual     Behavior: Appropriate  Motor: Normal  Speech/Language:  Normal Rate  Affect: Appropriate  Mood: depressed  Thought process: normal  Thought content:   WNL  Sensory/Perceptual disturbances:   WNL  Orientation: oriented to person, place, time/date, and situation  Attention: Good  Concentration: Good  Memory: WNL  Fund of knowledge:  Good  Insight:   Good  Judgment:  Good  Impulse Control: Good   Risk Assessment: Danger to Self:  No Self-injurious Behavior: No Danger to Others: No Duty to Warn:no Physical Aggression / Violence:No  Access to Firearms a concern: No  Gang Involvement:No   Subjective: The patient attended a face-to-face individual therapy session via video visit today.  The patient gave verbal consent for this session to be on WebEx.  The patient was alone in her home and the therapist was in the office.  The patient reports that she has been physically very ill lately.  She has been throwing up again and having difficulty eating.  We did some problem solving and talked about some ideas for her to follow up on in regards to trying to find a diagnosis and get the relief that she needs.  We also did some problem solving around the situation with her daughter as her daughter needs a therapist and is having difficulty finding one.  We decided that because she is having issues with her health right now that we would meet at least monthly to help her get through this transition time.  Interventions: Cognitive Behavioral Therapy, Insight-Oriented, and Interpersonal  Diagnosis:Major depressive disorder, recurrent episode, moderate ([place])  Plan: Please see plan in Therapy charts with  target date of 11/19/2022 for progress towards goals.  The patient approved the plan.  Will continue to see the patient on a monthly basis to provide supportive therapy.   Besan Ketchem G Ramzy Cappelletti, LCSW                  Korie Streat G Elayah Klooster, LCSW

## 2021-12-22 DIAGNOSIS — Z760 Encounter for issue of repeat prescription: Secondary | ICD-10-CM | POA: Diagnosis not present

## 2021-12-22 DIAGNOSIS — M542 Cervicalgia: Secondary | ICD-10-CM | POA: Diagnosis not present

## 2021-12-22 DIAGNOSIS — G43711 Chronic migraine without aura, intractable, with status migrainosus: Secondary | ICD-10-CM | POA: Diagnosis not present

## 2021-12-22 DIAGNOSIS — R42 Dizziness and giddiness: Secondary | ICD-10-CM | POA: Diagnosis not present

## 2021-12-30 ENCOUNTER — Other Ambulatory Visit: Payer: Self-pay

## 2021-12-30 ENCOUNTER — Ambulatory Visit (INDEPENDENT_AMBULATORY_CARE_PROVIDER_SITE_OTHER): Payer: Medicare Other | Admitting: Allergy

## 2021-12-30 ENCOUNTER — Encounter: Payer: Self-pay | Admitting: Allergy

## 2021-12-30 VITALS — BP 140/90 | HR 82 | Temp 97.9°F | Resp 18 | Ht 62.0 in | Wt 210.6 lb

## 2021-12-30 DIAGNOSIS — L500 Allergic urticaria: Secondary | ICD-10-CM

## 2021-12-30 DIAGNOSIS — J4520 Mild intermittent asthma, uncomplicated: Secondary | ICD-10-CM | POA: Diagnosis not present

## 2021-12-30 DIAGNOSIS — T781XXD Other adverse food reactions, not elsewhere classified, subsequent encounter: Secondary | ICD-10-CM

## 2021-12-30 DIAGNOSIS — J310 Chronic rhinitis: Secondary | ICD-10-CM

## 2021-12-30 MED ORDER — EPINEPHRINE 0.3 MG/0.3ML IJ SOAJ: 0.3000 mg | INTRAMUSCULAR | 1 refills | Status: DC | PRN

## 2021-12-30 NOTE — Progress Notes (Signed)
New Patient Note  RE: Dana Greer MRN: [id number] DOB: 12/25/1970 Date of Office Visit: 12/30/2021  Referring provider: Abner Greenspan, MD Primary care provider: Abner Greenspan, MD  Chief Complaint: Rash  History of present illness: Dana Greer is a 51 y.o. female presenting today for evaluation of skin eruption possibly related to food ingestion.  She states after eating mango she developed facial rash in January 2022. The rash looked like welts and was a little itchy.  No associated swelling with rash.  She is not sure the timeframe of eating mango with onset of rash. She has noted now that she has developed hives after eating oranges as well.  She states she was eating lots of fruits often and multiple times of month she would eat mango/fruits.  She has stopped eating these particular foods due to symptoms she has had with ingestion.  She states different foods "don't agree with me" and she can have profuse vomiting after eating foods like lentils, peas (black eye peas), lima beans.  She states pork can make "me terribly ill" with terrible profuse large vomiting.  She states the vomiting an start about 2-3 hours later after ingestion.  She states the vomiting has been bad to the point of going to ED be needing IV fluids.  She has had EGD and colonoscopy due to the vomiting that she reports has been normal.  She states the GI issues has been ongoing for about 10 years now.     She has a son with a peanut, fish, shellfish allergy.    No history of eczema.   She does report seasonal allergies with symptoms including nasal congestion and drainage, sneezing and itchy and watery eyes.  She will take claritin daily. She uses flonase as needed and does help.    She uses pulmicort 2 puffs as needed for cough.  She denies history of asthma.  The cough she states is worse at night.  She states she will use pulmicort more often in spring time and does use it nightly during the  season and it does help with her cough control.    Review of systems in the past 4 weeks: Review of Systems  Constitutional: Negative.   HENT: Negative.    Eyes: Negative.   Respiratory: Negative.    Cardiovascular: Negative.   Gastrointestinal: Negative.   Musculoskeletal: Negative.   Skin: Negative.   Allergic/Immunologic: Negative.   Neurological: Negative.    All other systems negative unless noted above in HPI  Past medical history: Past Medical History:  Diagnosis Date   Arthritis    Depression    Fibromyalgia    Headache(784.0)    Hx of adenomatous polyp of colon 02/14/2020   Hypertension    Immune deficiency disorder ([place])    Migraine headache    Shingles    Stroke ([place])    Thrombocytopenia ([place]) 10/01/2012    Past surgical history: Past Surgical History:  Procedure Laterality Date   ABDOMINAL HYSTERECTOMY     CARPAL TUNNEL RELEASE Left 09/25/2018   Procedure: LEFT CARPAL TUNNEL RELEASE;  Surgeon: Daryll Brod, MD;  Location: [facility];  Service: Orthopedics;  Laterality: Left;   CESAREAN SECTION  2008   x1   PARTIAL HYSTERECTOMY  2012   due to fibroid   UPPER GASTROINTESTINAL ENDOSCOPY     WISDOM TOOTH EXTRACTION  1995    Family history:  Family History  Problem Relation Age of Onset  Diabetes Father    COPD Father    Healthy Mother    Breast cancer Paternal Aunt    Colon cancer Paternal Grandmother    Rectal cancer Paternal Grandmother    Lung cancer Paternal Uncle    Lung cancer Paternal Aunt    Esophageal cancer Cousin    Stomach cancer Neg Hx     Social history: Lives in a townhome without carpeting with gas heating and central cooling.  Dogs in the home.  There is no concern for water damage, mildew or roaches in the home.  She does not work.  She does not report a smoking history.   Medication List: Current Outpatient Medications  Medication Sig Dispense Refill   AIMOVIG 140 MG/ML SOAJ Inject into the skin.      ALPRAZolam (XANAX) 1 MG tablet Take 1 mg by mouth 4 (four) times daily as needed for anxiety or sleep.      amitriptyline (ELAVIL) 25 MG tablet Take 2 tablets (50 mg total) by mouth daily. 60 tablet 0   amLODipine (NORVASC) 5 MG tablet TAKE 1 TABLET BY MOUTH ONCE DAILY 30 tablet 3   cloNIDine (CATAPRES) 0.2 MG tablet Take 0.2 mg by mouth 2 (two) times daily.     cyclobenzaprine (FLEXERIL) 10 MG tablet Take 1 tablet (10 mg total) by mouth at bedtime as needed for muscle spasms. 30 tablet 3   etonogestrel (NEXPLANON) 68 MG IMPL implant Inject 1 each into the skin continuous. Reported on 01/27/2016     famotidine (PEPCID) 20 MG tablet TAKE 1 TABLET BY MOUTH 2 TIMES DAILY 60 tablet 2   Galcanezumab-gnlm (EMGALITY [place]) Inject into the skin.     meclizine (ANTIVERT) 25 MG tablet Take 50 mg by mouth as needed for dizziness.     meloxicam (MOBIC) 7.5 MG tablet Take 7.5 mg by mouth daily.     ondansetron (ZOFRAN) 8 MG tablet Take 8 mg by mouth 3 (three) times daily.     pantoprazole (PROTONIX) 40 MG tablet Take 1 tablet (40 mg total) by mouth daily. 30 tablet 0   PARoxetine (PAXIL) 40 MG tablet Take 1 tablet (40 mg total) by mouth 2 (two) times daily. 60 tablet 0   promethazine (PHENERGAN) 25 MG tablet TAKE 1 TABLET BY MOUTH EVERY 8 HOURS AS NEEDED FOR NAUSEA AND VOMITING 30 tablet 5   PULMICORT FLEXHALER 90 MCG/ACT inhaler Inhale 1-2 puffs into the lungs daily.     scopolamine (TRANSDERM-SCOP, 1.5 MG,) 1 MG/3DAYS Place 1 patch (1.5 mg total) onto the skin every 3 (three) days. 5 patch 1   SHINGRIX injection      topiramate (TOPAMAX) 100 MG tablet TAKE 1 TABLET BY MOUTH TWICE A DAY 60 tablet 5   Ubrogepant (UBRELVY PO) Take by mouth.     valACYclovir (VALTREX) 500 MG tablet TAKE 1 TABLET BY MOUTH ONCE DAILY 30 tablet 5   No current facility-administered medications for this visit.    Known medication allergies: Allergies  Allergen Reactions   Trazodone And Nefazodone Nausea And Vomiting    Amoxicillin Other (See Comments)    Yeast infection    Carafate [Sucralfate] Nausea And Vomiting     Physical examination: Blood pressure 140/90, pulse 82, temperature 97.9 F (36.6 C), temperature source Temporal, resp. rate 18, height 5\' 2"  (1.575 m), weight 210 lb 9.6 oz (95.5 kg), last menstrual period 10/16/2019, SpO2 100 %.  General: Alert, interactive, in no acute distress. HEENT: [place]LA, TMs pearly gray, turbinates non-edematous without  discharge, post-pharynx non erythematous. Neck: Supple without lymphadenopathy. Lungs: Clear to auscultation without wheezing, rhonchi or rales. {no increased work of breathing. CV: Normal S1, S2 without murmurs. Abdomen: Nondistended, nontender. Skin: Warm and dry, without lesions or rashes. Extremities:  No clubbing, cyanosis or edema. Neuro:   Grossly intact.  Diagnositics/Labs: None today  Assessment and plan:   Adverse food reaction Allergic hives (urticaria) - will obtain IgE levels for mango, orange, pork (red meat allergy panel), lentil/beans/peas - will also obtain tryptase level which looks to see if your allergy cells are "hyperactive"  - continue for now avoidance of the above foods while undergoing work-up - have access to self-injectable epinephrine (Epipen or AuviQ) 0.3mg  at all times - follow emergency action plan in case of allergic reaction - if you develop further episodes of hives would take Claritin 10mg  1-2 tabs daily until improved - discussed today profuse vomiting after foods could represent a FPIES reaction which is not a food allergy but still causes issues profuse vomiting hours after ingestion that can lead to dehydration after eating.  She already has access to Zofran for as needed use.  Allergies - will obtain environmental allergy panel - continue Claritin 10mg  daily as needed - continue Flonase 2 sprays each nostril daily for 1-2 weeks at a time before stopping once nasal congestion improves for maximum  benefit  Reactive airway - continue Pulmicort use seasonally.  If not meeting the below goals should use Pulmicort on daily basis - have access to albuterol inhaler 2 puffs every 4-6 hours as needed for cough/wheeze/shortness of breath/chest tightness.  May use 15-20 minutes prior to activity.   Monitor frequency of use.    Breathing control goals:  Full participation in all desired activities (may need albuterol before activity) Albuterol use two time or less a week on average (not counting use with activity) Cough interfering with sleep two time or less a month Oral steroids no more than once a year No hospitalizations  Follow-up in 3-4 months or sooner if needed   I appreciate the opportunity to take part in Dana Greer's care. Please do not hesitate to contact me with questions.  Sincerely,   Prudy Feeler, MD Allergy/Immunology [facility]

## 2021-12-30 NOTE — Patient Instructions (Addendum)
Adverse food reaction ?Allergic hives (urticaria ?- will obtain IgE levels for mango, orange, pork (red meat allergy panel), lentil/beans/peas ?- will also obtain tryptase level which looks to see if your allergy cells are "hyperactive"  ?- continue for now avoidance of the above foods while undergoing work-up ?- have access to self-injectable epinephrine (Epipen or AuviQ) 0.3mg  at all times ?- follow emergency action plan in case of allergic reaction ?- if you develop further episodes of hives would take Claritin 10mg  1-2 tabs daily until improved ?- discussed today profuse vomiting after foods could represent a FPIES reaction which is not a food allergy but still causes issues like dehydration after eating certain foods ? ?Allergies ?- will obtain environmental allergy panel ?- continue Claritin 10mg  daily as needed ?- continue Flonase 2 sprays each nostril daily for 1-2 weeks at a time before stopping once nasal congestion improves for maximum benefit ? ?Reactive airway ?- continue Pulmicort use seasonally.  If not meeting the below goals should use Pulmicort on daily basis ?- have access to albuterol inhaler 2 puffs every 4-6 hours as needed for cough/wheeze/shortness of breath/chest tightness.  May use 15-20 minutes prior to activity.   Monitor frequency of use.   ? ?Breathing control goals:  ?Full participation in all desired activities (may need albuterol before activity) ?Albuterol use two time or less a week on average (not counting use with activity) ?Cough interfering with sleep two time or less a month ?Oral steroids no more than once a year ?No hospitalizations ? ?Follow-up in 3-4 months or sooner if needed ? ? ?**We are ordering labs, so please allow 1-2 weeks for the results to come back.  With the newly implemented Cures Act, the labs might be visible to you at the same time that they become visible to me.  However, I will not address the results until all of the results come  back, so please be  patient.  In the meantime, continue avoiding your triggering food(s) in your After Visit Summary, including avoidance measures (if applicable), until you hear from me about the results.   ? ? ?

## 2021-12-31 ENCOUNTER — Ambulatory Visit (INDEPENDENT_AMBULATORY_CARE_PROVIDER_SITE_OTHER): Payer: Medicare Other | Admitting: Urology

## 2021-12-31 ENCOUNTER — Encounter: Payer: Self-pay | Admitting: Urology

## 2021-12-31 VITALS — BP 156/100 | HR 71 | Ht 62.0 in | Wt 210.0 lb

## 2021-12-31 DIAGNOSIS — N3941 Urge incontinence: Secondary | ICD-10-CM

## 2021-12-31 DIAGNOSIS — R3129 Other microscopic hematuria: Secondary | ICD-10-CM | POA: Diagnosis not present

## 2021-12-31 DIAGNOSIS — R350 Frequency of micturition: Secondary | ICD-10-CM | POA: Diagnosis not present

## 2021-12-31 LAB — MICROSCOPIC EXAMINATION

## 2021-12-31 LAB — URINALYSIS, COMPLETE
Bilirubin, UA: NEGATIVE
Glucose, UA: NEGATIVE
Ketones, UA: NEGATIVE
Leukocytes,UA: NEGATIVE
Nitrite, UA: NEGATIVE
Protein,UA: NEGATIVE
Specific Gravity, UA: 1.02 (ref 1.005–1.030)
Urobilinogen, Ur: 0.2 mg/dL (ref 0.2–1.0)
pH, UA: 7 (ref 5.0–7.5)

## 2021-12-31 MED ORDER — GEMTESA 75 MG PO TABS: 75.0000 mg | ORAL_TABLET | Freq: Every day | ORAL | 0 refills | Status: DC

## 2021-12-31 NOTE — Progress Notes (Signed)
? ?12/31/2021 ?12:46 PM  ? ?Dana Greer ?[birth year] ?[id number] ? ?Referring provider: Abner Greenspan, MD ?[place] ?[place],  [place] [address] ? ?Chief Complaint  ?Patient presents with  ? Urinary Frequency  ? ? ?HPI: ?Dana Greer is a 51 y.o. female referred for evaluation of urinary incontinence. ? ?States previously followed at [facility] for urinary incontinence ?Was initially on Uroxatrol and then switch to Myrbetriq ?Myrbetriq was initially effective however she had recurrent symptoms and has been of this medication approximately 3 years ?History of prolapse which has not been treated.  Prior hysterectomy ?She may also have had a urodynamic study at [facility] ?Presently complains of urinary frequency, urgency and urge incontinence ?Nighttime symptoms are most bothersome ?Denies dysuria or gross hematuria ?No flank, abdominal or pelvic pain ? ? ?PMH: ?Past Medical History:  ?Diagnosis Date  ? Arthritis   ? Depression   ? Fibromyalgia   ? Headache(784.0)   ? Hx of adenomatous polyp of colon 02/14/2020  ? Hypertension   ? Immune deficiency disorder ([place])   ? Migraine headache   ? Shingles   ? Stroke [facility])   ? Thrombocytopenia ([place]) 10/01/2012  ? ? ?Surgical History: ?Past Surgical History:  ?Procedure Laterality Date  ? ABDOMINAL HYSTERECTOMY    ? CARPAL TUNNEL RELEASE Left 09/25/2018  ? Procedure: LEFT CARPAL TUNNEL RELEASE;  Surgeon: Daryll Brod, MD;  Location: [facility];  Service: Orthopedics;  Laterality: Left;  ? CESAREAN SECTION  2008  ? x1  ? PARTIAL HYSTERECTOMY  2012  ? due to fibroid  ? UPPER GASTROINTESTINAL ENDOSCOPY    ? [place] EXTRACTION  1995  ? ? ?Home Medications:  ?Allergies as of 12/31/2021   ? ?   Reactions  ? Trazodone And Nefazodone Nausea And Vomiting  ? Amoxicillin Other (See Comments)  ? Yeast infection   ? Carafate [sucralfate] Nausea And Vomiting  ? ?  ? ?  ?Medication List  ?  ? ?  ? Accurate as of December 31, 2021 12:46 PM. If you have  any questions, ask your nurse or doctor.  ?  ?  ? ?  ? ?STOP taking these medications   ? ?pantoprazole 40 MG tablet ?Commonly known as: PROTONIX ?Stopped by: Abbie Sons, MD ?  ?PARoxetine 40 MG tablet ?Commonly known as: PAXIL ?Stopped by: Abbie Sons, MD ?  ? ?  ? ?TAKE these medications   ? ?Aimovig 140 MG/ML Soaj ?Generic drug: Erenumab-aooe ?Inject into the skin. ?  ?ALPRAZolam 1 MG tablet ?Commonly known as: Duanne Moron ?Take 1 mg by mouth 4 (four) times daily as needed for anxiety or sleep. ?  ?amitriptyline 25 MG tablet ?Commonly known as: ELAVIL ?Take 2 tablets (50 mg total) by mouth daily. ?  ?amLODipine 5 MG tablet ?Commonly known as: NORVASC ?TAKE 1 TABLET BY MOUTH ONCE DAILY ?  ?cloNIDine 0.2 MG tablet ?Commonly known as: CATAPRES ?Take 0.2 mg by mouth 2 (two) times daily. ?  ?cyclobenzaprine 10 MG tablet ?Commonly known as: FLEXERIL ?Take 1 tablet (10 mg total) by mouth at bedtime as needed for muscle spasms. ?  ?EMGALITY [place] ?Inject into the skin. ?  ?EPINEPHrine 0.3 mg/0.3 mL Soaj injection ?Commonly known as: EpiPen 2-Pak ?Inject 0.3 mg into the muscle as needed for anaphylaxis. ?  ?etonogestrel 68 MG Impl implant ?Commonly known as: NEXPLANON ?Inject 1 each into the skin continuous. Reported on 01/27/2016 ?  ?famotidine 20 MG tablet ?Commonly known  as: PEPCID ?TAKE 1 TABLET BY MOUTH 2 TIMES DAILY ?  ?Gemtesa 75 MG Tabs ?Generic drug: Vibegron ?Take 75 mg by mouth daily. ?Started by: Abbie Sons, MD ?  ?meclizine 25 MG tablet ?Commonly known as: ANTIVERT ?Take 50 mg by mouth as needed for dizziness. ?  ?meloxicam 7.5 MG tablet ?Commonly known as: MOBIC ?Take 7.5 mg by mouth daily. ?  ?ondansetron 8 MG tablet ?Commonly known as: ZOFRAN ?Take 8 mg by mouth 3 (three) times daily. ?  ?promethazine 25 MG tablet ?Commonly known as: PHENERGAN ?TAKE 1 TABLET BY MOUTH EVERY 8 HOURS AS NEEDED FOR NAUSEA AND VOMITING ?  ?Pulmicort Flexhaler 90 MCG/ACT inhaler ?Generic drug: Budesonide ?Inhale 1-2 puffs  into the lungs daily. ?  ?scopolamine 1 MG/3DAYS ?Commonly known as: Transderm-Scop (1.5 MG) ?Place 1 patch (1.5 mg total) onto the skin every 3 (three) days. ?  ?Shingrix injection ?Generic drug: Zoster Vaccine Adjuvanted ?  ?topiramate 100 MG tablet ?Commonly known as: TOPAMAX ?TAKE 1 TABLET BY MOUTH TWICE A DAY ?  ?UBRELVY PO ?Take by mouth. ?  ?valACYclovir 500 MG tablet ?Commonly known as: VALTREX ?TAKE 1 TABLET BY MOUTH ONCE DAILY ?  ? ?  ? ? ?Allergies:  ?Allergies  ?Allergen Reactions  ? Trazodone And Nefazodone Nausea And Vomiting  ? Amoxicillin Other (See Comments)  ?  Yeast infection   ? Carafate [Sucralfate] Nausea And Vomiting  ? ? ?Family History: ?Family History  ?Problem Relation Age of Onset  ? Diabetes Father   ? COPD Father   ? Healthy Mother   ? Breast cancer Paternal Aunt   ? Colon cancer Paternal Grandmother   ? Rectal cancer Paternal Grandmother   ? Lung cancer Paternal Uncle   ? Lung cancer Paternal Aunt   ? Esophageal cancer Cousin   ? Stomach cancer Neg Hx   ? ? ?Social History:  reports that she has never smoked. She has never used smokeless tobacco. She reports current alcohol use. She reports current drug use. Drug: Marijuana. ? ? ?Physical Exam: ?BP (!) 156/100   Pulse 71   Ht 5\' 2"  (1.575 m)   Wt 210 lb (95.3 kg)   LMP 10/16/2019 (Within Days) Comment: had hysterectomy-still has half of her uterus and ovaries  BMI 38.41 kg/m?   ?Constitutional:  Alert and oriented, No acute distress. ?HEENT: [place] AT, moist mucus membranes.  Trachea midline, no masses. ?Cardiovascular: No clubbing, cyanosis, or edema. ?Respiratory: Normal respiratory effort, no increased work of breathing. ?Psychiatric: Normal mood and affect. ? ?Laboratory Data: ? ?Urinalysis ?Dipstick 2+ blood ?Microscopy 3-10 RBC ?No significant epithelial cells ? ? ?Assessment & Plan:   ? ?1.  Urge incontinence ?[facility] records requested for review ?Trial Gemtesa 75 mg daily-samples given ? ?2.  Microhematuria ?Recommend  further evaluation with CT urogram and cystoscopy ?The procedures were discussed and she desires to schedule ? ? ?Abbie Sons, MD ? ?[place] ?[address] ?[place], [place] [address] ?[phone] ? ?

## 2022-01-03 LAB — TRYPTASE: Tryptase: 3.2 ug/L (ref 2.2–13.2)

## 2022-01-03 LAB — ALLERGENS W/TOTAL IGE AREA 2

## 2022-01-03 LAB — ALPHA-GAL PANEL
Allergen Lamb IgE: <0.1 kU/L
Beef IgE: <0.1 kU/L
IgE (Immunoglobulin E), Serum: 8 IU/mL (ref 6–495)
O215-IgE Alpha-Gal: <0.1 kU/L
Pork IgE: <0.1 kU/L

## 2022-01-03 LAB — ALLERGEN, KIDNEY BEAN, RF287: Kidney Bean IgE: <0.1 kU/L

## 2022-01-03 LAB — F235-IGE LENTIL: Allergen Lentil IgE: <0.1 kU/L

## 2022-01-03 LAB — ALLERGEN, ORANGE F33: Orange: <0.1 kU/L

## 2022-01-03 LAB — ALLERGEN, MANGO, F91: Mango IgE: <0.1 kU/L

## 2022-01-07 ENCOUNTER — Encounter: Payer: Self-pay | Admitting: Allergy

## 2022-01-11 ENCOUNTER — Telehealth: Payer: Self-pay | Admitting: *Deleted

## 2022-01-11 NOTE — Telephone Encounter (Signed)
Spoke with patient, she stated that she believes there was a miscommunication regarding a referral to see [employer]. She stated that her psychiatrist was the one who mentioned a referral to see Belva Bertin. Patient stated she was not going to do that as she has already been to GI. Patient stated that they have done several test and have not had an luck. Patient stated that we have been wonderful, he is very appreciative for all we have done and looks forward to her follow up visit 03/02/2022. ?

## 2022-01-11 NOTE — Telephone Encounter (Signed)
I called and spoke with the patient and she stated that this has been similar symptoms that have been going on for over 10 years. She states that her PCP and Psychiatrist have discussed this with her and her PCP won't do anything further for her. She states that you had mentioned something about referring her to a [employer]? If so she was wondering if you could do that.  ?

## 2022-01-11 NOTE — Telephone Encounter (Signed)
Called and spoke with the patient to review lab results. While on the phone with the patient she stated that she has still been having issues with throwing up for the last 4 days. She states that she has been "rolling" around nude because she keeps sweating through her clothes and vomiting on herself. She states that she has also had diarrhea. She states that she has also been having frequent urination. She is eating some soup and drinking some water today for the first time.  ?

## 2022-01-12 ENCOUNTER — Ambulatory Visit: Payer: Medicare Other | Admitting: Psychology

## 2022-01-18 ENCOUNTER — Other Ambulatory Visit: Payer: Self-pay

## 2022-01-18 ENCOUNTER — Ambulatory Visit
Admission: RE | Admit: 2022-01-18 | Discharge: 2022-01-18 | Disposition: A | Discharge status: Home / Self Care | Payer: Medicare Other | Source: Ambulatory Visit | Attending: Urology | Admitting: Urology

## 2022-01-18 DIAGNOSIS — N3941 Urge incontinence: Secondary | ICD-10-CM | POA: Insufficient documentation

## 2022-01-18 DIAGNOSIS — R319 Hematuria, unspecified: Secondary | ICD-10-CM | POA: Diagnosis not present

## 2022-01-18 DIAGNOSIS — R3129 Other microscopic hematuria: Secondary | ICD-10-CM | POA: Diagnosis not present

## 2022-01-18 LAB — POCT I-STAT CREATININE: Creatinine, Ser: 1.3 mg/dL — ABNORMAL HIGH (ref 0.44–1.00)

## 2022-01-18 MED ORDER — IOHEXOL 300 MG/ML  SOLN
125.0000 mL | Freq: Once | INTRAMUSCULAR | Status: AC | PRN
  Administered 2022-01-18: 125 mL via INTRAVENOUS

## 2022-01-24 ENCOUNTER — Other Ambulatory Visit: Payer: Self-pay | Admitting: Family Medicine

## 2022-01-24 DIAGNOSIS — R053 Chronic cough: Secondary | ICD-10-CM

## 2022-01-25 NOTE — Telephone Encounter (Signed)
Last OV was on 11/02/21 for a rash, valtrex last filled on 08/26/21 #30 tabs with 5 refills ? ? ?

## 2022-01-27 NOTE — Telephone Encounter (Signed)
I refilled it  ?Tell her she really does not need to take it more than 10 days for a flare up  ? ? ?

## 2022-01-27 NOTE — Telephone Encounter (Signed)
Pt said she doesn't take med daily she only takes it when she is starting to have a flare up. Pt said when she has a flare up she will take it daily for about a month then start to wean herself off of it, she will start taking it every other day after that and then twice a week for a while then she will stop it until she has another flare up.  ?

## 2022-01-31 NOTE — Telephone Encounter (Signed)
Please ask who px last?  Was she diagnosed with asthma /cough variant?   Please let me know so I can update problem list and then refill  ?

## 2022-01-31 NOTE — Telephone Encounter (Signed)
Patient notified as instructed by telephone and verbalized understanding. ? ?Patient also wanted to know if Dr. Glori Bickers will refill her Pulmicort inhaler. Patient stated that she gets a cough this time of year and in the fall. Patient had no other complaints. Patient stated if she needs to schedule an appointment to get this she can. Last office visit 11/02/21 ?Listed under historical ?Pharmacy [facility] ?

## 2022-02-02 NOTE — Telephone Encounter (Signed)
Left message to return call to our office.  

## 2022-02-03 ENCOUNTER — Ambulatory Visit (INDEPENDENT_AMBULATORY_CARE_PROVIDER_SITE_OTHER): Payer: Medicare Other | Admitting: Urology

## 2022-02-03 ENCOUNTER — Encounter: Payer: Self-pay | Admitting: Urology

## 2022-02-03 VITALS — BP 154/95 | HR 80 | Ht 61.0 in | Wt 204.0 lb

## 2022-02-03 DIAGNOSIS — R3129 Other microscopic hematuria: Secondary | ICD-10-CM

## 2022-02-03 DIAGNOSIS — N3941 Urge incontinence: Secondary | ICD-10-CM

## 2022-02-03 LAB — URINALYSIS, COMPLETE
Bilirubin, UA: NEGATIVE
Glucose, UA: NEGATIVE
Leukocytes,UA: NEGATIVE
Nitrite, UA: NEGATIVE
Specific Gravity, UA: >1.03 — ABNORMAL HIGH (ref 1.005–1.030)
Urobilinogen, Ur: 0.2 mg/dL (ref 0.2–1.0)
pH, UA: 6 (ref 5.0–7.5)

## 2022-02-03 LAB — MICROSCOPIC EXAMINATION: Bacteria, UA: NONE SEEN

## 2022-02-03 MED ORDER — GEMTESA 75 MG PO TABS: 75.0000 mg | ORAL_TABLET | Freq: Every day | ORAL | 12 refills | Status: DC

## 2022-02-05 ENCOUNTER — Encounter: Payer: Self-pay | Admitting: Urology

## 2022-02-05 NOTE — Progress Notes (Signed)
? ?  02/05/22 ? ?CC:  ?Chief Complaint  ?Patient presents with  ? Cysto  ? ? ?HPI: Seen 12/31/2021 for urge incontinence and microhematuria.  CTU 12/2021 without upper tract abnormalities.  [facility] records reviewed.  Previously seen for microhematuria and urinary incontinence.  Had negative cystoscopies in 2009, 2013 and 2016.  Negative upper tract imaging.  Urodynamic study 2013 with normal bladder capacity and hypersensitivity ? ?At last visit she was given a trial of Gemtesa which she states significantly improved her symptoms ? ?Blood pressure (!) 154/95, pulse 80, height '5\' 1"'$  (1.549 m), weight 204 lb (92.5 kg), last menstrual period 10/16/2019. ?NED. A&Ox3.   ?No respiratory distress   ?Abd soft, NT, ND ?Normal external genitalia with patent urethral meatus ? ?Cystoscopy Procedure Note ? ?Patient identification was confirmed, informed consent was obtained, and patient was prepped using Betadine solution.  Lidocaine jelly was administered per urethral meatus.   ? ?Procedure: ?- Flexible cystoscope introduced, without any difficulty.   ?- Thorough search of the bladder revealed: ?   normal urethral meatus ?   normal urothelium ?   no stones ?   no ulcers  ?   no tumors ?   no urethral polyps ?   no trabeculation ? ?- Ureteral orifices were normal in position and appearance. ? ?Post-Procedure: ?- Patient tolerated the procedure well ? ?Assessment/ Plan: ?No bladder mucosal abnormalities on cystoscopy ?Negative upper tract imaging on CTU ?Significant improvement in voiding symptoms on Gemtesa ?Given additional samples and Rx sent to pharmacy ? ? ? ?Abbie Sons, MD ? ?

## 2022-02-07 MED ORDER — PULMICORT FLEXHALER 90 MCG/ACT IN AEPB: 1.0000 | INHALATION_SPRAY | Freq: Every day | RESPIRATORY_TRACT | 5 refills | Status: DC

## 2022-02-07 NOTE — Assessment & Plan Note (Signed)
pulmicort helps much in allergy season (spring) ?

## 2022-02-07 NOTE — Telephone Encounter (Signed)
LVM--to call the office back regarding Rx Pulmicort medication ?

## 2022-02-07 NOTE — Telephone Encounter (Signed)
Spoke to pt--stated using the Rx pulmicort --for dry cough at night and worse when weather changing like in spring. ?

## 2022-02-08 ENCOUNTER — Other Ambulatory Visit: Payer: Self-pay | Admitting: Family Medicine

## 2022-02-08 NOTE — Telephone Encounter (Signed)
Looks like urologist d/c med on 02/03/22, please advise  ?

## 2022-02-10 NOTE — Telephone Encounter (Signed)
Spoke to pt and she said she is the one who told the urologist she isn't taking med anymore. Pt said that her BP is stable on the clonidine and she stopped taking the amlodipine a while ago. Pt didn't want me to refill med (auto refill), Rx declined, FYI to PCP ?

## 2022-02-11 ENCOUNTER — Other Ambulatory Visit: Payer: Self-pay | Admitting: Family Medicine

## 2022-02-24 ENCOUNTER — Other Ambulatory Visit: Payer: Self-pay | Admitting: *Deleted

## 2022-02-24 NOTE — Telephone Encounter (Signed)
Received fax refill request from [facility] saying pt wants to xfer med to them. Doesn't look like PCP has filled med before, will route to PCP for review  ?

## 2022-03-02 ENCOUNTER — Encounter: Payer: Medicare Other | Admitting: Allergy

## 2022-03-23 ENCOUNTER — Emergency Department (HOSPITAL_COMMUNITY)
Admission: EM | Admit: 2022-03-23 | Discharge: 2022-03-23 | Disposition: A | Discharge status: Home / Self Care | Payer: Medicare Other | Attending: Emergency Medicine | Admitting: Emergency Medicine

## 2022-03-23 ENCOUNTER — Encounter (HOSPITAL_COMMUNITY): Payer: Self-pay

## 2022-03-23 ENCOUNTER — Other Ambulatory Visit: Payer: Self-pay

## 2022-03-23 DIAGNOSIS — R112 Nausea with vomiting, unspecified: Secondary | ICD-10-CM | POA: Insufficient documentation

## 2022-03-23 DIAGNOSIS — Z79899 Other long term (current) drug therapy: Secondary | ICD-10-CM | POA: Insufficient documentation

## 2022-03-23 DIAGNOSIS — F419 Anxiety disorder, unspecified: Secondary | ICD-10-CM | POA: Diagnosis not present

## 2022-03-23 DIAGNOSIS — R1084 Generalized abdominal pain: Secondary | ICD-10-CM | POA: Diagnosis not present

## 2022-03-23 DIAGNOSIS — R9431 Abnormal electrocardiogram [ECG] [EKG]: Secondary | ICD-10-CM | POA: Diagnosis not present

## 2022-03-23 DIAGNOSIS — R110 Nausea: Secondary | ICD-10-CM | POA: Diagnosis not present

## 2022-03-23 DIAGNOSIS — I10 Essential (primary) hypertension: Secondary | ICD-10-CM | POA: Insufficient documentation

## 2022-03-23 DIAGNOSIS — R319 Hematuria, unspecified: Secondary | ICD-10-CM | POA: Insufficient documentation

## 2022-03-23 LAB — I-STAT BETA HCG BLOOD, ED (MC, WL, AP ONLY): I-stat hCG, quantitative: <5 m[IU]/mL (ref <5)

## 2022-03-23 LAB — CBC
HCT: 42.8 % (ref 36.0–46.0)
Hemoglobin: 14.5 g/dL (ref 12.0–15.0)
MCH: 32.7 pg (ref 26.0–34.0)
MCHC: 33.9 g/dL (ref 30.0–36.0)
MCV: 96.6 fL (ref 80.0–100.0)
Platelets: 164 10*3/uL (ref 150–400)
RBC: 4.43 MIL/uL (ref 3.87–5.11)
RDW: 13.3 % (ref 11.5–15.5)
WBC: 12 10*3/uL — ABNORMAL HIGH (ref 4.0–10.5)
nRBC: 0 % (ref 0.0–0.2)

## 2022-03-23 LAB — URINALYSIS, ROUTINE W REFLEX MICROSCOPIC
Bilirubin Urine: NEGATIVE
Glucose, UA: 50 mg/dL — AB
Ketones, ur: 80 mg/dL — AB
Leukocytes,Ua: NEGATIVE
Nitrite: NEGATIVE
Protein, ur: 100 mg/dL — AB
Specific Gravity, Urine: 1.011 (ref 1.005–1.030)
pH: 8 (ref 5.0–8.0)

## 2022-03-23 LAB — COMPREHENSIVE METABOLIC PANEL
ALT: 12 U/L (ref 0–44)
AST: 21 U/L (ref 15–41)
Albumin: 5 g/dL (ref 3.5–5.0)
Alkaline Phosphatase: 52 U/L (ref 38–126)
Anion gap: 13 (ref 5–15)
BUN: 8 mg/dL (ref 6–20)
CO2: 22 mmol/L (ref 22–32)
Calcium: 10 mg/dL (ref 8.9–10.3)
Chloride: 106 mmol/L (ref 98–111)
Creatinine, Ser: 0.97 mg/dL (ref 0.44–1.00)
GFR, Estimated: >60 mL/min (ref >60)
Glucose, Bld: 169 mg/dL — ABNORMAL HIGH (ref 70–99)
Potassium: 3.4 mmol/L — ABNORMAL LOW (ref 3.5–5.1)
Sodium: 141 mmol/L (ref 135–145)
Total Bilirubin: 1.2 mg/dL (ref 0.3–1.2)
Total Protein: 8.8 g/dL — ABNORMAL HIGH (ref 6.5–8.1)

## 2022-03-23 LAB — LIPASE, BLOOD: Lipase: 24 U/L (ref 11–51)

## 2022-03-23 MED ORDER — SODIUM CHLORIDE 0.9 % IV BOLUS
1000.0000 mL | Freq: Once | INTRAVENOUS | Status: AC
  Administered 2022-03-23: 1000 mL via INTRAVENOUS

## 2022-03-23 MED ORDER — PROCHLORPERAZINE EDISYLATE 10 MG/2ML IJ SOLN
10.0000 mg | Freq: Once | INTRAMUSCULAR | Status: AC
  Administered 2022-03-23: 10 mg via INTRAVENOUS
  Filled 2022-03-23: qty 2

## 2022-03-23 MED ORDER — METOCLOPRAMIDE HCL 5 MG/ML IJ SOLN
10.0000 mg | Freq: Once | INTRAMUSCULAR | Status: AC
  Administered 2022-03-23: 10 mg via INTRAVENOUS
  Filled 2022-03-23: qty 2

## 2022-03-23 MED ORDER — MORPHINE SULFATE (PF) 4 MG/ML IV SOLN
4.0000 mg | Freq: Once | INTRAVENOUS | Status: AC
  Administered 2022-03-23: 4 mg via INTRAVENOUS
  Filled 2022-03-23: qty 1

## 2022-03-23 MED ORDER — PROMETHAZINE HCL 25 MG RE SUPP: 25.0000 mg | Freq: Four times a day (QID) | RECTAL | 0 refills | Status: DC | PRN

## 2022-03-23 MED ORDER — CLONIDINE HCL 0.1 MG PO TABS
0.2000 mg | ORAL_TABLET | Freq: Once | ORAL | Status: AC
  Administered 2022-03-23: 0.2 mg via ORAL
  Filled 2022-03-23: qty 2

## 2022-03-23 MED ORDER — ONDANSETRON 4 MG PO TBDP: 4.0000 mg | ORAL_TABLET | Freq: Once | ORAL | Status: DC

## 2022-03-23 MED ORDER — SODIUM CHLORIDE 0.9 % IV BOLUS
1000.0000 mL | Freq: Once | INTRAVENOUS | Status: AC
Start: 2022-03-23 — End: 2022-03-23
  Administered 2022-03-23: 1000 mL via INTRAVENOUS

## 2022-03-23 MED ORDER — HYDRALAZINE HCL 20 MG/ML IJ SOLN
10.0000 mg | Freq: Once | INTRAMUSCULAR | Status: AC
Start: 2022-03-23 — End: 2022-03-23
  Administered 2022-03-23: 10 mg via INTRAVENOUS
  Filled 2022-03-23: qty 1

## 2022-03-23 NOTE — ED Provider Triage Note (Signed)
Emergency Medicine Provider Triage Evaluation Note  Dana Greer , a 51 y.o. female  was evaluated in triage.  Pt complains of abdominal pain that started this morning at 5.  Associated nausea, vomiting.  Does report smoking marijuana but this is not new.  Says that this happens every couple years.   Review of Systems  Positive: Abdominal pain that is generalized, nausea and vomiting Negative: Urinary or vaginal symptoms  Physical Exam  BP (!) 158/133 (BP Location: Right Arm)   Pulse 68   Temp 97.6 F (36.4 C) (Oral)   Resp 18   LMP 10/16/2019 (Within Days) Comment: had hysterectomy-still has half of her uterus and ovaries  SpO2 100%  Gen:   Awake, no distress   Resp:  Normal effort  MSK:   Moves extremities without difficulty  Other:  Writhing around and pacing in triage  Medical Decision Making  Medically screening exam initiated at 1:46 PM.  Appropriate orders placed.  Dana Greer was informed that the remainder of the evaluation will be completed by another provider, this initial triage assessment does not replace that evaluation, and the importance of remaining in the ED until their evaluation is complete.     Rhae Hammock, PA-C 03/23/22 1423

## 2022-03-23 NOTE — Discharge Instructions (Addendum)
As we discussed, there was some blood in your urine.  Talk with your urologist about this.   Use the suppositories as needed.  Return for any worsening symptoms.

## 2022-03-23 NOTE — ED Provider Notes (Signed)
[facility] DEPT Provider Note   CSN: [id number] Arrival date & time: 03/23/22  1332     History  Chief Complaint  Patient presents with   Abdominal Pain    Dana Greer is a 51 y.o. female with a past medical history of obesity, PCOS, prediabetes and migraines presenting today with abdominal pain, nausea and vomiting that started acutely at 5 this morning.  She is unable to state how many episodes of emesis but says it has been constant.  No diarrhea.  No fevers or chills.  No changes to her diet.  Smokes marijuana daily but has not changed what she usually smokes.  Says that it feels like "muscle cramps."  No history of abdominal surgery.  No urinary or vaginal symptoms.  Says that occasionally she feels diaphoretic.   Abdominal Pain Associated symptoms: nausea and vomiting   Associated symptoms: no chest pain, no chills, no fever, no hematuria and no shortness of breath       Home Medications Prior to Admission medications   Medication Sig Start Date End Date Taking? Authorizing Provider  AIMOVIG 140 MG/ML SOAJ Inject into the skin. 12/22/21   [provider]  ALPRAZolam Duanne Moron) 1 MG tablet Take 1 mg by mouth 4 (four) times daily as needed for anxiety or sleep.     [provider]  amitriptyline (ELAVIL) 25 MG tablet Take 2 tablets (50 mg total) by mouth daily. 05/18/20   Kayleen Memos, DO  cloNIDine (CATAPRES) 0.2 MG tablet Take 0.2 mg by mouth 2 (two) times daily. 05/26/20   [provider]  cyclobenzaprine (FLEXERIL) 10 MG tablet Take 1 tablet (10 mg total) by mouth at bedtime as needed for muscle spasms. 10/05/21   [name], Wynelle Fanny, MD  EPINEPHrine (EPIPEN 2-PAK) 0.3 mg/0.3 mL IJ SOAJ injection Inject 0.3 mg into the muscle as needed for anaphylaxis. 12/30/21   Kennith Gain, MD  etonogestrel (NEXPLANON) 68 MG IMPL implant Inject 1 each into the skin continuous. Reported on 01/27/2016    [provider]  famotidine (PEPCID) 20 MG tablet TAKE 1 TABLET BY MOUTH 2 TIMES DAILY 01/25/22   [name], Wynelle Fanny, MD  Galcanezumab-gnlm [facility]) Inject into the skin.    [provider]  meclizine (ANTIVERT) 25 MG tablet Take 50 mg by mouth as needed for dizziness.    [provider]  meloxicam (MOBIC) 7.5 MG tablet Take 7.5 mg by mouth daily. 09/15/21   [provider]  ondansetron (ZOFRAN) 8 MG tablet Take 8 mg by mouth 3 (three) times daily. 06/24/21   [provider]  promethazine (PHENERGAN) 25 MG tablet TAKE 1 TABLET BY MOUTH EVERY 8 HOURS AS NEEDED FOR NAUSEA AND VOMITING 08/26/21   [name], Wynelle Fanny, MD  PULMICORT FLEXHALER 90 MCG/ACT inhaler Inhale 1-2 puffs into the lungs daily. 02/07/22   [name], Wynelle Fanny, MD  scopolamine (TRANSDERM-SCOP, 1.5 MG,) 1 MG/3DAYS Place 1 patch (1.5 mg total) onto the skin every 3 (three) days. 09/04/20   [name], Wynelle Fanny, MD  topiramate (TOPAMAX) 100 MG tablet TAKE 1 TABLET BY MOUTH TWICE A DAY 11/25/21   [name], Wynelle Fanny, MD  Ubrogepant (UBRELVY PO) Take by mouth.    [provider]  valACYclovir (VALTREX) 500 MG tablet TAKE 1 TABLET BY MOUTH ONCE DAILY 01/27/22   [name], Wynelle Fanny, MD  Vibegron (GEMTESA) 75 MG TABS Take 75 mg by mouth daily. 12/31/21   Stoioff, Ronda Fairly, MD  Vibegron (GEMTESA) 75 MG TABS Take 75 mg by mouth daily. 02/03/22   Stoioff, Ronda Fairly, MD      Allergies    Trazodone and nefazodone, Amoxicillin, and Carafate [sucralfate]    Review of Systems   Review of Systems  Constitutional:  Negative for chills and fever.  Respiratory:  Negative for shortness of breath.   Cardiovascular:  Negative for chest pain.  Gastrointestinal:  Positive for abdominal pain, nausea and vomiting.  Genitourinary:  Negative for hematuria.   Physical Exam Updated Vital Signs BP (!) 194/95   Pulse 80   Temp 97.6 F (36.4 C) (Oral)   Resp 19   LMP 10/16/2019 (Within Days) Comment: had hysterectomy-still has half of her uterus and ovaries   SpO2 98%  Physical Exam Vitals and nursing note reviewed.  Constitutional:      General: She is not in acute distress.    Appearance: Normal appearance. She is not ill-appearing.  HENT:     Head: Normocephalic and atraumatic.     Mouth/Throat:     Pharynx: Oropharynx is clear.  Eyes:     General: No scleral icterus.    Conjunctiva/sclera: Conjunctivae normal.  Cardiovascular:     Rate and Rhythm: Normal rate and regular rhythm.  Pulmonary:     Effort: Pulmonary effort is normal. No respiratory distress.     Breath sounds: No wheezing.  Abdominal:     General: Abdomen is flat. There is no distension.     Palpations: Abdomen is soft.     Tenderness: There is generalized abdominal tenderness.     Hernia: No hernia is present.  Skin:    General: Skin is warm and dry.     Findings: No rash.  Neurological:     Mental Status: She is alert.  Psychiatric:        Mood and Affect: Mood is anxious.    ED Results / Procedures / Treatments   Labs (all labs ordered are listed, but only abnormal results are displayed) Labs Reviewed  COMPREHENSIVE METABOLIC PANEL - Abnormal; Notable for the following components:      Result Value   Potassium 3.4 (*)    Glucose, Bld 169 (*)    Total Protein 8.8 (*)    All other components within normal limits  CBC - Abnormal; Notable for the following components:   WBC 12.0 (*)    All other components within normal limits  URINALYSIS, ROUTINE W REFLEX MICROSCOPIC - Abnormal; Notable for the following components:   Color, Urine STRAW (*)    Glucose, UA 50 (*)    Hgb urine dipstick MODERATE (*)    Ketones, ur 80 (*)    Protein, ur 100 (*)    Bacteria, UA RARE (*)    All other components within normal limits  LIPASE, BLOOD  I-STAT BETA HCG BLOOD, ED ([facility], [facility], [facility] ONLY)    EKG None  Radiology No results found.  Procedures Procedures   Medications Ordered in ED Medications  sodium chloride 0.9 % bolus 1,000 mL (0 mLs Intravenous Stopped  03/23/22 1759)  metoCLOPramide (REGLAN) injection 10 mg (10 mg Intravenous Given 03/23/22 1547)  morphine (PF) 4 MG/ML injection 4 mg (4 mg Intravenous Given 03/23/22 1547)  hydrALAZINE (APRESOLINE) injection 10 mg (10 mg Intravenous Given 03/23/22 1641)  cloNIDine (CATAPRES) tablet 0.2 mg (0.2 mg Oral Given 03/23/22 1804)  prochlorperazine (COMPAZINE) injection 10 mg (10 mg Intravenous Given 03/23/22 1804)  sodium chloride 0.9 % bolus 1,000 mL (1,000  mLs Intravenous New Bag/Given 03/23/22 1811)    ED Course/ Medical Decision Making/ A&P Clinical Course as of 03/23/22 1917  Wed Mar 23, 2022  1649 Patient reevaluated, says her pain is now 2-3.  Nausea is better. [MR]  9629 Reevaluated, continues to be hypertensive in 200s over 110s.  Continues with nausea but denies pain [MR]    Clinical Course User Index [MR] Balbina Depace, Cecilio Asper, PA-C                           Medical Decision Making Amount and/or Complexity of Data Reviewed Labs: ordered.  Risk Prescription drug management.   This patient presents to the ED for concern of abdominal pain, NV. The differential diagnosis for generalized abdominal pain includes, but is not limited to AAA, gastroenteritis, appendicitis, Bowel obstruction, Bowel perforation. Gastroparesis, DKA, Hernia, Inflammatory bowel disease, mesenteric ischemia, pancreatitis, peritonitis SBP, volvulus.    This is not an exhaustive differential.    Past Medical History / Co-morbidities / Social History: Hypertension, marijuana use   Additional history: Additional history obtained from patient's mother who reports that this happens intermittently.  Per external chart review, patient sees [facility] neurology for microhematuria.  She has had multiple cystoscopies, most recent last month that showed no bladder mucosal abnormalities.  Also with negative CTU.    Physical Exam: Physical exam performed. The pertinent findings include: Generalized tenderness, patient rolling  around on the stretcher.  Lab Tests: I ordered, and personally interpreted labs.  The pertinent results include:  -Hematuria.  Normal kidney function. -WBC 12   Imaging Studies: Patient with nonfocal exam.  CT considered but with her physical exam and history I do not believe this is indicated at this time.   Medications: I ordered medication including Reglan and morphine. She was reevaluated and rates her pain a 2-3.  Continues with nausea so compazine was ordered. Continues to be hypertensive, hydralazine ordered.  Patient reports she takes clonidine twice daily at home but has not been able to hold it down since this began this morning.  On reevaluation, blood pressure 150s/1102. Patient denies headache, chest or back pain. Reports feeling stable to go home.    Disposition: 51 year old female presenting today with abdominal pain, nausea and vomiting.  Started acutely.  Abdominal pain was generalized.  Physical exam without peritoneal signs, no focal tenderness.  No imaging was pursued.  Patient's symptoms resolved after morphine and Reglan.  She was p.o. challenged successfully.  After consideration of the diagnostic results and the patients response to treatment, I feel that she is stable for discharge home.  Has a chronic history of hematuria, kidney function stable and will continue to follow with urology.  Final Clinical Impression(s) / ED Diagnoses Final diagnoses:  Hematuria, unspecified type  Nausea and vomiting, unspecified vomiting type    Rx / DC Orders ED Discharge Orders          Ordered    promethazine (PHENERGAN) 25 MG suppository  Every 6 hours PRN        03/23/22 1906           Results and diagnoses were explained to the patient. Return precautions discussed in full. Patient had no additional questions and expressed complete understanding.   This chart was dictated using voice recognition software.  Despite best efforts to proofread,  errors can occur which  can change the documentation meaning.    Rhae Hammock, [place] 03/23/22 [phone]  Jeanell Sparrow, DO 03/24/22 1130

## 2022-03-23 NOTE — ED Triage Notes (Signed)
Pt BIB EMS from home. Pt endorses abdominal pain and N/V since this morning. Pt reports this has happened intermittently over the last 10 years and states that they never know what is wrong. Pt has been unable to keep anything down including her BP medication.   20G RAC '4mg'$  Zofran

## 2022-04-08 ENCOUNTER — Encounter: Payer: Self-pay | Admitting: Family Medicine

## 2022-04-25 ENCOUNTER — Encounter: Payer: Self-pay | Admitting: Family Medicine

## 2022-04-25 ENCOUNTER — Other Ambulatory Visit: Payer: Self-pay | Admitting: Family Medicine

## 2022-04-25 DIAGNOSIS — Z1231 Encounter for screening mammogram for malignant neoplasm of breast: Secondary | ICD-10-CM

## 2022-04-26 MED ORDER — PROMETHAZINE HCL 25 MG RE SUPP: 25.0000 mg | Freq: Four times a day (QID) | RECTAL | 0 refills | Status: DC | PRN

## 2022-05-05 ENCOUNTER — Inpatient Hospital Stay
Admission: EM | Admit: 2022-05-05 | Discharge: 2022-05-07 | DRG: 922 | Disposition: A | Discharge status: Home / Self Care | Payer: Medicare Other | Attending: Internal Medicine | Admitting: Internal Medicine

## 2022-05-05 ENCOUNTER — Encounter: Payer: Self-pay | Admitting: Emergency Medicine

## 2022-05-05 ENCOUNTER — Other Ambulatory Visit: Payer: Self-pay

## 2022-05-05 ENCOUNTER — Emergency Department: Payer: Medicare Other

## 2022-05-05 ENCOUNTER — Inpatient Hospital Stay: Payer: Medicare Other

## 2022-05-05 DIAGNOSIS — M797 Fibromyalgia: Secondary | ICD-10-CM | POA: Diagnosis present

## 2022-05-05 DIAGNOSIS — Z20822 Contact with and (suspected) exposure to covid-19: Secondary | ICD-10-CM | POA: Diagnosis present

## 2022-05-05 DIAGNOSIS — Z6832 Body mass index (BMI) 32.0-32.9, adult: Secondary | ICD-10-CM

## 2022-05-05 DIAGNOSIS — M6282 Rhabdomyolysis: Secondary | ICD-10-CM | POA: Diagnosis not present

## 2022-05-05 DIAGNOSIS — R1310 Dysphagia, unspecified: Secondary | ICD-10-CM | POA: Diagnosis present

## 2022-05-05 DIAGNOSIS — Z791 Long term (current) use of non-steroidal anti-inflammatories (NSAID): Secondary | ICD-10-CM

## 2022-05-05 DIAGNOSIS — M419 Scoliosis, unspecified: Secondary | ICD-10-CM | POA: Diagnosis present

## 2022-05-05 DIAGNOSIS — K580 Irritable bowel syndrome with diarrhea: Secondary | ICD-10-CM | POA: Diagnosis present

## 2022-05-05 DIAGNOSIS — R9431 Abnormal electrocardiogram [ECG] [EKG]: Secondary | ICD-10-CM | POA: Diagnosis present

## 2022-05-05 DIAGNOSIS — Z880 Allergy status to penicillin: Secondary | ICD-10-CM

## 2022-05-05 DIAGNOSIS — A419 Sepsis, unspecified organism: Secondary | ICD-10-CM | POA: Diagnosis not present

## 2022-05-05 DIAGNOSIS — Z888 Allergy status to other drugs, medicaments and biological substances status: Secondary | ICD-10-CM

## 2022-05-05 DIAGNOSIS — X30XXXA Exposure to excessive natural heat, initial encounter: Secondary | ICD-10-CM | POA: Diagnosis not present

## 2022-05-05 DIAGNOSIS — E669 Obesity, unspecified: Secondary | ICD-10-CM | POA: Diagnosis present

## 2022-05-05 DIAGNOSIS — D849 Immunodeficiency, unspecified: Secondary | ICD-10-CM | POA: Diagnosis present

## 2022-05-05 DIAGNOSIS — Z86010 Personal history of colonic polyps: Secondary | ICD-10-CM | POA: Diagnosis not present

## 2022-05-05 DIAGNOSIS — Z7951 Long term (current) use of inhaled steroids: Secondary | ICD-10-CM

## 2022-05-05 DIAGNOSIS — G9341 Metabolic encephalopathy: Secondary | ICD-10-CM | POA: Diagnosis present

## 2022-05-05 DIAGNOSIS — I5031 Acute diastolic (congestive) heart failure: Secondary | ICD-10-CM | POA: Diagnosis not present

## 2022-05-05 DIAGNOSIS — T675XXS Heat exhaustion, unspecified, sequela: Secondary | ICD-10-CM | POA: Diagnosis present

## 2022-05-05 DIAGNOSIS — G43909 Migraine, unspecified, not intractable, without status migrainosus: Secondary | ICD-10-CM | POA: Diagnosis present

## 2022-05-05 DIAGNOSIS — E860 Dehydration: Secondary | ICD-10-CM | POA: Diagnosis present

## 2022-05-05 DIAGNOSIS — I10 Essential (primary) hypertension: Secondary | ICD-10-CM | POA: Diagnosis present

## 2022-05-05 DIAGNOSIS — F32A Depression, unspecified: Secondary | ICD-10-CM | POA: Diagnosis present

## 2022-05-05 DIAGNOSIS — E872 Acidosis, unspecified: Secondary | ICD-10-CM | POA: Diagnosis present

## 2022-05-05 DIAGNOSIS — T675XXA Heat exhaustion, unspecified, initial encounter: Secondary | ICD-10-CM | POA: Diagnosis present

## 2022-05-05 DIAGNOSIS — F419 Anxiety disorder, unspecified: Secondary | ICD-10-CM | POA: Diagnosis present

## 2022-05-05 DIAGNOSIS — R197 Diarrhea, unspecified: Secondary | ICD-10-CM | POA: Diagnosis present

## 2022-05-05 DIAGNOSIS — Z8619 Personal history of other infectious and parasitic diseases: Secondary | ICD-10-CM

## 2022-05-05 DIAGNOSIS — R531 Weakness: Secondary | ICD-10-CM

## 2022-05-05 DIAGNOSIS — R000 Tachycardia, unspecified: Secondary | ICD-10-CM | POA: Diagnosis not present

## 2022-05-05 DIAGNOSIS — K219 Gastro-esophageal reflux disease without esophagitis: Secondary | ICD-10-CM | POA: Diagnosis not present

## 2022-05-05 DIAGNOSIS — E538 Deficiency of other specified B group vitamins: Secondary | ICD-10-CM | POA: Diagnosis not present

## 2022-05-05 DIAGNOSIS — N390 Urinary tract infection, site not specified: Secondary | ICD-10-CM | POA: Diagnosis present

## 2022-05-05 DIAGNOSIS — F4323 Adjustment disorder with mixed anxiety and depressed mood: Secondary | ICD-10-CM | POA: Diagnosis present

## 2022-05-05 DIAGNOSIS — N179 Acute kidney failure, unspecified: Secondary | ICD-10-CM | POA: Diagnosis present

## 2022-05-05 DIAGNOSIS — Z8673 Personal history of transient ischemic attack (TIA), and cerebral infarction without residual deficits: Secondary | ICD-10-CM | POA: Diagnosis not present

## 2022-05-05 DIAGNOSIS — E8729 Other acidosis: Secondary | ICD-10-CM

## 2022-05-05 DIAGNOSIS — Z90710 Acquired absence of both cervix and uterus: Secondary | ICD-10-CM | POA: Diagnosis not present

## 2022-05-05 DIAGNOSIS — E876 Hypokalemia: Secondary | ICD-10-CM | POA: Diagnosis present

## 2022-05-05 DIAGNOSIS — R079 Chest pain, unspecified: Secondary | ICD-10-CM | POA: Diagnosis not present

## 2022-05-05 DIAGNOSIS — Z79899 Other long term (current) drug therapy: Secondary | ICD-10-CM

## 2022-05-05 DIAGNOSIS — M1990 Unspecified osteoarthritis, unspecified site: Secondary | ICD-10-CM | POA: Diagnosis present

## 2022-05-05 DIAGNOSIS — R4182 Altered mental status, unspecified: Secondary | ICD-10-CM | POA: Diagnosis not present

## 2022-05-05 LAB — CBC WITH DIFFERENTIAL/PLATELET
Abs Immature Granulocytes: 0.11 10*3/uL — ABNORMAL HIGH (ref 0.00–0.07)
Basophils Absolute: 0 10*3/uL (ref 0.0–0.1)
Basophils Relative: 0 %
Eosinophils Absolute: 0 10*3/uL (ref 0.0–0.5)
Eosinophils Relative: 0 %
HCT: 51.9 % — ABNORMAL HIGH (ref 36.0–46.0)
Hemoglobin: 17.8 g/dL — ABNORMAL HIGH (ref 12.0–15.0)
Immature Granulocytes: 1 %
Lymphocytes Relative: 12 %
Lymphs Abs: 2.1 10*3/uL (ref 0.7–4.0)
MCH: 31.8 pg (ref 26.0–34.0)
MCHC: 34.3 g/dL (ref 30.0–36.0)
MCV: 92.7 fL (ref 80.0–100.0)
Monocytes Absolute: 0.9 10*3/uL (ref 0.1–1.0)
Monocytes Relative: 5 %
Neutro Abs: 15.4 10*3/uL — ABNORMAL HIGH (ref 1.7–7.7)
Neutrophils Relative %: 82 %
Platelets: 188 10*3/uL (ref 150–400)
RBC: 5.6 MIL/uL — ABNORMAL HIGH (ref 3.87–5.11)
RDW: 13.4 % (ref 11.5–15.5)
WBC: 18.6 10*3/uL — ABNORMAL HIGH (ref 4.0–10.5)
nRBC: 0 % (ref 0.0–0.2)

## 2022-05-05 LAB — COMPREHENSIVE METABOLIC PANEL
ALT: 24 U/L (ref 0–44)
AST: 49 U/L — ABNORMAL HIGH (ref 15–41)
Albumin: 5.1 g/dL — ABNORMAL HIGH (ref 3.5–5.0)
Alkaline Phosphatase: 73 U/L (ref 38–126)
Anion gap: 17 — ABNORMAL HIGH (ref 5–15)
BUN: 31 mg/dL — ABNORMAL HIGH (ref 6–20)
CO2: 21 mmol/L — ABNORMAL LOW (ref 22–32)
Calcium: 10.2 mg/dL (ref 8.9–10.3)
Chloride: 101 mmol/L (ref 98–111)
Creatinine, Ser: 2.73 mg/dL — ABNORMAL HIGH (ref 0.44–1.00)
GFR, Estimated: 20 mL/min — ABNORMAL LOW (ref >60)
Glucose, Bld: 148 mg/dL — ABNORMAL HIGH (ref 70–99)
Potassium: 3.6 mmol/L (ref 3.5–5.1)
Sodium: 139 mmol/L (ref 135–145)
Total Bilirubin: 1.5 mg/dL — ABNORMAL HIGH (ref 0.3–1.2)
Total Protein: 9.4 g/dL — ABNORMAL HIGH (ref 6.5–8.1)

## 2022-05-05 LAB — CK: Total CK: 1934 U/L — ABNORMAL HIGH (ref 38–234)

## 2022-05-05 LAB — URINALYSIS, ROUTINE W REFLEX MICROSCOPIC
Bilirubin Urine: NEGATIVE
Glucose, UA: NEGATIVE mg/dL
Ketones, ur: 5 mg/dL — AB
Leukocytes,Ua: NEGATIVE
Nitrite: NEGATIVE
Protein, ur: >=300 mg/dL — AB
Specific Gravity, Urine: 1.018 (ref 1.005–1.030)
pH: 5 (ref 5.0–8.0)

## 2022-05-05 LAB — LIPASE, BLOOD: Lipase: 25 U/L (ref 11–51)

## 2022-05-05 LAB — SARS CORONAVIRUS 2 BY RT PCR: SARS Coronavirus 2 by RT PCR: NEGATIVE

## 2022-05-05 LAB — LACTIC ACID, PLASMA
Lactic Acid, Venous: 2.2 mmol/L (ref 0.5–1.9)
Lactic Acid, Venous: 2.9 mmol/L (ref 0.5–1.9)

## 2022-05-05 MED ORDER — ACETAMINOPHEN 650 MG RE SUPP: 650.0000 mg | Freq: Four times a day (QID) | RECTAL | Status: DC | PRN

## 2022-05-05 MED ORDER — LABETALOL HCL 5 MG/ML IV SOLN
10.0000 mg | Freq: Once | INTRAVENOUS | Status: AC
  Administered 2022-05-05: 10 mg via INTRAVENOUS
  Filled 2022-05-05: qty 4

## 2022-05-05 MED ORDER — SODIUM CHLORIDE 0.9 % IV SOLN
2.0000 g | INTRAVENOUS | Status: DC
  Administered 2022-05-06: 2 g via INTRAVENOUS
  Filled 2022-05-05: qty 20

## 2022-05-05 MED ORDER — CEFEPIME HCL 2 G IV SOLR
2.0000 g | Freq: Once | INTRAVENOUS | Status: AC
  Administered 2022-05-05: 2 g via INTRAVENOUS
  Filled 2022-05-05: qty 12.5

## 2022-05-05 MED ORDER — LACTATED RINGERS IV BOLUS
1000.0000 mL | Freq: Once | INTRAVENOUS | Status: AC
Start: 2022-05-05 — End: 2022-05-05
  Administered 2022-05-05: 1000 mL via INTRAVENOUS

## 2022-05-05 MED ORDER — ENOXAPARIN SODIUM 30 MG/0.3ML IJ SOSY
30.0000 mg | PREFILLED_SYRINGE | INTRAMUSCULAR | Status: DC
  Administered 2022-05-06: 30 mg via SUBCUTANEOUS
  Filled 2022-05-05: qty 0.3

## 2022-05-05 MED ORDER — ONDANSETRON HCL 4 MG PO TABS
4.0000 mg | ORAL_TABLET | Freq: Four times a day (QID) | ORAL | Status: DC | PRN
  Administered 2022-05-06: 4 mg via ORAL
  Filled 2022-05-05: qty 1

## 2022-05-05 MED ORDER — LACTATED RINGERS IV BOLUS
1000.0000 mL | Freq: Once | INTRAVENOUS | Status: AC
  Administered 2022-05-05: 1000 mL via INTRAVENOUS

## 2022-05-05 MED ORDER — ACETAMINOPHEN 325 MG PO TABS
650.0000 mg | ORAL_TABLET | Freq: Four times a day (QID) | ORAL | Status: DC | PRN
  Administered 2022-05-07: 650 mg via ORAL
  Filled 2022-05-05: qty 2

## 2022-05-05 MED ORDER — ONDANSETRON HCL 4 MG/2ML IJ SOLN
4.0000 mg | Freq: Four times a day (QID) | INTRAMUSCULAR | Status: DC | PRN
  Administered 2022-05-06: 4 mg via INTRAVENOUS
  Filled 2022-05-05: qty 2

## 2022-05-05 MED ORDER — LACTATED RINGERS IV SOLN
INTRAVENOUS | Status: AC
Start: 2022-05-05 — End: 2022-05-06

## 2022-05-05 MED ORDER — VANCOMYCIN HCL IN DEXTROSE 1-5 GM/200ML-% IV SOLN
1000.0000 mg | Freq: Once | INTRAVENOUS | Status: AC
  Administered 2022-05-05: 1000 mg via INTRAVENOUS
  Filled 2022-05-05: qty 200

## 2022-05-05 MED ORDER — FENTANYL CITRATE PF 50 MCG/ML IJ SOSY
50.0000 ug | PREFILLED_SYRINGE | Freq: Once | INTRAMUSCULAR | Status: AC
  Administered 2022-05-05: 50 ug via INTRAVENOUS
  Filled 2022-05-05: qty 1

## 2022-05-05 MED ORDER — METRONIDAZOLE 500 MG/100ML IV SOLN
500.0000 mg | Freq: Once | INTRAVENOUS | Status: AC
  Administered 2022-05-05: 500 mg via INTRAVENOUS
  Filled 2022-05-05: qty 100

## 2022-05-05 MED ORDER — HYDROCODONE-ACETAMINOPHEN 5-325 MG PO TABS
1.0000 | ORAL_TABLET | ORAL | Status: DC | PRN
  Administered 2022-05-06: 1 via ORAL
  Administered 2022-05-06: 2 via ORAL
  Filled 2022-05-05: qty 1
  Filled 2022-05-05: qty 2

## 2022-05-05 NOTE — ED Triage Notes (Signed)
Patient with weakness, diarrhea--has ibs.  Her mother says she thought it was because air cond went out at home.  She has been staying in hotel for couple days, but she is stil not eating and acting lethargic.

## 2022-05-05 NOTE — Assessment & Plan Note (Signed)
IV Rocephin and follow cultures

## 2022-05-05 NOTE — Assessment & Plan Note (Addendum)
No previous history of renal disease.  With IV fluids, creatinine down to 1.71.  Secondary to rhabdomyolysis.  Continue IV fluids.

## 2022-05-05 NOTE — Assessment & Plan Note (Signed)
Sepsis criteria includes tachycardia, tachypnea, leukocytosis and lactic acidosis with source of infection Continue sepsis fluids and treat acute infection Follow cultures

## 2022-05-05 NOTE — ED Notes (Signed)
Pt with CT. 

## 2022-05-05 NOTE — H&P (Signed)
History and Physical    Patient: Dana Greer JIR:[id number] DOB: Aug 19, 1971 DOA: 05/05/2022 DOS: the patient was seen and examined on 05/05/2022 PCP: Abner Greenspan, MD  Patient coming from: Home  Chief Complaint:  Chief Complaint  Patient presents with   Weakness    HPI: Dana Greer is a 51 y.o. female with medical history significant for Hypertension, who presents to the ED with a several day history of weakness,  and lethargy starting after he [facility] went out at home.  Her mother noticed that over the last two days she has had trouble getting her words out and seemed to be talking out of her head. She checked her into a motel but symptoms continued in spite of improved ambient conditions.  She has had no cough fever or chills, nausea vomiting or abdominal pain. She endorses a few days of diarrhea but this has resolved. She remains lethargic.  Mother at bedside contributes the history. ED course and data review: On arrival tachycardic to 156 with BP 135/105 and otherwise normal vitals. Labs WBC 18,000 with lactic acid 2.9.  Hemoglobin 17.8.  Creatinine 2.73 up from baseline of 0.97 with anion gap of 17 and bicarb 21.  Lipase normal, AST T49 and total bili 1.5.  Total CK19 34.  COVID-negative.  Urinalysis with many bacteria consistent with UTI. EKG, personally viewed and interpreted with sinus tachycardia at 159 with nonspecific ST-T wave changes Chest x-ray with no acute chest findings  Patient started on IV fluid resuscitation, cefepime Flagyl and vancomycin for sepsis of unknown source.  Hospitalist consulted for admission.     Past Medical History:  Diagnosis Date   Arthritis    Depression    Fibromyalgia    Headache(784.0)    Hx of adenomatous polyp of colon 02/14/2020   Hypertension    Immune deficiency disorder ([place])    Migraine headache    Shingles    Stroke ([place])    Thrombocytopenia ([place]) 10/01/2012   Past Surgical History:  Procedure Laterality Date    ABDOMINAL HYSTERECTOMY     CARPAL TUNNEL RELEASE Left 09/25/2018   Procedure: LEFT CARPAL TUNNEL RELEASE;  Surgeon: Daryll Brod, MD;  Location: [place];  Service: Orthopedics;  Laterality: Left;   CESAREAN SECTION  2008   x1   PARTIAL HYSTERECTOMY  2012   due to fibroid   [place] EXTRACTION  1995   Social History:  reports that she has never smoked. She has never used smokeless tobacco. She reports current alcohol use. She reports current drug use. Drug: Marijuana.  Allergies  Allergen Reactions   Trazodone And Nefazodone Nausea And Vomiting   Amoxicillin Other (See Comments)    Yeast infection    Carafate [Sucralfate] Nausea And Vomiting    Family History  Problem Relation Age of Onset   Diabetes Father    COPD Father    Healthy Mother    Breast cancer Paternal Aunt    Colon cancer Paternal Grandmother    Rectal cancer Paternal Grandmother    Lung cancer Paternal Uncle    Lung cancer Paternal Aunt    Esophageal cancer Cousin    Stomach cancer Neg Hx     Prior to Admission medications   Medication Sig Start Date End Date Taking? Authorizing Provider  AIMOVIG 140 MG/ML SOAJ Inject into the skin. 12/22/21   [provider]  ALPRAZolam Duanne Moron) 1 MG tablet Take 1 mg by  mouth 4 (four) times daily as needed for anxiety or sleep.     [provider]  amitriptyline (ELAVIL) 25 MG tablet Take 2 tablets (50 mg total) by mouth daily. 05/18/20   Kayleen Memos, DO  cloNIDine (CATAPRES) 0.2 MG tablet Take 0.2 mg by mouth 2 (two) times daily. 05/26/20   [provider]  cyclobenzaprine (FLEXERIL) 10 MG tablet Take 1 tablet (10 mg total) by mouth at bedtime as needed for muscle spasms. 10/05/21   [name], Wynelle Fanny, MD  EPINEPHrine (EPIPEN 2-PAK) 0.3 mg/0.3 mL IJ SOAJ injection Inject 0.3 mg into the muscle as needed for anaphylaxis. 12/30/21   Kennith Gain, MD  etonogestrel (NEXPLANON) 68 MG IMPL  implant Inject 1 each into the skin continuous. Reported on 01/27/2016    [provider]  famotidine (PEPCID) 20 MG tablet TAKE 1 TABLET BY MOUTH 2 TIMES DAILY 01/25/22   [name], Wynelle Fanny, MD  Galcanezumab-[facility]) Inject into the skin.    [provider]  meclizine (ANTIVERT) 25 MG tablet Take 50 mg by mouth as needed for dizziness.    [provider]  meloxicam (MOBIC) 7.5 MG tablet Take 7.5 mg by mouth daily. 09/15/21   [provider]  ondansetron (ZOFRAN) 8 MG tablet Take 8 mg by mouth 3 (three) times daily. 06/24/21   [provider]  promethazine (PHENERGAN) 25 MG suppository Place 1 suppository (25 mg total) rectally every 6 (six) hours as needed for nausea or vomiting. 04/26/22   [name], Wynelle Fanny, MD  promethazine (PHENERGAN) 25 MG tablet TAKE 1 TABLET BY MOUTH EVERY 8 HOURS AS NEEDED FOR NAUSEA AND VOMITING 08/26/21   [name], Wynelle Fanny, MD  PULMICORT FLEXHALER 90 MCG/ACT inhaler Inhale 1-2 puffs into the lungs daily. 02/07/22   [name], Wynelle Fanny, MD  scopolamine (TRANSDERM-SCOP, 1.5 MG,) 1 MG/3DAYS Place 1 patch (1.5 mg total) onto the skin every 3 (three) days. 09/04/20   [name], Wynelle Fanny, MD  topiramate (TOPAMAX) 100 MG tablet TAKE 1 TABLET BY MOUTH TWICE A DAY 11/25/21   [name], Wynelle Fanny, MD  Ubrogepant (UBRELVY PO) Take by mouth.    [provider]  valACYclovir (VALTREX) 500 MG tablet TAKE 1 TABLET BY MOUTH ONCE DAILY 01/27/22   [name], Wynelle Fanny, MD  Vibegron (GEMTESA) 75 MG TABS Take 75 mg by mouth daily. 12/31/21   Stoioff, Ronda Fairly, MD  Vibegron (GEMTESA) 75 MG TABS Take 75 mg by mouth daily. 02/03/22   Abbie Sons, MD    Physical Exam: Vitals:   05/05/22 1757 05/05/22 1830 05/05/22 1846 05/05/22 2100  BP: 117/89 (!) 119/93  (!) 132/99  Pulse: (!) 128  95 (!) 115  Resp: 19 12 (!) 25 (!) 21  Temp:    98.3 F (36.8 C)  TempSrc:    Oral  SpO2: 97%  99% 99%  Weight:       Physical Exam Vitals and nursing note reviewed.   Constitutional:      General: She is sleeping. She is not in acute distress. HENT:     Head: Normocephalic and atraumatic.  Cardiovascular:     Rate and Rhythm: Normal rate and regular rhythm.     Heart sounds: Normal heart sounds.  Pulmonary:     Effort: Pulmonary effort is normal.     Breath sounds: Normal breath sounds.  Abdominal:     Palpations: Abdomen is soft.     Tenderness: There is no abdominal tenderness.  Neurological:  Mental Status: She is easily aroused. Mental status is at baseline. She is lethargic.     Labs on Admission: I have personally reviewed following labs and imaging studies  CBC: Recent Labs  Lab 05/05/22 1554  WBC 18.6*  NEUTROABS 15.4*  HGB 17.8*  HCT 51.9*  MCV 92.7  PLT 825   Basic Metabolic Panel: Recent Labs  Lab 05/05/22 1554  NA 139  K 3.6  CL 101  CO2 21*  GLUCOSE 148*  BUN 31*  CREATININE 2.73*  CALCIUM 10.2   GFR: Estimated Creatinine Clearance: 22.9 mL/min (A) (by C-G formula based on SCr of 2.73 mg/dL (H)). Liver Function Tests: Recent Labs  Lab 05/05/22 1554  AST 49*  ALT 24  ALKPHOS 73  BILITOT 1.5*  PROT 9.4*  ALBUMIN 5.1*   Recent Labs  Lab 05/05/22 1554  LIPASE 25   No results for input(s): "AMMONIA" in the last 168 hours. Coagulation Profile: No results for input(s): "INR", "PROTIME" in the last 168 hours. Cardiac Enzymes: Recent Labs  Lab 05/05/22 1605  CKTOTAL 1,934*   BNP (last 3 results) No results for input(s): "PROBNP" in the last 8760 hours. HbA1C: No results for input(s): "HGBA1C" in the last 72 hours. CBG: No results for input(s): "GLUCAP" in the last 168 hours. Lipid Profile: No results for input(s): "CHOL", "HDL", "LDLCALC", "TRIG", "CHOLHDL", "LDLDIRECT" in the last 72 hours. Thyroid Function Tests: No results for input(s): "TSH", "T4TOTAL", "FREET4", "T3FREE", "THYROIDAB" in the last 72 hours. Anemia Panel: No results for input(s): "VITAMINB12", "FOLATE", "FERRITIN",  "TIBC", "IRON", "RETICCTPCT" in the last 72 hours. Urine analysis:    Component Value Date/Time   COLORURINE YELLOW (A) 05/05/2022 2040   APPEARANCEUR CLOUDY (A) 05/05/2022 2040   APPEARANCEUR Clear 02/03/2022 1112   LABSPEC 1.018 05/05/2022 2040   PHURINE 5.0 05/05/2022 2040   GLUCOSEU NEGATIVE 05/05/2022 2040   HGBUR LARGE (A) 05/05/2022 2040   HGBUR moderate 12/10/2007 1434   BILIRUBINUR NEGATIVE 05/05/2022 2040   BILIRUBINUR Negative 02/03/2022 1112   KETONESUR 5 (A) 05/05/2022 2040   PROTEINUR >=300 (A) 05/05/2022 2040   UROBILINOGEN 0.2 08/12/2021 1157   UROBILINOGEN 1.0 05/21/2015 0445   NITRITE NEGATIVE 05/05/2022 2040   LEUKOCYTESUR NEGATIVE 05/05/2022 2040    Radiological Exams on Admission: DG Chest Portable 1 View  Result Date: 05/05/2022 CLINICAL DATA:  Chest pain.  Weakness. EXAM: PORTABLE CHEST 1 VIEW COMPARISON:  Radiograph 05/15/2020 FINDINGS: The heart is normal in size.The cardiomediastinal contours are normal. The lungs are clear. Pulmonary vasculature is normal. No consolidation, pleural effusion, or pneumothorax. Moderate scoliotic curvature of the thoracic spine. No acute osseous abnormalities are seen. IMPRESSION: No acute chest findings. Electronically Signed   By: Keith Rake M.D.   On: 05/05/2022 17:41     Data Reviewed: Relevant notes from primary care and specialist visits, past discharge summaries as available in EHR, including Care Everywhere. Prior diagnostic testing as pertinent to current admission diagnoses Updated medications and problem lists for reconciliation ED course, including vitals, labs, imaging, treatment and response to treatment Triage notes, nursing and pharmacy notes and ED provider's notes Notable results as noted in HPI   Assessment and Plan: * Sepsis ([place]) Sepsis criteria includes tachycardia, tachypnea, leukocytosis and lactic acidosis with source of infection Continue sepsis fluids and treat acute infection Follow  cultures  Acute metabolic encephalopathy Secondary to multiple acute medical issues as described Neuro checks Fall and aspiration precautions Head CT ordered  Heat exhaustion Possible heat exhaustion given history of  AC at home going out in 90 degree weather  AKI (acute kidney injury) ([place]) Creatinine 2.73 up from baseline of 0.97 IV hydration and monitor renal function  Urinary tract infection IV Rocephin and follow cultures  Diarrhea Monitor for diarrhea and GI panel if still ongoing IV hydration  High anion gap metabolic acidosis Suspect related to lactic acidosis, diarrhea, sepsis IV fluid resuscitation and monitor  Rhabdomyolysis IV hydration and monitor  Generalized weakness Secondary to all the above acute conditions. Treat the above and get PT eval        DVT prophylaxis: Lovenox  Consults: none  Advance Care Planning:   Code Status: Prior   Family Communication: mother at bedside  Disposition Plan: Back to previous home environment  Severity of Illness: The appropriate patient status for this patient is INPATIENT. Inpatient status is judged to be reasonable and necessary in order to provide the required intensity of service to ensure the patient's safety. The patient's presenting symptoms, physical exam findings, and initial radiographic and laboratory data in the context of their chronic comorbidities is felt to place them at high risk for further clinical deterioration. Furthermore, it is not anticipated that the patient will be medically stable for discharge from the hospital within 2 midnights of admission.   * I certify that at the point of admission it is my clinical judgment that the patient will require inpatient hospital care spanning beyond 2 midnights from the point of admission due to high intensity of service, high risk for further deterioration and high frequency of surveillance required.*  Author: Athena Masse, MD 05/05/2022 10:13  PM  For on call review [email or web address].

## 2022-05-05 NOTE — ED Provider Notes (Signed)
[facility] Provider Note    Event Date/Time   First MD Initiated Contact with Patient 05/05/22 1600     (approximate)   History   Weakness   HPI  Dana Greer is a 51 y.o. female  who presents to the emergency department today because of concern for feeling unwell and possible heat exhaustion. Some history is obtained by mother who is at bedside. The patient's [facility] went off in her home two days ago. Mother felt like she had gotten heat exhaustion so paid for her to stay in a hotel last night. Today however the patient has not been feeling any better. She does complain of some chest pain and decreased appetite. Denies any fevers. Denies similar symptoms in the past.   Physical Exam   Triage Vital Signs: ED Triage Vitals  Enc Vitals Group     BP 05/05/22 1533 (!) 135/105     Pulse Rate 05/05/22 1533 (!) 156     Resp 05/05/22 1533 16     Temp 05/05/22 1533 98.6 F (37 C)     Temp Source 05/05/22 1533 Oral     SpO2 05/05/22 1533 100 %     Weight 05/05/22 1534 170 lb (77.1 kg)     Height --      Head Circumference --      Peak Flow --      Pain Score 05/05/22 1533 0     Pain Loc --      Pain Edu? --      Excl. in [place]? --     Most recent vital signs: Vitals:   05/05/22 1533 05/05/22 1545  BP: (!) 135/105 (!) 112/99  Pulse: (!) 156 (!) 153  Resp: 16 (!) 21  Temp: 98.6 F (37 C)   SpO2: 100% 100%   General: Awake, alert. CV:  Good peripheral perfusion. Tachycardia. Regular rhythm. Resp:  Normal effort. Lungs clear. Abd:  No distention. Mildly tender in the upper abdomen.    ED Results / Procedures / Treatments   Labs (all labs ordered are listed, but only abnormal results are displayed) Labs Reviewed  LACTIC ACID, PLASMA - Abnormal; Notable for the following components:      Result Value   Lactic Acid, Venous 2.2 (*)    All other components within normal limits  LACTIC ACID, PLASMA - Abnormal; Notable for the following  components:   Lactic Acid, Venous 2.9 (*)    All other components within normal limits  CBC WITH DIFFERENTIAL/PLATELET - Abnormal; Notable for the following components:   WBC 18.6 (*)    RBC 5.60 (*)    Hemoglobin 17.8 (*)    HCT 51.9 (*)    Neutro Abs 15.4 (*)    Abs Immature Granulocytes 0.11 (*)    All other components within normal limits  COMPREHENSIVE METABOLIC PANEL - Abnormal; Notable for the following components:   CO2 21 (*)    Glucose, Bld 148 (*)    BUN 31 (*)    Creatinine, Ser 2.73 (*)    Total Protein 9.4 (*)    Albumin 5.1 (*)    AST 49 (*)    Total Bilirubin 1.5 (*)    GFR, Estimated 20 (*)    Anion gap 17 (*)    All other components within normal limits  URINALYSIS, ROUTINE W REFLEX MICROSCOPIC - Abnormal; Notable for the following components:   Color, Urine YELLOW (*)    APPearance CLOUDY (*)  Hgb urine dipstick LARGE (*)    Ketones, ur 5 (*)    Protein, ur >=300 (*)    Bacteria, UA MANY (*)    All other components within normal limits  CK - Abnormal; Notable for the following components:   Total CK 1,934 (*)    All other components within normal limits  SARS CORONAVIRUS 2 BY RT PCR  CULTURE, BLOOD (ROUTINE X 2)  CULTURE, BLOOD (ROUTINE X 2)  LIPASE, BLOOD     EKG  I, Nance Pear, attending physician, personally viewed and interpreted this EKG  EKG Time: 1530 Rate: 159 Rhythm: sinus tachycardia Axis: normal Intervals: qtc 501 QRS: narrow ST changes: no st elevation Impression: abnormal ekg   RADIOLOGY I independently interpreted and visualized the CXR. My interpretation: scoliosis, no pneumonia. Radiology interpretation:  IMPRESSION:  No acute chest findings.     PROCEDURES:  Critical Care performed: Yes  CRITICAL CARE Performed by: Nance Pear   Total critical care time: 35 minutes  Critical care time was exclusive of separately billable procedures and treating other patients.  Critical care was necessary to  treat or prevent imminent or life-threatening deterioration.  Critical care was time spent personally by me on the following activities: development of treatment plan with patient and/or surrogate as well as nursing, discussions with consultants, evaluation of patient's response to treatment, examination of patient, obtaining history from patient or surrogate, ordering and performing treatments and interventions, ordering and review of laboratory studies, ordering and review of radiographic studies, pulse oximetry and re-evaluation of patient's condition.   Procedures   MEDICATIONS ORDERED IN ED: Medications  lactated ringers bolus 1,000 mL (1,000 mLs Intravenous New Bag/Given 05/05/22 1558)     IMPRESSION / MDM / ASSESSMENT AND PLAN / ED COURSE  I reviewed the triage vital signs and the nursing notes.                              Differential diagnosis includes, but is not limited to, dehydration, infection, heat exposure.  Patient's presentation is most consistent with acute presentation with potential threat to life or bodily function.  Patient presented to the emergency department today accompanied by mother because of concerns for heat exposure.  Initial exam here was notable for significant tachycardia.  EKG did appear sinus. Patient was written for IV fluids given concern for dehydration. Work up with elevated WBC level, lactic acidosis.  Given concern for possible infection broad-spectrum antibiotics were ordered.  Additionally blood work showed significant elevation of creatinine over baseline.  I do think patient is quite dehydrated.  Patient's heart rate did improve after IV fluids.  Discussed concern for dehydration and infection with patient and family.  Discussed with Dr. Damita Dunnings with the hospitalist service who will plan on admission.  FINAL CLINICAL IMPRESSION(S) / ED DIAGNOSES   Final diagnoses:  AKI (acute kidney injury) ([place])  Lower urinary tract infectious disease   Dehydration     Note:  This document was prepared using Dragon voice recognition software and may include unintentional dictation errors.    Nance Pear, MD 05/05/22 [phone]

## 2022-05-05 NOTE — Assessment & Plan Note (Signed)
Secondary to all the above acute conditions. Treat the above and get PT eval

## 2022-05-05 NOTE — Assessment & Plan Note (Addendum)
Much improved

## 2022-05-05 NOTE — Assessment & Plan Note (Addendum)
Meets criteria BMI greater than 30 

## 2022-05-05 NOTE — Assessment & Plan Note (Addendum)
Patient air-conditioning quit working during heat wave.  Likely cause of rhabdomyolysis and confusion.  With normal procalcitonin and unimpressive urinalysis, sepsis ruled out.  Suspect lactic acid elevated secondary intravascular volume depletion.  Recheck labs.  We will stop antibiotics.

## 2022-05-05 NOTE — Assessment & Plan Note (Addendum)
Secondary to acute kidney injury from rhabdomyolysis from heat exhaustion.  Resolved.  Continue IV fluids.

## 2022-05-05 NOTE — Assessment & Plan Note (Addendum)
Continue IV fluids.  Follow CK levels.

## 2022-05-06 ENCOUNTER — Ambulatory Visit: Payer: Medicare Other | Admitting: Allergy

## 2022-05-06 ENCOUNTER — Inpatient Hospital Stay (HOSPITAL_COMMUNITY)
Admit: 2022-05-06 | Discharge: 2022-05-06 | Disposition: A | Discharge status: Home / Self Care | Payer: Medicare Other | Attending: Internal Medicine | Admitting: Internal Medicine

## 2022-05-06 DIAGNOSIS — I10 Essential (primary) hypertension: Secondary | ICD-10-CM | POA: Diagnosis not present

## 2022-05-06 DIAGNOSIS — N179 Acute kidney failure, unspecified: Secondary | ICD-10-CM | POA: Diagnosis not present

## 2022-05-06 DIAGNOSIS — E669 Obesity, unspecified: Secondary | ICD-10-CM | POA: Diagnosis not present

## 2022-05-06 DIAGNOSIS — I5031 Acute diastolic (congestive) heart failure: Secondary | ICD-10-CM

## 2022-05-06 DIAGNOSIS — G9341 Metabolic encephalopathy: Secondary | ICD-10-CM | POA: Diagnosis not present

## 2022-05-06 DIAGNOSIS — M6282 Rhabdomyolysis: Secondary | ICD-10-CM

## 2022-05-06 LAB — COMPREHENSIVE METABOLIC PANEL
ALT: 22 U/L (ref 0–44)
AST: 38 U/L (ref 15–41)
Albumin: 4.2 g/dL (ref 3.5–5.0)
Alkaline Phosphatase: 59 U/L (ref 38–126)
Anion gap: 8 (ref 5–15)
BUN: 25 mg/dL — ABNORMAL HIGH (ref 6–20)
CO2: 26 mmol/L (ref 22–32)
Calcium: 9.4 mg/dL (ref 8.9–10.3)
Chloride: 106 mmol/L (ref 98–111)
Creatinine, Ser: 1.71 mg/dL — ABNORMAL HIGH (ref 0.44–1.00)
GFR, Estimated: 36 mL/min — ABNORMAL LOW (ref >60)
Glucose, Bld: 130 mg/dL — ABNORMAL HIGH (ref 70–99)
Potassium: 3.2 mmol/L — ABNORMAL LOW (ref 3.5–5.1)
Sodium: 140 mmol/L (ref 135–145)
Total Bilirubin: 1.4 mg/dL — ABNORMAL HIGH (ref 0.3–1.2)
Total Protein: 7.7 g/dL (ref 6.5–8.1)

## 2022-05-06 LAB — PROCALCITONIN: Procalcitonin: <0.1 ng/mL

## 2022-05-06 LAB — LACTIC ACID, PLASMA: Lactic Acid, Venous: 1.1 mmol/L (ref 0.5–1.9)

## 2022-05-06 LAB — ECHOCARDIOGRAM COMPLETE
AR max vel: 3.16 cm2
AV Area VTI: 3.32 cm2
AV Area mean vel: 3.27 cm2
AV Mean grad: 3 mmHg
AV Peak grad: 6.3 mmHg
Ao pk vel: 1.25 m/s
Area-P 1/2: 3.37 cm2
Calc EF: 52.2 %
S' Lateral: 2.62 cm
Single Plane A2C EF: 53.2 %
Single Plane A4C EF: 49.4 %
Weight: 2720 oz

## 2022-05-06 LAB — TROPONIN I (HIGH SENSITIVITY): Troponin I (High Sensitivity): 46 ng/L — ABNORMAL HIGH (ref <18)

## 2022-05-06 LAB — HIV ANTIBODY (ROUTINE TESTING W REFLEX): HIV Screen 4th Generation wRfx: NONREACTIVE

## 2022-05-06 LAB — CBC
HCT: 40.4 % (ref 36.0–46.0)
Hemoglobin: 13.9 g/dL (ref 12.0–15.0)
MCH: 32.5 pg (ref 26.0–34.0)
MCHC: 34.4 g/dL (ref 30.0–36.0)
MCV: 94.4 fL (ref 80.0–100.0)
Platelets: 125 10*3/uL — ABNORMAL LOW (ref 150–400)
RBC: 4.28 MIL/uL (ref 3.87–5.11)
RDW: 13.3 % (ref 11.5–15.5)
WBC: 10.8 10*3/uL — ABNORMAL HIGH (ref 4.0–10.5)
nRBC: 0 % (ref 0.0–0.2)

## 2022-05-06 LAB — CK: Total CK: 701 U/L — ABNORMAL HIGH (ref 38–234)

## 2022-05-06 LAB — BRAIN NATRIURETIC PEPTIDE: B Natriuretic Peptide: 134.2 pg/mL — ABNORMAL HIGH (ref 0.0–100.0)

## 2022-05-06 MED ORDER — METOPROLOL TARTRATE 5 MG/5ML IV SOLN
5.0000 mg | Freq: Four times a day (QID) | INTRAVENOUS | Status: DC
  Administered 2022-05-06 – 2022-05-07 (×3): 5 mg via INTRAVENOUS
  Filled 2022-05-06 (×4): qty 5

## 2022-05-06 MED ORDER — SODIUM CHLORIDE 0.9 % IV SOLN: INTRAVENOUS | Status: DC

## 2022-05-06 MED ORDER — ENOXAPARIN SODIUM 40 MG/0.4ML IJ SOSY
40.0000 mg | PREFILLED_SYRINGE | INTRAMUSCULAR | Status: DC
  Administered 2022-05-06: 40 mg via SUBCUTANEOUS
  Filled 2022-05-06: qty 0.4

## 2022-05-06 MED ORDER — AMITRIPTYLINE HCL 50 MG PO TABS
50.0000 mg | ORAL_TABLET | Freq: Every day | ORAL | Status: DC
  Administered 2022-05-06 – 2022-05-07 (×2): 50 mg via ORAL
  Filled 2022-05-06 (×2): qty 1

## 2022-05-06 MED ORDER — POTASSIUM CHLORIDE CRYS ER 20 MEQ PO TBCR
40.0000 meq | EXTENDED_RELEASE_TABLET | Freq: Once | ORAL | Status: AC
Start: 2022-05-06 — End: 2022-05-06
  Administered 2022-05-06: 40 meq via ORAL
  Filled 2022-05-06: qty 2

## 2022-05-06 MED ORDER — ALUM & MAG HYDROXIDE-SIMETH 200-200-20 MG/5ML PO SUSP
30.0000 mL | Freq: Four times a day (QID) | ORAL | Status: DC | PRN
Start: 2022-05-06 — End: 2022-05-07
  Administered 2022-05-06: 30 mL via ORAL
  Filled 2022-05-06: qty 30

## 2022-05-06 MED ORDER — AMLODIPINE BESYLATE 5 MG PO TABS
5.0000 mg | ORAL_TABLET | Freq: Every day | ORAL | Status: DC
  Administered 2022-05-06 – 2022-05-07 (×2): 5 mg via ORAL
  Filled 2022-05-06 (×2): qty 1

## 2022-05-06 MED ORDER — HYDRALAZINE HCL 20 MG/ML IJ SOLN
10.0000 mg | Freq: Four times a day (QID) | INTRAMUSCULAR | Status: DC | PRN
  Administered 2022-05-06: 10 mg via INTRAVENOUS
  Filled 2022-05-06 (×2): qty 1

## 2022-05-06 NOTE — Progress Notes (Signed)
[facility] Progress Note  Patient: Dana Greer    ZOX:[id number]  DOA: 05/05/2022    Date of Service: the patient was seen and examined on 05/06/2022  Brief hospital course: 51 year old female with past medical history of obesity and hypertension who presented to the emergency room with several days of weakness and lethargy after patient's air conditioning stopped working.  Patient brought in after mother noted confusion.  Work-up revealed rhabdomyolysis and initially thought to be sepsis from UTI, however sepsis ruled out.  Started on IV fluids and antibiotics and admitted to hospitalist service.  By 7/7, creatinine improved.  Procalcitonin level normal.  Patient tired, but feeling better and more alert.  Assessment and Plan: Assessment and Plan: Acute metabolic encephalopathy-resolved as of 05/06/2022 Secondary to acute kidney injury from rhabdomyolysis from heat exhaustion.  Resolved.  Continue IV fluids.  Rhabdomyolysis Continue IV fluids.  Follow CK levels.  AKI (acute kidney injury) ([place]) No previous history of renal disease.  With IV fluids, creatinine down to 1.71.  Secondary to rhabdomyolysis.  Continue IV fluids.  Heat exhaustion Patient air-conditioning quit working during heat wave.  Likely cause of rhabdomyolysis and confusion.  With normal procalcitonin and unimpressive urinalysis, sepsis ruled out.  Suspect lactic acid elevated secondary intravascular volume depletion.  Recheck labs.  We will stop antibiotics.  Hypertension Patient supposed to be on Catapres patch, but has not been taking.  Blood pressures initially stable to low because of intravascular volume depletion, but following fluid resuscitation, blood pressure spiked to as high as 190s.  BNP and echocardiogram ordered to ensure no underlying CHF.  ADJ DISORDER WITH MIXED ANXIETY & DEPRESSED MOOD Please note the patient on Xanax 1 mg p.o. 4 times a day as needed.  Monitor for  withdrawals.  Diarrhea-resolved as of 05/06/2022 Much improved.  Obesity (BMI 30-39.9) Meets criteria BMI greater than 30  Generalized weakness Secondary to all the above acute conditions. Treat the above and get PT eval       Body mass index is 32.12 kg/m.        Consultants: None  Procedures: Echocardiogram  Antimicrobials: IV Rocephin 7/6 - 7/7  Code Status: Full code   Subjective: Patient fatigued, but denies any complaints  Objective: Elevated blood pressures Vitals:   05/06/22 1300 05/06/22 1400  BP: (!) 151/117 (!) 112/99  Pulse: 69 92  Resp:    Temp:    SpO2: 97% 97%    Intake/Output Summary (Last 24 hours) at 05/06/2022 1602 Last data filed at 05/06/2022 1000 Gross per 24 hour  Intake 1303.37 ml  Output --  Net 1303.37 ml   Filed Weights   05/05/22 1534  Weight: 77.1 kg   Body mass index is 32.12 kg/m.  Exam:  General: Alert and oriented x3, no acute distress HEENT: Normocephalic atraumatic, mucous membranes slightly dry Cardiovascular: Regular rate and rhythm, S1-S2 Respiratory: Clear to auscultation bilaterally Abdomen: Soft, nontender, nondistended with positive bowel sounds Musculoskeletal: No clubbing or cyanosis or edema Skin: No skin breaks or tears or lesions Psychiatry: Appropriate, no evidence of psychoses Neurology: No focal deficits  Data Reviewed: Improving creatinine.  Normal procalcitonin.  Follow-up lactic acid, CBC and CK level pending  Disposition:  Status is: Inpatient Remains inpatient appropriate because: Stabilization of renal function    Anticipated discharge date: 7/8 or 7/9  Remaining issues to be resolved so that patient can be discharged:  Normalization of renal function Follow-up on echocardiogram   Family Communication: Left message for family  DVT Prophylaxis: enoxaparin (LOVENOX) injection 40 mg Start: 05/06/22 2200    Author: Annita Brod ,MD 05/06/2022 4:02 PM  To reach On-call, see  care teams to locate the attending and reach out via [email or web address]. Between 7PM-7AM, please contact night-coverage If you still have difficulty reaching the attending provider, please page the [facility] (Director on Call) for [facility] on amion for assistance.

## 2022-05-06 NOTE — Assessment & Plan Note (Signed)
Please note the patient on Xanax 1 mg p.o. 4 times a day as needed.  Monitor for withdrawals.

## 2022-05-06 NOTE — Assessment & Plan Note (Signed)
Patient supposed to be on Catapres patch, but has not been taking.  Blood pressures initially stable to low because of intravascular volume depletion, but following fluid resuscitation, blood pressure spiked to as high as 190s.  BNP and echocardiogram ordered to ensure no underlying CHF.

## 2022-05-06 NOTE — Hospital Course (Addendum)
51 year old female with past medical history of obesity and hypertension who presented to the emergency room with several days of weakness and lethargy after patient's air conditioning stopped working.  Patient brought in after mother noted confusion.  Work-up revealed rhabdomyolysis and initially thought to be sepsis from UTI, however sepsis ruled out.  Started on IV fluids and antibiotics and admitted to hospitalist service.  By 7/7, creatinine improved.  Procalcitonin level normal.  Patient tired, but feeling better and more alert.

## 2022-05-06 NOTE — ED Notes (Signed)
This Probation officer reached out to [employer] Provider, Sharion Settler concerning BP. Provider states to hold PRN hydralazine and to continue administering fluids to pt.

## 2022-05-06 NOTE — Progress Notes (Signed)
*  PRELIMINARY RESULTS* Echocardiogram 2D Echocardiogram has been performed.  Dana Greer Gaetana Kawahara 05/06/2022, 2:47 PM

## 2022-05-07 DIAGNOSIS — M6282 Rhabdomyolysis: Secondary | ICD-10-CM | POA: Diagnosis not present

## 2022-05-07 DIAGNOSIS — T675XXA Heat exhaustion, unspecified, initial encounter: Secondary | ICD-10-CM

## 2022-05-07 DIAGNOSIS — G9341 Metabolic encephalopathy: Secondary | ICD-10-CM | POA: Diagnosis not present

## 2022-05-07 DIAGNOSIS — N179 Acute kidney failure, unspecified: Secondary | ICD-10-CM | POA: Diagnosis not present

## 2022-05-07 LAB — BASIC METABOLIC PANEL
Anion gap: 8 (ref 5–15)
BUN: 19 mg/dL (ref 6–20)
CO2: 23 mmol/L (ref 22–32)
Calcium: 8.6 mg/dL — ABNORMAL LOW (ref 8.9–10.3)
Chloride: 106 mmol/L (ref 98–111)
Creatinine, Ser: 1.46 mg/dL — ABNORMAL HIGH (ref 0.44–1.00)
GFR, Estimated: 43 mL/min — ABNORMAL LOW (ref >60)
Glucose, Bld: 93 mg/dL (ref 70–99)
Potassium: 3.2 mmol/L — ABNORMAL LOW (ref 3.5–5.1)
Sodium: 137 mmol/L (ref 135–145)

## 2022-05-07 LAB — CBC
HCT: 39.5 % (ref 36.0–46.0)
Hemoglobin: 13.3 g/dL (ref 12.0–15.0)
MCH: 31.8 pg (ref 26.0–34.0)
MCHC: 33.7 g/dL (ref 30.0–36.0)
MCV: 94.5 fL (ref 80.0–100.0)
Platelets: 113 10*3/uL — ABNORMAL LOW (ref 150–400)
RBC: 4.18 MIL/uL (ref 3.87–5.11)
RDW: 13.4 % (ref 11.5–15.5)
WBC: 9.2 10*3/uL (ref 4.0–10.5)
nRBC: 0 % (ref 0.0–0.2)

## 2022-05-07 LAB — MAGNESIUM: Magnesium: 2.1 mg/dL (ref 1.7–2.4)

## 2022-05-07 LAB — CK: Total CK: 610 U/L — ABNORMAL HIGH (ref 38–234)

## 2022-05-07 MED ORDER — POTASSIUM CHLORIDE 10 MEQ/100ML IV SOLN
10.0000 meq | INTRAVENOUS | Status: AC
  Filled 2022-05-07 (×2): qty 100

## 2022-05-07 MED ORDER — POTASSIUM CHLORIDE CRYS ER 20 MEQ PO TBCR
40.0000 meq | EXTENDED_RELEASE_TABLET | Freq: Once | ORAL | Status: AC
Start: 2022-05-07 — End: 2022-05-07
  Administered 2022-05-07: 40 meq via ORAL
  Filled 2022-05-07: qty 2

## 2022-05-07 MED ORDER — POTASSIUM CHLORIDE CRYS ER 20 MEQ PO TBCR: 40.0000 meq | EXTENDED_RELEASE_TABLET | ORAL | Status: DC

## 2022-05-07 MED ORDER — PANTOPRAZOLE SODIUM 40 MG PO TBEC: 40.0000 mg | DELAYED_RELEASE_TABLET | Freq: Every day | ORAL | 0 refills | Status: DC

## 2022-05-07 NOTE — Discharge Summary (Addendum)
Physician Discharge Summary   Patient: Dana Greer MRN: [id number] DOB: October 19, 1971  Admit date:     05/05/2022  Discharge date: 05/07/22  Discharge Physician: Sharen Hones   PCP: Abner Greenspan, MD   Recommendations at discharge:    Follow up with PCP in 1 week  Discharge Diagnoses: Active Problems:   AKI (acute kidney injury) ([place])   Rhabdomyolysis   Heat exhaustion   Hypertension   ADJ DISORDER WITH MIXED ANXIETY & DEPRESSED MOOD   Obesity (BMI 30-39.9)   B12 deficiency   Generalized weakness  Resolved Problems:   Acute metabolic encephalopathy   Diarrhea  Hospital Course: 51 year old female with past medical history of obesity and hypertension who presented to the emergency room with several days of weakness and lethargy after patient's air conditioning stopped working.  Patient brought in after mother noted confusion.  Work-up revealed rhabdomyolysis and initially thought to be sepsis from UTI, however sepsis ruled out.  Started on IV fluids and antibiotics and admitted to hospitalist service.  By 7/7, creatinine improved.  Procalcitonin level normal.  Patient tired, but feeling better and more alert.  Assessment and Plan: Heat exhaustion. Metabolic encephalopathy secondary to heat exhaustion. Rhabdomyolysis secondary to heat exhaustion. Acute kidney injury secondary to dehydration. Patient received IV fluids, condition much better today.  Renal function improving, CK level had dropped down.  She is tolerating diet, she is medically stable to be discharged.  She will follow-up with PCP in 1 week. Currently, her condition has improved, she is medically stable to be discharged.  Hypokalemia. Patient will be given 20 mEq of KCl IV and 40 mEq oral before discharge  Dysphagia. Patient has been complaining dysphagia for the last 3 days, she feels like food stuck in the middle of the chest.  She had a history of acid reflux, she had a GI physician whom she had seen  before.  She is advised to follow-up with her GI physician.  I also added Protonix.  Essential hypertension. Blood pressure is no longer elevated, hold off any additional blood pressure medicine, follow-up with PCP as outpatient  Obesity with BMI of 32.12.      Consultants: None Procedures performed: None  Disposition: Home Diet recommendation:  Discharge Diet Orders (From admission, onward)     Start     Ordered   05/07/22 0000  Diet - low sodium heart healthy        05/07/22 1038           Cardiac diet DISCHARGE MEDICATION: Allergies as of 05/07/2022       Reactions   Trazodone And Nefazodone Nausea And Vomiting   Amoxicillin Other (See Comments)   Yeast infection    Trazodone Nausea And Vomiting   Carafate [sucralfate] Nausea And Vomiting        Medication List     STOP taking these medications    cloNIDine 0.2 MG tablet Commonly known as: CATAPRES   Gemtesa 75 MG Tabs Generic drug: Vibegron   meloxicam 7.5 MG tablet Commonly known as: MOBIC   Pulmicort Flexhaler 90 MCG/ACT inhaler Generic drug: Budesonide   scopolamine 1 MG/3DAYS Commonly known as: Transderm-Scop (1.5 MG)   topiramate 100 MG tablet Commonly known as: TOPAMAX   UBRELVY PO       TAKE these medications    Aimovig 140 MG/ML Soaj Generic drug: Erenumab-aooe Inject into the skin.   ALPRAZolam 1 MG tablet Commonly known as: XANAX Take 1 mg by mouth 4 (four)  times daily as needed for anxiety or sleep.   amitriptyline 25 MG tablet Commonly known as: ELAVIL Take 2 tablets (50 mg total) by mouth daily.   cyclobenzaprine 10 MG tablet Commonly known as: FLEXERIL Take 1 tablet (10 mg total) by mouth at bedtime as needed for muscle spasms.   EMGALITY [place] Inject into the skin.   EPINEPHrine 0.3 mg/0.3 mL Soaj injection Commonly known as: EpiPen 2-Pak Inject 0.3 mg into the muscle as needed for anaphylaxis.   etonogestrel 68 MG Impl implant Commonly known as:  NEXPLANON Inject 1 each into the skin continuous. Reported on 01/27/2016   famotidine 20 MG tablet Commonly known as: PEPCID TAKE 1 TABLET BY MOUTH 2 TIMES DAILY   meclizine 25 MG tablet Commonly known as: ANTIVERT Take 50 mg by mouth as needed for dizziness.   ondansetron 8 MG tablet Commonly known as: ZOFRAN Take 8 mg by mouth 3 (three) times daily.   promethazine 25 MG tablet Commonly known as: PHENERGAN TAKE 1 TABLET BY MOUTH EVERY 8 HOURS AS NEEDED FOR NAUSEA AND VOMITING   promethazine 25 MG suppository Commonly known as: PHENERGAN Place 1 suppository (25 mg total) rectally every 6 (six) hours as needed for nausea or vomiting.   valACYclovir 500 MG tablet Commonly known as: VALTREX TAKE 1 TABLET BY MOUTH ONCE DAILY        Follow-up Information     [name], Marne A, MD Follow up in 1 week(s).   Specialties: Family Medicine, Radiology Contact information: [place] [address] [phone]                Discharge Exam: Danley Danker Weights   05/05/22 1534  Weight: 77.1 kg   General exam: Appears calm and comfortable  Respiratory system: Clear to auscultation. Respiratory effort normal. Cardiovascular system: S1 & S2 heard, RRR. No JVD, murmurs, rubs, gallops or clicks. No pedal edema. Gastrointestinal system: Abdomen is nondistended, soft and nontender. No organomegaly or masses felt. Normal bowel sounds heard. Central nervous system: Alert and oriented. No focal neurological deficits. Extremities: Symmetric 5 x 5 power. Skin: No rashes, lesions or ulcers Psychiatry: Judgement and insight appear normal. Mood & affect appropriate.  '  Condition at discharge: good  The results of significant diagnostics from this hospitalization (including imaging, microbiology, ancillary and laboratory) are listed below for reference.   Imaging Studies: ECHOCARDIOGRAM COMPLETE  Result Date: 05/06/2022    ECHOCARDIOGRAM REPORT   Patient Name:    Dana Greer Date of Exam: 05/06/2022 Medical Rec #:  [id number]            Height:       61.0 in Accession #:    [id number]           Weight:       170.0 lb Date of Birth:  [birth year]           BSA:          1.763 m Patient Age:    51 years             BP:           192/127 mmHg Patient Gender: F                    HR:           90 bpm. Exam Location:  [facility] Procedure: 2D Echo, Color Doppler and Cardiac Doppler Indications:     I50.31 congestive heart failure-Acute  Diastolic  History:         Patient has no prior history of Echocardiogram examinations.                  Stroke; Risk Factors:Hypertension.  Sonographer:     Charmayne Sheer Referring Phys:  [id number] SENDIL Wynelle Link Diagnosing Phys: Ida Rogue MD  Sonographer Comments: Suboptimal subcostal window. IMPRESSIONS  1. Left ventricular ejection fraction, by estimation, is 60 to 65%. The left ventricle has normal function. The left ventricle has no regional wall motion abnormalities. Left ventricular diastolic parameters are consistent with Grade I diastolic dysfunction (impaired relaxation).  2. Right ventricular systolic function is normal. The right ventricular size is normal. Tricuspid regurgitation signal is inadequate for assessing PA pressure.  3. Left atrial size was mildly dilated.  4. The mitral valve is normal in structure. Mild mitral valve regurgitation. No evidence of mitral stenosis.  5. The aortic valve is normal in structure. Aortic valve regurgitation is not visualized. No aortic stenosis is present.  6. The inferior vena cava is normal in size with greater than 50% respiratory variability, suggesting right atrial pressure of 3 mmHg. FINDINGS  Left Ventricle: Left ventricular ejection fraction, by estimation, is 60 to 65%. The left ventricle has normal function. The left ventricle has no regional wall motion abnormalities. The left ventricular internal cavity size was normal in size. There is  no left ventricular hypertrophy. Left  ventricular diastolic parameters are consistent with Grade I diastolic dysfunction (impaired relaxation). Right Ventricle: The right ventricular size is normal. No increase in right ventricular wall thickness. Right ventricular systolic function is normal. Tricuspid regurgitation signal is inadequate for assessing PA pressure. Left Atrium: Left atrial size was mildly dilated. Right Atrium: Right atrial size was normal in size. Pericardium: There is no evidence of pericardial effusion. Mitral Valve: The mitral valve is normal in structure. Mild mitral valve regurgitation. No evidence of mitral valve stenosis. Tricuspid Valve: The tricuspid valve is normal in structure. Tricuspid valve regurgitation is not demonstrated. No evidence of tricuspid stenosis. Aortic Valve: The aortic valve is normal in structure. Aortic valve regurgitation is not visualized. No aortic stenosis is present. Aortic valve mean gradient measures 3.0 mmHg. Aortic valve peak gradient measures 6.2 mmHg. Aortic valve area, by VTI measures 3.32 cm. Pulmonic Valve: The pulmonic valve was normal in structure. Pulmonic valve regurgitation is not visualized. No evidence of pulmonic stenosis. Aorta: The aortic root is normal in size and structure. Venous: The inferior vena cava is normal in size with greater than 50% respiratory variability, suggesting right atrial pressure of 3 mmHg. IAS/Shunts: No atrial level shunt detected by color flow Doppler.  LEFT VENTRICLE PLAX 2D LVIDd:         3.33 cm     Diastology LVIDs:         2.62 cm     LV e' medial:    6.42 cm/s LV PW:         1.16 cm     LV E/e' medial:  8.9 LV IVS:        0.87 cm     LV e' lateral:   8.81 cm/s LVOT diam:     2.10 cm     LV E/e' lateral: 6.5 LV SV:         65 LV SV Index:   37 LVOT Area:     3.46 cm  LV Volumes (MOD) LV vol d, MOD A2C: 69.9 ml LV vol d, MOD A4C:  78.0 ml LV vol s, MOD A2C: 32.7 ml LV vol s, MOD A4C: 39.5 ml LV SV MOD A2C:     37.2 ml LV SV MOD A4C:     78.0 ml LV SV  MOD BP:      39.5 ml RIGHT VENTRICLE RV Basal diam:  2.62 cm LEFT ATRIUM             Index        RIGHT ATRIUM           Index LA diam:        3.40 cm 1.93 cm/m   RA Area:     11.10 cm LA Vol (A2C):   22.5 ml 12.76 ml/m  RA Volume:   23.30 ml  13.22 ml/m LA Vol (A4C):   30.7 ml 17.42 ml/m LA Biplane Vol: 27.7 ml 15.71 ml/m  AORTIC VALVE                    PULMONIC VALVE AV Area (Vmax):    3.16 cm     PV Vmax:       1.31 m/s AV Area (Vmean):   3.27 cm     PV Peak grad:  6.9 mmHg AV Area (VTI):     3.32 cm AV Vmax:           125.00 cm/s AV Vmean:          86.800 cm/s AV VTI:            0.196 m AV Peak Grad:      6.2 mmHg AV Mean Grad:      3.0 mmHg LVOT Vmax:         114.00 cm/s LVOT Vmean:        81.900 cm/s LVOT VTI:          0.188 m LVOT/AV VTI ratio: 0.96  AORTA Ao Root diam: 3.10 cm MITRAL VALVE MV Area (PHT): 3.37 cm    SHUNTS MV Decel Time: 225 msec    Systemic VTI:  0.19 m MV E velocity: 57.40 cm/s  Systemic Diam: 2.10 cm MV A velocity: 77.10 cm/s MV E/A ratio:  0.74 Ida Rogue MD Electronically signed by Ida Rogue MD Signature Date/Time: 05/06/2022/5:09:49 PM    Final    CT HEAD WO CONTRAST (5MM)  Result Date: 05/05/2022 CLINICAL DATA:  Mental status change. Patient with weakness, diarrhea has IBS. EXAM: CT HEAD WITHOUT CONTRAST TECHNIQUE: Contiguous axial images were obtained from the base of the skull through the vertex without intravenous contrast. RADIATION DOSE REDUCTION: This exam was performed according to the departmental dose-optimization program which includes automated exposure control, adjustment of the mA and/or kV according to patient size and/or use of iterative reconstruction technique. COMPARISON:  CT head dated January 28, 2019 FINDINGS: Brain: No evidence of acute infarction, hemorrhage, hydrocephalus, extra-axial collection or mass lesion/mass effect. Prominent dural calcifications, unchanged. Chronic partially empty sella. Vascular: No hyperdense vessel or unexpected  calcification. Skull: Normal. Negative for fracture or focal lesion. Sinuses/Orbits: No acute finding. Other: None. IMPRESSION: No acute intracranial abnormality. Electronically Signed   By: Keane Police D.O.   On: 05/05/2022 23:36   DG Chest Portable 1 View  Result Date: 05/05/2022 CLINICAL DATA:  Chest pain.  Weakness. EXAM: PORTABLE CHEST 1 VIEW COMPARISON:  Radiograph 05/15/2020 FINDINGS: The heart is normal in size.The cardiomediastinal contours are normal. The lungs are clear. Pulmonary vasculature is normal. No consolidation, pleural effusion, or pneumothorax. Moderate scoliotic curvature of  the thoracic spine. No acute osseous abnormalities are seen. IMPRESSION: No acute chest findings. Electronically Signed   By: Keith Rake M.D.   On: 05/05/2022 17:41    Microbiology: Results for orders placed or performed during the hospital encounter of 05/05/22  Blood culture (routine x 2)     Status: None (Preliminary result)   Collection Time: 05/05/22  4:33 PM   Specimen: BLOOD  Result Value Ref Range Status   Specimen Description BLOOD RIGHT ANTECUBITAL  Final   Special Requests   Final    BOTTLES DRAWN AEROBIC AND ANAEROBIC Blood Culture adequate volume   Culture   Final    NO GROWTH 2 DAYS Performed at [facility], [address] [place], [place] [address]    Report Status PENDING  Incomplete  Blood culture (routine x 2)     Status: None (Preliminary result)   Collection Time: 05/05/22  4:58 PM   Specimen: BLOOD  Result Value Ref Range Status   Specimen Description BLOOD BLRA  Final   Special Requests BOTTLES DRAWN AEROBIC AND ANAEROBIC [place]  Final   Culture   Final    NO GROWTH 2 DAYS Performed at [facility], [address], [place], [place] [address]    Report Status PENDING  Incomplete  SARS Coronavirus 2 by RT PCR (hospital order, performed in [place] hospital lab) *cepheid single result test* Anterior Nasal Swab     Status: None   Collection  Time: 05/05/22  4:58 PM   Specimen: Anterior Nasal Swab  Result Value Ref Range Status   SARS Coronavirus 2 by RT PCR NEGATIVE NEGATIVE Final    Comment: (NOTE) SARS-CoV-2 target nucleic acids are NOT DETECTED.  The SARS-CoV-2 RNA is generally detectable in upper and lower respiratory specimens during the acute phase of infection. The lowest concentration of SARS-CoV-2 viral copies this assay can detect is 250 copies / mL. A negative result does not preclude SARS-CoV-2 infection and should not be used as the sole basis for treatment or other patient management decisions.  A negative result may occur with improper specimen collection / handling, submission of specimen other than nasopharyngeal swab, presence of viral mutation(s) within the areas targeted by this assay, and inadequate number of viral copies (<250 copies / mL). A negative result must be combined with clinical observations, patient history, and epidemiological information.  Fact Sheet for Patients:   https://www.patel.info/  Fact Sheet for Healthcare Providers: https://hall.com/  This test is not yet approved or  cleared by the [place] FDA and has been authorized for detection and/or diagnosis of SARS-CoV-2 by FDA under an Emergency Use Authorization (EUA).  This EUA will remain in effect (meaning this test can be used) for the duration of the COVID-19 declaration under Section 564(b)(1) of the Act, 21 U.S.C. section 360bbb-3(b)(1), unless the authorization is terminated or revoked sooner.  Performed at [facility], [address] [place], [place] [address]     Labs: CBC: Recent Labs  Lab 05/05/22 1554 05/06/22 2245 05/07/22 0532  WBC 18.6* 10.8* 9.2  NEUTROABS 15.4*  --   --   HGB 17.8* 13.9 13.3  HCT 51.9* 40.4 39.5  MCV 92.7 94.4 94.5  PLT 188 125* 947*   Basic Metabolic Panel: Recent Labs  Lab 05/05/22 1554 05/06/22 0449 05/07/22 0810   NA 139 140 137  K 3.6 3.2* 3.2*  CL 101 106 106  CO2 21* 26 23  GLUCOSE 148* 130* 93  BUN 31*  25* 19  CREATININE 2.73* 1.71* 1.46*  CALCIUM 10.2 9.4 8.6*  MG  --   --  2.1   Liver Function Tests: Recent Labs  Lab 05/05/22 1554 05/06/22 0449  AST 49* 38  ALT 24 22  ALKPHOS 73 59  BILITOT 1.5* 1.4*  PROT 9.4* 7.7  ALBUMIN 5.1* 4.2   CBG: No results for input(s): "GLUCAP" in the last 168 hours.  Discharge time spent: greater than 30 minutes.  Signed: Sharen Hones, MD [facility] 05/07/2022

## 2022-05-07 NOTE — Progress Notes (Signed)
Pt discharged to home, transported by friend.  AVS/discharge instructions reviewed with pt.  She acknowledged understanding and denies any questions.

## 2022-05-09 ENCOUNTER — Telehealth: Payer: Self-pay

## 2022-05-09 NOTE — Telephone Encounter (Signed)
The 17th is fine with me unless her symptoms change Thanks for letting me know  Dc summary reviewed

## 2022-05-09 NOTE — Telephone Encounter (Signed)
No openings until the 17th. Okay for patient to wait or okay to work in sooner for an appointment.

## 2022-05-09 NOTE — Telephone Encounter (Signed)
Pt called to report she was contacted by "[facility]" and they said she should have a sooner apt with PCP. Advised to contact front office at [facility] to see if they can see a sooner opening or ask Dr. Glori Bickers if it is ok for her to wait until 7/17 when she has an apt already scheduled. Pt verbalized understanding.

## 2022-05-09 NOTE — Telephone Encounter (Signed)
Patient notified as instructed by telephone and verbalized understanding. Patient stated that she is fine and okay to wait until 17th. Patient will call back if symptoms change.

## 2022-05-09 NOTE — Telephone Encounter (Signed)
Transition Care Management Follow-up Telephone Call Date of discharge and from where: 05/07/22 from [facility] How have you been since you were released from the hospital? Doing well Any questions or concerns? No  Items Reviewed: Did the pt receive and understand the discharge instructions provided? Yes  Medications obtained and verified? Yes  Other? No  Any new allergies since your discharge? No  Dietary orders reviewed? No Do you have support at home? Yes  mother  Home Care and Equipment/Supplies: Were home health services ordered? no If so, what is the name of the agency?  N/a  Has the agency set up a time to come to the patient's home? not applicable Were any new equipment or medical supplies ordered?  No What is the name of the medical supply agency? N/a  Were you able to get the supplies/equipment? not applicable Do you have any questions related to the use of the equipment or supplies? No  Functional Questionnaire: (I = Independent and D = Dependent) ADLs: I  Bathing/Dressing- I  Meal Prep- I  Eating- I  Maintaining continence- I  Transferring/Ambulation- I  Managing Meds- I  Follow up appointments reviewed:  PCP Hospital f/u appt confirmed? Yes  Scheduled to see Dr. Glori Bickers on 05/16/22 @ 8:30. [place] [facility] f/u appt confirmed?  None scheduled Are transportation arrangements needed? No  If their condition worsens, is the pt aware to call PCP or go to the Emergency Dept.? Yes Was the patient provided with contact information for the PCP's office or ED? Yes Was to pt encouraged to call back with questions or concerns? Yes

## 2022-05-10 LAB — CULTURE, BLOOD (ROUTINE X 2)
Culture: NO GROWTH
Culture: NO GROWTH
Special Requests: ADEQUATE

## 2022-05-11 ENCOUNTER — Encounter: Payer: Self-pay | Admitting: Family Medicine

## 2022-05-16 ENCOUNTER — Ambulatory Visit (INDEPENDENT_AMBULATORY_CARE_PROVIDER_SITE_OTHER): Payer: Medicare Other | Admitting: Family Medicine

## 2022-05-16 ENCOUNTER — Encounter: Payer: Self-pay | Admitting: Family Medicine

## 2022-05-16 VITALS — BP 116/74 | HR 68 | Ht 61.0 in | Wt 195.0 lb

## 2022-05-16 DIAGNOSIS — T675XXA Heat exhaustion, unspecified, initial encounter: Secondary | ICD-10-CM | POA: Diagnosis not present

## 2022-05-16 DIAGNOSIS — R1310 Dysphagia, unspecified: Secondary | ICD-10-CM | POA: Insufficient documentation

## 2022-05-16 DIAGNOSIS — T675XXS Heat exhaustion, unspecified, sequela: Secondary | ICD-10-CM | POA: Diagnosis not present

## 2022-05-16 DIAGNOSIS — M6282 Rhabdomyolysis: Secondary | ICD-10-CM

## 2022-05-16 DIAGNOSIS — R1319 Other dysphagia: Secondary | ICD-10-CM | POA: Diagnosis not present

## 2022-05-16 DIAGNOSIS — I10 Essential (primary) hypertension: Secondary | ICD-10-CM | POA: Diagnosis not present

## 2022-05-16 LAB — CBC WITH DIFFERENTIAL/PLATELET
Basophils Absolute: 0 10*3/uL (ref 0.0–0.1)
Basophils Relative: 0.2 % (ref 0.0–3.0)
Eosinophils Absolute: 0.1 10*3/uL (ref 0.0–0.7)
Eosinophils Relative: 1.3 % (ref 0.0–5.0)
HCT: 34.9 % — ABNORMAL LOW (ref 36.0–46.0)
Hemoglobin: 11.9 g/dL — ABNORMAL LOW (ref 12.0–15.0)
Lymphocytes Relative: 30.3 % (ref 12.0–46.0)
Lymphs Abs: 1.9 10*3/uL (ref 0.7–4.0)
MCHC: 34 g/dL (ref 30.0–36.0)
MCV: 97.6 fl (ref 78.0–100.0)
Monocytes Absolute: 0.3 10*3/uL (ref 0.1–1.0)
Monocytes Relative: 4.7 % (ref 3.0–12.0)
Neutro Abs: 4 10*3/uL (ref 1.4–7.7)
Neutrophils Relative %: 63.5 % (ref 43.0–77.0)
Platelets: 202 10*3/uL (ref 150.0–400.0)
RBC: 3.57 Mil/uL — ABNORMAL LOW (ref 3.87–5.11)
RDW: 14.3 % (ref 11.5–15.5)
WBC: 6.4 10*3/uL (ref 4.0–10.5)

## 2022-05-16 LAB — COMPREHENSIVE METABOLIC PANEL
ALT: 10 U/L (ref 0–35)
AST: 11 U/L (ref 0–37)
Albumin: 3.6 g/dL (ref 3.5–5.2)
Alkaline Phosphatase: 36 U/L — ABNORMAL LOW (ref 39–117)
BUN: 10 mg/dL (ref 6–23)
CO2: 28 mEq/L (ref 19–32)
Calcium: 9.1 mg/dL (ref 8.4–10.5)
Chloride: 106 mEq/L (ref 96–112)
Creatinine, Ser: 1.12 mg/dL (ref 0.40–1.20)
GFR: 56.88 mL/min — ABNORMAL LOW (ref >60.00)
Glucose, Bld: 111 mg/dL — ABNORMAL HIGH (ref 70–99)
Potassium: 4.7 mEq/L (ref 3.5–5.1)
Sodium: 139 mEq/L (ref 135–145)
Total Bilirubin: 0.4 mg/dL (ref 0.2–1.2)
Total Protein: 5.5 g/dL — ABNORMAL LOW (ref 6.0–8.3)

## 2022-05-16 LAB — CK: Total CK: 78 U/L (ref 7–177)

## 2022-05-16 NOTE — Assessment & Plan Note (Signed)
Pt had this in setting of heat illness in the hospital  Is back to baseline now  Reviewed hospital records, lab results and studies in detail   Was given ppi but now back on H2 blocker -pepcid 20 mg bid

## 2022-05-16 NOTE — Progress Notes (Signed)
Subjective:    Patient ID: Dana Greer, female    DOB: 1971/04/10, 51 y.o.   MRN: [id number]  HPI Pt presents for hospital f/u for heat illness  Wt Readings from Last 3 Encounters:  05/16/22 195 lb (88.5 kg)  05/05/22 170 lb (77.1 kg)  02/03/22 204 lb (92.5 kg)   36.84 kg/m  She was hosp for heat exhaustion after air conditioner broke  As follows: Hospital Course: 51 year old female with past medical history of obesity and hypertension who presented to the emergency room with several days of weakness and lethargy after patient's air conditioning stopped working.  Patient brought in after mother noted confusion.  Work-up revealed rhabdomyolysis and initially thought to be sepsis from UTI, however sepsis ruled out.  Started on IV fluids and antibiotics and admitted to hospitalist service.   By 7/7, creatinine improved.  Procalcitonin level normal.  Patient tired, but feeling better and more alert.   Assessment and Plan: Heat exhaustion. Metabolic encephalopathy secondary to heat exhaustion. Rhabdomyolysis secondary to heat exhaustion. Acute kidney injury secondary to dehydration. Patient received IV fluids, condition much better today.  Renal function improving, CK level had dropped down.  She is tolerating diet, she is medically stable to be discharged.  She will follow-up with PCP in 1 week. Currently, her condition has improved, she is medically stable to be discharged.   Hypokalemia. Patient will be given 20 mEq of KCl IV and 40 mEq oral before discharge  Dysphagia. Patient has been complaining dysphagia for the last 3 days, she feels like food stuck in the middle of the chest.  She had a history of acid reflux, she had a GI physician whom she had seen before.  She is advised to follow-up with her GI physician.  I also added Protonix.   Essential hypertension. Blood pressure is no longer elevated, hold off any additional blood pressure medicine, follow-up with PCP  as outpatient   BP Readings from Last 3 Encounters:  05/16/22 116/74  05/07/22 (!) 133/95  03/23/22 133/84   Pulse Readings from Last 3 Encounters:  05/16/22 68  05/07/22 71  03/23/22 87   Lab Results  Component Value Date   CREATININE 1.46 (H) 05/07/2022   BUN 19 05/07/2022   NA 137 05/07/2022   K 3.2 (L) 05/07/2022   CL 106 05/07/2022   CO2 23 05/07/2022   Lab Results  Component Value Date   ALT 22 05/06/2022   AST 38 05/06/2022   ALKPHOS 59 05/06/2022   BILITOT 1.4 (H) 05/06/2022   GFR 43 at discharge   Lab Results  Component Value Date   CKTOTAL 610 (H) 05/07/2022   Lab Results  Component Value Date   WBC 9.2 05/07/2022   HGB 13.3 05/07/2022   HCT 39.5 05/07/2022   MCV 94.5 05/07/2022   PLT 113 (L) 05/07/2022   Trying to drink regular fluids  Has some coconut water and water with electrolytes   Patient Active Problem List   Diagnosis Date Noted   Rhabdomyolysis 05/05/2022   Heat exhaustion 05/05/2022   Generalized weakness 05/05/2022   Obesity (BMI 30-39.9)    Rash and nonspecific skin eruption 11/15/2021   Perioral dermatitis 11/02/2021   Mixed incontinence 08/16/2021   Elevated serum creatinine 08/12/2021   Urinary frequency 08/12/2021   Herpes zoster without complication 48/88/9169   Hand pain, left 10/14/2020   Swelling of hand joint, left 10/14/2020   TMJ (temporomandibular joint syndrome) 08/18/2020   Non-intractable cyclical vomiting 45/12/8880  Left-sided back pain 02/25/2020   Hx of adenomatous polyp of colon 02/14/2020   Seasonal allergic rhinitis 01/21/2020   Chronic cough 10/04/2019   Headache, post-traumatic, acute 01/28/2019   Head injury, closed 01/17/2019   Abnormal vaginal bleeding 07/06/2018   Neck pain 06/25/2018   Chronic migraine 04/12/2018   B12 deficiency 01/10/2018   Foot pain, bilateral 01/09/2018   Hand paresthesia 01/09/2018   Vitamin D deficiency 01/09/2018   Upper abdominal pain 11/22/2016   AKI (acute  kidney injury) ([place]) 11/22/2016   Chronic myofascial pain 10/26/2016   HSV-2 infection 08/17/2016   Adverse effects of medication 11/25/2015   Vertigo 10/08/2015   Carpal tunnel syndrome 03/16/2015   Gout 12/22/2014   Vertiginous migraine 02/03/2014   Intractable migraine 08/31/2012   Osteoarthritis 05/11/2012   Female bladder prolapse 05/11/2012   ADJ DISORDER WITH MIXED ANXIETY & DEPRESSED MOOD 06/30/2008   HYPERHIDROSIS 01/25/2008   BACK PAIN, LUMBAR, CHRONIC 10/16/2007   REACTION, ACUTE STRESS W/EMOTIONAL DSTURB 08/01/2007   Prediabetes 08/01/2007   Hypertension 08/01/2007   Goiter 07/11/2007   POLYCYSTIC OVARIAN DISEASE 05/24/2007   HIRSUTISM 05/24/2007   Arthropathy, multiple sites 05/24/2007   Past Medical History:  Diagnosis Date   Arthritis    Depression    Fibromyalgia    Headache(784.0)    Hx of adenomatous polyp of colon 02/14/2020   Hypertension    Immune deficiency disorder ([place])    Migraine headache    Shingles    Stroke ([place])    Thrombocytopenia ([place]) 10/01/2012   Past Surgical History:  Procedure Laterality Date   ABDOMINAL HYSTERECTOMY     CARPAL TUNNEL RELEASE Left 09/25/2018   Procedure: LEFT CARPAL TUNNEL RELEASE;  Surgeon: Daryll Brod, MD;  Location: [facility];  Service: Orthopedics;  Laterality: Left;   CESAREAN SECTION  2008   x1   PARTIAL HYSTERECTOMY  2012   due to fibroid   UPPER GASTROINTESTINAL ENDOSCOPY     WISDOM TOOTH EXTRACTION  1995   Social History   Tobacco Use   Smoking status: Never   Smokeless tobacco: Never  Vaping Use   Vaping Use: Never used  Substance Use Topics   Alcohol use: Yes    Comment: occasional   Drug use: Yes    Types: Marijuana    Comment: smokes small amount twice daily   Family History  Problem Relation Age of Onset   Diabetes Father    COPD Father    Healthy Mother    Breast cancer Paternal Aunt    Colon cancer Paternal Grandmother    Rectal cancer Paternal Grandmother    Lung  cancer Paternal Uncle    Lung cancer Paternal Aunt    Esophageal cancer Cousin    Stomach cancer Neg Hx    Allergies  Allergen Reactions   Trazodone And Nefazodone Nausea And Vomiting   Amoxicillin Other (See Comments)    Yeast infection    Trazodone Nausea And Vomiting   Carafate [Sucralfate] Nausea And Vomiting   Current Outpatient Medications on File Prior to Visit  Medication Sig Dispense Refill   AIMOVIG 140 MG/ML SOAJ Inject into the skin.     ALPRAZolam (XANAX) 1 MG tablet Take 1 mg by mouth 4 (four) times daily as needed for anxiety or sleep.      amitriptyline (ELAVIL) 25 MG tablet Take 2 tablets (50 mg total) by mouth daily. 60 tablet 0   cyclobenzaprine (FLEXERIL) 10 MG tablet Take 1 tablet (10 mg total) by  mouth at bedtime as needed for muscle spasms. 30 tablet 3   EPINEPHrine (EPIPEN 2-PAK) 0.3 mg/0.3 mL IJ SOAJ injection Inject 0.3 mg into the muscle as needed for anaphylaxis. 2 each 1   etonogestrel (NEXPLANON) 68 MG IMPL implant Inject 1 each into the skin continuous. Reported on 01/27/2016     famotidine (PEPCID) 20 MG tablet TAKE 1 TABLET BY MOUTH 2 TIMES DAILY 60 tablet 3   Galcanezumab-gnlm (EMGALITY [place]) Inject into the skin.     meclizine (ANTIVERT) 25 MG tablet Take 50 mg by mouth as needed for dizziness.     ondansetron (ZOFRAN) 8 MG tablet Take 8 mg by mouth 3 (three) times daily.     pantoprazole (PROTONIX) 40 MG tablet Take 1 tablet (40 mg total) by mouth daily. 30 tablet 0   promethazine (PHENERGAN) 25 MG suppository Place 1 suppository (25 mg total) rectally every 6 (six) hours as needed for nausea or vomiting. 20 each 0   promethazine (PHENERGAN) 25 MG tablet TAKE 1 TABLET BY MOUTH EVERY 8 HOURS AS NEEDED FOR NAUSEA AND VOMITING 30 tablet 5   valACYclovir (VALTREX) 500 MG tablet TAKE 1 TABLET BY MOUTH ONCE DAILY 30 tablet 5   No current facility-administered medications on file prior to visit.    Review of Systems  Constitutional:  Positive for fatigue.  Negative for activity change, appetite change, fever and unexpected weight change.  HENT:  Negative for congestion, ear pain, rhinorrhea, sinus pressure and sore throat.   Eyes:  Negative for pain, redness and visual disturbance.  Respiratory:  Negative for cough, shortness of breath and wheezing.   Cardiovascular:  Negative for chest pain and palpitations.  Gastrointestinal:  Negative for abdominal pain, blood in stool, constipation and diarrhea.  Endocrine: Negative for polydipsia and polyuria.  Genitourinary:  Negative for dysuria, frequency and urgency.  Musculoskeletal:  Negative for arthralgias, back pain and myalgias.  Skin:  Negative for pallor and rash.  Allergic/Immunologic: Negative for environmental allergies.  Neurological:  Negative for dizziness, syncope and headaches.  Hematological:  Negative for adenopathy. Does not bruise/bleed easily.  Psychiatric/Behavioral:  Negative for decreased concentration and dysphoric mood. The patient is not nervous/anxious.        Objective:   Physical Exam Constitutional:      General: She is not in acute distress.    Appearance: Normal appearance. She is well-developed. She is obese. She is not ill-appearing or diaphoretic.  HENT:     Head: Normocephalic and atraumatic.     Mouth/Throat:     Mouth: Mucous membranes are moist.  Eyes:     General: No scleral icterus.    Conjunctiva/sclera: Conjunctivae normal.     Pupils: Pupils are equal, round, and reactive to light.  Neck:     Thyroid: No thyromegaly.     Vascular: No carotid bruit or JVD.  Cardiovascular:     Rate and Rhythm: Normal rate and regular rhythm.     Heart sounds: Normal heart sounds.     No gallop.  Pulmonary:     Effort: Pulmonary effort is normal. No respiratory distress.     Breath sounds: Normal breath sounds. No wheezing or rales.  Abdominal:     General: There is no distension or abdominal bruit.     Palpations: Abdomen is soft.  Musculoskeletal:      Cervical back: Normal range of motion and neck supple.     Right lower leg: No edema.     Left lower leg:  No edema.  Lymphadenopathy:     Cervical: No cervical adenopathy.  Skin:    General: Skin is warm and dry.     Coloration: Skin is not pale.     Findings: No rash.  Neurological:     Mental Status: She is alert.     Coordination: Coordination normal.     Deep Tendon Reflexes: Reflexes are normal and symmetric. Reflexes normal.  Psychiatric:        Mood and Affect: Mood normal.           Assessment & Plan:   Problem List Items Addressed This Visit       Cardiovascular and Mediastinum   Hypertension    BP is better/no longer elevated   BP: 116/74  Enc good fluid intake and healthy balance diet  Exercise when feeling better      Relevant Orders   CBC with Differential/Platelet   Comprehensive metabolic panel     Digestive   Dysphagia    Pt had this in setting of heat illness in the hospital  Is back to baseline now  Reviewed hospital records, lab results and studies in detail   Was given ppi but now back on H2 blocker -pepcid 20 mg bid          Musculoskeletal and Integument   Rhabdomyolysis    Pt was hosp for this in setting of heat illness , with acute renal injury and encephalopathy  Clinically better  Reassuring exam Lab today      Relevant Orders   CK     Other   Heat exhaustion, unspecified, sequela - Primary    hosp for this with met encephalopathy and rhabdo/ AKI  Reviewed hospital records, lab results and studies in detail  Much clinical improvement and hydration is better  Getting energy back gradually and keeping cool  Reassuring exam Lab today  Update if not starting to improve in a week or if worsening

## 2022-05-16 NOTE — Patient Instructions (Signed)
Keep up good fluids Rest and when ready advance activity as tolerated Be very careful in the heat   Lab today   Please let us know if any symptoms return

## 2022-05-16 NOTE — Assessment & Plan Note (Signed)
hosp for this with met encephalopathy and rhabdo/ AKI  Reviewed hospital records, lab results and studies in detail  Much clinical improvement and hydration is better  Getting energy back gradually and keeping cool  Reassuring exam Lab today  Update if not starting to improve in a week or if worsening

## 2022-05-16 NOTE — Assessment & Plan Note (Signed)
Pt was hosp for this in setting of heat illness , with acute renal injury and encephalopathy  Clinically better  Reassuring exam Lab today

## 2022-05-16 NOTE — Assessment & Plan Note (Signed)
BP is better/no longer elevated   BP: 116/74  Enc good fluid intake and healthy balance diet  Exercise when feeling better

## 2022-05-18 ENCOUNTER — Ambulatory Visit (INDEPENDENT_AMBULATORY_CARE_PROVIDER_SITE_OTHER): Payer: Medicare Other | Admitting: Family Medicine

## 2022-05-18 ENCOUNTER — Encounter: Payer: Self-pay | Admitting: Family Medicine

## 2022-05-18 VITALS — BP 130/100 | HR 80 | Temp 98.5°F | Ht 61.0 in | Wt 195.1 lb

## 2022-05-18 DIAGNOSIS — M1712 Unilateral primary osteoarthritis, left knee: Secondary | ICD-10-CM | POA: Diagnosis not present

## 2022-05-18 DIAGNOSIS — M25462 Effusion, left knee: Secondary | ICD-10-CM

## 2022-05-18 MED ORDER — TRIAMCINOLONE ACETONIDE 40 MG/ML IJ SUSP
40.0000 mg | Freq: Once | INTRAMUSCULAR | Status: AC
  Administered 2022-05-18: 40 mg via INTRA_ARTICULAR

## 2022-05-18 NOTE — Progress Notes (Signed)
Dana Bitter T. Thersea Manfredonia, MD, [place] at [facility] [place], [address]  Phone: [phone]  FAX: [phone]  Dana Greer - 51 y.o. female  MRN [id number]  Date of Birth: [birth year]  Date: 05/18/2022  PCP: Abner Greenspan, MD  Referral: Abner Greenspan, MD  Chief Complaint  Patient presents with   Knee Pain    Left   Subjective:   Dana Greer is a 52 y.o. very pleasant female patient with Body mass index is 36.87 kg/m. who presents with the following:  She is here for evaluation of left-sided knee pain.  She recently was in the hospital for rhabdomyolysis and renal failure, and she during this event has minimal recollection of what occurred.  Her knee was already hurting before this, but afterwards this has been painful, she is limping, she had a very large effusion.  Currently she does still have an effusion, but it is better compared to where it was.  When she was in the hospital, she had some pain with moving her hand over her knees.  She still has some pain and has a mechanical pop.  She has had her knee gets stuck in 1 position where she had to manually manipulate it to relieve the pain.  Right now, it is doing better through the course of the week.  Review of Systems is noted in the HPI, as appropriate  Objective:   BP (!) 130/100   Pulse 80   Temp 98.5 F (36.9 C) (Oral)   Ht '5\' 1"'$  (1.549 m)   Wt 195 lb 2 oz (88.5 kg)   LMP 10/16/2019 (Within Days) Comment: had hysterectomy-still has half of her uterus and ovaries  SpO2 100%   BMI 36.87 kg/m   GEN: No acute distress; alert,appropriate. PULM: Breathing comfortably in no respiratory distress PSYCH: Normally interactive.   Left knee: She lacks 4 degrees of extension and flexion to 90 degrees.  Tenderness at the medial lateral patellar facet Large ballotable effusion Stable to varus and valgus stress.  Lachman is negative, drawer  testing is negative. She does have medial joint line tenderness, greatest in the posterior medial aspect of the knee.  She also has a lesser extent lateral joint line tenderness. Any form of forced flexion or extension causes pain.  Bounce home is negative as well as McMurray's is positive for pain without mechanical symptoms.  Laboratory and Imaging Data:  Assessment and Plan:     ICD-10-CM   1. Primary osteoarthritis of left knee  M17.12 triamcinolone acetonide (KENALOG-40) injection 40 mg    2. Effusion of left knee joint  M25.462 triamcinolone acetonide (KENALOG-40) injection 40 mg     Acute on chronic knee pain with exacerbation in setting of known osteoarthritis with exacerbation.  Other internal derangement is also entirely possible, but there is no clear mechanism.  Unclear what could have happened while she was disoriented from rhabdomyolysis.  Continue to ice, basic range of motion.  Avoid any NSAIDs right now.  She understands.  Tylenol for pain.  We will also going to aspirate and inject her knee for symptomatic relief and therapeutic.  Aspiration/Injection Procedure Note Dana Greer 12-18-1970 Date of procedure: 05/18/2022  Procedure: Large Joint Aspiration / Injection with synovial fluid aspiration of knee Indications: Pain  Procedure Details Patient verbally consented; risks, benefits, and alternatives explained. Patient prepped with Chloraprep. Ethyl chloride for anesthesia. 10 cc of 1% Lidocaine used in  wheal then injected Subcutaneous fashion with 22 gauge needle on lateral approach. Under sterilne conditions, 18 gauge needle used via lateral approach to aspirate 60 cc of clear, yellow fluid. Then 9 cc of Lidocaine 1% and 1 mL of Kenalog 40 mg injected. Tolerated well, decreased pain, no complications. Medication: 1 mL of Kenalog 40 mg   Medication Management during today's office visit: Meds ordered this encounter  Medications   triamcinolone acetonide  (KENALOG-40) injection 40 mg   There are no discontinued medications.  Orders placed today for conditions managed today: No orders of the defined types were placed in this encounter.   Follow-up if needed: No follow-ups on file.  [employer] One speech-to-text software was used for transcription in this dictation.  Possible transcriptional errors can occur using Editor, commissioning.   Signed,  Maud Deed. Calianna Kim, MD   Outpatient Encounter Medications as of 05/18/2022  Medication Sig   AIMOVIG 140 MG/ML SOAJ Inject into the skin.   ALPRAZolam (XANAX) 1 MG tablet Take 1 mg by mouth 4 (four) times daily as needed for anxiety or sleep.    amitriptyline (ELAVIL) 25 MG tablet Take 2 tablets (50 mg total) by mouth daily.   cyclobenzaprine (FLEXERIL) 10 MG tablet Take 1 tablet (10 mg total) by mouth at bedtime as needed for muscle spasms.   EPINEPHrine (EPIPEN 2-PAK) 0.3 mg/0.3 mL IJ SOAJ injection Inject 0.3 mg into the muscle as needed for anaphylaxis.   etonogestrel (NEXPLANON) 68 MG IMPL implant Inject 1 each into the skin continuous. Reported on 01/27/2016   famotidine (PEPCID) 20 MG tablet TAKE 1 TABLET BY MOUTH 2 TIMES DAILY   Galcanezumab-gnlm (EMGALITY [place]) Inject into the skin.   meclizine (ANTIVERT) 25 MG tablet Take 50 mg by mouth as needed for dizziness.   ondansetron (ZOFRAN) 8 MG tablet Take 8 mg by mouth 3 (three) times daily.   promethazine (PHENERGAN) 25 MG suppository Place 1 suppository (25 mg total) rectally every 6 (six) hours as needed for nausea or vomiting.   valACYclovir (VALTREX) 500 MG tablet TAKE 1 TABLET BY MOUTH ONCE DAILY   [EXPIRED] triamcinolone acetonide (KENALOG-40) injection 40 mg    No facility-administered encounter medications on file as of 05/18/2022.

## 2022-05-30 ENCOUNTER — Encounter: Payer: Self-pay | Admitting: Family Medicine

## 2022-05-30 NOTE — Telephone Encounter (Signed)
Noted. Thanks.  Routed to PCP as FYI.  

## 2022-05-30 NOTE — Telephone Encounter (Signed)
Spoke to patient's daughter by telephone and was advised that her mom is feeling some better and her blood pressure is now 145/110. Patient's daughter stated that this happens often with her mom. Patient's daughter stated that her mom has gone back to sleep. Patient's daughter was advised that her mom should go to the ER to be evaluated. Patient was advised that Dr. Glori Bickers is not in the office today. Patient's daughter was given information on [facility]. Patient's daughter stated that she will plan on taking her mom to the ER to be evaluated.

## 2022-06-02 ENCOUNTER — Emergency Department (HOSPITAL_COMMUNITY): Payer: Medicare Other

## 2022-06-02 ENCOUNTER — Emergency Department (HOSPITAL_COMMUNITY)
Admission: EM | Admit: 2022-06-02 | Discharge: 2022-06-02 | Disposition: A | Discharge status: Home / Self Care | Payer: Medicare Other | Attending: Emergency Medicine | Admitting: Emergency Medicine

## 2022-06-02 ENCOUNTER — Other Ambulatory Visit: Payer: Self-pay

## 2022-06-02 ENCOUNTER — Encounter (HOSPITAL_COMMUNITY): Payer: Self-pay | Admitting: Emergency Medicine

## 2022-06-02 DIAGNOSIS — Z79899 Other long term (current) drug therapy: Secondary | ICD-10-CM | POA: Insufficient documentation

## 2022-06-02 DIAGNOSIS — R519 Headache, unspecified: Secondary | ICD-10-CM | POA: Diagnosis present

## 2022-06-02 DIAGNOSIS — R110 Nausea: Secondary | ICD-10-CM

## 2022-06-02 DIAGNOSIS — R1084 Generalized abdominal pain: Secondary | ICD-10-CM | POA: Diagnosis not present

## 2022-06-02 DIAGNOSIS — G4489 Other headache syndrome: Secondary | ICD-10-CM | POA: Diagnosis not present

## 2022-06-02 DIAGNOSIS — R112 Nausea with vomiting, unspecified: Secondary | ICD-10-CM | POA: Diagnosis not present

## 2022-06-02 DIAGNOSIS — I10 Essential (primary) hypertension: Secondary | ICD-10-CM | POA: Insufficient documentation

## 2022-06-02 DIAGNOSIS — R4182 Altered mental status, unspecified: Secondary | ICD-10-CM | POA: Diagnosis not present

## 2022-06-02 DIAGNOSIS — R1111 Vomiting without nausea: Secondary | ICD-10-CM | POA: Diagnosis not present

## 2022-06-02 LAB — CBC WITH DIFFERENTIAL/PLATELET
Abs Immature Granulocytes: 0.04 10*3/uL (ref 0.00–0.07)
Basophils Absolute: 0 10*3/uL (ref 0.0–0.1)
Basophils Relative: 0 %
Eosinophils Absolute: 0 10*3/uL (ref 0.0–0.5)
Eosinophils Relative: 0 %
HCT: 41.5 % (ref 36.0–46.0)
Hemoglobin: 14.1 g/dL (ref 12.0–15.0)
Immature Granulocytes: 0 %
Lymphocytes Relative: 10 %
Lymphs Abs: 1 10*3/uL (ref 0.7–4.0)
MCH: 33.2 pg (ref 26.0–34.0)
MCHC: 34 g/dL (ref 30.0–36.0)
MCV: 97.6 fL (ref 80.0–100.0)
Monocytes Absolute: 0.4 10*3/uL (ref 0.1–1.0)
Monocytes Relative: 4 %
Neutro Abs: 8 10*3/uL — ABNORMAL HIGH (ref 1.7–7.7)
Neutrophils Relative %: 86 %
Platelets: 173 10*3/uL (ref 150–400)
RBC: 4.25 MIL/uL (ref 3.87–5.11)
RDW: 14.1 % (ref 11.5–15.5)
WBC: 9.4 10*3/uL (ref 4.0–10.5)
nRBC: 0 % (ref 0.0–0.2)

## 2022-06-02 LAB — PREGNANCY, URINE: Preg Test, Ur: NEGATIVE

## 2022-06-02 LAB — COMPREHENSIVE METABOLIC PANEL
ALT: 12 U/L (ref 0–44)
AST: 18 U/L (ref 15–41)
Albumin: 4.3 g/dL (ref 3.5–5.0)
Alkaline Phosphatase: 51 U/L (ref 38–126)
Anion gap: 12 (ref 5–15)
BUN: 14 mg/dL (ref 6–20)
CO2: 21 mmol/L — ABNORMAL LOW (ref 22–32)
Calcium: 9.6 mg/dL (ref 8.9–10.3)
Chloride: 107 mmol/L (ref 98–111)
Creatinine, Ser: 1.26 mg/dL — ABNORMAL HIGH (ref 0.44–1.00)
GFR, Estimated: 52 mL/min — ABNORMAL LOW (ref >60)
Glucose, Bld: 165 mg/dL — ABNORMAL HIGH (ref 70–99)
Potassium: 3.3 mmol/L — ABNORMAL LOW (ref 3.5–5.1)
Sodium: 140 mmol/L (ref 135–145)
Total Bilirubin: 1 mg/dL (ref 0.3–1.2)
Total Protein: 7.3 g/dL (ref 6.5–8.1)

## 2022-06-02 LAB — RAPID URINE DRUG SCREEN, HOSP PERFORMED
Amphetamines: NOT DETECTED
Barbiturates: NOT DETECTED
Benzodiazepines: POSITIVE — AB
Cocaine: NOT DETECTED
Opiates: NOT DETECTED
Tetrahydrocannabinol: POSITIVE — AB

## 2022-06-02 LAB — CK: Total CK: 117 U/L (ref 38–234)

## 2022-06-02 LAB — CBG MONITORING, ED: Glucose-Capillary: 144 mg/dL — ABNORMAL HIGH (ref 70–99)

## 2022-06-02 LAB — TROPONIN I (HIGH SENSITIVITY): Troponin I (High Sensitivity): 7 ng/L (ref <18)

## 2022-06-02 LAB — LIPASE, BLOOD: Lipase: 24 U/L (ref 11–51)

## 2022-06-02 MED ORDER — KETOROLAC TROMETHAMINE 15 MG/ML IJ SOLN
15.0000 mg | Freq: Once | INTRAMUSCULAR | Status: AC
  Administered 2022-06-02: 15 mg via INTRAVENOUS
  Filled 2022-06-02: qty 1

## 2022-06-02 MED ORDER — DIPHENHYDRAMINE HCL 25 MG PO CAPS
25.0000 mg | ORAL_CAPSULE | Freq: Once | ORAL | Status: DC
  Filled 2022-06-02: qty 1

## 2022-06-02 MED ORDER — PROCHLORPERAZINE EDISYLATE 10 MG/2ML IJ SOLN
5.0000 mg | Freq: Once | INTRAMUSCULAR | Status: AC
Start: 2022-06-02 — End: 2022-06-02
  Administered 2022-06-02: 5 mg via INTRAVENOUS
  Filled 2022-06-02: qty 2

## 2022-06-02 MED ORDER — DROPERIDOL 2.5 MG/ML IJ SOLN
2.5000 mg | Freq: Once | INTRAMUSCULAR | Status: AC
  Administered 2022-06-02: 2.5 mg via INTRAVENOUS
  Filled 2022-06-02: qty 2

## 2022-06-02 MED ORDER — ONDANSETRON HCL 4 MG/2ML IJ SOLN
4.0000 mg | Freq: Once | INTRAMUSCULAR | Status: AC
  Administered 2022-06-02: 4 mg via INTRAVENOUS
  Filled 2022-06-02: qty 2

## 2022-06-02 MED ORDER — ACETAMINOPHEN 500 MG PO TABS
1000.0000 mg | ORAL_TABLET | Freq: Four times a day (QID) | ORAL | Status: DC | PRN
  Filled 2022-06-02: qty 2

## 2022-06-02 MED ORDER — HYDRALAZINE HCL 20 MG/ML IJ SOLN
10.0000 mg | Freq: Once | INTRAMUSCULAR | Status: AC
  Administered 2022-06-02: 10 mg via INTRAVENOUS
  Filled 2022-06-02: qty 1

## 2022-06-02 NOTE — ED Triage Notes (Addendum)
Patient from home with complaints of hypertensive urgency. Patient w/ n/v, abdominal pain and headaches- started Monday. Patient monitors BP at home and noticed that her BP readings were elevated for about 3 days now.  BP-220/117  HR-66 CBG-150  SpO2-100%   Takes clonidine for BP and took her meds this AM.

## 2022-06-02 NOTE — ED Provider Notes (Signed)
[facility]ERGENCY DEPARTMENT Provider Note   CSN: [id number] Arrival date & time: 06/02/22  1021     History  Chief Complaint  Patient presents with   Hypertension    Dana Greer is a 51 y.o. female.  With past medical history of hypertension on clonidine, adjustment disorder, anxiety, depression, recent admission for rhabdomyolysis secondary to heat exhaustion and associated metabolic encephalopathy who presents to the emergency department with 2 to 3 days of headache, nausea, generalized abdominal pain and generalized unwellness. Patient presents to the emergency department with 3 days of progressive gradual onset headache, abdominal pain and nausea in the setting of hypertension recorded at home in the 200s.  Patient states that she has been compliant with her home hypertensive regimen including clonidine.  No increase or decrease in recent dose.  Patient states no associated fevers or other infectious illnesses recently.  No recent head trauma.  Patient's abdominal pain is described as "crampy" and is generalized.  She states that her " muscles tighten up and cramp every once in a while."  The location of pain moves with each cramp. No recent fevers or chills.   Hypertension Associated symptoms include abdominal pain and headaches. Pertinent negatives include no chest pain and no shortness of breath.       Home Medications Prior to Admission medications   Medication Sig Start Date End Date Taking? Authorizing Provider  AIMOVIG 140 MG/ML SOAJ Inject into the skin. 12/22/21   [provider]  ALPRAZolam Duanne Moron) 1 MG tablet Take 1 mg by mouth 4 (four) times daily as needed for anxiety or sleep.     [provider]  amitriptyline (ELAVIL) 25 MG tablet Take 2 tablets (50 mg total) by mouth daily. 05/18/20   Kayleen Memos, DO  cyclobenzaprine (FLEXERIL) 10 MG tablet Take 1 tablet (10 mg total) by mouth at bedtime as needed for muscle spasms.  10/05/21   [name], Wynelle Fanny, MD  EPINEPHrine (EPIPEN 2-PAK) 0.3 mg/0.3 mL IJ SOAJ injection Inject 0.3 mg into the muscle as needed for anaphylaxis. 12/30/21   Kennith Gain, MD  etonogestrel (NEXPLANON) 68 MG IMPL implant Inject 1 each into the skin continuous. Reported on 01/27/2016    [provider]  famotidine (PEPCID) 20 MG tablet TAKE 1 TABLET BY MOUTH 2 TIMES DAILY 01/25/22   [name], Wynelle Fanny, MD  Galcanezumab-gnlm [facility]) Inject into the skin.    [provider]  meclizine (ANTIVERT) 25 MG tablet Take 50 mg by mouth as needed for dizziness.    [provider]  ondansetron (ZOFRAN) 8 MG tablet Take 8 mg by mouth 3 (three) times daily. 06/24/21   [provider]  promethazine (PHENERGAN) 25 MG suppository Place 1 suppository (25 mg total) rectally every 6 (six) hours as needed for nausea or vomiting. 04/26/22   [name], Wynelle Fanny, MD  valACYclovir (VALTREX) 500 MG tablet TAKE 1 TABLET BY MOUTH ONCE DAILY 01/27/22   [name], Wynelle Fanny, MD      Allergies    Trazodone and nefazodone, Amoxicillin, Trazodone, and Carafate [sucralfate]    Review of Systems   Review of Systems  Constitutional:  Positive for fatigue. Negative for chills and fever.  HENT:  Negative for ear pain and sore throat.   Eyes:  Negative for pain and visual disturbance.  Respiratory:  Negative for cough and shortness of breath.   Cardiovascular:  Negative for chest pain and palpitations.  Gastrointestinal:  Positive for abdominal pain,  nausea and vomiting.  Genitourinary:  Negative for dysuria and hematuria.  Musculoskeletal:  Negative for arthralgias and back pain.  Skin:  Negative for color change and rash.  Neurological:  Positive for headaches. Negative for seizures and syncope.  All other systems reviewed and are negative.   Physical Exam Updated Vital Signs LMP 10/16/2019 (Within Days) Comment: had hysterectomy-still has half of her uterus and ovaries Physical Exam Vitals  and nursing note reviewed.  Constitutional:      General: She is not in acute distress.    Appearance: She is well-developed.     Comments: Upon entering the exam room where the patient is lying in bed eyes closed.  She is intermittently writhing in pain.  She appears to be in acute distress and uncomfortable.  She is somewhat pale appearing.  HENT:     Head: Normocephalic and atraumatic.     Comments: Normocephalic and atraumatic Eyes:     Conjunctiva/sclera: Conjunctivae normal.  Cardiovascular:     Rate and Rhythm: Normal rate and regular rhythm.     Heart sounds: No murmur heard.    Comments: Regular rate and rhythm no murmurs or gallops.  Sinus rhythm appreciated on the monitor. Pulmonary:     Effort: Pulmonary effort is normal. No respiratory distress.     Breath sounds: Normal breath sounds.     Comments: CTAB. speaking in full sentences without difficulty. Abdominal:     Palpations: Abdomen is soft.     Tenderness: There is no abdominal tenderness.     Comments: Soft nondistended nontender  Musculoskeletal:        General: No swelling.     Cervical back: Neck supple.  Skin:    General: Skin is warm and dry.     Capillary Refill: Capillary refill takes less than 2 seconds.  Neurological:     Cranial Nerves: Cranial nerves 2-12 are intact.     Sensory: Sensation is intact.     Motor: Motor function is intact. No weakness.     Gait: Gait is intact.     Comments: Patient is somewhat somnolent but easily arousable.  She is answering all orientation questions appropriately.  Gait visualized and is appropriate.  Question true bilateral dysdiadochokinesia versus effort dependence.  Heel-to-shin is appropriate.  Psychiatric:        Mood and Affect: Mood normal.     ED Results / Procedures / Treatments   Labs (all labs ordered are listed, but only abnormal results are displayed) Labs Reviewed - No data to display  EKG None  Radiology No results  found.  Procedures Procedures    Medications Ordered in ED Medications - No data to display  ED Course/ Medical Decision Making/ A&P                           Medical Decision Making Amount and/or Complexity of Data Reviewed Labs: ordered. Radiology: ordered and independent interpretation performed. Decision-making details documented in ED Course. ECG/medicine tests: ordered and independent interpretation performed. Decision-making details documented in ED Course.  Risk OTC drugs. Prescription drug management.   Patient presents to the emergency department hypertensive to the 967E systolic otherwise hemodynamically stable, slightly somnolent on exam and somewhat unwell appearing.  Differential diagnosis includes hypertensive encephalopathy versus posterior reversible encephalopathy syndrome versus less likely bleed in the absence of focal findings or other risk factors versus toxic metabolic encephalopathy.  Laboratory work-up initiated, CT head ordered.  Will provide  10 mg of intravenous hydralazine and reassess.  Patient provided with migraine cocktail analgesia.  I have personally reviewed and interpreted the patient's EKG which was remarkable for normal sinus rhythm without ST segment changes consistent with ischemia.  I personally reviewed the patient CT head which was grossly unremarkable for acute intracranial pathology such as bleed.  Patient still persistently experiencing severe nausea without emesis.  Will give 2.5 mg of intravenous droperidol and reassess.  Additional reassessment reveals that the patient is now resting comfortably and is well-appearing.  Her blood pressure is approximately 445 systolic.  I have personally reviewed interpret the patient's laboratory work-up which was only remarkable for THC positivity and benzodiazepine positivity on UDS. Patient counseled regarding potential THC induced hyperemesis syndrome.  Patient wishes to go home at this time.   Feel this is appropriate.  Patient was discharged home in stable condition with instructions to follow-up with her primary care physician in the next several days to discuss medication management moving forward regarding her hypertension.  Patient was given strict return precautions for confusion, inability to tolerate oral intake or suddenly worsening headache.             Final Clinical Impression(s) / ED Diagnoses Final diagnoses:  Hypertension, unspecified type  Acute intractable headache, unspecified headache type  Nausea    Rx / DC Orders ED Discharge Orders     None         Levie Heritage, MD 06/02/22 1317    Isla Pence, MD 06/02/22 1325

## 2022-06-02 NOTE — ED Notes (Signed)
Pt remains severely nauseated with periods of dry heaves. Discharge delayed until pt can pass PO challenge.

## 2022-06-02 NOTE — Discharge Instructions (Signed)
You were seen in the emergency room today for hyperlipidemia and for headache.  We did a CT scan of your head that did not show anything emergent.  Your blood work looked good.  We think you are safe to go home at this time.  We would like you to see your regular doctor in the next couple days to discuss your blood pressure medication regimen.  If your headache suddenly worsens or if you are unable to keep anything down by mouth please come back to the emergency department.

## 2022-06-02 NOTE — ED Notes (Signed)
Pt tolerating PO intake with minimal nausea and no episodes of vomiting. Pt instructed on clear liquid diet, followed by bland diet.   Pt teaching provided on medications that may cause drowsiness. Pt instructed not to drive or operate heavy machinery while taking the prescribed medication. Pt verbalized understanding.   Pt provided discharge instructions and prescription information. Pt was given the opportunity to ask questions and questions were answered.

## 2022-06-03 ENCOUNTER — Encounter: Payer: Self-pay | Admitting: Family Medicine

## 2022-06-03 ENCOUNTER — Ambulatory Visit (INDEPENDENT_AMBULATORY_CARE_PROVIDER_SITE_OTHER): Payer: Medicare Other | Admitting: Family Medicine

## 2022-06-03 VITALS — BP 188/102 | HR 154 | Ht 61.0 in | Wt 175.6 lb

## 2022-06-03 DIAGNOSIS — Z1231 Encounter for screening mammogram for malignant neoplasm of breast: Secondary | ICD-10-CM | POA: Diagnosis not present

## 2022-06-03 DIAGNOSIS — I10 Essential (primary) hypertension: Secondary | ICD-10-CM

## 2022-06-03 DIAGNOSIS — G43119 Migraine with aura, intractable, without status migrainosus: Secondary | ICD-10-CM | POA: Diagnosis not present

## 2022-06-03 MED ORDER — CLONIDINE HCL 0.2 MG PO TABS: 0.2000 mg | ORAL_TABLET | Freq: Two times a day (BID) | ORAL | 0 refills | Status: DC

## 2022-06-03 NOTE — Progress Notes (Signed)
Subjective:    Patient ID: Dana Greer, female    DOB: 12/15/1970, 51 y.o.   MRN: [id number]  HPI Pt presents for breast exam so she can schedule a mammogram and ER f/u  Wt Readings from Last 3 Encounters:  06/03/22 175 lb 9.6 oz (79.7 kg)  06/02/22 194 lb 0.1 oz (88 kg)  05/18/22 195 lb 2 oz (88.5 kg)   33.18 kg/m  Breasts stay tender all the time Months  Avoids too tight bras  No h/o trauma    Missed clonidine 0.2 mg bid this am   Was seen in ER yesterday for ha, nausea abd pain and malaise with elevated bp Episodic nausea and headache are common for her  Presented with systolic bp of 073X CT head was reassuring Tx with hydralazine 10 mg IV Migraine cocktail EKG stable  2.5 mg IV dropeidol for nausea   Symptoms improved and systolic bp returned to 106  Tox screen pos for THC and benzo   C/o profuse sweating for 2-3 mo  Lab Results  Component Value Date   CREATININE 1.26 (H) 06/02/2022   BUN 14 06/02/2022   NA 140 06/02/2022   K 3.3 (L) 06/02/2022   CL 107 06/02/2022   CO2 21 (L) 06/02/2022   Lab Results  Component Value Date   WBC 9.4 06/02/2022   HGB 14.1 06/02/2022   HCT 41.5 06/02/2022   MCV 97.6 06/02/2022   PLT 173 06/02/2022     BP Readings from Last 3 Encounters:  06/03/22 (!) 188/102  06/02/22 (!) 180/85  05/18/22 (!) 130/100   Pulse Readings from Last 3 Encounters:  06/03/22 (!) 154  06/02/22 79  05/18/22 80    Clonidine 0.2 mg bid- missed dose this am  Pulse and bp are up  ? Rebound    Patient Active Problem List   Diagnosis Date Noted   Dysphagia 05/16/2022   Rhabdomyolysis 05/05/2022   Heat exhaustion, unspecified, sequela 05/05/2022   Generalized weakness 05/05/2022   Obesity (BMI 30-39.9)    Rash and nonspecific skin eruption 11/15/2021   Perioral dermatitis 11/02/2021   Mixed incontinence 08/16/2021   Elevated serum creatinine 08/12/2021   Urinary frequency 08/12/2021   Herpes zoster without complication  26/94/8546   Hand pain, left 10/14/2020   Swelling of hand joint, left 10/14/2020   TMJ (temporomandibular joint syndrome) 08/18/2020   Non-intractable cyclical vomiting 27/12/5007   Left-sided back pain 02/25/2020   Hx of adenomatous polyp of colon 02/14/2020   Seasonal allergic rhinitis 01/21/2020   Chronic cough 10/04/2019   Headache, post-traumatic, acute 01/28/2019   Head injury, closed 01/17/2019   Abnormal vaginal bleeding 07/06/2018   Neck pain 06/25/2018   Chronic migraine 04/12/2018   B12 deficiency 01/10/2018   Foot pain, bilateral 01/09/2018   Hand paresthesia 01/09/2018   Vitamin D deficiency 01/09/2018   Upper abdominal pain 11/22/2016   AKI (acute kidney injury) ([place]) 11/22/2016   Chronic myofascial pain 10/26/2016   Encounter for screening mammogram for breast cancer 10/25/2016   HSV-2 infection 08/17/2016   Adverse effects of medication 11/25/2015   Vertigo 10/08/2015   Carpal tunnel syndrome 03/16/2015   Gout 12/22/2014   Vertiginous migraine 02/03/2014   Intractable migraine 08/31/2012   Osteoarthritis 05/11/2012   Female bladder prolapse 05/11/2012   ADJ DISORDER WITH MIXED ANXIETY & DEPRESSED MOOD 06/30/2008   HYPERHIDROSIS 01/25/2008   BACK PAIN, LUMBAR, CHRONIC 10/16/2007   REACTION, ACUTE STRESS W/EMOTIONAL DSTURB 08/01/2007   Prediabetes 08/01/2007  Hypertension 08/01/2007   Goiter 07/11/2007   POLYCYSTIC OVARIAN DISEASE 05/24/2007   HIRSUTISM 05/24/2007   Arthropathy, multiple sites 05/24/2007   Past Medical History:  Diagnosis Date   Arthritis    Depression    Fibromyalgia    Headache(784.0)    Hx of adenomatous polyp of colon 02/14/2020   Hypertension    Immune deficiency disorder ([place])    Migraine headache    Shingles    Stroke ([place])    Thrombocytopenia ([place]) 10/01/2012   Past Surgical History:  Procedure Laterality Date   ABDOMINAL HYSTERECTOMY     CARPAL TUNNEL RELEASE Left 09/25/2018   Procedure: LEFT CARPAL TUNNEL RELEASE;   Surgeon: Daryll Brod, MD;  Location: [facility];  Service: Orthopedics;  Laterality: Left;   CESAREAN SECTION  2008   x1   PARTIAL HYSTERECTOMY  2012   due to fibroid   UPPER GASTROINTESTINAL ENDOSCOPY     WISDOM TOOTH EXTRACTION  1995   Social History   Tobacco Use   Smoking status: Never   Smokeless tobacco: Never  Vaping Use   Vaping Use: Never used  Substance Use Topics   Alcohol use: Yes    Comment: occasional   Drug use: Yes    Types: Marijuana    Comment: smokes small amount twice daily   Family History  Problem Relation Age of Onset   Diabetes Father    COPD Father    Healthy Mother    Breast cancer Paternal Aunt    Colon cancer Paternal Grandmother    Rectal cancer Paternal Grandmother    Lung cancer Paternal Uncle    Lung cancer Paternal Aunt    Esophageal cancer Cousin    Stomach cancer Neg Hx    Allergies  Allergen Reactions   Trazodone And Nefazodone Nausea And Vomiting   Amoxicillin Other (See Comments)    Yeast infection    Trazodone Nausea And Vomiting   Carafate [Sucralfate] Nausea And Vomiting   Current Outpatient Medications on File Prior to Visit  Medication Sig Dispense Refill   AIMOVIG 140 MG/ML SOAJ Inject into the skin.     ALPRAZolam (XANAX) 1 MG tablet Take 1 mg by mouth 4 (four) times daily as needed for anxiety or sleep.      amitriptyline (ELAVIL) 25 MG tablet Take 2 tablets (50 mg total) by mouth daily. 60 tablet 0   cyclobenzaprine (FLEXERIL) 10 MG tablet Take 1 tablet (10 mg total) by mouth at bedtime as needed for muscle spasms. 30 tablet 3   EPINEPHrine (EPIPEN 2-PAK) 0.3 mg/0.3 mL IJ SOAJ injection Inject 0.3 mg into the muscle as needed for anaphylaxis. 2 each 1   etonogestrel (NEXPLANON) 68 MG IMPL implant Inject 1 each into the skin continuous. Reported on 01/27/2016     famotidine (PEPCID) 20 MG tablet TAKE 1 TABLET BY MOUTH 2 TIMES DAILY 60 tablet 3   Galcanezumab-gnlm (EMGALITY Learned) Inject into the skin.      meclizine (ANTIVERT) 25 MG tablet Take 50 mg by mouth as needed for dizziness.     ondansetron (ZOFRAN) 8 MG tablet Take 8 mg by mouth 3 (three) times daily.     promethazine (PHENERGAN) 25 MG suppository Place 1 suppository (25 mg total) rectally every 6 (six) hours as needed for nausea or vomiting. 20 each 0   valACYclovir (VALTREX) 500 MG tablet TAKE 1 TABLET BY MOUTH ONCE DAILY 30 tablet 5   No current facility-administered medications on file prior to visit.  Review of Systems  Constitutional:  Positive for fatigue. Negative for activity change, appetite change, fever and unexpected weight change.  HENT:  Negative for congestion, ear pain, rhinorrhea, sinus pressure and sore throat.   Eyes:  Negative for pain, redness and visual disturbance.  Respiratory:  Negative for cough, shortness of breath and wheezing.   Cardiovascular:  Negative for chest pain and palpitations.  Gastrointestinal:  Positive for nausea. Negative for abdominal pain, blood in stool, constipation, diarrhea and vomiting.  Endocrine: Negative for polydipsia and polyuria.  Genitourinary:  Negative for dysuria, frequency and urgency.  Musculoskeletal:  Negative for arthralgias, back pain and myalgias.  Skin:  Negative for pallor and rash.  Allergic/Immunologic: Negative for environmental allergies.  Neurological:  Positive for headaches. Negative for dizziness and syncope.  Hematological:  Negative for adenopathy. Does not bruise/bleed easily.  Psychiatric/Behavioral:  Negative for decreased concentration and dysphoric mood. The patient is not nervous/anxious.        Objective:   Physical Exam Constitutional:      General: She is not in acute distress.    Appearance: Normal appearance. She is well-developed. She is obese. She is not ill-appearing or diaphoretic.  HENT:     Head: Normocephalic and atraumatic.     Mouth/Throat:     Mouth: Mucous membranes are moist.  Eyes:     General: No scleral icterus.        Right eye: No discharge.        Left eye: No discharge.     Conjunctiva/sclera: Conjunctivae normal.     Pupils: Pupils are equal, round, and reactive to light.  Neck:     Thyroid: No thyromegaly.     Vascular: No carotid bruit or JVD.  Cardiovascular:     Rate and Rhythm: Regular rhythm. Tachycardia present.     Heart sounds: Normal heart sounds.     No gallop.  Pulmonary:     Effort: Pulmonary effort is normal. No respiratory distress.     Breath sounds: Normal breath sounds. No wheezing or rales.  Abdominal:     General: Abdomen is protuberant. Bowel sounds are normal. There is no distension or abdominal bruit.     Palpations: Abdomen is soft. There is no hepatomegaly or splenomegaly.     Tenderness: There is no abdominal tenderness.  Genitourinary:    Comments: Breast exam: No mass, nodules, thickening, bulging, retraction, inflamation, nipple discharge or skin changes noted.  No axillary or clavicular LA.    Breasts are diffusely tender  Tissue is notably dense   Musculoskeletal:     Cervical back: Normal range of motion and neck supple. No rigidity or tenderness.     Right lower leg: No edema.     Left lower leg: No edema.  Lymphadenopathy:     Cervical: No cervical adenopathy.  Skin:    General: Skin is warm and dry.     Coloration: Skin is not pale.     Findings: No rash.  Neurological:     Mental Status: She is alert.     Coordination: Coordination normal.     Deep Tendon Reflexes: Reflexes are normal and symmetric. Reflexes normal.  Psychiatric:        Mood and Affect: Mood normal.           Assessment & Plan:   Problem List Items Addressed This Visit       Cardiovascular and Mediastinum   Hypertension - Primary    BP: (!) 188/102  Pulse and bp are up likely due to missing clonidine this am (takes 0.2 mg bid)  Disc rebound phenomenon with this medcation  Will take medication at home and monitor her bp  bp in ER may have been up due to  vomiting and pain Reviewed hospital records, lab results and studies in detail   Plan f/u in a week for re check  ER precautions discussed        Relevant Medications   cloNIDine (CATAPRES) 0.2 MG tablet   Intractable migraine    Recent ER visit  Reviewed hospital records, lab results and studies in detail  Improved Hydrating now/keeping things down  Has zofran for nausea       Relevant Medications   cloNIDine (CATAPRES) 0.2 MG tablet     Other   Encounter for screening mammogram for breast cancer    Due for screening  Dense tissue on exam  Suspect pain is due to this (chronic) Recommend avoiding excess caffeine       Relevant Orders   MM 3D SCREEN BREAST BILATERAL

## 2022-06-03 NOTE — Patient Instructions (Addendum)
Please call and schedule your mammogram    Go home  Take your clonidine  If your blood pressure or pulse do not normalize over the weekend please call   Drink lots of fluids Advance diet gradually    Follow up next week

## 2022-06-05 NOTE — Assessment & Plan Note (Signed)
BP: (!) 188/102     Pulse and bp are up likely due to missing clonidine this am (takes 0.2 mg bid)  Disc rebound phenomenon with this medcation  Will take medication at home and monitor her bp  bp in ER may have been up due to vomiting and pain Reviewed hospital records, lab results and studies in detail   Plan f/u in a week for re check  ER precautions discussed

## 2022-06-05 NOTE — Assessment & Plan Note (Signed)
Recent ER visit  Reviewed hospital records, lab results and studies in detail  Improved Hydrating now/keeping things down  Has zofran for nausea

## 2022-06-05 NOTE — Assessment & Plan Note (Signed)
Due for screening  Dense tissue on exam  Suspect pain is due to this (chronic) Recommend avoiding excess caffeine

## 2022-06-06 ENCOUNTER — Encounter: Payer: Self-pay | Admitting: Family Medicine

## 2022-06-07 ENCOUNTER — Encounter: Payer: Self-pay | Admitting: Family Medicine

## 2022-06-08 ENCOUNTER — Ambulatory Visit: Payer: Medicare Other | Admitting: Family Medicine

## 2022-06-08 ENCOUNTER — Encounter: Payer: Self-pay | Admitting: Family Medicine

## 2022-06-08 ENCOUNTER — Telehealth (INDEPENDENT_AMBULATORY_CARE_PROVIDER_SITE_OTHER): Payer: Medicare Other | Admitting: Family Medicine

## 2022-06-08 VITALS — BP 132/87 | HR 84 | Ht 61.0 in | Wt 170.0 lb

## 2022-06-08 DIAGNOSIS — I10 Essential (primary) hypertension: Secondary | ICD-10-CM | POA: Diagnosis not present

## 2022-06-08 NOTE — Telephone Encounter (Signed)
Called and spoke w/ pt  switch her to My chart  visit, pt is able to check her b/p at home, pt said that she is  no having any symptoms however she has been feeling well. She said that her and daughter are in areas all the time, and her daughter is coughing,they both haven't taken a test yet. She is going to  get tested.

## 2022-06-08 NOTE — Progress Notes (Signed)
Virtual Visit via Video Note  I connected with Dana Greer on 06/08/22 at  9:30 AM EDT by a video enabled telemedicine application and verified that I am speaking with the correct person using two identifiers.  Location: Patient: her car Provider: office    I discussed the limitations of evaluation and management by telemedicine and the availability of in person appointments. The patient expressed understanding and agreed to proceed.  Parties involved in encounter  Patient: Dana Greer  Provider:  Loura Pardon MD   History of Present Illness: Pt presents for f/u of HTN  May have been exp to covid so wanted to do this visit virtually   She has had a little drainage and cough Not unusual  Has not done a covid test at home   Last visit she missed her clonidine so bp and pulse were elevated   BP Readings from Last 3 Encounters:  06/08/22 132/87  06/03/22 (!) 188/102  06/02/22 (!) 180/85   Once she takes her medicine   Pulse Readings from Last 3 Encounters:  06/08/22 84  06/03/22 (!) 154  06/02/22 79    Goes to [place] med treatment sessions = disc her excessive sweating and nausea - acupuncture  Herbs [place] ? - like incense (holds over her abdomen)  Thinks overall it is helping   Treatment did help  Going twice per week (in [place])   Trying to get back to eating normally now     Patient Active Problem List   Diagnosis Date Noted   Dysphagia 05/16/2022   Rhabdomyolysis 05/05/2022   Heat exhaustion, unspecified, sequela 05/05/2022   Generalized weakness 05/05/2022   Obesity (BMI 30-39.9)    Rash and nonspecific skin eruption 11/15/2021   Perioral dermatitis 11/02/2021   Mixed incontinence 08/16/2021   Elevated serum creatinine 08/12/2021   Urinary frequency 08/12/2021   Herpes zoster without complication 52/77/8242   Hand pain, left 10/14/2020   Swelling of hand joint, left 10/14/2020   TMJ (temporomandibular joint syndrome) 08/18/2020    Non-intractable cyclical vomiting 35/36/1443   Left-sided back pain 02/25/2020   Hx of adenomatous polyp of colon 02/14/2020   Seasonal allergic rhinitis 01/21/2020   Chronic cough 10/04/2019   Headache, post-traumatic, acute 01/28/2019   Head injury, closed 01/17/2019   Abnormal vaginal bleeding 07/06/2018   Neck pain 06/25/2018   Chronic migraine 04/12/2018   B12 deficiency 01/10/2018   Foot pain, bilateral 01/09/2018   Hand paresthesia 01/09/2018   Vitamin D deficiency 01/09/2018   Upper abdominal pain 11/22/2016   AKI (acute kidney injury) ([place]) 11/22/2016   Chronic myofascial pain 10/26/2016   Encounter for screening mammogram for breast cancer 10/25/2016   HSV-2 infection 08/17/2016   Adverse effects of medication 11/25/2015   Vertigo 10/08/2015   Carpal tunnel syndrome 03/16/2015   Gout 12/22/2014   Vertiginous migraine 02/03/2014   Intractable migraine 08/31/2012   Osteoarthritis 05/11/2012   Female bladder prolapse 05/11/2012   ADJ DISORDER WITH MIXED ANXIETY & DEPRESSED MOOD 06/30/2008   HYPERHIDROSIS 01/25/2008   BACK PAIN, LUMBAR, CHRONIC 10/16/2007   REACTION, ACUTE STRESS W/EMOTIONAL DSTURB 08/01/2007   Prediabetes 08/01/2007   Hypertension 08/01/2007   Goiter 07/11/2007   POLYCYSTIC OVARIAN DISEASE 05/24/2007   HIRSUTISM 05/24/2007   Arthropathy, multiple sites 05/24/2007   Past Medical History:  Diagnosis Date   Arthritis    Depression    Fibromyalgia    Headache(784.0)    Hx of adenomatous polyp of colon 02/14/2020   Hypertension  Immune deficiency disorder ([place])    Migraine headache    Shingles    Stroke ([place])    Thrombocytopenia ([place]) 10/01/2012   Past Surgical History:  Procedure Laterality Date   ABDOMINAL HYSTERECTOMY     CARPAL TUNNEL RELEASE Left 09/25/2018   Procedure: LEFT CARPAL TUNNEL RELEASE;  Surgeon: Daryll Brod, MD;  Location: [facility];  Service: Orthopedics;  Laterality: Left;   CESAREAN SECTION  2008   x1    PARTIAL HYSTERECTOMY  2012   due to fibroid   UPPER GASTROINTESTINAL ENDOSCOPY     WISDOM TOOTH EXTRACTION  1995   Social History   Tobacco Use   Smoking status: Never   Smokeless tobacco: Never  Vaping Use   Vaping Use: Never used  Substance Use Topics   Alcohol use: Yes    Comment: occasional   Drug use: Yes    Types: Marijuana    Comment: smokes small amount twice daily   Family History  Problem Relation Age of Onset   Diabetes Father    COPD Father    Healthy Mother    Breast cancer Paternal Aunt    Colon cancer Paternal Grandmother    Rectal cancer Paternal Grandmother    Lung cancer Paternal Uncle    Lung cancer Paternal Aunt    Esophageal cancer Cousin    Stomach cancer Neg Hx    Allergies  Allergen Reactions   Trazodone And Nefazodone Nausea And Vomiting   Amoxicillin Other (See Comments)    Yeast infection    Trazodone Nausea And Vomiting   Carafate [Sucralfate] Nausea And Vomiting   Current Outpatient Medications on File Prior to Visit  Medication Sig Dispense Refill   AIMOVIG 140 MG/ML SOAJ Inject into the skin.     ALPRAZolam (XANAX) 1 MG tablet Take 1 mg by mouth 4 (four) times daily as needed for anxiety or sleep.      amitriptyline (ELAVIL) 25 MG tablet Take 2 tablets (50 mg total) by mouth daily. 60 tablet 0   cloNIDine (CATAPRES) 0.2 MG tablet Take 1 tablet (0.2 mg total) by mouth 2 (two) times daily. 1 tablet 0   cyclobenzaprine (FLEXERIL) 10 MG tablet Take 1 tablet (10 mg total) by mouth at bedtime as needed for muscle spasms. 30 tablet 3   EPINEPHrine (EPIPEN 2-PAK) 0.3 mg/0.3 mL IJ SOAJ injection Inject 0.3 mg into the muscle as needed for anaphylaxis. 2 each 1   etonogestrel (NEXPLANON) 68 MG IMPL implant Inject 1 each into the skin continuous. Reported on 01/27/2016     famotidine (PEPCID) 20 MG tablet TAKE 1 TABLET BY MOUTH 2 TIMES DAILY 60 tablet 3   Galcanezumab-gnlm (EMGALITY [place]) Inject into the skin.     meclizine (ANTIVERT) 25 MG tablet  Take 50 mg by mouth as needed for dizziness.     ondansetron (ZOFRAN) 8 MG tablet Take 8 mg by mouth 3 (three) times daily.     promethazine (PHENERGAN) 25 MG suppository Place 1 suppository (25 mg total) rectally every 6 (six) hours as needed for nausea or vomiting. 20 each 0   valACYclovir (VALTREX) 500 MG tablet TAKE 1 TABLET BY MOUTH ONCE DAILY 30 tablet 5   No current facility-administered medications on file prior to visit.   Review of Systems  Constitutional:  Negative for chills, fever and malaise/fatigue.  HENT:  Negative for congestion, ear pain, sinus pain and sore throat.   Eyes:  Negative for blurred vision, discharge and redness.  Respiratory:  Negative for cough, shortness of breath and stridor.   Cardiovascular:  Negative for chest pain, palpitations and leg swelling.  Gastrointestinal:  Positive for nausea. Negative for abdominal pain, diarrhea and vomiting.       Nausea is improved today  Musculoskeletal:  Negative for myalgias.  Skin:  Negative for rash.  Neurological:  Negative for dizziness and headaches.    Observations/Objective: Patient appears well, in no distress Weight is baseline  No facial swelling or asymmetry Normal voice-not hoarse and no slurred speech No obvious tremor or mobility impairment Moving neck and UEs normally Able to hear the call well  No cough or shortness of breath during interview  Talkative and mentally sharp with no cognitive changes No skin changes on face or neck , no rash or pallor Affect is normal    Assessment and Plan: Problem List Items Addressed This Visit       Cardiovascular and Mediastinum   Hypertension - Primary    BP: 132/87  Pulse of 84  Getting back on track with clonidine but now missed dose last night  Will continue this and monitor  Clonidine 0.2 mg bid, tolerates well         Follow Up Instructions:   Do a covid test at home and let us know if it turns positive  I'm glad BP and pulse are  improved  Continue clonidine and let us know if you have issues  Don't skip doses    I discussed the assessment and treatment plan with the patient. The patient was provided an opportunity to ask questions and all were answered. The patient agreed with the plan and demonstrated an understanding of the instructions.   The patient was advised to call back or seek an in-person evaluation if the symptoms worsen or if the condition fails to improve as anticipated.     Loura Pardon, MD

## 2022-06-08 NOTE — Patient Instructions (Addendum)
Do a covid test at home and let us know if it turns positive  I'm glad BP and pulse are improved  Continue clonidine and let us know if you have issues  Don't skip doses

## 2022-06-08 NOTE — Assessment & Plan Note (Addendum)
BP: 132/87  Pulse of 84  Getting back on track with clonidine but now missed dose last night  Will continue this and monitor  Clonidine 0.2 mg bid, tolerates well

## 2022-06-15 ENCOUNTER — Telehealth: Payer: Self-pay | Admitting: Family Medicine

## 2022-06-15 DIAGNOSIS — R7303 Prediabetes: Secondary | ICD-10-CM

## 2022-06-15 DIAGNOSIS — I10 Essential (primary) hypertension: Secondary | ICD-10-CM

## 2022-06-15 DIAGNOSIS — E538 Deficiency of other specified B group vitamins: Secondary | ICD-10-CM

## 2022-06-15 NOTE — Telephone Encounter (Signed)
-----   Message from Ellamae Sia sent at 05/31/2022  4:03 PM EDT ----- Regarding: Lab orders for Thursday, 8.17.23 Lab orders, thanks

## 2022-06-17 ENCOUNTER — Other Ambulatory Visit: Payer: Medicare Other

## 2022-06-20 ENCOUNTER — Other Ambulatory Visit (INDEPENDENT_AMBULATORY_CARE_PROVIDER_SITE_OTHER): Payer: Medicare Other

## 2022-06-20 DIAGNOSIS — R7303 Prediabetes: Secondary | ICD-10-CM

## 2022-06-20 DIAGNOSIS — I10 Essential (primary) hypertension: Secondary | ICD-10-CM | POA: Diagnosis not present

## 2022-06-20 LAB — COMPREHENSIVE METABOLIC PANEL
ALT: 9 U/L (ref 0–35)
AST: 13 U/L (ref 0–37)
Albumin: 3.8 g/dL (ref 3.5–5.2)
Alkaline Phosphatase: 42 U/L (ref 39–117)
BUN: 9 mg/dL (ref 6–23)
CO2: 28 mEq/L (ref 19–32)
Calcium: 9.2 mg/dL (ref 8.4–10.5)
Chloride: 105 mEq/L (ref 96–112)
Creatinine, Ser: 1.27 mg/dL — ABNORMAL HIGH (ref 0.40–1.20)
GFR: 48.88 mL/min — ABNORMAL LOW (ref >60.00)
Glucose, Bld: 115 mg/dL — ABNORMAL HIGH (ref 70–99)
Potassium: 4.1 mEq/L (ref 3.5–5.1)
Sodium: 140 mEq/L (ref 135–145)
Total Bilirubin: 0.4 mg/dL (ref 0.2–1.2)
Total Protein: 6.4 g/dL (ref 6.0–8.3)

## 2022-06-20 LAB — HEMOGLOBIN A1C: Hgb A1c MFr Bld: 6 % (ref 4.6–6.5)

## 2022-06-24 ENCOUNTER — Encounter: Payer: Self-pay | Admitting: Family Medicine

## 2022-06-24 NOTE — Telephone Encounter (Addendum)
I spoke with pt; pt said she started sweating profusely with SOB again this morning; BP 139/11  P 94 pt said has H/A pain level 7 - 8 with dizziness where room is spinning around alternating with lightheadedness. No available appts at [facility] or [facility]. Offered pt appt at [facility] and pt said she did not want to go there again; pt said she will go to [facility] in [place]. Pt said she does not need to go to ED.Sending note to Dr Glori Bickers who is out of office; Dr Damita Dunnings who is in office and [place] CMA.

## 2022-06-24 NOTE — Telephone Encounter (Signed)
Noted. Thanks.  Routed to PCP as FYI.  

## 2022-06-24 NOTE — Telephone Encounter (Signed)
Will watch for correspondence from [facility], agree with adv

## 2022-06-29 ENCOUNTER — Encounter: Payer: Self-pay | Admitting: Family Medicine

## 2022-06-29 ENCOUNTER — Emergency Department (HOSPITAL_BASED_OUTPATIENT_CLINIC_OR_DEPARTMENT_OTHER): Payer: Medicare Other | Admitting: Radiology

## 2022-06-29 ENCOUNTER — Other Ambulatory Visit: Payer: Self-pay

## 2022-06-29 ENCOUNTER — Emergency Department (HOSPITAL_BASED_OUTPATIENT_CLINIC_OR_DEPARTMENT_OTHER)
Admission: EM | Admit: 2022-06-29 | Discharge: 2022-06-29 | Disposition: A | Discharge status: Home / Self Care | Payer: Medicare Other | Attending: Emergency Medicine | Admitting: Emergency Medicine

## 2022-06-29 ENCOUNTER — Encounter (HOSPITAL_BASED_OUTPATIENT_CLINIC_OR_DEPARTMENT_OTHER): Payer: Self-pay

## 2022-06-29 DIAGNOSIS — R61 Generalized hyperhidrosis: Secondary | ICD-10-CM | POA: Diagnosis not present

## 2022-06-29 DIAGNOSIS — R112 Nausea with vomiting, unspecified: Secondary | ICD-10-CM | POA: Insufficient documentation

## 2022-06-29 DIAGNOSIS — Z79899 Other long term (current) drug therapy: Secondary | ICD-10-CM | POA: Diagnosis not present

## 2022-06-29 DIAGNOSIS — R079 Chest pain, unspecified: Secondary | ICD-10-CM | POA: Diagnosis not present

## 2022-06-29 DIAGNOSIS — R0602 Shortness of breath: Secondary | ICD-10-CM | POA: Diagnosis not present

## 2022-06-29 DIAGNOSIS — I10 Essential (primary) hypertension: Secondary | ICD-10-CM | POA: Diagnosis not present

## 2022-06-29 LAB — BASIC METABOLIC PANEL
Anion gap: 8 (ref 5–15)
BUN: 10 mg/dL (ref 6–20)
CO2: 25 mmol/L (ref 22–32)
Calcium: 9.8 mg/dL (ref 8.9–10.3)
Chloride: 108 mmol/L (ref 98–111)
Creatinine, Ser: 1.05 mg/dL — ABNORMAL HIGH (ref 0.44–1.00)
GFR, Estimated: >60 mL/min (ref >60)
Glucose, Bld: 123 mg/dL — ABNORMAL HIGH (ref 70–99)
Potassium: 4.2 mmol/L (ref 3.5–5.1)
Sodium: 141 mmol/L (ref 135–145)

## 2022-06-29 LAB — TROPONIN I (HIGH SENSITIVITY): Troponin I (High Sensitivity): 4 ng/L (ref <18)

## 2022-06-29 LAB — CBC
HCT: 38.9 % (ref 36.0–46.0)
Hemoglobin: 13.2 g/dL (ref 12.0–15.0)
MCH: 33.2 pg (ref 26.0–34.0)
MCHC: 33.9 g/dL (ref 30.0–36.0)
MCV: 97.7 fL (ref 80.0–100.0)
Platelets: 195 10*3/uL (ref 150–400)
RBC: 3.98 MIL/uL (ref 3.87–5.11)
RDW: 14.3 % (ref 11.5–15.5)
WBC: 5.9 10*3/uL (ref 4.0–10.5)
nRBC: 0 % (ref 0.0–0.2)

## 2022-06-29 LAB — HCG, SERUM, QUALITATIVE: Preg, Serum: NEGATIVE

## 2022-06-29 NOTE — Telephone Encounter (Signed)
See pt's note regarding her going to hospital

## 2022-06-29 NOTE — ED Triage Notes (Signed)
Patient here POV from Home.  Endorses This AM, the Patient has been having HTN, SOB, Diaphoresis, Vomiting. States she awoke this AM with these Symptoms today. Originally came to ED but LWBS and then returned.   Took Zofran, Clonidine at Home and returned for Evaluation. Still endorses SOB and Weakness.  NAD Noted during Triage. A&Ox4. GCS 15. Ambulatory.

## 2022-06-29 NOTE — Telephone Encounter (Signed)
I spoke with Dana Greer; Dana Greer said this morning started with sweating, SOB,and in the last hour Dana Greer has vomited x 4; Dana Greer is still very nauseated and does not think she can drink anything due to nausea. Dana Greer has been continuously lightheaded this morning also. Dana Greer feels weak. No diarrhea, no abd pain and no fever. Dana Greer BP 157/103 P 80. Last time Dana Greer had these symptoms was last wk but Dana Greer did not go to UC. Dana Greer is going to ED at [facility] and when Dana Greer feels better will cb for FU appt with Dr Glori Bickers. Sending note to Dr Glori Bickers.

## 2022-06-29 NOTE — ED Notes (Signed)
Reviewed AVS/discharge instruction with patient. Time allotted for and all questions answered. Patient is agreeable for d/c and escorted to ed exit by staff.  

## 2022-06-29 NOTE — ED Provider Notes (Signed)
[facility] EMERGENCY DEPT Provider Note   CSN: [id number] Arrival date & time: 06/29/22  1342     History  Chief Complaint  Patient presents with   Hypertension    Dana Greer is a 51 y.o. female.  Pt reports she was concerned because her blood pressure was elevated today.  Pt reports she has vomited 4 times today.  Pt took her medications before coming in and reports she is feeling better now.  Pt worried about her kidneys and her heart.  Pt reports she has had episodes of sweating and vomiting on and off for the past 11 years.  Pt reports she has been seen by allergist, Gi, and primary care.  Pt is suppose to follow up with cardiology for evaluation   The history is provided by the patient. No language interpreter was used.  Hypertension This is a new problem. The current episode started 3 to 5 hours ago. The problem occurs constantly. The problem has been gradually worsening. Pertinent negatives include no chest pain, no abdominal pain and no headaches. Nothing aggravates the symptoms. Nothing relieves the symptoms. She has tried nothing for the symptoms. The treatment provided no relief.       Home Medications Prior to Admission medications   Medication Sig Start Date End Date Taking? Authorizing Provider  AIMOVIG 140 MG/ML SOAJ Inject into the skin. 12/22/21   [provider]  ALPRAZolam Duanne Moron) 1 MG tablet Take 1 mg by mouth 4 (four) times daily as needed for anxiety or sleep.     [provider]  amitriptyline (ELAVIL) 25 MG tablet Take 2 tablets (50 mg total) by mouth daily. 05/18/20   Kayleen Memos, DO  cloNIDine (CATAPRES) 0.2 MG tablet Take 1 tablet (0.2 mg total) by mouth 2 (two) times daily. 06/03/22   [name], Wynelle Fanny, MD  cyclobenzaprine (FLEXERIL) 10 MG tablet Take 1 tablet (10 mg total) by mouth at bedtime as needed for muscle spasms. 10/05/21   [name], Wynelle Fanny, MD  EPINEPHrine (EPIPEN 2-PAK) 0.3 mg/0.3 mL IJ SOAJ injection Inject  0.3 mg into the muscle as needed for anaphylaxis. 12/30/21   Kennith Gain, MD  etonogestrel (NEXPLANON) 68 MG IMPL implant Inject 1 each into the skin continuous. Reported on 01/27/2016    [provider]  famotidine (PEPCID) 20 MG tablet TAKE 1 TABLET BY MOUTH 2 TIMES DAILY 01/25/22   [name], Wynelle Fanny, MD  Galcanezumab-gnlm [facility]) Inject into the skin.    [provider]  meclizine (ANTIVERT) 25 MG tablet Take 50 mg by mouth as needed for dizziness.    [provider]  ondansetron (ZOFRAN) 8 MG tablet Take 8 mg by mouth 3 (three) times daily. 06/24/21   [provider]  promethazine (PHENERGAN) 25 MG suppository Place 1 suppository (25 mg total) rectally every 6 (six) hours as needed for nausea or vomiting. 04/26/22   [name], Wynelle Fanny, MD  valACYclovir (VALTREX) 500 MG tablet TAKE 1 TABLET BY MOUTH ONCE DAILY 01/27/22   [name], Wynelle Fanny, MD      Allergies    Trazodone and nefazodone, Amoxicillin, Trazodone, and Carafate [sucralfate]    Review of Systems   Review of Systems  Cardiovascular:  Negative for chest pain.  Gastrointestinal:  Negative for abdominal pain.  Neurological:  Negative for headaches.  All other systems reviewed and are negative.   Physical Exam Updated Vital Signs BP (!) 136/99   Pulse (!) 56   Temp 98.6 F (37  C)   Resp 13   Ht '5\' 1"'$  (1.549 m)   Wt 77.1 kg   LMP 10/16/2019 (Within Days) Comment: had hysterectomy-still has half of her uterus and ovaries  SpO2 100%   BMI 32.12 kg/m  Physical Exam Vitals and nursing note reviewed.  Constitutional:      Appearance: She is well-developed.  HENT:     Head: Normocephalic.  Eyes:     Pupils: Pupils are equal, round, and reactive to light.  Cardiovascular:     Rate and Rhythm: Normal rate.  Pulmonary:     Effort: Pulmonary effort is normal.  Abdominal:     General: Abdomen is flat. There is no distension.  Musculoskeletal:        General: Normal range of motion.      Cervical back: Normal range of motion.  Neurological:     Mental Status: She is alert and oriented to person, place, and time.     ED Results / Procedures / Treatments   Labs (all labs ordered are listed, but only abnormal results are displayed) Labs Reviewed  BASIC METABOLIC PANEL - Abnormal; Notable for the following components:      Result Value   Glucose, Bld 123 (*)    Creatinine, Ser 1.05 (*)    All other components within normal limits  CBC  HCG, SERUM, QUALITATIVE  TROPONIN I (HIGH SENSITIVITY)  TROPONIN I (HIGH SENSITIVITY)    EKG None  Radiology DG Chest 2 View  Result Date: 06/29/2022 CLINICAL DATA:  Chest pain short of breath EXAM: CHEST - 2 VIEW COMPARISON:  Chest 05/05/2022 FINDINGS: Heart size and vascularity normal. Lungs clear without infiltrate effusion. Moderate dextroscoliosis thoracic spine unchanged. IMPRESSION: No active cardiopulmonary disease. Electronically Signed   By: Franchot Gallo M.D.   On: 06/29/2022 14:44    Procedures Procedures    Medications Ordered in ED Medications - No data to display  ED Course/ Medical Decision Making/ A&P                           Medical Decision Making Pt complains of evaluated blood pressure and vomiting.  Pt reports she has a history of the same.    Amount and/or Complexity of Data Reviewed Independent Historian: caregiver    Details: Family here with pain External Data Reviewed: notes.    Details: Primary care notes reviewed  Labs: ordered. Decision-making details documented in ED Course.    Details: Labs ordered reviewed and interpreted  Pt's bun and creatine have improved from previous labs.   Radiology: ordered and independent interpretation performed. Decision-making details documented in ED Course.    Details: Chest xray are normal    Risk Risk Details: Pt advised to continue her home medications.  Schedule follow up with your Physicain            Final Clinical Impression(s) / ED  Diagnoses Final diagnoses:  Essential hypertension  Nausea and vomiting, unspecified vomiting type    Rx / DC Orders ED Discharge Orders     None      An After Visit Summary was printed and given to the patient.    Fransico Meadow, Hershal Coria 06/29/22 1610    Elgie Congo, MD 06/30/22 [phone]

## 2022-06-29 NOTE — ED Notes (Signed)
She is awake, alert and in no distress. She states she took her b/p med at 1100 today.

## 2022-06-30 ENCOUNTER — Encounter: Payer: Self-pay | Admitting: Family Medicine

## 2022-07-04 ENCOUNTER — Telehealth: Payer: Self-pay | Admitting: Family Medicine

## 2022-07-04 DIAGNOSIS — N179 Acute kidney failure, unspecified: Secondary | ICD-10-CM

## 2022-07-04 DIAGNOSIS — I10 Essential (primary) hypertension: Secondary | ICD-10-CM

## 2022-07-04 NOTE — Telephone Encounter (Signed)
-----   Message from Ellamae Sia sent at 06/21/2022  3:57 PM EDT ----- Regarding: Lab orders for Tuesday, 9.5.23 Lab orders, thanks

## 2022-07-05 ENCOUNTER — Encounter: Payer: Self-pay | Admitting: Family Medicine

## 2022-07-05 ENCOUNTER — Other Ambulatory Visit (INDEPENDENT_AMBULATORY_CARE_PROVIDER_SITE_OTHER): Payer: Medicare Other

## 2022-07-05 ENCOUNTER — Ambulatory Visit (INDEPENDENT_AMBULATORY_CARE_PROVIDER_SITE_OTHER): Payer: Medicare Other | Admitting: Family Medicine

## 2022-07-05 VITALS — BP 118/68 | HR 51 | Temp 97.9°F | Ht 61.0 in | Wt 187.5 lb

## 2022-07-05 DIAGNOSIS — N179 Acute kidney failure, unspecified: Secondary | ICD-10-CM | POA: Diagnosis not present

## 2022-07-05 DIAGNOSIS — R7989 Other specified abnormal findings of blood chemistry: Secondary | ICD-10-CM

## 2022-07-05 DIAGNOSIS — I10 Essential (primary) hypertension: Secondary | ICD-10-CM | POA: Diagnosis not present

## 2022-07-05 DIAGNOSIS — G43709 Chronic migraine without aura, not intractable, without status migrainosus: Secondary | ICD-10-CM | POA: Diagnosis not present

## 2022-07-05 DIAGNOSIS — R110 Nausea: Secondary | ICD-10-CM

## 2022-07-05 DIAGNOSIS — R3129 Other microscopic hematuria: Secondary | ICD-10-CM | POA: Diagnosis not present

## 2022-07-05 LAB — POC URINALSYSI DIPSTICK (AUTOMATED)
Bilirubin, UA: NEGATIVE
Blood, UA: 200
Glucose, UA: NEGATIVE
Ketones, UA: NEGATIVE
Leukocytes, UA: NEGATIVE
Nitrite, UA: NEGATIVE
Protein, UA: POSITIVE — AB
Spec Grav, UA: >=1.03 — AB (ref 1.010–1.025)
Urobilinogen, UA: 0.2 E.U./dL
pH, UA: 6 (ref 5.0–8.0)

## 2022-07-05 LAB — BASIC METABOLIC PANEL
BUN: 12 mg/dL (ref 6–23)
CO2: 27 mEq/L (ref 19–32)
Calcium: 9.2 mg/dL (ref 8.4–10.5)
Chloride: 106 mEq/L (ref 96–112)
Creatinine, Ser: 1.15 mg/dL (ref 0.40–1.20)
GFR: 55.05 mL/min — ABNORMAL LOW (ref >60.00)
Glucose, Bld: 97 mg/dL (ref 70–99)
Potassium: 4 mEq/L (ref 3.5–5.1)
Sodium: 140 mEq/L (ref 135–145)

## 2022-07-05 NOTE — Progress Notes (Signed)
Subjective:    Patient ID: Dana Greer, female    DOB: [birth year], 51 y.o.   MRN: [id number]  HPI Pt presents for f/u of HTN and intermittent nausea   Wt Readings from Last 3 Encounters:  07/05/22 187 lb 8 oz (85 kg)  06/29/22 169 lb 15.6 oz (77.1 kg)  06/08/22 170 lb (77.1 kg)   35.43 kg/m    She was seen on 8/30 in ER at [facility]   Had vomited 4 times that day  Bp went up until she could keep down her medicine  By the time she got there bp was 136/99 Cxr normal   Lab Results  Component Value Date   CREATININE 1.05 (H) 06/29/2022   BUN 10 06/29/2022   NA 141 06/29/2022   K 4.2 06/29/2022   CL 108 06/29/2022   CO2 25 06/29/2022  We redrew this this am-pending  Fluid intake    Neg urine hcg Lab Results  Component Value Date   WBC 5.9 06/29/2022   HGB 13.2 06/29/2022   HCT 38.9 06/29/2022   MCV 97.7 06/29/2022   PLT 195 06/29/2022   Troponin was nl   Has episodic sweating and vomiting  Notes she always has some type of headache with this    Has seen  Allergy  GI   Promethazine 25 mg up to every 6 hours  This helps some  Zofran helps -picked up her last refill (8 mg) -was px suppositories and has on hand   Has seen a [place] medicine practitioner   H/o migraine Aimovig Emgality Elavil 25 mg   Has nexplanon implant   Had CT head 06/02/2022 IMPRESSION: 1. No evidence of acute intracranial abnormality. 2. Partially empty and expanded sella, which is often a normal anatomic variant but can be associated with idiopathic intracranial hypertension (pseudotumor cerebri).   HTN bp is stable today  No cp or palpitations or headaches or edema  No side effects to medicines  BP Readings from Last 3 Encounters:  07/05/22 118/68  06/29/22 (!) 127/91  06/08/22 132/87    Clonidine 0.2 mg bid  Was prev on amlodipine   Pulse Readings from Last 3 Encounters:  07/05/22 (!) 51  06/29/22 (!) 49  06/08/22 84   Bp is generally pretty good  when she keeps her medicine now   Episode today before 8 am  She felt like her stomach started to boil/ she felt hot  189/110 -did feel upset    Ua today dips pos for blood today  Goes to urology for micro hematuria Did cystoscopy  Did rule out any pathology   No uti symptoms    Just got rid of her headache now  Overall improving -that is great  Still has the flushing and vomiting   Neurology is - [facility] cardiology  She had echo recently- was god   Results for orders placed or performed in visit on 07/05/22  POCT Urinalysis Dipstick (Automated)  Result Value Ref Range   Color, UA Dark Yellow    Clarity, UA Clear    Glucose, UA Negative Negative   Bilirubin, UA Negative    Ketones, UA Negative    Spec Grav, UA >=1.030 (A) 1.010 - 1.025   Blood, UA 200 Ery/uL    pH, UA 6.0 5.0 - 8.0   Protein, UA Positive (A) Negative   Urobilinogen, UA 0.2 0.2 or 1.0 E.U./dL   Nitrite, UA Negative    Leukocytes, UA Negative  Negative   Patient Active Problem List   Diagnosis Date Noted   Dysphagia 05/16/2022   Rhabdomyolysis 05/05/2022   Heat exhaustion, unspecified, sequela 05/05/2022   Generalized weakness 05/05/2022   Obesity (BMI 30-39.9)    Rash and nonspecific skin eruption 11/15/2021   Perioral dermatitis 11/02/2021   Mixed incontinence 08/16/2021   Elevated serum creatinine 08/12/2021   Urinary frequency 08/12/2021   Herpes zoster without complication 42/68/3419   Hand pain, left 10/14/2020   Swelling of hand joint, left 10/14/2020   TMJ (temporomandibular joint syndrome) 08/18/2020   Non-intractable cyclical vomiting 62/22/9798   Left-sided back pain 02/25/2020   Hx of adenomatous polyp of colon 02/14/2020   Seasonal allergic rhinitis 01/21/2020   Chronic cough 10/04/2019   Headache, post-traumatic, acute 01/28/2019   Head injury, closed 01/17/2019   Abnormal vaginal bleeding 07/06/2018   Neck pain 06/25/2018   Migraine, chronic, without aura  04/12/2018   B12 deficiency 01/10/2018   Foot pain, bilateral 01/09/2018   Hand paresthesia 01/09/2018   Vitamin D deficiency 01/09/2018   Upper abdominal pain 11/22/2016   AKI (acute kidney injury) ([place]) 11/22/2016   Chronic myofascial pain 10/26/2016   Encounter for screening mammogram for breast cancer 10/25/2016   HSV-2 infection 08/17/2016   Adverse effects of medication 11/25/2015   Vertigo 10/08/2015   Carpal tunnel syndrome 03/16/2015   Gout 12/22/2014   Vertiginous migraine 02/03/2014   Intractable migraine 08/31/2012   Osteoarthritis 05/11/2012   Female bladder prolapse 05/11/2012   ADJ DISORDER WITH MIXED ANXIETY & DEPRESSED MOOD 06/30/2008   HYPERHIDROSIS 01/25/2008   BACK PAIN, LUMBAR, CHRONIC 10/16/2007   REACTION, ACUTE STRESS W/EMOTIONAL DSTURB 08/01/2007   Prediabetes 08/01/2007   Hypertension 08/01/2007   Goiter 07/11/2007   POLYCYSTIC OVARIAN DISEASE 05/24/2007   HIRSUTISM 05/24/2007   Arthropathy, multiple sites 05/24/2007   Past Medical History:  Diagnosis Date   Arthritis    Depression    Fibromyalgia    Headache(784.0)    Hx of adenomatous polyp of colon 02/14/2020   Hypertension    Immune deficiency disorder ([place])    Migraine headache    Shingles    Stroke ([place])    Thrombocytopenia ([place]) 10/01/2012   Past Surgical History:  Procedure Laterality Date   ABDOMINAL HYSTERECTOMY     CARPAL TUNNEL RELEASE Left 09/25/2018   Procedure: LEFT CARPAL TUNNEL RELEASE;  Surgeon: Daryll Brod, MD;  Location: [facility];  Service: Orthopedics;  Laterality: Left;   CESAREAN SECTION  2008   x1   PARTIAL HYSTERECTOMY  2012   due to fibroid   UPPER GASTROINTESTINAL ENDOSCOPY     WISDOM TOOTH EXTRACTION  1995   Social History   Tobacco Use   Smoking status: Never   Smokeless tobacco: Never  Vaping Use   Vaping Use: Never used  Substance Use Topics   Alcohol use: Yes    Comment: occasional   Drug use: Yes    Types: Marijuana    Comment:  smokes small amount twice daily   Family History  Problem Relation Age of Onset   Diabetes Father    COPD Father    Healthy Mother    Breast cancer Paternal Aunt    Colon cancer Paternal Grandmother    Rectal cancer Paternal Grandmother    Lung cancer Paternal Uncle    Lung cancer Paternal Aunt    Esophageal cancer Cousin    Stomach cancer Neg Hx    Allergies  Allergen Reactions   Trazodone  And Nefazodone Nausea And Vomiting   Amoxicillin Other (See Comments)    Yeast infection    Trazodone Nausea And Vomiting   Carafate [Sucralfate] Nausea And Vomiting   Current Outpatient Medications on File Prior to Visit  Medication Sig Dispense Refill   AIMOVIG 140 MG/ML SOAJ Inject into the skin.     ALPRAZolam (XANAX) 1 MG tablet Take 1 mg by mouth 4 (four) times daily as needed for anxiety or sleep.      amitriptyline (ELAVIL) 25 MG tablet Take 2 tablets (50 mg total) by mouth daily. 60 tablet 0   cloNIDine (CATAPRES) 0.2 MG tablet Take 1 tablet (0.2 mg total) by mouth 2 (two) times daily. 1 tablet 0   cyclobenzaprine (FLEXERIL) 10 MG tablet Take 1 tablet (10 mg total) by mouth at bedtime as needed for muscle spasms. 30 tablet 3   EPINEPHrine (EPIPEN 2-PAK) 0.3 mg/0.3 mL IJ SOAJ injection Inject 0.3 mg into the muscle as needed for anaphylaxis. 2 each 1   etonogestrel (NEXPLANON) 68 MG IMPL implant Inject 1 each into the skin continuous. Reported on 01/27/2016     famotidine (PEPCID) 20 MG tablet TAKE 1 TABLET BY MOUTH 2 TIMES DAILY 60 tablet 3   Galcanezumab-gnlm (EMGALITY [place]) Inject into the skin.     meclizine (ANTIVERT) 25 MG tablet Take 50 mg by mouth as needed for dizziness.     ondansetron (ZOFRAN) 8 MG tablet Take 8 mg by mouth 3 (three) times daily.     promethazine (PHENERGAN) 25 MG suppository Place 1 suppository (25 mg total) rectally every 6 (six) hours as needed for nausea or vomiting. 20 each 0   valACYclovir (VALTREX) 500 MG tablet TAKE 1 TABLET BY MOUTH ONCE DAILY 30  tablet 5   No current facility-administered medications on file prior to visit.     Review of Systems  Constitutional:  Positive for fatigue. Negative for activity change, appetite change, fever and unexpected weight change.  HENT:  Negative for congestion, ear pain, rhinorrhea, sinus pressure and sore throat.   Eyes:  Negative for pain, redness and visual disturbance.  Respiratory:  Negative for cough, shortness of breath and wheezing.   Cardiovascular:  Negative for chest pain and palpitations.  Gastrointestinal:  Positive for nausea. Negative for abdominal pain, blood in stool, constipation and diarrhea.  Endocrine: Negative for polydipsia and polyuria.  Genitourinary:  Negative for dysuria, frequency and urgency.  Musculoskeletal:  Negative for arthralgias, back pain and myalgias.  Skin:  Negative for pallor and rash.  Allergic/Immunologic: Negative for environmental allergies.  Neurological:  Negative for dizziness, syncope and headaches.  Hematological:  Negative for adenopathy. Does not bruise/bleed easily.  Psychiatric/Behavioral:  Negative for decreased concentration and dysphoric mood. The patient is not nervous/anxious.        Objective:   Physical Exam Constitutional:      General: She is not in acute distress.    Appearance: Normal appearance. She is well-developed. She is obese. She is not ill-appearing or diaphoretic.  HENT:     Head: Normocephalic and atraumatic.     Mouth/Throat:     Mouth: Mucous membranes are moist.  Eyes:     Conjunctiva/sclera: Conjunctivae normal.     Pupils: Pupils are equal, round, and reactive to light.  Neck:     Thyroid: No thyromegaly.     Vascular: No carotid bruit or JVD.  Cardiovascular:     Rate and Rhythm: Normal rate and regular rhythm.     Heart  sounds: Normal heart sounds.     No gallop.  Pulmonary:     Effort: Pulmonary effort is normal. No respiratory distress.     Breath sounds: Normal breath sounds. No wheezing or  rales.  Abdominal:     General: There is no distension or abdominal bruit.     Palpations: Abdomen is soft.     Tenderness: There is no abdominal tenderness. There is no guarding or rebound.     Comments: No suprapubic tenderness or fullness    Musculoskeletal:     Cervical back: Normal range of motion and neck supple.     Right lower leg: No edema.     Left lower leg: No edema.  Lymphadenopathy:     Cervical: No cervical adenopathy.  Skin:    General: Skin is warm and dry.     Coloration: Skin is not pale.     Findings: No rash.  Neurological:     Mental Status: She is alert.     Coordination: Coordination normal.     Deep Tendon Reflexes: Reflexes are normal and symmetric. Reflexes normal.  Psychiatric:        Mood and Affect: Mood normal.           Assessment & Plan:   Problem List Items Addressed This Visit       Cardiovascular and Mediastinum   Hypertension - Primary    bp is ok today BP: 118/68   When she is nauseated or vomits and misses pills then it goes up  Takes clonidine 0.2 mg bid  A patch would solve the problem but ? If affordable-urged pt to check with ins and let us know Urged to keep up with fluids  bmet is pending       Migraine, chronic, without aura    While headaches have been improved with amovig and emgality and elavil her nausea episodes still occur   Has had GI and allergy evals   Sent to her neurologist for last notes  Also noted some expanded sella on last CT -will get copy to her neuro  She has zofran and phenergan pr to use prn  The episodic nausea with mild headache is problematic         Genitourinary   AKI (acute kidney injury) ([place])    This occurs with vomiting/dehydration   ua today with some rbc (this is her baseline, seen by urology)  bmet pending Working on fluid intake       Microscopic hematuria    Not gross hematuria  Has been seen by urology with neg w/u this spring         Other   Chronic nausea     Intermittent episodes of sweating/nausea and mild ha Unsure if part of migraine  Sent for neuro notes Working on fluids  Has zofran 8 mg and PR phenergan on hand Rev last ER note Reviewed hospital records, lab results and studies in detail         Elevated serum creatinine    Lab this am  ua is baseline with rbc (for which she sees urology)

## 2022-07-05 NOTE — Assessment & Plan Note (Signed)
bp is ok today BP: 118/68   When she is nauseated or vomits and misses pills then it goes up  Takes clonidine 0.2 mg bid  A patch would solve the problem but ? If affordable-urged pt to check with ins and let us know Urged to keep up with fluids  bmet is pending

## 2022-07-05 NOTE — Patient Instructions (Addendum)
There is a patch version of clonidine (catapress)  Call your insurance co and /or pharmacist to see if covered    I want to get a copy of your last CT to your neurologist  Make appt for your neurology follow up    Avoid excessive caffeine  Drink your fluids  Aim for 64 oz of fluid daily-mostly water   Let's see how labs look from this am

## 2022-07-05 NOTE — Assessment & Plan Note (Signed)
Lab this am  ua is baseline with rbc (for which she sees urology)

## 2022-07-05 NOTE — Assessment & Plan Note (Signed)
Intermittent episodes of sweating/nausea and mild ha Unsure if part of migraine  Sent for neuro notes Working on fluids  Has zofran 8 mg and PR phenergan on hand Rev last ER note Reviewed hospital records, lab results and studies in detail

## 2022-07-05 NOTE — Assessment & Plan Note (Signed)
Not gross hematuria  Has been seen by urology with neg w/u this spring

## 2022-07-05 NOTE — Assessment & Plan Note (Signed)
This occurs with vomiting/dehydration   ua today with some rbc (this is her baseline, seen by urology)  bmet pending Working on fluid intake

## 2022-07-05 NOTE — Assessment & Plan Note (Signed)
While headaches have been improved with amovig and emgality and elavil her nausea episodes still occur   Has had GI and allergy evals   Sent to her neurologist for last notes  Also noted some expanded sella on last CT -will get copy to her neuro  She has zofran and phenergan pr to use prn  The episodic nausea with mild headache is problematic

## 2022-07-07 ENCOUNTER — Telehealth: Payer: Self-pay

## 2022-07-07 NOTE — Progress Notes (Addendum)
Chronic Care Management Pharmacy Assistant   Name: Dana Greer  MRN: [id number] DOB: 1971-02-14  Reason for Encounter: Non-CCM (Hosptial Follow Up)  Medications: Outpatient Encounter Medications as of 07/07/2022  Medication Sig Note   AIMOVIG 140 MG/ML SOAJ Inject into the skin.    ALPRAZolam (XANAX) 1 MG tablet Take 1 mg by mouth 4 (four) times daily as needed for anxiety or sleep.     amitriptyline (ELAVIL) 25 MG tablet Take 2 tablets (50 mg total) by mouth daily.    cloNIDine (CATAPRES) 0.2 MG tablet Take 1 tablet (0.2 mg total) by mouth 2 (two) times daily.    cyclobenzaprine (FLEXERIL) 10 MG tablet Take 1 tablet (10 mg total) by mouth at bedtime as needed for muscle spasms.    EPINEPHrine (EPIPEN 2-PAK) 0.3 mg/0.3 mL IJ SOAJ injection Inject 0.3 mg into the muscle as needed for anaphylaxis.    etonogestrel (NEXPLANON) 68 MG IMPL implant Inject 1 each into the skin continuous. Reported on 01/27/2016 04/06/2020: 01/2020   famotidine (PEPCID) 20 MG tablet TAKE 1 TABLET BY MOUTH 2 TIMES DAILY    Galcanezumab-gnlm (EMGALITY [place]) Inject into the skin. 05/06/2022: No recent dispenses   meclizine (ANTIVERT) 25 MG tablet Take 50 mg by mouth as needed for dizziness. 05/06/2022: No recent dispenses   ondansetron (ZOFRAN) 8 MG tablet Take 8 mg by mouth 3 (three) times daily.    promethazine (PHENERGAN) 25 MG suppository Place 1 suppository (25 mg total) rectally every 6 (six) hours as needed for nausea or vomiting.    valACYclovir (VALTREX) 500 MG tablet TAKE 1 TABLET BY MOUTH ONCE DAILY    No facility-administered encounter medications on file as of 07/07/2022.    Reviewed hospital notes for details of recent visit. Patient has been contacted by Transitions of Care team: No  Admitted to the ED on 06/29/2022. Discharge date was 06/29/2022.  Discharged from [facility].   Discharge diagnosis (Principal Problem): Essential Hypertension Patient was discharged to  Home  Brief summary of hospital course: Pt reports she was concerned because her blood pressure was elevated today.  Pt reports she has vomited 4 times today.  Pt took her medications before coming in and reports she is feeling better now.  Pt worried about her kidneys and her heart.  Pt reports she has had episodes of sweating and vomiting on and off for the past 11 years.  Pt reports she has been seen by allergist, Gi, and primary care.  Pt is suppose to follow up with cardiology for evaluation   Medical Decision Making Pt complains of evaluated blood pressure and vomiting.  Pt reports she has a history of the same.     Amount and/or Complexity of Data Reviewed Independent Historian: caregiver    Details: Family here with pain External Data Reviewed: notes.    Details: Primary care notes reviewed  Labs: ordered. Decision-making details documented in ED Course.    Details: Labs ordered reviewed and interpreted  Pt's bun and creatine have improved from previous labs.   Radiology: ordered and independent interpretation performed. Decision-making details documented in ED Course.    Details: Chest xray are normal     Risk Risk Details: Pt advised to continue her home medications.  Schedule follow up with your Physicain   Medications that remain the same after Hospital Discharge:??  -All other medications will remain the same.    Next CCM appt: Non CCM  Other upcoming appts: 07/14/2022 Mammogram  Charlene Brooke, PharmD notified and will determine if action is needed.

## 2022-07-09 ENCOUNTER — Other Ambulatory Visit: Payer: Self-pay

## 2022-07-09 ENCOUNTER — Emergency Department
Admission: EM | Admit: 2022-07-09 | Discharge: 2022-07-09 | Disposition: A | Discharge status: Home / Self Care | Payer: Medicare Other | Attending: Emergency Medicine | Admitting: Emergency Medicine

## 2022-07-09 DIAGNOSIS — R1013 Epigastric pain: Secondary | ICD-10-CM

## 2022-07-09 DIAGNOSIS — R112 Nausea with vomiting, unspecified: Secondary | ICD-10-CM

## 2022-07-09 DIAGNOSIS — R1084 Generalized abdominal pain: Secondary | ICD-10-CM | POA: Diagnosis not present

## 2022-07-09 DIAGNOSIS — I10 Essential (primary) hypertension: Secondary | ICD-10-CM | POA: Diagnosis not present

## 2022-07-09 DIAGNOSIS — R000 Tachycardia, unspecified: Secondary | ICD-10-CM | POA: Diagnosis not present

## 2022-07-09 DIAGNOSIS — G4489 Other headache syndrome: Secondary | ICD-10-CM | POA: Diagnosis not present

## 2022-07-09 DIAGNOSIS — R0689 Other abnormalities of breathing: Secondary | ICD-10-CM | POA: Diagnosis not present

## 2022-07-09 LAB — COMPREHENSIVE METABOLIC PANEL
ALT: 10 U/L (ref 0–44)
AST: 25 U/L (ref 15–41)
Albumin: 4.3 g/dL (ref 3.5–5.0)
Alkaline Phosphatase: 48 U/L (ref 38–126)
Anion gap: 13 (ref 5–15)
BUN: 14 mg/dL (ref 6–20)
CO2: 19 mmol/L — ABNORMAL LOW (ref 22–32)
Calcium: 9.6 mg/dL (ref 8.9–10.3)
Chloride: 108 mmol/L (ref 98–111)
Creatinine, Ser: 1.14 mg/dL — ABNORMAL HIGH (ref 0.44–1.00)
GFR, Estimated: 58 mL/min — ABNORMAL LOW (ref >60)
Glucose, Bld: 208 mg/dL — ABNORMAL HIGH (ref 70–99)
Potassium: 3.2 mmol/L — ABNORMAL LOW (ref 3.5–5.1)
Sodium: 140 mmol/L (ref 135–145)
Total Bilirubin: 0.8 mg/dL (ref 0.3–1.2)
Total Protein: 7.6 g/dL (ref 6.5–8.1)

## 2022-07-09 LAB — CBC
HCT: 40.4 % (ref 36.0–46.0)
Hemoglobin: 13.4 g/dL (ref 12.0–15.0)
MCH: 32.2 pg (ref 26.0–34.0)
MCHC: 33.2 g/dL (ref 30.0–36.0)
MCV: 97.1 fL (ref 80.0–100.0)
Platelets: 187 10*3/uL (ref 150–400)
RBC: 4.16 MIL/uL (ref 3.87–5.11)
RDW: 13.6 % (ref 11.5–15.5)
WBC: 10.4 10*3/uL (ref 4.0–10.5)
nRBC: 0 % (ref 0.0–0.2)

## 2022-07-09 LAB — URINALYSIS, ROUTINE W REFLEX MICROSCOPIC
Bacteria, UA: NONE SEEN
Bilirubin Urine: NEGATIVE
Glucose, UA: >=500 mg/dL — AB
Ketones, ur: 20 mg/dL — AB
Leukocytes,Ua: NEGATIVE
Nitrite: NEGATIVE
Protein, ur: 30 mg/dL — AB
Specific Gravity, Urine: 1.013 (ref 1.005–1.030)
pH: 6 (ref 5.0–8.0)

## 2022-07-09 LAB — LIPASE, BLOOD: Lipase: 30 U/L (ref 11–51)

## 2022-07-09 MED ORDER — METOCLOPRAMIDE HCL 10 MG PO TABS: 10.0000 mg | ORAL_TABLET | Freq: Four times a day (QID) | ORAL | 0 refills | Status: DC | PRN

## 2022-07-09 MED ORDER — ALUM & MAG HYDROXIDE-SIMETH 200-200-20 MG/5ML PO SUSP
30.0000 mL | Freq: Once | ORAL | Status: AC
  Administered 2022-07-09: 30 mL via ORAL
  Filled 2022-07-09: qty 30

## 2022-07-09 MED ORDER — ONDANSETRON 4 MG PO TBDP
4.0000 mg | ORAL_TABLET | Freq: Once | ORAL | Status: AC | PRN
Start: 2022-07-09 — End: 2022-07-09
  Administered 2022-07-09: 4 mg via ORAL
  Filled 2022-07-09: qty 1

## 2022-07-09 MED ORDER — LACTATED RINGERS IV BOLUS
1000.0000 mL | Freq: Once | INTRAVENOUS | Status: AC
  Administered 2022-07-09: 1000 mL via INTRAVENOUS

## 2022-07-09 MED ORDER — FAMOTIDINE IN NACL 20-0.9 MG/50ML-% IV SOLN
20.0000 mg | Freq: Once | INTRAVENOUS | Status: AC
  Administered 2022-07-09: 20 mg via INTRAVENOUS
  Filled 2022-07-09: qty 50

## 2022-07-09 MED ORDER — DROPERIDOL 2.5 MG/ML IJ SOLN
2.5000 mg | Freq: Once | INTRAMUSCULAR | Status: AC
  Administered 2022-07-09: 2.5 mg via INTRAVENOUS
  Filled 2022-07-09: qty 2

## 2022-07-09 NOTE — ED Provider Notes (Signed)
[facility] Provider Note   Event Date/Time   First MD Initiated Contact with Patient 07/09/22 0945     (approximate) History  Nausea  HPI Dana Greer is a 51 y.o. female with a past medical history of PCOS, anxiety/depression, obesity, chronic migraine, and chronic myofascial pain who presents for abdominal pain, nausea, and vomiting that began this morning.  Patient states that she has had similar symptoms in the past when her blood pressure is too high and EMS found it to be high in transit.  Patient's blood pressure on arrival 170/110.  Patient states that she threw up her blood pressure medicines today.  Patient describes a 10/10 abdominal pain that does not radiate and is associated with nausea/vomiting.  Patient states that this pain has been stable since this morning. ROS: Patient currently denies any vision changes, tinnitus, difficulty speaking, facial droop, sore throat, chest pain, shortness of breath, diarrhea, dysuria, or weakness/numbness/paresthesias in any extremity   Physical Exam  Triage Vital Signs: ED Triage Vitals [07/09/22 0857]  Enc Vitals Group     BP (!) 170/110     Pulse Rate 72     Resp 20     Temp 98.1 F (36.7 C)     Temp Source Oral     SpO2 98 %     Weight 187 lb 6.3 oz (85 kg)     Height '5\' 1"'$  (1.549 m)     Head Circumference      Peak Flow      Pain Score 10     Pain Loc      Pain Edu?      Excl. in [place]?    Most recent vital signs: Vitals:   07/09/22 1200 07/09/22 1413  BP: (!) 155/85 (!) 151/87  Pulse: 62 69  Resp: 17 17  Temp: 97.7 F (36.5 C) 98 F (36.7 C)  SpO2: 99% 98%   General: Awake, oriented x4. CV:  Good peripheral perfusion.  Resp:  Normal effort.  Abd:  No distention.  Other:  Overweight middle-aged African-American female laying in bed in no acute distress ED Results / Procedures / Treatments  Labs (all labs ordered are listed, but only abnormal results are displayed) Labs Reviewed   COMPREHENSIVE METABOLIC PANEL - Abnormal; Notable for the following components:      Result Value   Potassium 3.2 (*)    CO2 19 (*)    Glucose, Bld 208 (*)    Creatinine, Ser 1.14 (*)    GFR, Estimated 58 (*)    All other components within normal limits  URINALYSIS, ROUTINE W REFLEX MICROSCOPIC - Abnormal; Notable for the following components:   Color, Urine STRAW (*)    APPearance CLEAR (*)    Glucose, UA >=500 (*)    Hgb urine dipstick MODERATE (*)    Ketones, ur 20 (*)    Protein, ur 30 (*)    All other components within normal limits  LIPASE, BLOOD  CBC  POC URINE PREG, ED   EKG ED ECG REPORT I, Naaman Plummer, the attending physician, personally viewed and interpreted this ECG. Date: 07/09/2022 EKG Time: 0901 Rate: 69 Rhythm: normal sinus rhythm QRS Axis: normal Intervals: normal ST/T Wave abnormalities: normal Narrative Interpretation: no evidence of acute ischemia PROCEDURES: Critical Care performed: No .1-3 Lead EKG Interpretation  Performed by: Naaman Plummer, MD Authorized by: Naaman Plummer, MD     Interpretation: normal     ECG rate:  71   ECG rate assessment: normal     Rhythm: sinus rhythm     Ectopy: none     Conduction: normal    MEDICATIONS ORDERED IN ED: Medications  ondansetron (ZOFRAN-ODT) disintegrating tablet 4 mg (4 mg Oral Given 07/09/22 0900)  droperidol (INAPSINE) 2.5 MG/ML injection 2.5 mg (2.5 mg Intravenous Given 07/09/22 1241)  lactated ringers bolus 1,000 mL (0 mLs Intravenous Stopped 07/09/22 1323)  famotidine (PEPCID) IVPB 20 mg premix (0 mg Intravenous Stopped 07/09/22 1323)  alum & mag hydroxide-simeth (MAALOX/MYLANTA) 200-200-20 MG/5ML suspension 30 mL (30 mLs Oral Given 07/09/22 1342)   IMPRESSION / MDM / ASSESSMENT AND PLAN / ED COURSE  I reviewed the triage vital signs and the nursing notes.                             The patient is on the cardiac monitor to evaluate for evidence of arrhythmia and/or significant heart rate  changes. Patient's presentation is most consistent with acute presentation with potential threat to life or bodily function. Patient presents for acute nausea/vomiting The cause of the patients symptoms is not clear, but the patient is overall well appearing and is suspected to have a transient course of illness.  Given History and Exam there does not appear to be an emergent cause of the symptoms such as small bowel obstruction, coronary syndrome, bowel ischemia, DKA, pancreatitis, appendicitis, other acute abdomen or other emergent problem.  Reassessment: After treatment, the patient is feeling much better, tolerating PO fluids, and shows no signs of dehydration.   Disposition: Discharge home with prompt primary care physician follow up in the next 48 hours. Strict return precautions discussed.   FINAL CLINICAL IMPRESSION(S) / ED DIAGNOSES   Final diagnoses:  Nausea and vomiting, unspecified vomiting type  Epigastric pain   Rx / DC Orders   ED Discharge Orders          Ordered    metoCLOPramide (REGLAN) 10 MG tablet  Every 6 hours PRN        07/09/22 1440           Note:  This document was prepared using Dragon voice recognition software and may include unintentional dictation errors.   Naaman Plummer, MD 07/09/22 [phone]

## 2022-07-09 NOTE — ED Triage Notes (Signed)
Pt here via [facility] with nausea and vomiting that started at 0630. Pt states she is also having abd pain as well. Pt flailing around in wheelchair in triage.

## 2022-07-09 NOTE — ED Triage Notes (Signed)
Pt in via EMS from home with c/o NV. Pt was given '4mg'$  zofran IM. EMS reports pt calm and, not heaving until waiting room. Cbg 166

## 2022-07-10 ENCOUNTER — Encounter: Payer: Self-pay | Admitting: Family Medicine

## 2022-07-11 ENCOUNTER — Telehealth: Payer: Self-pay

## 2022-07-11 NOTE — Telephone Encounter (Signed)
     Patient  visit on 9/9  at [facility] ED  Have you been able to follow up with your primary care physician? YES  The patient was or was not able to obtain any needed medicine or equipment. YES  Are there diet recommendations that you are having difficulty following?NA  Patient expresses understanding of discharge instructions and education provided has no other needs at this time.  [place], [facility], Care Management  [phone] [address], [place], [place] [address] Phone: [phone] Email: [email or web address]

## 2022-07-12 ENCOUNTER — Encounter: Payer: Self-pay | Admitting: Family Medicine

## 2022-07-13 MED ORDER — CLONIDINE 0.2 MG/24HR TD PTWK: 0.2000 mg | MEDICATED_PATCH | TRANSDERMAL | 5 refills | Status: DC

## 2022-07-13 NOTE — Telephone Encounter (Signed)
Stop the oral and start the patch  Let me know if any side effects or problems and watch bp  Follow up with me please in 1-2 weeks in office

## 2022-07-14 ENCOUNTER — Ambulatory Visit
Admission: RE | Admit: 2022-07-14 | Discharge: 2022-07-14 | Disposition: A | Discharge status: Home / Self Care | Payer: Medicare Other | Source: Ambulatory Visit | Attending: Family Medicine | Admitting: Family Medicine

## 2022-07-14 DIAGNOSIS — Z1231 Encounter for screening mammogram for malignant neoplasm of breast: Secondary | ICD-10-CM

## 2022-07-15 ENCOUNTER — Other Ambulatory Visit: Payer: Self-pay | Admitting: Family Medicine

## 2022-07-15 DIAGNOSIS — R928 Other abnormal and inconclusive findings on diagnostic imaging of breast: Secondary | ICD-10-CM

## 2022-07-18 ENCOUNTER — Encounter: Payer: Self-pay | Admitting: Family Medicine

## 2022-07-29 ENCOUNTER — Ambulatory Visit
Admission: RE | Admit: 2022-07-29 | Discharge: 2022-07-29 | Disposition: A | Discharge status: Home / Self Care | Payer: Medicare Other | Source: Ambulatory Visit | Attending: Family Medicine | Admitting: Family Medicine

## 2022-07-29 ENCOUNTER — Other Ambulatory Visit: Payer: Self-pay | Admitting: Family Medicine

## 2022-07-29 DIAGNOSIS — R928 Other abnormal and inconclusive findings on diagnostic imaging of breast: Secondary | ICD-10-CM

## 2022-07-29 DIAGNOSIS — N6320 Unspecified lump in the left breast, unspecified quadrant: Secondary | ICD-10-CM

## 2022-07-29 DIAGNOSIS — N6489 Other specified disorders of breast: Secondary | ICD-10-CM | POA: Diagnosis not present

## 2022-08-09 ENCOUNTER — Encounter: Payer: Self-pay | Admitting: Family Medicine

## 2022-08-12 ENCOUNTER — Encounter: Payer: Self-pay | Admitting: Family Medicine

## 2022-08-15 ENCOUNTER — Ambulatory Visit (INDEPENDENT_AMBULATORY_CARE_PROVIDER_SITE_OTHER): Payer: Medicare Other | Admitting: *Deleted

## 2022-08-15 ENCOUNTER — Other Ambulatory Visit: Payer: Self-pay | Admitting: Family Medicine

## 2022-08-15 DIAGNOSIS — Z0000 Encounter for general adult medical examination without abnormal findings: Secondary | ICD-10-CM | POA: Diagnosis not present

## 2022-08-15 NOTE — Patient Instructions (Signed)
Dana Greer , Thank you for taking time to come for your Medicare Wellness Visit. I appreciate your ongoing commitment to your health goals. Please review the following plan we discussed and let me know if I can assist you in the future.   These are the goals we discussed:  Goals      Increase physical activity     Starting 04/10/2019, I will continue to exercise for 30 minutes daily.         This is a list of the screening recommended for you and due dates:  Health Maintenance  Topic Date Due   Hepatitis C Screening: USPSTF Recommendation to screen - Ages 42-79 yo.  Never done   Zoster (Shingles) Vaccine (1 of 2) Never done   Flu Shot  05/31/2022   COVID-19 Vaccine (3 - Pfizer risk series) 08/31/2022*   Tetanus Vaccine  10/21/2026*   Mammogram  07/15/2023   Colon Cancer Screening  02/10/2027   HIV Screening  Completed   HPV Vaccine  Aged Out  *Topic was postponed. The date shown is not the original due date.    Advanced directives: Education provided    Preventive Care 40-64 Years, Female Preventive care refers to lifestyle choices and visits with your health care provider that can promote health and wellness. What does preventive care include? A yearly physical exam. This is also called an annual well check. Dental exams once or twice a year. Routine eye exams. Ask your health care provider how often you should have your eyes checked. Personal lifestyle choices, including: Daily care of your teeth and gums. Regular physical activity. Eating a healthy diet. Avoiding tobacco and drug use. Limiting alcohol use. Practicing safe sex. Taking low-dose aspirin daily starting at age 54. Taking vitamin and mineral supplements as recommended by your health care provider. What happens during an annual well check? The services and screenings done by your health care provider during your annual well check will depend on your age, overall health, lifestyle risk factors, and family  history of disease. Counseling  Your health care provider may ask you questions about your: Alcohol use. Tobacco use. Drug use. Emotional well-being. Home and relationship well-being. Sexual activity. Eating habits. Work and work Statistician. Method of birth control. Menstrual cycle. Pregnancy history. Screening  You may have the following tests or measurements: Height, weight, and BMI. Blood pressure. Lipid and cholesterol levels. These may be checked every 5 years, or more frequently if you are over 30 years old. Skin check. Lung cancer screening. You may have this screening every year starting at age 33 if you have a 30-pack-year history of smoking and currently smoke or have quit within the past 15 years. Fecal occult blood test (FOBT) of the stool. You may have this test every year starting at age 8. Flexible sigmoidoscopy or colonoscopy. You may have a sigmoidoscopy every 5 years or a colonoscopy every 10 years starting at age 84. Hepatitis C blood test. Hepatitis B blood test. Sexually transmitted disease (STD) testing. Diabetes screening. This is done by checking your blood sugar (glucose) after you have not eaten for a while (fasting). You may have this done every 1-3 years. Mammogram. This may be done every 1-2 years. Talk to your health care provider about when you should start having regular mammograms. This may depend on whether you have a family history of breast cancer. BRCA-related cancer screening. This may be done if you have a family history of breast, ovarian, tubal, or peritoneal cancers.  Pelvic exam and Pap test. This may be done every 3 years starting at age 86. Starting at age 13, this may be done every 5 years if you have a Pap test in combination with an HPV test. Bone density scan. This is done to screen for osteoporosis. You may have this scan if you are at high risk for osteoporosis. Discuss your test results, treatment options, and if necessary, the need  for more tests with your health care provider. Vaccines  Your health care provider may recommend certain vaccines, such as: Influenza vaccine. This is recommended every year. Tetanus, diphtheria, and acellular pertussis (Tdap, Td) vaccine. You may need a Td booster every 10 years. Zoster vaccine. You may need this after age 55. Pneumococcal 13-valent conjugate (PCV13) vaccine. You may need this if you have certain conditions and were not previously vaccinated. Pneumococcal polysaccharide (PPSV23) vaccine. You may need one or two doses if you smoke cigarettes or if you have certain conditions. Talk to your health care provider about which screenings and vaccines you need and how often you need them. This information is not intended to replace advice given to you by your health care provider. Make sure you discuss any questions you have with your health care provider. Document Released: 11/13/2015 Document Revised: 07/06/2016 Document Reviewed: 08/18/2015 Elsevier Interactive Patient Education  2017 [place] Prevention in the Home Falls can cause injuries. They can happen to people of all ages. There are many things you can do to make your home safe and to help prevent falls. What can I do on the outside of my home? Regularly fix the edges of walkways and driveways and fix any cracks. Remove anything that might make you trip as you walk through a door, such as a raised step or threshold. Trim any bushes or trees on the path to your home. Use bright outdoor lighting. Clear any walking paths of anything that might make someone trip, such as rocks or tools. Regularly check to see if handrails are loose or broken. Make sure that both sides of any steps have handrails. Any raised decks and porches should have guardrails on the edges. Have any leaves, snow, or ice cleared regularly. Use sand or salt on walking paths during winter. Clean up any spills in your garage right away. This  includes oil or grease spills. What can I do in the bathroom? Use night lights. Install grab bars by the toilet and in the tub and shower. Do not use towel bars as grab bars. Use non-skid mats or decals in the tub or shower. If you need to sit down in the shower, use a plastic, non-slip stool. Keep the floor dry. Clean up any water that spills on the floor as soon as it happens. Remove soap buildup in the tub or shower regularly. Attach bath mats securely with double-sided non-slip rug tape. Do not have throw rugs and other things on the floor that can make you trip. What can I do in the bedroom? Use night lights. Make sure that you have a light by your bed that is easy to reach. Do not use any sheets or blankets that are too big for your bed. They should not hang down onto the floor. Have a firm chair that has side arms. You can use this for support while you get dressed. Do not have throw rugs and other things on the floor that can make you trip. What can I do in the kitchen?  Clean up any spills right away. Avoid walking on wet floors. Keep items that you use a lot in easy-to-reach places. If you need to reach something above you, use a strong step stool that has a grab bar. Keep electrical cords out of the way. Do not use floor polish or wax that makes floors slippery. If you must use wax, use non-skid floor wax. Do not have throw rugs and other things on the floor that can make you trip. What can I do with my stairs? Do not leave any items on the stairs. Make sure that there are handrails on both sides of the stairs and use them. Fix handrails that are broken or loose. Make sure that handrails are as long as the stairways. Check any carpeting to make sure that it is firmly attached to the stairs. Fix any carpet that is loose or worn. Avoid having throw rugs at the top or bottom of the stairs. If you do have throw rugs, attach them to the floor with carpet tape. Make sure that you  have a light switch at the top of the stairs and the bottom of the stairs. If you do not have them, ask someone to add them for you. What else can I do to help prevent falls? Wear shoes that: Do not have high heels. Have rubber bottoms. Are comfortable and fit you well. Are closed at the toe. Do not wear sandals. If you use a stepladder: Make sure that it is fully opened. Do not climb a closed stepladder. Make sure that both sides of the stepladder are locked into place. Ask someone to hold it for you, if possible. Clearly mark and make sure that you can see: Any grab bars or handrails. First and last steps. Where the edge of each step is. Use tools that help you move around (mobility aids) if they are needed. These include: Canes. Walkers. Scooters. Crutches. Turn on the lights when you go into a dark area. Replace any light bulbs as soon as they burn out. Set up your furniture so you have a clear path. Avoid moving your furniture around. If any of your floors are uneven, fix them. If there are any pets around you, be aware of where they are. Review your medicines with your doctor. Some medicines can make you feel dizzy. This can increase your chance of falling. Ask your doctor what other things that you can do to help prevent falls. This information is not intended to replace advice given to you by your health care provider. Make sure you discuss any questions you have with your health care provider. Document Released: 08/13/2009 Document Revised: 03/24/2016 Document Reviewed: 11/21/2014 Elsevier Interactive Patient Education  2017 [employer].

## 2022-08-15 NOTE — Progress Notes (Signed)
Subjective:   Dana Greer is a 51 y.o. female who presents for Medicare Annual (Subsequent) preventive examination.  I connected with  Dana Greer on 08/15/22 by a telephone enabled telemedicine application and verified that I am speaking with the correct person using two identifiers.   I discussed the limitations of evaluation and management by telemedicine. The patient expressed understanding and agreed to proceed.  Patient location: home  Provider location: Tele-health-home     Review of Systems     Cardiac Risk Factors include: advanced age (>38mn, >>21women);obesity (BMI >30kg/m2);hypertension;sedentary lifestyle     Objective:    Today's Vitals   There is no height or weight on file to calculate BMI.     08/15/2022    1:07 PM 07/09/2022    8:58 AM 06/29/2022    1:57 PM 06/02/2022   10:28 AM 05/06/2022    8:00 AM 05/05/2022    3:35 PM 03/23/2022    1:43 PM  Advanced Directives  Does Patient Have a Medical Advance Directive? No No No No No No No  Would patient like information on creating a medical advance directive? No - Patient declined No - Patient declined No - Patient declined No - Guardian declined No - Patient declined  No - Patient declined    Current Medications (verified) Outpatient Encounter Medications as of 08/15/2022  Medication Sig   AIMOVIG 140 MG/ML SOAJ Inject into the skin.   ALPRAZolam (XANAX) 1 MG tablet Take 1 mg by mouth 4 (four) times daily as needed for anxiety or sleep.    amitriptyline (ELAVIL) 25 MG tablet Take 2 tablets (50 mg total) by mouth daily.   cloNIDine (CATAPRES - DOSED IN MG/24 HR) 0.2 mg/24hr patch Place 1 patch (0.2 mg total) onto the skin once a week.   cyclobenzaprine (FLEXERIL) 10 MG tablet Take 1 tablet (10 mg total) by mouth at bedtime as needed for muscle spasms.   EPINEPHrine (EPIPEN 2-PAK) 0.3 mg/0.3 mL IJ SOAJ injection Inject 0.3 mg into the muscle as needed for anaphylaxis.   etonogestrel (NEXPLANON)  68 MG IMPL implant Inject 1 each into the skin continuous. Reported on 01/27/2016   famotidine (PEPCID) 20 MG tablet TAKE 1 TABLET BY MOUTH 2 TIMES DAILY   Galcanezumab-gnlm (EMGALITY [place]) Inject into the skin.   meclizine (ANTIVERT) 25 MG tablet Take 50 mg by mouth as needed for dizziness.   ondansetron (ZOFRAN) 8 MG tablet Take 8 mg by mouth 3 (three) times daily.   promethazine (PHENERGAN) 25 MG suppository Place 1 suppository (25 mg total) rectally every 6 (six) hours as needed for nausea or vomiting.   valACYclovir (VALTREX) 500 MG tablet TAKE 1 TABLET BY MOUTH ONCE DAILY   metoCLOPramide (REGLAN) 10 MG tablet Take 1 tablet (10 mg total) by mouth every 6 (six) hours as needed for nausea or vomiting.   No facility-administered encounter medications on file as of 08/15/2022.    Allergies (verified) Trazodone and nefazodone, Amoxicillin, Trazodone, and Carafate [sucralfate]   History: Past Medical History:  Diagnosis Date   Arthritis    Depression    Fibromyalgia    Headache(784.0)    Hx of adenomatous polyp of colon 02/14/2020   Hypertension    [place]    Migraine headache    Shingles    [place]    [place]ocyt[place] 10/01/2012   Past Surgical History:  Procedure Laterality Date   ABDOMINAL HYSTERECTOMY     CARPAL TUNNEL RELEASE Left  09/25/2018   Procedure: LEFT CARPAL TUNNEL RELEASE;  Surgeon: Daryll Brod, MD;  Location: [facility];  Service: Orthopedics;  Laterality: Left;   CESAREAN SECTION  2008   x1   PARTIAL HYSTERECTOMY  2012   due to fibroid   UPPER GASTROINTESTINAL ENDOSCOPY     WISDOM TOOTH EXTRACTION  1995   Family History  Problem Relation Age of Onset   Healthy Mother    Diabetes Father    COPD Father    Breast cancer Paternal Aunt    Lung cancer Paternal 21    Lung cancer Paternal Uncle    Breast cancer Paternal Grandmother    Colon cancer Paternal Grandmother    Rectal cancer Paternal Grandmother     Esophageal cancer Cousin    Stomach cancer Neg Hx    Social History   Socioeconomic History   Marital status: Legally Separated    Spouse name: Not on file   Number of children: 3   Years of education: college   Highest education level: Not on file  Occupational History   Occupation: Unemployed  Tobacco Use   Smoking status: Never   Smokeless tobacco: Never  Vaping Use   Vaping Use: Never used  Substance and Sexual Activity   Alcohol use: Yes    Comment: occasional   Drug use: Yes    Types: Marijuana    Comment: smokes small amount twice daily   Sexual activity: Not Currently    Birth control/protection: None  Other Topics Concern   Not on file  Social History Narrative   Lives at home with her children.   6 cups caffeine per day.   Right-handed.   Social Determinants of Health   Financial Resource Strain: High Risk (08/15/2022)   Overall Financial Resource Strain (CARDIA)    Difficulty of Paying Living Expenses: Hard  Food Insecurity: No Food Insecurity (08/15/2022)   Hunger Vital Sign    Worried About Running Out of Food in the Last Year: Never true    Ran Out of Food in the Last Year: Never true  Transportation Needs: No Transportation Needs (08/15/2022)   PRAPARE - Hydrologist (Medical): No    Lack of Transportation (Non-Medical): No  Physical Activity: Inactive (08/15/2022)   Exercise Vital Sign    Days of Exercise per Week: 0 days    Minutes of Exercise per Session: 0 min  Stress: No Stress Concern Present (08/15/2022)   [place]    Feeling of Stress : Only a little  Social Connections: Moderately Isolated (08/15/2022)   Social Connection and Isolation Panel [NHANES]    Frequency of Communication with Friends and Family: More than three times a week    Frequency of Social Gatherings with Friends and Family: Three times a week    Attends Religious Services:  More than 4 times per year    Active Member of Clubs or Organizations: No    Attends Archivist Meetings: Never    Marital Status: Separated    Tobacco Counseling Counseling given: Not Answered   Clinical Intake:  Pre-visit preparation completed: Yes  Pain : No/denies pain     Diabetes: No  How often do you need to have someone help you when you read instructions, pamphlets, or other written materials from your doctor or pharmacy?: 1 - Never  Diabetic? no  Interpreter Needed?: No  Information entered by :: Leroy Kennedy LPN  Activities of Daily Living    08/15/2022    1:15 PM 05/06/2022    8:00 AM  In your present state of health, do you have any difficulty performing the following activities:  Hearing? 0 0  Vision? 0 0  Difficulty concentrating or making decisions? 0 0  Walking or climbing stairs?  0  Dressing or bathing? 0 0  Doing errands, shopping? 0 0  Preparing Food and eating ? N   Using the Toilet? N   In the past six months, have you accidently leaked urine? Y   Do you have problems with loss of bowel control? N   Managing your Medications? N   Managing your Finances? N   Housekeeping or managing your Housekeeping? N     Patient Care Team: [name], Wynelle Fanny, MD as PCP - General (Family Medicine) Marcial Pacas, MD as Attending Physician (Neurology)  Indicate any recent Medical Services you may have received from other than [facility] providers in the past year (date may be approximate).     Assessment:   This is a routine wellness examination for Dana Greer.  Hearing/Vision screen Hearing Screening - Comments:: No trouble hearing Vision Screening - Comments:: Not up to date  Dietary issues and exercise activities discussed: Current Exercise Habits: The patient does not participate in regular exercise at present   Goals Addressed             This Visit's Progress    Increase physical activity       As body will alow       Depression  Screen    08/15/2022    1:14 PM 06/08/2022    9:18 AM 05/16/2022    8:25 AM 07/25/2021    9:11 AM 04/10/2019    9:16 AM 03/21/2018    9:50 AM 10/21/2016    9:30 AM  PHQ 2/9 Scores  PHQ - 2 Score 2 0 0 0 0 5 5  PHQ- 9 Score 8    0 12 12    Fall Risk    08/15/2022    1:07 PM 06/08/2022    9:18 AM 05/16/2022    8:25 AM 07/25/2021    9:15 AM 04/10/2019    9:16 AM  Fall Risk   Falls in the past year?  0 0 1 0  Number falls in past yr: 0   0   Injury with Fall? 0   0   Follow up Education provided;Falls prevention discussed;Falls evaluation completed Falls evaluation completed Falls evaluation completed Falls evaluation completed     FALL RISK PREVENTION PERTAINING TO THE HOME:  Any stairs in or around the home? Yes  If so, are there any without handrails? No  Home free of loose throw rugs in walkways, pet beds, electrical cords, etc? Yes  Adequate lighting in your home to reduce risk of falls? Yes   ASSISTIVE DEVICES UTILIZED TO PREVENT FALLS:  Life alert? No  Use of a cane, walker or w/c? No  Grab bars in the bathroom? Yes  Shower chair or bench in shower? Yes  Elevated toilet seat or a handicapped toilet? Yes   TIMED UP AND GO:  Was the test performed? No .    Cognitive Function:    04/10/2019    9:05 AM 03/21/2018    9:45 AM 10/21/2016    9:35 AM  MMSE - Mini Mental State Exam  Orientation to time '5 5 5  '$ Orientation to Place '5 5 5  '$ Registration 3 3  3  Attention/ Calculation 0 0 0  Recall '3 3 3  '$ Language- name 2 objects 0 0 0  Language- repeat '1 1 1  '$ Language- follow 3 step command 0 3 3  Language- read & follow direction 0 0 0  Write a sentence 0 0 0  Copy design 0 0 0  Total score '17 20 20        '$ 08/15/2022    1:09 PM 07/25/2021    9:17 AM  6CIT Screen  What Year? 0 points 0 points  What month? 0 points 0 points  What time? 0 points 0 points  Count back from 20 0 points 0 points  Months in reverse 0 points 0 points  Repeat phrase 0 points   Total  Score 0 points     Immunizations Immunization History  Administered Date(s) Administered   Influenza,inj,Quad PF,6+ Mos 11/25/2015, 10/25/2016, 01/09/2018   PFIZER(Purple Top)SARS-COV-2 Vaccination 05/27/2020, 06/17/2020    TDAP status: Due, Education has been provided regarding the importance of this vaccine. Advised may receive this vaccine at local pharmacy or Health Dept. Aware to provide a copy of the vaccination record if obtained from local pharmacy or Health Dept. Verbalized acceptance and understanding.  Flu Vaccine status: Due, Education has been provided regarding the importance of this vaccine. Advised may receive this vaccine at local pharmacy or Health Dept. Aware to provide a copy of the vaccination record if obtained from local pharmacy or Health Dept. Verbalized acceptance and understanding.    Covid-19 vaccine status: Information provided on how to obtain vaccines.   Qualifies for Shingles Vaccine? Yes   Zostavax completed No   Shingrix Completed?: Yes  Screening Tests Health Maintenance  Topic Date Due   Hepatitis C Screening  Never done   Zoster Vaccines- Shingrix (1 of 2) Never done   INFLUENZA VACCINE  05/31/2022   COVID-19 Vaccine (3 - Pfizer risk series) 08/31/2022 (Originally 07/15/2020)   TETANUS/TDAP  10/21/2026 (Originally 09/18/1989)   MAMMOGRAM  07/15/2023   COLONOSCOPY (Pts 45-58yr Insurance coverage will need to be confirmed)  02/10/2027   HIV Screening  Completed   HPV VACCINES  Aged Out    Health Maintenance  Health Maintenance Due  Topic Date Due   Hepatitis C Screening  Never done   Zoster Vaccines- Shingrix (1 of 2) Never done   INFLUENZA VACCINE  05/31/2022    Colorectal cancer screening: Type of screening: Colonoscopy. Completed 2021. Repeat every 7 years  Mammogram status: Completed 6 months. Repeat every year    Lung Cancer Screening: (Low Dose CT Chest recommended if Age 52-80years, 30 pack-year currently smoking OR have  quit w/in 15years.) does not qualify.   Lung Cancer Screening Referral:   Additional Screening:  Hepatitis C Screening: does not qualify  Vision Screening: Recommended annual ophthalmology exams for early detection of glaucoma and other disorders of the eye. Is the patient up to date with their annual eye exam?  No  Who is the provider or what is the name of the office in which the patient attends annual eye exams?  If pt is not established with a provider, would they like to be referred to a provider to establish care? No .   Dental Screening: Recommended annual dental exams for proper oral hygiene  Community Resource Referral / Chronic Care Management: CRR required this visit?  No   CCM required this visit?  No      Plan:     I have personally reviewed  and noted the following in the patient's chart:   Medical and social history Use of alcohol, tobacco or illicit drugs  Current medications and supplements including opioid prescriptions. Patient is not currently taking opioid prescriptions. Functional ability and status Nutritional status Physical activity Advanced directives List of other physicians Hospitalizations, surgeries, and ER visits in previous 12 months Vitals Screenings to include cognitive, depression, and falls Referrals and appointments  In addition, I have reviewed and discussed with patient certain preventive protocols, quality metrics, and best practice recommendations. A written personalized care plan for preventive services as well as general preventive health recommendations were provided to patient.     Leroy Kennedy, LPN   00/[id number]/[id number]   Nurse Notes:

## 2022-08-16 MED ORDER — METOCLOPRAMIDE HCL 10 MG PO TABS: 10.0000 mg | ORAL_TABLET | Freq: Four times a day (QID) | ORAL | 0 refills | Status: DC | PRN

## 2022-08-17 ENCOUNTER — Telehealth: Payer: Self-pay | Admitting: *Deleted

## 2022-08-17 NOTE — Telephone Encounter (Signed)
   Telephone encounter was:  Successful.  08/17/2022 Name: Dana Greer MRN: [id number] DOB: [birth year]  Dana Greer is a 51 y.o. year old female who is a primary care patient of [name], Wynelle Fanny, MD . The community resource team was consulted for assistance with Food Insecurity and housing  Patient provided food banks and housing information  Care guide performed the following interventions: Patient provided with information about care guide support team and interviewed to confirm resource needs.  Follow Up Plan:  No further follow up planned at this time. The patient has been provided with needed resources. [facility] [phone] [address] , [place] [address] Email : [email or web address]

## 2022-08-23 ENCOUNTER — Encounter: Payer: Self-pay | Admitting: Family Medicine

## 2022-08-23 NOTE — Telephone Encounter (Signed)
Please schedule an appt to have her breast issues evaluated in person

## 2022-08-24 ENCOUNTER — Ambulatory Visit (INDEPENDENT_AMBULATORY_CARE_PROVIDER_SITE_OTHER): Payer: Medicare Other | Admitting: Family Medicine

## 2022-08-24 ENCOUNTER — Encounter: Payer: Self-pay | Admitting: Family Medicine

## 2022-08-24 VITALS — BP 134/92 | HR 71 | Temp 97.3°F | Ht 61.0 in | Wt 190.0 lb

## 2022-08-24 DIAGNOSIS — R110 Nausea: Secondary | ICD-10-CM

## 2022-08-24 DIAGNOSIS — B029 Zoster without complications: Secondary | ICD-10-CM

## 2022-08-24 MED ORDER — VALACYCLOVIR HCL 1 G PO TABS: 1000.0000 mg | ORAL_TABLET | Freq: Three times a day (TID) | ORAL | 0 refills | Status: DC

## 2022-08-24 NOTE — Patient Instructions (Addendum)
Your skin pain and tingling/itching may be from a past case of shingles   Take the valtrex course as directed  Let us know if it does not help  Let us know if you develop a rash   Exam is reassuring   Get your shingles vaccine when you can after finishing this   Take care of yourself

## 2022-08-24 NOTE — Assessment & Plan Note (Signed)
This has improved lately with prn reglan Disc possible side eff like TD in detail today-trying to minimize use Watching diet and meditating Suspect gastroparesis but gastric empt study was nl in 2021 Will continue to follow

## 2022-08-24 NOTE — Progress Notes (Signed)
Subjective:    Patient ID: Dana Greer, female    DOB: 07-18-1971, 51 y.o.   MRN: [id number]  HPI Pt presents with problem under her breast in setting of past shingles Also for f/u of nausea  Wt Readings from Last 3 Encounters:  08/24/22 190 lb (86.2 kg)  07/09/22 187 lb 6.3 oz (85 kg)  07/05/22 187 lb 8 oz (85 kg)   35.90 kg/m  Under L breast  Sore skin, a bit itchy  Occ tingling on and off  Hurts at times    No rash  Has 2 small moles there   Has scoliosis  Chronic back pain  Pain to lie on the R side    Put moisturizer on it  Tries not to scratch  No rash on her back    She has not had the shingrix shot yet (due for the 2nd)  Had shingles  Was afraid to get it   Has HSV in past  Used to be on suppressive tx   Nausea is improved  Doing some meditation  Watching her diet closely  Rarely takes metoclopramide    Had nl gastric emptying study   Patient Active Problem List   Diagnosis Date Noted   Microscopic hematuria 07/05/2022   Dysphagia 05/16/2022   Rhabdomyolysis 05/05/2022   Heat exhaustion, unspecified, sequela 05/05/2022   Generalized weakness 05/05/2022   Obesity (BMI 30-39.9)    Rash and nonspecific skin eruption 11/15/2021   Perioral dermatitis 11/02/2021   Mixed incontinence 08/16/2021   Elevated serum creatinine 08/12/2021   Urinary frequency 08/12/2021   Herpes zoster without complication 50/07/3817   Hand pain, left 10/14/2020   Swelling of hand joint, left 10/14/2020   TMJ (temporomandibular joint syndrome) 08/18/2020   Non-intractable cyclical vomiting 29/93/7169   Left-sided back pain 02/25/2020   Hx of adenomatous polyp of colon 02/14/2020   Seasonal allergic rhinitis 01/21/2020   Chronic cough 10/04/2019   Headache, post-traumatic, acute 01/28/2019   Head injury, closed 01/17/2019   Abnormal vaginal bleeding 07/06/2018   Neck pain 06/25/2018   Migraine, chronic, without aura 04/12/2018   B12 deficiency  01/10/2018   Foot pain, bilateral 01/09/2018   Hand paresthesia 01/09/2018   Vitamin D deficiency 01/09/2018   Upper abdominal pain 11/22/2016   AKI (acute kidney injury) ([place]) 11/22/2016   Chronic myofascial pain 10/26/2016   Encounter for screening mammogram for breast cancer 10/25/2016   HSV-2 infection 08/17/2016   Adverse effects of medication 11/25/2015   Vertigo 10/08/2015   Carpal tunnel syndrome 03/16/2015   Chronic nausea 02/17/2015   Gout 12/22/2014   Vertiginous migraine 02/03/2014   Intractable migraine 08/31/2012   Osteoarthritis 05/11/2012   Female bladder prolapse 05/11/2012   ADJ DISORDER WITH MIXED ANXIETY & DEPRESSED MOOD 06/30/2008   HYPERHIDROSIS 01/25/2008   BACK PAIN, LUMBAR, CHRONIC 10/16/2007   REACTION, ACUTE STRESS W/EMOTIONAL DSTURB 08/01/2007   Prediabetes 08/01/2007   Hypertension 08/01/2007   Goiter 07/11/2007   POLYCYSTIC OVARIAN DISEASE 05/24/2007   HIRSUTISM 05/24/2007   Arthropathy, multiple sites 05/24/2007   Past Medical History:  Diagnosis Date   Arthritis    Depression    Fibromyalgia    Headache(784.0)    Hx of adenomatous polyp of colon 02/14/2020   Hypertension    Immune deficiency disorder ([place])    Migraine headache    Shingles    Stroke ([place])    Thrombocytopenia ([place]) 10/01/2012   Past Surgical History:  Procedure Laterality Date   ABDOMINAL  HYSTERECTOMY     CARPAL TUNNEL RELEASE Left 09/25/2018   Procedure: LEFT CARPAL TUNNEL RELEASE;  Surgeon: Daryll Brod, MD;  Location: [facility];  Service: Orthopedics;  Laterality: Left;   CESAREAN SECTION  2008   x1   PARTIAL HYSTERECTOMY  2012   due to fibroid   UPPER GASTROINTESTINAL ENDOSCOPY     WISDOM TOOTH EXTRACTION  1995   Social History   Tobacco Use   Smoking status: Never   Smokeless tobacco: Never  Vaping Use   Vaping Use: Never used  Substance Use Topics   Alcohol use: Yes    Comment: occasional   Drug use: Yes    Types: Marijuana    Comment:  smokes small amount twice daily   Family History  Problem Relation Age of Onset   Healthy Mother    Diabetes Father    COPD Father    Breast cancer Paternal Aunt    Lung cancer Paternal Aunt    Lung cancer Paternal Uncle    Breast cancer Paternal Grandmother    Colon cancer Paternal Grandmother    Rectal cancer Paternal Grandmother    Esophageal cancer Cousin    Stomach cancer Neg Hx    Allergies  Allergen Reactions   Trazodone And Nefazodone Nausea And Vomiting   Amoxicillin Other (See Comments)    Yeast infection    Trazodone Nausea And Vomiting   Carafate [Sucralfate] Nausea And Vomiting   Current Outpatient Medications on File Prior to Visit  Medication Sig Dispense Refill   AIMOVIG 140 MG/ML SOAJ Inject into the skin.     ALPRAZolam (XANAX) 1 MG tablet Take 1 mg by mouth 4 (four) times daily as needed for anxiety or sleep.      amitriptyline (ELAVIL) 25 MG tablet Take 2 tablets (50 mg total) by mouth daily. 60 tablet 0   cloNIDine (CATAPRES - DOSED IN MG/24 HR) 0.2 mg/24hr patch Place 1 patch (0.2 mg total) onto the skin once a week. 4 patch 5   cyclobenzaprine (FLEXERIL) 10 MG tablet Take 1 tablet (10 mg total) by mouth at bedtime as needed for muscle spasms. 30 tablet 3   EPINEPHrine (EPIPEN 2-PAK) 0.3 mg/0.3 mL IJ SOAJ injection Inject 0.3 mg into the muscle as needed for anaphylaxis. 2 each 1   etonogestrel (NEXPLANON) 68 MG IMPL implant Inject 1 each into the skin continuous. Reported on 01/27/2016     famotidine (PEPCID) 20 MG tablet TAKE 1 TABLET BY MOUTH 2 TIMES DAILY 60 tablet 3   Galcanezumab-gnlm (EMGALITY [place]) Inject into the skin.     meclizine (ANTIVERT) 25 MG tablet Take 50 mg by mouth as needed for dizziness.     metoCLOPramide (REGLAN) 10 MG tablet Take 1 tablet (10 mg total) by mouth every 6 (six) hours as needed for nausea or vomiting. 60 tablet 0   ondansetron (ZOFRAN) 8 MG tablet Take 8 mg by mouth 3 (three) times daily.     promethazine (PHENERGAN) 25 MG  suppository Place 1 suppository (25 mg total) rectally every 6 (six) hours as needed for nausea or vomiting. 20 each 0   No current facility-administered medications on file prior to visit.    Review of Systems  Constitutional:  Negative for activity change, appetite change, fatigue, fever and unexpected weight change.  HENT:  Negative for congestion, ear pain, rhinorrhea, sinus pressure and sore throat.   Eyes:  Negative for pain, redness and visual disturbance.  Respiratory:  Negative for cough, shortness  of breath and wheezing.   Cardiovascular:  Negative for chest pain and palpitations.  Gastrointestinal:  Negative for abdominal pain, blood in stool, constipation and diarrhea.  Endocrine: Negative for polydipsia and polyuria.  Genitourinary:  Negative for dysuria, frequency and urgency.  Musculoskeletal:  Negative for arthralgias, back pain and myalgias.  Skin:  Negative for pallor and rash.  Allergic/Immunologic: Negative for environmental allergies.  Neurological:  Negative for dizziness, tremors, syncope and headaches.       Tingling and pain on one side of back and abd  Hematological:  Negative for adenopathy. Does not bruise/bleed easily.  Psychiatric/Behavioral:  Negative for decreased concentration and dysphoric mood. The patient is not nervous/anxious.        Objective:   Physical Exam Constitutional:      General: She is not in acute distress.    Appearance: Normal appearance. She is obese. She is not ill-appearing or diaphoretic.  HENT:     Mouth/Throat:     Mouth: Mucous membranes are moist.  Eyes:     General:        Right eye: No discharge.        Left eye: No discharge.     Conjunctiva/sclera: Conjunctivae normal.     Pupils: Pupils are equal, round, and reactive to light.  Cardiovascular:     Rate and Rhythm: Normal rate and regular rhythm.     Heart sounds: Normal heart sounds.  Pulmonary:     Effort: Pulmonary effort is normal. No respiratory distress.      Breath sounds: Normal breath sounds. No wheezing or rales.  Abdominal:     General: There is no distension.     Palpations: There is no mass.     Tenderness: There is no abdominal tenderness. There is no right CVA tenderness, left CVA tenderness, guarding or rebound.  Genitourinary:    Comments: No M or skin changes in L breast  Musculoskeletal:     Cervical back: Neck supple. No tenderness.  Lymphadenopathy:     Cervical: No cervical adenopathy.  Skin:    Coloration: Skin is not jaundiced or pale.     Findings: No bruising, erythema, lesion or rash.     Comments: No rash  Tenderness under L breast around to mid back on the L (more sensitive than tender)  No swelling or erythema   Neurological:     Mental Status: She is alert.     Sensory: No sensory deficit.     Coordination: Coordination normal.  Psychiatric:        Mood and Affect: Mood normal.           Assessment & Plan:   Problem List Items Addressed This Visit       Other   Chronic nausea    This has improved lately with prn reglan Disc possible side eff like TD in detail today-trying to minimize use Watching diet and meditating Suspect gastroparesis but gastric empt study was nl in 2021 Will continue to follow        Herpes zoster without complication - Primary    Pt has acute on chronic L sided pain/itch/tingle from mid TS to mid abd (under breast)  This seems dermatomal but no rash  Reassuring exam /along with breast exam  Differential also incl pinched nerve from scoliosis  H/o HSV and used to take proph valtrex  Today -will tx with valtrex 1 g tid for 7d Update if not starting to improve in a week or  if worsening  Watch for rash  Consider getting 2nd shingrix vaccine        Relevant Medications   valACYclovir (VALTREX) 1000 MG tablet

## 2022-08-24 NOTE — Assessment & Plan Note (Signed)
Pt has acute on chronic L sided pain/itch/tingle from mid TS to mid abd (under breast)  This seems dermatomal but no rash  Reassuring exam /along with breast exam  Differential also incl pinched nerve from scoliosis  H/o HSV and used to take proph valtrex  Today -will tx with valtrex 1 g tid for 7d Update if not starting to improve in a week or if worsening  Watch for rash  Consider getting 2nd shingrix vaccine

## 2022-09-06 ENCOUNTER — Encounter: Payer: Self-pay | Admitting: Family Medicine

## 2022-09-06 DIAGNOSIS — M4125 Other idiopathic scoliosis, thoracolumbar region: Secondary | ICD-10-CM

## 2022-09-07 DIAGNOSIS — M419 Scoliosis, unspecified: Secondary | ICD-10-CM | POA: Insufficient documentation

## 2022-09-13 DIAGNOSIS — M419 Scoliosis, unspecified: Secondary | ICD-10-CM | POA: Diagnosis not present

## 2022-09-21 DIAGNOSIS — M419 Scoliosis, unspecified: Secondary | ICD-10-CM | POA: Diagnosis not present

## 2022-09-29 ENCOUNTER — Other Ambulatory Visit: Payer: Self-pay | Admitting: Family Medicine

## 2022-09-30 NOTE — Telephone Encounter (Signed)
Last filled on 08/16/22 #60 tabs/ 0 refill, last OV was breast issues on 08/24/22

## 2022-10-04 ENCOUNTER — Other Ambulatory Visit: Payer: Self-pay

## 2022-10-04 ENCOUNTER — Telehealth: Payer: Self-pay | Admitting: Family Medicine

## 2022-10-04 ENCOUNTER — Emergency Department: Payer: Medicare Other

## 2022-10-04 DIAGNOSIS — R079 Chest pain, unspecified: Secondary | ICD-10-CM | POA: Insufficient documentation

## 2022-10-04 DIAGNOSIS — R531 Weakness: Secondary | ICD-10-CM | POA: Insufficient documentation

## 2022-10-04 DIAGNOSIS — R1030 Lower abdominal pain, unspecified: Secondary | ICD-10-CM | POA: Insufficient documentation

## 2022-10-04 DIAGNOSIS — Z5321 Procedure and treatment not carried out due to patient leaving prior to being seen by health care provider: Secondary | ICD-10-CM | POA: Diagnosis not present

## 2022-10-04 DIAGNOSIS — R112 Nausea with vomiting, unspecified: Secondary | ICD-10-CM | POA: Diagnosis not present

## 2022-10-04 LAB — COMPREHENSIVE METABOLIC PANEL
ALT: 14 U/L (ref 0–44)
AST: 31 U/L (ref 15–41)
Albumin: 5 g/dL (ref 3.5–5.0)
Alkaline Phosphatase: 57 U/L (ref 38–126)
Anion gap: 13 (ref 5–15)
BUN: 8 mg/dL (ref 6–20)
CO2: 20 mmol/L — ABNORMAL LOW (ref 22–32)
Calcium: 10.3 mg/dL (ref 8.9–10.3)
Chloride: 102 mmol/L (ref 98–111)
Creatinine, Ser: 0.95 mg/dL (ref 0.44–1.00)
GFR, Estimated: >60 mL/min (ref >60)
Glucose, Bld: 208 mg/dL — ABNORMAL HIGH (ref 70–99)
Potassium: 3.5 mmol/L (ref 3.5–5.1)
Sodium: 135 mmol/L (ref 135–145)
Total Bilirubin: 1.4 mg/dL — ABNORMAL HIGH (ref 0.3–1.2)
Total Protein: 8.8 g/dL — ABNORMAL HIGH (ref 6.5–8.1)

## 2022-10-04 LAB — CBC
HCT: 43.8 % (ref 36.0–46.0)
Hemoglobin: 15.1 g/dL — ABNORMAL HIGH (ref 12.0–15.0)
MCH: 33 pg (ref 26.0–34.0)
MCHC: 34.5 g/dL (ref 30.0–36.0)
MCV: 95.6 fL (ref 80.0–100.0)
Platelets: 199 10*3/uL (ref 150–400)
RBC: 4.58 MIL/uL (ref 3.87–5.11)
RDW: 13.9 % (ref 11.5–15.5)
WBC: 13.8 10*3/uL — ABNORMAL HIGH (ref 4.0–10.5)
nRBC: 0 % (ref 0.0–0.2)

## 2022-10-04 LAB — TROPONIN I (HIGH SENSITIVITY): Troponin I (High Sensitivity): 22 ng/L — ABNORMAL HIGH (ref <18)

## 2022-10-04 LAB — LIPASE, BLOOD: Lipase: 38 U/L (ref 11–51)

## 2022-10-04 NOTE — Telephone Encounter (Signed)
I received mychar message from her mother and it went in her mother's chart instead of hers   Dr. Glori Bickers, this concerns my daughter Dana Greer, she is sick again.    dob [birth year],  phone [phone] , this is an addendum to the previous message   Please give her a call   Thanks

## 2022-10-04 NOTE — ED Triage Notes (Signed)
Pt c/o lower abdominal abdominal pain and mid chest pain radiation to arms along with NV, intermittent diaphoreses, and generalized weakness since 3 am. Denies urinary problems. Endorses last smoking TCH yesterday. States being evaluated d/t similar s/s in the past. In triage pt is having difficulty sitting still.

## 2022-10-05 ENCOUNTER — Other Ambulatory Visit: Payer: Self-pay

## 2022-10-05 ENCOUNTER — Emergency Department (HOSPITAL_BASED_OUTPATIENT_CLINIC_OR_DEPARTMENT_OTHER): Payer: Medicare Other

## 2022-10-05 ENCOUNTER — Encounter (HOSPITAL_BASED_OUTPATIENT_CLINIC_OR_DEPARTMENT_OTHER): Payer: Self-pay

## 2022-10-05 ENCOUNTER — Encounter (HOSPITAL_COMMUNITY): Payer: Self-pay

## 2022-10-05 ENCOUNTER — Inpatient Hospital Stay (HOSPITAL_BASED_OUTPATIENT_CLINIC_OR_DEPARTMENT_OTHER)
Admission: EM | Admit: 2022-10-05 | Discharge: 2022-10-08 | DRG: 683 | Disposition: A | Discharge status: Home / Self Care | Payer: Medicare Other | Attending: Family Medicine | Admitting: Family Medicine

## 2022-10-05 ENCOUNTER — Emergency Department
Admission: EM | Admit: 2022-10-05 | Discharge: 2022-10-05 | Discharge status: Against Medical Advice | Payer: Medicare Other | Attending: Emergency Medicine | Admitting: Emergency Medicine

## 2022-10-05 DIAGNOSIS — R079 Chest pain, unspecified: Secondary | ICD-10-CM | POA: Diagnosis not present

## 2022-10-05 DIAGNOSIS — R778 Other specified abnormalities of plasma proteins: Secondary | ICD-10-CM | POA: Diagnosis not present

## 2022-10-05 DIAGNOSIS — M419 Scoliosis, unspecified: Secondary | ICD-10-CM | POA: Diagnosis present

## 2022-10-05 DIAGNOSIS — A419 Sepsis, unspecified organism: Secondary | ICD-10-CM

## 2022-10-05 DIAGNOSIS — M797 Fibromyalgia: Secondary | ICD-10-CM | POA: Diagnosis not present

## 2022-10-05 DIAGNOSIS — R112 Nausea with vomiting, unspecified: Secondary | ICD-10-CM | POA: Diagnosis not present

## 2022-10-05 DIAGNOSIS — Z6837 Body mass index (BMI) 37.0-37.9, adult: Secondary | ICD-10-CM

## 2022-10-05 DIAGNOSIS — I10 Essential (primary) hypertension: Secondary | ICD-10-CM | POA: Diagnosis not present

## 2022-10-05 DIAGNOSIS — K819 Cholecystitis, unspecified: Secondary | ICD-10-CM | POA: Diagnosis present

## 2022-10-05 DIAGNOSIS — E663 Overweight: Secondary | ICD-10-CM | POA: Diagnosis present

## 2022-10-05 DIAGNOSIS — R1310 Dysphagia, unspecified: Secondary | ICD-10-CM | POA: Diagnosis present

## 2022-10-05 DIAGNOSIS — Z1152 Encounter for screening for COVID-19: Secondary | ICD-10-CM | POA: Diagnosis not present

## 2022-10-05 DIAGNOSIS — Z800 Family history of malignant neoplasm of digestive organs: Secondary | ICD-10-CM

## 2022-10-05 DIAGNOSIS — K589 Irritable bowel syndrome without diarrhea: Secondary | ICD-10-CM | POA: Diagnosis present

## 2022-10-05 DIAGNOSIS — R1011 Right upper quadrant pain: Secondary | ICD-10-CM | POA: Diagnosis not present

## 2022-10-05 DIAGNOSIS — R2232 Localized swelling, mass and lump, left upper limb: Secondary | ICD-10-CM | POA: Diagnosis not present

## 2022-10-05 DIAGNOSIS — Z880 Allergy status to penicillin: Secondary | ICD-10-CM

## 2022-10-05 DIAGNOSIS — F419 Anxiety disorder, unspecified: Secondary | ICD-10-CM | POA: Diagnosis not present

## 2022-10-05 DIAGNOSIS — G43709 Chronic migraine without aura, not intractable, without status migrainosus: Secondary | ICD-10-CM | POA: Diagnosis not present

## 2022-10-05 DIAGNOSIS — Z803 Family history of malignant neoplasm of breast: Secondary | ICD-10-CM

## 2022-10-05 DIAGNOSIS — E872 Acidosis, unspecified: Secondary | ICD-10-CM | POA: Diagnosis not present

## 2022-10-05 DIAGNOSIS — R1030 Lower abdominal pain, unspecified: Secondary | ICD-10-CM | POA: Diagnosis not present

## 2022-10-05 DIAGNOSIS — I160 Hypertensive urgency: Secondary | ICD-10-CM | POA: Diagnosis not present

## 2022-10-05 DIAGNOSIS — E282 Polycystic ovarian syndrome: Secondary | ICD-10-CM | POA: Diagnosis not present

## 2022-10-05 DIAGNOSIS — K828 Other specified diseases of gallbladder: Secondary | ICD-10-CM | POA: Diagnosis not present

## 2022-10-05 DIAGNOSIS — R7989 Other specified abnormal findings of blood chemistry: Secondary | ICD-10-CM | POA: Diagnosis not present

## 2022-10-05 DIAGNOSIS — E860 Dehydration: Secondary | ICD-10-CM | POA: Diagnosis present

## 2022-10-05 DIAGNOSIS — I2489 Other forms of acute ischemic heart disease: Secondary | ICD-10-CM | POA: Diagnosis not present

## 2022-10-05 DIAGNOSIS — Z8673 Personal history of transient ischemic attack (TIA), and cerebral infarction without residual deficits: Secondary | ICD-10-CM

## 2022-10-05 DIAGNOSIS — E876 Hypokalemia: Secondary | ICD-10-CM | POA: Diagnosis not present

## 2022-10-05 DIAGNOSIS — F32A Depression, unspecified: Secondary | ICD-10-CM | POA: Diagnosis present

## 2022-10-05 DIAGNOSIS — R931 Abnormal findings on diagnostic imaging of heart and coronary circulation: Secondary | ICD-10-CM | POA: Diagnosis not present

## 2022-10-05 DIAGNOSIS — G8929 Other chronic pain: Secondary | ICD-10-CM | POA: Diagnosis present

## 2022-10-05 DIAGNOSIS — Z825 Family history of asthma and other chronic lower respiratory diseases: Secondary | ICD-10-CM

## 2022-10-05 DIAGNOSIS — D72829 Elevated white blood cell count, unspecified: Secondary | ICD-10-CM | POA: Diagnosis not present

## 2022-10-05 DIAGNOSIS — Z90711 Acquired absence of uterus with remaining cervical stump: Secondary | ICD-10-CM

## 2022-10-05 DIAGNOSIS — Z79899 Other long term (current) drug therapy: Secondary | ICD-10-CM

## 2022-10-05 DIAGNOSIS — Z833 Family history of diabetes mellitus: Secondary | ICD-10-CM

## 2022-10-05 DIAGNOSIS — Z888 Allergy status to other drugs, medicaments and biological substances status: Secondary | ICD-10-CM

## 2022-10-05 DIAGNOSIS — N179 Acute kidney failure, unspecified: Principal | ICD-10-CM | POA: Diagnosis present

## 2022-10-05 DIAGNOSIS — Z801 Family history of malignant neoplasm of trachea, bronchus and lung: Secondary | ICD-10-CM

## 2022-10-05 DIAGNOSIS — Z791 Long term (current) use of non-steroidal anti-inflammatories (NSAID): Secondary | ICD-10-CM

## 2022-10-05 DIAGNOSIS — Z86010 Personal history of colonic polyps: Secondary | ICD-10-CM

## 2022-10-05 DIAGNOSIS — M7989 Other specified soft tissue disorders: Secondary | ICD-10-CM | POA: Diagnosis not present

## 2022-10-05 LAB — HEPATIC FUNCTION PANEL
ALT: 14 U/L (ref 0–44)
AST: 47 U/L — ABNORMAL HIGH (ref 15–41)
Albumin: 5.3 g/dL — ABNORMAL HIGH (ref 3.5–5.0)
Alkaline Phosphatase: 60 U/L (ref 38–126)
Bilirubin, Direct: 0.2 mg/dL (ref 0.0–0.2)
Indirect Bilirubin: 0.7 mg/dL (ref 0.3–0.9)
Total Bilirubin: 0.9 mg/dL (ref 0.3–1.2)
Total Protein: 9.5 g/dL — ABNORMAL HIGH (ref 6.5–8.1)

## 2022-10-05 LAB — BASIC METABOLIC PANEL
Anion gap: 16 — ABNORMAL HIGH (ref 5–15)
BUN: 16 mg/dL (ref 6–20)
CO2: 19 mmol/L — ABNORMAL LOW (ref 22–32)
Calcium: 10.9 mg/dL — ABNORMAL HIGH (ref 8.9–10.3)
Chloride: 100 mmol/L (ref 98–111)
Creatinine, Ser: 1.73 mg/dL — ABNORMAL HIGH (ref 0.44–1.00)
GFR, Estimated: 35 mL/min — ABNORMAL LOW (ref >60)
Glucose, Bld: 135 mg/dL — ABNORMAL HIGH (ref 70–99)
Potassium: 4 mmol/L (ref 3.5–5.1)
Sodium: 135 mmol/L (ref 135–145)

## 2022-10-05 LAB — CBC
HCT: 50.8 % — ABNORMAL HIGH (ref 36.0–46.0)
Hemoglobin: 17.7 g/dL — ABNORMAL HIGH (ref 12.0–15.0)
MCH: 32.7 pg (ref 26.0–34.0)
MCHC: 34.8 g/dL (ref 30.0–36.0)
MCV: 93.7 fL (ref 80.0–100.0)
Platelets: 184 10*3/uL (ref 150–400)
RBC: 5.42 MIL/uL — ABNORMAL HIGH (ref 3.87–5.11)
RDW: 14.2 % (ref 11.5–15.5)
WBC: 18.5 10*3/uL — ABNORMAL HIGH (ref 4.0–10.5)
nRBC: 0 % (ref 0.0–0.2)

## 2022-10-05 LAB — RESP PANEL BY RT-PCR (FLU A&B, COVID) ARPGX2
Influenza A by PCR: NEGATIVE
Influenza B by PCR: NEGATIVE
SARS Coronavirus 2 by RT PCR: NEGATIVE

## 2022-10-05 LAB — LACTIC ACID, PLASMA
Lactic Acid, Venous: 1.4 mmol/L (ref 0.5–1.9)
Lactic Acid, Venous: 8.3 mmol/L (ref 0.5–1.9)

## 2022-10-05 LAB — TROPONIN I (HIGH SENSITIVITY)
Troponin I (High Sensitivity): 437 ng/L (ref <18)
Troponin I (High Sensitivity): 307 ng/L (ref <18)

## 2022-10-05 LAB — LIPASE, BLOOD: Lipase: 15 U/L (ref 11–51)

## 2022-10-05 MED ORDER — NITROGLYCERIN 0.4 MG SL SUBL
0.4000 mg | SUBLINGUAL_TABLET | SUBLINGUAL | Status: DC | PRN
  Administered 2022-10-05 – 2022-10-06 (×4): 0.4 mg via SUBLINGUAL
  Filled 2022-10-05 (×3): qty 1

## 2022-10-05 MED ORDER — PIPERACILLIN-TAZOBACTAM 3.375 G IVPB 30 MIN
3.3750 g | Freq: Once | INTRAVENOUS | Status: AC
  Administered 2022-10-05: 3.375 g via INTRAVENOUS
  Filled 2022-10-05: qty 50

## 2022-10-05 MED ORDER — AMITRIPTYLINE HCL 25 MG PO TABS
50.0000 mg | ORAL_TABLET | Freq: Every day | ORAL | Status: DC
  Administered 2022-10-06 – 2022-10-07 (×3): 50 mg via ORAL
  Filled 2022-10-05: qty 1
  Filled 2022-10-05: qty 2
  Filled 2022-10-05: qty 1
  Filled 2022-10-05: qty 2

## 2022-10-05 MED ORDER — FENTANYL CITRATE PF 50 MCG/ML IJ SOSY
50.0000 ug | PREFILLED_SYRINGE | INTRAMUSCULAR | Status: DC | PRN
  Administered 2022-10-06: 50 ug via INTRAVENOUS
  Filled 2022-10-05: qty 1

## 2022-10-05 MED ORDER — LACTATED RINGERS IV SOLN: INTRAVENOUS | Status: DC

## 2022-10-05 MED ORDER — HEPARIN SODIUM (PORCINE) 5000 UNIT/ML IJ SOLN
5000.0000 [IU] | Freq: Three times a day (TID) | INTRAMUSCULAR | Status: DC
  Administered 2022-10-05 – 2022-10-08 (×8): 5000 [IU] via SUBCUTANEOUS
  Filled 2022-10-05 (×8): qty 1

## 2022-10-05 MED ORDER — CLONIDINE HCL 0.2 MG/24HR TD PTWK: 0.2000 mg | MEDICATED_PATCH | TRANSDERMAL | Status: DC

## 2022-10-05 MED ORDER — LACTATED RINGERS IV SOLN: INTRAVENOUS | Status: AC

## 2022-10-05 MED ORDER — ACETAMINOPHEN 325 MG PO TABS: 650.0000 mg | ORAL_TABLET | Freq: Four times a day (QID) | ORAL | Status: DC | PRN

## 2022-10-05 MED ORDER — ALPRAZOLAM 0.5 MG PO TABS: 1.0000 mg | ORAL_TABLET | Freq: Four times a day (QID) | ORAL | Status: DC | PRN

## 2022-10-05 MED ORDER — ASPIRIN 325 MG PO TABS
325.0000 mg | ORAL_TABLET | Freq: Every day | ORAL | Status: DC
  Administered 2022-10-05 – 2022-10-08 (×4): 325 mg via ORAL
  Filled 2022-10-05 (×4): qty 1

## 2022-10-05 MED ORDER — ACETAMINOPHEN 650 MG RE SUPP: 650.0000 mg | Freq: Four times a day (QID) | RECTAL | Status: DC | PRN

## 2022-10-05 MED ORDER — LACTATED RINGERS IV BOLUS
1000.0000 mL | Freq: Once | INTRAVENOUS | Status: AC
  Administered 2022-10-05: 1000 mL via INTRAVENOUS

## 2022-10-05 MED ORDER — LACTATED RINGERS IV BOLUS (SEPSIS)
1000.0000 mL | Freq: Once | INTRAVENOUS | Status: AC
  Administered 2022-10-05: 1000 mL via INTRAVENOUS

## 2022-10-05 MED ORDER — MORPHINE SULFATE (PF) 4 MG/ML IV SOLN
4.0000 mg | Freq: Once | INTRAVENOUS | Status: AC
  Administered 2022-10-05: 4 mg via INTRAVENOUS
  Filled 2022-10-05: qty 1

## 2022-10-05 MED ORDER — SODIUM CHLORIDE 0.9% FLUSH
3.0000 mL | Freq: Two times a day (BID) | INTRAVENOUS | Status: DC
  Administered 2022-10-06 – 2022-10-08 (×3): 3 mL via INTRAVENOUS

## 2022-10-05 MED ORDER — PIPERACILLIN-TAZOBACTAM 3.375 G IVPB
3.3750 g | Freq: Three times a day (TID) | INTRAVENOUS | Status: DC
  Administered 2022-10-06 – 2022-10-08 (×7): 3.375 g via INTRAVENOUS
  Filled 2022-10-05 (×9): qty 50

## 2022-10-05 MED ORDER — IOHEXOL 350 MG/ML SOLN
100.0000 mL | Freq: Once | INTRAVENOUS | Status: AC | PRN
  Administered 2022-10-05: 85 mL via INTRAVENOUS

## 2022-10-05 MED ORDER — LABETALOL HCL 5 MG/ML IV SOLN
10.0000 mg | INTRAVENOUS | Status: DC | PRN
  Administered 2022-10-05 – 2022-10-06 (×3): 10 mg via INTRAVENOUS
  Filled 2022-10-05 (×3): qty 4

## 2022-10-05 MED ORDER — FENTANYL CITRATE PF 50 MCG/ML IJ SOSY: 50.0000 ug | PREFILLED_SYRINGE | INTRAMUSCULAR | Status: DC | PRN

## 2022-10-05 MED ORDER — VALACYCLOVIR HCL 500 MG PO TABS
500.0000 mg | ORAL_TABLET | Freq: Every day | ORAL | Status: DC
  Administered 2022-10-06 – 2022-10-08 (×3): 500 mg via ORAL
  Filled 2022-10-05 (×4): qty 1

## 2022-10-05 NOTE — ED Notes (Signed)
Dr. Armandina Gemma is with her as I write this.

## 2022-10-05 NOTE — Progress Notes (Signed)
Pharmacy Antibiotic Note  Marjo Grosvenor King-Goins is a 51 y.o. female admitted on 10/05/2022 with Cholecystitis.  Pharmacy has been consulted for Zosyn dosing.  Patient is in AKI with CrCl 30-40 mL/min  Plan: Zosyn 3.375g Q8H F/u cultures for de-escalation Monitor renal function for dose adjustments  Height: '5\' 1"'$  (154.9 cm) Weight: 86.2 kg (190 lb 0.6 oz) IBW/kg (Calculated) : 47.8  Temp (24hrs), Avg:98.2 F (36.8 C), Min:97.7 F (36.5 C), Max:98.7 F (37.1 C)  Recent Labs  Lab 10/04/22 2155 10/05/22 1423 10/05/22 1748  WBC 13.8* 18.5*  --   CREATININE 0.95 1.73*  --   LATICACIDVEN  --   --  1.4    Estimated Creatinine Clearance: 38 mL/min (A) (by C-G formula based on SCr of 1.73 mg/dL (H)).    Allergies  Allergen Reactions   Trazodone And Nefazodone Nausea And Vomiting   Amoxicillin Other (See Comments)    Yeast infection    Trazodone Nausea And Vomiting   Carafate [Sucralfate] Nausea And Vomiting    Antimicrobials this admission: Zosyn 12/6 >>  Microbiology results: 12/6 Bcx: sent  Thank you for allowing pharmacy to be a part of this patient's care.  Merrilee Jansky, PharmD Clinical Pharmacist 10/05/2022 9:37 PM

## 2022-10-05 NOTE — ED Notes (Signed)
Spoke with Lauren at [facility] for pick up.  ED to ED.  Dr. Vanita Panda is accepting.

## 2022-10-05 NOTE — ED Notes (Signed)
No answer when called several times from lobby 

## 2022-10-05 NOTE — Sepsis Progress Note (Signed)
Elink tracking the Code Sepsis. 

## 2022-10-05 NOTE — Progress Notes (Signed)
Saw pt and reviewed results Pt would benefit from HIDA scan when able. Low suspicion he n/v and pain is GB in origin. Pt with long history of n/v ? Gastroparesis.  Will f/u HIDA when completed. No plans for surgery at this point.

## 2022-10-05 NOTE — ED Notes (Signed)
CRITICAL VALUE STICKER  CRITICAL VALUE:Troponin 570  YIYUWCNP (on-site recipient of call):Shawnie Pons, RN  DATE & TIME NOTIFIED:   MESSENGER (representative from lab):  MD NOTIFIED: Dr. Armandina Gemma  TIME OF NOTIFICATION:1614  RESPONSE:

## 2022-10-05 NOTE — H&P (Signed)
History and Physical    Dana Greer XBJ:[id number] DOB: 1971/03/02 DOA: 10/05/2022  PCP: Abner Greenspan, MD   Patient coming from: Home   Chief Complaint: Chest pain, back pain, abdominal pain, N/V, sweating   HPI: Dana Greer is a 51 y.o. female with medical history significant for hypertension, anxiety, chronic headache, chronic nausea, and fibromyalgia who presents to the emergency department with multiple complaints including chest pain, back pain, abdominal pain, nausea, vomiting, and diaphoresis.  Patient reports becoming diaphoretic when she woke early in the morning of 10/04/2022 to use the bathroom.  Since then, she developed cramping lower abdominal pain that she describes as spreading up into the upper abdomen and lower chest where she has a dull and constant discomfort with occasional sharp component.  This has been associated with nausea and nonbloody vomiting with 4-5 episodes today and more than that yesterday.  She has not been eating since symptom onset.  She states that nitroglycerin in the ED "might of helped some" with the pain. She is unable to identify any other alleviating or exacerbating factors.  She denies fevers, chills, significant cough, shortness of breath, or leg swelling.  [facility] ED Course: Upon arrival to the ED, patient is found to be afebrile and saturating well on room air with elevated heart rate and elevated blood pressure.  EKG demonstrates sinus tachycardia with rate 149 and QTc 513.  Chest x-ray negative for acute cardiopulmonary disease.  CTA chest/abdomen/pelvis is negative for acute aortic findings but notable for gallbladder wall thickening.  Right upper quadrant ultrasound also demonstrates gallbladder wall thickening without stones, Murphy sign, or dilated ducts.  Blood work most notable for creatinine 1.73, WBC 18,500, hemoglobin 17.7, and troponin 437.   ED physician reviewed the case with cardiology who indicated that  this is likely demand ischemia rather than ACS.  Blood cultures were collected and and the patient was treated with aspirin, fentanyl, 2 L LR, morphine, Zofran, nitroglycerin, and Zosyn in the emergency department.  General surgery (Dr. Rosendo Gros) was consulted by the ED physician and the patient was transferred to [facility] for surgical consultation and admission to the hospitalist service.  Review of Systems:  All other systems reviewed and apart from HPI, are negative.  Past Medical History:  Diagnosis Date   Arthritis    Depression    Fibromyalgia    Headache(784.0)    Hx of adenomatous polyp of colon 02/14/2020   Hypertension    Immune deficiency disorder ([place])    Migraine headache    Shingles    Stroke ([place])    Thrombocytopenia ([place]) 10/01/2012    Past Surgical History:  Procedure Laterality Date   ABDOMINAL HYSTERECTOMY     CARPAL TUNNEL RELEASE Left 09/25/2018   Procedure: LEFT CARPAL TUNNEL RELEASE;  Surgeon: Daryll Brod, MD;  Location: [facility];  Service: Orthopedics;  Laterality: Left;   CESAREAN SECTION  2008   x1   PARTIAL HYSTERECTOMY  2012   due to fibroid   [place] EXTRACTION  1995    Social History:   reports that she has never smoked. She has never used smokeless tobacco. She reports current alcohol use. She reports current drug use. Drug: Marijuana.  Allergies  Allergen Reactions   Trazodone And Nefazodone Nausea And Vomiting   Amoxicillin Other (See Comments)    Yeast infection    Trazodone Nausea And Vomiting   Carafate [Sucralfate] Nausea And Vomiting  Family History  Problem Relation Age of Onset   Healthy Mother    Diabetes Father    COPD Father    Breast cancer Paternal Aunt    Lung cancer Paternal Aunt    Lung cancer Paternal Uncle    Breast cancer Paternal Grandmother    Colon cancer Paternal Grandmother    Rectal cancer Paternal Grandmother    Esophageal cancer Cousin     Stomach cancer Neg Hx      Prior to Admission medications   Medication Sig Start Date End Date Taking? Authorizing Provider  AIMOVIG 140 MG/ML SOAJ Inject into the skin. 12/22/21   [provider]  ALPRAZolam Duanne Moron) 1 MG tablet Take 1 mg by mouth 4 (four) times daily as needed for anxiety or sleep.     [provider]  amitriptyline (ELAVIL) 25 MG tablet Take 2 tablets (50 mg total) by mouth daily. 05/18/20   Kayleen Memos, DO  amitriptyline (ELAVIL) 50 MG tablet Take by mouth. 09/29/22   [provider]  cloNIDine (CATAPRES - DOSED IN MG/24 HR) 0.2 mg/24hr patch Place 1 patch (0.2 mg total) onto the skin once a week. 07/13/22   [name], Wynelle Fanny, MD  cyclobenzaprine (FLEXERIL) 10 MG tablet Take 1 tablet (10 mg total) by mouth at bedtime as needed for muscle spasms. 10/05/21   [name], Wynelle Fanny, MD  EPINEPHrine (EPIPEN 2-PAK) 0.3 mg/0.3 mL IJ SOAJ injection Inject 0.3 mg into the muscle as needed for anaphylaxis. 12/30/21   Kennith Gain, MD  etonogestrel (NEXPLANON) 68 MG IMPL implant Inject 1 each into the skin continuous. Reported on 01/27/2016    [provider]  famotidine (PEPCID) 20 MG tablet TAKE 1 TABLET BY MOUTH 2 TIMES DAILY 01/25/22   [name], Wynelle Fanny, MD  Galcanezumab-gnlm [facility]) Inject into the skin.    [provider]  meclizine (ANTIVERT) 25 MG tablet Take 50 mg by mouth as needed for dizziness.    [provider]  meloxicam (MOBIC) 7.5 MG tablet Take 7.5 mg by mouth 2 (two) times daily. 09/21/22   [provider]  methocarbamol (ROBAXIN) 500 MG tablet Take 500 mg by mouth every 8 (eight) hours as needed. 10/03/22   [provider]  metoCLOPramide (REGLAN) 10 MG tablet TAKE ONE TABLET BY MOUTH EVERY 6 HOURS AS NEEDED FOR NAUSEA OR VOMITING 09/30/22   [name], Wynelle Fanny, MD  ondansetron (ZOFRAN) 8 MG tablet Take 8 mg by mouth 3 (three) times daily. 06/24/21   [provider]  promethazine (PHENERGAN) 25  MG suppository Place 1 suppository (25 mg total) rectally every 6 (six) hours as needed for nausea or vomiting. 04/26/22   [name], Wynelle Fanny, MD  valACYclovir (VALTREX) 1000 MG tablet Take 1 tablet (1,000 mg total) by mouth 3 (three) times daily. 08/24/22   [name], Wynelle Fanny, MD  valACYclovir (VALTREX) 500 MG tablet Take 500 mg by mouth daily. 09/29/22   [provider]    Physical Exam: Vitals:   10/05/22 2038 10/05/22 2039 10/05/22 2045 10/05/22 2100  BP: (!) 141/119  (!) 149/97 (!) 146/112  Pulse: (!) 101  96 92  Resp:   13 (!) 32  Temp:  98.7 F (37.1 C)    TempSrc: Oral Oral    SpO2: 98%  97% 97%  Weight:      Height:         Constitutional: NAD, no pallor or diaphoresis   Eyes: PERTLA, lids and conjunctivae normal ENMT: Mucous  membranes are moist. Posterior pharynx clear of any exudate or lesions.   Neck: supple, no masses  Respiratory: no wheezing, no crackles. No accessory muscle use.  Cardiovascular: S1 & S2 heard, regular rate and rhythm. No extremity edema.   Abdomen: No distension, soft, tender in epigastrium and RUQ. Bowel sounds active.  Musculoskeletal: no clubbing / cyanosis. No joint deformity upper and lower extremities.   Skin: no significant rashes, lesions, ulcers. Warm, dry, well-perfused. Neurologic: CN 2-12 grossly intact. Moving all extremities. Alert and oriented.  Psychiatric: Pleasant. Cooperative.    Labs and Imaging on Admission: I have personally reviewed following labs and imaging studies  CBC: Recent Labs  Lab 10/04/22 2155 10/05/22 1423  WBC 13.8* 18.5*  HGB 15.1* 17.7*  HCT 43.8 50.8*  MCV 95.6 93.7  PLT 199 884   Basic Metabolic Panel: Recent Labs  Lab 10/04/22 2155 10/05/22 1423  NA 135 135  K 3.5 4.0  CL 102 100  CO2 20* 19*  GLUCOSE 208* 135*  BUN 8 16  CREATININE 0.95 1.73*  CALCIUM 10.3 10.9*   GFR: Estimated Creatinine Clearance: 38 mL/min (A) (by C-G formula based on SCr of 1.73 mg/dL (H)). Liver Function  Tests: Recent Labs  Lab 10/04/22 2155 10/05/22 1423  AST 31 47*  ALT 14 14  ALKPHOS 57 60  BILITOT 1.4* 0.9  PROT 8.8* 9.5*  ALBUMIN 5.0 5.3*   Recent Labs  Lab 10/04/22 2155 10/05/22 1801  LIPASE 38 15   No results for input(s): "AMMONIA" in the last 168 hours. Coagulation Profile: No results for input(s): "INR", "PROTIME" in the last 168 hours. Cardiac Enzymes: No results for input(s): "CKTOTAL", "CKMB", "CKMBINDEX", "TROPONINI" in the last 168 hours. BNP (last 3 results) No results for input(s): "PROBNP" in the last 8760 hours. HbA1C: No results for input(s): "HGBA1C" in the last 72 hours. CBG: No results for input(s): "GLUCAP" in the last 168 hours. Lipid Profile: No results for input(s): "CHOL", "HDL", "LDLCALC", "TRIG", "CHOLHDL", "LDLDIRECT" in the last 72 hours. Thyroid Function Tests: No results for input(s): "TSH", "T4TOTAL", "FREET4", "T3FREE", "THYROIDAB" in the last 72 hours. Anemia Panel: No results for input(s): "VITAMINB12", "FOLATE", "FERRITIN", "TIBC", "IRON", "RETICCTPCT" in the last 72 hours. Urine analysis:    Component Value Date/Time   COLORURINE STRAW (A) 07/09/2022 0901   APPEARANCEUR CLEAR (A) 07/09/2022 0901   APPEARANCEUR Clear 02/03/2022 1112   LABSPEC 1.013 07/09/2022 0901   PHURINE 6.0 07/09/2022 0901   GLUCOSEU >=500 (A) 07/09/2022 0901   HGBUR MODERATE (A) 07/09/2022 0901   HGBUR moderate 12/10/2007 1434   BILIRUBINUR NEGATIVE 07/09/2022 0901   BILIRUBINUR Negative 07/05/2022 1010   BILIRUBINUR Negative 02/03/2022 1112   KETONESUR 20 (A) 07/09/2022 0901   PROTEINUR 30 (A) 07/09/2022 0901   UROBILINOGEN 0.2 07/05/2022 1010   UROBILINOGEN 1.0 05/21/2015 0445   NITRITE NEGATIVE 07/09/2022 0901   LEUKOCYTESUR NEGATIVE 07/09/2022 0901   Sepsis Labs: '@LABRCNTIP'$ (procalcitonin:4,lacticidven:4) ) Recent Results (from the past 240 hour(s))  Resp Panel by RT-PCR (Flu A&B, Covid) Anterior Nasal Swab     Status: None   Collection Time:  10/05/22  5:40 PM   Specimen: Anterior Nasal Swab  Result Value Ref Range Status   SARS Coronavirus 2 by RT PCR NEGATIVE NEGATIVE Final    Comment: (NOTE) SARS-CoV-2 target nucleic acids are NOT DETECTED.  The SARS-CoV-2 RNA is generally detectable in upper respiratory specimens during the acute phase of infection. The lowest concentration of SARS-CoV-2 viral copies this assay can detect is  138 copies/mL. A negative result does not preclude SARS-Cov-2 infection and should not be used as the sole basis for treatment or other patient management decisions. A negative result may occur with  improper specimen collection/handling, submission of specimen other than nasopharyngeal swab, presence of viral mutation(s) within the areas targeted by this assay, and inadequate number of viral copies(<138 copies/mL). A negative result must be combined with clinical observations, patient history, and epidemiological information. The expected result is Negative.  Fact Sheet for Patients:  EntrepreneurPulse.com.au  Fact Sheet for Healthcare Providers:  IncredibleEmployment.be  This test is no t yet approved or cleared by the [place] FDA and  has been authorized for detection and/or diagnosis of SARS-CoV-2 by FDA under an Emergency Use Authorization (EUA). This EUA will remain  in effect (meaning this test can be used) for the duration of the COVID-19 declaration under Section 564(b)(1) of the Act, 21 U.S.C.section 360bbb-3(b)(1), unless the authorization is terminated  or revoked sooner.       Influenza A by PCR NEGATIVE NEGATIVE Final   Influenza B by PCR NEGATIVE NEGATIVE Final    Comment: (NOTE) The Xpert Xpress SARS-CoV-2/FLU/RSV plus assay is intended as an aid in the diagnosis of influenza from Nasopharyngeal swab specimens and should not be used as a sole basis for treatment. Nasal washings and aspirates are unacceptable for Xpert Xpress  SARS-CoV-2/FLU/RSV testing.  Fact Sheet for Patients: EntrepreneurPulse.com.au  Fact Sheet for Healthcare Providers: IncredibleEmployment.be  This test is not yet approved or cleared by the [place] FDA and has been authorized for detection and/or diagnosis of SARS-CoV-2 by FDA under an Emergency Use Authorization (EUA). This EUA will remain in effect (meaning this test can be used) for the duration of the COVID-19 declaration under Section 564(b)(1) of the Act, 21 U.S.C. section 360bbb-3(b)(1), unless the authorization is terminated or revoked.  Performed at [facility], [address], [place], [place] [address]      Radiological Exams on Admission: US Abdomen Limited RUQ (LIVER/GB)  Result Date: 10/05/2022 CLINICAL DATA:  Pain right upper quadrant EXAM: ULTRASOUND ABDOMEN LIMITED RIGHT UPPER QUADRANT COMPARISON:  CT done earlier today FINDINGS: Gallbladder: There are no demonstrable gallbladder stones. There is wall thickening in gallbladder measuring up to 7.6 mm. Technologist did not observe any tenderness over the gallbladder. According to the note by the technologist, patient is on pain medications. Gallbladder is not distended. Phrygian cap is seen in the fundus. Common bile duct: Diameter: 4.4 mm Liver: No focal lesion identified. Within normal limits in parenchymal echogenicity. Portal vein is patent on color Doppler imaging with normal direction of blood flow towards the liver. Other: None. IMPRESSION: There is wall thickening in gallbladder which may be due to acute or chronic cholecystitis. There are no demonstrable gallbladder stones. Technologist did not observe any tenderness over the gallbladder during the study. Gallbladder is not distended. There is no dilation of bile ducts. Right upper quadrant sonogram is otherwise unremarkable.  Two 2 Electronically Signed   By: Elmer Picker M.D.   On: 10/05/2022 19:43    CT Angio Chest/Abd/Pel for Dissection W and/or Wo Contrast  Result Date: 10/05/2022 CLINICAL DATA:  Lower abdominal pain and mid chest pain radiating to the arms. Evaluate for acute aortic syndrome. EXAM: CT ANGIOGRAPHY CHEST, ABDOMEN AND PELVIS TECHNIQUE: Non-contrast CT of the chest was initially obtained. Multidetector CT imaging through the chest, abdomen and pelvis was performed using the standard protocol during bolus administration of intravenous contrast. Multiplanar reconstructed images  and MIPs were obtained and reviewed to evaluate the vascular anatomy. RADIATION DOSE REDUCTION: This exam was performed according to the departmental dose-optimization program which includes automated exposure control, adjustment of the mA and/or kV according to patient size and/or use of iterative reconstruction technique. CONTRAST:  81m OMNIPAQUE IOHEXOL 350 MG/ML SOLN COMPARISON:  CT abdomen and pelvis 01/18/2022 FINDINGS: CTA CHEST FINDINGS Cardiovascular: Normal caliber of the thoracic aorta without dissection or intramural hematoma. Thoracic aorta is tortuous due to the scoliosis. Bovine type arch. Great vessels are patent. Heart size is normal. Main pulmonary arteries are patent without large filling defects. Mediastinum/Nodes: Thyroid tissue appears to be diffusely enlarged. No mediastinal or hilar lymph node enlargement. No axillary lymph node enlargement. No gross abnormality to the esophagus. Lungs/Pleura: Trachea and mainstem bronchi are patent. Lungs are clear without airspace disease or consolidation. No large pleural effusions. Musculoskeletal: Dextroscoliosis in the thoracic spine. Disc space narrowing and endplate changes along the left side of the mid thoracic spine related to the scoliosis. No acute bone abnormality. Review of the MIP images confirms the above findings. CTA ABDOMEN AND PELVIS FINDINGS VASCULAR Aorta: Normal caliber aorta without aneurysm, dissection, vasculitis or significant  stenosis. Celiac: Patent without evidence of aneurysm, dissection, vasculitis or significant stenosis. Incidentally, there is an accessory left hepatic artery originating from the left gastric artery. SMA: Patent without evidence of aneurysm, dissection, vasculitis or significant stenosis. Renals: Both renal arteries are patent without evidence of aneurysm, dissection, vasculitis, fibromuscular dysplasia or significant stenosis. IMA: Patent. Inflow: Patent without evidence of aneurysm, dissection, vasculitis or significant stenosis. Veins: No obvious venous abnormality within the limitations of this arterial phase study. Review of the MIP images confirms the above findings. NON-VASCULAR Hepatobiliary: Concern for diffuse gallbladder wall thickening. Cannot exclude gallbladder inflammation or pericholecystic fluid. Gallbladder findings are new since prior examination. No discrete liver lesion. No significant biliary dilatation. Pancreas: Unremarkable. No pancreatic ductal dilatation or surrounding inflammatory changes. Spleen: Normal in size without focal abnormality. Adrenals/Urinary Tract: 1.0 cm low-density nodule in the right adrenal gland measures 6 Hounsfield units on the pre contrast images and suggestive for a benign adenoma. Mild stranding is noted around the left adrenal gland and appears to be new. Some of this could be related to non-opacified venous structures. Normal appearance of both kidneys without hydronephrosis. No suspicious renal lesions. No ureter dilatation. Normal appearance of the urinary bladder. Stomach/Bowel: Stomach is within normal limits. Appendix appears normal. No evidence of bowel wall thickening, distention, or inflammatory changes. Lymphatic: No lymph node enlargement in the abdomen or pelvis. Reproductive: Stable appearance of the uterus. Normal appearance of the left adnexa. Water attenuating structure in the right adnexa likely represents ovarian or adnexal cyst measuring up to  3.1 cm. Other: Negative for free fluid.  Negative for free air. Musculoskeletal: No acute or significant osseous findings. Review of the MIP images confirms the above findings. IMPRESSION: 1. Negative for an aortic dissection or aneurysm. 2. Concern for gallbladder wall thickening. Findings are concerning for acute cholecystitis. Recommend right upper quadrant ultrasound for further characterization. 3. Mild stranding near the left adrenal gland is nonspecific. 4. 3.1 cm right ovarian simple-appearing cyst. Recommend follow-up pelvic ultrasound in 6-12 months. Reference: JACR 2020 Feb;17(2):248-254 5. Thyroid gland enlargement. 6. Scoliosis in the thoracic spine with degenerative changes. 7. Right adrenal adenoma. Electronically Signed   By: AMarkus DaftM.D.   On: 10/05/2022 17:46   DG Chest Port 1 View  Result Date: 10/05/2022 CLINICAL DATA:  Chest pain.  EXAM: PORTABLE CHEST 1 VIEW COMPARISON:  Chest x-ray 10/04/2022. FINDINGS: The heart size and mediastinal contours are within normal limits. Both lungs are clear. No visible pleural effusions or pneumothorax. No acute osseous abnormality. S-shaped thoracolumbar scoliosis. IMPRESSION: No active disease. Electronically Signed   By: Margaretha Sheffield M.D.   On: 10/05/2022 14:58   DG Chest Port 1 View  Result Date: 10/04/2022 CLINICAL DATA:  Chest pain EXAM: PORTABLE CHEST 1 VIEW COMPARISON:  None Available. FINDINGS: Lungs are well expanded, symmetric, and clear. No pneumothorax or pleural effusion. Cardiac size within normal limits. Pulmonary vascularity is normal. Moderate thoracic dextroscoliosis again noted. No acute bone abnormality. IMPRESSION: No active disease. Electronically Signed   By: Fidela Salisbury M.D.   On: 10/04/2022 22:19    EKG: Independently reviewed. Sinus tachycardia, rate 149.   Assessment/Plan   1. Chest pain  - Presents with multiple complaints including chest pain and is found to have troponin of 437 then 307  - EKG with sinus  tachycardia, rate 149; no acute aortic or cardiopulmonary findings on CTA in ED  - ED physician reviewed the case with cardiology who suspects demand ischemia  - Repeat EKG now that rate has slowed, continue cardiac monitoring, control BP, check echo for wall-motion abnormality    ADDENDUM (12/7 03:45): She had another episode of chest pain while at rest in the ED that was associated with increase in HR to 150. Sxs did not significantly improve with NTG x3 but HR normalized after fentanyl. Plan to repeat EKG and troponin, continue cardiac monitoring, check echo.   2. ?cholecystitis  - [place] with wall-thickening but no stone, Murphy sign, or ductal dilatation  - She is afebrile; WBC is 18,500  (at least partly d/t hemoconcentration)  - She does have some chronic nausea and chronic pain with normal gastric emptying study in July 2021  - Appreciate surgery consultation, plan to check HIDA scan, will continue bowel rest, IVF, pain-control, and empiric antibiotic for now    3. AKI  - SCr is 1.73 on admission, up from 0.95 the day prior  - Kidneys appear normal with no hydro on CT in ED  - Likely acute prerenal injury in setting of anorexia and N/V  - Continue IVF hydration, renally-dose medications, monitor    4. Hypertensive urgency  - Continue pain-control and clonidine, use labetalol as-needed   5. Anxiety; chronic headache; fibromyalgia   - Continue Xanax and Elavil   6. Right ovarian cyst  - Noted incidentally on CT in ED  - Outpatient follow-up recommended with [place] in 6-12 mos    DVT prophylaxis: sq heparin  Code Status: Full  Level of Care: Level of care: Telemetry Cardiac Family Communication: None present  Disposition Plan:  Patient is from: Home  Anticipated d/c is to: TBD Anticipated d/c date is: 10/08/22  Patient currently: Pending HIDA scan, echocardiogram  Consults called: Surgery  Admission status: Inpatient     Vianne Bulls, MD [facility]  10/05/2022, 9:35  PM

## 2022-10-05 NOTE — ED Provider Notes (Signed)
Patient seen on arrival to our facility.  I discussed the patient's presentation with the initial physician, Dr. Armandina Gemma, and Dr. Rosendo Gros who will follow as a consulting team.  Patient will continue fluids, antibiotics, anticipate HIDA scan tomorrow, surgical involvement as needed at that point.  No indication for emergent surgical intervention currently.   Carmin Muskrat, MD 10/05/22 2047

## 2022-10-05 NOTE — Telephone Encounter (Signed)
Aware, will watch for correspondence Agree with recommendation

## 2022-10-05 NOTE — ED Triage Notes (Signed)
Patient here POV from Home.  Endorses Diaphoresis and Nausea that began recently.   Was seen at another ED Yesterday PM and LWBS from Waiting.   NAD noted during Triage. A&Ox4. GCS 15. BIB Wheelchair.

## 2022-10-05 NOTE — Telephone Encounter (Signed)
I spoke with pt; pt said for several years having problems with N&V. On 10/04/22 pt vomited 3 - 4 times with blood in vomitus; pt said had clear liquid diet. Pt said she has been continuously sweating;pt described as "profuse sweating" pt said "she smells like hell." Pt last vomited on 10/04/22 at 7:30 PM. Pt said last night her BP was 255/178. Pt cannot find her BP cuff now to reck BP. Now pt said she does not have a fever and is not nauseated but is having mid lower dull abd pain. Pt said she is feeling anxious. Pt said she has taken her meds and difficulty in getting her words out but not unusual after taking her meds. Pt was at [facility] ED on 10/04/22 but left without being seen due to wait time. Per pts labs troponin was 22, BS 209 K 3.2 and WBC 13.8. EKG taken at ED on 10/04/22 has septal infarct that was new since 07/09/22 EKG. I spoke with Dr Glori Bickers who advised pt could call 911 and go to different ED if pt preferred. Pt said she will go to [facility] ED now by car; pts mom is going to take pt; pts mom said it will take about 30 - 45 mins. I called [facility] ED spoke with [facility] nurse in ED to let them be aware of above. Heather voiced understanding and appreciated call. Sending note to Dr Glori Bickers and [employer].

## 2022-10-05 NOTE — ED Notes (Signed)
Chest pain relieved after 1 nitro

## 2022-10-05 NOTE — ED Notes (Signed)
Ambulatory to restroom

## 2022-10-05 NOTE — ED Provider Triage Note (Signed)
Emergency Medicine Provider Triage Evaluation Note  Dana Greer , a 51 y.o. female  was evaluated in triage.  Pt complains of crazy sweating with heart tightness, headache onset yesterday upon waking with nausea and vomiting x 3-4 episodes. Woke up at 4:30am yesterday.  Has been going to [place] and hasn't had any problems like this for months.  Review of Systems  Positive: As above Negative: As above  Physical Exam  LMP 10/16/2019 (Within Days) Comment: had hysterectomy-still has half of her uterus and ovaries Gen:   Awake, no distress   Resp:  Normal effort  MSK:   Moves extremities without difficulty  Other:    Medical Decision Making  Medically screening exam initiated at 2:20 PM.  Appropriate orders placed.  Dana Greer was informed that the remainder of the evaluation will be completed by another provider, this initial triage assessment does not replace that evaluation, and the importance of remaining in the ED until their evaluation is complete.     Tacy Learn, PA-C 10/05/22 1423

## 2022-10-05 NOTE — ED Notes (Signed)
She returns from CT at this time to find her mother and daughter are here to see her. She tells me she is "much more comfortable".

## 2022-10-05 NOTE — Sepsis Progress Note (Signed)
Notified bedside nurse of need to draw lactic acid and blood culture.

## 2022-10-05 NOTE — ED Provider Notes (Signed)
[facility] DEPT Provider Note   CSN: [id number] Arrival date & time: 10/05/22  1338     History  Chief Complaint  Patient presents with   Excessive Sweating    Dana Greer is a 51 y.o. female.  HPI   51 year old female with medical history significant for arthritis, depression, migraine headaches, prior CVA, fibromyalgia, HTN who presents to the emergency department with chest pain, back pain, headache.  The patient states that she has been having chest tightness with a headache for the past 2 to 3 days.  She woke up today with nausea and vomited 3-4 times.  She has been [place] fusilli diaphoretic at home.  She utilizes [place] [facility] outpatient and denies any episodes of chest pain on exertion.  She denies any history of CAD or MI.  She initially presented to an outside ED yesterday but left without being seen.  She continues to endorse nausea, chest discomfort.  She additionally endorses back pain but is unsure if this is chronic or new as she has scoliosis.  She describes the discomfort as more of a pressure sensation rather than a ripping tearing sensation but does feel that some of the pain goes to her back.  Home Medications Prior to Admission medications   Medication Sig Start Date End Date Taking? Authorizing Provider  AIMOVIG 140 MG/ML SOAJ Inject into the skin. 12/22/21   [provider]  ALPRAZolam Duanne Moron) 1 MG tablet Take 1 mg by mouth 4 (four) times daily as needed for anxiety or sleep.     [provider]  amitriptyline (ELAVIL) 25 MG tablet Take 2 tablets (50 mg total) by mouth daily. 05/18/20   Kayleen Memos, DO  amitriptyline (ELAVIL) 50 MG tablet Take by mouth. 09/29/22   [provider]  cloNIDine (CATAPRES - DOSED IN MG/24 HR) 0.2 mg/24hr patch Place 1 patch (0.2 mg total) onto the skin once a week. 07/13/22   [name], Wynelle Fanny, MD  cyclobenzaprine (FLEXERIL) 10 MG tablet Take 1 tablet (10 mg total) by mouth  at bedtime as needed for muscle spasms. 10/05/21   [name], Wynelle Fanny, MD  EPINEPHrine (EPIPEN 2-PAK) 0.3 mg/0.3 mL IJ SOAJ injection Inject 0.3 mg into the muscle as needed for anaphylaxis. 12/30/21   Kennith Gain, MD  etonogestrel (NEXPLANON) 68 MG IMPL implant Inject 1 each into the skin continuous. Reported on 01/27/2016    [provider]  famotidine (PEPCID) 20 MG tablet TAKE 1 TABLET BY MOUTH 2 TIMES DAILY 01/25/22   [name], Wynelle Fanny, MD  Galcanezumab-gnlm [facility]) Inject into the skin.    [provider]  meclizine (ANTIVERT) 25 MG tablet Take 50 mg by mouth as needed for dizziness.    [provider]  meloxicam (MOBIC) 7.5 MG tablet Take 7.5 mg by mouth 2 (two) times daily. 09/21/22   [provider]  methocarbamol (ROBAXIN) 500 MG tablet Take 500 mg by mouth every 8 (eight) hours as needed. 10/03/22   [provider]  metoCLOPramide (REGLAN) 10 MG tablet TAKE ONE TABLET BY MOUTH EVERY 6 HOURS AS NEEDED FOR NAUSEA OR VOMITING 09/30/22   [name], Wynelle Fanny, MD  ondansetron (ZOFRAN) 8 MG tablet Take 8 mg by mouth 3 (three) times daily. 06/24/21   [provider]  promethazine (PHENERGAN) 25 MG suppository Place 1 suppository (25 mg total) rectally every 6 (six) hours as needed for nausea or vomiting. 04/26/22   [name], Wynelle Fanny, MD  valACYclovir (VALTREX) 1000 MG  tablet Take 1 tablet (1,000 mg total) by mouth 3 (three) times daily. 08/24/22   [name], Wynelle Fanny, MD  valACYclovir (VALTREX) 500 MG tablet Take 500 mg by mouth daily. 09/29/22   [provider]      Allergies    Trazodone and nefazodone, Amoxicillin, Trazodone, and Carafate [sucralfate]    Review of Systems   Review of Systems  All other systems reviewed and are negative.   Physical Exam Updated Vital Signs BP (!) 144/115   Pulse 79   Temp 97.7 F (36.5 C) (Oral)   Resp 15   Ht '5\' 1"'$  (1.549 m)   Wt 86.2 kg   LMP 10/16/2019 (Within Days) Comment: had  hysterectomy-still has half of her uterus and ovaries  SpO2 100%   BMI 35.91 kg/m  Physical Exam Vitals and nursing note reviewed.  Constitutional:      General: She is in acute distress.     Appearance: She is well-developed. She is diaphoretic.     Comments: Mildly diaphoretic, in acute distress  HENT:     Head: Normocephalic and atraumatic.  Eyes:     Conjunctiva/sclera: Conjunctivae normal.  Cardiovascular:     Rate and Rhythm: Regular rhythm. Tachycardia present.     Pulses: Normal pulses.  Pulmonary:     Effort: Pulmonary effort is normal. No respiratory distress.     Breath sounds: Normal breath sounds.  Abdominal:     Palpations: Abdomen is soft.     Tenderness: There is no abdominal tenderness.  Musculoskeletal:        General: No swelling.     Cervical back: Neck supple.     Right lower leg: No edema.     Left lower leg: No edema.  Skin:    General: Skin is warm.     Capillary Refill: Capillary refill takes less than 2 seconds.  Neurological:     General: No focal deficit present.     Mental Status: She is alert and oriented to person, place, and time. Mental status is at baseline.  Psychiatric:        Mood and Affect: Mood normal.     ED Results / Procedures / Treatments   Labs (all labs ordered are listed, but only abnormal results are displayed) Labs Reviewed  BASIC METABOLIC PANEL - Abnormal; Notable for the following components:      Result Value   CO2 19 (*)    Glucose, Bld 135 (*)    Creatinine, Ser 1.73 (*)    Calcium 10.9 (*)    GFR, Estimated 35 (*)    Anion gap 16 (*)    All other components within normal limits  CBC - Abnormal; Notable for the following components:   WBC 18.5 (*)    RBC 5.42 (*)    Hemoglobin 17.7 (*)    HCT 50.8 (*)    All other components within normal limits  HEPATIC FUNCTION PANEL - Abnormal; Notable for the following components:   Total Protein 9.5 (*)    Albumin 5.3 (*)    AST 47 (*)    All other components  within normal limits  TROPONIN I (HIGH SENSITIVITY) - Abnormal; Notable for the following components:   Troponin I (High Sensitivity) 437 (*)    All other components within normal limits  TROPONIN I (HIGH SENSITIVITY) - Abnormal; Notable for the following components:   Troponin I (High Sensitivity) 307 (*)    All other components within normal limits  RESP PANEL  BY RT-PCR (FLU A&B, COVID) ARPGX2  CULTURE, BLOOD (ROUTINE X 2)  CULTURE, BLOOD (ROUTINE X 2)  LACTIC ACID, PLASMA  LIPASE, BLOOD  LACTIC ACID, PLASMA    EKG EKG Interpretation  Date/Time:  Wednesday October 05 2022 14:29:04 EST Ventricular Rate:  149 PR Interval:  136 QRS Duration: 60 QT Interval:  326 QTC Calculation: 513 R Axis:   24 Text Interpretation: Sinus tachycardia with Premature atrial complexes Septal infarct (cited on or before 04-Oct-2022) Abnormal ECG When compared with ECG of 04-Oct-2022 21:44, Premature atrial complexes are now Present Vent. rate has increased BY  57 BPM Questionable change in initial forces of Septal leads Nonspecific T wave abnormality now evident in Lateral leads Confirmed by Regan Lemming ([id number]) on 10/05/2022 4:14:41 PM  Radiology US Abdomen Limited RUQ (LIVER/GB)  Result Date: 10/05/2022 CLINICAL DATA:  Pain right upper quadrant EXAM: ULTRASOUND ABDOMEN LIMITED RIGHT UPPER QUADRANT COMPARISON:  CT done earlier today FINDINGS: Gallbladder: There are no demonstrable gallbladder stones. There is wall thickening in gallbladder measuring up to 7.6 mm. Technologist did not observe any tenderness over the gallbladder. According to the note by the technologist, patient is on pain medications. Gallbladder is not distended. Phrygian cap is seen in the fundus. Common bile duct: Diameter: 4.4 mm Liver: No focal lesion identified. Within normal limits in parenchymal echogenicity. Portal vein is patent on color Doppler imaging with normal direction of blood flow towards the liver. Other: None.  IMPRESSION: There is wall thickening in gallbladder which may be due to acute or chronic cholecystitis. There are no demonstrable gallbladder stones. Technologist did not observe any tenderness over the gallbladder during the study. Gallbladder is not distended. There is no dilation of bile ducts. Right upper quadrant sonogram is otherwise unremarkable.  Two 2 Electronically Signed   By: Elmer Picker M.D.   On: 10/05/2022 19:43   CT Angio Chest/Abd/Pel for Dissection W and/or Wo Contrast  Result Date: 10/05/2022 CLINICAL DATA:  Lower abdominal pain and mid chest pain radiating to the arms. Evaluate for acute aortic syndrome. EXAM: CT ANGIOGRAPHY CHEST, ABDOMEN AND PELVIS TECHNIQUE: Non-contrast CT of the chest was initially obtained. Multidetector CT imaging through the chest, abdomen and pelvis was performed using the standard protocol during bolus administration of intravenous contrast. Multiplanar reconstructed images and MIPs were obtained and reviewed to evaluate the vascular anatomy. RADIATION DOSE REDUCTION: This exam was performed according to the departmental dose-optimization program which includes automated exposure control, adjustment of the mA and/or kV according to patient size and/or use of iterative reconstruction technique. CONTRAST:  48m OMNIPAQUE IOHEXOL 350 MG/ML SOLN COMPARISON:  CT abdomen and pelvis 01/18/2022 FINDINGS: CTA CHEST FINDINGS Cardiovascular: Normal caliber of the thoracic aorta without dissection or intramural hematoma. Thoracic aorta is tortuous due to the scoliosis. Bovine type arch. Great vessels are patent. Heart size is normal. Main pulmonary arteries are patent without large filling defects. Mediastinum/Nodes: Thyroid tissue appears to be diffusely enlarged. No mediastinal or hilar lymph node enlargement. No axillary lymph node enlargement. No gross abnormality to the esophagus. Lungs/Pleura: Trachea and mainstem bronchi are patent. Lungs are clear without  airspace disease or consolidation. No large pleural effusions. Musculoskeletal: Dextroscoliosis in the thoracic spine. Disc space narrowing and endplate changes along the left side of the mid thoracic spine related to the scoliosis. No acute bone abnormality. Review of the MIP images confirms the above findings. CTA ABDOMEN AND PELVIS FINDINGS VASCULAR Aorta: Normal caliber aorta without aneurysm, dissection, vasculitis or significant stenosis. Celiac: Patent  without evidence of aneurysm, dissection, vasculitis or significant stenosis. Incidentally, there is an accessory left hepatic artery originating from the left gastric artery. SMA: Patent without evidence of aneurysm, dissection, vasculitis or significant stenosis. Renals: Both renal arteries are patent without evidence of aneurysm, dissection, vasculitis, fibromuscular dysplasia or significant stenosis. IMA: Patent. Inflow: Patent without evidence of aneurysm, dissection, vasculitis or significant stenosis. Veins: No obvious venous abnormality within the limitations of this arterial phase study. Review of the MIP images confirms the above findings. NON-VASCULAR Hepatobiliary: Concern for diffuse gallbladder wall thickening. Cannot exclude gallbladder inflammation or pericholecystic fluid. Gallbladder findings are new since prior examination. No discrete liver lesion. No significant biliary dilatation. Pancreas: Unremarkable. No pancreatic ductal dilatation or surrounding inflammatory changes. Spleen: Normal in size without focal abnormality. Adrenals/Urinary Tract: 1.0 cm low-density nodule in the right adrenal gland measures 6 Hounsfield units on the pre contrast images and suggestive for a benign adenoma. Mild stranding is noted around the left adrenal gland and appears to be new. Some of this could be related to non-opacified venous structures. Normal appearance of both kidneys without hydronephrosis. No suspicious renal lesions. No ureter dilatation.  Normal appearance of the urinary bladder. Stomach/Bowel: Stomach is within normal limits. Appendix appears normal. No evidence of bowel wall thickening, distention, or inflammatory changes. Lymphatic: No lymph node enlargement in the abdomen or pelvis. Reproductive: Stable appearance of the uterus. Normal appearance of the left adnexa. Water attenuating structure in the right adnexa likely represents ovarian or adnexal cyst measuring up to 3.1 cm. Other: Negative for free fluid.  Negative for free air. Musculoskeletal: No acute or significant osseous findings. Review of the MIP images confirms the above findings. IMPRESSION: 1. Negative for an aortic dissection or aneurysm. 2. Concern for gallbladder wall thickening. Findings are concerning for acute cholecystitis. Recommend right upper quadrant ultrasound for further characterization. 3. Mild stranding near the left adrenal gland is nonspecific. 4. 3.1 cm right ovarian simple-appearing cyst. Recommend follow-up pelvic ultrasound in 6-12 months. Reference: JACR 2020 Feb;17(2):248-254 5. Thyroid gland enlargement. 6. Scoliosis in the thoracic spine with degenerative changes. 7. Right adrenal adenoma. Electronically Signed   By: Markus Daft M.D.   On: 10/05/2022 17:46   DG Chest Port 1 View  Result Date: 10/05/2022 CLINICAL DATA:  Chest pain. EXAM: PORTABLE CHEST 1 VIEW COMPARISON:  Chest x-ray 10/04/2022. FINDINGS: The heart size and mediastinal contours are within normal limits. Both lungs are clear. No visible pleural effusions or pneumothorax. No acute osseous abnormality. S-shaped thoracolumbar scoliosis. IMPRESSION: No active disease. Electronically Signed   By: Margaretha Sheffield M.D.   On: 10/05/2022 14:58   DG Chest Port 1 View  Result Date: 10/04/2022 CLINICAL DATA:  Chest pain EXAM: PORTABLE CHEST 1 VIEW COMPARISON:  None Available. FINDINGS: Lungs are well expanded, symmetric, and clear. No pneumothorax or pleural effusion. Cardiac size within normal  limits. Pulmonary vascularity is normal. Moderate thoracic dextroscoliosis again noted. No acute bone abnormality. IMPRESSION: No active disease. Electronically Signed   By: Fidela Salisbury M.D.   On: 10/04/2022 22:19    Procedures Procedures    Medications Ordered in ED Medications  aspirin tablet 325 mg (325 mg Oral Given 10/05/22 1722)  nitroGLYCERIN (NITROSTAT) SL tablet 0.4 mg (0.4 mg Sublingual Given 10/05/22 1725)  lactated ringers infusion ( Intravenous New Bag/Given 10/05/22 1847)  fentaNYL (SUBLIMAZE) injection 50 mcg (has no administration in time range)  lactated ringers bolus 1,000 mL (1,000 mLs Intravenous New Bag/Given 10/05/22 1631)  morphine (PF) 4  MG/ML injection 4 mg (4 mg Intravenous Given 10/05/22 1631)  iohexol (OMNIPAQUE) 350 MG/ML injection 100 mL (85 mLs Intravenous Contrast Given 10/05/22 1702)  piperacillin-tazobactam (ZOSYN) IVPB 3.375 g (3.375 g Intravenous New Bag/Given 10/05/22 1848)  lactated ringers bolus 1,000 mL (1,000 mLs Intravenous New Bag/Given 10/05/22 1846)    ED Course/ Medical Decision Making/ A&P Clinical Course as of 10/05/22 2004  Wed Oct 05, 2022  1615 Troponin I (High Sensitivity)(!!): 437 [JL]  1810 Troponin I (High Sensitivity)(!!): 307 [JL]  1810 WBC(!): 18.5 [JL]  1810 Hemoglobin(!): 17.7 [JL]  1810 CO2(!): 19 [JL]  1810 Anion gap(!): 16 [JL]  1810 Creatinine(!): 1.73 [JL]    Clinical Course User Index [JL] Regan Lemming, MD                           Medical Decision Making Amount and/or Complexity of Data Reviewed Labs: ordered. Decision-making details documented in ED Course. Radiology: ordered.  Risk OTC drugs. Prescription drug management.    51 year old female with medical history significant for arthritis, depression, migraine headaches, prior CVA, fibromyalgia, HTN who presents to the emergency department with chest pain, back pain, headache.  The patient states that she has been having chest tightness with a headache for  the past 2 to 3 days.  She woke up today with nausea and vomited 3-4 times.  She has been [place] fusilli diaphoretic at home.  She utilizes [place] [facility] outpatient and denies any episodes of chest pain on exertion.  She denies any history of CAD or MI.  She initially presented to an outside ED yesterday but left without being seen.  She continues to endorse nausea, chest discomfort.  She additionally endorses back pain but is unsure if this is chronic or new as she has scoliosis.  She describes the discomfort as more of a pressure sensation rather than a ripping tearing sensation but does feel that some of the pain goes to her back.  On arrival, the patient was afebrile, initially not tachycardic pulse 78, subsequently tachycardic with sinus tachycardia noted on cardiac telemetry, heart rates in the 120s to 150s, hypertensive BP 170/150, saturating well on room air, not tachypneic.  Physical exam significant for sinus tachycardia, patient in moderate discomfort from chest discomfort.  Differential diagnosis includes ACS, PE, aortic dissection, Boerhaave syndrome, pneumothorax.  Additionally considered COVID-19 and influenza, myocarditis, pericarditis.  Patient's initial EKG revealed sinus tachycardia, ventricular rate 149, nonspecific T wave changes present, no STEMI.  A chest x-ray was performed which revealed no active disease.  Initial laboratory evaluation concerning for an initial troponin elevated to 437.  Her BMP revealed evidence of an AKI with a serum creatinine of 10.73, CBC with evidence of hemoconcentration with a WBC of 18.5, hemoglobin of 17.7.  The patient did have an anion gap acidosis with a bicarbonate of 19, anion gap of 16.  I reviewed the patient's CT angiogram of the chest abdomen pelvis which was ordered to evaluate for dissection, PE.  The patient CT angiogram revealed no evidence of aortic dissection.  She was administered aspirin, 1 L IV fluid bolus and sublingual  nitroglycerin 0.4 mg with subsequent resolution in her chest discomfort.  She was administered 4 mg of morphine IV as well.   Patient CT angiogram resulted positive for acute cholecystitis. IMPRESSION:  1. Negative for an aortic dissection or aneurysm.  2. Concern for gallbladder wall thickening. Findings are concerning  for acute cholecystitis. Recommend right upper  quadrant ultrasound  for further characterization.  3. Mild stranding near the left adrenal gland is nonspecific.  4. 3.1 cm right ovarian simple-appearing cyst. Recommend follow-up  pelvic ultrasound in 6-12 months.  Reference: JACR 2020 Feb;17(2):248-254  5. Thyroid gland enlargement.  6. Scoliosis in the thoracic spine with degenerative changes.  7. Right adrenal adenoma.   Assessment, the patient has right upper quadrant pain with a positive Murphy sign.  Her chest pain has since resolved.  Suspect likely demand ischemia in the setting of sepsis from acute cholecystitis that she is tachycardic with a leukocytosis.  He has an AKI.  Initial fluid bolus has been ordered.  Lactic acid, lipase, hepatic function panel also ordered and pending in addition to a right upper quadrant ultrasound.  The patient was administered additional narcotics and IV Zosyn was ordered.  Code sepsis called.  [place] with Dr. Rosendo Gros of general surgery who is happy to evaluate the patient if she is to come to [facility], otherwise recommended reconsultation if the patient will end up on the hospitalist service at [facility] based on the bed situation.  Spoke with the hospitalist team at [facility], no current inpatient beds available for progressive admission.  Discussed with [facility] and Dr. Vanita Panda, will transfer the patient ER to ER to [facility] for general surgery consultation and evaluation in the ED prior to admission.    2003 [facility] arrived to transport the patient ER to ER for surgical consultation.  The patient's right upper quadrant ultrasound  revealed gallbladder wall thickening which could be acute or chronic.  Patient had right upper quadrant tenderness on exam and meet SIRS criteria for sepsis.  She is status post fluid resuscitation, pain medicine, IV antibiotics.  We will plan for hospitalist admission following surgical consultation in the ER.   Final Clinical Impression(s) / ED Diagnoses Final diagnoses:  AKI (acute kidney injury) ([place])  Elevated troponin  Sepsis with acute renal failure without septic shock, due to unspecified organism, unspecified acute renal failure type ([place])  Thickening of wall of gallbladder with pericholecystic fluid    Rx / DC Orders ED Discharge Orders     None         Regan Lemming, MD 10/05/22 2004

## 2022-10-05 NOTE — ED Triage Notes (Signed)
Pt arrives as transfer from [place] w/ acute colicystitis. Pt info in chart. Pt given anti biotics, nitro and fluids at facility

## 2022-10-06 ENCOUNTER — Other Ambulatory Visit: Payer: Self-pay

## 2022-10-06 ENCOUNTER — Inpatient Hospital Stay (HOSPITAL_COMMUNITY): Payer: Medicare Other

## 2022-10-06 DIAGNOSIS — R079 Chest pain, unspecified: Secondary | ICD-10-CM

## 2022-10-06 DIAGNOSIS — N179 Acute kidney failure, unspecified: Secondary | ICD-10-CM | POA: Diagnosis not present

## 2022-10-06 DIAGNOSIS — F419 Anxiety disorder, unspecified: Secondary | ICD-10-CM | POA: Diagnosis not present

## 2022-10-06 DIAGNOSIS — R7989 Other specified abnormal findings of blood chemistry: Secondary | ICD-10-CM

## 2022-10-06 DIAGNOSIS — R931 Abnormal findings on diagnostic imaging of heart and coronary circulation: Secondary | ICD-10-CM

## 2022-10-06 DIAGNOSIS — K819 Cholecystitis, unspecified: Secondary | ICD-10-CM | POA: Diagnosis not present

## 2022-10-06 LAB — BASIC METABOLIC PANEL
Anion gap: 11 (ref 5–15)
BUN: 17 mg/dL (ref 6–20)
CO2: 22 mmol/L (ref 22–32)
Calcium: 9.4 mg/dL (ref 8.9–10.3)
Chloride: 106 mmol/L (ref 98–111)
Creatinine, Ser: 1.7 mg/dL — ABNORMAL HIGH (ref 0.44–1.00)
GFR, Estimated: 36 mL/min — ABNORMAL LOW (ref >60)
Glucose, Bld: 83 mg/dL (ref 70–99)
Potassium: 4 mmol/L (ref 3.5–5.1)
Sodium: 139 mmol/L (ref 135–145)

## 2022-10-06 LAB — TROPONIN I (HIGH SENSITIVITY)
Troponin I (High Sensitivity): 125 ng/L (ref <18)
Troponin I (High Sensitivity): 134 ng/L (ref <18)
Troponin I (High Sensitivity): 59 ng/L — ABNORMAL HIGH (ref <18)

## 2022-10-06 LAB — CBC
HCT: 46.6 % — ABNORMAL HIGH (ref 36.0–46.0)
Hemoglobin: 15.7 g/dL — ABNORMAL HIGH (ref 12.0–15.0)
MCH: 33.4 pg (ref 26.0–34.0)
MCHC: 33.7 g/dL (ref 30.0–36.0)
MCV: 99.1 fL (ref 80.0–100.0)
Platelets: 143 10*3/uL — ABNORMAL LOW (ref 150–400)
RBC: 4.7 MIL/uL (ref 3.87–5.11)
RDW: 14.6 % (ref 11.5–15.5)
WBC: 13.3 10*3/uL — ABNORMAL HIGH (ref 4.0–10.5)
nRBC: 0 % (ref 0.0–0.2)

## 2022-10-06 LAB — COMPREHENSIVE METABOLIC PANEL
ALT: 16 U/L (ref 0–44)
AST: 34 U/L (ref 15–41)
Albumin: 3.8 g/dL (ref 3.5–5.0)
Alkaline Phosphatase: 52 U/L (ref 38–126)
Anion gap: 11 (ref 5–15)
BUN: 17 mg/dL (ref 6–20)
CO2: 21 mmol/L — ABNORMAL LOW (ref 22–32)
Calcium: 9.4 mg/dL (ref 8.9–10.3)
Chloride: 106 mmol/L (ref 98–111)
Creatinine, Ser: 1.48 mg/dL — ABNORMAL HIGH (ref 0.44–1.00)
GFR, Estimated: 42 mL/min — ABNORMAL LOW (ref >60)
Glucose, Bld: 144 mg/dL — ABNORMAL HIGH (ref 70–99)
Potassium: 2.9 mmol/L — ABNORMAL LOW (ref 3.5–5.1)
Sodium: 138 mmol/L (ref 135–145)
Total Bilirubin: 1.1 mg/dL (ref 0.3–1.2)
Total Protein: 6.8 g/dL (ref 6.5–8.1)

## 2022-10-06 LAB — ECHOCARDIOGRAM LIMITED
Area-P 1/2: 4.71 cm2
Calc EF: 49.8 %
Height: 61 in
Single Plane A2C EF: 43.7 %
Single Plane A4C EF: 54.7 %
Weight: 3040.58 oz

## 2022-10-06 LAB — LACTIC ACID, PLASMA: Lactic Acid, Venous: 1.3 mmol/L (ref 0.5–1.9)

## 2022-10-06 LAB — MAGNESIUM: Magnesium: 1.8 mg/dL (ref 1.7–2.4)

## 2022-10-06 MED ORDER — POTASSIUM CHLORIDE 10 MEQ/100ML IV SOLN
10.0000 meq | Freq: Once | INTRAVENOUS | Status: AC
  Administered 2022-10-06: 10 meq via INTRAVENOUS
  Filled 2022-10-06: qty 100

## 2022-10-06 MED ORDER — CLONIDINE HCL 0.2 MG PO TABS
0.2000 mg | ORAL_TABLET | Freq: Once | ORAL | Status: AC
  Administered 2022-10-06: 0.2 mg via ORAL
  Filled 2022-10-06: qty 1

## 2022-10-06 MED ORDER — PERFLUTREN LIPID MICROSPHERE
1.0000 mL | INTRAVENOUS | Status: AC | PRN
  Administered 2022-10-06: 4 mL via INTRAVENOUS

## 2022-10-06 MED ORDER — TECHNETIUM TC 99M MEBROFENIN IV KIT
5.4000 | PACK | Freq: Once | INTRAVENOUS | Status: AC | PRN
  Administered 2022-10-06: 5.4 via INTRAVENOUS

## 2022-10-06 MED ORDER — AMLODIPINE BESYLATE 10 MG PO TABS
10.0000 mg | ORAL_TABLET | Freq: Every day | ORAL | Status: DC
  Administered 2022-10-06 – 2022-10-08 (×3): 10 mg via ORAL
  Filled 2022-10-06: qty 2
  Filled 2022-10-06 (×2): qty 1

## 2022-10-06 MED ORDER — POTASSIUM CHLORIDE CRYS ER 20 MEQ PO TBCR
40.0000 meq | EXTENDED_RELEASE_TABLET | Freq: Once | ORAL | Status: AC
  Administered 2022-10-06: 40 meq via ORAL
  Filled 2022-10-06: qty 2

## 2022-10-06 MED ORDER — LACTATED RINGERS IV SOLN: INTRAVENOUS | Status: DC

## 2022-10-06 MED ORDER — ONDANSETRON HCL 4 MG/2ML IJ SOLN: 4.0000 mg | Freq: Once | INTRAMUSCULAR | Status: DC | PRN

## 2022-10-06 MED ORDER — ALUM & MAG HYDROXIDE-SIMETH 200-200-20 MG/5ML PO SUSP
30.0000 mL | Freq: Four times a day (QID) | ORAL | Status: DC | PRN
  Administered 2022-10-06 – 2022-10-07 (×2): 30 mL via ORAL
  Filled 2022-10-06 (×2): qty 30

## 2022-10-06 MED ORDER — METOPROLOL TARTRATE 12.5 MG HALF TABLET
12.5000 mg | ORAL_TABLET | Freq: Two times a day (BID) | ORAL | Status: DC
  Administered 2022-10-06 – 2022-10-08 (×3): 12.5 mg via ORAL
  Filled 2022-10-06 (×4): qty 1

## 2022-10-06 MED ORDER — HYDRALAZINE HCL 25 MG PO TABS
25.0000 mg | ORAL_TABLET | Freq: Three times a day (TID) | ORAL | Status: DC
  Administered 2022-10-06 – 2022-10-07 (×4): 25 mg via ORAL
  Filled 2022-10-06 (×5): qty 1

## 2022-10-06 NOTE — Consult Note (Addendum)
Cardiology Consultation   Patient ID: Dana Greer MRN: [id number]; DOB: [birth year]  Admit date: 10/05/2022 Date of Consult: 10/06/2022  PCP:  Abner Greenspan, MD   [place] Providers Cardiologist:  None        Patient Profile:   Dana Greer is a 51 y.o. female with a hx of HTN, anxiety, migraine, relapsing/remitting nausea, vomiting, and fibromyalgia who is being seen 10/06/2022 for the evaluation of new wall motion abnormalities in the setting of elevated but down-trending troponin at the request of Dr. Bonner Puna.  History of Present Illness:   Dana Greer is a 51 year old female with above noted medical history who presented to ED at [facility] on 12/5 with cramping abdominal pain spreaking into upper abdomen and chest. Patient awoke with these symptoms and said that it was associated with diaphoresis, nausea and vomiting. Today patient describes frequent episodes of emesis multiple days a month over many years (10+). Unfortunately, no clear etiology has ever been determined. It is not clear that she always has chest pain with emesis. Patient denies any recent exertional limitation, chest pain, dyspnea and tells me that she would have no difficulty going up several flights of stairs. She endorses nocturnal chest pain that improves with sitting up but does not have orthopnea. Patient sees a traditional [place] medicine provider and has a patch that she wears for blood pressure management. She is currently without chest pain. Reports intermittent and isolated twinges of pain since arriving to [facility] and says that pain has been the worst when she's swallowing food or water. Patient endorses regular marijuana use but denies tobacco or alcohol use.  History includes GI workup for frequent emesis.   In the ED, patient found with sinus tachycardia. CXR without acute abnormalities. CTA chest/abd/pelvis negative for acute aortic process. Notable for gallbladder  wall thickening. RUQ [place] with gallbladder thickening. No stones, Murphy sign or dilated ducts. Labwork completed was notable for creatinine 1.73, WBC 18.5, HGB 17.7. Troponin elevated at 437, 307. Patient appears to have had a recurrent episode of chest pain in the ED that was non-responsive to NTG. Given concern for gallbladder pathology, patient transferred to [facility]. Upon arrival, she underwent HIDA scan which found prompt uptake and biliary excretion by liver, patent cystic duct and common bile duct.     Past Medical History:  Diagnosis Date   Arthritis    Depression    Fibromyalgia    Headache(784.0)    Hx of adenomatous polyp of colon 02/14/2020   Hypertension    Immune deficiency disorder ([place])    Migraine headache    Shingles    Stroke ([place])    Thrombocytopenia ([place]) 10/01/2012    Past Surgical History:  Procedure Laterality Date   ABDOMINAL HYSTERECTOMY     CARPAL TUNNEL RELEASE Left 09/25/2018   Procedure: LEFT CARPAL TUNNEL RELEASE;  Surgeon: Daryll Brod, MD;  Location: [place];  Service: Orthopedics;  Laterality: Left;   CESAREAN SECTION  2008   x1   PARTIAL HYSTERECTOMY  2012   due to fibroid   UPPER GASTROINTESTINAL ENDOSCOPY     WISDOM TOOTH EXTRACTION  1995     Home Medications:  Prior to Admission medications   Medication Sig Start Date End Date Taking? Authorizing Provider  ALPRAZolam Duanne Moron) 1 MG tablet Take 1 mg by mouth 4 (four) times daily as needed for anxiety or sleep.    Yes [provider]  amitriptyline (ELAVIL) 25 MG tablet  Take 2 tablets (50 mg total) by mouth daily. 05/18/20  Yes Kayleen Memos, DO  cloNIDine (CATAPRES - DOSED IN MG/24 HR) 0.2 mg/24hr patch Place 1 patch (0.2 mg total) onto the skin once a week. 07/13/22  Yes [name], Wynelle Fanny, MD  cyclobenzaprine (FLEXERIL) 10 MG tablet Take 1 tablet (10 mg total) by mouth at bedtime as needed for muscle spasms. 10/05/21  Yes [name], Wynelle Fanny, MD  EPINEPHrine (EPIPEN 2-PAK) 0.3  mg/0.3 mL IJ SOAJ injection Inject 0.3 mg into the muscle as needed for anaphylaxis. 12/30/21  Yes Padgett, Rae Halsted, MD  etonogestrel (NEXPLANON) 68 MG IMPL implant Inject 1 each into the skin continuous. Reported on 01/27/2016   Yes [provider]  famotidine (PEPCID) 20 MG tablet TAKE 1 TABLET BY MOUTH 2 TIMES DAILY Patient taking differently: Take 20 mg by mouth 2 (two) times daily. 01/25/22  Yes [name], Wynelle Fanny, MD  meclizine (ANTIVERT) 25 MG tablet Take 50 mg by mouth as needed for dizziness.   Yes [provider]  meloxicam (MOBIC) 7.5 MG tablet Take 7.5 mg by mouth 2 (two) times daily. 09/21/22  Yes [provider]  methocarbamol (ROBAXIN) 500 MG tablet Take 500 mg by mouth every 8 (eight) hours as needed. 10/03/22  Yes [provider]  metoCLOPramide (REGLAN) 10 MG tablet TAKE ONE TABLET BY MOUTH EVERY 6 HOURS AS NEEDED FOR NAUSEA OR VOMITING Patient taking differently: Take 10 mg by mouth every 6 (six) hours as needed for vomiting or nausea. 09/30/22  Yes [name], Wynelle Fanny, MD  ondansetron (ZOFRAN) 8 MG tablet Take 8 mg by mouth 3 (three) times daily. 06/24/21  Yes [provider]  promethazine (PHENERGAN) 25 MG suppository Place 1 suppository (25 mg total) rectally every 6 (six) hours as needed for nausea or vomiting. 04/26/22  Yes [name], Wynelle Fanny, MD  valACYclovir (VALTREX) 1000 MG tablet Take 1 tablet (1,000 mg total) by mouth 3 (three) times daily. 08/24/22  Yes [name], Wynelle Fanny, MD  valACYclovir (VALTREX) 500 MG tablet Take 500 mg by mouth daily. 09/29/22  Yes [provider]    Inpatient Medications: Scheduled Meds:  amitriptyline  50 mg Oral QHS   amLODipine  10 mg Oral Daily   aspirin  325 mg Oral Daily   [START ON 10/12/2022] cloNIDine  0.2 mg Transdermal Q Wed   heparin  5,000 Units Subcutaneous Q8H   hydrALAZINE  25 mg Oral Q8H   metoprolol tartrate  12.5 mg Oral BID   sodium chloride flush  3 mL Intravenous Q12H    valACYclovir  500 mg Oral Daily   Continuous Infusions:  lactated ringers     piperacillin-tazobactam (ZOSYN)  IV 3.375 g (10/06/22 1416)   PRN Meds: acetaminophen **OR** acetaminophen, ALPRAZolam, fentaNYL (SUBLIMAZE) injection, labetalol, nitroGLYCERIN, ondansetron (ZOFRAN) IV  Allergies:    Allergies  Allergen Reactions   Trazodone And Nefazodone Nausea And Vomiting   Amoxicillin Other (See Comments)    Yeast infection    Trazodone Nausea And Vomiting   Carafate [Sucralfate] Nausea And Vomiting    Social History:   Social History   Socioeconomic History   Marital status: Legally Separated    Spouse name: Not on file   Number of children: 3   Years of education: college   Highest education level: Not on file  Occupational History   Occupation: Unemployed  Tobacco Use   Smoking status: Never   Smokeless tobacco: Never  Vaping Use   Vaping Use: Never  used  Substance and Sexual Activity   Alcohol use: Yes    Comment: occasional   Drug use: Yes    Types: Marijuana    Comment: smokes small amount twice daily   Sexual activity: Not Currently    Birth control/protection: None  Other Topics Concern   Not on file  Social History Narrative   Lives at home with her children.   6 cups caffeine per day.   Right-handed.   Social Determinants of Health   Financial Resource Strain: High Risk (08/15/2022)   Overall Financial Resource Strain (CARDIA)    Difficulty of Paying Living Expenses: Hard  Food Insecurity: No Food Insecurity (10/06/2022)   Hunger Vital Sign    Worried About Running Out of Food in the Last Year: Never true    [place] in the Last Year: Never true  Recent Concern: Food Insecurity - Food Insecurity Present (08/17/2022)   Hunger Vital Sign    Worried About Running Out of Food in the Last Year: Sometimes true    Ran Out of Food in the Last Year: Sometimes true  Transportation Needs: No Transportation Needs (10/06/2022)   PRAPARE - Armed forces logistics/support/administrative officer (Medical): No    Lack of Transportation (Non-Medical): No  Physical Activity: Inactive (08/15/2022)   Exercise Vital Sign    Days of Exercise per Week: 0 days    Minutes of Exercise per Session: 0 min  Stress: No Stress Concern Present (08/15/2022)   [place]    Feeling of Stress : Only a little  Social Connections: Moderately Isolated (08/15/2022)   Social Connection and Isolation Panel [NHANES]    Frequency of Communication with Friends and Family: More than three times a week    Frequency of Social Gatherings with Friends and Family: Three times a week    Attends Religious Services: More than 4 times per year    Active Member of Clubs or Organizations: No    Attends Archivist Meetings: Never    Marital Status: Separated  Intimate Partner Violence: Not At Risk (10/06/2022)   Humiliation, Afraid, Rape, and Kick questionnaire    Fear of Current or Ex-Partner: No    Emotionally Abused: No    Physically Abused: No    Sexually Abused: No    Family History:    Family History  Problem Relation Age of Onset   Healthy Mother    Diabetes Father    COPD Father    Breast cancer Paternal Aunt    Lung cancer Paternal Aunt    Lung cancer Paternal Uncle    Breast cancer Paternal Grandmother    Colon cancer Paternal Grandmother    Rectal cancer Paternal Grandmother    Esophageal cancer Cousin    Stomach cancer Neg Hx      ROS:  Please see the history of present illness.   All other ROS reviewed and negative.     Physical Exam/Data:   Vitals:   10/06/22 1135 10/06/22 1411 10/06/22 1430 10/06/22 1505  BP: 102/84 115/81 98/80 123/84  Pulse: 95  83 88  Resp:   (!) 24 18  Temp: 98.4 F (36.9 C)  98.7 F (37.1 C) 98.2 F (36.8 C)  TempSrc: Oral  Oral Oral  SpO2:   100% 100%  Weight:      Height:        Intake/Output Summary (Last 24 hours) at 10/06/2022 1636 Last data  filed at 10/06/2022 1600 Gross per 24 hour  Intake 3968.41 ml  Output --  Net 3968.41 ml      10/05/2022    2:23 PM 08/24/2022   12:28 PM 07/09/2022    8:57 AM  Last 3 Weights  Weight (lbs) 190 lb 0.6 oz 190 lb 187 lb 6.3 oz  Weight (kg) 86.2 kg 86.183 kg 85 kg     Body mass index is 35.91 kg/m.  General:  Well nourished, well developed, in no acute distress HEENT: normal Neck: no JVD Vascular: No carotid bruits; Distal pulses 2+ bilaterally Cardiac:  normal S1, S2; RRR; no murmur  Lungs:  clear to auscultation bilaterally, no wheezing, rhonchi or rales  Abd: soft, nontender, no hepatomegaly  Ext: no edema Musculoskeletal:  No deformities, BUE and BLE strength normal and equal Skin: warm and dry  Neuro:  CNs 2-12 intact, no focal abnormalities noted Psych:  Normal affect   EKG:  The EKG was personally reviewed and demonstrates:  normal sinus rhythm Telemetry:  Telemetry was personally reviewed and demonstrates:  sinus rhythm.   Relevant CV Studies:  10/06/2022 Limited TTE  IMPRESSIONS     1. No apparent apical thrombus noted. Left ventricular ejection fraction,  by estimation, is 45 to 50%. The left ventricle has mildly decreased  function. The left ventricle demonstrates regional wall motion  abnormalities (see scoring diagram/findings for   description). Left ventricular diastolic parameters are indeterminate.   2. Right ventricular systolic function was not well visualized. The right  ventricular size is not well visualized.   3. The mitral valve is normal in structure. not assessed mitral valve  regurgitation. Not assessed mitral stenosis.   4. The aortic valve was not well visualized. Aortic valve regurgitation  Not assessed. not assessed.   Comparison(s): A prior study was performed on 05/06/2022. Wall motion  abnormailties are new.   FINDINGS   Left Ventricle: No apparent apical thrombus noted. Left ventricular  ejection fraction, by estimation, is 45 to 50%.  The left ventricle has  mildly decreased function. The left ventricle demonstrates regional wall  motion abnormalities. Definity contrast  agent was given IV to delineate the left ventricular endocardial borders.  Left ventricular diastolic parameters are indeterminate.     LV Wall Scoring:  The mid anteroseptal segment, apical lateral segment, apical inferior  segment, and apex are akinetic. The entire anterior wall, antero-lateral  wall, inferior septum, inferior wall, posterior wall, and basal  anteroseptal  segment are hypokinetic.   Right Ventricle: The right ventricular size is not well visualized. Right  vetricular wall thickness was not assessed. Right ventricular systolic  function was not well visualized.   Left Atrium: Left atrial size was not assessed.   Right Atrium: Right atrial size was not assessed.   Mitral Valve: The mitral valve is normal in structure. Not assessed mitral  valve regurgitation. Not assessed mitral valve stenosis.   Tricuspid Valve: The tricuspid valve is not assessed.   Aortic Valve: The aortic valve was not well visualized. Aortic valve  regurgitation Not assessed. Not assessed.   Pulmonic Valve: The pulmonic valve was not assessed.   Aorta: Aortic root could not be assessed.   IAS/Shunts: The interatrial septum was not assessed.   Additional Comments: Spectral Doppler performed. Color Doppler performed.   Laboratory Data:  High Sensitivity Troponin:   Recent Labs  Lab 10/04/22 2155 10/05/22 1423 10/05/22 1623 10/06/22 0345 10/06/22 0538  TROPONINIHS 22* 437* 307* 125* 134*  Chemistry Recent Labs  Lab 10/05/22 1423 10/06/22 0345 10/06/22 1559  NA 135 138 139  K 4.0 2.9* 4.0  CL 100 106 106  CO2 19* 21* 22  GLUCOSE 135* 144* 83  BUN '16 17 17  '$ CREATININE 1.73* 1.48* 1.70*  CALCIUM 10.9* 9.4 9.4  MG  --  1.8  --   GFRNONAA 35* 42* 36*  ANIONGAP 16* 11 11    Recent Labs  Lab 10/04/22 2155 10/05/22 1423  10/06/22 0345  PROT 8.8* 9.5* 6.8  ALBUMIN 5.0 5.3* 3.8  AST 31 47* 34  ALT '14 14 16  '$ ALKPHOS 57 60 52  BILITOT 1.4* 0.9 1.1   Lipids No results for input(s): "CHOL", "TRIG", "HDL", "LABVLDL", "LDLCALC", "CHOLHDL" in the last 168 hours.  Hematology Recent Labs  Lab 10/04/22 2155 10/05/22 1423 10/06/22 1559  WBC 13.8* 18.5* 13.3*  RBC 4.58 5.42* 4.70  HGB 15.1* 17.7* 15.7*  HCT 43.8 50.8* 46.6*  MCV 95.6 93.7 99.1  MCH 33.0 32.7 33.4  MCHC 34.5 34.8 33.7  RDW 13.9 14.2 14.6  PLT 199 184 143*   Thyroid No results for input(s): "TSH", "FREET4" in the last 168 hours.  BNPNo results for input(s): "BNP", "PROBNP" in the last 168 hours.  DDimer No results for input(s): "DDIMER" in the last 168 hours.   Radiology/Studies:  NM Hepatobiliary Liver Func  Result Date: 10/06/2022 CLINICAL DATA:  Right upper quadrant abdominal pain. Acalculous cholecystitis suspected. EXAM: NUCLEAR MEDICINE HEPATOBILIARY IMAGING TECHNIQUE: Sequential images of the abdomen were obtained out to 60 minutes following intravenous administration of radiopharmaceutical. RADIOPHARMACEUTICALS:  5.4 mCi Tc-44m Choletec IV COMPARISON:  Abdominal ultrasound 10/05/2022. CT chest abdomen and pelvis 10/06/2019. FINDINGS: Prompt uptake and biliary excretion of activity by the liver is seen. Gallbladder activity is visualized, consistent with patency of cystic duct. Biliary activity passes into small bowel, consistent with patent common bile duct. IMPRESSION: Normal exam. Electronically Signed   By: ARonney AstersM.D.   On: 10/06/2022 16:23   ECHOCARDIOGRAM LIMITED  Result Date: 10/06/2022    ECHOCARDIOGRAM LIMITED REPORT   Patient Name:   Dana BRASINGTONDate of Exam: 10/06/2022 Medical Rec #:  [id number]           Height:       61.0 in Accession #:    [id number]          Weight:       190.0 lb Date of Birth:  108-Apr-1972          BSA:          1.848 m Patient Age:    57years             BP:           104/91 mmHg  Patient Gender: F                    HR:           97 bpm. Exam Location:  Inpatient Procedure: Limited Echo, Cardiac Doppler, Limited Color Doppler and Intracardiac            Opacification Agent Indications:    Elevated Troponin                 NSTEMI I21.4  History:        Patient has prior history of Echocardiogram examinations, most                 recent  05/06/2022. Stroke, Signs/Symptoms:Chest Pain; Risk                 Factors:Hypertension and Non-Smoker.  Sonographer:    Greer Pickerel Referring Phys: [id number] TIMOTHY S OPYD  Sonographer Comments: Image acquisition challenging due to patient body habitus and Image acquisition challenging due to respiratory motion. IMPRESSIONS  1. No apparent apical thrombus noted. Left ventricular ejection fraction, by estimation, is 45 to 50%. The left ventricle has mildly decreased function. The left ventricle demonstrates regional wall motion abnormalities (see scoring diagram/findings for  description). Left ventricular diastolic parameters are indeterminate.  2. Right ventricular systolic function was not well visualized. The right ventricular size is not well visualized.  3. The mitral valve is normal in structure. not assessed mitral valve regurgitation. Not assessed mitral stenosis.  4. The aortic valve was not well visualized. Aortic valve regurgitation Not assessed. not assessed. Comparison(s): A prior study was performed on 05/06/2022. Wall motion abnormailties are new. FINDINGS  Left Ventricle: No apparent apical thrombus noted. Left ventricular ejection fraction, by estimation, is 45 to 50%. The left ventricle has mildly decreased function. The left ventricle demonstrates regional wall motion abnormalities. Definity contrast agent was given IV to delineate the left ventricular endocardial borders. Left ventricular diastolic parameters are indeterminate.  LV Wall Scoring: The mid anteroseptal segment, apical lateral segment, apical inferior segment, and apex are  akinetic. The entire anterior wall, antero-lateral wall, inferior septum, inferior wall, posterior wall, and basal anteroseptal segment are hypokinetic. Right Ventricle: The right ventricular size is not well visualized. Right vetricular wall thickness was not assessed. Right ventricular systolic function was not well visualized. Left Atrium: Left atrial size was not assessed. Right Atrium: Right atrial size was not assessed. Mitral Valve: The mitral valve is normal in structure. Not assessed mitral valve regurgitation. Not assessed mitral valve stenosis. Tricuspid Valve: The tricuspid valve is not assessed. Aortic Valve: The aortic valve was not well visualized. Aortic valve regurgitation Not assessed. Not assessed. Pulmonic Valve: The pulmonic valve was not assessed. Aorta: Aortic root could not be assessed. IAS/Shunts: The interatrial septum was not assessed. Additional Comments: Spectral Doppler performed. Color Doppler performed.   LV Volumes (MOD) LV vol d, MOD A2C: 60.4 ml LV vol d, MOD A4C: 106.0 ml LV vol s, MOD A2C: 34.0 ml LV vol s, MOD A4C: 48.0 ml LV SV MOD A2C:     26.4 ml LV SV MOD A4C:     106.0 ml LV SV MOD BP:      41.3 ml MITRAL VALVE MV Area (PHT): 4.71 cm MV Decel Time: 161 msec MV E velocity: 36.00 cm/s MV A velocity: 81.00 cm/s MV E/A ratio:  0.44 Kardie Tobb DO Electronically signed by Berniece Salines DO Signature Date/Time: 10/06/2022/10:38:24 AM    Final    US Abdomen Limited RUQ (LIVER/GB)  Result Date: 10/05/2022 CLINICAL DATA:  Pain right upper quadrant EXAM: ULTRASOUND ABDOMEN LIMITED RIGHT UPPER QUADRANT COMPARISON:  CT done earlier today FINDINGS: Gallbladder: There are no demonstrable gallbladder stones. There is wall thickening in gallbladder measuring up to 7.6 mm. Technologist did not observe any tenderness over the gallbladder. According to the note by the technologist, patient is on pain medications. Gallbladder is not distended. Phrygian cap is seen in the fundus. Common bile  duct: Diameter: 4.4 mm Liver: No focal lesion identified. Within normal limits in parenchymal echogenicity. Portal vein is patent on color Doppler imaging with normal direction of blood flow towards the liver. Other: None. IMPRESSION: There  is wall thickening in gallbladder which may be due to acute or chronic cholecystitis. There are no demonstrable gallbladder stones. Technologist did not observe any tenderness over the gallbladder during the study. Gallbladder is not distended. There is no dilation of bile ducts. Right upper quadrant sonogram is otherwise unremarkable.  Two 2 Electronically Signed   By: Elmer Picker M.D.   On: 10/05/2022 19:43   CT Angio Chest/Abd/Pel for Dissection W and/or Wo Contrast  Result Date: 10/05/2022 CLINICAL DATA:  Lower abdominal pain and mid chest pain radiating to the arms. Evaluate for acute aortic syndrome. EXAM: CT ANGIOGRAPHY CHEST, ABDOMEN AND PELVIS TECHNIQUE: Non-contrast CT of the chest was initially obtained. Multidetector CT imaging through the chest, abdomen and pelvis was performed using the standard protocol during bolus administration of intravenous contrast. Multiplanar reconstructed images and MIPs were obtained and reviewed to evaluate the vascular anatomy. RADIATION DOSE REDUCTION: This exam was performed according to the departmental dose-optimization program which includes automated exposure control, adjustment of the mA and/or kV according to patient size and/or use of iterative reconstruction technique. CONTRAST:  60m OMNIPAQUE IOHEXOL 350 MG/ML SOLN COMPARISON:  CT abdomen and pelvis 01/18/2022 FINDINGS: CTA CHEST FINDINGS Cardiovascular: Normal caliber of the thoracic aorta without dissection or intramural hematoma. Thoracic aorta is tortuous due to the scoliosis. Bovine type arch. Great vessels are patent. Heart size is normal. Main pulmonary arteries are patent without large filling defects. Mediastinum/Nodes: Thyroid tissue appears to be  diffusely enlarged. No mediastinal or hilar lymph node enlargement. No axillary lymph node enlargement. No gross abnormality to the esophagus. Lungs/Pleura: Trachea and mainstem bronchi are patent. Lungs are clear without airspace disease or consolidation. No large pleural effusions. Musculoskeletal: Dextroscoliosis in the thoracic spine. Disc space narrowing and endplate changes along the left side of the mid thoracic spine related to the scoliosis. No acute bone abnormality. Review of the MIP images confirms the above findings. CTA ABDOMEN AND PELVIS FINDINGS VASCULAR Aorta: Normal caliber aorta without aneurysm, dissection, vasculitis or significant stenosis. Celiac: Patent without evidence of aneurysm, dissection, vasculitis or significant stenosis. Incidentally, there is an accessory left hepatic artery originating from the left gastric artery. SMA: Patent without evidence of aneurysm, dissection, vasculitis or significant stenosis. Renals: Both renal arteries are patent without evidence of aneurysm, dissection, vasculitis, fibromuscular dysplasia or significant stenosis. IMA: Patent. Inflow: Patent without evidence of aneurysm, dissection, vasculitis or significant stenosis. Veins: No obvious venous abnormality within the limitations of this arterial phase study. Review of the MIP images confirms the above findings. NON-VASCULAR Hepatobiliary: Concern for diffuse gallbladder wall thickening. Cannot exclude gallbladder inflammation or pericholecystic fluid. Gallbladder findings are new since prior examination. No discrete liver lesion. No significant biliary dilatation. Pancreas: Unremarkable. No pancreatic ductal dilatation or surrounding inflammatory changes. Spleen: Normal in size without focal abnormality. Adrenals/Urinary Tract: 1.0 cm low-density nodule in the right adrenal gland measures 6 Hounsfield units on the pre contrast images and suggestive for a benign adenoma. Mild stranding is noted around the  left adrenal gland and appears to be new. Some of this could be related to non-opacified venous structures. Normal appearance of both kidneys without hydronephrosis. No suspicious renal lesions. No ureter dilatation. Normal appearance of the urinary bladder. Stomach/Bowel: Stomach is within normal limits. Appendix appears normal. No evidence of bowel wall thickening, distention, or inflammatory changes. Lymphatic: No lymph node enlargement in the abdomen or pelvis. Reproductive: Stable appearance of the uterus. Normal appearance of the left adnexa. Water attenuating structure in the right adnexa  likely represents ovarian or adnexal cyst measuring up to 3.1 cm. Other: Negative for free fluid.  Negative for free air. Musculoskeletal: No acute or significant osseous findings. Review of the MIP images confirms the above findings. IMPRESSION: 1. Negative for an aortic dissection or aneurysm. 2. Concern for gallbladder wall thickening. Findings are concerning for acute cholecystitis. Recommend right upper quadrant ultrasound for further characterization. 3. Mild stranding near the left adrenal gland is nonspecific. 4. 3.1 cm right ovarian simple-appearing cyst. Recommend follow-up pelvic ultrasound in 6-12 months. Reference: JACR 2020 Feb;17(2):248-254 5. Thyroid gland enlargement. 6. Scoliosis in the thoracic spine with degenerative changes. 7. Right adrenal adenoma. Electronically Signed   By: Markus Daft M.D.   On: 10/05/2022 17:46   DG Chest Port 1 View  Result Date: 10/05/2022 CLINICAL DATA:  Chest pain. EXAM: PORTABLE CHEST 1 VIEW COMPARISON:  Chest x-ray 10/04/2022. FINDINGS: The heart size and mediastinal contours are within normal limits. Both lungs are clear. No visible pleural effusions or pneumothorax. No acute osseous abnormality. S-shaped thoracolumbar scoliosis. IMPRESSION: No active disease. Electronically Signed   By: Margaretha Sheffield M.D.   On: 10/05/2022 14:58   DG Chest Port 1 View  Result  Date: 10/04/2022 CLINICAL DATA:  Chest pain EXAM: PORTABLE CHEST 1 VIEW COMPARISON:  None Available. FINDINGS: Lungs are well expanded, symmetric, and clear. No pneumothorax or pleural effusion. Cardiac size within normal limits. Pulmonary vascularity is normal. Moderate thoracic dextroscoliosis again noted. No acute bone abnormality. IMPRESSION: No active disease. Electronically Signed   By: Fidela Salisbury M.D.   On: 10/04/2022 22:19     Assessment and Plan:   Atypical chest pain Elevated troponin Abnormal LV wall motion  Patient admitted with abdominal and chest pain, concern for cholecystitis. Cardiology initially consulted while patient was at [facility] due to troponin of 437, 307. At the time, it was felt that this leak was a result of demand ischemia. Upon arrival to [facility], troponin rechecked, found to be 125, 134. Limited echo today notable for LVEF 45-50% with mid anteroseptal segment, apical lateral segment, apical inferior segment, and apex akinesis. The entire anterior wall, antero-lateral wall, inferior septum, inferior wall, posterior wall, and basal  anteroseptal segment are hypokinetic. Due to these abnormalities, cardiology re-engaged.  ECG today without acute ischemic change. A review of vitals data shows both tachycardia and profound hypertension yesterday and this morning.  Given lack of cardiac history and extremely atypical symptoms, it is difficult to attribute troponin leak and WMA to acute cardiac process. She has been without pain and ECG remains reassuring.  Could consider Lexiscan stress testing, will discuss with Dr. Johnsie Cancel Will need repeat TTE to assess wall motion in the future.   Hypertension  Patient denies significant hypertension on home BP machine. She has a dermal patch that she wears from her functional medicine provider. Unfortunately has been quite hypertensive in the hospital. Continue Amlodipine '10mg'$ , Metoprolol 12.'5mg'$  BID, Hydralazine 25 TID.    Per primary  team:  Abdominal pain and nausea   Risk Assessment/Risk Scores:                For questions or updates, please contact [facility] Please consult [email or web address] for contact info under    Signed, Lily Kocher, PA-C  10/06/2022 4:36 PM  Patient examined chart reviewed. Discussed care with patient and PA Exam with overweight black female no murmur clear lungs mild abdominal pain. No edema ? Peripheral neuropathy in feet. She has had chronic GI issues with  over 10 year history of nausea and vomiting Initially thought to have GB dx but HIDA negative. SSCP in setting of nausea and vomiting with loose stools. ECG non acute troponin negative but TTE abnormal Read as EF 45-50% but to my eye 50-55% ? Inferior basal hypokinesis. Discussed need to further risk stratify for CAD. Contrast study not ideal with Cr so cath/cardiac CTA less appealing She is willing to have lexiscan myovue in am Discussed fact that [place] may make her feel poorly for a minute or two and she is willing to proceed  Regarding her long standing "attacks" and GI issues I wonder if she should be worked up for porphyria.  She has IBS and seems embedded with [place] medicine with a long standing MD in [place] I also gave her name of Mariah Milling with [facility] [employer] locally who has a masters degree in [place] in case she wants to see someone more locally   Jenkins Rouge MD [facility]

## 2022-10-06 NOTE — ED Notes (Signed)
Pt OTF to [facility]

## 2022-10-06 NOTE — ED Notes (Signed)
MD made aware of critical lactic lab of 8.3

## 2022-10-06 NOTE — ED Notes (Signed)
Pt currently experiencing mid-line chest pain. Pt already given 3 SL nitro. Pt status and vital signs communicated to Opyd, MD.

## 2022-10-06 NOTE — ED Notes (Signed)
Significantly elevated BP communicated to Opyd MD after giving PRN labetalol

## 2022-10-06 NOTE — Progress Notes (Signed)
[facility] PROGRESS NOTE  Dana Greer (DOB: Sep 14, 1971) QIO:[id number] PCP: Abner Greenspan, MD  Brief Narrative: Dana Greer is a 51 y.o. female with a history of HTN, anxiety, migraine, relapsing/remitting nausea, vomiting, and fibromyalgia who presented to [facility] ED 12/6 with several complaints including chest pain, back pain, abdominal pain, N/V, sweating  Patient reports becoming diaphoretic when she woke early in the morning of 10/04/2022 to use the bathroom.  Since then, she developed cramping lower abdominal pain that she describes as spreading up into the upper abdomen and lower chest where she has a dull and constant discomfort with occasional sharp component.  This has been associated with nausea and nonbloody vomiting with 4-5 episodes today and more than that yesterday.  She has not been eating since symptom onset.  She states that nitroglycerin in the ED "might of helped some" with the pain.   She was afebrile in the ED with tachycardia, significant HTN. CXR negative. CTA chest/abdomen/pelvis is negative for acute aortic findings but notable for gallbladder wall thickening. Right upper quadrant ultrasound also demonstrates gallbladder wall thickening without stones, Murphy sign, or dilated ducts. Blood work most notable for creatinine up from normal baseline to 1.73, WBC 18.5k, hemoglobin 17.7, and troponin 437.    ED physician reviewed the case with cardiology who indicated that this is likely demand ischemia rather than ACS. Blood cultures were collected and and the patient was treated with aspirin, fentanyl, 2 L LR, morphine, zofran, nitroglycerin, and zosyn in the emergency department.  General surgery (Dr. Rosendo Gros) was consulted by the ED physician and the patient was transferred to [facility] for HIDA scan and surgical consultation.  Subjective: Concerned about her blood pressure being very high in the ED. Has had nausea, no oral intake and no  vomiting.   Objective: BP 123/84 (BP Location: Right Arm)   Pulse 88   Temp 98.2 F (36.8 C) (Oral)   Resp 18   Ht _0  (1.549 m)   Wt 86.2 kg   LMP 10/16/2019 (Within Days) Comment: had hysterectomy-still has half of her uterus and ovaries  SpO2 100%   BMI 35.91 kg/m   Gen: Nontoxic female in no distress Pulm: Clear, nonlabored  CV: RRR, no MRG, JVD or pitting edema GI: Soft, tender diffusely worst in upper quadrants, no Murphy sign, no distention, +BS. Neuro: Alert and oriented. No new focal deficits. Ext: Warm, no deformities Skin: No rashes, lesions or ulcers on visualized skin   Assessment & Plan: Concern for cholecystitis: LFTs are wnl.  - Continue zosyn - HIDA scan and surgical consult pending.   AKI: Cr improving 1.7 > 1.4 but not at baseline. Presumably prerenal with dehydration from GI losses, poor intake. Also received contrast for CTA C/A/P. - Continue IVF. Push po once done with HIDA and cleared by surgery.  - Trend daily.  - Avoid nephrotoxins as able - Urinalysis w/micro and urine lytes still pending  HTN urgency, troponin elevation, chest pain: Pt has myriad and sundry complaints inclusive of chest discomfort. ECG this AM without ST deviations. No acute findings on CTA chest. - Peaked at 437, will recheck to confirm continued downward trend. Repeat ECG.   - Continue aspirin - With BP in 218/136, started norvasc 16m and hydralazine 244mthis morning with improvement. Pain control also helping. Will aim to avoid precipitous decline in BP. Continue clonidine patch (replaces this on Sundays). Was given po dose x1 as well.  - Echo pending this AM.  Update, shows new wall motion abnormalities, LVEF 45-50%. Will consult cardiology for further recommendations.  - Start low dose metoprolol - prn NTG  Lactic acidosis: Improved acid-base, lactic acid cleared. - Continue IVF, maintain relatively normal BP.  Hypokalemia:  - Supplemented this morning, recheck ordered  for noon is still pending.   Chronic headache, migraine, fibromyalgia, anxiety:  - Continue elavil, xanax  Right ovarian cyst:  - Follow up by U/S in 6-12 months.  Patrecia Pour, MD [facility] [email or web address] 10/06/2022, 3:54 PM

## 2022-10-06 NOTE — ED Notes (Signed)
Pt is NPO until post procedure with Nuc Med.

## 2022-10-06 NOTE — Progress Notes (Signed)
  Echocardiogram 2D Echocardiogram has been performed.  Dana Greer 10/06/2022, 9:45 AM

## 2022-10-06 NOTE — ED Notes (Addendum)
Critical troponin value and low K value communicated to Opyd, MD at this time.

## 2022-10-07 ENCOUNTER — Other Ambulatory Visit: Payer: Self-pay | Admitting: Physician Assistant

## 2022-10-07 ENCOUNTER — Other Ambulatory Visit: Payer: Self-pay

## 2022-10-07 DIAGNOSIS — R079 Chest pain, unspecified: Secondary | ICD-10-CM

## 2022-10-07 DIAGNOSIS — R7989 Other specified abnormal findings of blood chemistry: Secondary | ICD-10-CM | POA: Diagnosis not present

## 2022-10-07 DIAGNOSIS — K819 Cholecystitis, unspecified: Secondary | ICD-10-CM | POA: Diagnosis not present

## 2022-10-07 DIAGNOSIS — N179 Acute kidney failure, unspecified: Secondary | ICD-10-CM | POA: Diagnosis not present

## 2022-10-07 DIAGNOSIS — F419 Anxiety disorder, unspecified: Secondary | ICD-10-CM | POA: Diagnosis not present

## 2022-10-07 LAB — CBC
HCT: 38 % (ref 36.0–46.0)
Hemoglobin: 13.4 g/dL (ref 12.0–15.0)
MCH: 33.7 pg (ref 26.0–34.0)
MCHC: 35.3 g/dL (ref 30.0–36.0)
MCV: 95.5 fL (ref 80.0–100.0)
Platelets: 127 10*3/uL — ABNORMAL LOW (ref 150–400)
RBC: 3.98 MIL/uL (ref 3.87–5.11)
RDW: 14.6 % (ref 11.5–15.5)
WBC: 8.8 10*3/uL (ref 4.0–10.5)
nRBC: 0 % (ref 0.0–0.2)

## 2022-10-07 LAB — COMPREHENSIVE METABOLIC PANEL
ALT: 14 U/L (ref 0–44)
AST: 22 U/L (ref 15–41)
Albumin: 3.2 g/dL — ABNORMAL LOW (ref 3.5–5.0)
Alkaline Phosphatase: 39 U/L (ref 38–126)
Anion gap: 12 (ref 5–15)
BUN: 16 mg/dL (ref 6–20)
CO2: 20 mmol/L — ABNORMAL LOW (ref 22–32)
Calcium: 8.8 mg/dL — ABNORMAL LOW (ref 8.9–10.3)
Chloride: 107 mmol/L (ref 98–111)
Creatinine, Ser: 1.44 mg/dL — ABNORMAL HIGH (ref 0.44–1.00)
GFR, Estimated: 44 mL/min — ABNORMAL LOW (ref >60)
Glucose, Bld: 104 mg/dL — ABNORMAL HIGH (ref 70–99)
Potassium: 3.2 mmol/L — ABNORMAL LOW (ref 3.5–5.1)
Sodium: 139 mmol/L (ref 135–145)
Total Bilirubin: 1 mg/dL (ref 0.3–1.2)
Total Protein: 5.6 g/dL — ABNORMAL LOW (ref 6.5–8.1)

## 2022-10-07 MED ORDER — PANTOPRAZOLE SODIUM 40 MG IV SOLR
40.0000 mg | Freq: Once | INTRAVENOUS | Status: AC
  Administered 2022-10-07: 40 mg via INTRAVENOUS
  Filled 2022-10-07: qty 10

## 2022-10-07 MED ORDER — POTASSIUM CHLORIDE 2 MEQ/ML IV SOLN
INTRAVENOUS | Status: DC
  Filled 2022-10-07 (×4): qty 1000

## 2022-10-07 MED ORDER — FAMOTIDINE 20 MG PO TABS: 20.0000 mg | ORAL_TABLET | Freq: Every day | ORAL | Status: DC

## 2022-10-07 MED ORDER — FAMOTIDINE 20 MG PO TABS
20.0000 mg | ORAL_TABLET | Freq: Every day | ORAL | Status: DC
  Administered 2022-10-07 – 2022-10-08 (×2): 20 mg via ORAL
  Filled 2022-10-07 (×2): qty 1

## 2022-10-07 MED ORDER — PANTOPRAZOLE SODIUM 40 MG PO TBEC
40.0000 mg | DELAYED_RELEASE_TABLET | Freq: Every day | ORAL | Status: DC
  Administered 2022-10-08: 40 mg via ORAL
  Filled 2022-10-07: qty 1

## 2022-10-07 NOTE — Care Management (Signed)
  Transition of Care (TOC) Screening Note   Patient Details  Name: Dana Greer Date of Birth: 10/31/1971   Transition of Care [facility]) CM/SW Contact:    Bethena Roys, RN Phone Number: 10/07/2022, 2:49 PM    Transition of Care Department [facility]) has reviewed the patient and no TOC needs have been identified at this time. Case Manager received a consult for medication assistance. Patient reports that she does not have any issues with getting medications. Patient states she has a PCP and gets to appointments without any issues. No home needs identified at this time. We will continue to monitor patient advancement through interdisciplinary progression rounds. If new patient transition needs arise, please place a TOC consult.

## 2022-10-07 NOTE — Progress Notes (Signed)
Pharmacy Antibiotic Note  Dana Greer is a 51 y.o. female admitted on 10/05/2022 with concern of cholecystitis.  Pharmacy has been consulted for Zosyn dosing.  -CrCl ~ 45, cultures- ngtd  Plan: Continue Zosyn 3.375g Q8H F/u cultures for de-escalation Monitor renal function for dose adjustments  Height: '5\' 1"'$  (154.9 cm) Weight: 83.5 kg (184 lb) IBW/kg (Calculated) : 47.8  Temp (24hrs), Avg:98.6 F (37 C), Min:98.2 F (36.8 C), Max:99.1 F (37.3 C)  Recent Labs  Lab 10/04/22 2155 10/05/22 1423 10/05/22 1748 10/05/22 2109 10/06/22 0036 10/06/22 0345 10/06/22 1559 10/07/22 0239  WBC 13.8* 18.5*  --   --   --   --  13.3* 8.8  CREATININE 0.95 1.73*  --   --   --  1.48* 1.70* 1.44*  LATICACIDVEN  --   --  1.4 8.3* 1.3  --   --   --      Estimated Creatinine Clearance: 44.8 mL/min (A) (by C-G formula based on SCr of 1.44 mg/dL (H)).    Allergies  Allergen Reactions   Trazodone And Nefazodone Nausea And Vomiting   Amoxicillin Other (See Comments)    Yeast infection    Trazodone Nausea And Vomiting   Carafate [Sucralfate] Nausea And Vomiting    Antimicrobials this admission: Zosyn 12/6 >>  Microbiology results: 12/6 Bcx: ngtd  Thank you for allowing pharmacy to be a part of this patient's care.  Hildred Laser, PharmD Clinical Pharmacist **Pharmacist phone directory can now be found on [email or web address] ([email or web address]).  Listed under [place].

## 2022-10-07 NOTE — Progress Notes (Addendum)
Rounding Note    Patient Name: Dana Greer Date of Encounter: 10/07/2022  [place] Cardiologist: New to [facility] - Dr. Johnsie Cancel  Subjective   Has chronic N/V, significantly better after seeing a natural chinese herbalist.  Chest pain worse with swallowing, body rotation and deep inspiration.  Inpatient Medications    Scheduled Meds:  amitriptyline  50 mg Oral QHS   amLODipine  10 mg Oral Daily   aspirin  325 mg Oral Daily   [START ON 10/12/2022] cloNIDine  0.2 mg Transdermal Q Wed   heparin  5,000 Units Subcutaneous Q8H   hydrALAZINE  25 mg Oral Q8H   metoprolol tartrate  12.5 mg Oral BID   sodium chloride flush  3 mL Intravenous Q12H   valACYclovir  500 mg Oral Daily   Continuous Infusions:  lactated ringers 1,000 mL with potassium chloride 20 mEq infusion 100 mL/hr at 10/07/22 0908   piperacillin-tazobactam (ZOSYN)  IV 3.375 g (10/07/22 0655)   PRN Meds: acetaminophen **OR** acetaminophen, ALPRAZolam, alum & mag hydroxide-simeth, fentaNYL (SUBLIMAZE) injection, labetalol, nitroGLYCERIN, ondansetron (ZOFRAN) IV   Vital Signs    Vitals:   10/06/22 2109 10/06/22 2306 10/07/22 0451 10/07/22 0903  BP: 118/87 106/75 108/82 110/74  Pulse: 65  73 81  Resp: 18  16   Temp: 98.8 F (37.1 C)  99.1 F (37.3 C)   TempSrc: Oral  Oral   SpO2: 99%  98%   Weight:   83.5 kg   Height:        Intake/Output Summary (Last 24 hours) at 10/07/2022 0945 Last data filed at 10/07/2022 0528 Gross per 24 hour  Intake 1912.55 ml  Output --  Net 1912.55 ml      10/07/2022    4:51 AM 10/05/2022    2:23 PM 08/24/2022   12:28 PM  Last 3 Weights  Weight (lbs) 184 lb 190 lb 0.6 oz 190 lb  Weight (kg) 83.462 kg 86.2 kg 86.183 kg      Telemetry    NSR without significant ventricular ectopy - Personally Reviewed  ECG    NSR without significant ST-T wave changes - Personally Reviewed  Physical Exam   GEN: No acute distress.   Neck: No JVD Cardiac: RRR, no  murmurs, rubs, or gallops.  Respiratory: Clear to auscultation bilaterally. GI: Soft, nontender, non-distended  MS: No edema; No deformity. Neuro:  Nonfocal  Psych: Normal affect   Labs    High Sensitivity Troponin:   Recent Labs  Lab 10/05/22 1423 10/05/22 1623 10/06/22 0345 10/06/22 0538 10/06/22 1636  TROPONINIHS 437* 307* 125* 134* 59*     Chemistry Recent Labs  Lab 10/05/22 1423 10/06/22 0345 10/06/22 1559 10/07/22 0239  NA 135 138 139 139  K 4.0 2.9* 4.0 3.2*  CL 100 106 106 107  CO2 19* 21* 22 20*  GLUCOSE 135* 144* 83 104*  BUN '16 17 17 16  '$ CREATININE 1.73* 1.48* 1.70* 1.44*  CALCIUM 10.9* 9.4 9.4 8.8*  MG  --  1.8  --   --   PROT 9.5* 6.8  --  5.6*  ALBUMIN 5.3* 3.8  --  3.2*  AST 47* 34  --  22  ALT 14 16  --  14  ALKPHOS 60 52  --  39  BILITOT 0.9 1.1  --  1.0  GFRNONAA 35* 42* 36* 44*  ANIONGAP 16* '11 11 12    '$ Lipids No results for input(s): "CHOL", "TRIG", "HDL", "LABVLDL", "LDLCALC", "CHOLHDL" in the  last 168 hours.  Hematology Recent Labs  Lab 10/05/22 1423 10/06/22 1559 10/07/22 0239  WBC 18.5* 13.3* 8.8  RBC 5.42* 4.70 3.98  HGB 17.7* 15.7* 13.4  HCT 50.8* 46.6* 38.0  MCV 93.7 99.1 95.5  MCH 32.7 33.4 33.7  MCHC 34.8 33.7 35.3  RDW 14.2 14.6 14.6  PLT 184 143* 127*   Thyroid No results for input(s): "TSH", "FREET4" in the last 168 hours.  BNPNo results for input(s): "BNP", "PROBNP" in the last 168 hours.  DDimer No results for input(s): "DDIMER" in the last 168 hours.   Radiology    NM Hepatobiliary Liver Func  Result Date: 10/06/2022 CLINICAL DATA:  Right upper quadrant abdominal pain. Acalculous cholecystitis suspected. EXAM: NUCLEAR MEDICINE HEPATOBILIARY IMAGING TECHNIQUE: Sequential images of the abdomen were obtained out to 60 minutes following intravenous administration of radiopharmaceutical. RADIOPHARMACEUTICALS:  5.4 mCi Tc-51m Choletec IV COMPARISON:  Abdominal ultrasound 10/05/2022. CT chest abdomen and pelvis  10/06/2019. FINDINGS: Prompt uptake and biliary excretion of activity by the liver is seen. Gallbladder activity is visualized, consistent with patency of cystic duct. Biliary activity passes into small bowel, consistent with patent common bile duct. IMPRESSION: Normal exam. Electronically Signed   By: ARonney AstersM.D.   On: 10/06/2022 16:23   ECHOCARDIOGRAM LIMITED  Result Date: 10/06/2022    ECHOCARDIOGRAM LIMITED REPORT   Patient Name:   Dana BRANDENBURGERDate of Exam: 10/06/2022 Medical Rec #:  [id number]           Height:       61.0 in Accession #:    [id number]          Weight:       190.0 lb Date of Birth:  1September 19, 1972          BSA:          1.848 m Patient Age:    51years             BP:           104/91 mmHg Patient Gender: F                    HR:           97 bpm. Exam Location:  Inpatient Procedure: Limited Echo, Cardiac Doppler, Limited Color Doppler and Intracardiac            Opacification Agent Indications:    Elevated Troponin                 NSTEMI I21.4  History:        Patient has prior history of Echocardiogram examinations, most                 recent 05/06/2022. Stroke, Signs/Symptoms:Chest Pain; Risk                 Factors:Hypertension and Non-Smoker.  Sonographer:    NGreer PickerelReferring Phys: [id number] S OPYD  Sonographer Comments: Image acquisition challenging due to patient body habitus and Image acquisition challenging due to respiratory motion. IMPRESSIONS  1. No apparent apical thrombus noted. Left ventricular ejection fraction, by estimation, is 45 to 50%. The left ventricle has mildly decreased function. The left ventricle demonstrates regional wall motion abnormalities (see scoring diagram/findings for  description). Left ventricular diastolic parameters are indeterminate.  2. Right ventricular systolic function was not well visualized. The right ventricular size is not well visualized.  3. The mitral valve is normal in  structure. not assessed mitral valve  regurgitation. Not assessed mitral stenosis.  4. The aortic valve was not well visualized. Aortic valve regurgitation Not assessed. not assessed. Comparison(s): A prior study was performed on 05/06/2022. Wall motion abnormailties are new. FINDINGS  Left Ventricle: No apparent apical thrombus noted. Left ventricular ejection fraction, by estimation, is 45 to 50%. The left ventricle has mildly decreased function. The left ventricle demonstrates regional wall motion abnormalities. Definity contrast agent was given IV to delineate the left ventricular endocardial borders. Left ventricular diastolic parameters are indeterminate.  LV Wall Scoring: The mid anteroseptal segment, apical lateral segment, apical inferior segment, and apex are akinetic. The entire anterior wall, antero-lateral wall, inferior septum, inferior wall, posterior wall, and basal anteroseptal segment are hypokinetic. Right Ventricle: The right ventricular size is not well visualized. Right vetricular wall thickness was not assessed. Right ventricular systolic function was not well visualized. Left Atrium: Left atrial size was not assessed. Right Atrium: Right atrial size was not assessed. Mitral Valve: The mitral valve is normal in structure. Not assessed mitral valve regurgitation. Not assessed mitral valve stenosis. Tricuspid Valve: The tricuspid valve is not assessed. Aortic Valve: The aortic valve was not well visualized. Aortic valve regurgitation Not assessed. Not assessed. Pulmonic Valve: The pulmonic valve was not assessed. Aorta: Aortic root could not be assessed. IAS/Shunts: The interatrial septum was not assessed. Additional Comments: Spectral Doppler performed. Color Doppler performed.   LV Volumes (MOD) LV vol d, MOD A2C: 60.4 ml LV vol d, MOD A4C: 106.0 ml LV vol s, MOD A2C: 34.0 ml LV vol s, MOD A4C: 48.0 ml LV SV MOD A2C:     26.4 ml LV SV MOD A4C:     106.0 ml LV SV MOD BP:      41.3 ml MITRAL VALVE MV Area (PHT): 4.71 cm MV Decel  Time: 161 msec MV E velocity: 36.00 cm/s MV A velocity: 81.00 cm/s MV E/A ratio:  0.44 Kardie Tobb DO Electronically signed by Berniece Salines DO Signature Date/Time: 10/06/2022/10:38:24 AM    Final    US Abdomen Limited RUQ (LIVER/GB)  Result Date: 10/05/2022 CLINICAL DATA:  Pain right upper quadrant EXAM: ULTRASOUND ABDOMEN LIMITED RIGHT UPPER QUADRANT COMPARISON:  CT done earlier today FINDINGS: Gallbladder: There are no demonstrable gallbladder stones. There is wall thickening in gallbladder measuring up to 7.6 mm. Technologist did not observe any tenderness over the gallbladder. According to the note by the technologist, patient is on pain medications. Gallbladder is not distended. Phrygian cap is seen in the fundus. Common bile duct: Diameter: 4.4 mm Liver: No focal lesion identified. Within normal limits in parenchymal echogenicity. Portal vein is patent on color Doppler imaging with normal direction of blood flow towards the liver. Other: None. IMPRESSION: There is wall thickening in gallbladder which may be due to acute or chronic cholecystitis. There are no demonstrable gallbladder stones. Technologist did not observe any tenderness over the gallbladder during the study. Gallbladder is not distended. There is no dilation of bile ducts. Right upper quadrant sonogram is otherwise unremarkable.  Two 2 Electronically Signed   By: Elmer Picker M.D.   On: 10/05/2022 19:43   CT Angio Chest/Abd/Pel for Dissection W and/or Wo Contrast  Result Date: 10/05/2022 CLINICAL DATA:  Lower abdominal pain and mid chest pain radiating to the arms. Evaluate for acute aortic syndrome. EXAM: CT ANGIOGRAPHY CHEST, ABDOMEN AND PELVIS TECHNIQUE: Non-contrast CT of the chest was initially obtained. Multidetector CT imaging through the chest, abdomen and pelvis was  performed using the standard protocol during bolus administration of intravenous contrast. Multiplanar reconstructed images and MIPs were obtained and reviewed  to evaluate the vascular anatomy. RADIATION DOSE REDUCTION: This exam was performed according to the departmental dose-optimization program which includes automated exposure control, adjustment of the mA and/or kV according to patient size and/or use of iterative reconstruction technique. CONTRAST:  9m OMNIPAQUE IOHEXOL 350 MG/ML SOLN COMPARISON:  CT abdomen and pelvis 01/18/2022 FINDINGS: CTA CHEST FINDINGS Cardiovascular: Normal caliber of the thoracic aorta without dissection or intramural hematoma. Thoracic aorta is tortuous due to the scoliosis. Bovine type arch. Great vessels are patent. Heart size is normal. Main pulmonary arteries are patent without large filling defects. Mediastinum/Nodes: Thyroid tissue appears to be diffusely enlarged. No mediastinal or hilar lymph node enlargement. No axillary lymph node enlargement. No gross abnormality to the esophagus. Lungs/Pleura: Trachea and mainstem bronchi are patent. Lungs are clear without airspace disease or consolidation. No large pleural effusions. Musculoskeletal: Dextroscoliosis in the thoracic spine. Disc space narrowing and endplate changes along the left side of the mid thoracic spine related to the scoliosis. No acute bone abnormality. Review of the MIP images confirms the above findings. CTA ABDOMEN AND PELVIS FINDINGS VASCULAR Aorta: Normal caliber aorta without aneurysm, dissection, vasculitis or significant stenosis. Celiac: Patent without evidence of aneurysm, dissection, vasculitis or significant stenosis. Incidentally, there is an accessory left hepatic artery originating from the left gastric artery. SMA: Patent without evidence of aneurysm, dissection, vasculitis or significant stenosis. Renals: Both renal arteries are patent without evidence of aneurysm, dissection, vasculitis, fibromuscular dysplasia or significant stenosis. IMA: Patent. Inflow: Patent without evidence of aneurysm, dissection, vasculitis or significant stenosis. Veins: No  obvious venous abnormality within the limitations of this arterial phase study. Review of the MIP images confirms the above findings. NON-VASCULAR Hepatobiliary: Concern for diffuse gallbladder wall thickening. Cannot exclude gallbladder inflammation or pericholecystic fluid. Gallbladder findings are new since prior examination. No discrete liver lesion. No significant biliary dilatation. Pancreas: Unremarkable. No pancreatic ductal dilatation or surrounding inflammatory changes. Spleen: Normal in size without focal abnormality. Adrenals/Urinary Tract: 1.0 cm low-density nodule in the right adrenal gland measures 6 Hounsfield units on the pre contrast images and suggestive for a benign adenoma. Mild stranding is noted around the left adrenal gland and appears to be new. Some of this could be related to non-opacified venous structures. Normal appearance of both kidneys without hydronephrosis. No suspicious renal lesions. No ureter dilatation. Normal appearance of the urinary bladder. Stomach/Bowel: Stomach is within normal limits. Appendix appears normal. No evidence of bowel wall thickening, distention, or inflammatory changes. Lymphatic: No lymph node enlargement in the abdomen or pelvis. Reproductive: Stable appearance of the uterus. Normal appearance of the left adnexa. Water attenuating structure in the right adnexa likely represents ovarian or adnexal cyst measuring up to 3.1 cm. Other: Negative for free fluid.  Negative for free air. Musculoskeletal: No acute or significant osseous findings. Review of the MIP images confirms the above findings. IMPRESSION: 1. Negative for an aortic dissection or aneurysm. 2. Concern for gallbladder wall thickening. Findings are concerning for acute cholecystitis. Recommend right upper quadrant ultrasound for further characterization. 3. Mild stranding near the left adrenal gland is nonspecific. 4. 3.1 cm right ovarian simple-appearing cyst. Recommend follow-up pelvic  ultrasound in 6-12 months. Reference: JACR 2020 Feb;17(2):248-254 5. Thyroid gland enlargement. 6. Scoliosis in the thoracic spine with degenerative changes. 7. Right adrenal adenoma. Electronically Signed   By: AMarkus DaftM.D.   On: 10/05/2022 17:46  DG Chest Port 1 View  Result Date: 10/05/2022 CLINICAL DATA:  Chest pain. EXAM: PORTABLE CHEST 1 VIEW COMPARISON:  Chest x-ray 10/04/2022. FINDINGS: The heart size and mediastinal contours are within normal limits. Both lungs are clear. No visible pleural effusions or pneumothorax. No acute osseous abnormality. S-shaped thoracolumbar scoliosis. IMPRESSION: No active disease. Electronically Signed   By: Margaretha Sheffield M.D.   On: 10/05/2022 14:58    Cardiac Studies   Limited echo 10/06/2022  1. No apparent apical thrombus noted. Left ventricular ejection fraction,  by estimation, is 45 to 50%. The left ventricle has mildly decreased  function. The left ventricle demonstrates regional wall motion  abnormalities (see scoring diagram/findings for   description). Left ventricular diastolic parameters are indeterminate.   2. Right ventricular systolic function was not well visualized. The right  ventricular size is not well visualized.   3. The mitral valve is normal in structure. not assessed mitral valve  regurgitation. Not assessed mitral stenosis.   4. The aortic valve was not well visualized. Aortic valve regurgitation  Not assessed. not assessed.   Comparison(s): A prior study was performed on 05/06/2022. Wall motion  abnormailties are new.   Patient Profile     51 y.o. female with PMH of HTN, anxiety, migraine, relapsing/remitting N/V in the past 10+ years and fibromyalgia who presented with ED at [facility] on 12/5 with cramping abdominal pain radiating to upper abdomen and chest, also had diaphoresis, N/V. CT chest/abd/pelvis was negative for acute process. Serial trop 437 --> 307. Given concern for gallbladder pathology, patient was  transferred to [facility]. HIDA scan is normal.  Echo showed EF 45-50% with regional wall motion abnormality  Assessment & Plan    Abnormal echo with elevated troponin - Serial trop 437-- >307 --> 125 --> 134. Initially felt to be demand ischemia, however subsequent echo showed wall motion abnormal with boderline low EF. In the setting of severe hypertension. - echo 10/06/2022 showed EF 45-50%, new wall motion abnormality when compare to the previous echo, "The mid anteroseptal segment, apical lateral segment, apical inferior segment, and apex are akinetic. The entire anterior wall, antero-lateral wall, inferior septum, inferior wall, posterior wall, and basal anteroseptal segment are hypokinetic. " - discussed with Nuc med department, Dr. Johnsie Cancel and patient, unable to do nuclear stress test today as patient had HIDA scan yesterday, per hospital protocol, has to wait at least 48 hours before another nuclear study. Had CTA of chest/abd/pelvis last night, Cr 1.4, Dr. Johnsie Cancel did not recommend coronary CT today. We eventually recommended outpatient myoview next week. I spoke with the patient and family, they were agreeable. I will send staff message to our scheduler to arrange. Primary team notified.    Abdominal pain: initially concerning for cholecystitis based on CTA. Transferred to [facility] for HIDA scan.   HTN: Hypertensive on arrival, controlled on current therapy.       For questions or updates, please contact [facility] Please consult [email or web address] for contact info under        Signed, Almyra Deforest, [place]  10/07/2022, 9:45 AM    Patient examined chart reviewed see consult from yesterday Exam in no distress no murmur lungs clear abdomen non acute. She has chronic IBS ? R/o porphyria Pain is atypical with normal ECG and negative troponin but abnormal echo. Unable to do nuclear study today Ok to d/c home from our perspective outpatient f/u arranged with nuclear stress testing Patient ok with this  Also gave her name of  Nicollet who is local and has her Masters Degree in [employer] [employer]

## 2022-10-07 NOTE — Progress Notes (Signed)
[facility] PROGRESS NOTE  Emrey Thornley Greer (DOB: 23-Sep-1971) OYD:[id number] PCP: Abner Greenspan, MD  Brief Narrative: Dana Greer is a 51 y.o. female with a history of HTN, anxiety, migraine, relapsing/remitting nausea, vomiting, and fibromyalgia who presented to [facility] ED 12/6 with several complaints including chest pain, back pain, abdominal pain, N/V, sweating  Patient reports becoming diaphoretic when she woke early in the morning of 10/04/2022 to use the bathroom.  Since then, she developed cramping lower abdominal pain that she describes as spreading up into the upper abdomen and lower chest where she has a dull and constant discomfort with occasional sharp component.  This has been associated with nausea and nonbloody vomiting with 4-5 episodes today and more than that yesterday.  She has not been eating since symptom onset.  She states that nitroglycerin in the ED "might of helped some" with the pain.   She was afebrile in the ED with tachycardia, significant HTN. CXR negative. CTA chest/abdomen/pelvis is negative for acute aortic findings but notable for gallbladder wall thickening. Right upper quadrant ultrasound also demonstrates gallbladder wall thickening without stones, Murphy sign, or dilated ducts. Blood work most notable for creatinine up from normal baseline to 1.73, WBC 18.5k, hemoglobin 17.7, and troponin 437.    ED physician reviewed the case with cardiology who indicated that this is likely demand ischemia rather than ACS. Blood cultures were collected and and the patient was treated with aspirin, fentanyl, 2 L LR, morphine, zofran, nitroglycerin, and zosyn in the emergency department.  General surgery (Dr. Rosendo Gros) was consulted by the ED physician and the patient was transferred to [facility] for HIDA scan and surgical consultation.  Subjective: Having chest pain which occupies upper abdomen to neck and across lower chest worse with attempts at  swallowing which are difficult and painful. Has not eaten sufficiently.   Objective: BP 110/74   Pulse 81   Temp 99.1 F (37.3 C) (Oral)   Resp 16   Ht _0  (1.549 m)   Wt 83.5 kg   LMP 10/16/2019 (Within Days) Comment: had hysterectomy-still has half of her uterus and ovaries  SpO2 98%   BMI 34.77 kg/m   Gen: No distress Pulm: Clear, nonlabored  CV: RRR, no MRG or pitting edema, normal DP pulse GI: Soft, tender to light touch on right, less elsewhere, +BS, nondistended Neuro: Alert and oriented. No new focal deficits. Ext: Warm, no deformities Skin: No rashes, lesions or ulcers on visualized skin   Assessment & Plan: Cholecystitis ruled out. Exam not consistent with acute cholecystitis, HIDA scan normal.   AKI: Cr improving 1.7 > 1.4 but not at baseline and worsened again? Presumably prerenal with dehydration from GI losses, poor intake. Also received contrast for CTA C/A/P. - Continue IVF.  - Avoid nephrotoxins as able - Urinalysis w/micro and urine lytes still pending > collected today, will follow up.   HTN urgency, troponin elevation, chest pain: Pt has myriad and sundry complaints inclusive of chest discomfort. ECG this AM without ST deviations. No acute findings on CTA chest.  - Continue aspirin - With BP in 218/136, started norvasc 21m and hydralazine 246m metoprolol 12.18m7mo BID - Continue clonidine patch (replaces this on Sundays).  - Echo showed new wall motion abnormalities, LVEF 45-50%. Troponin has continued downward trend and chest pain report is atypical, likely more attributable to GI etiology. Initial plan was for inpatient stress test, though nuclear study cannot be repeated (just had HIDA) so quickly. Coronary CTA  vs. cath presents unnecessary risk of CIN with AKI and recent CT C/A/P. Will defer further work up to the outpatient setting at this time. - prn NTG  Nausea, vomiting, abdominal pain: Now with odynophagia, dysphagia.  - Antiemetic prn, remains  IVF dependent at this time. - Give PPI IV x1, then po, give pepcid, pt declines carafate.  - Pt declines GI consultation at this time as she would not want to jump to EGD. If able to satisfy hydration and nutritional requirements with these measures, we will be able to discharge in next 24 hours. - With atypical "attacks" consideration given to abdominal migraine, also sent urine porphyrin and porphobilinogen, to r/o acute intermittent porphyria, which will not result prior to discharge, will need to be followed up by PCP.   Lactic acidosis: Improved acid-base, lactic acid cleared. - Continue IVF, maintain relatively normal BP.  Hypokalemia:  - Supplemented but recurrent due to inadequate PO intake. Will add to IVF and monitor. Mg was wnl.  Chronic headache, migraine, fibromyalgia, anxiety:  - Continue elavil, xanax  Right ovarian cyst:  - Follow up by U/S in 6-12 months.  Patrecia Pour, MD [facility] [email or web address] 10/07/2022, 2:15 PM

## 2022-10-07 NOTE — Progress Notes (Signed)
Initial Nutrition Assessment  DOCUMENTATION CODES:   Obesity unspecified  INTERVENTION:  Monitor for diet advancement Once diet resumes, recommend: Regular diet for widest variety of menu options to optimize PO intake If on a clear liquid diet- Boost Breeze po TID, each supplement provides 250 kcal and 9 grams of protein  If on a full liquid diet or greater- Ensure Enlive po BID, each supplement provides 350 kcal and 20 grams of protein. MVI with minerals daily  NUTRITION DIAGNOSIS:   Inadequate oral intake related to nausea, vomiting as evidenced by per patient/family report.  GOAL:   Patient will meet greater than or equal to 90% of their needs  MONITOR:   Diet advancement, Weight trends, Labs  REASON FOR ASSESSMENT:   Malnutrition Screening Tool    ASSESSMENT:   Pt admitted with chest pain, back pain, abdominal pain, n/v and sweating. PMH significant for HTN, anxiety, relapse/remitting nausea, vomiting and fibromyalgia.  Concern for cholecystitis. Surgical consult pending.   Unsuccessful attempt to reach pt via phone call to room. Unable to obtain detailed nutrition related history at this time.   Per review of chart, pt noted to have multiple episodes of emesis a couple days PTA with associated poor PO intake. Also of note, pt reports frequent episodes of emesis multiple days a month over many year with no clear etiology.   Reviewed weight history. Her weight has been trending down within the last year. It appears she has had a 12.6% weight loss between 03/03-12/08 which is concerning.  Medications and IV drips include: LR @ 121m/hr, zosyn  Labs: potassium 3.2, Cr 1.44, GFR 44   NUTRITION - FOCUSED PHYSICAL EXAM: RD working remotely. Deferred to follow up.   Diet Order:   Diet Order             Diet NPO time specified  Diet effective midnight                   EDUCATION NEEDS:   No education needs have been identified at this time  Skin:  Skin  Assessment: Reviewed RN Assessment  Last BM:  12/6  Height:   Ht Readings from Last 1 Encounters:  10/05/22 '5\' 1"'$  (1.549 m)    Weight:   Wt Readings from Last 1 Encounters:  10/07/22 83.5 kg    Ideal Body Weight:  47.7 kg  BMI:  Body mass index is 34.77 kg/m.  Estimated Nutritional Needs:   Kcal:  1500-1700  Protein:  75-90g  Fluid:  >/=1.5L  AClayborne Dana RDN, LDN Clinical Nutrition

## 2022-10-07 NOTE — Care Management Important Message (Signed)
Important Message  Patient Details  Name: Dana Greer MRN: [id number] Date of Birth: 1971/03/05   Medicare Important Message Given:  Yes     Shelda Altes 10/07/2022, 11:58 AM

## 2022-10-08 ENCOUNTER — Other Ambulatory Visit: Payer: Self-pay

## 2022-10-08 ENCOUNTER — Encounter (HOSPITAL_BASED_OUTPATIENT_CLINIC_OR_DEPARTMENT_OTHER): Payer: Self-pay | Admitting: Radiology

## 2022-10-08 ENCOUNTER — Emergency Department (HOSPITAL_BASED_OUTPATIENT_CLINIC_OR_DEPARTMENT_OTHER)
Admission: EM | Admit: 2022-10-08 | Discharge: 2022-10-08 | Disposition: A | Discharge status: Home / Self Care | Payer: Medicare Other | Attending: Emergency Medicine | Admitting: Emergency Medicine

## 2022-10-08 DIAGNOSIS — N179 Acute kidney failure, unspecified: Secondary | ICD-10-CM | POA: Diagnosis not present

## 2022-10-08 DIAGNOSIS — R2232 Localized swelling, mass and lump, left upper limb: Secondary | ICD-10-CM | POA: Diagnosis not present

## 2022-10-08 DIAGNOSIS — M7989 Other specified soft tissue disorders: Secondary | ICD-10-CM | POA: Diagnosis not present

## 2022-10-08 LAB — CBC
HCT: 35 % — ABNORMAL LOW (ref 36.0–46.0)
Hemoglobin: 11.9 g/dL — ABNORMAL LOW (ref 12.0–15.0)
MCH: 33.8 pg (ref 26.0–34.0)
MCHC: 34 g/dL (ref 30.0–36.0)
MCV: 99.4 fL (ref 80.0–100.0)
Platelets: 119 10*3/uL — ABNORMAL LOW (ref 150–400)
RBC: 3.52 MIL/uL — ABNORMAL LOW (ref 3.87–5.11)
RDW: 14.8 % (ref 11.5–15.5)
WBC: 5.3 10*3/uL (ref 4.0–10.5)
nRBC: 0 % (ref 0.0–0.2)

## 2022-10-08 LAB — COMPREHENSIVE METABOLIC PANEL
ALT: 15 U/L (ref 0–44)
AST: 17 U/L (ref 15–41)
Albumin: 2.8 g/dL — ABNORMAL LOW (ref 3.5–5.0)
Alkaline Phosphatase: 31 U/L — ABNORMAL LOW (ref 38–126)
Anion gap: 9 (ref 5–15)
BUN: 14 mg/dL (ref 6–20)
CO2: 23 mmol/L (ref 22–32)
Calcium: 8.6 mg/dL — ABNORMAL LOW (ref 8.9–10.3)
Chloride: 108 mmol/L (ref 98–111)
Creatinine, Ser: 1.49 mg/dL — ABNORMAL HIGH (ref 0.44–1.00)
GFR, Estimated: 42 mL/min — ABNORMAL LOW (ref >60)
Glucose, Bld: 110 mg/dL — ABNORMAL HIGH (ref 70–99)
Potassium: 3.4 mmol/L — ABNORMAL LOW (ref 3.5–5.1)
Sodium: 140 mmol/L (ref 135–145)
Total Bilirubin: 0.8 mg/dL (ref 0.3–1.2)
Total Protein: 5.1 g/dL — ABNORMAL LOW (ref 6.5–8.1)

## 2022-10-08 MED ORDER — SODIUM CHLORIDE 0.9 % IV SOLN: 8.0000 mg | Freq: Once | INTRAVENOUS | Status: DC | PRN

## 2022-10-08 MED ORDER — PANTOPRAZOLE SODIUM 40 MG PO TBEC: 40.0000 mg | DELAYED_RELEASE_TABLET | Freq: Every day | ORAL | 0 refills | Status: DC

## 2022-10-08 MED ORDER — DOXYCYCLINE HYCLATE 100 MG PO CAPS: 100.0000 mg | ORAL_CAPSULE | Freq: Two times a day (BID) | ORAL | 0 refills | Status: AC

## 2022-10-08 MED ORDER — CLINDAMYCIN HCL 150 MG PO CAPS: 450.0000 mg | ORAL_CAPSULE | Freq: Three times a day (TID) | ORAL | 0 refills | Status: AC

## 2022-10-08 MED ORDER — AMLODIPINE BESYLATE 10 MG PO TABS: 10.0000 mg | ORAL_TABLET | Freq: Every day | ORAL | 0 refills | Status: DC

## 2022-10-08 NOTE — Discharge Instructions (Addendum)
Your left arm swelling is most consistent with extravasation of IV fluid in your left upper arm where you previously had an IV placed while inpatient.  Given the slight pain on exam, evidence of cobblestoning with fluid, will cover you with a course of antibiotics.  Return to the emergency department for worsening redness, pain and swelling despite outpatient antibiotic use.  There is no evidence of abscess on your bedside ultrasound.  Recommend compression of the extremity, elevation of the extremity.

## 2022-10-08 NOTE — ED Triage Notes (Signed)
Pt states she was released from the hospital and got home and noticed that her left arm was oozing from a lump in her arm where she had a IV. Lump noted to her left upper arm, bruises noted.

## 2022-10-08 NOTE — ED Notes (Signed)
Discharge instructions reviewed with patient and she has voiced understanding.

## 2022-10-08 NOTE — Discharge Summary (Signed)
Physician Discharge Summary   Patient: Dana Greer MRN: [id number] DOB: September 03, 1971  Admit date:     10/05/2022  Discharge date: 10/08/22  Discharge Physician: Patrecia Pour   PCP: Abner Greenspan, MD   Recommendations at discharge:  Follow up with PCP in 1-2 weeks suggest recheck BMP and follow up urine porphyrins and porphobilinogen which are pending at discharge. Monitor BP, added norvasc 66m at discharge, continue clonidine patch. Follow up with cardiology as arranged for stress test this coming week. Consider ultrasound in 6-12 months to monitor right ovarian cyst incidentally seen on CT.  Discharge Diagnoses: Principal Problem:   AKI (acute kidney injury) ([place] Active Problems:   POLYCYSTIC OVARIAN DISEASE   Migraine, chronic, without aura   Elevated troponin   Hypertensive urgency   Cholecystitis   Anxiety  Hospital Course: Dana Greer is a 51y.o. female with a history of HTN, anxiety, migraine, relapsing/remitting nausea, vomiting, and fibromyalgia who [facility] with several complaints including chest pain, back pain, abdominal pain, N/V, sweating.   Patient reports becoming diaphoretic when she woke early in the morning of 10/04/2022 to use the bathroom.  Since then, she developed cramping lower abdominal pain that she describes as spreading up into the upper abdomen and lower chest where she has a dull and constant discomfort with occasional sharp component.  This has been associated with nausea and nonbloody vomiting with 4-5 episodes today and more than that yesterday.  She has not been eating since symptom onset.  She states that nitroglycerin in the ED "might have helped some" with the pain.    She was afebrile in the ED with tachycardia, significant HTN. CXR negative. CTA chest/abdomen/pelvis is negative for acute aortic findings but notable for gallbladder wall thickening. Right upper quadrant ultrasound also demonstrates  gallbladder wall thickening without stones, Murphy sign, or dilated ducts. Blood work most notable for creatinine up from normal baseline to 1.73, WBC 18.5k, hemoglobin 17.7, and troponin 437.    She was given aspirin, fentanyl, 2 L LR, morphine, zofran, nitroglycerin, and zosyn in the emergency department. General surgery (Dr. RRosendo Gros was consulted by the ED physician and the patient was transferred [facility] for HIDA scan and surgical consultation. After HIDA was unremarkable, surgery signed off. Echocardiogram showed wall motion abnormalities and mildly reduced LV systolic function. Troponin trended down and chest pain is felt to be quite atypical. Cardiology was consulted, recommending nuclear stress testing which cannot be performed right away due to recent HIDA scan. They have arranged this as an outpatient.   The patient's symptoms have improved with otherwise supportive care including addition of protonix which will continue. She has odynophagia but declines GI evaluation and is taking adequate enteral hydration and nutrition at this time, amenable to discharge. Please see details below.  Assessment and Plan: Cholecystitis ruled out. Exam not consistent with acute cholecystitis, HIDA scan normal.    AKI: Cr improving 1.7 > 1.4. Presumably prerenal with dehydration from GI losses, poor intake. Also received contrast for CTA C/A/P. - Recommend recheck at follow up, counseled to minimize/avoid NSAIDs (i.e. meloxicam) for this as well as GI ulcer risk.   HTN urgency, troponin elevation, chest pain: Pt has myriad and sundry complaints inclusive of chest discomfort. ECG this AM without ST deviations. No acute findings on CTA chest.  - With BP in 218/136, started norvasc 160m Consider addition/replacement with GDMT depending on further cardiology work up - Continue clonidine patch (replaces  this on Sundays).  - Echo showed new wall motion abnormalities, LVEF 45-50%. Troponin has continued  downward trend and chest pain report is atypical, likely more attributable to GI etiology. Initial plan was for inpatient stress test, though nuclear study cannot be repeated (just had HIDA) so quickly. Coronary CTA vs. cath presents unnecessary risk of CIN with AKI and recent CT C/A/P. Stress test has been scheduled as outpatient 12/14.    Nausea, vomiting, abdominal pain: Now with odynophagia, dysphagia though that has improved with addition of PPI. Pt still has odynophagia without visible thrush. She declines offer to get GI evaluation as an inpatient as her symptoms are overall improving. - Given Rx for 30 days protonix. Continue pepcid, pt declines carafate.  - With atypical "attacks" consideration given to abdominal migraine, also sent urine porphyrin and porphobilinogen, to r/o acute intermittent porphyria, which will not result prior to discharge, will need to be followed up by PCP.    Lactic acidosis: Improved acidosis, lactic acid cleared.   Hypokalemia:  - Supplemented with improvement, anticipate continued improvement with improved po intake.  Mg was wnl.   Chronic headache, migraine, fibromyalgia, anxiety:  - Continue elavil, xanax   Right ovarian cyst:  - Follow up by U/S in 6-12 months.  Consultants: Cardiology Procedures performed: Echocardiogram, HIDA  Disposition: Home Diet recommendation:  Cardiac diet DISCHARGE MEDICATION: Allergies as of 10/08/2022       Reactions   Trazodone And Nefazodone Nausea And Vomiting   Amoxicillin Other (See Comments)   Yeast infection    Trazodone Nausea And Vomiting   Carafate [sucralfate] Nausea And Vomiting        Medication List     TAKE these medications    ALPRAZolam 1 MG tablet Commonly known as: XANAX Take 1 mg by mouth 4 (four) times daily as needed for anxiety or sleep.   amitriptyline 25 MG tablet Commonly known as: ELAVIL Take 2 tablets (50 mg total) by mouth daily.   cloNIDine 0.2 mg/24hr patch Commonly  known as: CATAPRES - Dosed in mg/24 hr Place 1 patch (0.2 mg total) onto the skin once a week.   cyclobenzaprine 10 MG tablet Commonly known as: FLEXERIL Take 1 tablet (10 mg total) by mouth at bedtime as needed for muscle spasms.   EPINEPHrine 0.3 mg/0.3 mL Soaj injection Commonly known as: EpiPen 2-Pak Inject 0.3 mg into the muscle as needed for anaphylaxis.   etonogestrel 68 MG Impl implant Commonly known as: NEXPLANON Inject 1 each into the skin continuous. Reported on 01/27/2016   famotidine 20 MG tablet Commonly known as: PEPCID TAKE 1 TABLET BY MOUTH 2 TIMES DAILY   meclizine 25 MG tablet Commonly known as: ANTIVERT Take 50 mg by mouth as needed for dizziness.   meloxicam 7.5 MG tablet Commonly known as: MOBIC Take 7.5 mg by mouth 2 (two) times daily.   methocarbamol 500 MG tablet Commonly known as: ROBAXIN Take 500 mg by mouth every 8 (eight) hours as needed.   metoCLOPramide 10 MG tablet Commonly known as: REGLAN TAKE ONE TABLET BY MOUTH EVERY 6 HOURS AS NEEDED FOR NAUSEA OR VOMITING What changed: See the new instructions.   ondansetron 8 MG tablet Commonly known as: ZOFRAN Take 8 mg by mouth 3 (three) times daily.   pantoprazole 40 MG tablet Commonly known as: PROTONIX Take 1 tablet (40 mg total) by mouth daily. Start taking on: October 09, 2022   promethazine 25 MG suppository Commonly known as: PHENERGAN Place 1 suppository (25  mg total) rectally every 6 (six) hours as needed for nausea or vomiting.   valACYclovir 1000 MG tablet Commonly known as: Valtrex Take 1 tablet (1,000 mg total) by mouth 3 (three) times daily.   valACYclovir 500 MG tablet Commonly known as: VALTREX Take 500 mg by mouth daily.        Follow-up Information     Elgie Collard, PA-C Follow up on 10/20/2022.   Specialty: Cardiology Why: _0 :05PM. Cardiology follow up. Contact information: [address] [place] [address] [address] [facility] [place]. [facility] Follow up on 10/13/2022.   Specialty: Cardiology Why: _1 :30AM. Outpatient nuclear stress test. Contact information: [address], [place] [id number] [place] [address] [phone]        Abner Greenspan, MD Follow up.   Specialties: Family Medicine, Radiology Contact information: [address] [place] [address] [phone]                Discharge Exam: Danley Danker Weights   10/05/22 1423 10/07/22 0451 10/08/22 0631  Weight: 86.2 kg 83.5 kg 84.3 kg  BP 113/76 (BP Location: Left Arm)   Pulse 77   Temp 97.8 F (36.6 C) (Oral)   Resp 16   Ht _2  (1.549 m)   Wt 84.3 kg   LMP 10/16/2019 (Within Days) Comment: had hysterectomy-still has half of her uterus and ovaries  SpO2 98%   BMI 35.11 kg/m   Gen: No distress, pleasant Pulm: Clear, nonlabored  CV: RRR, no MRG or pitting edema GI: Soft, improved tenderness on right without rebound or guarding, ND, +BS  Neuro: Alert and oriented. No new focal deficits. Ext: Warm, no deformities Skin: No rashes, lesions or ulcers on visualized skin.  Condition at discharge: stable  The results of significant diagnostics from this hospitalization (including imaging, microbiology, ancillary and laboratory) are listed below for reference.   Imaging Studies: NM Hepatobiliary Liver Func  Result Date: 10/06/2022 CLINICAL DATA:  Right upper quadrant abdominal pain. Acalculous cholecystitis suspected. EXAM: NUCLEAR MEDICINE HEPATOBILIARY IMAGING TECHNIQUE: Sequential images of the abdomen were obtained out to 60 minutes following intravenous administration of radiopharmaceutical. RADIOPHARMACEUTICALS:  5.4 mCi Tc-20m Choletec IV COMPARISON:  Abdominal ultrasound 10/05/2022. CT chest abdomen and pelvis 10/06/2019. FINDINGS: Prompt uptake and biliary excretion of activity by the liver is seen. Gallbladder activity is visualized, consistent  with patency of cystic duct. Biliary activity passes into small bowel, consistent with patent common bile duct. IMPRESSION: Normal exam. Electronically Signed   By: ARonney AstersM.D.   On: 10/06/2022 16:23   ECHOCARDIOGRAM LIMITED  Result Date: 10/06/2022    ECHOCARDIOGRAM LIMITED REPORT   Patient Name:   LANISAH KUCKDate of Exam: 10/06/2022 Medical Rec #:  [id number]           Height:       61.0 in Accession #:    [id number]          Weight:       190.0 lb Date of Birth:  1Jul 28, 1972          BSA:          1.848 m Patient Age:    89years             BP:           104/91 mmHg Patient Gender: F  HR:           97 bpm. Exam Location:  Inpatient Procedure: Limited Echo, Cardiac Doppler, Limited Color Doppler and Intracardiac            Opacification Agent Indications:    Elevated Troponin                 NSTEMI I21.4  History:        Patient has prior history of Echocardiogram examinations, most                 recent 05/06/2022. Stroke, Signs/Symptoms:Chest Pain; Risk                 Factors:Hypertension and Non-Smoker.  Sonographer:    Greer Pickerel Referring Phys: [id number] TIMOTHY S OPYD  Sonographer Comments: Image acquisition challenging due to patient body habitus and Image acquisition challenging due to respiratory motion. IMPRESSIONS  1. No apparent apical thrombus noted. Left ventricular ejection fraction, by estimation, is 45 to 50%. The left ventricle has mildly decreased function. The left ventricle demonstrates regional wall motion abnormalities (see scoring diagram/findings for  description). Left ventricular diastolic parameters are indeterminate.  2. Right ventricular systolic function was not well visualized. The right ventricular size is not well visualized.  3. The mitral valve is normal in structure. not assessed mitral valve regurgitation. Not assessed mitral stenosis.  4. The aortic valve was not well visualized. Aortic valve regurgitation Not assessed. not assessed.  Comparison(s): A prior study was performed on 05/06/2022. Wall motion abnormailties are new. FINDINGS  Left Ventricle: No apparent apical thrombus noted. Left ventricular ejection fraction, by estimation, is 45 to 50%. The left ventricle has mildly decreased function. The left ventricle demonstrates regional wall motion abnormalities. Definity contrast agent was given IV to delineate the left ventricular endocardial borders. Left ventricular diastolic parameters are indeterminate.  LV Wall Scoring: The mid anteroseptal segment, apical lateral segment, apical inferior segment, and apex are akinetic. The entire anterior wall, antero-lateral wall, inferior septum, inferior wall, posterior wall, and basal anteroseptal segment are hypokinetic. Right Ventricle: The right ventricular size is not well visualized. Right vetricular wall thickness was not assessed. Right ventricular systolic function was not well visualized. Left Atrium: Left atrial size was not assessed. Right Atrium: Right atrial size was not assessed. Mitral Valve: The mitral valve is normal in structure. Not assessed mitral valve regurgitation. Not assessed mitral valve stenosis. Tricuspid Valve: The tricuspid valve is not assessed. Aortic Valve: The aortic valve was not well visualized. Aortic valve regurgitation Not assessed. Not assessed. Pulmonic Valve: The pulmonic valve was not assessed. Aorta: Aortic root could not be assessed. IAS/Shunts: The interatrial septum was not assessed. Additional Comments: Spectral Doppler performed. Color Doppler performed.   LV Volumes (MOD) LV vol d, MOD A2C: 60.4 ml LV vol d, MOD A4C: 106.0 ml LV vol s, MOD A2C: 34.0 ml LV vol s, MOD A4C: 48.0 ml LV SV MOD A2C:     26.4 ml LV SV MOD A4C:     106.0 ml LV SV MOD BP:      41.3 ml MITRAL VALVE MV Area (PHT): 4.71 cm MV Decel Time: 161 msec MV E velocity: 36.00 cm/s MV A velocity: 81.00 cm/s MV E/A ratio:  0.44 Kardie Tobb DO Electronically signed by Berniece Salines DO  Signature Date/Time: 10/06/2022/10:38:24 AM    Final    US Abdomen Limited RUQ (LIVER/GB)  Result Date: 10/05/2022 CLINICAL DATA:  Pain right upper quadrant EXAM: ULTRASOUND ABDOMEN LIMITED  RIGHT UPPER QUADRANT COMPARISON:  CT done earlier today FINDINGS: Gallbladder: There are no demonstrable gallbladder stones. There is wall thickening in gallbladder measuring up to 7.6 mm. Technologist did not observe any tenderness over the gallbladder. According to the note by the technologist, patient is on pain medications. Gallbladder is not distended. Phrygian cap is seen in the fundus. Common bile duct: Diameter: 4.4 mm Liver: No focal lesion identified. Within normal limits in parenchymal echogenicity. Portal vein is patent on color Doppler imaging with normal direction of blood flow towards the liver. Other: None. IMPRESSION: There is wall thickening in gallbladder which may be due to acute or chronic cholecystitis. There are no demonstrable gallbladder stones. Technologist did not observe any tenderness over the gallbladder during the study. Gallbladder is not distended. There is no dilation of bile ducts. Right upper quadrant sonogram is otherwise unremarkable.  Two 2 Electronically Signed   By: Elmer Picker M.D.   On: 10/05/2022 19:43   CT Angio Chest/Abd/Pel for Dissection W and/or Wo Contrast  Result Date: 10/05/2022 CLINICAL DATA:  Lower abdominal pain and mid chest pain radiating to the arms. Evaluate for acute aortic syndrome. EXAM: CT ANGIOGRAPHY CHEST, ABDOMEN AND PELVIS TECHNIQUE: Non-contrast CT of the chest was initially obtained. Multidetector CT imaging through the chest, abdomen and pelvis was performed using the standard protocol during bolus administration of intravenous contrast. Multiplanar reconstructed images and MIPs were obtained and reviewed to evaluate the vascular anatomy. RADIATION DOSE REDUCTION: This exam was performed according to the departmental dose-optimization program  which includes automated exposure control, adjustment of the mA and/or kV according to patient size and/or use of iterative reconstruction technique. CONTRAST:  68m OMNIPAQUE IOHEXOL 350 MG/ML SOLN COMPARISON:  CT abdomen and pelvis 01/18/2022 FINDINGS: CTA CHEST FINDINGS Cardiovascular: Normal caliber of the thoracic aorta without dissection or intramural hematoma. Thoracic aorta is tortuous due to the scoliosis. Bovine type arch. Great vessels are patent. Heart size is normal. Main pulmonary arteries are patent without large filling defects. Mediastinum/Nodes: Thyroid tissue appears to be diffusely enlarged. No mediastinal or hilar lymph node enlargement. No axillary lymph node enlargement. No gross abnormality to the esophagus. Lungs/Pleura: Trachea and mainstem bronchi are patent. Lungs are clear without airspace disease or consolidation. No large pleural effusions. Musculoskeletal: Dextroscoliosis in the thoracic spine. Disc space narrowing and endplate changes along the left side of the mid thoracic spine related to the scoliosis. No acute bone abnormality. Review of the MIP images confirms the above findings. CTA ABDOMEN AND PELVIS FINDINGS VASCULAR Aorta: Normal caliber aorta without aneurysm, dissection, vasculitis or significant stenosis. Celiac: Patent without evidence of aneurysm, dissection, vasculitis or significant stenosis. Incidentally, there is an accessory left hepatic artery originating from the left gastric artery. SMA: Patent without evidence of aneurysm, dissection, vasculitis or significant stenosis. Renals: Both renal arteries are patent without evidence of aneurysm, dissection, vasculitis, fibromuscular dysplasia or significant stenosis. IMA: Patent. Inflow: Patent without evidence of aneurysm, dissection, vasculitis or significant stenosis. Veins: No obvious venous abnormality within the limitations of this arterial phase study. Review of the MIP images confirms the above findings.  NON-VASCULAR Hepatobiliary: Concern for diffuse gallbladder wall thickening. Cannot exclude gallbladder inflammation or pericholecystic fluid. Gallbladder findings are new since prior examination. No discrete liver lesion. No significant biliary dilatation. Pancreas: Unremarkable. No pancreatic ductal dilatation or surrounding inflammatory changes. Spleen: Normal in size without focal abnormality. Adrenals/Urinary Tract: 1.0 cm low-density nodule in the right adrenal gland measures 6 Hounsfield units on the pre contrast images  and suggestive for a benign adenoma. Mild stranding is noted around the left adrenal gland and appears to be new. Some of this could be related to non-opacified venous structures. Normal appearance of both kidneys without hydronephrosis. No suspicious renal lesions. No ureter dilatation. Normal appearance of the urinary bladder. Stomach/Bowel: Stomach is within normal limits. Appendix appears normal. No evidence of bowel wall thickening, distention, or inflammatory changes. Lymphatic: No lymph node enlargement in the abdomen or pelvis. Reproductive: Stable appearance of the uterus. Normal appearance of the left adnexa. Water attenuating structure in the right adnexa likely represents ovarian or adnexal cyst measuring up to 3.1 cm. Other: Negative for free fluid.  Negative for free air. Musculoskeletal: No acute or significant osseous findings. Review of the MIP images confirms the above findings. IMPRESSION: 1. Negative for an aortic dissection or aneurysm. 2. Concern for gallbladder wall thickening. Findings are concerning for acute cholecystitis. Recommend right upper quadrant ultrasound for further characterization. 3. Mild stranding near the left adrenal gland is nonspecific. 4. 3.1 cm right ovarian simple-appearing cyst. Recommend follow-up pelvic ultrasound in 6-12 months. Reference: JACR 2020 Feb;17(2):248-254 5. Thyroid gland enlargement. 6. Scoliosis in the thoracic spine with  degenerative changes. 7. Right adrenal adenoma. Electronically Signed   By: Markus Daft M.D.   On: 10/05/2022 17:46   DG Chest Port 1 View  Result Date: 10/05/2022 CLINICAL DATA:  Chest pain. EXAM: PORTABLE CHEST 1 VIEW COMPARISON:  Chest x-ray 10/04/2022. FINDINGS: The heart size and mediastinal contours are within normal limits. Both lungs are clear. No visible pleural effusions or pneumothorax. No acute osseous abnormality. S-shaped thoracolumbar scoliosis. IMPRESSION: No active disease. Electronically Signed   By: Margaretha Sheffield M.D.   On: 10/05/2022 14:58   DG Chest Port 1 View  Result Date: 10/04/2022 CLINICAL DATA:  Chest pain EXAM: PORTABLE CHEST 1 VIEW COMPARISON:  None Available. FINDINGS: Lungs are well expanded, symmetric, and clear. No pneumothorax or pleural effusion. Cardiac size within normal limits. Pulmonary vascularity is normal. Moderate thoracic dextroscoliosis again noted. No acute bone abnormality. IMPRESSION: No active disease. Electronically Signed   By: Fidela Salisbury M.D.   On: 10/04/2022 22:19    Microbiology: Results for orders placed or performed during the hospital encounter of 10/05/22  Resp Panel by RT-PCR (Flu A&B, Covid) Anterior Nasal Swab     Status: None   Collection Time: 10/05/22  5:40 PM   Specimen: Anterior Nasal Swab  Result Value Ref Range Status   SARS Coronavirus 2 by RT PCR NEGATIVE NEGATIVE Final    Comment: (NOTE) SARS-CoV-2 target nucleic acids are NOT DETECTED.  The SARS-CoV-2 RNA is generally detectable in upper respiratory specimens during the acute phase of infection. The lowest concentration of SARS-CoV-2 viral copies this assay can detect is 138 copies/mL. A negative result does not preclude SARS-Cov-2 infection and should not be used as the sole basis for treatment or other patient management decisions. A negative result may occur with  improper specimen collection/handling, submission of specimen other than nasopharyngeal swab,  presence of viral mutation(s) within the areas targeted by this assay, and inadequate number of viral copies(<138 copies/mL). A negative result must be combined with clinical observations, patient history, and epidemiological information. The expected result is Negative.  Fact Sheet for Patients:  EntrepreneurPulse.com.au  Fact Sheet for Healthcare Providers:  IncredibleEmployment.be  This test is no t yet approved or cleared by the [place] FDA and  has been authorized for detection and/or diagnosis of SARS-CoV-2 by FDA under  an Emergency Use Authorization (EUA). This EUA will remain  in effect (meaning this test can be used) for the duration of the COVID-19 declaration under Section 564(b)(1) of the Act, 21 U.S.C.section 360bbb-3(b)(1), unless the authorization is terminated  or revoked sooner.       Influenza A by PCR NEGATIVE NEGATIVE Final   Influenza B by PCR NEGATIVE NEGATIVE Final    Comment: (NOTE) The Xpert Xpress SARS-CoV-2/FLU/RSV plus assay is intended as an aid in the diagnosis of influenza from Nasopharyngeal swab specimens and should not be used as a sole basis for treatment. Nasal washings and aspirates are unacceptable for Xpert Xpress SARS-CoV-2/FLU/RSV testing.  Fact Sheet for Patients: EntrepreneurPulse.com.au  Fact Sheet for Healthcare Providers: IncredibleEmployment.be  This test is not yet approved or cleared by the [place] FDA and has been authorized for detection and/or diagnosis of SARS-CoV-2 by FDA under an Emergency Use Authorization (EUA). This EUA will remain in effect (meaning this test can be used) for the duration of the COVID-19 declaration under Section 564(b)(1) of the Act, 21 U.S.C. section 360bbb-3(b)(1), unless the authorization is terminated or revoked.  Performed at [facility], [address], [place], [place] [address]    Blood culture (routine x 2)     Status: None (Preliminary result)   Collection Time: 10/05/22  6:11 PM   Specimen: BLOOD  Result Value Ref Range Status   Specimen Description   Final    BLOOD LEFT ANTECUBITAL Performed at [facility], [address], [place], [place] [address]    Special Requests   Final    BOTTLES DRAWN AEROBIC AND ANAEROBIC Blood Culture adequate volume Performed at [facility], [address], [place], [place] [address]    Culture   Final    NO GROWTH 3 DAYS Performed at [facility], [place] [address], [place], [place] [address]    Report Status PENDING  Incomplete  Blood culture (routine x 2)     Status: None (Preliminary result)   Collection Time: 10/05/22  6:16 PM   Specimen: BLOOD  Result Value Ref Range Status   Specimen Description   Final    BLOOD RIGHT ANTECUBITAL Performed at [facility], [address] [place], [place] [address]    Special Requests   Final    BOTTLES DRAWN AEROBIC AND ANAEROBIC Blood Culture adequate volume Performed at [facility] [facility], [address] [place], [place] [address]    Culture   Final    NO GROWTH 3 DAYS Performed at [facility], [place] [address], [place],  [address]    Report Status PENDING  Incomplete    Labs: CBC: Recent Labs  Lab 10/04/22 2155 10/05/22 1423 10/06/22 1559 10/07/22 0239 10/08/22 0124  WBC 13.8* 18.5* 13.3* 8.8 5.3  HGB 15.1* 17.7* 15.7* 13.4 11.9*  HCT 43.8 50.8* 46.6* 38.0 35.0*  MCV 95.6 93.7 99.1 95.5 99.4  PLT 199 184 143* 127* 099*   Basic Metabolic Panel: Recent Labs  Lab 10/05/22 1423 10/06/22 0345 10/06/22 1559 10/07/22 0239 10/08/22 0124  NA 135 138 139 139 140  K 4.0 2.9* 4.0 3.2* 3.4*  CL 100 106 106 107 108  CO2 19* 21* 22 20* 23  GLUCOSE 135* 144* 83 104* 110*  BUN _0 CREATININE 1.73* 1.48* 1.70* 1.44* 1.49*  CALCIUM 10.9* 9.4 9.4 8.8* 8.6*  MG  --   1.8  --   --   --  Liver Function Tests: Recent Labs  Lab 10/04/22 2155 10/05/22 1423 10/06/22 0345 10/07/22 0239 10/08/22 0124  AST 31 47* 34 22 17  ALT _0 ALKPHOS 57 60 52 39 31*  BILITOT 1.4* 0.9 1.1 1.0 0.8  PROT 8.8* 9.5* 6.8 5.6* 5.1*  ALBUMIN 5.0 5.3* 3.8 3.2* 2.8*   CBG: No results for input(s): "GLUCAP" in the last 168 hours.  Discharge time spent: greater than 30 minutes.  Signed: Patrecia Pour, MD [facility] 10/08/2022

## 2022-10-08 NOTE — ED Provider Notes (Addendum)
[facility] EMERGENCY DEPT Provider Note   CSN: [id number] Arrival date & time: 10/08/22  1511     History  Chief Complaint  Patient presents with   Arm Swelling    Dana Greer is a 51 y.o. female.  HPI   51 year old female with medical history significant for recent admission and discharge from the emergency department for AKI, concern for cholecystitis with a reassuring HIDA scan, treated with IV fluids, IV antibiotics who presents to the emergency department with swelling in her left upper arm.  The patient states that she had her IV removed at discharge today and has noticed some swelling in her left upper arm with associated pain.  No redness.  No fever or chills.  No purulence.  She had some liquid come out from the arm.  Home Medications Prior to Admission medications   Medication Sig Start Date End Date Taking? Authorizing Provider  clindamycin (CLEOCIN) 150 MG capsule Take 3 capsules (450 mg total) by mouth 3 (three) times daily for 5 days. 10/08/22 10/13/22 Yes Regan Lemming, MD  doxycycline (VIBRAMYCIN) 100 MG capsule Take 1 capsule (100 mg total) by mouth 2 (two) times daily for 5 days. 10/08/22 10/13/22 Yes Regan Lemming, MD  ALPRAZolam Duanne Moron) 1 MG tablet Take 1 mg by mouth 4 (four) times daily as needed for anxiety or sleep.     [provider]  amitriptyline (ELAVIL) 25 MG tablet Take 2 tablets (50 mg total) by mouth daily. 05/18/20   Kayleen Memos, DO  amLODipine (NORVASC) 10 MG tablet Take 1 tablet (10 mg total) by mouth daily. 10/09/22   Patrecia Pour, MD  cloNIDine (CATAPRES - DOSED IN MG/24 HR) 0.2 mg/24hr patch Place 1 patch (0.2 mg total) onto the skin once a week. 07/13/22   [name], Wynelle Fanny, MD  cyclobenzaprine (FLEXERIL) 10 MG tablet Take 1 tablet (10 mg total) by mouth at bedtime as needed for muscle spasms. 10/05/21   [name], Wynelle Fanny, MD  EPINEPHrine (EPIPEN 2-PAK) 0.3 mg/0.3 mL IJ SOAJ injection Inject 0.3 mg into the muscle as  needed for anaphylaxis. 12/30/21   Kennith Gain, MD  etonogestrel (NEXPLANON) 68 MG IMPL implant Inject 1 each into the skin continuous. Reported on 01/27/2016    [provider]  famotidine (PEPCID) 20 MG tablet TAKE 1 TABLET BY MOUTH 2 TIMES DAILY Patient taking differently: Take 20 mg by mouth 2 (two) times daily. 01/25/22   [name], Wynelle Fanny, MD  meclizine (ANTIVERT) 25 MG tablet Take 50 mg by mouth as needed for dizziness.    [provider]  meloxicam (MOBIC) 7.5 MG tablet Take 7.5 mg by mouth 2 (two) times daily. 09/21/22   [provider]  methocarbamol (ROBAXIN) 500 MG tablet Take 500 mg by mouth every 8 (eight) hours as needed. 10/03/22   [provider]  metoCLOPramide (REGLAN) 10 MG tablet TAKE ONE TABLET BY MOUTH EVERY 6 HOURS AS NEEDED FOR NAUSEA OR VOMITING Patient taking differently: Take 10 mg by mouth every 6 (six) hours as needed for vomiting or nausea. 09/30/22   [name], Wynelle Fanny, MD  ondansetron (ZOFRAN) 8 MG tablet Take 8 mg by mouth 3 (three) times daily. 06/24/21   [provider]  pantoprazole (PROTONIX) 40 MG tablet Take 1 tablet (40 mg total) by mouth daily. 10/09/22   Patrecia Pour, MD  promethazine (PHENERGAN) 25 MG suppository Place 1 suppository (25 mg total) rectally every 6 (six) hours as needed for nausea or  vomiting. 04/26/22   [name], Wynelle Fanny, MD  valACYclovir (VALTREX) 1000 MG tablet Take 1 tablet (1,000 mg total) by mouth 3 (three) times daily. 08/24/22   [name], Wynelle Fanny, MD  valACYclovir (VALTREX) 500 MG tablet Take 500 mg by mouth daily. 09/29/22   [provider]      Allergies    Trazodone and nefazodone, Amoxicillin, Trazodone, and Carafate [sucralfate]    Review of Systems   Review of Systems  All other systems reviewed and are negative.   Physical Exam Updated Vital Signs BP 120/83 (BP Location: Right Arm)   Pulse 73   Temp 98 F (36.7 C) (Oral)   Resp 18   Ht '5\' 1"'$  (1.549 m)   Wt 83.9 kg    LMP 10/16/2019 (Within Days) Comment: had hysterectomy-still has half of her uterus and ovaries  SpO2 99%   BMI 34.96 kg/m  Physical Exam Vitals and nursing note reviewed.  Constitutional:      General: She is not in acute distress. HENT:     Head: Normocephalic and atraumatic.  Eyes:     Conjunctiva/sclera: Conjunctivae normal.     Pupils: Pupils are equal, round, and reactive to light.  Cardiovascular:     Rate and Rhythm: Normal rate and regular rhythm.  Pulmonary:     Effort: Pulmonary effort is normal. No respiratory distress.  Abdominal:     General: There is no distension.     Tenderness: There is no guarding.  Musculoskeletal:        General: No deformity or signs of injury.     Cervical back: Neck supple.     Comments: Left upper arm with small amount of edema surrounding prior IV site, localized to a small area, no fluctuance, no erythema, mild tenderness to palpation  Skin:    Findings: No lesion or rash.  Neurological:     General: No focal deficit present.     Mental Status: She is alert. Mental status is at baseline.     ED Results / Procedures / Treatments   Labs (all labs ordered are listed, but only abnormal results are displayed) Labs Reviewed - No data to display  EKG None  Radiology No results found.  Procedures Ultrasound ED Soft Tissue  Date/Time: 10/08/2022 5:42 PM  Performed by: Regan Lemming, MD Authorized by: Regan Lemming, MD   Procedure details:    Indications: limb pain, localization of abscess and evaluate for cellulitis     Transverse view:  Visualized   Longitudinal view:  Visualized   Images: not archived   Location:    Location: upper extremity     Side:  Left Findings:     no abscess present Comments:     Cobblestoning present in the soft tissues concerning for soft tissue fluid extravasation versus developing cellulitis     Medications Ordered in ED Medications - No data to display  ED Course/ Medical Decision  Making/ A&P                           Medical Decision Making Risk Prescription drug management.   HPI   51 year old female with medical history significant for recent admission and discharge from the emergency department for AKI, concern for cholecystitis with a reassuring HIDA scan, treated with IV fluids, IV antibiotics who presents to the emergency department with swelling in her left upper arm.  The patient states that she had her IV removed at discharge  today and has noticed some swelling in her left upper arm with associated pain.  No redness.  No fever or chills.  No purulence.  She had some liquid come out from the arm.  On arrival, the patient was vitally stable.  Left upper arm was noted to have a small amount of edema surrounding prior IV site, localized to a small area, no fluctuance, no erythema, mild tenderness to palpation noted on exam.  Frontal diagnosis includes extravasation of IV fluid versus cellulitis versus abscess.  Lower concern for infection at this time however considered.  A bedside ultrasound was performed which revealed cobblestoning concerning for fluid in the soft tissues.  With no surrounding erythema, I discussed my differential with the patient.  As a precaution, will place the patient on antibiotics and have her follow-up outpatient.  Recommended compression and elevation of the affected extremity.  Stable for discharge.   Final Clinical Impression(s) / ED Diagnoses Final diagnoses:  Left arm swelling    Rx / DC Orders ED Discharge Orders          Ordered    doxycycline (VIBRAMYCIN) 100 MG capsule  2 times daily        10/08/22 1739    clindamycin (CLEOCIN) 150 MG capsule  3 times daily        10/08/22 1739              Regan Lemming, MD 10/08/22 1742    Regan Lemming, MD 10/08/22 1743

## 2022-10-08 NOTE — ED Notes (Signed)
Pt left upper arm site of swelling wrapped with ace wrap.

## 2022-10-10 LAB — CULTURE, BLOOD (ROUTINE X 2)
Culture: NO GROWTH
Culture: NO GROWTH
Special Requests: ADEQUATE
Special Requests: ADEQUATE

## 2022-10-10 LAB — PORPHOBILINOGEN, RANDOM URINE: Quantitative Porphobilinogen: 0.5 mg/L (ref 0.0–2.0)

## 2022-10-11 LAB — PORPHYRINS, FRACTIONATED URINE (TIMED COLLECTION)
Coproporphyrin I: 49 ug/L
Coproporphyrin III: 20 ug/L
Heptacarboxyl (7-CP): 5 ug/L
Hexacarboxyl (6-CP): <1 ug/L
Pentacarboxyl (5-CP): 4 ug/L
Uroporphyrins (UP): 17 ug/L

## 2022-10-12 NOTE — Telephone Encounter (Signed)
Spoke with patient to give instructions for MPI study on 10/13/22.

## 2022-10-13 ENCOUNTER — Ambulatory Visit (HOSPITAL_COMMUNITY): Payer: Medicare Other | Attending: Physician Assistant

## 2022-10-13 VITALS — Ht 61.0 in | Wt 184.0 lb

## 2022-10-13 DIAGNOSIS — R079 Chest pain, unspecified: Secondary | ICD-10-CM | POA: Diagnosis not present

## 2022-10-13 DIAGNOSIS — R110 Nausea: Secondary | ICD-10-CM | POA: Insufficient documentation

## 2022-10-13 LAB — MYOCARDIAL PERFUSION IMAGING
LV dias vol: 81 mL (ref 46–106)
LV sys vol: 31 mL
Nuc Stress EF: 62 %
Peak HR: 111 {beats}/min
Rest HR: 65 {beats}/min
Rest Nuclear Isotope Dose: 10.8 mCi
SDS: 0
SRS: 0
SSS: 0
ST Depression (mm): 0 mm
Stress Nuclear Isotope Dose: 32.9 mCi
TID: 1.03

## 2022-10-13 MED ORDER — TECHNETIUM TC 99M TETROFOSMIN IV KIT
10.8000 | PACK | Freq: Once | INTRAVENOUS | Status: AC | PRN
  Administered 2022-10-13: 10.8 via INTRAVENOUS

## 2022-10-13 MED ORDER — AMINOPHYLLINE 25 MG/ML IV SOLN
75.0000 mg | Freq: Once | INTRAVENOUS | Status: AC
  Administered 2022-10-13: 75 mg via INTRAVENOUS

## 2022-10-13 MED ORDER — TECHNETIUM TC 99M TETROFOSMIN IV KIT
32.9000 | PACK | Freq: Once | INTRAVENOUS | Status: AC | PRN
  Administered 2022-10-13: 32.9 via INTRAVENOUS

## 2022-10-13 MED ORDER — REGADENOSON 0.4 MG/5ML IV SOLN
0.4000 mg | Freq: Once | INTRAVENOUS | Status: AC
  Administered 2022-10-13: 0.4 mg via INTRAVENOUS

## 2022-10-19 NOTE — Progress Notes (Unsigned)
Office Visit    Patient Name: Dana Greer Date of Encounter: 10/20/2022  PCP:  Abner Greenspan, MD   [place]  Cardiologist:  Nelva Bush, MD  Advanced Practice Provider:  No care team member to display Electrophysiologist:  None    Chief Complaint    Dana Greer is a 51 y.o. female with a past medical history significant for depression, headache, fibromyalgia, hypertension, and stroke presents today for hospital follow-up.  She was seen in the ED 12/6. She has severe hypertension > 675 systolic.  Echocardiogram 10/06/2022 showed EF 45 to 50%, new wall motion abnormality when compared to previous echo.  Outpatient Lexiscan Myoview was arranged.  Stress test was low risk and no revascularization was indicated.  She did have some serial troponins in the ED greatest was 437.  Felt to be demand ischemia.  Cholecystitis was found on CTA.  She underwent a HIDA scan which was reassuring and she was treated conservatively with IV antibiotics and IV fluids.  She was seen in the ED 10/08/2022 for left arm swelling.  She had recently been admitted and discharged for AKI concerning for cholecystitis with a reassuring HIDA scan, treated with IV fluids and IV antibiotics.  Now presents with swelling in her left upper arm.  She states she had an IV removed at discharge and noticed some swelling in her left upper arm with associated pain.  No redness.  No fevers or chills.  No purulence.  On arrival, vitals are stable.  Differential diagnosis included possible cellulitis versus abscess.  Bedside ultrasound was performed which revealed cobblestoning concerning for fluid in the soft tissue.  No surrounding erythema.  As a precaution she was placed on antibiotics.  Recommended compression and elevation to the affected extremity.  She had also complained of some chest pain so an echocardiogram was ordered.  She was found to have LVEF 45 to 50%.  She did have some  regional wall motion abnormalities.  Because of this, a Lexiscan Myoview stress was ordered.  Fortunately, the study was normal and considered low risk.  EF estimated to be 62%.  Today, she has had nausea for 11 years but her sweats have gotten worse over the past year.  She has not had any chest pains, shortness of breath, racing heartbeat since her hospitalization.  Her biggest issue and her scoliosis and she saw surgeon for this shoulder that there is nothing they could do surgically.  Her pain starts back and wraps around the left side of her chest.  Reports no shortness of breath nor dyspnea on exertion. Reports no chest pain, pressure, or tightness. No edema, orthopnea, PND.  Past Medical History    Past Medical History:  Diagnosis Date   Arthritis    Depression    Fibromyalgia    Headache(784.0)    Hx of adenomatous polyp of colon 02/14/2020   Hypertension    Immune deficiency disorder ([place])    Migraine headache    Shingles    Stroke ([place])    Thrombocytopenia ([place]) 10/01/2012   Past Surgical History:  Procedure Laterality Date   ABDOMINAL HYSTERECTOMY     CARPAL TUNNEL RELEASE Left 09/25/2018   Procedure: LEFT CARPAL TUNNEL RELEASE;  Surgeon: Daryll Brod, MD;  Location: [facility];  Service: Orthopedics;  Laterality: Left;   CESAREAN SECTION  2008   x1   PARTIAL HYSTERECTOMY  2012   due to fibroid   UPPER GASTROINTESTINAL ENDOSCOPY  WISDOM TOOTH EXTRACTION  1995    Allergies  Allergies  Allergen Reactions   Trazodone And Nefazodone Nausea And Vomiting   Amoxicillin Other (See Comments)    Yeast infection    Trazodone Nausea And Vomiting   Carafate [Sucralfate] Nausea And Vomiting    EKGs/Labs/Other Studies Reviewed:   The following studies were reviewed today:  Echocardiogram 10/06/2022  IMPRESSIONS     1. No apparent apical thrombus noted. Left ventricular ejection fraction,  by estimation, is 45 to 50%. The left ventricle has mildly  decreased  function. The left ventricle demonstrates regional wall motion  abnormalities (see scoring diagram/findings for   description). Left ventricular diastolic parameters are indeterminate.   2. Right ventricular systolic function was not well visualized. The right  ventricular size is not well visualized.   3. The mitral valve is normal in structure. not assessed mitral valve  regurgitation. Not assessed mitral stenosis.   4. The aortic valve was not well visualized. Aortic valve regurgitation  Not assessed. not assessed.   Comparison(s): A prior study was performed on 05/06/2022. Wall motion  abnormailties are new.   FINDINGS   Left Ventricle: No apparent apical thrombus noted. Left ventricular  ejection fraction, by estimation, is 45 to 50%. The left ventricle has  mildly decreased function. The left ventricle demonstrates regional wall  motion abnormalities. Definity contrast  agent was given IV to delineate the left ventricular endocardial borders.  Left ventricular diastolic parameters are indeterminate.     LV Wall Scoring:  The mid anteroseptal segment, apical lateral segment, apical inferior  segment, and apex are akinetic. The entire anterior wall, antero-lateral  wall, inferior septum, inferior wall, posterior wall, and basal  anteroseptal  segment are hypokinetic.   Right Ventricle: The right ventricular size is not well visualized. Right  vetricular wall thickness was not assessed. Right ventricular systolic  function was not well visualized.   Left Atrium: Left atrial size was not assessed.   Right Atrium: Right atrial size was not assessed.   Mitral Valve: The mitral valve is normal in structure. Not assessed mitral  valve regurgitation. Not assessed mitral valve stenosis.   Tricuspid Valve: The tricuspid valve is not assessed.   Aortic Valve: The aortic valve was not well visualized. Aortic valve  regurgitation Not assessed. Not assessed.   Pulmonic  Valve: The pulmonic valve was not assessed.   Aorta: Aortic root could not be assessed.   IAS/Shunts: The interatrial septum was not assessed.    [place] 10/13/2022    The study is normal. The study is low risk.   No ST deviation was noted. TWI in leads III, aVF during stress   LV perfusion is normal. There is no evidence of ischemia. There is no evidence of infarction.   Left ventricular function is normal. Nuclear stress EF: 62 %. The left ventricular ejection fraction is normal (55-65%). End diastolic cavity size is normal. End systolic cavity size is normal.   Prior study not available for comparison.   1. Fixed apical inferior perfusion defect with normal wall motion, consistent with artifact 2. Low risk study    EKG:  EKG is not ordered today.   Recent Labs: 05/06/2022: B Natriuretic Peptide 134.2 10/06/2022: Magnesium 1.8 10/08/2022: ALT 15; BUN 14; Creatinine, Ser 1.49; Hemoglobin 11.9; Platelets 119; Potassium 3.4; Sodium 140  Recent Lipid Panel    Component Value Date/Time   CHOL 119 04/10/2019 1028   TRIG 56.0 04/10/2019 1028   HDL 39.70 04/10/2019  1028   CHOLHDL 3 04/10/2019 1028   VLDL 11.2 04/10/2019 1028   LDLCALC 68 04/10/2019 1028    Home Medications   Current Meds  Medication Sig   ALPRAZolam (XANAX) 1 MG tablet Take 1 mg by mouth 4 (four) times daily as needed for anxiety or sleep.    amitriptyline (ELAVIL) 25 MG tablet Take 2 tablets (50 mg total) by mouth daily.   amLODipine (NORVASC) 10 MG tablet Take 1 tablet (10 mg total) by mouth daily.   cloNIDine (CATAPRES - DOSED IN MG/24 HR) 0.2 mg/24hr patch Place 1 patch (0.2 mg total) onto the skin once a week.   cyclobenzaprine (FLEXERIL) 10 MG tablet Take 1 tablet (10 mg total) by mouth at bedtime as needed for muscle spasms.   EPINEPHrine (EPIPEN 2-PAK) 0.3 mg/0.3 mL IJ SOAJ injection Inject 0.3 mg into the muscle as needed for anaphylaxis.   etonogestrel (NEXPLANON) 68 MG IMPL implant Inject 1  each into the skin continuous. Reported on 01/27/2016   famotidine (PEPCID) 20 MG tablet TAKE 1 TABLET BY MOUTH 2 TIMES DAILY (Patient taking differently: Take 20 mg by mouth 2 (two) times daily.)   meclizine (ANTIVERT) 25 MG tablet Take 50 mg by mouth as needed for dizziness.   meloxicam (MOBIC) 7.5 MG tablet Take 7.5 mg by mouth 2 (two) times daily.   methocarbamol (ROBAXIN) 500 MG tablet Take 500 mg by mouth every 8 (eight) hours as needed.   metoCLOPramide (REGLAN) 10 MG tablet TAKE ONE TABLET BY MOUTH EVERY 6 HOURS AS NEEDED FOR NAUSEA OR VOMITING (Patient taking differently: Take 10 mg by mouth every 6 (six) hours as needed for vomiting or nausea.)   ondansetron (ZOFRAN) 8 MG tablet Take 8 mg by mouth 3 (three) times daily.   pantoprazole (PROTONIX) 40 MG tablet Take 1 tablet (40 mg total) by mouth daily.   promethazine (PHENERGAN) 25 MG suppository Place 1 suppository (25 mg total) rectally every 6 (six) hours as needed for nausea or vomiting.   valACYclovir (VALTREX) 1000 MG tablet Take 1 tablet (1,000 mg total) by mouth 3 (three) times daily.   valACYclovir (VALTREX) 500 MG tablet Take 500 mg by mouth daily.     Review of Systems      All other systems reviewed and are otherwise negative except as noted above.  Physical Exam    VS:  BP 120/70 (BP Location: Left Arm, Patient Position: Sitting, Cuff Size: Normal)   Pulse 85   Ht '5\' 1"'$  (1.549 m)   Wt 191 lb (86.6 kg)   LMP 10/16/2019 (Within Days) Comment: had hysterectomy-still has half of her uterus and ovaries  SpO2 98%   BMI 36.09 kg/m  , BMI Body mass index is 36.09 kg/m.  Wt Readings from Last 3 Encounters:  10/20/22 191 lb (86.6 kg)  10/13/22 184 lb (83.5 kg)  10/08/22 185 lb (83.9 kg)     GEN: Well nourished, well developed, in no acute distress. HEENT: normal. Neck: Supple, no JVD, carotid bruits, or masses. Cardiac: RRR, no murmurs, rubs, or gallops. No clubbing, cyanosis, edema.  Radials/PT 2+ and equal  bilaterally.  Respiratory:  Respirations regular and unlabored, clear to auscultation bilaterally. GI: Soft, nontender, nondistended. MS: No deformity or atrophy. Skin: Warm and dry, no rash. Neuro:  Strength and sensation are intact. Psych: Normal affect.  Assessment & Plan    Chest pain -goes from her back to her front and below her left breast, also has th sweats which concerns  her -Elevated troponins 134 peak, 59 at discharge -echo with slightly reduced EF, stress test normal -no revascularization indicated at that time  Hypertension -well controlled today -continue current medication regimen with clonidine and norvasc  Previous stroke -stable  Scoliosis -her biggest concern today which involves pain from her back which wraps around her left side and under her breast.  -recommended another surgical consult        Disposition: Follow up  3 months with Nelva Bush, MD or APP.  Signed, Elgie Collard, PA-C 10/20/2022, 4:23 PM [facility]

## 2022-10-20 ENCOUNTER — Encounter: Payer: Self-pay | Admitting: Physician Assistant

## 2022-10-20 ENCOUNTER — Ambulatory Visit: Payer: Medicare Other | Attending: Physician Assistant | Admitting: Physician Assistant

## 2022-10-20 VITALS — BP 120/70 | HR 85 | Ht 61.0 in | Wt 191.0 lb

## 2022-10-20 DIAGNOSIS — I672 Cerebral atherosclerosis: Secondary | ICD-10-CM

## 2022-10-20 DIAGNOSIS — R079 Chest pain, unspecified: Secondary | ICD-10-CM | POA: Diagnosis not present

## 2022-10-20 DIAGNOSIS — I10 Essential (primary) hypertension: Secondary | ICD-10-CM

## 2022-10-20 DIAGNOSIS — Z8673 Personal history of transient ischemic attack (TIA), and cerebral infarction without residual deficits: Secondary | ICD-10-CM | POA: Diagnosis not present

## 2022-10-20 NOTE — Patient Instructions (Signed)
Medication Instructions:  Your physician recommends that you continue on your current medications as directed. Please refer to the Current Medication list given to you today.  *If you need a refill on your cardiac medications before your next appointment, please call your pharmacy*   Lab Work: None If you have labs (blood work) drawn today and your tests are completely normal, you will receive your results only by: [place] (if you have MyChart) OR A paper copy in the mail If you have any lab test that is abnormal or we need to change your treatment, we will call you to review the results.   Follow-Up: At [facility], you and your health needs are our priority.  As part of our continuing mission to provide you with exceptional heart care, we have created designated Provider Care Teams.  These Care Teams include your primary Cardiologist (physician) and Advanced Practice Providers (APPs -  Physician Assistants and Nurse Practitioners) who all work together to provide you with the care you need, when you need it.   Your next appointment:   3 month(s)  The format for your next appointment:   In Person  Provider:   Nelva Bush, MD   Other Instructions Check your blood pressure daily, one hour after your morning medications and keep a log of your readings  Important Information About Sugar

## 2022-11-03 ENCOUNTER — Encounter: Payer: Self-pay | Admitting: Family Medicine

## 2022-11-03 ENCOUNTER — Ambulatory Visit (INDEPENDENT_AMBULATORY_CARE_PROVIDER_SITE_OTHER): Payer: Medicare Other | Admitting: Family Medicine

## 2022-11-03 VITALS — BP 122/76 | HR 80 | Temp 97.8°F | Ht 61.0 in | Wt 188.1 lb

## 2022-11-03 DIAGNOSIS — I10 Essential (primary) hypertension: Secondary | ICD-10-CM

## 2022-11-03 DIAGNOSIS — R1115 Cyclical vomiting syndrome unrelated to migraine: Secondary | ICD-10-CM | POA: Diagnosis not present

## 2022-11-03 DIAGNOSIS — N179 Acute kidney failure, unspecified: Secondary | ICD-10-CM

## 2022-11-03 DIAGNOSIS — M4125 Other idiopathic scoliosis, thoracolumbar region: Secondary | ICD-10-CM

## 2022-11-03 DIAGNOSIS — N83209 Unspecified ovarian cyst, unspecified side: Secondary | ICD-10-CM | POA: Diagnosis not present

## 2022-11-03 DIAGNOSIS — K828 Other specified diseases of gallbladder: Secondary | ICD-10-CM

## 2022-11-03 LAB — CBC WITH DIFFERENTIAL/PLATELET
Basophils Absolute: 0 10*3/uL (ref 0.0–0.1)
Basophils Relative: 0.4 % (ref 0.0–3.0)
Eosinophils Absolute: 0.1 10*3/uL (ref 0.0–0.7)
Eosinophils Relative: 1.1 % (ref 0.0–5.0)
HCT: 38.4 % (ref 36.0–46.0)
Hemoglobin: 13 g/dL (ref 12.0–15.0)
Lymphocytes Relative: 36 % (ref 12.0–46.0)
Lymphs Abs: 2.1 10*3/uL (ref 0.7–4.0)
MCHC: 34 g/dL (ref 30.0–36.0)
MCV: 98.8 fl (ref 78.0–100.0)
Monocytes Absolute: 0.3 10*3/uL (ref 0.1–1.0)
Monocytes Relative: 5 % (ref 3.0–12.0)
Neutro Abs: 3.3 10*3/uL (ref 1.4–7.7)
Neutrophils Relative %: 57.5 % (ref 43.0–77.0)
Platelets: 166 10*3/uL (ref 150.0–400.0)
RBC: 3.88 Mil/uL (ref 3.87–5.11)
RDW: 14.4 % (ref 11.5–15.5)
WBC: 5.7 10*3/uL (ref 4.0–10.5)

## 2022-11-03 LAB — BASIC METABOLIC PANEL
BUN: 10 mg/dL (ref 6–23)
CO2: 29 mEq/L (ref 19–32)
Calcium: 9.4 mg/dL (ref 8.4–10.5)
Chloride: 104 mEq/L (ref 96–112)
Creatinine, Ser: 0.88 mg/dL (ref 0.40–1.20)
GFR: 75.72 mL/min (ref >60.00)
Glucose, Bld: 91 mg/dL (ref 70–99)
Potassium: 3.4 mEq/L — ABNORMAL LOW (ref 3.5–5.1)
Sodium: 139 mEq/L (ref 135–145)

## 2022-11-03 NOTE — Patient Instructions (Signed)
Take the CT report to your gyn for your regular visit re: the ovarian cyst   Keep drinking fluids Lab today for bmet and cbc   Eat a healthy diet  Take care of yourself   I want to refer you to someone for scoliosis at [facility] if possible  If you don't hear in 1-2 weeks let us know

## 2022-11-03 NOTE — Progress Notes (Signed)
Subjective:    Patient ID: Dana Greer, female    DOB: November 21, 1970, 52 y.o.   MRN: [id number]  HPI Pt presents for f/u of multiple med probs incl HTN and n/v, back pain and chest pain  Wt Readings from Last 3 Encounters:  11/03/22 188 lb 2 oz (85.3 kg)  10/20/22 191 lb (86.6 kg)  10/13/22 184 lb (83.5 kg)   35.55 kg/m  HTN bp is stable today  No cp or palpitations or headaches or edema  No side effects to medicines  BP Readings from Last 3 Encounters:  11/03/22 122/76  10/20/22 120/70  10/08/22 120/85    Better control  Clonidine Amlodipine    Had hosp from 12/6 to 12/9 for n/v with aki Presented with cp and back pain and abd pain as well as sweating   AKI improved and thought due to dehydration  Lab Results  Component Value Date   CREATININE 1.49 (H) 10/08/2022   BUN 14 10/08/2022   NA 140 10/08/2022   K 3.4 (L) 10/08/2022   CL 108 10/08/2022   CO2 23 10/08/2022    HTN urgency tx with amlodipine 10  N/v/abd pain  Tx with ppi  ? If poss abd migraine -did order porphyrin and porophobilogen to r/u intermittent porphyria  Gb wall appeared thickened but HIDA nl  Lactic acidosis resolved  K supplemented Mg was nl   Noted 3.1 cm ov cyst on CT  She [facility] PT recommended Ref to Dr Patrice Paradise to disc poss scoliosis surgery    She had a visit to cardiology on 12/21 for cp  Chest pain -goes from her back to her front and below her left breast, also has th sweats which concerns her -Elevated troponins 134 peak, 59 at discharge -echo with slightly reduced EF, stress test normal -no revascularization indicated at that timeA/p  Noted scoliosis may be biggest cause of back and chest pain    Lab Results  Component Value Date   ALT 15 10/08/2022   AST 17 10/08/2022   ALKPHOS 31 (L) 10/08/2022   BILITOT 0.8 10/08/2022   Lab Results  Component Value Date   WBC 5.3 10/08/2022   HGB 11.9 (L) 10/08/2022   HCT  35.0 (L) 10/08/2022   MCV 99.4 10/08/2022   PLT 119 (L) 10/08/2022    Today feels better overall   No more n/v since last visit  Not really using the metoclopramide as needed  Zofran as needed - does not always work  Phenergan as needed    Sweating is the same  She would like to see another scoliosis specialist   Did see the surgeon- Dr Patrice Paradise  He was not as concerned with the scoliosis but thought the arthritis was more of the problem  He px meloxicam and a muscle relaxer -both make her sleepy  Decided not to take them -would rather suffer  Wants to see another spine specialist - is interested in [place]   Pain is constant  In thoracic area Some sciatic symptoms on L on and off-can get really bad  Some days she can function some days she cannot    Fluid intake is better  Drinks a lot now - fluids   Patient Active Problem List   Diagnosis Date Noted   Elevated troponin 10/05/2022   Thickening of wall of gallbladder 10/05/2022   Anxiety 10/05/2022   Scoliosis 09/07/2022   Microscopic hematuria 07/05/2022  Dysphagia 05/16/2022   Rhabdomyolysis 05/05/2022   Heat exhaustion, unspecified, sequela 05/05/2022   Generalized weakness 05/05/2022   Obesity (BMI 30-39.9)    Rash and nonspecific skin eruption 11/15/2021   Perioral dermatitis 11/02/2021   Mixed incontinence 08/16/2021   Elevated serum creatinine 08/12/2021   Urinary frequency 08/12/2021   Herpes zoster without complication 51/76/1607   Hand pain, left 10/14/2020   Swelling of hand joint, left 10/14/2020   TMJ (temporomandibular joint syndrome) 08/18/2020   Non-intractable cyclical vomiting 37/07/6268   Left-sided back pain 02/25/2020   Hx of adenomatous polyp of colon 02/14/2020   Seasonal allergic rhinitis 01/21/2020   Chronic cough 10/04/2019   Headache, post-traumatic, acute 01/28/2019   Head injury, closed 01/17/2019   Abnormal vaginal bleeding 07/06/2018   Neck pain 06/25/2018   Migraine,  chronic, without aura 04/12/2018   B12 deficiency 01/10/2018   Foot pain, bilateral 01/09/2018   Hand paresthesia 01/09/2018   Vitamin D deficiency 01/09/2018   Upper abdominal pain 11/22/2016   AKI (acute kidney injury) ([place]) 11/22/2016   Chronic myofascial pain 10/26/2016   Encounter for screening mammogram for breast cancer 10/25/2016   HSV-2 infection 08/17/2016   Adverse effects of medication 11/25/2015   Vertigo 10/08/2015   Carpal tunnel syndrome 03/16/2015   Chronic nausea 02/17/2015   Gout 12/22/2014   Vertiginous migraine 02/03/2014   Intractable migraine 08/31/2012   Osteoarthritis 05/11/2012   Female bladder prolapse 05/11/2012   ADJ DISORDER WITH MIXED ANXIETY & DEPRESSED MOOD 06/30/2008   HYPERHIDROSIS 01/25/2008   BACK PAIN, LUMBAR, CHRONIC 10/16/2007   REACTION, ACUTE STRESS W/EMOTIONAL DSTURB 08/01/2007   Prediabetes 08/01/2007   Hypertension 08/01/2007   Goiter 07/11/2007   POLYCYSTIC OVARIAN DISEASE 05/24/2007   HIRSUTISM 05/24/2007   Arthropathy, multiple sites 05/24/2007   Past Medical History:  Diagnosis Date   Arthritis    Depression    Fibromyalgia    Headache(784.0)    Hx of adenomatous polyp of colon 02/14/2020   Hypertension    Immune deficiency disorder ([place])    Migraine headache    Shingles    Stroke ([place])    Thrombocytopenia ([place]) 10/01/2012   Past Surgical History:  Procedure Laterality Date   ABDOMINAL HYSTERECTOMY     CARPAL TUNNEL RELEASE Left 09/25/2018   Procedure: LEFT CARPAL TUNNEL RELEASE;  Surgeon: Daryll Brod, MD;  Location: [facility];  Service: Orthopedics;  Laterality: Left;   CESAREAN SECTION  2008   x1   PARTIAL HYSTERECTOMY  2012   due to fibroid   UPPER GASTROINTESTINAL ENDOSCOPY     WISDOM TOOTH EXTRACTION  1995   Social History   Tobacco Use   Smoking status: Never   Smokeless tobacco: Never  Vaping Use   Vaping Use: Never used  Substance Use Topics   Alcohol use: Yes    Comment: occasional    Drug use: Yes    Types: Marijuana    Comment: smokes small amount twice daily   Family History  Problem Relation Age of Onset   Healthy Mother    Diabetes Father    COPD Father    Breast cancer Paternal Aunt    Lung cancer Paternal Aunt    Lung cancer Paternal Uncle    Breast cancer Paternal Grandmother    Colon cancer Paternal Grandmother    Rectal cancer Paternal Grandmother    Esophageal cancer Cousin    Stomach cancer Neg Hx    Allergies  Allergen Reactions   Trazodone And Nefazodone  Nausea And Vomiting   Amoxicillin Other (See Comments)    Yeast infection    Trazodone Nausea And Vomiting   Carafate [Sucralfate] Nausea And Vomiting   Current Outpatient Medications on File Prior to Visit  Medication Sig Dispense Refill   ALPRAZolam (XANAX) 1 MG tablet Take 1 mg by mouth 4 (four) times daily as needed for anxiety or sleep.      amitriptyline (ELAVIL) 25 MG tablet Take 2 tablets (50 mg total) by mouth daily. 60 tablet 0   amLODipine (NORVASC) 10 MG tablet Take 1 tablet (10 mg total) by mouth daily. 30 tablet 0   cloNIDine (CATAPRES - DOSED IN MG/24 HR) 0.2 mg/24hr patch Place 1 patch (0.2 mg total) onto the skin once a week. 4 patch 5   cyclobenzaprine (FLEXERIL) 10 MG tablet Take 1 tablet (10 mg total) by mouth at bedtime as needed for muscle spasms. 30 tablet 3   EPINEPHrine (EPIPEN 2-PAK) 0.3 mg/0.3 mL IJ SOAJ injection Inject 0.3 mg into the muscle as needed for anaphylaxis. 2 each 1   etonogestrel (NEXPLANON) 68 MG IMPL implant Inject 1 each into the skin continuous. Reported on 01/27/2016     famotidine (PEPCID) 20 MG tablet TAKE 1 TABLET BY MOUTH 2 TIMES DAILY (Patient taking differently: Take 20 mg by mouth 2 (two) times daily.) 60 tablet 3   meclizine (ANTIVERT) 25 MG tablet Take 50 mg by mouth as needed for dizziness.     metoCLOPramide (REGLAN) 10 MG tablet TAKE ONE TABLET BY MOUTH EVERY 6 HOURS AS NEEDED FOR NAUSEA OR VOMITING (Patient taking differently: Take 10  mg by mouth every 6 (six) hours as needed for vomiting or nausea.) 60 tablet 0   ondansetron (ZOFRAN) 8 MG tablet Take 8 mg by mouth 3 (three) times daily.     pantoprazole (PROTONIX) 40 MG tablet Take 1 tablet (40 mg total) by mouth daily. 30 tablet 0   promethazine (PHENERGAN) 25 MG suppository Place 1 suppository (25 mg total) rectally every 6 (six) hours as needed for nausea or vomiting. 20 each 0   valACYclovir (VALTREX) 500 MG tablet Take 500 mg by mouth daily.     No current facility-administered medications on file prior to visit.      Review of Systems  Constitutional:  Positive for fatigue. Negative for activity change, appetite change, fever and unexpected weight change.  HENT:  Negative for congestion, ear pain, rhinorrhea, sinus pressure and sore throat.   Eyes:  Negative for pain, redness and visual disturbance.  Respiratory:  Negative for cough, shortness of breath and wheezing.   Cardiovascular:  Negative for chest pain and palpitations.  Gastrointestinal:  Negative for abdominal pain, blood in stool, constipation and diarrhea.       No nausea today  Endocrine: Negative for polydipsia and polyuria.       Excessive sweating baseline  Genitourinary:  Negative for dysuria, frequency and urgency.  Musculoskeletal:  Positive for back pain. Negative for arthralgias and myalgias.  Skin:  Negative for pallor and rash.  Allergic/Immunologic: Negative for environmental allergies.  Neurological:  Negative for dizziness, syncope and headaches.  Hematological:  Negative for adenopathy. Does not bruise/bleed easily.  Psychiatric/Behavioral:  Negative for decreased concentration and dysphoric mood. The patient is not nervous/anxious.        Objective:   Physical Exam Constitutional:      General: She is not in acute distress.    Appearance: Normal appearance. She is well-developed. She is obese. She is  not ill-appearing or diaphoretic.  HENT:     Head: Normocephalic and  atraumatic.     Mouth/Throat:     Mouth: Mucous membranes are moist.  Eyes:     General: No scleral icterus.    Conjunctiva/sclera: Conjunctivae normal.     Pupils: Pupils are equal, round, and reactive to light.  Neck:     Thyroid: No thyromegaly.     Vascular: No carotid bruit or JVD.  Cardiovascular:     Rate and Rhythm: Normal rate and regular rhythm.     Heart sounds: Normal heart sounds.     No gallop.  Pulmonary:     Effort: Pulmonary effort is normal. No respiratory distress.     Breath sounds: Normal breath sounds. No wheezing or rales.  Abdominal:     General: Bowel sounds are normal. There is no distension or abdominal bruit.     Palpations: Abdomen is soft. There is no mass.     Tenderness: There is no abdominal tenderness. There is no guarding or rebound. Negative signs include Murphy's sign.  Musculoskeletal:     Cervical back: Normal range of motion and neck supple.     Right lower leg: No edema.     Left lower leg: No edema.     Comments: Mod to severe thoracolumbar scoliosis  Tender lumbar musculature Neg slr  Lymphadenopathy:     Cervical: No cervical adenopathy.  Skin:    General: Skin is warm and dry.     Coloration: Skin is not pale.     Findings: No rash.  Neurological:     Mental Status: She is alert.     Coordination: Coordination normal.     Deep Tendon Reflexes: Reflexes are normal and symmetric. Reflexes normal.  Psychiatric:        Mood and Affect: Mood normal.           Assessment & Plan:   Problem List Items Addressed This Visit       Cardiovascular and Mediastinum   Hypertension - Primary    Was adm to hosp with Hypertensive urgency in setting of pain /distress  Improved now with addn of amlodpine 10 mg (she tolerates)  bp is ok today BP: 122/76  Continues the clonidine patch 0.2 mg weekly       Relevant Orders   Basic metabolic panel (Completed)   CBC with Differential/Platelet (Completed)     Digestive    Non-intractable cyclical vomiting (Chronic)    Was not vomiting this hospitalization but did become dehydrated Using reglan sparingly        Endocrine   Ovarian cyst    3.1 cm right  ovarian cyst noted on CT Incidental/no symptoms Recommended f/u 6-12 mo with [place] Pt has upcoming gyn visit and plans to discuss there Has h/o pcos   Watching for symptoms/pelvic pain  Pt given copy of report to take to gyn        Musculoskeletal and Integument   Scoliosis    Mod to severe scoliosis with chronic pain  Pt [facility] and then f/u with Dr Patrice Paradise who indicated surgery is not warranted  She would like a 2nd opinion as well Desires ref to [employer]  Will refer      Relevant Orders   Ambulatory referral to Orthopedic Surgery     Genitourinary   AKI (acute kidney injury) ([place])    Recent hospitalization  Due to dehydration most likely  Reviewed hospital records, lab results and studies in detail   Improved at d/c  Lab today  Good fluid intake       Relevant Orders   Basic metabolic panel (Completed)     Other   Thickening of wall of gallbladder    Thickened gb wall on CT in hospital but neg hida scan Pt continues to have episodic n/v and abd discomfort

## 2022-11-05 DIAGNOSIS — N83209 Unspecified ovarian cyst, unspecified side: Secondary | ICD-10-CM | POA: Insufficient documentation

## 2022-11-05 NOTE — Assessment & Plan Note (Signed)
3.1 cm right  ovarian cyst noted on CT Incidental/no symptoms Recommended f/u 6-12 mo with [place] Pt has upcoming gyn visit and plans to discuss there Has h/o pcos   Watching for symptoms/pelvic pain  Pt given copy of report to take to gyn

## 2022-11-05 NOTE — Assessment & Plan Note (Signed)
Mod to severe scoliosis with chronic pain  Pt [facility] and then f/u with Dr Patrice Paradise who indicated surgery is not warranted  She would like a 2nd opinion as well Desires ref to [employer]  Will refer

## 2022-11-05 NOTE — Assessment & Plan Note (Signed)
Thickened gb wall on CT in hospital but neg hida scan Pt continues to have episodic n/v and abd discomfort

## 2022-11-05 NOTE — Assessment & Plan Note (Signed)
Was not vomiting this hospitalization but did become dehydrated Using reglan sparingly

## 2022-11-05 NOTE — Assessment & Plan Note (Signed)
Recent hospitalization  Due to dehydration most likely  Reviewed hospital records, lab results and studies in detail   Improved at d/c  Lab today  Good fluid intake

## 2022-11-05 NOTE — Assessment & Plan Note (Signed)
Was adm to hosp with Hypertensive urgency in setting of pain /distress  Improved now with addn of amlodpine 10 mg (she tolerates)  bp is ok today BP: 122/76  Continues the clonidine patch 0.2 mg weekly

## 2022-11-08 ENCOUNTER — Encounter: Payer: Self-pay | Admitting: *Deleted

## 2022-12-06 ENCOUNTER — Other Ambulatory Visit: Payer: Self-pay | Admitting: *Deleted

## 2022-12-06 MED ORDER — AMLODIPINE BESYLATE 10 MG PO TABS: 10.0000 mg | ORAL_TABLET | Freq: Every day | ORAL | 3 refills | Status: DC

## 2022-12-06 MED ORDER — PANTOPRAZOLE SODIUM 40 MG PO TBEC: 40.0000 mg | DELAYED_RELEASE_TABLET | Freq: Every day | ORAL | 3 refills | Status: DC

## 2022-12-06 NOTE — Telephone Encounter (Signed)
Looks like meds were filled last at hospital on 10/08/22. Last OV was 11/03/22, please advise

## 2022-12-10 ENCOUNTER — Encounter: Payer: Self-pay | Admitting: Family Medicine

## 2023-01-05 ENCOUNTER — Encounter: Payer: Self-pay | Admitting: Family Medicine

## 2023-01-05 DIAGNOSIS — B029 Zoster without complications: Secondary | ICD-10-CM

## 2023-01-05 DIAGNOSIS — R208 Other disturbances of skin sensation: Secondary | ICD-10-CM

## 2023-01-08 DIAGNOSIS — R208 Other disturbances of skin sensation: Secondary | ICD-10-CM | POA: Insufficient documentation

## 2023-01-22 ENCOUNTER — Telehealth: Payer: Self-pay | Admitting: Family Medicine

## 2023-01-22 NOTE — Telephone Encounter (Signed)
Note rec from referrals    ----- Message ----- From: Anette Guarneri Sent: 01/11/2023   3:25 PM EDT To: Elvina Mattes R YoungBlood   Hey [name],   It's Nikki with [facility]. It looks like [name] is already established with Neurologist Dr. Boyd Kerbs and saw him just last year. For continuity of care, it would be best for her to follow up with them on this, unless there's a reason she's not wanting to back.   Just let me know-thanks!   Please ask pt what she wants to do regarding current neurologist vs new one Thanks

## 2023-01-23 NOTE — Telephone Encounter (Signed)
Patient would prefer to see a new Neurologist, she had a bad experience with the previous one.   She would like to go see:  Dr. Pete Glatter. [address], [place], [place], [place] [address]

## 2023-01-25 NOTE — Telephone Encounter (Signed)
See referral notes for updates  This has been updated in the referral and notes to be sent to new Neurologist listed below.

## 2023-01-30 ENCOUNTER — Ambulatory Visit: Payer: Medicare Other

## 2023-01-30 ENCOUNTER — Ambulatory Visit
Admission: RE | Admit: 2023-01-30 | Discharge: 2023-01-30 | Disposition: A | Discharge status: Home / Self Care | Payer: Medicare Other | Source: Ambulatory Visit | Attending: Family Medicine | Admitting: Family Medicine

## 2023-01-30 ENCOUNTER — Other Ambulatory Visit: Payer: Self-pay | Admitting: Family Medicine

## 2023-01-30 DIAGNOSIS — R928 Other abnormal and inconclusive findings on diagnostic imaging of breast: Secondary | ICD-10-CM | POA: Diagnosis not present

## 2023-01-30 DIAGNOSIS — N6320 Unspecified lump in the left breast, unspecified quadrant: Secondary | ICD-10-CM

## 2023-02-01 ENCOUNTER — Telehealth: Payer: Self-pay | Admitting: Internal Medicine

## 2023-02-01 ENCOUNTER — Ambulatory Visit: Payer: Medicare Other | Admitting: Internal Medicine

## 2023-02-01 NOTE — Telephone Encounter (Signed)
That is fine with me.  Carley Glendenning, MD [facility]  

## 2023-02-01 NOTE — Telephone Encounter (Signed)
This patient would like to do a provider switch from Dr. Saunders Revel to Dr. Rockey Situ. Would both of you be okay with the provider switch?

## 2023-02-08 ENCOUNTER — Other Ambulatory Visit: Payer: Self-pay | Admitting: Family Medicine

## 2023-02-08 NOTE — Telephone Encounter (Signed)
On list as a historical entry but last filled by PCP on 08/24/22 #21 tabs/ 0 refills. Last OV was 11/03/22

## 2023-02-09 ENCOUNTER — Ambulatory Visit (INDEPENDENT_AMBULATORY_CARE_PROVIDER_SITE_OTHER): Payer: Medicare Other

## 2023-02-09 ENCOUNTER — Ambulatory Visit (INDEPENDENT_AMBULATORY_CARE_PROVIDER_SITE_OTHER): Payer: Medicare Other | Admitting: Orthopaedic Surgery

## 2023-02-09 ENCOUNTER — Other Ambulatory Visit (HOSPITAL_BASED_OUTPATIENT_CLINIC_OR_DEPARTMENT_OTHER): Payer: Self-pay

## 2023-02-09 DIAGNOSIS — G8929 Other chronic pain: Secondary | ICD-10-CM

## 2023-02-09 DIAGNOSIS — M545 Low back pain, unspecified: Secondary | ICD-10-CM | POA: Diagnosis not present

## 2023-02-09 DIAGNOSIS — M4316 Spondylolisthesis, lumbar region: Secondary | ICD-10-CM | POA: Diagnosis not present

## 2023-02-09 MED ORDER — METHYLPREDNISOLONE 4 MG PO TBPK
ORAL_TABLET | ORAL | 0 refills | Status: DC
  Filled 2023-02-09: qty 21, 6d supply, fill #0

## 2023-02-09 NOTE — Addendum Note (Signed)
Addended by: Benancio Deeds on: 02/09/2023 10:46 AM   Modules accepted: Orders

## 2023-02-09 NOTE — Progress Notes (Signed)
Chief Complaint: Radiating lumbar back pain     History of Present Illness:    Dana Greer is a 52 y.o. female with lower back pain which has been chronic for the last several years although this is now radiating down bilateral lower extremities.  She states that this is worse with any type of activity and she is now in excruciating pain really with any type of attempt to be active.  She has previously taken NSAIDs as well as Bengay and Tiger balm.  She has done physical therapy for her back.  She does have a known history of scoliosis.  She has some pain with increased flexion and forward sitting    Surgical History:   None  PMH/PSH/Family History/Social History/Meds/Allergies:    Past Medical History:  Diagnosis Date   Arthritis    Depression    Fibromyalgia    Headache(784.0)    Hx of adenomatous polyp of colon 02/14/2020   Hypertension    Immune deficiency disorder ([facility])    Migraine headache    Shingles    Stroke ([facility])    Thrombocytopenia ([facility]) 10/01/2012   Past Surgical History:  Procedure Laterality Date   ABDOMINAL HYSTERECTOMY     CARPAL TUNNEL RELEASE Left 09/25/2018   Procedure: LEFT CARPAL TUNNEL RELEASE;  Surgeon: Cindee SaltKuzma, Gary, MD;  Location: [facility];  Service: Orthopedics;  Laterality: Left;   CESAREAN SECTION  2008   x1   PARTIAL HYSTERECTOMY  2012   due to fibroid   UPPER GASTROINTESTINAL ENDOSCOPY     WISDOM TOOTH EXTRACTION  1995   Social History   Socioeconomic History   Marital status: Legally Separated    Spouse name: Not on file   Number of children: 3   Years of education: college   Highest education level: Not on file  Occupational History   Occupation: Unemployed  Tobacco Use   Smoking status: Never   Smokeless tobacco: Never  Vaping Use   Vaping Use: Never used  Substance and Sexual Activity   Alcohol use: Yes    Comment: occasional   Drug use: Yes    Types: Marijuana     Comment: smokes small amount twice daily   Sexual activity: Not Currently    Birth control/protection: None  Other Topics Concern   Not on file  Social History Narrative   Lives at home with her children.   6 cups caffeine per day.   Right-handed.   Social Determinants of Health   Financial Resource Strain: High Risk (08/15/2022)   Overall Financial Resource Strain (CARDIA)    Difficulty of Paying Living Expenses: Hard  Food Insecurity: No Food Insecurity (10/06/2022)   Hunger Vital Sign    Worried About Running Out of Food in the Last Year: Never true    Ran Out of Food in the Last Year: Never true  Recent Concern: Food Insecurity - Food Insecurity Present (08/17/2022)   Hunger Vital Sign    Worried About Running Out of Food in the Last Year: Sometimes true    Ran Out of Food in the Last Year: Sometimes true  Transportation Needs: No Transportation Needs (10/06/2022)   PRAPARE - Administrator, Civil ServiceTransportation    Lack of Transportation (Medical): No    Lack of Transportation (Non-Medical): No  Physical Activity: Inactive (08/15/2022)  Exercise Vital Sign    Days of Exercise per Week: 0 days    Minutes of Exercise per Session: 0 min  Stress: No Stress Concern Present (08/15/2022)   [employer] of Occupational Health - Occupational Stress Questionnaire    Feeling of Stress : Only a little  Social Connections: Moderately Isolated (08/15/2022)   Social Connection and Isolation Panel [NHANES]    Frequency of Communication with Friends and Family: More than three times a week    Frequency of Social Gatherings with Friends and Family: Three times a week    Attends Religious Services: More than 4 times per year    Active Member of Clubs or Organizations: No    Attends Banker Meetings: Never    Marital Status: Separated   Family History  Problem Relation Age of Onset   Healthy Mother    Diabetes Father    COPD Father    Breast cancer Paternal Aunt    Lung cancer Paternal  Aunt    Lung cancer Paternal Uncle    Breast cancer Paternal Grandmother    Colon cancer Paternal Grandmother    Rectal cancer Paternal Grandmother    Esophageal cancer Cousin    Stomach cancer Neg Hx    Allergies  Allergen Reactions   Trazodone And Nefazodone Nausea And Vomiting   Amoxicillin Other (See Comments)    Yeast infection    Trazodone Nausea And Vomiting   Carafate [Sucralfate] Nausea And Vomiting   Current Outpatient Medications  Medication Sig Dispense Refill   ALPRAZolam (XANAX) 1 MG tablet Take 1 mg by mouth 4 (four) times daily as needed for anxiety or sleep.      amitriptyline (ELAVIL) 25 MG tablet Take 2 tablets (50 mg total) by mouth daily. 60 tablet 0   amLODipine (NORVASC) 10 MG tablet Take 1 tablet (10 mg total) by mouth daily. 90 tablet 3   cloNIDine (CATAPRES - DOSED IN MG/24 HR) 0.2 mg/24hr patch Place 1 patch (0.2 mg total) onto the skin once a week. 4 patch 5   cyclobenzaprine (FLEXERIL) 10 MG tablet Take 1 tablet (10 mg total) by mouth at bedtime as needed for muscle spasms. 30 tablet 3   EPINEPHrine (EPIPEN 2-PAK) 0.3 mg/0.3 mL IJ SOAJ injection Inject 0.3 mg into the muscle as needed for anaphylaxis. 2 each 1   etonogestrel (NEXPLANON) 68 MG IMPL implant Inject 1 each into the skin continuous. Reported on 01/27/2016     famotidine (PEPCID) 20 MG tablet TAKE 1 TABLET BY MOUTH 2 TIMES DAILY (Patient taking differently: Take 20 mg by mouth 2 (two) times daily.) 60 tablet 3   meclizine (ANTIVERT) 25 MG tablet Take 50 mg by mouth as needed for dizziness.     metoCLOPramide (REGLAN) 10 MG tablet TAKE ONE TABLET BY MOUTH EVERY 6 HOURS AS NEEDED FOR NAUSEA OR VOMITING (Patient taking differently: Take 10 mg by mouth every 6 (six) hours as needed for vomiting or nausea.) 60 tablet 0   ondansetron (ZOFRAN) 8 MG tablet Take 8 mg by mouth 3 (three) times daily.     pantoprazole (PROTONIX) 40 MG tablet Take 1 tablet (40 mg total) by mouth daily. 90 tablet 3    promethazine (PHENERGAN) 25 MG suppository Place 1 suppository (25 mg total) rectally every 6 (six) hours as needed for nausea or vomiting. 20 each 0   valACYclovir (VALTREX) 500 MG tablet TAKE ONE TABLET (500 mg) BY MOUTH EVERY DAY 30 tablet 0   No current  facility-administered medications for this visit.   No results found.  Review of Systems:   A ROS was performed including pertinent positives and negatives as documented in the HPI.  Physical Exam :   Constitutional: NAD and appears stated age Neurological: Alert and oriented Psych: Appropriate affect and cooperative Last menstrual period 10/16/2019.   Comprehensive Musculoskeletal Exam:    She has a positive radicular pain with forward flexion of the spine in the sitting position.  Positive straight leg raise bilaterally.  She does have some scoliotic curvature on standing.  She has no palpable or discernible weakness in any motor groups of the bilateral lower extremities  Imaging:   Xray (4 views lumbar spine): There is a thoracolumbar scoliotic curve with evidence of degenerative disc disease at multiple levels   I personally reviewed and interpreted the radiographs.   Assessment:   52 y.o. female with likely lumbar radiculitis in the setting of significant scoliotic curvature.  At today's visit I have advised that given the fact that she has tried multiple medical managements including activity restriction and physical therapy I would recommend a lumbar spine MRI.  I will plan to see her back following this we will discuss results.  Plan :    -Return to clinic following MRI to discuss results     I personally saw and evaluated the patient, and participated in the management and treatment plan.  Huel Cote, MD Attending Physician, Orthopedic Surgery  This document was dictated using Dragon voice recognition software. A reasonable attempt at proof reading has been made to minimize errors.

## 2023-02-17 ENCOUNTER — Encounter: Payer: Self-pay | Admitting: Family Medicine

## 2023-02-22 NOTE — Telephone Encounter (Signed)
I called the patient in response to her Mychart message (see mychart) and explained to her that we have sent her referral to the location that she requested, [facility] in [place].  She stated that they have her referral but told her that they cannot schedule her until they are through reviewing all her records. I advised that I would call and follow up.   I called [facility] and they stated that given what she was referred for Dr Ellan Lambert for, he needed to thoroughly review her records prior to scheduling a visit. They stated that the reason for this is so that they do not waste her time bringing her in for a visit and her being told, " we cannot see you, you will need to be seen somewhere else for this." They stated that they tried to explain that to her as well. I was told that he should be done reviewing the notes today and they should be calling her with an answer at latest tomorrow 4/25. If not able to see her they are hoping to be able to recommend somewhere that can best treat her.   I called the patient back and explained everything to her and advised her that if she does not have an answer from them by Noon on Friday 4/26 to call me back. The patient expressed understanding and stated that she would let me know if she does not hear from them.

## 2023-02-24 ENCOUNTER — Ambulatory Visit
Admission: RE | Admit: 2023-02-24 | Discharge: 2023-02-24 | Disposition: A | Discharge status: Home / Self Care | Payer: Medicare Other | Source: Ambulatory Visit | Attending: Orthopaedic Surgery | Admitting: Orthopaedic Surgery

## 2023-02-24 DIAGNOSIS — M5430 Sciatica, unspecified side: Secondary | ICD-10-CM | POA: Diagnosis not present

## 2023-02-24 DIAGNOSIS — G8929 Other chronic pain: Secondary | ICD-10-CM

## 2023-03-08 DIAGNOSIS — G5603 Carpal tunnel syndrome, bilateral upper limbs: Secondary | ICD-10-CM | POA: Diagnosis not present

## 2023-03-08 DIAGNOSIS — M79644 Pain in right finger(s): Secondary | ICD-10-CM | POA: Diagnosis not present

## 2023-03-09 ENCOUNTER — Emergency Department (HOSPITAL_COMMUNITY): Payer: Medicare Other

## 2023-03-09 ENCOUNTER — Encounter (HOSPITAL_COMMUNITY): Payer: Self-pay

## 2023-03-09 ENCOUNTER — Emergency Department (HOSPITAL_COMMUNITY)
Admission: EM | Admit: 2023-03-09 | Discharge: 2023-03-09 | Disposition: A | Discharge status: Home / Self Care | Payer: Medicare Other | Attending: Emergency Medicine | Admitting: Emergency Medicine

## 2023-03-09 ENCOUNTER — Other Ambulatory Visit: Payer: Self-pay

## 2023-03-09 DIAGNOSIS — Z79899 Other long term (current) drug therapy: Secondary | ICD-10-CM | POA: Diagnosis not present

## 2023-03-09 DIAGNOSIS — R531 Weakness: Secondary | ICD-10-CM | POA: Diagnosis not present

## 2023-03-09 DIAGNOSIS — I10 Essential (primary) hypertension: Secondary | ICD-10-CM | POA: Diagnosis not present

## 2023-03-09 DIAGNOSIS — R079 Chest pain, unspecified: Secondary | ICD-10-CM | POA: Diagnosis not present

## 2023-03-09 DIAGNOSIS — R109 Unspecified abdominal pain: Secondary | ICD-10-CM | POA: Diagnosis not present

## 2023-03-09 DIAGNOSIS — R0789 Other chest pain: Secondary | ICD-10-CM | POA: Diagnosis not present

## 2023-03-09 DIAGNOSIS — Z8673 Personal history of transient ischemic attack (TIA), and cerebral infarction without residual deficits: Secondary | ICD-10-CM | POA: Diagnosis not present

## 2023-03-09 DIAGNOSIS — R1084 Generalized abdominal pain: Secondary | ICD-10-CM | POA: Diagnosis not present

## 2023-03-09 DIAGNOSIS — R110 Nausea: Secondary | ICD-10-CM | POA: Diagnosis not present

## 2023-03-09 DIAGNOSIS — R112 Nausea with vomiting, unspecified: Secondary | ICD-10-CM | POA: Diagnosis not present

## 2023-03-09 LAB — URINALYSIS, W/ REFLEX TO CULTURE (INFECTION SUSPECTED)
Bacteria, UA: NONE SEEN
Bilirubin Urine: NEGATIVE
Glucose, UA: NEGATIVE mg/dL
Ketones, ur: 20 mg/dL — AB
Leukocytes,Ua: NEGATIVE
Nitrite: NEGATIVE
Protein, ur: NEGATIVE mg/dL
Specific Gravity, Urine: 1.013 (ref 1.005–1.030)
pH: 8 (ref 5.0–8.0)

## 2023-03-09 LAB — CBC WITH DIFFERENTIAL/PLATELET
Abs Immature Granulocytes: 0.03 10*3/uL (ref 0.00–0.07)
Basophils Absolute: 0 10*3/uL (ref 0.0–0.1)
Basophils Relative: 0 %
Eosinophils Absolute: 0 10*3/uL (ref 0.0–0.5)
Eosinophils Relative: 0 %
HCT: 41.3 % (ref 36.0–46.0)
Hemoglobin: 14.2 g/dL (ref 12.0–15.0)
Immature Granulocytes: 0 %
Lymphocytes Relative: 15 %
Lymphs Abs: 1.2 10*3/uL (ref 0.7–4.0)
MCH: 33 pg (ref 26.0–34.0)
MCHC: 34.4 g/dL (ref 30.0–36.0)
MCV: 96 fL (ref 80.0–100.0)
Monocytes Absolute: 0.3 10*3/uL (ref 0.1–1.0)
Monocytes Relative: 4 %
Neutro Abs: 6.3 10*3/uL (ref 1.7–7.7)
Neutrophils Relative %: 81 %
Platelets: 170 10*3/uL (ref 150–400)
RBC: 4.3 MIL/uL (ref 3.87–5.11)
RDW: 13.9 % (ref 11.5–15.5)
WBC: 7.8 10*3/uL (ref 4.0–10.5)
nRBC: 0 % (ref 0.0–0.2)

## 2023-03-09 LAB — COMPREHENSIVE METABOLIC PANEL
ALT: 13 U/L (ref 0–44)
AST: 20 U/L (ref 15–41)
Albumin: 4.4 g/dL (ref 3.5–5.0)
Alkaline Phosphatase: 49 U/L (ref 38–126)
Anion gap: 13 (ref 5–15)
BUN: 10 mg/dL (ref 6–20)
CO2: 21 mmol/L — ABNORMAL LOW (ref 22–32)
Calcium: 9.7 mg/dL (ref 8.9–10.3)
Chloride: 107 mmol/L (ref 98–111)
Creatinine, Ser: 1.13 mg/dL — ABNORMAL HIGH (ref 0.44–1.00)
GFR, Estimated: 59 mL/min — ABNORMAL LOW (ref >60)
Glucose, Bld: 151 mg/dL — ABNORMAL HIGH (ref 70–99)
Potassium: 3.5 mmol/L (ref 3.5–5.1)
Sodium: 141 mmol/L (ref 135–145)
Total Bilirubin: 0.7 mg/dL (ref 0.3–1.2)
Total Protein: 7.4 g/dL (ref 6.5–8.1)

## 2023-03-09 LAB — TROPONIN I (HIGH SENSITIVITY)
Troponin I (High Sensitivity): 4 ng/L (ref <18)
Troponin I (High Sensitivity): 7 ng/L (ref <18)

## 2023-03-09 LAB — BRAIN NATRIURETIC PEPTIDE: B Natriuretic Peptide: 37 pg/mL (ref 0.0–100.0)

## 2023-03-09 MED ORDER — HYDROMORPHONE HCL 1 MG/ML IJ SOLN
1.0000 mg | Freq: Once | INTRAMUSCULAR | Status: AC
  Administered 2023-03-09: 1 mg via INTRAVENOUS
  Filled 2023-03-09: qty 1

## 2023-03-09 MED ORDER — PANTOPRAZOLE SODIUM 40 MG PO TBEC: 40.0000 mg | DELAYED_RELEASE_TABLET | Freq: Every day | ORAL | 0 refills | Status: DC

## 2023-03-09 MED ORDER — SODIUM CHLORIDE 0.9 % IV BOLUS
1000.0000 mL | Freq: Once | INTRAVENOUS | Status: AC
  Administered 2023-03-09: 1000 mL via INTRAVENOUS

## 2023-03-09 MED ORDER — AMLODIPINE BESYLATE 5 MG PO TABS
10.0000 mg | ORAL_TABLET | Freq: Once | ORAL | Status: AC
  Administered 2023-03-09: 10 mg via ORAL
  Filled 2023-03-09: qty 2

## 2023-03-09 MED ORDER — ONDANSETRON 4 MG PO TBDP: 4.0000 mg | ORAL_TABLET | Freq: Three times a day (TID) | ORAL | 0 refills | Status: DC | PRN

## 2023-03-09 MED ORDER — DICYCLOMINE HCL 20 MG PO TABS: 20.0000 mg | ORAL_TABLET | Freq: Three times a day (TID) | ORAL | 0 refills | Status: DC | PRN

## 2023-03-09 MED ORDER — IOHEXOL 350 MG/ML SOLN
85.0000 mL | Freq: Once | INTRAVENOUS | Status: AC | PRN
  Administered 2023-03-09: 85 mL via INTRAVENOUS

## 2023-03-09 MED ORDER — MORPHINE SULFATE (PF) 4 MG/ML IV SOLN
4.0000 mg | Freq: Once | INTRAVENOUS | Status: AC
  Administered 2023-03-09: 4 mg via INTRAVENOUS
  Filled 2023-03-09: qty 1

## 2023-03-09 MED ORDER — METOCLOPRAMIDE HCL 5 MG/ML IJ SOLN
10.0000 mg | Freq: Once | INTRAMUSCULAR | Status: AC
  Administered 2023-03-09: 10 mg via INTRAVENOUS
  Filled 2023-03-09: qty 2

## 2023-03-09 NOTE — ED Triage Notes (Signed)
Pt c/o left sided chest pain and lower abdominal, N/V, chills that started at 0500 today. Pt is hypertensive, because she has been out of bp meds for the past two days.

## 2023-03-09 NOTE — Discharge Instructions (Signed)

## 2023-03-09 NOTE — ED Provider Notes (Signed)
[place] EMERGENCY DEPARTMENT AT [facility] Provider Note  CSN: [id number] Arrival date & time: 03/09/23 1359  Chief Complaint(s) Abdominal Pain and Chest Pain  HPI Dana Greer is a 52 y.o. female with history of obesity, cyclic vomiting, prior stroke presenting to the emergency department with chest pain.  She reports it began suddenly today, associated with nausea and vomiting.  She also reports abdominal pain, nausea and vomiting.  She reports chills, no fevers.  No cough.  She has been out of her blood pressure medications.  No headaches.  No diarrhea, melena, hematochezia.  She called EMS who gave her Zofran which she reports did not help with her nausea and vomiting.  She specifically requested Reglan.   Past Medical History Past Medical History:  Diagnosis Date   Arthritis    Depression    Fibromyalgia    Headache(784.0)    Hx of adenomatous polyp of colon 02/14/2020   Hypertension    Immune deficiency disorder (HCC)    Migraine headache    Shingles    Stroke (HCC)    Thrombocytopenia (HCC) 10/01/2012   Patient Active Problem List   Diagnosis Date Noted   Skin pain 01/08/2023   Ovarian cyst 11/05/2022   Elevated troponin 10/05/2022   Thickening of wall of gallbladder 10/05/2022   Anxiety 10/05/2022   Scoliosis 09/07/2022   Microscopic hematuria 07/05/2022   Dysphagia 05/16/2022   Rhabdomyolysis 05/05/2022   Heat exhaustion, unspecified, sequela 05/05/2022   Generalized weakness 05/05/2022   Obesity (BMI 30-39.9)    Rash and nonspecific skin eruption 11/15/2021   Perioral dermatitis 11/02/2021   Mixed incontinence 08/16/2021   Elevated serum creatinine 08/12/2021   Urinary frequency 08/12/2021   Herpes zoster without complication 05/04/2021   Hand pain, left 10/14/2020   Swelling of hand joint, left 10/14/2020   TMJ (temporomandibular joint syndrome) 08/18/2020   Non-intractable cyclical vomiting 05/15/2020   Left-sided back pain 02/25/2020    Hx of adenomatous polyp of colon 02/14/2020   Seasonal allergic rhinitis 01/21/2020   Chronic cough 10/04/2019   Headache, post-traumatic, acute 01/28/2019   Head injury, closed 01/17/2019   Abnormal vaginal bleeding 07/06/2018   Neck pain 06/25/2018   Migraine, chronic, without aura 04/12/2018   B12 deficiency 01/10/2018   Foot pain, bilateral 01/09/2018   Hand paresthesia 01/09/2018   Vitamin D deficiency 01/09/2018   Upper abdominal pain 11/22/2016   AKI (acute kidney injury) (HCC) 11/22/2016   Chronic myofascial pain 10/26/2016   Encounter for screening mammogram for breast cancer 10/25/2016   HSV-2 infection 08/17/2016   Adverse effects of medication 11/25/2015   Vertigo 10/08/2015   Carpal tunnel syndrome 03/16/2015   Chronic nausea 02/17/2015   Gout 12/22/2014   Vertiginous migraine 02/03/2014   Intractable migraine 08/31/2012   Osteoarthritis 05/11/2012   Female bladder prolapse 05/11/2012   ADJ DISORDER WITH MIXED ANXIETY & DEPRESSED MOOD 06/30/2008   HYPERHIDROSIS 01/25/2008   BACK PAIN, LUMBAR, CHRONIC 10/16/2007   REACTION, ACUTE STRESS W/EMOTIONAL DSTURB 08/01/2007   Prediabetes 08/01/2007   Hypertension 08/01/2007   Goiter 07/11/2007   POLYCYSTIC OVARIAN DISEASE 05/24/2007   HIRSUTISM 05/24/2007   Arthropathy, multiple sites 05/24/2007   Home Medication(s) Prior to Admission medications   Medication Sig Start Date End Date Taking? Authorizing Provider  ALPRAZolam Prudy Feeler) 1 MG tablet Take 1 mg by mouth 4 (four) times daily as needed for anxiety or sleep.     [provider]  amitriptyline (ELAVIL) 25 MG tablet Take 2  tablets (50 mg total) by mouth daily. 05/18/20   Darlin Drop, DO  amLODipine (NORVASC) 10 MG tablet Take 1 tablet (10 mg total) by mouth daily. 12/06/22   [name], Audrie Gallus, MD  cloNIDine (CATAPRES - DOSED IN MG/24 HR) 0.2 mg/24hr patch Place 1 patch (0.2 mg total) onto the skin once a week. 07/13/22   [name], Audrie Gallus, MD  cyclobenzaprine  (FLEXERIL) 10 MG tablet Take 1 tablet (10 mg total) by mouth at bedtime as needed for muscle spasms. 10/05/21   [name], Audrie Gallus, MD  EPINEPHrine (EPIPEN 2-PAK) 0.3 mg/0.3 mL IJ SOAJ injection Inject 0.3 mg into the muscle as needed for anaphylaxis. 12/30/21   Marcelyn Bruins, MD  etonogestrel (NEXPLANON) 68 MG IMPL implant Inject 1 each into the skin continuous. Reported on 01/27/2016    [provider]  famotidine (PEPCID) 20 MG tablet TAKE 1 TABLET BY MOUTH 2 TIMES DAILY Patient taking differently: Take 20 mg by mouth 2 (two) times daily. 01/25/22   [name], Audrie Gallus, MD  meclizine (ANTIVERT) 25 MG tablet Take 50 mg by mouth as needed for dizziness.    [provider]  methylPREDNISolone (MEDROL DOSEPAK) 4 MG TBPK tablet Take per packet instructions 02/09/23   Huel Cote, MD  metoCLOPramide (REGLAN) 10 MG tablet TAKE ONE TABLET BY MOUTH EVERY 6 HOURS AS NEEDED FOR NAUSEA OR VOMITING Patient taking differently: Take 10 mg by mouth every 6 (six) hours as needed for vomiting or nausea. 09/30/22   [name], Audrie Gallus, MD  ondansetron (ZOFRAN) 8 MG tablet Take 8 mg by mouth 3 (three) times daily. 06/24/21   [provider]  pantoprazole (PROTONIX) 40 MG tablet Take 1 tablet (40 mg total) by mouth daily. 12/06/22   [name], Audrie Gallus, MD  promethazine (PHENERGAN) 25 MG suppository Place 1 suppository (25 mg total) rectally every 6 (six) hours as needed for nausea or vomiting. 04/26/22   [name], Audrie Gallus, MD  valACYclovir (VALTREX) 500 MG tablet TAKE ONE TABLET (500 mg) BY MOUTH EVERY DAY 02/08/23   [name], Audrie Gallus, MD                                                                                                                                    Past Surgical History Past Surgical History:  Procedure Laterality Date   ABDOMINAL HYSTERECTOMY     CARPAL TUNNEL RELEASE Left 09/25/2018   Procedure: LEFT CARPAL TUNNEL RELEASE;  Surgeon: Cindee Salt, MD;  Location: [facility];   Service: Orthopedics;  Laterality: Left;   CESAREAN SECTION  2008   x1   PARTIAL HYSTERECTOMY  2012   due to fibroid   UPPER GASTROINTESTINAL ENDOSCOPY     WISDOM TOOTH EXTRACTION  1995   Family History Family History  Problem Relation Age of Onset   Healthy Mother    Diabetes Father    COPD Father    Breast  cancer Paternal Aunt    Lung cancer Paternal Aunt    Lung cancer Paternal Uncle    Breast cancer Paternal Grandmother    Colon cancer Paternal Grandmother    Rectal cancer Paternal Grandmother    Esophageal cancer Cousin    Stomach cancer Neg Hx     Social History Social History   Tobacco Use   Smoking status: Never   Smokeless tobacco: Never  Vaping Use   Vaping Use: Never used  Substance Use Topics   Alcohol use: Yes    Comment: occasional   Drug use: Yes    Types: Marijuana    Comment: smokes small amount twice daily   Allergies Trazodone and nefazodone, Amoxicillin, Trazodone, and Carafate [sucralfate]  Review of Systems Review of Systems  All other systems reviewed and are negative.   Physical Exam Vital Signs  I have reviewed the triage vital signs BP (!) 210/104   Pulse 92   Temp 98.4 F (36.9 C) (Oral)   Resp (!) 22   Ht 5\' 1"  (1.549 m)   Wt 85.3 kg   LMP 10/16/2019 (Within Days) Comment: had hysterectomy-still has half of her uterus and ovaries  SpO2 100%   BMI 35.53 kg/m  Physical Exam Vitals and nursing note reviewed.  Constitutional:      General: She is not in acute distress.    Appearance: She is well-developed.  HENT:     Head: Normocephalic and atraumatic.     Mouth/Throat:     Mouth: Mucous membranes are moist.  Eyes:     Pupils: Pupils are equal, round, and reactive to light.  Cardiovascular:     Rate and Rhythm: Normal rate and regular rhythm.     Pulses:          Radial pulses are 2+ on the right side and 2+ on the left side.     Heart sounds: No murmur heard. Pulmonary:     Effort: Pulmonary effort is normal. No  respiratory distress.     Breath sounds: Normal breath sounds.  Abdominal:     General: Abdomen is flat.     Palpations: Abdomen is soft.     Tenderness: There is no abdominal tenderness.  Musculoskeletal:        General: No tenderness.     Right lower leg: No edema.     Left lower leg: No edema.  Skin:    General: Skin is warm and dry.  Neurological:     General: No focal deficit present.     Mental Status: She is alert. Mental status is at baseline.  Psychiatric:        Mood and Affect: Mood normal.        Behavior: Behavior normal.     ED Results and Treatments Labs (all labs ordered are listed, but only abnormal results are displayed) Labs Reviewed  COMPREHENSIVE METABOLIC PANEL - Abnormal; Notable for the following components:      Result Value   CO2 21 (*)    Glucose, Bld 151 (*)    Creatinine, Ser 1.13 (*)    GFR, Estimated 59 (*)    All other components within normal limits  URINALYSIS, W/ REFLEX TO CULTURE (INFECTION SUSPECTED) - Abnormal; Notable for the following components:   Color, Urine STRAW (*)    Hgb urine dipstick MODERATE (*)    Ketones, ur 20 (*)    All other components within normal limits  CBC WITH DIFFERENTIAL/PLATELET  BRAIN NATRIURETIC PEPTIDE  TROPONIN I (HIGH SENSITIVITY)  TROPONIN I (HIGH SENSITIVITY)                                                                                                                          Radiology DG Chest Port 1 View  Result Date: 03/09/2023 CLINICAL DATA:  Chest pain EXAM: PORTABLE CHEST 1 VIEW COMPARISON:  10/05/2022 FINDINGS: The heart size and mediastinal contours are within normal limits. Both lungs are clear. Dextroscoliosis of the spine. IMPRESSION: No active disease. Electronically Signed   By: Jasmine Pang M.D.   On: 03/09/2023 15:34    Pertinent labs & imaging results that were available during my care of the patient were reviewed by me and considered in my medical decision making (see MDM for  details).  Medications Ordered in ED Medications  morphine (PF) 4 MG/ML injection 4 mg (4 mg Intravenous Given 03/09/23 1442)  metoCLOPramide (REGLAN) injection 10 mg (10 mg Intravenous Given 03/09/23 1442)  sodium chloride 0.9 % bolus 1,000 mL (0 mLs Intravenous Stopped 03/09/23 1524)  HYDROmorphone (DILAUDID) injection 1 mg (1 mg Intravenous Given 03/09/23 1644)                                                                                                                                     Procedures Procedures  (including critical care time)  Medical Decision Making / ED Course   MDM:  52 year old female presenting to the emergency department chest pain and abdominal pain.  Patient overall well-appearing, mildly uncomfortable appearance.  Endorses chest and abdominal pain.  2+ radial pulses bilaterally.  No abdominal tenderness.  Differential includes cyclic vomiting, acute aortic syndrome such as dissection, less likely ACS with atypical history, reassuring EKG but will check troponin.  Doubt pulmonary embolism, no shortness of breath.  Could also represent intra-abdominal process such as obstruction, volvulus, Stratus.  Given significant hypertension will obtain CT dissection protocol.  Chest x-ray shows scoliosis which has been seen on previous x-rays no other acute process.  Signed out to oncoming provider pending reassessment and CT dissection study.      Additional history obtained: -Additional history obtained from ems    Lab Tests: -I ordered, reviewed, and interpreted labs.   The pertinent results include:   Labs Reviewed  COMPREHENSIVE METABOLIC PANEL - Abnormal; Notable for the following components:      Result Value   CO2 21 (*)  Glucose, Bld 151 (*)    Creatinine, Ser 1.13 (*)    GFR, Estimated 59 (*)    All other components within normal limits  URINALYSIS, W/ REFLEX TO CULTURE (INFECTION SUSPECTED) - Abnormal; Notable for the following components:   Color,  Urine STRAW (*)    Hgb urine dipstick MODERATE (*)    Ketones, ur 20 (*)    All other components within normal limits  CBC WITH DIFFERENTIAL/PLATELET  BRAIN NATRIURETIC PEPTIDE  TROPONIN I (HIGH SENSITIVITY)  TROPONIN I (HIGH SENSITIVITY)    Notable for stable CKD  EKG   EKG Interpretation  Date/Time:  Thursday Mar 09 2023 14:07:35 EDT Ventricular Rate:  60 PR Interval:  175 QRS Duration: 83 QT Interval:  450 QTC Calculation: 450 R Axis:   57 Text Interpretation: Sinus rhythm Confirmed by Alvino Blood ([id number]) on 03/09/2023 2:30:57 PM         Imaging Studies ordered: I ordered imaging studies including CXR On my interpretation imaging demonstrates no acute process I independently visualized and interpreted imaging. I agree with the radiologist interpretation   Medicines ordered and prescription drug management: Meds ordered this encounter  Medications   morphine (PF) 4 MG/ML injection 4 mg   metoCLOPramide (REGLAN) injection 10 mg   sodium chloride 0.9 % bolus 1,000 mL   HYDROmorphone (DILAUDID) injection 1 mg    -I have reviewed the patients home medicines and have made adjustments as needed    Cardiac Monitoring: The patient was maintained on a cardiac monitor.  I personally viewed and interpreted the cardiac monitored which showed an underlying rhythm of: NSR  Social Determinants of Health:  Diagnosis or treatment significantly limited by social determinants of health: obesity   Reevaluation: After the interventions noted above, I reevaluated the patient and found that their symptoms have improved  Co morbidities that complicate the patient evaluation  Past Medical History:  Diagnosis Date   Arthritis    Depression    Fibromyalgia    Headache(784.0)    Hx of adenomatous polyp of colon 02/14/2020   Hypertension    Immune deficiency disorder (HCC)    Migraine headache    Shingles    Stroke (HCC)    Thrombocytopenia (HCC) 10/01/2012       Dispostion: Disposition decision including need for hospitalization was considered, and patient disposition pending at time of sign out.    Final Clinical Impression(s) / ED Diagnoses Final diagnoses:  Chest pain, unspecified type     This chart was dictated using voice recognition software.  Despite best efforts to proofread,  errors can occur which can change the documentation meaning.    Lonell Grandchild, MD 03/09/23 [phone]

## 2023-03-09 NOTE — ED Provider Notes (Signed)
Blood pressure (!) 210/104, pulse 92, temperature 98.4 F (36.9 C), temperature source Oral, resp. rate (!) 22, height 5\' 1"  (1.549 m), weight 85.3 kg, last menstrual period 10/16/2019, SpO2 100 %.  Assuming care from Dr. Suezanne Jacquet.  In short, Dana Greer is a 52 y.o. female with a chief complaint of Abdominal Pain and Chest Pain .  Refer to the original H&P for additional details.  The current plan of care is to follow up on CTA dissection and reassess.  06:15 PM  Patient reassessed. CTA dissection without acute abnormality. She will take her home BP medications at home. No evidence of HTN emergency. Feeling much better and stable for discharge.     Maia Plan, MD 03/09/23 [phone]

## 2023-03-10 ENCOUNTER — Ambulatory Visit (INDEPENDENT_AMBULATORY_CARE_PROVIDER_SITE_OTHER): Payer: Medicare Other | Admitting: Orthopaedic Surgery

## 2023-03-10 DIAGNOSIS — M545 Low back pain, unspecified: Secondary | ICD-10-CM | POA: Diagnosis not present

## 2023-03-10 DIAGNOSIS — G8929 Other chronic pain: Secondary | ICD-10-CM | POA: Diagnosis not present

## 2023-03-10 NOTE — Progress Notes (Signed)
Chief Complaint: Radiating lumbar back pain     History of Present Illness:   03/10/2023: Presents today for follow-up of her known scoliosis of the back she is here today for MRI discussion.  Dana Greer is a 52 y.o. female with lower back pain which has been chronic for the last several years although this is now radiating down bilateral lower extremities.  She states that this is worse with any type of activity and she is now in excruciating pain really with any type of attempt to be active.  She has previously taken NSAIDs as well as Bengay and Tiger balm.  She has done physical therapy for her back.  She does have a known history of scoliosis.  She has some pain with increased flexion and forward sitting    Surgical History:   None  PMH/PSH/Family History/Social History/Meds/Allergies:    Past Medical History:  Diagnosis Date   Arthritis    Depression    Fibromyalgia    Headache(784.0)    Hx of adenomatous polyp of colon 02/14/2020   Hypertension    Immune deficiency disorder (HCC)    Migraine headache    Shingles    Stroke (HCC)    Thrombocytopenia (HCC) 10/01/2012   Past Surgical History:  Procedure Laterality Date   ABDOMINAL HYSTERECTOMY     CARPAL TUNNEL RELEASE Left 09/25/2018   Procedure: LEFT CARPAL TUNNEL RELEASE;  Surgeon: Cindee Salt, MD;  Location:  [facility];  Service: Orthopedics;  Laterality: Left;   CESAREAN SECTION  2008   x1   PARTIAL HYSTERECTOMY  2012   due to fibroid   UPPER GASTROINTESTINAL ENDOSCOPY     WISDOM TOOTH EXTRACTION  1995   Social History   Socioeconomic History   Marital status: Legally Separated    Spouse name: Not on file   Number of children: 3   Years of education: college   Highest education level: Not on file  Occupational History   Occupation: Unemployed  Tobacco Use   Smoking status: Never   Smokeless tobacco: Never  Vaping Use   Vaping Use: Never used   Substance and Sexual Activity   Alcohol use: Yes    Comment: occasional   Drug use: Yes    Types: Marijuana    Comment: smokes small amount twice daily   Sexual activity: Not Currently    Birth control/protection: None  Other Topics Concern   Not on file  Social History Narrative   Lives at home with her children.   6 cups caffeine per day.   Right-handed.   Social Determinants of Health   Financial Resource Strain: High Risk (08/15/2022)   Overall Financial Resource Strain (CARDIA)    Difficulty of Paying Living Expenses: Hard  Food Insecurity: No Food Insecurity (10/06/2022)   Hunger Vital Sign    Worried About Running Out of Food in the Last Year: Never true    Ran Out of Food in the Last Year: Never true  Recent Concern: Food Insecurity - Food Insecurity Present (08/17/2022)   Hunger Vital Sign    Worried About Running Out of Food in the Last Year: Sometimes true    Ran Out of Food in the Last Year: Sometimes true  Transportation Needs: No Transportation Needs (10/06/2022)   PRAPARE - Transportation  Lack of Transportation (Medical): No    Lack of Transportation (Non-Medical): No  Physical Activity: Inactive (08/15/2022)   Exercise Vital Sign    Days of Exercise per Week: 0 days    Minutes of Exercise per Session: 0 min  Stress: No Stress Concern Present (08/15/2022)   [employer] of Occupational Health - Occupational Stress Questionnaire    Feeling of Stress : Only a little  Social Connections: Moderately Isolated (08/15/2022)   Social Connection and Isolation Panel [NHANES]    Frequency of Communication with Friends and Family: More than three times a week    Frequency of Social Gatherings with Friends and Family: Three times a week    Attends Religious Services: More than 4 times per year    Active Member of Clubs or Organizations: No    Attends Banker Meetings: Never    Marital Status: Separated   Family History  Problem Relation Age of  Onset   Healthy Mother    Diabetes Father    COPD Father    Breast cancer Paternal Aunt    Lung cancer Paternal Aunt    Lung cancer Paternal Uncle    Breast cancer Paternal Grandmother    Colon cancer Paternal Grandmother    Rectal cancer Paternal Grandmother    Esophageal cancer Cousin    Stomach cancer Neg Hx    Allergies  Allergen Reactions   Trazodone And Nefazodone Nausea And Vomiting   Amoxicillin Other (See Comments)    Yeast infection    Trazodone Nausea And Vomiting   Carafate [Sucralfate] Nausea And Vomiting   Current Outpatient Medications  Medication Sig Dispense Refill   ALPRAZolam (XANAX) 1 MG tablet Take 1 mg by mouth 4 (four) times daily as needed for anxiety or sleep.      amitriptyline (ELAVIL) 25 MG tablet Take 2 tablets (50 mg total) by mouth daily. 60 tablet 0   amLODipine (NORVASC) 10 MG tablet Take 1 tablet (10 mg total) by mouth daily. 90 tablet 3   cloNIDine (CATAPRES - DOSED IN MG/24 HR) 0.2 mg/24hr patch Place 1 patch (0.2 mg total) onto the skin once a week. 4 patch 5   cyclobenzaprine (FLEXERIL) 10 MG tablet Take 1 tablet (10 mg total) by mouth at bedtime as needed for muscle spasms. 30 tablet 3   dicyclomine (BENTYL) 20 MG tablet Take 1 tablet (20 mg total) by mouth 3 (three) times daily as needed for spasms. 20 tablet 0   EPINEPHrine (EPIPEN 2-PAK) 0.3 mg/0.3 mL IJ SOAJ injection Inject 0.3 mg into the muscle as needed for anaphylaxis. 2 each 1   etonogestrel (NEXPLANON) 68 MG IMPL implant Inject 1 each into the skin continuous. Reported on 01/27/2016     famotidine (PEPCID) 20 MG tablet TAKE 1 TABLET BY MOUTH 2 TIMES DAILY (Patient taking differently: Take 20 mg by mouth 2 (two) times daily.) 60 tablet 3   meclizine (ANTIVERT) 25 MG tablet Take 50 mg by mouth as needed for dizziness.     methylPREDNISolone (MEDROL DOSEPAK) 4 MG TBPK tablet Take per packet instructions 21 each 0   metoCLOPramide (REGLAN) 10 MG tablet TAKE ONE TABLET BY MOUTH EVERY 6  HOURS AS NEEDED FOR NAUSEA OR VOMITING (Patient taking differently: Take 10 mg by mouth every 6 (six) hours as needed for vomiting or nausea.) 60 tablet 0   ondansetron (ZOFRAN) 8 MG tablet Take 8 mg by mouth 3 (three) times daily.     ondansetron (ZOFRAN-ODT) 4 MG disintegrating  tablet Take 1 tablet (4 mg total) by mouth every 8 (eight) hours as needed for nausea or vomiting. 20 tablet 0   pantoprazole (PROTONIX) 40 MG tablet Take 1 tablet (40 mg total) by mouth daily. 30 tablet 0   promethazine (PHENERGAN) 25 MG suppository Place 1 suppository (25 mg total) rectally every 6 (six) hours as needed for nausea or vomiting. 20 each 0   valACYclovir (VALTREX) 500 MG tablet TAKE ONE TABLET (500 mg) BY MOUTH EVERY DAY 30 tablet 0   No current facility-administered medications for this visit.   CT Angio Chest/Abd/Pel for Dissection W and/or W/WO  Result Date: 03/09/2023 CLINICAL DATA:  Acute aortic syndrome suspected, left-sided chest and lower abdominal pain, nausea, vomiting and chills EXAM: CT ANGIOGRAPHY CHEST, ABDOMEN AND PELVIS TECHNIQUE: Non-contrast CT of the chest was initially obtained. Multidetector CT imaging through the chest, abdomen and pelvis was performed using the standard protocol during bolus administration of intravenous contrast. Multiplanar reconstructed images and MIPs were obtained and reviewed to evaluate the vascular anatomy. RADIATION DOSE REDUCTION: This exam was performed according to the departmental dose-optimization program which includes automated exposure control, adjustment of the mA and/or kV according to patient size and/or use of iterative reconstruction technique. CONTRAST:  85mL OMNIPAQUE IOHEXOL 350 MG/ML SOLN COMPARISON:  CT chest angiogram, 10/05/2022 FINDINGS: CTA CHEST FINDINGS VASCULAR Aorta: Satisfactory opacification of the aorta. Normal contour and caliber of the thoracic aorta. No evidence of aneurysm, dissection, or other acute aortic pathology. Cardiovascular:  No evidence of pulmonary embolism on limited non-tailored examination. Normal heart size. No pericardial effusion. Review of the MIP images confirms the above findings. NON VASCULAR Mediastinum/Nodes: No enlarged mediastinal, hilar, or axillary lymph nodes. Thymic remnant in the anterior mediastinum. Thyroid gland, trachea, and esophagus demonstrate no significant findings. Lungs/Pleura: Lungs are clear. No pleural effusion or pneumothorax. Musculoskeletal: No chest wall abnormality. No acute osseous findings. Dextroscoliosis of the thoracic spine Review of the MIP images confirms the above findings. CTA ABDOMEN AND PELVIS FINDINGS VASCULAR Normal contour and caliber of the abdominal aorta. No evidence of aneurysm, dissection, or other acute aortic pathology. Standard branching pattern of the abdominal aorta with solitary bilateral renal arteries. Review of the MIP images confirms the above findings. NON-VASCULAR Hepatobiliary: No solid liver abnormality is seen. No gallstones, gallbladder wall thickening, or biliary dilatation. Pancreas: Unremarkable. No pancreatic ductal dilatation or surrounding inflammatory changes. Spleen: Normal in size without significant abnormality. Adrenals/Urinary Tract: Adrenal glands are unremarkable. Kidneys are normal, without renal calculi, solid lesion, or hydronephrosis. Bladder is unremarkable. Stomach/Bowel: Stomach is within normal limits. Appendix appears normal. No evidence of bowel wall thickening, distention, or inflammatory changes. Occasional sigmoid diverticula. Lymphatic: No enlarged abdominal or pelvic lymph nodes. Reproductive: No mass or other significant abnormality. Other: No abdominal wall hernia or abnormality. No ascites. Musculoskeletal: No acute osseous findings. IMPRESSION: 1. Normal contour and caliber of the thoracic and abdominal aorta. No evidence of aneurysm, dissection, or other acute aortic pathology. 2. No acute CT findings of the chest, abdomen, or  pelvis to explain pain. Electronically Signed   By: Jearld Lesch M.D.   On: 03/09/2023 17:45   DG Chest Port 1 View  Result Date: 03/09/2023 CLINICAL DATA:  Chest pain EXAM: PORTABLE CHEST 1 VIEW COMPARISON:  10/05/2022 FINDINGS: The heart size and mediastinal contours are within normal limits. Both lungs are clear. Dextroscoliosis of the spine. IMPRESSION: No active disease. Electronically Signed   By: Jasmine Pang M.D.   On: 03/09/2023 15:34  Review of Systems:   A ROS was performed including pertinent positives and negatives as documented in the HPI.  Physical Exam :   Constitutional: NAD and appears stated age Neurological: Alert and oriented Psych: Appropriate affect and cooperative Last menstrual period 10/16/2019.   Comprehensive Musculoskeletal Exam:    She has a positive radicular pain with forward flexion of the spine in the sitting position.  Positive straight leg raise bilaterally.  She does have some scoliotic curvature on standing.  She has no palpable or discernible weakness in any motor groups of the bilateral lower extremities  Imaging:   Xray (4 views lumbar spine): There is a thoracolumbar scoliotic curve with evidence of degenerative disc disease at multiple levels   I personally reviewed and interpreted the radiographs.   Assessment:   52 y.o. female with likely lumbar radiculitis in the setting of significant scoliotic curvature.  I did discuss today that her MRI does not show any evidence of any type of radicular impingement.  The majority of her symptoms do appear to be emanating from her large scoliotic curvature.  At this time she is somewhat symptomatic from this and this is dramatically diminishing her quality of life.  I do believe that given this that she would benefit from referral to my partner Dr. Christell Constant for at a minimum a discussion of a possible scoliosis correction. Plan :    -Plan for referral to Dr. Christell Constant for discussion of scoliosis  correction     I personally saw and evaluated the patient, and participated in the management and treatment plan.  Huel Cote, MD Attending Physician, Orthopedic Surgery  This document was dictated using Dragon voice recognition software. A reasonable attempt at proof reading has been made to minimize errors.

## 2023-03-17 ENCOUNTER — Telehealth: Payer: Self-pay

## 2023-03-17 NOTE — Telephone Encounter (Signed)
Transition Care Management Follow-up Telephone Call Date of discharge and from where: 03/09/2023 [facility] How have you been since you were released from the hospital? Patient stated she is feeling better, but has not received her medication yet.  Any questions or concerns? No  Items Reviewed: Did the pt receive and understand the discharge instructions provided? Yes  Medications obtained and verified?  Patient stated the pharmacy has not filled her medication yet and she has made her PCP aware. Other? No  Any new allergies since your discharge? No  Dietary orders reviewed? Yes Do you have support at home? Yes   Follow up appointments reviewed:  PCP Hospital f/u appt confirmed? No  Scheduled to see  on  @ . Specialist Hospital f/u appt confirmed? No  Scheduled to see  on  @ . Are transportation arrangements needed? No  If their condition worsens, is the pt aware to call PCP or go to the Emergency Dept.? Yes Was the patient provided with contact information for the PCP's office or ED? Yes Was to pt encouraged to call back with questions or concerns? Yes  Kaliah Haddaway [facility]  [facility] Population Health Community Resource Care Guide   [email or web address]  ?? [phone]   Website: [email or web address]  [email or web address]

## 2023-03-20 ENCOUNTER — Telehealth: Payer: Self-pay | Admitting: Pharmacist

## 2023-03-20 DIAGNOSIS — I10 Essential (primary) hypertension: Secondary | ICD-10-CM

## 2023-03-20 NOTE — Telephone Encounter (Signed)
PharmD reviewed patient chart to assess eligibility for [facility] Coordination services. Patient was determined to be a good candidate for the program given the complexity of the medication regimen and/or overall risk for hospitalization and increased utilization.  Referral entered in order to outreach patient and offer appointment with PharmD. Referral cosigned to PCP.  

## 2023-03-24 ENCOUNTER — Ambulatory Visit (INDEPENDENT_AMBULATORY_CARE_PROVIDER_SITE_OTHER): Payer: Medicare Other | Admitting: Orthopedic Surgery

## 2023-03-24 ENCOUNTER — Encounter: Payer: Self-pay | Admitting: Family Medicine

## 2023-03-24 ENCOUNTER — Other Ambulatory Visit: Payer: Self-pay

## 2023-03-24 ENCOUNTER — Emergency Department (HOSPITAL_BASED_OUTPATIENT_CLINIC_OR_DEPARTMENT_OTHER)
Admission: EM | Admit: 2023-03-24 | Discharge: 2023-03-24 | Disposition: A | Discharge status: Home / Self Care | Payer: Medicare Other | Attending: Emergency Medicine | Admitting: Emergency Medicine

## 2023-03-24 ENCOUNTER — Other Ambulatory Visit (INDEPENDENT_AMBULATORY_CARE_PROVIDER_SITE_OTHER): Payer: Medicare Other

## 2023-03-24 ENCOUNTER — Encounter (HOSPITAL_BASED_OUTPATIENT_CLINIC_OR_DEPARTMENT_OTHER): Payer: Self-pay | Admitting: Emergency Medicine

## 2023-03-24 ENCOUNTER — Telehealth: Payer: Self-pay | Admitting: Family Medicine

## 2023-03-24 VITALS — BP 161/104 | HR 62 | Ht 61.0 in | Wt 188.0 lb

## 2023-03-24 DIAGNOSIS — M545 Low back pain, unspecified: Secondary | ICD-10-CM

## 2023-03-24 DIAGNOSIS — G8929 Other chronic pain: Secondary | ICD-10-CM

## 2023-03-24 DIAGNOSIS — Z79899 Other long term (current) drug therapy: Secondary | ICD-10-CM | POA: Diagnosis not present

## 2023-03-24 DIAGNOSIS — R519 Headache, unspecified: Secondary | ICD-10-CM

## 2023-03-24 DIAGNOSIS — I159 Secondary hypertension, unspecified: Secondary | ICD-10-CM | POA: Insufficient documentation

## 2023-03-24 MED ORDER — DIPHENHYDRAMINE HCL 50 MG/ML IJ SOLN
12.5000 mg | Freq: Once | INTRAMUSCULAR | Status: AC
  Administered 2023-03-24: 12.5 mg via INTRAVENOUS
  Filled 2023-03-24: qty 1

## 2023-03-24 MED ORDER — BUTALBITAL-APAP-CAFFEINE 50-325-40 MG PO TABS: 1.0000 | ORAL_TABLET | Freq: Four times a day (QID) | ORAL | 0 refills | Status: DC | PRN

## 2023-03-24 MED ORDER — SODIUM CHLORIDE 0.9 % IV BOLUS
250.0000 mL | Freq: Once | INTRAVENOUS | Status: AC
  Administered 2023-03-24: 250 mL via INTRAVENOUS

## 2023-03-24 MED ORDER — HYDRALAZINE HCL 20 MG/ML IJ SOLN: 10.0000 mg | Freq: Once | INTRAMUSCULAR | Status: DC

## 2023-03-24 MED ORDER — PROCHLORPERAZINE EDISYLATE 10 MG/2ML IJ SOLN
10.0000 mg | Freq: Once | INTRAMUSCULAR | Status: AC
  Administered 2023-03-24: 10 mg via INTRAVENOUS
  Filled 2023-03-24: qty 2

## 2023-03-24 MED ORDER — AMLODIPINE BESYLATE 5 MG PO TABS
5.0000 mg | ORAL_TABLET | Freq: Once | ORAL | Status: AC
  Administered 2023-03-24: 5 mg via ORAL
  Filled 2023-03-24: qty 1

## 2023-03-24 MED ORDER — HYDRALAZINE HCL 20 MG/ML IJ SOLN
10.0000 mg | Freq: Once | INTRAMUSCULAR | Status: AC
  Administered 2023-03-24: 10 mg via INTRAVENOUS
  Filled 2023-03-24: qty 1

## 2023-03-24 NOTE — Telephone Encounter (Signed)
Pt was also sent to triage

## 2023-03-24 NOTE — Progress Notes (Signed)
Orthopedic Spine Surgery Office Note  Assessment: Patient is a 52 y.o. female with thoracic and lumbar scoliotic curves. Also has positive sagittal balance    Plan: -Explained that she has tried conservative treatment but there is not much of a role for PT or injections in pain associated with deformity. Surgery and pain management would be options for her. Explained that deformity surgery comes with a major complication rate of 30% and requires optimization prior to that surgery. Informed her that this is a long surgery and given her thoracic and lumbar curvature, I felt that she would be better served at an institution where they do these larger surgeries and may have assistance to expedite the case. Accordingly, I placed a referral to [facility] for consideration of a deformity correction surgery -Patient should return to office on an as needed basis   Patient expressed understanding of the plan and all questions were answered to the patient's satisfaction.   ___________________________________________________________________________   History:  Patient is a 52 y.o. female who presents today for lumbar spine. Patient has had several years of progressively worse back pain. She feels it throughout her spine. She points to her thoracic and lumbar spine. It has gotten more and more severe with time and she is getting to the point that she cannot even do simple things like shop at the store without significant pain. She has given up on working out because of pain. She does get pain radiating into her bilateral lower extremities. She feels it along the posterior aspect of the thighs. It does not radiate past the knees. Her back pain is more severe than her leg pain. She was never diagnosed with a scoliosis as a kid but did have a relative who had scoliosis.    Weakness: denies Symptoms of imbalance: denies  Paresthesias and numbness: yes, has chronic numbness/paresthesias in her feet Bowel or bladder  incontinence: yes, has had urinary incontinence for 7 years Saddle anesthesia: reports decreased sensation in her groin for over a year  Treatments tried: PT, tylenol, NSAIDs, steroid pills  Review of systems: Denies fevers and chills, night sweats, unexplained weight loss, history of cancer. Has had pain that wakes her at night  Past medical history: Depression/anxiety HTN Fibromyalgia Migraines Neuropathy GERD IBS  Vertigo  Chronic pain  Allergies: trazadone, amoxicillin, carafate  Past surgical history:  L carpal tunnel release C section  Social history: Denies use of nicotine product (smoking, vaping, patches, smokeless) Alcohol use: rare Reports marijuana use, denies other recreational drug use   Physical Exam:  General: no acute distress, appears stated age Neurologic: alert, answering questions appropriately, following commands Respiratory: unlabored breathing on room air, symmetric chest rise Psychiatric: appropriate affect, normal cadence to speech   MSK (spine):  -Strength exam      Left  Right EHL    5/5  5/5 TA    5/5  5/5 GSC    5/5  5/5 Knee extension  5/5  5/5 Hip flexion   5/5  5/5  -Sensory exam    Sensation intact to light touch in L3-S1 nerve distributions of bilateral lower extremities  -Achilles DTR: 2/4 on the left, 2/4 on the right -Patellar tendon DTR: 2/4 on the left, 2/4 on the right  -Straight leg raise: negative bilaterally -Femoral nerve stretch test: negative bilaterally  -Clonus: no beats bilaterally -Negative Romberg -Normal gait, no instability with tandem gait   -Left hip exam: no pain through range of motion, negative stinchfield, negative faber -Right hip  exam: no pain through range of motion, negative stinchfield, negative faber  Imaging: XR of the scoli from 02/22/2023 was independently reviewed and interpreted, showing an apex to the right thoracic curvature that measures 55 degrees. There is a compensatory  lumbar curve that measures 32 degrees. There is lateral listhesis at L3/4. SVA of 7.5cm. ~2.2cm from the CVSL  MRI of the lumbar spine from 02/24/2023 was independently reviewed and interpreted, showing L3/4 right foraminal stenosis. Left foraminal stenosis at L5/S1. No significant central or lateral recess stenosis.    Patient name: Dana Greer Patient MRN: [id number] Date of visit: 03/24/23

## 2023-03-24 NOTE — ED Triage Notes (Signed)
High BP x a few weeks. C/o Headache today. Took meds as prescribed.

## 2023-03-24 NOTE — Discharge Instructions (Addendum)
I would recommend at least taking 5 mg of amlodipine daily.  Follow-up with your primary care doctor.

## 2023-03-24 NOTE — Telephone Encounter (Signed)
Per access nurse note pt agrees to go to ED for eval. Sending note to Dr Milinda Antis and [employer]

## 2023-03-24 NOTE — Telephone Encounter (Signed)
FYI: This call has been transferred to Access Nurse. Once the result note has been entered staff can address the message at that time.  Patient called in with the following symptoms:  Red Word:elevated blood pressure, headache/ lightheadedness    Please advise at Mobile [phone] (mobile)  Message is routed to Provider Pool and [facility] Triage

## 2023-03-24 NOTE — ED Provider Notes (Signed)
[place] EMERGENCY DEPARTMENT AT [facility] Provider Note   CSN: [id number] Arrival date & time: 03/24/23  1704     History  Chief Complaint  Patient presents with   Headache   Hypertension    Dana Greer is a 52 y.o. female.  Patient here with headache and high blood pressure.  States that when her blood pressure gets high she gets headaches.  History of migraines.  Takes clonidine patch for her blood pressure.  Is also prescribed amlodipine but she has not been taking it.  She denies any chest pain or shortness of breath.  No weakness numbness.  No stroke symptoms.  Very common for her to get bad headaches when her blood pressure gets a little bit high.  Denies any vision loss.  No speech changes.  Denies any nausea vomiting.  Does have some light sensitivity.  The history is provided by the patient.       Home Medications Prior to Admission medications   Medication Sig Start Date End Date Taking? Authorizing Provider  butalbital-acetaminophen-caffeine (FIORICET) 50-325-40 MG tablet Take 1-2 tablets by mouth every 6 (six) hours as needed for headache. 03/24/23 03/23/24 Yes Nyeem Stoke, DO  ALPRAZolam Prudy Feeler) 1 MG tablet Take 1 mg by mouth 4 (four) times daily as needed for anxiety or sleep.     [provider]  amitriptyline (ELAVIL) 25 MG tablet Take 2 tablets (50 mg total) by mouth daily. 05/18/20   Darlin Drop, DO  amLODipine (NORVASC) 10 MG tablet Take 1 tablet (10 mg total) by mouth daily. 12/06/22   [name], Audrie Gallus, MD  cloNIDine (CATAPRES - DOSED IN MG/24 HR) 0.2 mg/24hr patch Place 1 patch (0.2 mg total) onto the skin once a week. 07/13/22   [name], Audrie Gallus, MD  cyclobenzaprine (FLEXERIL) 10 MG tablet Take 1 tablet (10 mg total) by mouth at bedtime as needed for muscle spasms. 10/05/21   [name], Audrie Gallus, MD  dicyclomine (BENTYL) 20 MG tablet Take 1 tablet (20 mg total) by mouth 3 (three) times daily as needed for spasms. 03/09/23   Long, Arlyss Repress,  MD  EPINEPHrine (EPIPEN 2-PAK) 0.3 mg/0.3 mL IJ SOAJ injection Inject 0.3 mg into the muscle as needed for anaphylaxis. 12/30/21   Marcelyn Bruins, MD  etonogestrel (NEXPLANON) 68 MG IMPL implant Inject 1 each into the skin continuous. Reported on 01/27/2016    [provider]  famotidine (PEPCID) 20 MG tablet TAKE 1 TABLET BY MOUTH 2 TIMES DAILY Patient taking differently: Take 20 mg by mouth 2 (two) times daily. 01/25/22   [name], Audrie Gallus, MD  meclizine (ANTIVERT) 25 MG tablet Take 50 mg by mouth as needed for dizziness.    [provider]  methylPREDNISolone (MEDROL DOSEPAK) 4 MG TBPK tablet Take per packet instructions 02/09/23   Huel Cote, MD  metoCLOPramide (REGLAN) 10 MG tablet TAKE ONE TABLET BY MOUTH EVERY 6 HOURS AS NEEDED FOR NAUSEA OR VOMITING Patient taking differently: Take 10 mg by mouth every 6 (six) hours as needed for vomiting or nausea. 09/30/22   [name], Audrie Gallus, MD  ondansetron (ZOFRAN) 8 MG tablet Take 8 mg by mouth 3 (three) times daily. 06/24/21   [provider]  ondansetron (ZOFRAN-ODT) 4 MG disintegrating tablet Take 1 tablet (4 mg total) by mouth every 8 (eight) hours as needed for nausea or vomiting. 03/09/23   Long, Arlyss Repress, MD  pantoprazole (PROTONIX) 40 MG tablet Take 1 tablet (40 mg total) by mouth  daily. 03/09/23   Long, Arlyss Repress, MD  promethazine (PHENERGAN) 25 MG suppository Place 1 suppository (25 mg total) rectally every 6 (six) hours as needed for nausea or vomiting. 04/26/22   [name], Audrie Gallus, MD  valACYclovir (VALTREX) 500 MG tablet TAKE ONE TABLET (500 mg) BY MOUTH EVERY DAY 02/08/23   [name], Audrie Gallus, MD      Allergies    Trazodone and nefazodone, Amoxicillin, Trazodone, and Carafate [sucralfate]    Review of Systems   Review of Systems  Physical Exam Updated Vital Signs BP (!) 160/103 (BP Location: Left Arm)   Pulse 60   Temp 98.6 F (37 C) (Oral)   Resp 16   LMP 10/16/2019 (Within Days) Comment: had  hysterectomy-still has half of her uterus and ovaries  SpO2 100%  Physical Exam Vitals and nursing note reviewed.  Constitutional:      General: She is not in acute distress.    Appearance: She is well-developed.  HENT:     Head: Normocephalic and atraumatic.  Eyes:     Conjunctiva/sclera: Conjunctivae normal.  Cardiovascular:     Rate and Rhythm: Normal rate and regular rhythm.     Heart sounds: No murmur heard. Pulmonary:     Effort: Pulmonary effort is normal. No respiratory distress.     Breath sounds: Normal breath sounds.  Abdominal:     Palpations: Abdomen is soft.     Tenderness: There is no abdominal tenderness.  Musculoskeletal:        General: No swelling.     Cervical back: Neck supple.  Skin:    General: Skin is warm and dry.     Capillary Refill: Capillary refill takes less than 2 seconds.  Neurological:     Mental Status: She is alert and oriented to person, place, and time.     Comments: 5+ out of 5 strength throughout, normal sensation, no drift, normal finger-to-nose for speech no visual field deficit  Psychiatric:        Mood and Affect: Mood normal.     ED Results / Procedures / Treatments   Labs (all labs ordered are listed, but only abnormal results are displayed) Labs Reviewed - No data to display  EKG EKG Interpretation  Date/Time:  Friday Mar 24 2023 17:27:40 EDT Ventricular Rate:  62 PR Interval:  174 QRS Duration: 93 QT Interval:  412 QTC Calculation: 419 R Axis:   46 Text Interpretation: Sinus rhythm Low voltage, precordial leads Confirmed by Lockie Mola, Dontavis Tschantz ([id number]) on 03/24/2023 5:30:41 PM  Radiology XR SCOLIOSIS EVAL COMPLETE SPINE 2 OR 3 VIEWS  Result Date: 03/24/2023 XR of the scoli from 02/22/2023 was independently reviewed and interpreted, showing an apex to the right thoracic curvature that measures 55 degrees. There is a compensatory lumbar curve that measures 32 degrees. There is lateral listhesis at L3/4. SVA of 7.5cm. ~2.2cm from  the CVSL    Procedures Procedures    Medications Ordered in ED Medications  prochlorperazine (COMPAZINE) injection 10 mg (10 mg Intravenous Given 03/24/23 1753)  diphenhydrAMINE (BENADRYL) injection 12.5 mg (12.5 mg Intravenous Given 03/24/23 1753)  amLODipine (NORVASC) tablet 5 mg (5 mg Oral Given 03/24/23 1745)  sodium chloride 0.9 % bolus 250 mL ( Intravenous Stopped 03/24/23 1839)  hydrALAZINE (APRESOLINE) injection 10 mg (10 mg Intravenous Given 03/24/23 1834)    ED Course/ Medical Decision Making/ A&P  Medical Decision Making Risk Prescription drug management.   Dana Greer is here with headache and hypertension.  Patient arrives with blood pressure 160/100.  Normal vitals otherwise.  Very well-appearing.  Normal neurological exam.  History of migraines, high blood pressure.  Takes clonidine patch for her blood pressure but has not been taking her amlodipine.  She denies any chest pain or shortness of breath.  No weakness or numbness.  Overall she is well-appearing.  She does not have any vision changes.  Differential diagnosis likely bad migraine associated with headache, photophobia, high blood pressure.  Fairly mild headache that she has had for several weeks now.  Slightly worse today.  I do not think there is a head bleeding or acute neurologic process at this time.  Similar symptoms in the past.  Headache cocktail given with Compazine, Benadryl.  Will give dose of amlodipine 5 mg.  She states that she does not want to take the 10 mg dose because she does not have any leg swelling.  Overall she will try 5 mg dose at home as this could be less likely to cause any leg swelling.  Will give a dose IV hydralazine.  Overall she has had head imaging in the past.  She had a CT scan done last year that was unremarkable.  She does not have any symptoms to suggest pseudotumor cerebri.  Overall I suspect bad migraine.  Overall patient is feeling much better.   She will start taking amlodipine 5 mg.  Will prescribe Fioricet for breakthrough headaches.  Discharged in good condition.  This chart was dictated using voice recognition software.  Despite best efforts to proofread,  errors can occur which can change the documentation meaning.         Final Clinical Impression(s) / ED Diagnoses Final diagnoses:  Nonintractable headache, unspecified chronicity pattern, unspecified headache type  Secondary hypertension    Rx / DC Orders ED Discharge Orders          Ordered    butalbital-acetaminophen-caffeine (FIORICET) 50-325-40 MG tablet  Every 6 hours PRN        03/24/23 1855              Virgina Norfolk, DO 03/24/23 1856

## 2023-03-30 ENCOUNTER — Encounter (HOSPITAL_BASED_OUTPATIENT_CLINIC_OR_DEPARTMENT_OTHER): Payer: Self-pay | Admitting: Orthopaedic Surgery

## 2023-03-31 ENCOUNTER — Telehealth: Payer: Self-pay

## 2023-03-31 NOTE — Progress Notes (Signed)
Care Management & Coordination Services Pharmacy Team  Reason for Encounter: Initial Appointment Reminder  Contacted patient to confirm in office appointment with Al Corpus, PharmD on 04/05/2023 at 9:00.  Unsuccessful outreach. Left voicemail for patient to return call.  Star Rating Drugs:  Medication:  Last Fill: Day Supply None noted  Care Gaps: Annual wellness visit in last year? Yes 08/15/2022  Al Corpus, PharmD notified  Claudina Lick, RMA Clinical Pharmacy Assistant [phone]

## 2023-04-05 ENCOUNTER — Encounter: Payer: Medicare Other | Admitting: Pharmacist

## 2023-04-05 NOTE — Progress Notes (Unsigned)
Care Management & Coordination Services Pharmacy Note  04/05/2023 Name:  Dana Greer MRN:  [id number] DOB:  Oct 11, 1971  Summary: ***  Recommendations/Changes made from today's visit: ***  Follow up plan: ***   Subjective: Dana Greer is an 52 y.o. year old female who is a primary patient of [name], Audrie Gallus, MD.  The care coordination team was consulted for assistance with disease management and care coordination needs.    Engaged with patient face to face for initial visit.  Recent office visits: 11/03/22 Dr Milinda Antis OV: f/u - HTN urgency in s/o pain/distress recently. Improved with amlodipine 10 mg.   Recent consult visits: 03/24/23 Dr Christell Constant (Ortho): back pain, scoliosis. Limited role conservative tx (PT, injections). Referred to [facility] neurosurgery.  03/10/23 Dr Steward Drone (Ortho): back pain - refer to Dr Christell Constant for scoliosis correction.   03/08/23 Dr Iva Lento (Ortho): finger pain - intolerant to lyrica, gabapentin. Rx Medrol dosepak.   02/09/23 Dr Steward Drone (Ortho): back pain - Rx medrol dosepak. Ordered Xray, MRI.  Hospital visits: 03/24/23 ED visit ([facility]): nonintractable HA, HTN.  BP 160/100. Has not been taking amlodipine. HA cocktail given, gave dose of amlodipine 5 mg. Advised to take 5 mg at home. Rx Fioricet.   03/09/23 ED visit [facility]): chest pain - CT wnl. Take BP meds at home. Rx dicyclomien and ondansetron for abd sx.  10/08/22 ED visit ([facility]): L arm swelling. Given doxy + clinda as precaution.  10/05/22 - 10/08/22 Admission [facility]): AKI, HTN urgency. Started amlodipine 10 mg. EF 45-50%. Schedule outpatient stress test 12/14 - results reassuring.  Objective:  Lab Results  Component Value Date   CREATININE 1.13 (H) 03/09/2023   BUN 10 03/09/2023   GFR 75.72 11/03/2022   GFRNONAA 59 (L) 03/09/2023   GFRAA >60 05/18/2020   NA 141 03/09/2023   K 3.5 03/09/2023   CALCIUM 9.7 03/09/2023   CO2 21 (L) 03/09/2023   GLUCOSE 151 (H) 03/09/2023    Lab Results  Component  Value Date/Time   HGBA1C 6.0 06/20/2022 11:48 AM   HGBA1C 5.6 05/15/2020 05:02 PM   GFR 75.72 11/03/2022 10:03 AM   GFR 55.05 (L) 07/05/2022 08:32 AM    Last diabetic Eye exam: No results found for: "HMDIABEYEEXA"  Last diabetic Foot exam: No results found for: "HMDIABFOOTEX"   Lab Results  Component Value Date   CHOL 119 04/10/2019   HDL 39.70 04/10/2019   LDLCALC 68 04/10/2019   TRIG 56.0 04/10/2019   CHOLHDL 3 04/10/2019       Latest Ref Rng & Units 03/09/2023    2:25 PM 10/08/2022    1:24 AM 10/07/2022    2:39 AM  Hepatic Function  Total Protein 6.5 - 8.1 g/dL 7.4  5.1  5.6   Albumin 3.5 - 5.0 g/dL 4.4  2.8  3.2   AST 15 - 41 U/L 20  17  22    ALT 0 - 44 U/L 13  15  14    Alk Phosphatase 38 - 126 U/L 49  31  39   Total Bilirubin 0.3 - 1.2 mg/dL 0.7  0.8  1.0     Lab Results  Component Value Date/Time   TSH 0.88 04/10/2019 10:28 AM   TSH 0.90 01/09/2018 02:44 PM       Latest Ref Rng & Units 03/09/2023    2:25 PM 11/03/2022   10:03 AM 10/08/2022    1:24 AM  CBC  WBC 4.0 - 10.5 K/uL 7.8  5.7  5.3  Hemoglobin 12.0 - 15.0 g/dL 16.1  09.6  04.5   Hematocrit 36.0 - 46.0 % 41.3  38.4  35.0   Platelets 150 - 400 K/uL 170  166.0  119     Lab Results  Component Value Date/Time   VD25OH 23.45 (L) 04/10/2019 10:28 AM   VD25OH 7.11 (L) 01/09/2018 02:44 PM   VITAMINB12 471 04/10/2019 10:28 AM   VITAMINB12 768 04/12/2018 03:12 PM    Clinical ASCVD: No  The ASCVD Risk score (Arnett DK, et al., 2019) failed to calculate for the following reasons:   The patient has a prior MI or stroke diagnosis        08/15/2022    1:14 PM 06/08/2022    9:18 AM 05/16/2022    8:25 AM  Depression screen PHQ 2/9  Decreased Interest 1 0 0  Down, Depressed, Hopeless 1 0 0  PHQ - 2 Score 2 0 0  Altered sleeping 2    Tired, decreased energy 0    Change in appetite 2    Feeling bad or failure about yourself  1    Trouble concentrating 1    Moving slowly or fidgety/restless 0    Suicidal  thoughts 0    PHQ-9 Score 8    Difficult doing work/chores Somewhat difficult       Social History   Tobacco Use  Smoking Status Never  Smokeless Tobacco Never   BP Readings from Last 3 Encounters:  03/24/23 (!) 160/103  03/24/23 (!) 161/104  03/09/23 (!) 155/105   Pulse Readings from Last 3 Encounters:  03/24/23 60  03/24/23 62  03/09/23 79   Wt Readings from Last 3 Encounters:  03/24/23 188 lb (85.3 kg)  03/09/23 188 lb 0.8 oz (85.3 kg)  11/03/22 188 lb 2 oz (85.3 kg)   BMI Readings from Last 3 Encounters:  03/24/23 35.52 kg/m  03/09/23 35.53 kg/m  11/03/22 35.55 kg/m    Allergies  Allergen Reactions   Trazodone And Nefazodone Nausea And Vomiting   Amoxicillin Other (See Comments)    Yeast infection    Trazodone Nausea And Vomiting   Carafate [Sucralfate] Nausea And Vomiting    Medications Reviewed Today     Reviewed by Penne Lash, Neysa Bonito, RT (Technologist) on 03/24/23 at 0906  Med List Status: <None>   Medication Order Taking? Sig Documenting Provider Last Dose Status Informant  ALPRAZolam (XANAX) 1 MG tablet [id number] No Take 1 mg by mouth 4 (four) times daily as needed for anxiety or sleep.  [provider] Taking Active Self           Med Note Judd Gaudier   Mon Apr 06, 2020 11:21 AM)    amitriptyline (ELAVIL) 25 MG tablet [id number] No Take 2 tablets (50 mg total) by mouth daily. Darlin Drop, DO Taking Active Self           Med Note [facility], CARLOS A   Thu Oct 20, 2022  1:11 PM)    amLODipine (NORVASC) 10 MG tablet [id number]  Take 1 tablet (10 mg total) by mouth daily. [name], Audrie Gallus, MD  Active   cloNIDine (CATAPRES - DOSED IN MG/24 HR) 0.2 mg/24hr patch [id number] No Place 1 patch (0.2 mg total) onto the skin once a week. [name], Audrie Gallus, MD Taking Active Self  cyclobenzaprine (FLEXERIL) 10 MG tablet [id number] No Take 1 tablet (10 mg total) by mouth at bedtime as needed for muscle spasms. [name], Audrie Gallus, MD Taking Active Self  dicyclomine (BENTYL) 20 MG tablet [id number]  Take 1 tablet (20 mg total) by mouth 3 (three) times daily as needed for spasms. Long, Arlyss Repress, MD  Active   EPINEPHrine (EPIPEN 2-PAK) 0.3 mg/0.3 mL IJ SOAJ injection [id number] No Inject 0.3 mg into the muscle as needed for anaphylaxis. Marcelyn Bruins, MD Taking Active Self  etonogestrel (NEXPLANON) 68 MG IMPL implant [id number] No Inject 1 each into the skin continuous. Reported on 01/27/2016 [provider] Taking Active Self           Med Note [facility], Christiana Pellant   Thu Oct 20, 2022  1:11 PM)    famotidine (PEPCID) 20 MG tablet [id number] No TAKE 1 TABLET BY MOUTH 2 TIMES DAILY  Patient taking differently: Take 20 mg by mouth 2 (two) times daily.   [name], Audrie Gallus, MD Taking Active Self  meclizine (ANTIVERT) 25 MG tablet [id number] No Take 50 mg by mouth as needed for dizziness. [provider] Taking Active Self           Med Note [facility] MENDEZ, Christiana Pellant   Thu Oct 20, 2022  1:11 PM)    methylPREDNISolone (MEDROL DOSEPAK) 4 MG TBPK tablet [id number]  Take per packet instructions Huel Cote, MD  Active   metoCLOPramide (REGLAN) 10 MG tablet [id number] No TAKE ONE TABLET BY MOUTH EVERY 6 HOURS AS NEEDED FOR NAUSEA OR VOMITING  Patient taking differently: Take 10 mg by mouth every 6 (six) hours as needed for vomiting or nausea.   [name], Audrie Gallus, MD Taking Active Self  ondansetron (ZOFRAN) 8 MG tablet [id number] No Take 8 mg by mouth 3 (three) times daily. [provider] Taking Active Self  ondansetron (ZOFRAN-ODT) 4 MG disintegrating tablet [id number]  Take 1 tablet (4 mg total) by mouth every 8 (eight) hours as needed for nausea or vomiting. Maia Plan, MD  Active   pantoprazole (PROTONIX) 40 MG tablet [id number]  Take 1 tablet (40 mg total) by mouth daily. Long, Arlyss Repress, MD  Active   promethazine (PHENERGAN) 25 MG suppository [id number] No Place 1 suppository (25 mg total) rectally every 6 (six) hours as  needed for nausea or vomiting. [name], Audrie Gallus, MD Taking Active Self  valACYclovir (VALTREX) 500 MG tablet [id number]  TAKE ONE TABLET (500 mg) BY MOUTH EVERY DAY [name], Audrie Gallus, MD  Active             SDOH:  (Social Determinants of Health) assessments and interventions performed: {yes/no:20286} SDOH Interventions    Flowsheet Row Telephone from 08/17/2022 in [facility] Coordination Clinical Support from 08/15/2022 in [facility] Clinical Support from 04/10/2019 in [facility] Clinical Support from 03/21/2018 in [facility] [place] [facility] [facility] Clinical Support from 10/21/2016 in [facility] [place]  SDOH Interventions       Food Insecurity Interventions --  [patient provided food banks and other resources] Intervention Not Indicated -- -- --  Housing Interventions --  [housing not requested] Intervention Not Indicated -- -- --  Transportation Interventions -- Intervention Not Indicated -- -- --  Utilities Interventions -- Intervention Not Indicated -- -- --  Alcohol Usage Interventions -- Intervention Not Indicated (Score <7) -- -- --  Depression Interventions/Treatment  -- -- PHQ2-9 Score <4 Follow-up Not Indicated --  [referral to PCP] --  [pt is being referred to PCP for further evaluation]  Financial Strain Interventions --  [id number] Referral -- -- --  Physical Activity Interventions -- Intervention Not Indicated -- -- --  Stress Interventions -- Intervention Not Indicated -- -- --       Medication Assistance: {MEDASSISTANCEINFO:25044}  Medication Access: Name and location of current pharmacy:  [facility] [address] [facility] - [place], [place] - [address] [facility] [address] [place] [address] Phone: [phone] Fax: [phone]  [facility] - [place], [place] - [address] [place]  [place] [address] Phone: [phone] Fax: [phone]  Within the past 30 days, how often has patient missed a dose of medication? *** Is a pillbox or other method used to improve adherence? {YES/NO:21197} Factors that may affect medication adherence? {CHL DESC; BARRIERS:21522} Are meds synced by current pharmacy? {YES/NO:21197} Are meds delivered by current pharmacy? {YES/NO:21197} Does patient experience delays in picking up medications due to transportation concerns? 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 Seen multiple orthopedic providers, recently referred to [facility] neurosurgery. -Current treatment  Medrol Dosepak (02/09/23) Cyclobenzaprine 10 mg PRN -Medications previously tried: ***  -{CCMPHARMDINTERVENTION:25122}  GI distress (Goal: ***) -{US controlled/uncontrolled:25276} -Current treatment  Pantoprazole 40 mg daily Ondansetron 8 mg Promethazine 25 mg suppository -Medications previously  tried: ***  -{CCMPHARMDINTERVENTION:25122}  Depression/Anxiety (Goal: ***) -{US controlled/uncontrolled:25276} -Current treatment: Amitriptyline 25 mg daily Alprazolam 1 mg QID prn -Medications previously tried/failed: *** -PHQ9: *** -GAD7: *** -Connected with Dr Evelene Croon for mental health support -Educated on {CCM mental health counseling:25127} -{CCMPHARMDINTERVENTION:25122}  Health Maintenance -Vaccine gaps: ***   ***

## 2023-04-06 ENCOUNTER — Other Ambulatory Visit: Payer: Self-pay | Admitting: Family Medicine

## 2023-04-06 NOTE — Telephone Encounter (Signed)
Last filled on 07/13/22 #4 patches with 5 refills, last OV was 11/04/22

## 2023-05-01 ENCOUNTER — Ambulatory Visit: Payer: Medicare Other | Admitting: Cardiovascular Disease

## 2023-05-12 ENCOUNTER — Encounter: Payer: Self-pay | Admitting: Orthopedic Surgery

## 2023-05-15 NOTE — Progress Notes (Signed)
Cardiology Office Note  Date:  05/22/2023   ID:  [name], [name] October 27, 1971, MRN [id number]  PCP:  Judy Pimple, MD   Chief Complaint  Patient presents with   Follow-up    Previously seen at [facility] location and is transferring cardiac care to [facility] office.  Denies new or acute cardiac problems/concerns today.  Patient would like to defer EKG today with recent EKG obtained at 03/29/23 available in chart.    HPI:  Dana Greer is a 52 y.o. female with a past medical history significant for  depression, headache, fibromyalgia,  hypertension, and stroke  nausea Scoliosis, back pain Who presents for new patient evaluation to the [facility] office, follow-up of her hypertension   Prior medical history reviewed with her on today's visit  ED 10/05/22. She has severe hypertension > 200 systolic.   Echocardiogram 10/06/2022 showed EF 45 to 50%, new wall motion abnormality when compared to previous echo.  Elevated troponin 437 felt to be demand ischemia Outpatient Lexiscan Myoview Stress test was low risk and no revascularization was indicated.   Cholecystitis was found on CTA.  She underwent a HIDA scan which was reassuring   treated conservatively with IV antibiotics and IV fluids.   ED 10/08/2022 for left arm swelling.  diagnosis included possible cellulitis versus abscess.  Bedside ultrasound was performed which revealed cobblestoning concerning for fluid in the soft tissue.  No surrounding erythema.     nausea for 11 years but her sweats have gotten worse over the past year.  She has not had any chest pains, shortness of breath, racing heartbeat since her hospitalization.  Her biggest issue and her scoliosis and she saw surgeon for this shoulder that there is nothing they could do surgically.  Her pain starts back and wraps around the left side of her chest.   Reports no shortness of breath nor dyspnea on exertion. Reports no chest pain, pressure, or tightness. No  edema, orthopnea, PND.  Needs back surgery, at [facility] Chronic back pain, limits ability to exercise Using a heating pad for comfort  Prior EKGs reviewed   PMH:   has a past medical history of Arthritis, Depression, Fibromyalgia, Headache(784.0), adenomatous polyp of colon (02/14/2020), Hypertension, Immune deficiency disorder (HCC), Migraine headache, Shingles, Stroke (HCC), and Thrombocytopenia (HCC) (10/01/2012).  PSH:    Past Surgical History:  Procedure Laterality Date   ABDOMINAL HYSTERECTOMY     CARPAL TUNNEL RELEASE Left 09/25/2018   Procedure: LEFT CARPAL TUNNEL RELEASE;  Surgeon: Cindee Salt, MD;  Location: [facility];  Service: Orthopedics;  Laterality: Left;   CESAREAN SECTION  2008   x1   PARTIAL HYSTERECTOMY  2012   due to fibroid   UPPER GASTROINTESTINAL ENDOSCOPY     WISDOM TOOTH EXTRACTION  1995    Current Outpatient Medications  Medication Sig Dispense Refill   ALPRAZolam (XANAX) 1 MG tablet Take 1 mg by mouth 4 (four) times daily as needed for anxiety or sleep.      amitriptyline (ELAVIL) 25 MG tablet Take 2 tablets (50 mg total) by mouth daily. 60 tablet 0   amLODipine (NORVASC) 10 MG tablet Take 1 tablet (10 mg total) by mouth daily. 90 tablet 3   butalbital-acetaminophen-caffeine (FIORICET) 50-325-40 MG tablet Take 1-2 tablets by mouth every 6 (six) hours as needed for headache. 20 tablet 0   cloNIDine (CATAPRES - DOSED IN MG/24 HR) 0.2 mg/24hr patch APPLY ONE PATCH TO THE SKIN EVERY WEEK. REMOVE OLD PATCH BEFORE APPLYING  NEW PATCH 4 patch 5   cloNIDine (CATAPRES) 0.1 MG tablet Take 1 tablet (0.1 mg total) by mouth 2 (two) times daily as needed (for pressure >170). 60 tablet 4   EPINEPHrine (EPIPEN 2-PAK) 0.3 mg/0.3 mL IJ SOAJ injection Inject 0.3 mg into the muscle as needed for anaphylaxis. 2 each 1   etonogestrel (NEXPLANON) 68 MG IMPL implant Inject 1 each into the skin continuous. Reported on 01/27/2016     famotidine (PEPCID) 20 MG tablet TAKE 1  TABLET BY MOUTH 2 TIMES DAILY (Patient taking differently: Take 20 mg by mouth 2 (two) times daily.) 60 tablet 3   metoCLOPramide (REGLAN) 10 MG tablet TAKE ONE TABLET BY MOUTH EVERY 6 HOURS AS NEEDED FOR NAUSEA OR VOMITING (Patient taking differently: Take 10 mg by mouth every 6 (six) hours as needed for vomiting or nausea.) 60 tablet 0   ondansetron (ZOFRAN) 8 MG tablet Take 8 mg by mouth 3 (three) times daily.     ondansetron (ZOFRAN-ODT) 4 MG disintegrating tablet Take 1 tablet (4 mg total) by mouth every 8 (eight) hours as needed for nausea or vomiting. 20 tablet 0   pantoprazole (PROTONIX) 40 MG tablet Take 1 tablet (40 mg total) by mouth daily. 30 tablet 0   promethazine (PHENERGAN) 25 MG suppository Place 1 suppository (25 mg total) rectally every 6 (six) hours as needed for nausea or vomiting. 20 each 0   valACYclovir (VALTREX) 500 MG tablet TAKE ONE TABLET (500 mg) BY MOUTH EVERY DAY 30 tablet 0   cyclobenzaprine (FLEXERIL) 10 MG tablet Take 1 tablet (10 mg total) by mouth at bedtime as needed for muscle spasms. 30 tablet 3   dicyclomine (BENTYL) 20 MG tablet Take 1 tablet (20 mg total) by mouth 3 (three) times daily as needed for spasms. 20 tablet 0   meclizine (ANTIVERT) 25 MG tablet Take 50 mg by mouth as needed for dizziness.     methylPREDNISolone (MEDROL DOSEPAK) 4 MG TBPK tablet Take per packet instructions 21 each 0   No current facility-administered medications for this visit.     Allergies:   Trazodone and nefazodone, Amoxicillin, Trazodone, and Carafate [sucralfate]   Social History:  The patient  reports that she has never smoked. She has never used smokeless tobacco. She reports current alcohol use. She reports current drug use. Drug: Marijuana.   Family History:   family history includes Breast cancer in her paternal aunt and paternal grandmother; COPD in her father; Colon cancer in her paternal grandmother; Diabetes in her father; Esophageal cancer in her cousin; Healthy  in her mother; Lung cancer in her paternal aunt and paternal uncle; Rectal cancer in her paternal grandmother.    Review of Systems: Review of Systems  Constitutional: Negative.   HENT: Negative.    Respiratory: Negative.    Cardiovascular: Negative.   Gastrointestinal: Negative.   Musculoskeletal: Negative.   Neurological: Negative.   Psychiatric/Behavioral: Negative.    All other systems reviewed and are negative.    PHYSICAL EXAM: VS:  BP (!) 110/92 (BP Location: Left Arm, Patient Position: Sitting, Cuff Size: Large)   Pulse 73   Ht 5\' 2"  (1.575 m)   Wt 190 lb (86.2 kg)   LMP 10/16/2019 (Within Days) Comment: had hysterectomy-still has half of her uterus and ovaries  SpO2 99%   BMI 34.75 kg/m  , BMI Body mass index is 34.75 kg/m. GEN: Well nourished, well developed, in no acute distress HEENT: normal Neck: no JVD, carotid bruits, or masses  Cardiac: RRR; 1/6 RSB SEM murmurs, rubs, or gallops,no edema  Respiratory:  clear to auscultation bilaterally, normal work of breathing GI: soft, nontender, nondistended, + BS MS: no deformity or atrophy Skin: warm and dry, no rash Neuro:  Strength and sensation are intact Psych: euthymic mood, full affect   Recent Labs: 10/06/2022: Magnesium 1.8 03/09/2023: ALT 13; B Natriuretic Peptide 37.0; BUN 10; Creatinine, Ser 1.13; Hemoglobin 14.2; Platelets 170; Potassium 3.5; Sodium 141    Lipid Panel Lab Results  Component Value Date   CHOL 119 04/10/2019   HDL 39.70 04/10/2019   LDLCALC 68 04/10/2019   TRIG 56.0 04/10/2019      Wt Readings from Last 3 Encounters:  05/22/23 190 lb (86.2 kg)  03/24/23 188 lb (85.3 kg)  03/09/23 188 lb 0.8 oz (85.3 kg)       ASSESSMENT AND PLAN:  Problem List Items Addressed This Visit     Obesity (BMI 30-39.9) (Chronic)   Scoliosis   Other Visit Diagnoses     Chest pain of uncertain etiology    -  Primary   Essential hypertension       Relevant Medications   cloNIDine (CATAPRES)  0.1 MG tablet   Cerebral arteriosclerosis with history of previous stroke       Relevant Medications   cloNIDine (CATAPRES) 0.1 MG tablet      Preop cardiovascular evaluation for back surgery History of scoliosis, reports chronic back pain Acceptable risk for back surgery, no further cardiac testing needed  Essential hypertension Blood pressure relatively well-controlled on current medication regiment Recommend she continue her clonidine, amlodipine For any spikes in pressure recommended she take extra clonidine 0.1 pill  Hyperlipidemia Currently not on a statin prior lipids appeared relatively well-controlled  GERD On PPI  Cardiomyopathy Prior stress test, low risk EF estimated 45 to 50% in December 2023 Appears euvolemic, blood pressure well-controlled No further testing at this time   Total encounter time more than 40 minutes  Greater than 50% was spent in counseling and coordination of care with the patient    Signed, Dossie Arbour, M.D., Ph.D. [facility] [place], [place] [phone]

## 2023-05-16 ENCOUNTER — Other Ambulatory Visit: Payer: Self-pay | Admitting: Orthopedic Surgery

## 2023-05-16 NOTE — Telephone Encounter (Signed)
Referral refaxed to [facility]

## 2023-05-22 ENCOUNTER — Encounter: Payer: Self-pay | Admitting: Cardiovascular Disease

## 2023-05-22 ENCOUNTER — Ambulatory Visit: Payer: 59 | Attending: Cardiovascular Disease | Admitting: Cardiovascular Disease

## 2023-05-22 VITALS — BP 110/92 | HR 73 | Ht 62.0 in | Wt 190.0 lb

## 2023-05-22 DIAGNOSIS — I672 Cerebral atherosclerosis: Secondary | ICD-10-CM | POA: Diagnosis not present

## 2023-05-22 DIAGNOSIS — R079 Chest pain, unspecified: Secondary | ICD-10-CM | POA: Diagnosis not present

## 2023-05-22 DIAGNOSIS — E669 Obesity, unspecified: Secondary | ICD-10-CM

## 2023-05-22 DIAGNOSIS — I10 Essential (primary) hypertension: Secondary | ICD-10-CM | POA: Diagnosis not present

## 2023-05-22 DIAGNOSIS — M4125 Other idiopathic scoliosis, thoracolumbar region: Secondary | ICD-10-CM

## 2023-05-22 DIAGNOSIS — Z8673 Personal history of transient ischemic attack (TIA), and cerebral infarction without residual deficits: Secondary | ICD-10-CM

## 2023-05-22 MED ORDER — CLONIDINE HCL 0.1 MG PO TABS: 0.1000 mg | ORAL_TABLET | Freq: Two times a day (BID) | ORAL | 4 refills | Status: DC | PRN

## 2023-05-22 NOTE — Patient Instructions (Addendum)
Medication Instructions:  Clonidine 0.1 mg up to twice a day as needed for pressure 170  If you need a refill on your cardiac medications before your next appointment, please call your pharmacy.   Lab work: No new labs needed  Testing/Procedures: No new testing needed  Follow-Up: At [facility], you and your health needs are our priority.  As part of our continuing mission to provide you with exceptional heart care, we have created designated Provider Care Teams.  These Care Teams include your primary Cardiologist (physician) and Advanced Practice Providers (APPs -  Physician Assistants and Nurse Practitioners) who all work together to provide you with the care you need, when you need it.  You will need a follow up appointment in 6 months  Providers on your designated Care Team:   Nicolasa Ducking, NP Eula Listen, PA-C Cadence Fransico Michael, [place]  COVID-19 Vaccine Information can be found at: [email or web address] For questions related to vaccine distribution or appointments, please email [email or web address] or call [phone].

## 2023-05-24 ENCOUNTER — Encounter: Payer: Self-pay | Admitting: Cardiovascular Disease

## 2023-05-24 ENCOUNTER — Emergency Department (HOSPITAL_BASED_OUTPATIENT_CLINIC_OR_DEPARTMENT_OTHER)
Admission: EM | Admit: 2023-05-24 | Discharge: 2023-05-25 | Disposition: A | Discharge status: Home / Self Care | Payer: 59 | Attending: Emergency Medicine | Admitting: Emergency Medicine

## 2023-05-24 ENCOUNTER — Other Ambulatory Visit: Payer: Self-pay

## 2023-05-24 ENCOUNTER — Emergency Department (HOSPITAL_BASED_OUTPATIENT_CLINIC_OR_DEPARTMENT_OTHER): Payer: 59 | Admitting: Radiology

## 2023-05-24 DIAGNOSIS — I10 Essential (primary) hypertension: Secondary | ICD-10-CM | POA: Insufficient documentation

## 2023-05-24 DIAGNOSIS — Z79899 Other long term (current) drug therapy: Secondary | ICD-10-CM | POA: Insufficient documentation

## 2023-05-24 DIAGNOSIS — M25512 Pain in left shoulder: Secondary | ICD-10-CM | POA: Insufficient documentation

## 2023-05-24 DIAGNOSIS — M79602 Pain in left arm: Secondary | ICD-10-CM | POA: Diagnosis present

## 2023-05-24 LAB — BASIC METABOLIC PANEL
Anion gap: 8 (ref 5–15)
BUN: 15 mg/dL (ref 6–20)
CO2: 27 mmol/L (ref 22–32)
Calcium: 9.5 mg/dL (ref 8.9–10.3)
Chloride: 105 mmol/L (ref 98–111)
Creatinine, Ser: 1.15 mg/dL — ABNORMAL HIGH (ref 0.44–1.00)
GFR, Estimated: 57 mL/min — ABNORMAL LOW (ref >60)
Glucose, Bld: 99 mg/dL (ref 70–99)
Potassium: 4.1 mmol/L (ref 3.5–5.1)
Sodium: 140 mmol/L (ref 135–145)

## 2023-05-24 LAB — CBC
HCT: 38.4 % (ref 36.0–46.0)
Hemoglobin: 12.9 g/dL (ref 12.0–15.0)
MCH: 33.5 pg (ref 26.0–34.0)
MCHC: 33.6 g/dL (ref 30.0–36.0)
MCV: 99.7 fL (ref 80.0–100.0)
Platelets: 157 10*3/uL (ref 150–400)
RBC: 3.85 MIL/uL — ABNORMAL LOW (ref 3.87–5.11)
RDW: 13.6 % (ref 11.5–15.5)
WBC: 9 10*3/uL (ref 4.0–10.5)
nRBC: 0 % (ref 0.0–0.2)

## 2023-05-24 LAB — TROPONIN I (HIGH SENSITIVITY): Troponin I (High Sensitivity): 2 ng/L (ref <18)

## 2023-05-24 NOTE — ED Triage Notes (Signed)
Chest pressure- started this afternoon. Reports left arm pain as well. +N/-V. -SOB.  HX HTN

## 2023-05-25 LAB — TROPONIN I (HIGH SENSITIVITY): Troponin I (High Sensitivity): 2 ng/L (ref <18)

## 2023-05-25 NOTE — Discharge Instructions (Signed)
Take ibuprofen 600 mg every 6 hours as needed for pain.  Rest.  Follow-up with your primary doctor if symptoms or not improving in the next few days, and return to the ER if symptoms significantly worsen or change.

## 2023-05-25 NOTE — ED Notes (Signed)
Pt verbalized understanding of d/c instructions, meds, and followup care. Denies questions. VSS, no distress noted. Steady gait to exit with all belongings.  ?

## 2023-05-25 NOTE — ED Provider Notes (Signed)
[place] EMERGENCY DEPARTMENT AT [facility] Provider Note   CSN: [id number] Arrival date & time: 05/24/23  2039     History  Chief Complaint  Patient presents with   Chest Pain    Dana Greer is a 52 y.o. female.  Patient is a 52 year old female with past medical history of polycystic ovaries, hypertension, prediabetes, cyclic vomiting.  Patient presenting today for evaluation of left arm pain.  This began several days ago in the absence of any injury or trauma.  She describes a pain to the back of the left shoulder and left arm that causes her difficulty to raise her arm above her head.  She denies any chest pain or shortness of breath.  She denies any numbness or visual disturbances.  The history is provided by the patient.       Home Medications Prior to Admission medications   Medication Sig Start Date End Date Taking? Authorizing Provider  ALPRAZolam Prudy Feeler) 1 MG tablet Take 1 mg by mouth 4 (four) times daily as needed for anxiety or sleep.     [provider]  amitriptyline (ELAVIL) 25 MG tablet Take 2 tablets (50 mg total) by mouth daily. 05/18/20   Darlin Drop, DO  amLODipine (NORVASC) 10 MG tablet Take 1 tablet (10 mg total) by mouth daily. 12/06/22   [name], Audrie Gallus, MD  butalbital-acetaminophen-caffeine (FIORICET) [phone] MG tablet Take 1-2 tablets by mouth every 6 (six) hours as needed for headache. 03/24/23 03/23/24  Curatolo, Adam, DO  cloNIDine (CATAPRES - DOSED IN MG/24 HR) 0.2 mg/24hr patch APPLY ONE PATCH TO THE SKIN EVERY WEEK. REMOVE OLD PATCH BEFORE APPLYING NEW PATCH 04/06/23   [name], Audrie Gallus, MD  cloNIDine (CATAPRES) 0.1 MG tablet Take 1 tablet (0.1 mg total) by mouth 2 (two) times daily as needed (for pressure >170). 05/22/23   Antonieta Iba, MD  cyclobenzaprine (FLEXERIL) 10 MG tablet Take 1 tablet (10 mg total) by mouth at bedtime as needed for muscle spasms. 10/05/21   [name], Audrie Gallus, MD  dicyclomine (BENTYL) 20 MG tablet Take  1 tablet (20 mg total) by mouth 3 (three) times daily as needed for spasms. 03/09/23   Long, Arlyss Repress, MD  EPINEPHrine (EPIPEN 2-PAK) 0.3 mg/0.3 mL IJ SOAJ injection Inject 0.3 mg into the muscle as needed for anaphylaxis. 12/30/21   Marcelyn Bruins, MD  etonogestrel (NEXPLANON) 68 MG IMPL implant Inject 1 each into the skin continuous. Reported on 01/27/2016    [provider]  famotidine (PEPCID) 20 MG tablet TAKE 1 TABLET BY MOUTH 2 TIMES DAILY Patient taking differently: Take 20 mg by mouth 2 (two) times daily. 01/25/22   [name], Audrie Gallus, MD  meclizine (ANTIVERT) 25 MG tablet Take 50 mg by mouth as needed for dizziness.    [provider]  methylPREDNISolone (MEDROL DOSEPAK) 4 MG TBPK tablet Take per packet instructions 02/09/23   Huel Cote, MD  metoCLOPramide (REGLAN) 10 MG tablet TAKE ONE TABLET BY MOUTH EVERY 6 HOURS AS NEEDED FOR NAUSEA OR VOMITING Patient taking differently: Take 10 mg by mouth every 6 (six) hours as needed for vomiting or nausea. 09/30/22   [name], Audrie Gallus, MD  ondansetron (ZOFRAN) 8 MG tablet Take 8 mg by mouth 3 (three) times daily. 06/24/21   [provider]  ondansetron (ZOFRAN-ODT) 4 MG disintegrating tablet Take 1 tablet (4 mg total) by mouth every 8 (eight) hours as needed for nausea or vomiting. 03/09/23   Long, Arlyss Repress,  MD  pantoprazole (PROTONIX) 40 MG tablet Take 1 tablet (40 mg total) by mouth daily. 03/09/23   Long, Arlyss Repress, MD  promethazine (PHENERGAN) 25 MG suppository Place 1 suppository (25 mg total) rectally every 6 (six) hours as needed for nausea or vomiting. 04/26/22   [name], Audrie Gallus, MD  valACYclovir (VALTREX) 500 MG tablet TAKE ONE TABLET (500 mg) BY MOUTH EVERY DAY 02/08/23   [name], Audrie Gallus, MD      Allergies    Trazodone and nefazodone, Amoxicillin, Trazodone, and Carafate [sucralfate]    Review of Systems   Review of Systems  All other systems reviewed and are negative.   Physical Exam Updated Vital Signs BP  (!) 141/94   Pulse 63   Temp 98.7 F (37.1 C)   Resp 18   LMP 10/16/2019 (Within Days) Comment: had hysterectomy-still has half of her uterus and ovaries  SpO2 100%  Physical Exam Vitals and nursing note reviewed.  Constitutional:      General: She is not in acute distress.    Appearance: She is well-developed. She is not diaphoretic.  HENT:     Head: Normocephalic and atraumatic.  Cardiovascular:     Rate and Rhythm: Normal rate and regular rhythm.     Heart sounds: No murmur heard.    No friction rub. No gallop.  Pulmonary:     Effort: Pulmonary effort is normal. No respiratory distress.     Breath sounds: Normal breath sounds. No wheezing.  Abdominal:     General: Bowel sounds are normal. There is no distension.     Palpations: Abdomen is soft.     Tenderness: There is no abdominal tenderness.  Musculoskeletal:        General: Normal range of motion.     Cervical back: Normal range of motion and neck supple.     Comments: There is no cervical spine tenderness.  She has good range of motion of her neck.  The left arm is grossly normal in appearance.  Sensation and strength are intact throughout all extremities.  Ulnar and radial pulses are easily palpable and motor and sensation are intact throughout the hand.  Skin:    General: Skin is warm and dry.  Neurological:     General: No focal deficit present.     Mental Status: She is alert and oriented to person, place, and time.     ED Results / Procedures / Treatments   Labs (all labs ordered are listed, but only abnormal results are displayed) Labs Reviewed  BASIC METABOLIC PANEL - Abnormal; Notable for the following components:      Result Value   Creatinine, Ser 1.15 (*)    GFR, Estimated 57 (*)    All other components within normal limits  CBC - Abnormal; Notable for the following components:   RBC 3.85 (*)    All other components within normal limits  TROPONIN I (HIGH SENSITIVITY)  TROPONIN I (HIGH SENSITIVITY)     EKG ED ECG REPORT   Date: 05/25/2023  Rate: 54  Rhythm: sinus bradycardia  QRS Axis: normal  Intervals: normal  ST/T Wave abnormalities: normal  Conduction Disutrbances:none  Narrative Interpretation:   Old EKG Reviewed: unchanged  I have personally reviewed the EKG tracing and agree with the computerized printout as noted.   Radiology DG Chest 2 View  Result Date: 05/24/2023 CLINICAL DATA:  Chest pain EXAM: CHEST - 2 VIEW COMPARISON:  03/09/2023 FINDINGS: Severe convex rightward scoliosis in the thoracic spine.  Heart and mediastinal contours are within normal limits. No focal opacities or effusions. No acute bony abnormality. IMPRESSION: No active cardiopulmonary disease. Electronically Signed   By: Charlett Nose M.D.   On: 05/24/2023 21:19    Procedures Procedures    Medications Ordered in ED Medications - No data to display  ED Course/ Medical Decision Making/ A&P  Patient with past medical history as per HPI presenting with complaints of left arm pain.  She arrives here with stable vital signs, is afebrile, and has physical examination which is unremarkable.  Workup initiated including CBC, metabolic panel, and troponin x 2.  All studies unremarkable.  Chest x-ray shows no acute process.  Patient has been observed for 4 hours.  During that period of time symptoms have resolved.  She has had 2 negative troponins and an unchanged EKG.  I highly doubt a cardiac etiology, and I am further reassured by a normal stress test in December 2023.  Symptoms also inconsistent with stroke.  I suspect some sort of muscle strain or impingement.  Patient to take NSAIDs and follow-up as needed if not improving.  Final Clinical Impression(s) / ED Diagnoses Final diagnoses:  None    Rx / DC Orders ED Discharge Orders     None         Geoffery Lyons, MD 05/25/23 [phone]

## 2023-05-31 ENCOUNTER — Telehealth: Payer: Self-pay | Admitting: *Deleted

## 2023-05-31 NOTE — Telephone Encounter (Signed)
Transition Care Management Follow-up Telephone Call Date of discharge and from where: [facility] 05/25/2023 How have you been since you were released from the hospital? Feeling great Any questions or concerns? No  Items Reviewed: Did the pt receive and understand the discharge instructions provided? Yes  Medications obtained and verified? No  Other? No  Any new allergies since your discharge? No  Dietary orders reviewed? No Do you have support at home? Yes , Has transportation and medication     Follow up appointments reviewed:  PCP Hospital f/u appt confirmed? Yes  Scheduled to see Pcp on 06/07/2023 Specialist Hospital f/u appt confirmed?  Are transportation arrangements needed? No  If their condition worsens, is the pt aware to call PCP or go to the Emergency Dept.? Yes Was the patient provided with contact information for the PCP's office or ED? Yes Was to pt encouraged to call back with questions or concerns? Yes

## 2023-06-05 ENCOUNTER — Encounter: Payer: Self-pay | Admitting: Orthopedic Surgery

## 2023-06-07 ENCOUNTER — Ambulatory Visit (INDEPENDENT_AMBULATORY_CARE_PROVIDER_SITE_OTHER): Payer: 59 | Admitting: Family Medicine

## 2023-06-07 VITALS — BP 124/72 | HR 55 | Temp 97.6°F | Ht 62.0 in | Wt 189.1 lb

## 2023-06-07 DIAGNOSIS — R7303 Prediabetes: Secondary | ICD-10-CM

## 2023-06-07 DIAGNOSIS — M4125 Other idiopathic scoliosis, thoracolumbar region: Secondary | ICD-10-CM

## 2023-06-07 DIAGNOSIS — E538 Deficiency of other specified B group vitamins: Secondary | ICD-10-CM

## 2023-06-07 DIAGNOSIS — E559 Vitamin D deficiency, unspecified: Secondary | ICD-10-CM | POA: Diagnosis not present

## 2023-06-07 DIAGNOSIS — I10 Essential (primary) hypertension: Secondary | ICD-10-CM

## 2023-06-07 LAB — VITAMIN B12: Vitamin B-12: 284 pg/mL (ref 211–911)

## 2023-06-07 LAB — LIPID PANEL
Cholesterol: 121 mg/dL (ref 0–200)
HDL: 45.8 mg/dL
LDL Cholesterol: 62 mg/dL (ref 0–99)
NonHDL: 75.49
Total CHOL/HDL Ratio: 3
Triglycerides: 67 mg/dL (ref 0.0–149.0)
VLDL: 13.4 mg/dL (ref 0.0–40.0)

## 2023-06-07 LAB — VITAMIN D 25 HYDROXY (VIT D DEFICIENCY, FRACTURES): VITD: 10.56 ng/mL — ABNORMAL LOW (ref 30.00–100.00)

## 2023-06-07 LAB — TSH: TSH: 0.71 u[IU]/mL (ref 0.35–5.50)

## 2023-06-07 LAB — HEMOGLOBIN A1C: Hgb A1c MFr Bld: 5.7 % (ref 4.6–6.5)

## 2023-06-07 NOTE — Progress Notes (Signed)
Subjective:    Patient ID: Dana Greer, female    DOB: [birth year], 52 y.o.   MRN: [id number]  HPI  Wt Readings from Last 3 Encounters:  06/07/23 189 lb 2 oz (85.8 kg)  05/22/23 190 lb (86.2 kg)  03/24/23 188 lb (85.3 kg)   34.59 kg/m  Vitals:   06/07/23 0954  BP: 124/72  Pulse: (!) 55  Temp: 97.6 F (36.4 C)  SpO2: 98%   Planning back surgery  Lot of pain   Otherwise feeling ok   Pt presents for follow up of HTN   bp is stable today  No cp or palpitations or headaches or edema  No side effects to medicines  BP Readings from Last 3 Encounters:  06/07/23 124/72  05/25/23 (!) 157/103  05/22/23 (!) 110/92     Clonidine patch 0.2 mg  Oral clonidine 0.1 mg bid prn (has not needed recently)  Amlodipine 10 mg daily   Pulse Readings from Last 3 Encounters:  06/07/23 (!) 55  05/25/23 63  05/22/23 73   Lab Results  Component Value Date   HGBA1C 6.0 06/20/2022    Lab Results  Component Value Date   CHOL 119 04/10/2019   HDL 39.70 04/10/2019   LDLCALC 68 04/10/2019   TRIG 56.0 04/10/2019   CHOLHDL 3 04/10/2019   Last vitamin D Lab Results  Component Value Date   VD25OH 23.45 (L) 04/10/2019   Not taking any vit D currently    Lab Results  Component Value Date   VITAMINB12 471 04/10/2019   Lab Results  Component Value Date   TSH 0.88 04/10/2019      Was seen at ER /[facility] on 7/24 for left arm pain  Symptoms resolved there   Per notes:  Patient with past medical history as per HPI presenting with complaints of left arm pain.  She arrives here with stable vital signs, is afebrile, and has physical examination which is unremarkable.   Workup initiated including CBC, metabolic panel, and troponin x 2.  All studies unremarkable.   Chest x-ray shows no acute process.   Patient has been observed for 4 hours.  During that period of time symptoms have resolved.  She has had 2 negative troponins and an unchanged EKG.  I highly doubt a  cardiac etiology, and I am further reassured by a normal stress test in December 2023.  Symptoms also inconsistent with stroke.  I suspect some sort of muscle strain or impingement.  Patient to take NSAIDs and follow-up as needed if not improving.   Neg troponin times 2 DG Chest 2 View  Result Date: 05/24/2023 CLINICAL DATA:  Chest pain EXAM: CHEST - 2 VIEW COMPARISON:  03/09/2023 FINDINGS: Severe convex rightward scoliosis in the thoracic spine. Heart and mediastinal contours are within normal limits. No focal opacities or effusions. No acute bony abnormality. IMPRESSION: No active cardiopulmonary disease. Electronically Signed   By: Charlett Nose M.D.   On: 05/24/2023 21:19    Sees cardiology  Reviewed last visit   Lab Results  Component Value Date   NA 140 05/24/2023   K 4.1 05/24/2023   CO2 27 05/24/2023   GLUCOSE 99 05/24/2023   BUN 15 05/24/2023   CREATININE 1.15 (H) 05/24/2023   CALCIUM 9.5 05/24/2023   GFR 75.72 11/03/2022   GFRNONAA 57 (L) 05/24/2023   Lab Results  Component Value Date   WBC 9.0 05/24/2023   HGB 12.9 05/24/2023   HCT 38.4 05/24/2023  MCV 99.7 05/24/2023   PLT 157 05/24/2023   Noted had normal stress test in 09/2022   Arm pain has not come back     Patient Active Problem List   Diagnosis Date Noted   Skin pain 01/08/2023   Ovarian cyst 11/05/2022   Elevated troponin 10/05/2022   Thickening of wall of gallbladder 10/05/2022   Anxiety 10/05/2022   Scoliosis 09/07/2022   Microscopic hematuria 07/05/2022   Heat exhaustion, unspecified, sequela 05/05/2022   Generalized weakness 05/05/2022   Obesity (BMI 30-39.9)    Rash and nonspecific skin eruption 11/15/2021   Perioral dermatitis 11/02/2021   Mixed incontinence 08/16/2021   Elevated serum creatinine 08/12/2021   Urinary frequency 08/12/2021   Herpes zoster without complication 05/04/2021   Hand pain, left 10/14/2020   Swelling of hand joint, left 10/14/2020   TMJ (temporomandibular joint  syndrome) 08/18/2020   Non-intractable cyclical vomiting 05/15/2020   Left-sided back pain 02/25/2020   Hx of adenomatous polyp of colon 02/14/2020   Seasonal allergic rhinitis 01/21/2020   Chronic cough 10/04/2019   Headache, post-traumatic, acute 01/28/2019   Head injury, closed 01/17/2019   Abnormal vaginal bleeding 07/06/2018   Neck pain 06/25/2018   Migraine, chronic, without aura 04/12/2018   B12 deficiency 01/10/2018   Foot pain, bilateral 01/09/2018   Hand paresthesia 01/09/2018   Vitamin D deficiency 01/09/2018   Upper abdominal pain 11/22/2016   Chronic myofascial pain 10/26/2016   Encounter for screening mammogram for breast cancer 10/25/2016   HSV-2 infection 08/17/2016   Adverse effects of medication 11/25/2015   Vertigo 10/08/2015   Carpal tunnel syndrome 03/16/2015   Chronic nausea 02/17/2015   Gout 12/22/2014   Vertiginous migraine 02/03/2014   Intractable migraine 08/31/2012   Osteoarthritis 05/11/2012   Female bladder prolapse 05/11/2012   ADJ DISORDER WITH MIXED ANXIETY & DEPRESSED MOOD 06/30/2008   HYPERHIDROSIS 01/25/2008   BACK PAIN, LUMBAR, CHRONIC 10/16/2007   REACTION, ACUTE STRESS W/EMOTIONAL DSTURB 08/01/2007   Prediabetes 08/01/2007   Hypertension 08/01/2007   Goiter 07/11/2007   POLYCYSTIC OVARIAN DISEASE 05/24/2007   HIRSUTISM 05/24/2007   Arthropathy, multiple sites 05/24/2007   Past Medical History:  Diagnosis Date   Arthritis    Depression    Fibromyalgia    Headache(784.0)    Hx of adenomatous polyp of colon 02/14/2020   Hypertension    Immune deficiency disorder (HCC)    Migraine headache    Shingles    Stroke (HCC)    Thrombocytopenia (HCC) 10/01/2012   Past Surgical History:  Procedure Laterality Date   ABDOMINAL HYSTERECTOMY     CARPAL TUNNEL RELEASE Left 09/25/2018   Procedure: LEFT CARPAL TUNNEL RELEASE;  Surgeon: Cindee Salt, MD;  Location: [facility];  Service: Orthopedics;  Laterality: Left;   CESAREAN  SECTION  2008   x1   PARTIAL HYSTERECTOMY  2012   due to fibroid   UPPER GASTROINTESTINAL ENDOSCOPY     WISDOM TOOTH EXTRACTION  1995   Social History   Tobacco Use   Smoking status: Never   Smokeless tobacco: Never  Vaping Use   Vaping status: Never Used  Substance Use Topics   Alcohol use: Yes    Comment: occasional   Drug use: Yes    Types: Marijuana    Comment: smokes small amount twice daily   Family History  Problem Relation Age of Onset   Healthy Mother    Diabetes Father    COPD Father    Breast cancer Paternal Aunt  Lung cancer Paternal Aunt    Lung cancer Paternal Uncle    Breast cancer Paternal Grandmother    Colon cancer Paternal Grandmother    Rectal cancer Paternal Grandmother    Esophageal cancer Cousin    Stomach cancer Neg Hx    Allergies  Allergen Reactions   Trazodone And Nefazodone Nausea And Vomiting   Amoxicillin Other (See Comments)    Yeast infection    Trazodone Nausea And Vomiting   Carafate [Sucralfate] Nausea And Vomiting   Current Outpatient Medications on File Prior to Visit  Medication Sig Dispense Refill   ALPRAZolam (XANAX) 1 MG tablet Take 1 mg by mouth 4 (four) times daily as needed for anxiety or sleep.      amitriptyline (ELAVIL) 25 MG tablet Take 2 tablets (50 mg total) by mouth daily. 60 tablet 0   amLODipine (NORVASC) 10 MG tablet Take 1 tablet (10 mg total) by mouth daily. 90 tablet 3   butalbital-acetaminophen-caffeine (FIORICET) 50-325-40 MG tablet Take 1-2 tablets by mouth every 6 (six) hours as needed for headache. 20 tablet 0   cloNIDine (CATAPRES - DOSED IN MG/24 HR) 0.2 mg/24hr patch APPLY ONE PATCH TO THE SKIN EVERY WEEK. REMOVE OLD PATCH BEFORE APPLYING NEW PATCH 4 patch 5   cloNIDine (CATAPRES) 0.1 MG tablet Take 1 tablet (0.1 mg total) by mouth 2 (two) times daily as needed (for pressure >170). 60 tablet 4   cyclobenzaprine (FLEXERIL) 10 MG tablet Take 1 tablet (10 mg total) by mouth at bedtime as needed for  muscle spasms. 30 tablet 3   EPINEPHrine (EPIPEN 2-PAK) 0.3 mg/0.3 mL IJ SOAJ injection Inject 0.3 mg into the muscle as needed for anaphylaxis. 2 each 1   etonogestrel (NEXPLANON) 68 MG IMPL implant Inject 1 each into the skin continuous. Reported on 01/27/2016     famotidine (PEPCID) 20 MG tablet TAKE 1 TABLET BY MOUTH 2 TIMES DAILY (Patient taking differently: Take 20 mg by mouth 2 (two) times daily.) 60 tablet 3   meclizine (ANTIVERT) 25 MG tablet Take 50 mg by mouth as needed for dizziness.     metoCLOPramide (REGLAN) 10 MG tablet TAKE ONE TABLET BY MOUTH EVERY 6 HOURS AS NEEDED FOR NAUSEA OR VOMITING (Patient taking differently: Take 10 mg by mouth every 6 (six) hours as needed for vomiting or nausea.) 60 tablet 0   ondansetron (ZOFRAN) 8 MG tablet Take 8 mg by mouth 3 (three) times daily.     pantoprazole (PROTONIX) 40 MG tablet Take 1 tablet (40 mg total) by mouth daily. 30 tablet 0   promethazine (PHENERGAN) 25 MG suppository Place 1 suppository (25 mg total) rectally every 6 (six) hours as needed for nausea or vomiting. 20 each 0   valACYclovir (VALTREX) 500 MG tablet TAKE ONE TABLET (500 mg) BY MOUTH EVERY DAY 30 tablet 0   No current facility-administered medications on file prior to visit.    Review of Systems  Constitutional:  Positive for fatigue. Negative for activity change, appetite change, fever and unexpected weight change.  HENT:  Negative for congestion, ear pain, rhinorrhea, sinus pressure and sore throat.   Eyes:  Negative for pain, redness and visual disturbance.  Respiratory:  Negative for cough, shortness of breath and wheezing.   Cardiovascular:  Negative for chest pain and palpitations.  Gastrointestinal:  Negative for abdominal pain, blood in stool, constipation and diarrhea.  Endocrine: Negative for polydipsia and polyuria.  Genitourinary:  Negative for dysuria, frequency and urgency.  Musculoskeletal:  Positive for  back pain. Negative for arthralgias and  myalgias.  Skin:  Negative for pallor and rash.  Allergic/Immunologic: Negative for environmental allergies.  Neurological:  Negative for dizziness, syncope and headaches.  Hematological:  Negative for adenopathy. Does not bruise/bleed easily.  Psychiatric/Behavioral:  Negative for decreased concentration and dysphoric mood. The patient is not nervous/anxious.        Objective:   Physical Exam Constitutional:      General: She is not in acute distress.    Appearance: Normal appearance. She is well-developed. She is obese. She is not ill-appearing or diaphoretic.  HENT:     Head: Normocephalic and atraumatic.     Mouth/Throat:     Mouth: Mucous membranes are moist.  Eyes:     General: No scleral icterus.    Conjunctiva/sclera: Conjunctivae normal.     Pupils: Pupils are equal, round, and reactive to light.  Neck:     Thyroid: No thyromegaly.     Vascular: No carotid bruit or JVD.  Cardiovascular:     Rate and Rhythm: Normal rate and regular rhythm.     Heart sounds: Normal heart sounds.     No gallop.  Pulmonary:     Effort: Pulmonary effort is normal. No respiratory distress.     Breath sounds: Normal breath sounds. No stridor. No wheezing, rhonchi or rales.  Abdominal:     General: There is no distension or abdominal bruit.     Palpations: Abdomen is soft.  Musculoskeletal:     Cervical back: Normal range of motion and neck supple.     Right lower leg: No edema.     Left lower leg: No edema.     Comments: Baseline thoracolumbar scoliosis   Lymphadenopathy:     Cervical: No cervical adenopathy.  Skin:    General: Skin is warm and dry.     Coloration: Skin is not pale.     Findings: No bruising or rash.  Neurological:     Mental Status: She is alert.     Cranial Nerves: No cranial nerve deficit.     Coordination: Coordination normal.     Deep Tendon Reflexes: Reflexes are normal and symmetric. Reflexes normal.  Psychiatric:        Mood and Affect: Mood normal.      Comments: Good mood today           Assessment & Plan:   Problem List Items Addressed This Visit       Cardiovascular and Mediastinum   Hypertension - Primary    bp in fair control at this time  BP Readings from Last 1 Encounters:  06/07/23 124/72   No changes needed Most recent labs reviewed  Disc lifstyle change with low sodium diet and exercise   Doing well with  Clonidine patch 0.2 mg  Oral clonidine 0.1 mg bid prn (or in between patches if unable to get) Amlodipine 10 mg daily  Reviewed most recent labs- from ER/ chem and cbc Her arm pain is better      Relevant Orders   Lipid panel   TSH     Musculoskeletal and Integument   Scoliosis    Mod to severe Planning surgery with [facility]  Has had cardiology clearance Blood pressure is stable         Other   Vitamin D deficiency    Lab today Not taking any Will advised with result Encouraged for bone and overall health      Relevant Orders  VITAMIN D 25 Hydroxy (Vit-D Deficiency, Fractures)   Prediabetes    A1c today disc imp of low glycemic diet and wt loss to prevent DM2       Relevant Orders   Hemoglobin A1c   B12 deficiency    Lab today      Relevant Orders   Vitamin B12

## 2023-06-07 NOTE — Assessment & Plan Note (Signed)
Mod to severe Planning surgery with [facility]  Has had cardiology clearance Blood pressure is stable

## 2023-06-07 NOTE — Patient Instructions (Signed)
Blood pressure looks good  Continue current medicines   Lab today for A1c and lipids and B12 and vitamin D  Take care of yourself

## 2023-06-07 NOTE — Assessment & Plan Note (Signed)
bp in fair control at this time  BP Readings from Last 1 Encounters:  06/07/23 124/72   No changes needed Most recent labs reviewed  Disc lifstyle change with low sodium diet and exercise   Doing well with  Clonidine patch 0.2 mg  Oral clonidine 0.1 mg bid prn (or in between patches if unable to get) Amlodipine 10 mg daily  Reviewed most recent labs- from ER/ chem and cbc Her arm pain is better

## 2023-06-07 NOTE — Assessment & Plan Note (Signed)
Lab today.

## 2023-06-07 NOTE — Assessment & Plan Note (Signed)
A1c today disc imp of low glycemic diet and wt loss to prevent DM2  

## 2023-06-07 NOTE — Assessment & Plan Note (Signed)
Lab today Not taking any Will advised with result Encouraged for bone and overall health

## 2023-06-08 ENCOUNTER — Telehealth: Payer: Self-pay

## 2023-06-08 NOTE — Telephone Encounter (Signed)
LMOM to CB in regards to labs. 

## 2023-06-18 ENCOUNTER — Encounter (HOSPITAL_BASED_OUTPATIENT_CLINIC_OR_DEPARTMENT_OTHER): Payer: Self-pay | Admitting: Orthopaedic Surgery

## 2023-06-19 ENCOUNTER — Other Ambulatory Visit (HOSPITAL_BASED_OUTPATIENT_CLINIC_OR_DEPARTMENT_OTHER): Payer: Self-pay

## 2023-06-19 ENCOUNTER — Ambulatory Visit (INDEPENDENT_AMBULATORY_CARE_PROVIDER_SITE_OTHER): Payer: 59

## 2023-06-19 ENCOUNTER — Ambulatory Visit (INDEPENDENT_AMBULATORY_CARE_PROVIDER_SITE_OTHER): Payer: 59 | Admitting: Student

## 2023-06-19 ENCOUNTER — Encounter (HOSPITAL_BASED_OUTPATIENT_CLINIC_OR_DEPARTMENT_OTHER): Payer: Self-pay | Admitting: Student

## 2023-06-19 ENCOUNTER — Encounter (HOSPITAL_BASED_OUTPATIENT_CLINIC_OR_DEPARTMENT_OTHER): Payer: Self-pay

## 2023-06-19 DIAGNOSIS — M542 Cervicalgia: Secondary | ICD-10-CM

## 2023-06-19 MED ORDER — METHOCARBAMOL 500 MG PO TABS
500.0000 mg | ORAL_TABLET | Freq: Four times a day (QID) | ORAL | 0 refills | Status: AC | PRN
  Filled 2023-06-19: qty 40, 10d supply, fill #0

## 2023-06-19 MED ORDER — METHYLPREDNISOLONE 4 MG PO TBPK
ORAL_TABLET | ORAL | 0 refills | Status: DC
  Filled 2023-06-19: qty 21, 6d supply, fill #0

## 2023-06-19 NOTE — Progress Notes (Signed)
Chief Complaint: Neck pain     History of Present Illness:    Dana Greer is a 52 y.o. female presenting today for evaluation of neck pain.  This began about 2 weeks ago without any known injury.  She has noticed difficulty moving her neck with range of motion and that the feeling is similar to whiplash, which she has had previously.  Notes that she does feel discomfort when swallowing.  Pain levels did not go below moderate, and pain radiates throughout the upper traps bilaterally but does not go past the shoulder.  Denies any numbness or tingling.  Has tried Tylenol without any relief as well as a heating pad which does help temporarily.   Surgical History:   None  PMH/PSH/Family History/Social History/Meds/Allergies:    Past Medical History:  Diagnosis Date   Arthritis    Depression    Fibromyalgia    Headache(784.0)    Hx of adenomatous polyp of colon 02/14/2020   Hypertension    Immune deficiency disorder (HCC)    Migraine headache    Shingles    Stroke (HCC)    Thrombocytopenia (HCC) 10/01/2012   Past Surgical History:  Procedure Laterality Date   ABDOMINAL HYSTERECTOMY     CARPAL TUNNEL RELEASE Left 09/25/2018   Procedure: LEFT CARPAL TUNNEL RELEASE;  Surgeon: Cindee Salt, MD;  Location: [facility];  Service: Orthopedics;  Laterality: Left;   CESAREAN SECTION  2008   x1   PARTIAL HYSTERECTOMY  2012   due to fibroid   UPPER GASTROINTESTINAL ENDOSCOPY     WISDOM TOOTH EXTRACTION  1995   Social History   Socioeconomic History   Marital status: Legally Separated    Spouse name: Not on file   Number of children: 3   Years of education: college   Highest education level: Some college, no degree  Occupational History   Occupation: Unemployed  Tobacco Use   Smoking status: Never   Smokeless tobacco: Never  Vaping Use   Vaping status: Never Used  Substance and Sexual Activity   Alcohol use: Yes     Comment: occasional   Drug use: Yes    Types: Marijuana    Comment: smokes small amount twice daily   Sexual activity: Not Currently    Birth control/protection: None  Other Topics Concern   Not on file  Social History Narrative   Lives at home with her children.   6 cups caffeine per day.   Right-handed.   Social Determinants of Health   Financial Resource Strain: Low Risk  (06/07/2023)   Overall Financial Resource Strain (CARDIA)    Difficulty of Paying Living Expenses: Not hard at all  Food Insecurity: Food Insecurity Present (06/07/2023)   Hunger Vital Sign    Worried About Running Out of Food in the Last Year: Often true    Ran Out of Food in the Last Year: Sometimes true  Transportation Needs: No Transportation Needs (06/07/2023)   PRAPARE - Administrator, Civil Service (Medical): No    Lack of Transportation (Non-Medical): No  Physical Activity: Insufficiently Active (06/07/2023)   Exercise Vital Sign    Days of Exercise per Week: 3 days    Minutes of Exercise per Session: 20 min  Stress: Stress Concern Present (06/07/2023)   [employer]  of Occupational Health - Occupational Stress Questionnaire    Feeling of Stress : Rather much  Social Connections: Moderately Isolated (06/07/2023)   Social Connection and Isolation Panel [NHANES]    Frequency of Communication with Friends and Family: Once a week    Frequency of Social Gatherings with Friends and Family: Once a week    Attends Religious Services: More than 4 times per year    Active Member of [employer] or Organizations: Yes    Attends Engineer, structural: More than 4 times per year    Marital Status: Separated   Family History  Problem Relation Age of Onset   Healthy Mother    Diabetes Father    COPD Father    Breast cancer Paternal Aunt    Lung cancer Paternal Aunt    Lung cancer Paternal Uncle    Breast cancer Paternal Grandmother    Colon cancer Paternal Grandmother    Rectal cancer Paternal  Grandmother    Esophageal cancer Cousin    Stomach cancer Neg Hx    Allergies  Allergen Reactions   Trazodone And Nefazodone Nausea And Vomiting   Amoxicillin Other (See Comments)    Yeast infection    Trazodone Nausea And Vomiting   Carafate [Sucralfate] Nausea And Vomiting   Current Outpatient Medications  Medication Sig Dispense Refill   methocarbamol (ROBAXIN) 500 MG tablet Take 1 tablet (500 mg total) by mouth every 6 (six) hours as needed for up to 10 days for muscle spasms. 40 tablet 0   methylPREDNISolone (MEDROL DOSEPAK) 4 MG TBPK tablet Take per packet instructions 21 tablet 0   ALPRAZolam (XANAX) 1 MG tablet Take 1 mg by mouth 4 (four) times daily as needed for anxiety or sleep.      amitriptyline (ELAVIL) 25 MG tablet Take 2 tablets (50 mg total) by mouth daily. 60 tablet 0   amLODipine (NORVASC) 10 MG tablet Take 1 tablet (10 mg total) by mouth daily. 90 tablet 3   butalbital-acetaminophen-caffeine (FIORICET) 50-325-40 MG tablet Take 1-2 tablets by mouth every 6 (six) hours as needed for headache. 20 tablet 0   cloNIDine (CATAPRES - DOSED IN MG/24 HR) 0.2 mg/24hr patch APPLY ONE PATCH TO THE SKIN EVERY WEEK. REMOVE OLD PATCH BEFORE APPLYING NEW PATCH 4 patch 5   cloNIDine (CATAPRES) 0.1 MG tablet Take 1 tablet (0.1 mg total) by mouth 2 (two) times daily as needed (for pressure >170). 60 tablet 4   cyclobenzaprine (FLEXERIL) 10 MG tablet Take 1 tablet (10 mg total) by mouth at bedtime as needed for muscle spasms. 30 tablet 3   EPINEPHrine (EPIPEN 2-PAK) 0.3 mg/0.3 mL IJ SOAJ injection Inject 0.3 mg into the muscle as needed for anaphylaxis. 2 each 1   etonogestrel (NEXPLANON) 68 MG IMPL implant Inject 1 each into the skin continuous. Reported on 01/27/2016     famotidine (PEPCID) 20 MG tablet TAKE 1 TABLET BY MOUTH 2 TIMES DAILY (Patient taking differently: Take 20 mg by mouth 2 (two) times daily.) 60 tablet 3   meclizine (ANTIVERT) 25 MG tablet Take 50 mg by mouth as needed for  dizziness.     metoCLOPramide (REGLAN) 10 MG tablet TAKE ONE TABLET BY MOUTH EVERY 6 HOURS AS NEEDED FOR NAUSEA OR VOMITING (Patient taking differently: Take 10 mg by mouth every 6 (six) hours as needed for vomiting or nausea.) 60 tablet 0   ondansetron (ZOFRAN) 8 MG tablet Take 8 mg by mouth 3 (three) times daily.  pantoprazole (PROTONIX) 40 MG tablet Take 1 tablet (40 mg total) by mouth daily. 30 tablet 0   promethazine (PHENERGAN) 25 MG suppository Place 1 suppository (25 mg total) rectally every 6 (six) hours as needed for nausea or vomiting. 20 each 0   valACYclovir (VALTREX) 500 MG tablet TAKE ONE TABLET (500 mg) BY MOUTH EVERY DAY 30 tablet 0   No current facility-administered medications for this visit.   No results found.  Review of Systems:   A ROS was performed including pertinent positives and negatives as documented in the HPI.  Physical Exam :   Constitutional: NAD and appears stated age Neurological: Alert and oriented Psych: Appropriate affect and cooperative Last menstrual period 10/16/2019.   Comprehensive Musculoskeletal Exam:    Tenderness to palpation with noted muscle tension throughout upper trapezius bilaterally.  No tenderness directly over cervical spine or paraspinal muscles.  Cervical range of motion limited with flexion, extension, and rotation to the left.  Bilateral shoulder range of motion full and equal with flexion, ER, and IR.  Grip strength 5/5 bilaterally.  Negative Spurling's.  Neurosensory intact.  Imaging:   Xray (cervical spine 4 views): Diffuse mild to moderate degenerative changes without evidence of acute abnormality    I personally reviewed and interpreted the radiographs.   Assessment:   52 y.o. female with neck pain ongoing for the past 2 weeks.  No evidence of radiating symptoms to suggest cervical radiculopathy, however pain does follow more of a muscular pattern throughout the upper trapezius.  I think she would benefit from use  of the muscle relaxer as well as physical therapy to alleviate this tension.  She might also be a candidate for dry needling.  X-rays taken today do show some degenerative changes throughout the cervical spine so we will continue to monitor this.  Will also plan to start a steroid taper for hopefully more immediate relief.  Can plan to return to clinic as needed for reevaluation should these measures fail to drastically improve her symptoms.  Plan :    - Start Medrol Dosepak - Robaxin 500 mg as needed - Referral to physical therapy     I personally saw and evaluated the patient, and participated in the management and treatment plan.  Hazle Nordmann, PA-C Orthopedics  This document was dictated using Conservation officer, historic buildings. A reasonable attempt at proof reading has been made to minimize errors.

## 2023-07-07 ENCOUNTER — Ambulatory Visit (INDEPENDENT_AMBULATORY_CARE_PROVIDER_SITE_OTHER): Payer: 59 | Admitting: Family Medicine

## 2023-07-07 ENCOUNTER — Encounter: Payer: Self-pay | Admitting: Family Medicine

## 2023-07-07 ENCOUNTER — Ambulatory Visit
Admission: RE | Admit: 2023-07-07 | Discharge: 2023-07-07 | Disposition: A | Discharge status: Home / Self Care | Payer: 59 | Source: Ambulatory Visit | Attending: Family Medicine | Admitting: Family Medicine

## 2023-07-07 ENCOUNTER — Other Ambulatory Visit: Payer: Self-pay | Admitting: Family Medicine

## 2023-07-07 VITALS — BP 114/80 | HR 50 | Temp 97.9°F | Ht 62.0 in | Wt 194.5 lb

## 2023-07-07 DIAGNOSIS — R1011 Right upper quadrant pain: Secondary | ICD-10-CM

## 2023-07-07 DIAGNOSIS — R109 Unspecified abdominal pain: Secondary | ICD-10-CM

## 2023-07-07 DIAGNOSIS — R319 Hematuria, unspecified: Secondary | ICD-10-CM

## 2023-07-07 LAB — POC URINALSYSI DIPSTICK (AUTOMATED)
Bilirubin, UA: NEGATIVE
Glucose, UA: NEGATIVE
Ketones, UA: NEGATIVE
Leukocytes, UA: NEGATIVE
Nitrite, UA: NEGATIVE
Protein, UA: NEGATIVE
Spec Grav, UA: 1.01 (ref 1.010–1.025)
Urobilinogen, UA: 0.2 U/dL
pH, UA: 8 (ref 5.0–8.0)

## 2023-07-07 MED ORDER — HYDROCODONE-ACETAMINOPHEN 5-325 MG PO TABS: 1.0000 | ORAL_TABLET | Freq: Three times a day (TID) | ORAL | 0 refills | Status: AC | PRN

## 2023-07-07 NOTE — Progress Notes (Signed)
Patient ID: Dana Greer, female    DOB: 1970/11/07, 52 y.o.   MRN: [id number]  This visit was conducted in person.  BP 114/80 (BP Location: Right Arm, Patient Position: Sitting, Cuff Size: Large)   Pulse (!) 50   Temp 97.9 F (36.6 C) (Temporal)   Ht 5\' 2"  (1.575 m)   Wt 194 lb 8 oz (88.2 kg)   LMP 10/16/2019 (Within Days) Comment: had hysterectomy-still has half of her uterus and ovaries  SpO2 99%   BMI 35.57 kg/m    CC:  Chief Complaint  Patient presents with   Back Pain    Right Side/Back x 1 week    Subjective:   HPI: Dana Greer is a 52 y.o. female patient of Dr. Lucretia Roers presenting on 07/07/2023 for Back Pain (Right Side/Back x 1 week)  History of arthropathy multiple joints, osteoarthritis, gout   Reviewed recent office visit note from Hazle Nordmann, [place] with orthopedic surgery for neck pain.  Prescribed methylprednisolone Dosepak and methocarbamol.  Referred to physical therapy Cervical spine x-ray showed IMPRESSION: 1. No fracture or acute finding. No spondylolisthesis. 2. No subluxation with flexion or extension. 3. Mild disc degenerative changes at C4-C5.   Also reviewed Mar 24, 2023 office visit with Dr. Christell Constant orthopedic surgery for chronic bilateral low back pain as well as thoracic lumbar scoliosis Referred to [facility] for consideration of deformity correction surgery... has appt in 10/204.  PDMP reviewed during this encounter.  Today she reports awoke earlier this week with new onset pain in right  abdominal pain radiating upwards.. lasted  1 day Now today awoke with right flank pain.. severe. Worse with leaning to right.  No dysuria, no change in frequency, chronic incontinence.  Has cold so sneeze a cough causing pain. No proceeding  fall or change in activity.   No history of kidney stones.   Using heating pad.   No diarrhea, no constipation.  No fever.  History of partial hysterectomy.  Has had blood in urine in past.. negative  urology work up in past. No history of nephrolithiasis.  Relevant past medical, surgical, family and social history reviewed and updated as indicated. Interim medical history since our last visit reviewed. Allergies and medications reviewed and updated. Outpatient Medications Prior to Visit  Medication Sig Dispense Refill   ALPRAZolam (XANAX) 1 MG tablet Take 1 mg by mouth 4 (four) times daily as needed for anxiety or sleep.      amitriptyline (ELAVIL) 25 MG tablet Take 2 tablets (50 mg total) by mouth daily. 60 tablet 0   amLODipine (NORVASC) 10 MG tablet Take 1 tablet (10 mg total) by mouth daily. 90 tablet 3   butalbital-acetaminophen-caffeine (FIORICET) 50-325-40 MG tablet Take 1-2 tablets by mouth every 6 (six) hours as needed for headache. 20 tablet 0   cloNIDine (CATAPRES - DOSED IN MG/24 HR) 0.2 mg/24hr patch APPLY ONE PATCH TO THE SKIN EVERY WEEK. REMOVE OLD PATCH BEFORE APPLYING NEW PATCH 4 patch 5   cloNIDine (CATAPRES) 0.1 MG tablet Take 1 tablet (0.1 mg total) by mouth 2 (two) times daily as needed (for pressure >170). 60 tablet 4   cyclobenzaprine (FLEXERIL) 10 MG tablet Take 1 tablet (10 mg total) by mouth at bedtime as needed for muscle spasms. 30 tablet 3   EPINEPHrine (EPIPEN 2-PAK) 0.3 mg/0.3 mL IJ SOAJ injection Inject 0.3 mg into the muscle as needed for anaphylaxis. 2 each 1   etonogestrel (NEXPLANON) 68 MG IMPL implant Inject 1  each into the skin continuous. Reported on 01/27/2016     famotidine (PEPCID) 20 MG tablet TAKE 1 TABLET BY MOUTH 2 TIMES DAILY 60 tablet 3   meclizine (ANTIVERT) 25 MG tablet Take 50 mg by mouth as needed for dizziness.     methylPREDNISolone (MEDROL DOSEPAK) 4 MG TBPK tablet Take per packet instructions 21 tablet 0   metoCLOPramide (REGLAN) 10 MG tablet TAKE ONE TABLET BY MOUTH EVERY 6 HOURS AS NEEDED FOR NAUSEA OR VOMITING (Patient taking differently: Take 10 mg by mouth every 6 (six) hours as needed for vomiting or nausea.) 60 tablet 0    ondansetron (ZOFRAN) 8 MG tablet Take 8 mg by mouth 3 (three) times daily.     pantoprazole (PROTONIX) 40 MG tablet Take 1 tablet (40 mg total) by mouth daily. 30 tablet 0   promethazine (PHENERGAN) 25 MG suppository Place 1 suppository (25 mg total) rectally every 6 (six) hours as needed for nausea or vomiting. 20 each 0   valACYclovir (VALTREX) 500 MG tablet TAKE ONE TABLET (500 mg) BY MOUTH EVERY DAY 30 tablet 0   No facility-administered medications prior to visit.     Per HPI unless specifically indicated in ROS section below Review of Systems  Constitutional:  Negative for fatigue and fever.  HENT:  Negative for congestion.   Eyes:  Negative for pain.  Respiratory:  Negative for cough and shortness of breath.   Cardiovascular:  Negative for chest pain, palpitations and leg swelling.  Gastrointestinal:  Positive for abdominal pain and nausea.  Genitourinary:  Negative for dysuria and vaginal bleeding.  Musculoskeletal:  Positive for back pain.  Neurological:  Negative for syncope, light-headedness and headaches.  Psychiatric/Behavioral:  Negative for dysphoric mood.    Objective:  BP 114/80 (BP Location: Right Arm, Patient Position: Sitting, Cuff Size: Large)   Pulse (!) 50   Temp 97.9 F (36.6 C) (Temporal)   Ht 5\' 2"  (1.575 m)   Wt 194 lb 8 oz (88.2 kg)   LMP 10/16/2019 (Within Days) Comment: had hysterectomy-still has half of her uterus and ovaries  SpO2 99%   BMI 35.57 kg/m   Wt Readings from Last 3 Encounters:  07/07/23 194 lb 8 oz (88.2 kg)  06/07/23 189 lb 2 oz (85.8 kg)  05/22/23 190 lb (86.2 kg)      Physical Exam Constitutional:      General: She is not in acute distress.    Appearance: Normal appearance. She is well-developed. She is not ill-appearing or toxic-appearing.  HENT:     Head: Normocephalic.     Right Ear: Hearing, tympanic membrane, ear canal and external ear normal. Tympanic membrane is not erythematous, retracted or bulging.     Left Ear:  Hearing, tympanic membrane, ear canal and external ear normal. Tympanic membrane is not erythematous, retracted or bulging.     Nose: No mucosal edema or rhinorrhea.     Right Sinus: No maxillary sinus tenderness or frontal sinus tenderness.     Left Sinus: No maxillary sinus tenderness or frontal sinus tenderness.     Mouth/Throat:     Mouth: Oropharynx is clear and moist and mucous membranes are normal.     Pharynx: Uvula midline.  Eyes:     General: Lids are normal. Lids are everted, no foreign bodies appreciated.     Extraocular Movements: EOM normal.     Conjunctiva/sclera: Conjunctivae normal.     Pupils: Pupils are equal, round, and reactive to light.  Neck:  Thyroid: No thyroid mass or thyromegaly.     Vascular: No carotid bruit.     Trachea: Trachea normal.  Cardiovascular:     Rate and Rhythm: Normal rate and regular rhythm.     Pulses: Normal pulses.     Heart sounds: Normal heart sounds, S1 normal and S2 normal. No murmur heard.    No friction rub. No gallop.  Pulmonary:     Effort: Pulmonary effort is normal. No tachypnea or respiratory distress.     Breath sounds: Normal breath sounds. No decreased breath sounds, wheezing, rhonchi or rales.  Abdominal:     General: Bowel sounds are normal.     Palpations: Abdomen is soft.     Tenderness: There is abdominal tenderness in the right upper quadrant. There is right CVA tenderness.     Hernia: No hernia is present.  Musculoskeletal:     Cervical back: Normal range of motion and neck supple.     Thoracic back: Decreased range of motion. Scoliosis present.     Lumbar back: Tenderness present. No bony tenderness. Decreased range of motion. Negative right straight leg raise test and negative left straight leg raise test. Scoliosis present.  Skin:    General: Skin is warm, dry and intact.     Findings: No rash.  Neurological:     Mental Status: She is alert.  Psychiatric:        Mood and Affect: Mood is not anxious or  depressed.        Speech: Speech normal.        Behavior: Behavior normal. Behavior is cooperative.        Thought Content: Thought content normal.        Cognition and Memory: Cognition and memory normal.        Judgment: Judgment normal.       Results for orders placed or performed in visit on 07/07/23  POCT Urinalysis Dipstick (Automated)  Result Value Ref Range   Color, UA Yellow    Clarity, UA Clear    Glucose, UA Negative Negative   Bilirubin, UA Negative    Ketones, UA Negative    Spec Grav, UA 1.010 1.010 - 1.025   Blood, UA Small (1+)    pH, UA 8.0 5.0 - 8.0   Protein, UA Negative Negative   Urobilinogen, UA 0.2 0.2 or 1.0 E.U./dL   Nitrite, UA Negative    Leukocytes, UA Negative Negative    Assessment and Plan  Right flank pain -     POCT Urinalysis Dipstick (Automated) -     CT RENAL STONE STUDY; Future  Right upper quadrant abdominal pain -     CT RENAL STONE STUDY; Future  Hematuria, unspecified type -     CT RENAL STONE STUDY; Future  New onset right abdominal pain now in right flank, associated with hematuria.  Concern for possible renal stone. Less likely in setting of history of chronic back pain and scoliosis possible pain originating from spine. Will evaluate with urgent renal CT stone study to evaluate for kidney stone. No sign of pyelonephritis or lower urinary tract infection on urinalysis. Push water, strain urine.  Return and ER precautions provided.  No follow-ups on file.   Kerby Nora, MD

## 2023-07-07 NOTE — Telephone Encounter (Signed)
Called patient set up visit with Dr. Ermalene Searing today.

## 2023-07-07 NOTE — Patient Instructions (Addendum)
Push water intake. Strain urine.  We will set up CT renal and we will call with results

## 2023-07-12 ENCOUNTER — Other Ambulatory Visit: Payer: Self-pay | Admitting: *Deleted

## 2023-07-12 MED ORDER — CLONIDINE 0.2 MG/24HR TD PTWK: 0.2000 mg | MEDICATED_PATCH | TRANSDERMAL | 1 refills | Status: DC

## 2023-07-20 ENCOUNTER — Encounter: Payer: Self-pay | Admitting: Cardiovascular Disease

## 2023-07-26 ENCOUNTER — Encounter: Payer: Self-pay | Admitting: Family Medicine

## 2023-07-26 DIAGNOSIS — R61 Generalized hyperhidrosis: Secondary | ICD-10-CM

## 2023-07-26 DIAGNOSIS — E282 Polycystic ovarian syndrome: Secondary | ICD-10-CM

## 2023-07-26 DIAGNOSIS — R55 Syncope and collapse: Secondary | ICD-10-CM

## 2023-07-26 DIAGNOSIS — R7303 Prediabetes: Secondary | ICD-10-CM

## 2023-07-26 DIAGNOSIS — E049 Nontoxic goiter, unspecified: Secondary | ICD-10-CM

## 2023-07-27 DIAGNOSIS — R55 Syncope and collapse: Secondary | ICD-10-CM | POA: Insufficient documentation

## 2023-07-27 DIAGNOSIS — R61 Generalized hyperhidrosis: Secondary | ICD-10-CM | POA: Insufficient documentation

## 2023-08-10 ENCOUNTER — Ambulatory Visit
Admission: RE | Admit: 2023-08-10 | Discharge: 2023-08-10 | Disposition: A | Discharge status: Home / Self Care | Payer: 59 | Source: Ambulatory Visit | Attending: Family Medicine | Admitting: Family Medicine

## 2023-08-10 DIAGNOSIS — N6320 Unspecified lump in the left breast, unspecified quadrant: Secondary | ICD-10-CM

## 2023-08-11 ENCOUNTER — Encounter: Payer: Self-pay | Admitting: Orthopedic Surgery

## 2023-08-14 ENCOUNTER — Encounter: Payer: Self-pay | Admitting: Orthopedic Surgery

## 2023-08-22 ENCOUNTER — Ambulatory Visit: Payer: 59

## 2023-08-22 VITALS — Ht 62.0 in | Wt 194.0 lb

## 2023-08-22 DIAGNOSIS — Z0000 Encounter for general adult medical examination without abnormal findings: Secondary | ICD-10-CM

## 2023-08-22 NOTE — Progress Notes (Signed)
Subjective:   Dana Greer is a 52 y.o. female who presents for Medicare Annual (Subsequent) preventive examination.  Visit Complete: Virtual I connected with  Dana Greer on 08/22/23 by a audio enabled telemedicine application and verified that I am speaking with the correct person using two identifiers.  Patient Location: Home  Provider Location: Office/Clinic  I discussed the limitations of evaluation and management by telemedicine. The patient expressed understanding and agreed to proceed.  Vital Signs: Because this visit was a virtual/telehealth visit, some criteria may be missing or patient reported. Any vitals not documented were not able to be obtained and vitals that have been documented are patient reported.  Patient Medicare AWV questionnaire was completed by the patient on (not done); I have confirmed that all information answered by patient is correct and no changes since this date.       Objective:    There were no vitals filed for this visit. There is no height or weight on file to calculate BMI.     05/24/2023    8:48 PM 03/24/2023    5:14 PM 10/05/2022    2:23 PM 10/04/2022    9:51 PM 08/15/2022    1:07 PM 07/09/2022    8:58 AM 06/29/2022    1:57 PM  Advanced Directives  Does Patient Have a Medical Advance Directive? No No No No No No No  Would patient like information on creating a medical advance directive?  No - Patient declined No - Patient declined No - Patient declined No - Patient declined No - Patient declined No - Patient declined    Current Medications (verified) Outpatient Encounter Medications as of 08/22/2023  Medication Sig   ALPRAZolam (XANAX) 1 MG tablet Take 1 mg by mouth 4 (four) times daily as needed for anxiety or sleep.    amitriptyline (ELAVIL) 25 MG tablet Take 2 tablets (50 mg total) by mouth daily.   amLODipine (NORVASC) 10 MG tablet Take 1 tablet (10 mg total) by mouth daily.   butalbital-acetaminophen-caffeine  (FIORICET) 50-325-40 MG tablet Take 1-2 tablets by mouth every 6 (six) hours as needed for headache.   cloNIDine (CATAPRES - DOSED IN MG/24 HR) 0.2 mg/24hr patch Place 1 patch (0.2 mg total) onto the skin once a week.   cloNIDine (CATAPRES) 0.1 MG tablet Take 1 tablet (0.1 mg total) by mouth 2 (two) times daily as needed (for pressure >170).   cyclobenzaprine (FLEXERIL) 10 MG tablet Take 1 tablet (10 mg total) by mouth at bedtime as needed for muscle spasms.   EPINEPHrine (EPIPEN 2-PAK) 0.3 mg/0.3 mL IJ SOAJ injection Inject 0.3 mg into the muscle as needed for anaphylaxis.   etonogestrel (NEXPLANON) 68 MG IMPL implant Inject 1 each into the skin continuous. Reported on 01/27/2016   famotidine (PEPCID) 20 MG tablet TAKE 1 TABLET BY MOUTH 2 TIMES DAILY   meclizine (ANTIVERT) 25 MG tablet Take 50 mg by mouth as needed for dizziness.   methylPREDNISolone (MEDROL DOSEPAK) 4 MG TBPK tablet Take per packet instructions   metoCLOPramide (REGLAN) 10 MG tablet TAKE ONE TABLET BY MOUTH EVERY 6 HOURS AS NEEDED FOR NAUSEA OR VOMITING (Patient taking differently: Take 10 mg by mouth every 6 (six) hours as needed for vomiting or nausea.)   ondansetron (ZOFRAN) 8 MG tablet Take 8 mg by mouth 3 (three) times daily.   pantoprazole (PROTONIX) 40 MG tablet Take 1 tablet (40 mg total) by mouth daily.   promethazine (PHENERGAN) 25 MG suppository Place 1 suppository (  25 mg total) rectally every 6 (six) hours as needed for nausea or vomiting.   valACYclovir (VALTREX) 500 MG tablet TAKE ONE TABLET (500 mg) BY MOUTH EVERY DAY   No facility-administered encounter medications on file as of 08/22/2023.    Allergies (verified) Trazodone and nefazodone, Amoxicillin, Trazodone, and Carafate [sucralfate]   History: Past Medical History:  Diagnosis Date   Arthritis    Depression    Fibromyalgia    Headache(784.0)    Hx of adenomatous polyp of colon 02/14/2020   Hypertension    Immune deficiency disorder (HCC)     Migraine headache    Shingles    Stroke (HCC)    Thrombocytopenia (HCC) 10/01/2012   Past Surgical History:  Procedure Laterality Date   ABDOMINAL HYSTERECTOMY     CARPAL TUNNEL RELEASE Left 09/25/2018   Procedure: LEFT CARPAL TUNNEL RELEASE;  Surgeon: Cindee Salt, MD;  Location: [facility];  Service: Orthopedics;  Laterality: Left;   CESAREAN SECTION  2008   x1   PARTIAL HYSTERECTOMY  2012   due to fibroid   UPPER GASTROINTESTINAL ENDOSCOPY     WISDOM TOOTH EXTRACTION  1995   Family History  Problem Relation Age of Onset   Healthy Mother    Diabetes Father    COPD Father    Breast cancer Paternal Aunt    Lung cancer Paternal Aunt    Lung cancer Paternal Uncle    Breast cancer Paternal Grandmother    Colon cancer Paternal Grandmother    Rectal cancer Paternal Grandmother    Esophageal cancer Cousin    Stomach cancer Neg Hx    Social History   Socioeconomic History   Marital status: Legally Separated    Spouse name: Not on file   Number of children: 3   Years of education: college   Highest education level: Some college, no degree  Occupational History   Occupation: Unemployed  Tobacco Use   Smoking status: Never   Smokeless tobacco: Never  Vaping Use   Vaping status: Never Used  Substance and Sexual Activity   Alcohol use: Yes    Comment: occasional   Drug use: Yes    Types: Marijuana    Comment: smokes small amount twice daily   Sexual activity: Not Currently    Birth control/protection: None  Other Topics Concern   Not on file  Social History Narrative   Lives at home with her children.   6 cups caffeine per day.   Right-handed.   Social Determinants of Health   Financial Resource Strain: Low Risk  (06/07/2023)   Overall Financial Resource Strain (CARDIA)    Difficulty of Paying Living Expenses: Not hard at all  Food Insecurity: Food Insecurity Present (06/07/2023)   Hunger Vital Sign    Worried About Running Out of Food in the Last  Year: Often true    Ran Out of Food in the Last Year: Sometimes true  Transportation Needs: No Transportation Needs (06/07/2023)   PRAPARE - Administrator, Civil Service (Medical): No    Lack of Transportation (Non-Medical): No  Physical Activity: Insufficiently Active (06/07/2023)   Exercise Vital Sign    Days of Exercise per Week: 3 days    Minutes of Exercise per Session: 20 min  Stress: Stress Concern Present (06/07/2023)   [employer] of Occupational Health - Occupational Stress Questionnaire    Feeling of Stress : Rather much  Social Connections: Moderately Isolated (06/07/2023)   Social Connection and Isolation  Panel [NHANES]    Frequency of Communication with Friends and Family: Once a week    Frequency of Social Gatherings with Friends and Family: Once a week    Attends Religious Services: More than 4 times per year    Active Member of [employer] or Organizations: Yes    Attends Engineer, structural: More than 4 times per year    Marital Status: Separated    Tobacco Counseling Counseling given: Not Answered   Clinical Intake:                        Activities of Daily Living    10/06/2022    3:00 PM  In your present state of health, do you have any difficulty performing the following activities:  Hearing? 0  Vision? 0  Difficulty concentrating or making decisions? 1  Walking or climbing stairs? 0  Dressing or bathing? 0  Doing errands, shopping? 0    Patient Care Team: [name], Audrie Gallus, MD as PCP - General (Family Medicine) End, Cristal Deer, MD as PCP - Cardiology (Cardiology) Levert Feinstein, MD as Attending Physician (Neurology)  Indicate any recent Medical Services you may have received from other than [facility] providers in the past year (date may be approximate).     Assessment:   This is a routine wellness examination for Pattie.  Hearing/Vision screen No results found.   Goals Addressed   None    Depression Screen     06/07/2023   10:05 AM 08/15/2022    1:14 PM 06/08/2022    9:18 AM 05/16/2022    8:25 AM 07/25/2021    9:11 AM 04/10/2019    9:16 AM 03/21/2018    9:50 AM  PHQ 2/9 Scores  PHQ - 2 Score 4 2 0 0 0 0 5  PHQ- 9 Score 19 8    0 12    Fall Risk    06/07/2023   10:05 AM 08/15/2022    1:07 PM 06/08/2022    9:18 AM 05/16/2022    8:25 AM 07/25/2021    9:15 AM  Fall Risk   Falls in the past year? 0  0 0 1  Number falls in past yr: 0 0   0  Injury with Fall? 0 0   0  Risk for fall due to : No Fall Risks      Follow up Falls evaluation completed Education provided;Falls prevention discussed;Falls evaluation completed Falls evaluation completed Falls evaluation completed Falls evaluation completed    MEDICARE RISK AT HOME:    TIMED UP AND GO:  Was the test performed?  No    Cognitive Function:    04/10/2019    9:05 AM 03/21/2018    9:45 AM 10/21/2016    9:35 AM  MMSE - Mini Mental State Exam  Orientation to time 5 5 5   Orientation to Place 5 5 5   Registration 3 3 3   Attention/ Calculation 0 0 0  Recall 3 3 3   Language- name 2 objects 0 0 0  Language- repeat 1 1 1   Language- follow 3 step command 0 3 3  Language- read & follow direction 0 0 0  Write a sentence 0 0 0  Copy design 0 0 0  Total score 17 20 20         08/15/2022    1:09 PM 07/25/2021    9:17 AM  6CIT Screen  What Year? 0 points 0 points  What month? 0 points  0 points  What time? 0 points 0 points  Count back from 20 0 points 0 points  Months in reverse 0 points 0 points  Repeat phrase 0 points   Total Score 0 points     Immunizations Immunization History  Administered Date(s) Administered   Influenza,inj,Quad PF,6+ Mos 11/25/2015, 10/25/2016, 01/09/2018   PFIZER(Purple Top)SARS-COV-2 Vaccination 05/27/2020, 06/17/2020    TDAP status: Due, Education has been provided regarding the importance of this vaccine. Advised may receive this vaccine at local pharmacy or Health Dept. Aware to provide a copy of the  vaccination record if obtained from local pharmacy or Health Dept. Verbalized acceptance and understanding.  Flu Vaccine status: Declined, Education has been provided regarding the importance of this vaccine but patient still declined. Advised may receive this vaccine at local pharmacy or Health Dept. Aware to provide a copy of the vaccination record if obtained from local pharmacy or Health Dept. Verbalized acceptance and understanding.   Covid-19 vaccine status: Completed vaccines  Qualifies for Shingles Vaccine? Yes   Zostavax completed No   Shingrix Completed?: No.    Education has been provided regarding the importance of this vaccine. Patient has been advised to call insurance company to determine out of pocket expense if they have not yet received this vaccine. Advised may also receive vaccine at local pharmacy or Health Dept. Verbalized acceptance and understanding.  Screening Tests Health Maintenance  Topic Date Due   Hepatitis C Screening  Never done   DTaP/Tdap/Td (1 - Tdap) Never done   COVID-19 Vaccine (3 - Pfizer risk series) 07/15/2020   Medicare Annual Wellness (AWV)  08/16/2023   Zoster Vaccines- Shingrix (1 of 2) 09/07/2023 (Originally 09/18/1990)   INFLUENZA VACCINE  01/29/2024 (Originally 06/01/2023)   MAMMOGRAM  08/09/2024   Colonoscopy  02/10/2027   HIV Screening  Completed   HPV VACCINES  Aged Out    Health Maintenance  Health Maintenance Due  Topic Date Due   Hepatitis C Screening  Never done   DTaP/Tdap/Td (1 - Tdap) Never done   COVID-19 Vaccine (3 - Pfizer risk series) 07/15/2020   Medicare Annual Wellness (AWV)  08/16/2023    Colorectal cancer screening: Type of screening: Colonoscopy. Completed 02/10/20. Repeat every 5-10 years  Mammogram status: Completed 08/10/23. Repeat every year  Lung Cancer Screening: (Low Dose CT Chest recommended if Age 35-80 years, 20 pack-year currently smoking OR have quit w/in 15years.) does not qualify.   Lung Cancer  Screening Referral: no  Additional Screening:  Hepatitis C Screening: does not qualify; Completed no  Vision Screening: Recommended annual ophthalmology exams for early detection of glaucoma and other disorders of the eye. Is the patient up to date with their annual eye exam?  Yes  Who is the provider or what is the name of the office in which the patient attends annual eye exams? [facility] If pt is not established with a provider, would they like to be referred to a provider to establish care? No .   Dental Screening: Recommended annual dental exams for proper oral hygiene  Diabetic Foot Exam: n/a  Community Resource Referral / Chronic Care Management: CRR required this visit?  No   CCM required this visit?  No    Plan:     I have personally reviewed and noted the following in the patient's chart:   Medical and social history Use of alcohol, tobacco or illicit drugs  Current medications and supplements including opioid prescriptions. Patient is not currently taking opioid prescriptions.  Functional ability and status Nutritional status Physical activity Advanced directives List of other physicians Hospitalizations, surgeries, and ER visits in previous 12 months Vitals Screenings to include cognitive, depression, and falls Referrals and appointments  In addition, I have reviewed and discussed with patient certain preventive protocols, quality metrics, and best practice recommendations. A written personalized care plan for preventive services as well as general preventive health recommendations were provided to patient.     Sue Lush, LPN   [id number]   After Visit Summary: (MyChart) Due to this being a telephonic visit, the after visit summary with patients personalized plan was offered to patient via MyChart   Nurse Notes: Pt states she continues to manage back pain with persistent nausea (no vomiting). She has no questions at this time.

## 2023-08-22 NOTE — Telephone Encounter (Signed)
Records faxed per patients request.

## 2023-08-22 NOTE — Patient Instructions (Signed)
Dana Greer , Thank you for taking time to come for your Medicare Wellness Visit. I appreciate your ongoing commitment to your health goals. Please review the following plan we discussed and let me know if I can assist you in the future.   Referrals/Orders/Follow-Ups/Clinician Recommendations: none  This is a list of the screening recommended for you and due dates:  Health Maintenance  Topic Date Due   Hepatitis C Screening  Never done   DTaP/Tdap/Td vaccine (1 - Tdap) Never done   COVID-19 Vaccine (3 - Pfizer risk series) 07/15/2020   Zoster (Shingles) Vaccine (1 of 2) 09/07/2023*   Flu Shot  01/29/2024*   Mammogram  08/09/2024   Medicare Annual Wellness Visit  08/21/2024   Colon Cancer Screening  02/10/2027   HIV Screening  Completed   HPV Vaccine  Aged Out  *Topic was postponed. The date shown is not the original due date.    Advanced directives: (Declined) Advance directive discussed with you today. Even though you declined this today, please call our office should you change your mind, and we can give you the proper paperwork for you to fill out.  Next Medicare Annual Wellness Visit scheduled for next year: Yes 08/22/24 @ 3:05pm telephone

## 2023-08-24 NOTE — Progress Notes (Signed)
Subjective:   Dana Greer is a 52 y.o. female who presents for Medicare Annual (Subsequent) preventive examination.  Visit Complete: Virtual I connected with  Dana Greer on 08/24/23 by a audio enabled telemedicine application and verified that I am speaking with the correct person using two identifiers.  Patient Location: Home  Provider Location: Office/Clinic  I discussed the limitations of evaluation and management by telemedicine. The patient expressed understanding and agreed to proceed.  Vital Signs: Because this visit was a virtual/telehealth visit, some criteria may be missing or patient reported. Any vitals not documented were not able to be obtained and vitals that have been documented are patient reported.  Patient Medicare AWV questionnaire was completed by the patient on (not done); I have confirmed that all information answered by patient is correct and no changes since this date.  Cardiac Risk Factors include: advanced age (>65men, >43 women);hypertension;obesity (BMI >30kg/m2)    Objective:    Today's Vitals   08/22/23 1439  Weight: 194 lb (88 kg)  Height: 5\' 2"  (1.575 m)  PainSc: 8    Body mass index is 35.48 kg/m.     08/22/2023    2:50 PM 05/24/2023    8:48 PM 03/24/2023    5:14 PM 10/05/2022    2:23 PM 10/04/2022    9:51 PM 08/15/2022    1:07 PM 07/09/2022    8:58 AM  Advanced Directives  Does Patient Have a Medical Advance Directive? No No No No No No No  Would patient like information on creating a medical advance directive?   No - Patient declined No - Patient declined No - Patient declined No - Patient declined No - Patient declined    Current Medications (verified) Outpatient Encounter Medications as of 08/22/2023  Medication Sig   ALPRAZolam (XANAX) 1 MG tablet Take 1 mg by mouth 4 (four) times daily as needed for anxiety or sleep.    amitriptyline (ELAVIL) 25 MG tablet Take 2 tablets (50 mg total) by mouth daily.   amLODipine  (NORVASC) 10 MG tablet Take 1 tablet (10 mg total) by mouth daily.   butalbital-acetaminophen-caffeine (FIORICET) 50-325-40 MG tablet Take 1-2 tablets by mouth every 6 (six) hours as needed for headache.   cloNIDine (CATAPRES - DOSED IN MG/24 HR) 0.2 mg/24hr patch Place 1 patch (0.2 mg total) onto the skin once a week.   cloNIDine (CATAPRES) 0.1 MG tablet Take 1 tablet (0.1 mg total) by mouth 2 (two) times daily as needed (for pressure >170).   cyclobenzaprine (FLEXERIL) 10 MG tablet Take 1 tablet (10 mg total) by mouth at bedtime as needed for muscle spasms.   EPINEPHrine (EPIPEN 2-PAK) 0.3 mg/0.3 mL IJ SOAJ injection Inject 0.3 mg into the muscle as needed for anaphylaxis.   etonogestrel (NEXPLANON) 68 MG IMPL implant Inject 1 each into the skin continuous. Reported on 01/27/2016   famotidine (PEPCID) 20 MG tablet TAKE 1 TABLET BY MOUTH 2 TIMES DAILY   meclizine (ANTIVERT) 25 MG tablet Take 50 mg by mouth as needed for dizziness.   metoCLOPramide (REGLAN) 10 MG tablet TAKE ONE TABLET BY MOUTH EVERY 6 HOURS AS NEEDED FOR NAUSEA OR VOMITING (Patient taking differently: Take 10 mg by mouth every 6 (six) hours as needed for vomiting or nausea.)   ondansetron (ZOFRAN) 8 MG tablet Take 8 mg by mouth 3 (three) times daily.   pantoprazole (PROTONIX) 40 MG tablet Take 1 tablet (40 mg total) by mouth daily.   promethazine (PHENERGAN) 25 MG  suppository Place 1 suppository (25 mg total) rectally every 6 (six) hours as needed for nausea or vomiting.   valACYclovir (VALTREX) 500 MG tablet TAKE ONE TABLET (500 mg) BY MOUTH EVERY DAY   methylPREDNISolone (MEDROL DOSEPAK) 4 MG TBPK tablet Take per packet instructions (Patient not taking: Reported on 08/22/2023)   No facility-administered encounter medications on file as of 08/22/2023.    Allergies (verified) Trazodone and nefazodone, Amoxicillin, Trazodone, and Carafate [sucralfate]   History: Past Medical History:  Diagnosis Date   Arthritis    Depression     Fibromyalgia    Headache(784.0)    Hx of adenomatous polyp of colon 02/14/2020   Hypertension    Immune deficiency disorder (HCC)    Migraine headache    Shingles    Stroke (HCC)    Thrombocytopenia (HCC) 10/01/2012   Past Surgical History:  Procedure Laterality Date   ABDOMINAL HYSTERECTOMY     CARPAL TUNNEL RELEASE Left 09/25/2018   Procedure: LEFT CARPAL TUNNEL RELEASE;  Surgeon: Cindee Salt, MD;  Location: [facility];  Service: Orthopedics;  Laterality: Left;   CESAREAN SECTION  2008   x1   PARTIAL HYSTERECTOMY  2012   due to fibroid   UPPER GASTROINTESTINAL ENDOSCOPY     WISDOM TOOTH EXTRACTION  1995   Family History  Problem Relation Age of Onset   Healthy Mother    Diabetes Father    COPD Father    Breast cancer Paternal Aunt    Lung cancer Paternal Aunt    Lung cancer Paternal Uncle    Breast cancer Paternal Grandmother    Colon cancer Paternal Grandmother    Rectal cancer Paternal Grandmother    Esophageal cancer Cousin    Stomach cancer Neg Hx    Social History   Socioeconomic History   Marital status: Legally Separated    Spouse name: Not on file   Number of children: 3   Years of education: college   Highest education level: Some college, no degree  Occupational History   Occupation: Unemployed  Tobacco Use   Smoking status: Never   Smokeless tobacco: Never  Vaping Use   Vaping status: Never Used  Substance and Sexual Activity   Alcohol use: Yes    Comment: occasional   Drug use: Yes    Types: Marijuana    Comment: smokes small amount twice daily   Sexual activity: Not Currently    Birth control/protection: None  Other Topics Concern   Not on file  Social History Narrative   Lives at home with her children.   6 cups caffeine per day.   Right-handed.   Social Determinants of Health   Financial Resource Strain: Low Risk  (08/22/2023)   Overall Financial Resource Strain (CARDIA)    Difficulty of Paying Living Expenses:  Not hard at all  Food Insecurity: No Food Insecurity (08/22/2023)   Hunger Vital Sign    Worried About Running Out of Food in the Last Year: Never true    Ran Out of Food in the Last Year: Never true  Recent Concern: Food Insecurity - Food Insecurity Present (06/07/2023)   Hunger Vital Sign    Worried About Running Out of Food in the Last Year: Often true    Ran Out of Food in the Last Year: Sometimes true  Transportation Needs: No Transportation Needs (08/22/2023)   PRAPARE - Administrator, Civil Service (Medical): No    Lack of Transportation (Non-Medical): No  Physical Activity:  Insufficiently Active (08/22/2023)   Exercise Vital Sign    Days of Exercise per Week: 3 days    Minutes of Exercise per Session: 20 min  Stress: Stress Concern Present (08/22/2023)   [employer] of Occupational Health - Occupational Stress Questionnaire    Feeling of Stress : Rather much  Social Connections: Moderately Integrated (08/22/2023)   Social Connection and Isolation Panel [NHANES]    Frequency of Communication with Friends and Family: Three times a week    Frequency of Social Gatherings with Friends and Family: Twice a week    Attends Religious Services: More than 4 times per year    Active Member of [employer] or Organizations: Yes    Attends Engineer, structural: More than 4 times per year    Marital Status: Separated  Recent Concern: Social Connections - Moderately Isolated (06/07/2023)   Social Connection and Isolation Panel [NHANES]    Frequency of Communication with Friends and Family: Once a week    Frequency of Social Gatherings with Friends and Family: Once a week    Attends Religious Services: More than 4 times per year    Active Member of [employer] or Organizations: Yes    Attends Engineer, structural: More than 4 times per year    Marital Status: Separated    Tobacco Counseling Counseling given: Not Answered  Clinical Intake:  Pre-visit preparation  completed: No  Pain : 0-10 Pain Score: 8  Pain Type: Chronic pain Pain Location: Back Pain Orientation: Upper, Mid, Lower Pain Descriptors / Indicators: Aching Pain Onset: More than a month ago Pain Frequency: Constant Pain Relieving Factors: nothing but rest  Pain Relieving Factors: nothing but rest  BMI - recorded: 35.48 Nutritional Status: BMI > 30  Obese Nutritional Risks: Nausea/ vomitting/ diarrhea (nausea for 11-12 years (5days weekly)) Diabetes: No  How often do you need to have someone help you when you read instructions, pamphlets, or other written materials from your doctor or pharmacy?: 1 - Never  Interpreter Needed?: No  Comments: lives with family Information entered by :: B.Ajit Errico,LPN   Activities of Daily Living    08/22/2023    2:50 PM 10/06/2022    3:00 PM  In your present state of health, do you have any difficulty performing the following activities:  Hearing? 0 0  Vision? 0 0  Difficulty concentrating or making decisions?  1  Walking or climbing stairs? 1 0  Dressing or bathing? 0 0  Doing errands, shopping? 0 0  Preparing Food and eating ? N   Using the Toilet? N   In the past six months, have you accidently leaked urine? Y   Do you have problems with loss of bowel control? N   Managing your Medications? N   Managing your Finances? N   Housekeeping or managing your Housekeeping? N     Patient Care Team: [name], Audrie Gallus, MD as PCP - General (Family Medicine) End, Cristal Deer, MD as PCP - Cardiology (Cardiology) Levert Feinstein, MD as Attending Physician (Neurology)  Indicate any recent Medical Services you may have received from other than [facility] providers in the past year (date may be approximate).     Assessment:   This is a routine wellness examination for Shelda.  Hearing/Vision screen Hearing Screening - Comments:: Pt says she hears well Vision Screening - Comments:: Pt sts she wears glasses and sees well Copy   Goals  Addressed  This Visit's Progress    Increase physical activity   On track    Starting 04/10/2019, I will continue to exercise for 30 minutes daily.        Depression Screen    08/22/2023    2:47 PM 06/07/2023   10:05 AM 08/15/2022    1:14 PM 06/08/2022    9:18 AM 05/16/2022    8:25 AM 07/25/2021    9:11 AM 04/10/2019    9:16 AM  PHQ 2/9 Scores  PHQ - 2 Score 3 4 2  0 0 0 0  PHQ- 9 Score 9 19 8     0    Fall Risk    08/22/2023    2:42 PM 06/07/2023   10:05 AM 08/15/2022    1:07 PM 06/08/2022    9:18 AM 05/16/2022    8:25 AM  Fall Risk   Falls in the past year? 0 0  0 0  Number falls in past yr: 0 0 0    Injury with Fall? 0 0 0    Risk for fall due to : No Fall Risks No Fall Risks     Follow up Falls prevention discussed;Education provided Falls evaluation completed Education provided;Falls prevention discussed;Falls evaluation completed Falls evaluation completed Falls evaluation completed    MEDICARE RISK AT HOME: Medicare Risk at Home Any stairs in or around the home?: Yes If so, are there any without handrails?: Yes Home free of loose throw rugs in walkways, pet beds, electrical cords, etc?: Yes Adequate lighting in your home to reduce risk of falls?: Yes Life alert?: No Use of a cane, walker or w/c?: Yes Grab bars in the bathroom?: Yes Shower chair or bench in shower?: Yes Elevated toilet seat or a handicapped toilet?: Yes  TIMED UP AND GO:  Was the test performed?  No    Cognitive Function:    04/10/2019    9:05 AM 03/21/2018    9:45 AM 10/21/2016    9:35 AM  MMSE - Mini Mental State Exam  Orientation to time 5 5 5   Orientation to Place 5 5 5   Registration 3 3 3   Attention/ Calculation 0 0 0  Recall 3 3 3   Language- name 2 objects 0 0 0  Language- repeat 1 1 1   Language- follow 3 step command 0 3 3  Language- read & follow direction 0 0 0  Write a sentence 0 0 0  Copy design 0 0 0  Total score 17 20 20         08/22/2023    2:53 PM  08/15/2022    1:09 PM 07/25/2021    9:17 AM  6CIT Screen  What Year? 0 points 0 points 0 points  What month? 0 points 0 points 0 points  What time? 0 points 0 points 0 points  Count back from 20 0 points 0 points 0 points  Months in reverse 0 points 0 points 0 points  Repeat phrase 0 points 0 points   Total Score 0 points 0 points     Immunizations Immunization History  Administered Date(s) Administered   Influenza,inj,Quad PF,6+ Mos 11/25/2015, 10/25/2016, 01/09/2018   PFIZER(Purple Top)SARS-COV-2 Vaccination 05/27/2020, 06/17/2020    TDAP status: Due, Education has been provided regarding the importance of this vaccine. Advised may receive this vaccine at local pharmacy or Health Dept. Aware to provide a copy of the vaccination record if obtained from local pharmacy or Health Dept. Verbalized acceptance and understanding.  Flu Vaccine status: Declined, Education has  been provided regarding the importance of this vaccine but patient still declined. Advised may receive this vaccine at local pharmacy or Health Dept. Aware to provide a copy of the vaccination record if obtained from local pharmacy or Health Dept. Verbalized acceptance and understanding.   Covid-19 vaccine status: Completed vaccines  Qualifies for Shingles Vaccine? Yes   Zostavax completed No   Shingrix Completed?: No.    Education has been provided regarding the importance of this vaccine. Patient has been advised to call insurance company to determine out of pocket expense if they have not yet received this vaccine. Advised may also receive vaccine at local pharmacy or Health Dept. Verbalized acceptance and understanding.  Screening Tests Health Maintenance  Topic Date Due   Hepatitis C Screening  Never done   COVID-19 Vaccine (3 - Pfizer risk series) 09/07/2023 (Originally 07/15/2020)   Zoster Vaccines- Shingrix (1 of 2) 09/07/2023 (Originally [birth year]18/1989)   INFLUENZA VACCINE  01/29/2024 (Originally 06/01/2023)    DTaP/Tdap/Td (1 - Tdap) 08/21/2024 (Originally 09/18/1989)   MAMMOGRAM  08/09/2024   Medicare Annual Wellness (AWV)  08/21/2024   Colonoscopy  02/10/2027   HIV Screening  Completed   HPV VACCINES  Aged Out    Health Maintenance  Health Maintenance Due  Topic Date Due   Hepatitis C Screening  Never done    Colorectal cancer screening: Type of screening: Colonoscopy. Completed 02/10/20. Repeat every 5-10 years  Mammogram status: Completed 08/10/23. Repeat every year  Lung Cancer Screening: (Low Dose CT Chest recommended if Age 78-80 years, 20 pack-year currently smoking OR have quit w/in 15years.) does not qualify.   Lung Cancer Screening Referral: no  Additional Screening:  Hepatitis C Screening: does not qualify; Completed no  Vision Screening: Recommended annual ophthalmology exams for early detection of glaucoma and other disorders of the eye. Is the patient up to date with their annual eye exam?  Yes  Who is the provider or what is the name of the office in which the patient attends annual eye exams? [facility] If pt is not established with a provider, would they like to be referred to a provider to establish care? No .   Dental Screening: Recommended annual dental exams for proper oral hygiene  Diabetic Foot Exam: n/a  Community Resource Referral / Chronic Care Management: CRR required this visit?  No   CCM required this visit?  No    Plan:     I have personally reviewed and noted the following in the patient's chart:   Medical and social history Use of alcohol, tobacco or illicit drugs  Current medications and supplements including opioid prescriptions. Patient is not currently taking opioid prescriptions. Functional ability and status Nutritional status Physical activity Advanced directives List of other physicians Hospitalizations, surgeries, and ER visits in previous 12 months Vitals Screenings to include cognitive, depression, and falls Referrals and  appointments  In addition, I have reviewed and discussed with patient certain preventive protocols, quality metrics, and best practice recommendations. A written personalized care plan for preventive services as well as general preventive health recommendations were provided to patient.     Sue Lush, LPN   [id number]   After Visit Summary: (MyChart) Due to this being a telephonic visit, the after visit summary with patients personalized plan was offered to patient via MyChart   Nurse Notes: Pt states she continues to manage back pain with persistent nausea (no vomiting). She has no questions at this time.

## 2023-08-29 ENCOUNTER — Encounter: Payer: Self-pay | Admitting: Orthopedic Surgery

## 2023-09-22 ENCOUNTER — Encounter: Payer: Self-pay | Admitting: Family Medicine

## 2023-09-22 NOTE — Telephone Encounter (Signed)
Pt hasn't been seen since Sept, please schedule an appt with 1st available provider to have sxs eval

## 2023-09-25 ENCOUNTER — Encounter: Payer: Self-pay | Admitting: Family Medicine

## 2023-09-25 ENCOUNTER — Ambulatory Visit (INDEPENDENT_AMBULATORY_CARE_PROVIDER_SITE_OTHER): Payer: 59 | Admitting: Family Medicine

## 2023-09-25 VITALS — BP 120/76 | HR 94 | Temp 98.6°F | Ht 62.0 in

## 2023-09-25 DIAGNOSIS — I10 Essential (primary) hypertension: Secondary | ICD-10-CM | POA: Diagnosis not present

## 2023-09-25 DIAGNOSIS — R1115 Cyclical vomiting syndrome unrelated to migraine: Secondary | ICD-10-CM

## 2023-09-25 DIAGNOSIS — F4323 Adjustment disorder with mixed anxiety and depressed mood: Secondary | ICD-10-CM

## 2023-09-25 MED ORDER — PANTOPRAZOLE SODIUM 40 MG PO TBEC: 40.0000 mg | DELAYED_RELEASE_TABLET | Freq: Every day | ORAL | 3 refills | Status: DC

## 2023-09-25 MED ORDER — FAMOTIDINE 20 MG PO TABS: 20.0000 mg | ORAL_TABLET | Freq: Two times a day (BID) | ORAL | 3 refills | Status: AC

## 2023-09-25 NOTE — Progress Notes (Signed)
Subjective:    Patient ID: Dana Greer, female    DOB: 12-29-1970, 52 y.o.   MRN: [id number]  HPI  Wt Readings from Last 3 Encounters:  08/22/23 194 lb (88 kg)  07/07/23 194 lb 8 oz (88.2 kg)  06/07/23 189 lb 2 oz (85.8 kg)   35.48 kg/m  Vitals:   09/25/23 1212  BP: 120/76  Pulse: 94  Temp: 98.6 F (37 C)  SpO2: 100%    Pt presents for episodes of  Vomiting Sweating Weakness   Headache   Pain = feels like severe hunger pain  Eats a little something  Then gets sweating and then has to vomit   Episodes are more frequent now  Eating pho  Tried boiled chicken and rice  Eating bland    Blood pressure has been up and down    Phenergan  Reglan -does not want to use it regularly  (did have episode of shaky legs when she took a shower)  Zofran -uses for nausea  Ginger helps also   Ppi Protonix  Pepcid   She did go to endocrinology recently for epigastric pain / diarrhea / prediabetes Did not think she had endo cause of her symptoms   Did screen for celiac dz   In past had normal EGD in 2021 and colonoscopy   Did have CS and then hysterectomy for fibroid  ? Endometriosis  Wonders if a build up of scar tissue could cause trouble   Neg HIDA scan in the past  CT normal in past    Stress is definitely a trigger  Worse lately    Was seeing a counselor in the past   Has to leave the house with her mother and son  Very tough   Loves to meditate  Loves to garden   Has gyn visit on dec 14th       Lab Results  Component Value Date   ALT 13 03/09/2023   AST 20 03/09/2023   ALKPHOS 49 03/09/2023   BILITOT 0.7 03/09/2023   Lab Results  Component Value Date   NA 140 05/24/2023   K 4.1 05/24/2023   CO2 27 05/24/2023   GLUCOSE 99 05/24/2023   BUN 15 05/24/2023   CREATININE 1.15 (H) 05/24/2023   CALCIUM 9.5 05/24/2023   GFR 75.72 11/03/2022   GFRNONAA 57 (L) 05/24/2023   Lab Results  Component Value Date   WBC 9.0 05/24/2023    HGB 12.9 05/24/2023   HCT 38.4 05/24/2023   MCV 99.7 05/24/2023   PLT 157 05/24/2023   Lab Results  Component Value Date   HGBA1C 5.7 06/07/2023       BP Readings from Last 3 Encounters:  09/25/23 120/76  07/07/23 114/80  06/07/23 124/72   Pulse Readings from Last 3 Encounters:  09/25/23 94  07/07/23 (!) 50  06/07/23 (!) 55   Amlodipine 10 mg daily  Clonidine patch 0.2 mg  Oral clonidine 0.1 bid prn     Patient Active Problem List   Diagnosis Date Noted   Pre-syncope 07/27/2023   Chronic night sweats 07/27/2023   Skin pain 01/08/2023   Ovarian cyst 11/05/2022   Elevated troponin 10/05/2022   Thickening of wall of gallbladder 10/05/2022   Anxiety 10/05/2022   Scoliosis 09/07/2022   Microscopic hematuria 07/05/2022   Heat exhaustion, unspecified, sequela 05/05/2022   Generalized weakness 05/05/2022   Obesity (BMI 30-39.9)    Rash and nonspecific skin eruption 11/15/2021   Perioral dermatitis 11/02/2021  Mixed incontinence 08/16/2021   Elevated serum creatinine 08/12/2021   Urinary frequency 08/12/2021   Herpes zoster without complication 05/04/2021   Hand pain, left 10/14/2020   Swelling of hand joint, left 10/14/2020   TMJ (temporomandibular joint syndrome) 08/18/2020   Non-intractable cyclical vomiting 05/15/2020   Left-sided back pain 02/25/2020   Hx of adenomatous polyp of colon 02/14/2020   Seasonal allergic rhinitis 01/21/2020   Chronic cough 10/04/2019   Headache, post-traumatic, acute 01/28/2019   Head injury, closed 01/17/2019   Abnormal vaginal bleeding 07/06/2018   Neck pain 06/25/2018   Migraine, chronic, without aura 04/12/2018   B12 deficiency 01/10/2018   Foot pain, bilateral 01/09/2018   Hand paresthesia 01/09/2018   Vitamin D deficiency 01/09/2018   Upper abdominal pain 11/22/2016   Chronic myofascial pain 10/26/2016   Encounter for screening mammogram for breast cancer 10/25/2016   HSV-2 infection 08/17/2016   Adverse effects  of medication 11/25/2015   Vertigo 10/08/2015   Carpal tunnel syndrome 03/16/2015   Chronic nausea 02/17/2015   Gout 12/22/2014   Vertiginous migraine 02/03/2014   Intractable migraine 08/31/2012   Osteoarthritis 05/11/2012   Female bladder prolapse 05/11/2012   ADJ DISORDER WITH MIXED ANXIETY & DEPRESSED MOOD 06/30/2008   HYPERHIDROSIS 01/25/2008   BACK PAIN, LUMBAR, CHRONIC 10/16/2007   REACTION, ACUTE STRESS W/EMOTIONAL DSTURB 08/01/2007   Prediabetes 08/01/2007   Hypertension 08/01/2007   Goiter 07/11/2007   POLYCYSTIC OVARIAN DISEASE 05/24/2007   HIRSUTISM 05/24/2007   Arthropathy, multiple sites 05/24/2007   Past Medical History:  Diagnosis Date   Arthritis    Depression    Fibromyalgia    Headache(784.0)    Hx of adenomatous polyp of colon 02/14/2020   Hypertension    Immune deficiency disorder (HCC)    Migraine headache    Shingles    Stroke (HCC)    Thrombocytopenia (HCC) 10/01/2012   Past Surgical History:  Procedure Laterality Date   ABDOMINAL HYSTERECTOMY     CARPAL TUNNEL RELEASE Left 09/25/2018   Procedure: LEFT CARPAL TUNNEL RELEASE;  Surgeon: Cindee Salt, MD;  Location: [facility];  Service: Orthopedics;  Laterality: Left;   CESAREAN SECTION  2008   x1   PARTIAL HYSTERECTOMY  2012   due to fibroid   UPPER GASTROINTESTINAL ENDOSCOPY     WISDOM TOOTH EXTRACTION  1995   Social History   Tobacco Use   Smoking status: Never   Smokeless tobacco: Never  Vaping Use   Vaping status: Never Used  Substance Use Topics   Alcohol use: Yes    Comment: occasional   Drug use: Yes    Types: Marijuana    Comment: smokes small amount twice daily   Family History  Problem Relation Age of Onset   Healthy Mother    Diabetes Father    COPD Father    Breast cancer Paternal Aunt    Lung cancer Paternal Aunt    Lung cancer Paternal Uncle    Breast cancer Paternal Grandmother    Colon cancer Paternal Grandmother    Rectal cancer Paternal  Grandmother    Esophageal cancer Cousin    Stomach cancer Neg Hx    Allergies  Allergen Reactions   Trazodone And Nefazodone Nausea And Vomiting   Amoxicillin Other (See Comments)    Yeast infection    Trazodone Nausea And Vomiting   Carafate [Sucralfate] Nausea And Vomiting   Current Outpatient Medications on File Prior to Visit  Medication Sig Dispense Refill   ALPRAZolam Prudy Feeler)  1 MG tablet Take 1 mg by mouth 4 (four) times daily as needed for anxiety or sleep.      amitriptyline (ELAVIL) 25 MG tablet Take 2 tablets (50 mg total) by mouth daily. 60 tablet 0   amLODipine (NORVASC) 10 MG tablet Take 1 tablet (10 mg total) by mouth daily. 90 tablet 3   butalbital-acetaminophen-caffeine (FIORICET) 50-325-40 MG tablet Take 1-2 tablets by mouth every 6 (six) hours as needed for headache. 20 tablet 0   cloNIDine (CATAPRES - DOSED IN MG/24 HR) 0.2 mg/24hr patch Place 1 patch (0.2 mg total) onto the skin once a week. 12 patch 1   cloNIDine (CATAPRES) 0.1 MG tablet Take 1 tablet (0.1 mg total) by mouth 2 (two) times daily as needed (for pressure >170). 60 tablet 4   cyclobenzaprine (FLEXERIL) 10 MG tablet Take 1 tablet (10 mg total) by mouth at bedtime as needed for muscle spasms. 30 tablet 3   EPINEPHrine (EPIPEN 2-PAK) 0.3 mg/0.3 mL IJ SOAJ injection Inject 0.3 mg into the muscle as needed for anaphylaxis. 2 each 1   etonogestrel (NEXPLANON) 68 MG IMPL implant Inject 1 each into the skin continuous. Reported on 01/27/2016     meclizine (ANTIVERT) 25 MG tablet Take 50 mg by mouth as needed for dizziness.     metoCLOPramide (REGLAN) 10 MG tablet TAKE ONE TABLET BY MOUTH EVERY 6 HOURS AS NEEDED FOR NAUSEA OR VOMITING (Patient taking differently: Take 10 mg by mouth every 6 (six) hours as needed for vomiting or nausea.) 60 tablet 0   ondansetron (ZOFRAN) 8 MG tablet Take 8 mg by mouth 3 (three) times daily.     promethazine (PHENERGAN) 25 MG suppository Place 1 suppository (25 mg total) rectally  every 6 (six) hours as needed for nausea or vomiting. 20 each 0   valACYclovir (VALTREX) 500 MG tablet TAKE ONE TABLET (500 mg) BY MOUTH EVERY DAY 30 tablet 0   No current facility-administered medications on file prior to visit.    Review of Systems  Constitutional:  Positive for fatigue. Negative for activity change, appetite change, fever and unexpected weight change.  HENT:  Negative for congestion, ear pain, rhinorrhea, sinus pressure and sore throat.   Eyes:  Negative for pain, redness and visual disturbance.  Respiratory:  Negative for cough, shortness of breath and wheezing.   Cardiovascular:  Negative for chest pain and palpitations.  Gastrointestinal:  Positive for nausea and vomiting. Negative for abdominal pain, blood in stool, constipation, diarrhea and rectal pain.  Endocrine: Negative for polydipsia and polyuria.  Genitourinary:  Negative for dysuria, frequency and urgency.  Musculoskeletal:  Negative for arthralgias, back pain and myalgias.  Skin:  Negative for pallor and rash.  Allergic/Immunologic: Negative for environmental allergies.  Neurological:  Negative for dizziness, syncope and headaches.  Hematological:  Negative for adenopathy. Does not bruise/bleed easily.  Psychiatric/Behavioral:  Positive for dysphoric mood and sleep disturbance. Negative for decreased concentration. The patient is nervous/anxious.        Objective:   Physical Exam Constitutional:      General: She is not in acute distress.    Appearance: Normal appearance. She is well-developed. She is obese. She is not ill-appearing or diaphoretic.  HENT:     Head: Normocephalic and atraumatic.  Eyes:     Conjunctiva/sclera: Conjunctivae normal.     Pupils: Pupils are equal, round, and reactive to light.  Neck:     Thyroid: No thyromegaly.     Vascular: No carotid bruit or  JVD.  Cardiovascular:     Rate and Rhythm: Normal rate and regular rhythm.     Heart sounds: Normal heart sounds.     No  gallop.  Pulmonary:     Effort: Pulmonary effort is normal. No respiratory distress.     Breath sounds: Normal breath sounds.  Abdominal:     General: There is no distension or abdominal bruit.     Palpations: Abdomen is soft. There is no mass.     Tenderness: There is no abdominal tenderness. There is no guarding or rebound.  Musculoskeletal:     Cervical back: Normal range of motion and neck supple.     Right lower leg: No edema.     Left lower leg: No edema.  Lymphadenopathy:     Cervical: No cervical adenopathy.  Skin:    General: Skin is warm and dry.     Coloration: Skin is not pale.     Findings: No rash.  Neurological:     Mental Status: She is alert.     Coordination: Coordination normal.     Deep Tendon Reflexes: Reflexes are normal and symmetric. Reflexes normal.  Psychiatric:        Attention and Perception: Attention normal.        Mood and Affect: Mood is anxious and depressed. Affect is tearful.        Behavior: Behavior normal.        Cognition and Memory: Cognition and memory normal.     Comments: Tearful at times Candidly discusses symptoms and stressors   Good insight            Assessment & Plan:   Problem List Items Addressed This Visit       Cardiovascular and Mediastinum   Hypertension    bp in fair control at this time  BP Readings from Last 1 Encounters:  09/25/23 120/76  This does go up when she has vomiting episodes  No changes needed Most recent labs reviewed  Disc lifstyle change with low sodium diet and exercise   Doing well with  Clonidine patch 0.2 mg  Oral clonidine 0.1 mg bid prn (or in between patches if unable to get) Amlodipine 10 mg daily          Digestive   Non-intractable cyclical vomiting - Primary (Chronic)    S/p GI and endo visits /work ups Normal EGD and colonoscopy in 2021 Has had HIDA scan and CTs  ? If adhesions from gyn surgery-has gyn follow up to discuss  These episodes do tend to time with  stressful periods in life  Her counselor retired-will refer for new Continues psychatric care as well   Reassuring exam today  Did refill protonix and pepcid as well  Has reglan prescription for prn use (sparingly- with side effects discussed)          Other   ADJ DISORDER WITH MIXED ANXIETY & DEPRESSED MOOD    Continues psychiatric care  Her counselor Optician, dispensing) retired  Will refer for new  Stressors are worse  Issues with her son (46 yo a conflict) Also lives with mother Overall really needs to move out and plans to work on that   [employer] and amitriptyline   Good insight Reviewed stressors/ coping techniques/symptoms/ support sources/ tx options and side effects today)

## 2023-09-25 NOTE — Patient Instructions (Addendum)
Use the generic reglan only if really needed   Get a feel for what you tolerate food wise Ginger is good  Try and stay hydrated  Practice self care   Follow up with gyn as planned   I put a referral in for counseling  If you don't get a call in 1-2 weeks (or mychart message) to set that up let us know

## 2023-09-25 NOTE — Assessment & Plan Note (Signed)
bp in fair control at this time  BP Readings from Last 1 Encounters:  09/25/23 120/76  This does go up when she has vomiting episodes  No changes needed Most recent labs reviewed  Disc lifstyle change with low sodium diet and exercise   Doing well with  Clonidine patch 0.2 mg  Oral clonidine 0.1 mg bid prn (or in between patches if unable to get) Amlodipine 10 mg daily

## 2023-09-25 NOTE — Assessment & Plan Note (Signed)
S/p GI and endo visits /work ups Normal EGD and colonoscopy in 2021 Has had HIDA scan and CTs  ? If adhesions from gyn surgery-has gyn follow up to discuss  These episodes do tend to time with stressful periods in life  Her counselor retired-will refer for new Continues psychatric care as well   Reassuring exam today  Did refill protonix and pepcid as well  Has reglan prescription for prn use (sparingly- with side effects discussed)

## 2023-09-25 NOTE — Assessment & Plan Note (Signed)
Continues psychiatric care  Her counselor Optician, dispensing) retired  Will refer for new  Stressors are worse  Issues with her son (52 yo a conflict) Also lives with mother Overall really needs to move out and plans to work on that   [employer] and amitriptyline   Good insight Reviewed stressors/ coping techniques/symptoms/ support sources/ tx options and side effects today)

## 2023-09-25 NOTE — Telephone Encounter (Signed)
Patient has been scheduled

## 2023-10-13 ENCOUNTER — Encounter: Payer: Self-pay | Admitting: Family Medicine

## 2023-11-05 ENCOUNTER — Encounter: Payer: Self-pay | Admitting: Family Medicine

## 2023-11-06 NOTE — Telephone Encounter (Signed)
 Let her know that we may never be sure if the herpes/shingles virus is causing this without a rash, but if a course of valtrex helps that is ok  Does she need some sent in?

## 2023-11-07 MED ORDER — VALACYCLOVIR HCL 500 MG PO TABS: 500.0000 mg | ORAL_TABLET | Freq: Two times a day (BID) | ORAL | 3 refills | Status: DC

## 2023-11-07 NOTE — Telephone Encounter (Signed)
 Called pt and she would like a RX of valtrex sent in to [facility] on [address]. [address].

## 2023-11-15 ENCOUNTER — Encounter: Payer: Self-pay | Admitting: Family Medicine

## 2023-11-19 NOTE — Telephone Encounter (Signed)
Please send her obgyn Last 2 years of notes Last year of labs  Last colonoscopy and EGD note Last GI note  Last endocrinology note  Last CT abd/pelvis  Last HIDA test  If we have all of this  Thanks

## 2023-11-25 ENCOUNTER — Other Ambulatory Visit: Payer: Self-pay | Admitting: Family Medicine

## 2023-12-13 ENCOUNTER — Encounter: Payer: Self-pay | Admitting: Family Medicine

## 2023-12-18 NOTE — Telephone Encounter (Signed)
Please send CT renal study from this fall  Last 3 prog notes Last year of labs To gyn [facility] And let pt kno w  Thanks

## 2023-12-20 NOTE — Telephone Encounter (Signed)
There is not an active referral to send these records from.   These records may need to be printed and faxed by the CMA to the requested office.

## 2023-12-20 NOTE — Telephone Encounter (Signed)
Please do if/when possible , thanks and let pt know

## 2023-12-26 ENCOUNTER — Encounter: Payer: Self-pay | Admitting: Family Medicine

## 2023-12-28 NOTE — Telephone Encounter (Signed)
 See other mychart, pt stated she doesn't need me to handle anymore

## 2024-01-01 NOTE — Telephone Encounter (Signed)
 sent

## 2024-01-02 ENCOUNTER — Encounter: Payer: Self-pay | Admitting: Family Medicine

## 2024-01-02 DIAGNOSIS — R1010 Upper abdominal pain, unspecified: Secondary | ICD-10-CM

## 2024-01-02 DIAGNOSIS — R1115 Cyclical vomiting syndrome unrelated to migraine: Secondary | ICD-10-CM

## 2024-01-02 NOTE — Telephone Encounter (Signed)
 I spoke with pt; since 2:15 pm today has vomited 5 - 6 times. Pt has taken famotidine, pantoprazole and Reglan . Pt threw the reglan right back up. Pt tried sip of gingerale and it came back up. Pt has severe hunger like pain in front of the lower abd. Pt said Dr Milinda Antis knows everything I am asking and she is not comfortable at the ED. She said the ED does the same things each time and does not help and does not discover what is causing this. Pt wants someone to get to the bottom of why this happens. Pt does not want her children to see her this way. Pt would like to see GI but does not want to go to same GI doctor she has seen in the past. Pt has taken zofran and it did not help. I think pt has also taken another med for nausea but pt did not feel like answering all "those questions" and wants cb after Dr Milinda Antis has seen this note and can offer what pt should do. Sending note to Dr Milinda Antis and [employer] and will speak with [place] CMA.

## 2024-01-02 NOTE — Telephone Encounter (Signed)
 The thing ER can do that UC cannot (and we cannot) is IVF if dehydrated -so watch for signs and symptoms of that  Zofran is the best we can do   I would like to refer her to a different GI practice to get re eval  Does she have a preference re: area or prefer [facility]?   So sorry she is struggling

## 2024-01-08 NOTE — Addendum Note (Signed)
 Addended by: Roxy Manns A on: 01/08/2024 07:59 PM   Modules accepted: Orders

## 2024-01-16 ENCOUNTER — Encounter (HOSPITAL_BASED_OUTPATIENT_CLINIC_OR_DEPARTMENT_OTHER): Payer: Self-pay | Admitting: Emergency Medicine

## 2024-01-16 ENCOUNTER — Ambulatory Visit: Payer: Self-pay | Admitting: Family Medicine

## 2024-01-16 ENCOUNTER — Emergency Department (HOSPITAL_BASED_OUTPATIENT_CLINIC_OR_DEPARTMENT_OTHER)

## 2024-01-16 ENCOUNTER — Encounter: Payer: Self-pay | Admitting: Family Medicine

## 2024-01-16 ENCOUNTER — Emergency Department (HOSPITAL_BASED_OUTPATIENT_CLINIC_OR_DEPARTMENT_OTHER)
Admission: EM | Admit: 2024-01-16 | Discharge: 2024-01-16 | Disposition: A | Discharge status: Home / Self Care | Attending: Emergency Medicine | Admitting: Emergency Medicine

## 2024-01-16 ENCOUNTER — Other Ambulatory Visit: Payer: Self-pay

## 2024-01-16 DIAGNOSIS — Z79899 Other long term (current) drug therapy: Secondary | ICD-10-CM | POA: Diagnosis not present

## 2024-01-16 DIAGNOSIS — R519 Headache, unspecified: Secondary | ICD-10-CM | POA: Diagnosis not present

## 2024-01-16 DIAGNOSIS — I10 Essential (primary) hypertension: Secondary | ICD-10-CM | POA: Diagnosis not present

## 2024-01-16 DIAGNOSIS — R1115 Cyclical vomiting syndrome unrelated to migraine: Secondary | ICD-10-CM | POA: Insufficient documentation

## 2024-01-16 LAB — COMPREHENSIVE METABOLIC PANEL
ALT: 9 U/L (ref 0–44)
AST: 17 U/L (ref 15–41)
Albumin: 4.9 g/dL (ref 3.5–5.0)
Alkaline Phosphatase: 54 U/L (ref 38–126)
Anion gap: 10 (ref 5–15)
BUN: 9 mg/dL (ref 6–20)
CO2: 26 mmol/L (ref 22–32)
Calcium: 9.9 mg/dL (ref 8.9–10.3)
Chloride: 100 mmol/L (ref 98–111)
Creatinine, Ser: 0.83 mg/dL (ref 0.44–1.00)
GFR, Estimated: >60 mL/min (ref >60)
Glucose, Bld: 138 mg/dL — ABNORMAL HIGH (ref 70–99)
Potassium: 3.3 mmol/L — ABNORMAL LOW (ref 3.5–5.1)
Sodium: 136 mmol/L (ref 135–145)
Total Bilirubin: 0.7 mg/dL (ref 0.0–1.2)
Total Protein: 8.1 g/dL (ref 6.5–8.1)

## 2024-01-16 LAB — CBC
HCT: 41.5 % (ref 36.0–46.0)
Hemoglobin: 14.2 g/dL (ref 12.0–15.0)
MCH: 33.3 pg (ref 26.0–34.0)
MCHC: 34.2 g/dL (ref 30.0–36.0)
MCV: 97.2 fL (ref 80.0–100.0)
Platelets: 125 10*3/uL — ABNORMAL LOW (ref 150–400)
RBC: 4.27 MIL/uL (ref 3.87–5.11)
RDW: 14.2 % (ref 11.5–15.5)
WBC: 11 10*3/uL — ABNORMAL HIGH (ref 4.0–10.5)
nRBC: 0 % (ref 0.0–0.2)

## 2024-01-16 LAB — URINALYSIS, ROUTINE W REFLEX MICROSCOPIC
Bacteria, UA: NONE SEEN
Bilirubin Urine: NEGATIVE
Glucose, UA: 100 mg/dL — AB
Leukocytes,Ua: NEGATIVE
Nitrite: NEGATIVE
Specific Gravity, Urine: 1.011 (ref 1.005–1.030)
pH: 8 (ref 5.0–8.0)

## 2024-01-16 LAB — LIPASE, BLOOD: Lipase: 21 U/L (ref 11–51)

## 2024-01-16 MED ORDER — ONDANSETRON HCL 4 MG/2ML IJ SOLN
4.0000 mg | Freq: Once | INTRAMUSCULAR | Status: AC
  Administered 2024-01-16: 4 mg via INTRAVENOUS
  Filled 2024-01-16: qty 2

## 2024-01-16 MED ORDER — LORAZEPAM 2 MG/ML IJ SOLN
1.0000 mg | Freq: Once | INTRAMUSCULAR | Status: AC
  Administered 2024-01-16: 1 mg via INTRAVENOUS
  Filled 2024-01-16: qty 1

## 2024-01-16 MED ORDER — METOCLOPRAMIDE HCL 5 MG/ML IJ SOLN
10.0000 mg | Freq: Once | INTRAMUSCULAR | Status: AC
  Administered 2024-01-16: 10 mg via INTRAVENOUS
  Filled 2024-01-16: qty 2

## 2024-01-16 MED ORDER — ONDANSETRON HCL 4 MG/2ML IJ SOLN
4.0000 mg | Freq: Once | INTRAMUSCULAR | Status: AC
Start: 2024-01-16 — End: 2024-01-16
  Administered 2024-01-16: 4 mg via INTRAVENOUS
  Filled 2024-01-16: qty 2

## 2024-01-16 MED ORDER — HYDROMORPHONE HCL 1 MG/ML IJ SOLN
1.0000 mg | Freq: Once | INTRAMUSCULAR | Status: AC
  Administered 2024-01-16: 1 mg via INTRAVENOUS
  Filled 2024-01-16: qty 1

## 2024-01-16 MED ORDER — SODIUM CHLORIDE 0.9 % IV BOLUS
1000.0000 mL | Freq: Once | INTRAVENOUS | Status: AC
  Administered 2024-01-16: 1000 mL via INTRAVENOUS

## 2024-01-16 NOTE — ED Notes (Signed)
 Ladona Ridgel, PA made aware of patient's inability to remain still/calm for vitals and the elevated BP.

## 2024-01-16 NOTE — ED Triage Notes (Signed)
 C/o non stop vomiting since around 0300 this morning. Patient arrived covered in vomit.

## 2024-01-16 NOTE — ED Notes (Signed)
 Patient called out about continued nausea. Provider notified.

## 2024-01-16 NOTE — ED Notes (Signed)
 Patient stood and

## 2024-01-16 NOTE — Telephone Encounter (Signed)
In ER now

## 2024-01-16 NOTE — Telephone Encounter (Signed)
  Chief Complaint: vomiting Symptoms: nausea, headache, vomiting Frequency: started at 3:30 am Pertinent Negatives: Patient denies CP, SOB Disposition: [x] ED /[] Urgent Care (no appt availability in office) / [] Appointment(In office/virtual)/ []  [facility]/ [] Home Care/ [] Refused Recommended Disposition /[]  Mobile Bus/ []  Follow-up with PCP Additional Notes: patient and patient's mother on the phone stating the patient has been vomiting since 3:30 am this morning. Prior to calling Nurse Triage, EMS was at the house to evaluate patient. Mother states patient had started to feel better and she decided she didn't need to go anywhere. While on the phone with Nurse Triage, patient left the room due to vomiting and dry heaving. Vomiting was audible while on the phone. Mother states patient has vomited at least 8 times since 3:30 am. Per protocol, the recommendation for patient is for Emergency Department evaluation. Patient's mother verbalized understanding of the plan and all questions answered.    Copied from CRM [id number]. Topic: Clinical - Red Word Triage >> Jan 16, 2024  8:33 AM Gurney Maxin H wrote: [facility] that prompted transfer to Nurse Triage: Vomiting, nausea, headache, blood pressure up 160/98 Reason for Disposition  [1] SEVERE vomiting (e.g., 6 or more times/day) AND [2] present > 8 hours (Exception: Patient sounds well, is drinking liquids, does not sound dehydrated, and vomiting has lasted less than 24 hours.)  Answer Assessment - Initial Assessment Questions 1. VOMITING SEVERITY: "How many times have you vomited in the past 24 hours?"     - MILD:  1 - 2 times/day    - MODERATE: 3 - 5 times/day, decreased oral intake without significant weight loss or symptoms of dehydration    - SEVERE: 6 or more times/day, vomits everything or nearly everything, with significant weight loss, symptoms of dehydration      Started around 3:30 am-8 times 2. ONSET: "When did the vomiting  begin?"      3:30 am this morning 3. FLUIDS: "What fluids or food have you vomited up today?" "Have you been able to keep any fluids down?"     Vomited up fluids-not keeping fluids or food down 4. ABDOMEN PAIN: "Are your having any abdomen pain?" If Yes : "How bad is it and what does it feel like?" (e.g., crampy, dull, intermittent, constant)      No 5. DIARRHEA: "Is there any diarrhea?" If Yes, ask: "How many times today?"      no 6. CONTACTS: "Is there anyone else in the family with the same symptoms?"      No 7. CAUSE: "What do you think is causing your vomiting?"     unsure 8. HYDRATION STATUS: "Any signs of dehydration?" (e.g., dry mouth [not only dry lips], too weak to stand) "When did you last urinate?"     Dry mouth, just urinated currently 9. OTHER SYMPTOMS: "Do you have any other symptoms?" (e.g., fever, headache, vertigo, vomiting blood or coffee grounds, recent head injury)     Nausea, headache  Protocols used: Vomiting-A-AH

## 2024-01-16 NOTE — ED Provider Notes (Signed)
 Care was taken over from Dr. Deretha Emory.  Patient had presented with a headache and vomiting consistent with her cyclical vomiting syndrome.  Head CT does not show any acute abnormality.  Labs are nonconcerning.  Her urine did have some microscopic hematuria but no signs of infection.  I discussed this with her.  She says that she is already been evaluated by urologist who has looked at her bladder and kidneys and has not found any concerning findings.  She is overall feeling much better.  She denies any headache.  She is drinking without any ongoing vomiting.  She was discharged home in good condition.  She was encouraged to follow-up with her primary care doctor.  Return precautions were given.   Rolan Bucco, MD 01/16/24 [phone]

## 2024-01-16 NOTE — ED Notes (Signed)
 Given PO fluids per patient request

## 2024-01-16 NOTE — ED Provider Notes (Signed)
 [place] EMERGENCY DEPARTMENT AT [facility] Provider Note   CSN: [id number] Arrival date & time: 01/16/24  1010     History  Chief Complaint  Patient presents with   Emesis    Dana Greer is a 53 y.o. female.  Patient with acute onset of nausea and vomiting that occurred at 3 in the morning.  Then followed by headache all over.  Patient states she has had a history of cyclical vomiting has seen GI and has had gastric emptying studies that have been able to figure out the cause.  She says that normally she responds well to Zofran and Reglan and Xanax.  No diarrhea with it not vomiting any blood.  Patient has not been able to take her blood pressure medicine today.  Blood pressure is elevated was 224/122.  That was the blood pressure in triage.  A little bit lower 190s in the room.  Patient denies any marijuana use.  Past medical history sniffing for headaches depression thrombocytopenia immune deficiency disorder fibromyalgia hypertension.  Past surgical history significant for abdominal hysterectomy.  Patient is never used any tobacco products.       Home Medications Prior to Admission medications   Medication Sig Start Date End Date Taking? Authorizing Provider  ALPRAZolam Prudy Feeler) 1 MG tablet Take 1 mg by mouth 4 (four) times daily as needed for anxiety or sleep.     [provider]  amitriptyline (ELAVIL) 25 MG tablet Take 2 tablets (50 mg total) by mouth daily. 05/18/20   Darlin Drop, DO  amLODipine (NORVASC) 10 MG tablet Take 1 tablet (10 mg total) by mouth daily. 12/06/22   [name], Audrie Gallus, MD  butalbital-acetaminophen-caffeine (FIORICET) [phone] MG tablet Take 1-2 tablets by mouth every 6 (six) hours as needed for headache. 03/24/23 03/23/24  Curatolo, Adam, DO  cloNIDine (CATAPRES - DOSED IN MG/24 HR) 0.2 mg/24hr patch APPLY 1 PATCH TOPICALLY TO SKIN  ONCE WEEKLY 11/27/23   [name], Audrie Gallus, MD  cloNIDine (CATAPRES) 0.1 MG tablet Take 1 tablet (0.1 mg  total) by mouth 2 (two) times daily as needed (for pressure >170). 05/22/23   Antonieta Iba, MD  cyclobenzaprine (FLEXERIL) 10 MG tablet Take 1 tablet (10 mg total) by mouth at bedtime as needed for muscle spasms. 10/05/21   [name], Audrie Gallus, MD  EPINEPHrine (EPIPEN 2-PAK) 0.3 mg/0.3 mL IJ SOAJ injection Inject 0.3 mg into the muscle as needed for anaphylaxis. 12/30/21   Marcelyn Bruins, MD  etonogestrel (NEXPLANON) 68 MG IMPL implant Inject 1 each into the skin continuous. Reported on 01/27/2016    [provider]  famotidine (PEPCID) 20 MG tablet Take 1 tablet (20 mg total) by mouth 2 (two) times daily. 09/25/23   [name], Audrie Gallus, MD  meclizine (ANTIVERT) 25 MG tablet Take 50 mg by mouth as needed for dizziness.    [provider]  metoCLOPramide (REGLAN) 10 MG tablet TAKE ONE TABLET BY MOUTH EVERY 6 HOURS AS NEEDED FOR NAUSEA OR VOMITING Patient taking differently: Take 10 mg by mouth every 6 (six) hours as needed for vomiting or nausea. 09/30/22   [name], Audrie Gallus, MD  ondansetron (ZOFRAN) 8 MG tablet Take 8 mg by mouth 3 (three) times daily. 06/24/21   [provider]  pantoprazole (PROTONIX) 40 MG tablet Take 1 tablet (40 mg total) by mouth daily. 09/25/23   [name], Audrie Gallus, MD  promethazine (PHENERGAN) 25 MG suppository Place 1 suppository (25 mg total) rectally every 6 (six)  hours as needed for nausea or vomiting. 04/26/22   [name], Audrie Gallus, MD  valACYclovir (VALTREX) 500 MG tablet Take 1 tablet (500 mg total) by mouth 2 (two) times daily. 11/07/23   [name], Audrie Gallus, MD      Allergies    Trazodone and nefazodone, Amoxicillin, Trazodone, and Carafate [sucralfate]    Review of Systems   Review of Systems  Constitutional:  Negative for chills and fever.  HENT:  Negative for ear pain and sore throat.   Eyes:  Negative for pain and visual disturbance.  Respiratory:  Negative for cough and shortness of breath.   Cardiovascular:  Negative for chest pain and  palpitations.  Gastrointestinal:  Positive for nausea and vomiting. Negative for abdominal pain.  Genitourinary:  Negative for dysuria and hematuria.  Musculoskeletal:  Negative for arthralgias and back pain.  Skin:  Negative for color change and rash.  Neurological:  Positive for headaches. Negative for seizures and syncope.  All other systems reviewed and are negative.   Physical Exam Updated Vital Signs BP (!) 217/99   Pulse 79   Temp 98.4 F (36.9 C) (Temporal)   Resp 18   Ht 1.575 m (5\' 2" )   Wt 88 kg   LMP 10/16/2019 (Within Days) Comment: had hysterectomy-still has half of her uterus and ovaries  SpO2 100%   BMI 35.48 kg/m  Physical Exam Vitals and nursing note reviewed.  Constitutional:      General: She is not in acute distress.    Appearance: Normal appearance. She is well-developed.  HENT:     Head: Normocephalic and atraumatic.     Mouth/Throat:     Mouth: Mucous membranes are dry.  Eyes:     Extraocular Movements: Extraocular movements intact.     Conjunctiva/sclera: Conjunctivae normal.     Pupils: Pupils are equal, round, and reactive to light.  Cardiovascular:     Rate and Rhythm: Normal rate and regular rhythm.     Heart sounds: No murmur heard. Pulmonary:     Effort: Pulmonary effort is normal. No respiratory distress.     Breath sounds: Normal breath sounds.  Abdominal:     Palpations: Abdomen is soft.     Tenderness: There is no abdominal tenderness.  Musculoskeletal:        General: No swelling.     Cervical back: Neck supple. No rigidity.  Skin:    General: Skin is warm and dry.     Capillary Refill: Capillary refill takes less than 2 seconds.  Neurological:     General: No focal deficit present.     Mental Status: She is alert and oriented to person, place, and time.     Cranial Nerves: No cranial nerve deficit.     Sensory: No sensory deficit.     Motor: No weakness.  Psychiatric:        Mood and Affect: Mood normal.     ED Results  / Procedures / Treatments   Labs (all labs ordered are listed, but only abnormal results are displayed) Labs Reviewed  CBC - Abnormal; Notable for the following components:      Result Value   WBC 11.0 (*)    Platelets 125 (*)    All other components within normal limits  URINALYSIS, ROUTINE W REFLEX MICROSCOPIC - Abnormal; Notable for the following components:   Glucose, UA 100 (*)    Hgb urine dipstick MODERATE (*)    Ketones, ur TRACE (*)    Protein, ur  TRACE (*)    All other components within normal limits  COMPREHENSIVE METABOLIC PANEL  LIPASE, BLOOD    EKG EKG Interpretation Date/Time:  Tuesday January 16 2024 10:39:42 EDT Ventricular Rate:  59 PR Interval:  178 QRS Duration:  92 QT Interval:  484 QTC Calculation: 480 R Axis:   56  Text Interpretation: Sinus rhythm Abnormal R-wave progression, early transition No significant change since last tracing Confirmed by Vanetta Mulders [phone]) on 01/16/2024 10:44:35 AM  Radiology No results found.  Procedures Procedures    Medications Ordered in ED Medications  HYDROmorphone (DILAUDID) injection 1 mg (has no administration in time range)  LORazepam (ATIVAN) injection 1 mg (has no administration in time range)  metoCLOPramide (REGLAN) injection 10 mg (has no administration in time range)  sodium chloride 0.9 % bolus 1,000 mL (1,000 mLs Intravenous New Bag/Given 01/16/24 1104)  ondansetron (ZOFRAN) injection 4 mg (4 mg Intravenous Given 01/16/24 1105)  ondansetron (ZOFRAN) injection 4 mg (4 mg Intravenous Given 01/16/24 1148)    ED Course/ Medical Decision Making/ A&P                                 Medical Decision Making Amount and/or Complexity of Data Reviewed Labs: ordered. Radiology: ordered.  Risk Prescription drug management.   Patient symptoms most likely consistent with her cyclical vomiting.  Does not seem to be consistent with a viral gastroenteritis.  CBC white count 11 hemoglobin 14 platelets are  good at 125.  Urinalysis significant for 21-50 red blood cells but no white blood cells.  No bacteria.  EKG without any acute findings.  Lipase and complete metabolic panel pending.  Go ahead and add on Reglan and Ativan to her regiment as well as hydromorphone for the headache and will get CT head.  Clinically blood pressure is elevated but under the circumstances of the frequent vomiting and not being able to take her blood pressure this would be expected.  1 of patient's blood pressure medicine is a clonidine patch as well as clonidine 0.1 mg tablet 2 times a day.   Final Clinical Impression(s) / ED Diagnoses Final diagnoses:  Cyclical vomiting  Primary hypertension  Acute intractable headache, unspecified headache type    Rx / DC Orders ED Discharge Orders     None         Vanetta Mulders, MD 01/16/24 1233

## 2024-01-25 ENCOUNTER — Ambulatory Visit (HOSPITAL_BASED_OUTPATIENT_CLINIC_OR_DEPARTMENT_OTHER)

## 2024-01-25 ENCOUNTER — Ambulatory Visit (INDEPENDENT_AMBULATORY_CARE_PROVIDER_SITE_OTHER): Admitting: Student

## 2024-01-25 ENCOUNTER — Encounter (HOSPITAL_BASED_OUTPATIENT_CLINIC_OR_DEPARTMENT_OTHER): Payer: Self-pay | Admitting: Student

## 2024-01-25 DIAGNOSIS — M7062 Trochanteric bursitis, left hip: Secondary | ICD-10-CM | POA: Diagnosis not present

## 2024-01-25 DIAGNOSIS — M25552 Pain in left hip: Secondary | ICD-10-CM

## 2024-01-25 MED ORDER — TRIAMCINOLONE ACETONIDE 40 MG/ML IJ SUSP
2.0000 mL | INTRAMUSCULAR | Status: AC | PRN
  Administered 2024-01-25: 2 mL via INTRA_ARTICULAR

## 2024-01-25 MED ORDER — LIDOCAINE HCL 1 % IJ SOLN
4.0000 mL | INTRAMUSCULAR | Status: AC | PRN
  Administered 2024-01-25: 4 mL

## 2024-01-25 NOTE — Progress Notes (Signed)
 Chief Complaint: Left hip pain     History of Present Illness:    Dana Greer is a 53 y.o. female presenting today for evaluation of lateral left hip pain.  This current episode began a few months ago without any known injury.  Does have a history of hip bursitis in the past but unsure what treatment she had.  She reports being unable to lay on her left side at night due to pain.  Pain levels are moderate today and are worsened with walking or long periods of standing.  Denies any groin pain pain or symptoms radiating down the leg.  Has not utilize any treatments at this time.   Surgical History:   None  PMH/PSH/Family History/Social History/Meds/Allergies:    Past Medical History:  Diagnosis Date   Arthritis    Depression    Fibromyalgia    Headache(784.0)    Hx of adenomatous polyp of colon 02/14/2020   Hypertension    Immune deficiency disorder (HCC)    Migraine headache    Shingles    Stroke (HCC)    Thrombocytopenia (HCC) 10/01/2012   Past Surgical History:  Procedure Laterality Date   ABDOMINAL HYSTERECTOMY     CARPAL TUNNEL RELEASE Left 09/25/2018   Procedure: LEFT CARPAL TUNNEL RELEASE;  Surgeon: Cindee Salt, MD;  Location: [facility];  Service: Orthopedics;  Laterality: Left;   CESAREAN SECTION  2008   x1   PARTIAL HYSTERECTOMY  2012   due to fibroid   UPPER GASTROINTESTINAL ENDOSCOPY     WISDOM TOOTH EXTRACTION  1995   Social History   Socioeconomic History   Marital status: Legally Separated    Spouse name: Not on file   Number of children: 3   Years of education: college   Highest education level: Some college, no degree  Occupational History   Occupation: Unemployed  Tobacco Use   Smoking status: Never   Smokeless tobacco: Never  Vaping Use   Vaping status: Never Used  Substance and Sexual Activity   Alcohol use: Yes    Comment: occasional   Drug use: Yes    Types: Marijuana     Comment: smokes small amount twice daily   Sexual activity: Not Currently    Birth control/protection: None  Other Topics Concern   Not on file  Social History Narrative   Lives at home with her children.   6 cups caffeine per day.   Right-handed.   Social Drivers of Corporate investment banker Strain: Low Risk  (09/06/2023)   Received from [facility]   Overall Financial Resource Strain (CARDIA)    Difficulty of Paying Living Expenses: Not hard at all  Food Insecurity: No Food Insecurity (09/06/2023)   Received from [facility]   Hunger Vital Sign    Worried About Running Out of Food in the Last Year: Never true    Ran Out of Food in the Last Year: Never true  Transportation Needs: No Transportation Needs (09/06/2023)   Received from [facility] - Transportation    In the past 12 months, has lack of transportation kept you from medical appointments or from getting medications?: No    Lack of Transportation (Non-Medical): No  Physical Activity: Insufficiently Active (08/22/2023)  Exercise Vital Sign    Days of Exercise per Week: 3 days    Minutes of Exercise per Session: 20 min  Stress: Stress Concern Present (08/22/2023)   [employer] of Occupational Health - Occupational Stress Questionnaire    Feeling of Stress : Rather much  Social Connections: Moderately Integrated (08/22/2023)   Social Connection and Isolation Panel [NHANES]    Frequency of Communication with Friends and Family: Three times a week    Frequency of Social Gatherings with Friends and Family: Twice a week    Attends Religious Services: More than 4 times per year    Active Member of [employer] or Organizations: Yes    Attends Engineer, structural: More than 4 times per year    Marital Status: Separated  Recent Concern: Social Connections - Moderately Isolated (06/07/2023)   Social Connection and Isolation Panel [NHANES]    Frequency of  Communication with Friends and Family: Once a week    Frequency of Social Gatherings with Friends and Family: Once a week    Attends Religious Services: More than 4 times per year    Active Member of [employer] or Organizations: Yes    Attends Engineer, structural: More than 4 times per year    Marital Status: Separated   Family History  Problem Relation Age of Onset   Healthy Mother    Diabetes Father    COPD Father    Breast cancer Paternal Aunt    Lung cancer Paternal Aunt    Lung cancer Paternal Uncle    Breast cancer Paternal Grandmother    Colon cancer Paternal Grandmother    Rectal cancer Paternal Grandmother    Esophageal cancer Cousin    Stomach cancer Neg Hx    Allergies  Allergen Reactions   Trazodone And Nefazodone Nausea And Vomiting   Amoxicillin Other (See Comments)    Yeast infection    Trazodone Nausea And Vomiting   Carafate [Sucralfate] Nausea And Vomiting   Current Outpatient Medications  Medication Sig Dispense Refill   ALPRAZolam (XANAX) 1 MG tablet Take 1 mg by mouth 4 (four) times daily as needed for anxiety or sleep.      amitriptyline (ELAVIL) 25 MG tablet Take 2 tablets (50 mg total) by mouth daily. 60 tablet 0   amLODipine (NORVASC) 10 MG tablet Take 1 tablet (10 mg total) by mouth daily. 90 tablet 3   butalbital-acetaminophen-caffeine (FIORICET) 50-325-40 MG tablet Take 1-2 tablets by mouth every 6 (six) hours as needed for headache. 20 tablet 0   cloNIDine (CATAPRES - DOSED IN MG/24 HR) 0.2 mg/24hr patch APPLY 1 PATCH TOPICALLY TO SKIN  ONCE WEEKLY 12 patch 3   cloNIDine (CATAPRES) 0.1 MG tablet Take 1 tablet (0.1 mg total) by mouth 2 (two) times daily as needed (for pressure >170). 60 tablet 4   cyclobenzaprine (FLEXERIL) 10 MG tablet Take 1 tablet (10 mg total) by mouth at bedtime as needed for muscle spasms. 30 tablet 3   EPINEPHrine (EPIPEN 2-PAK) 0.3 mg/0.3 mL IJ SOAJ injection Inject 0.3 mg into the muscle as needed for anaphylaxis. 2 each  1   etonogestrel (NEXPLANON) 68 MG IMPL implant Inject 1 each into the skin continuous. Reported on 01/27/2016     famotidine (PEPCID) 20 MG tablet Take 1 tablet (20 mg total) by mouth 2 (two) times daily. 180 tablet 3   meclizine (ANTIVERT) 25 MG tablet Take 50 mg by mouth as needed for dizziness.     metoCLOPramide (REGLAN)  10 MG tablet TAKE ONE TABLET BY MOUTH EVERY 6 HOURS AS NEEDED FOR NAUSEA OR VOMITING (Patient taking differently: Take 10 mg by mouth every 6 (six) hours as needed for vomiting or nausea.) 60 tablet 0   ondansetron (ZOFRAN) 8 MG tablet Take 8 mg by mouth 3 (three) times daily.     pantoprazole (PROTONIX) 40 MG tablet Take 1 tablet (40 mg total) by mouth daily. 90 tablet 3   promethazine (PHENERGAN) 25 MG suppository Place 1 suppository (25 mg total) rectally every 6 (six) hours as needed for nausea or vomiting. 20 each 0   valACYclovir (VALTREX) 500 MG tablet Take 1 tablet (500 mg total) by mouth 2 (two) times daily. 10 tablet 3   No current facility-administered medications for this visit.   No results found.  Review of Systems:   A ROS was performed including pertinent positives and negatives as documented in the HPI.  Physical Exam :   Constitutional: NAD and appears stated age Neurological: Alert and oriented Psych: Appropriate affect and cooperative Last menstrual period 10/16/2019.   Comprehensive Musculoskeletal Exam:    Tenderness with palpation directly over the greater trochanter of the left hip.  Painless passive hip range of motion to 120 degrees forward flexion, 30 degrees external rotation, 20 degrees internal rotation.  Negative Faber.  No tenderness over the lumbar spine.  Imaging:   Xray (AP pelvis, left hip 3 views): Normal-appearing hip radiographs without evidence of acute bony abnormality   I personally reviewed and interpreted the radiographs.   Assessment:   53 y.o. female with 8-month history of lateral left hip pain consistent with  trochanteric bursitis.  Pain is persistent and is preventing her from being able to lay on her left side.  In discussion of treatment options I have recommended a cortisone injection of the greater trochanter which I am hopeful will bring her significant relief.  Patient is agreeable and injection was performed today under ultrasound guidance and she tolerated this well.  Will plan to have her return to clinic as needed.  Plan :    -Left hip trochanteric cortisone injection performed today -Return to clinic as needed    Procedure Note  Patient: Dana Greer             Date of Birth: [birth year]           MRN: [id number]             Visit Date: 01/25/2024  Procedures: Visit Diagnoses:  1. Trochanteric bursitis, left hip     Large Joint Inj: L greater trochanter on 01/25/2024 9:24 AM Indications: pain Details: 22 G 3.5 in needle, lateral approach Medications: 4 mL lidocaine 1 %; 2 mL triamcinolone acetonide 40 MG/ML Outcome: tolerated well, no immediate complications Procedure, treatment alternatives, risks and benefits explained, specific risks discussed. Consent was given by the patient. Immediately prior to procedure a time out was called to verify the correct patient, procedure, equipment, support staff and site/side marked as required. Patient was prepped and draped in the usual sterile fashion.       I personally saw and evaluated the patient, and participated in the management and treatment plan.  Hazle Nordmann, PA-C Orthopedics

## 2024-02-06 ENCOUNTER — Other Ambulatory Visit: Payer: Self-pay | Admitting: Obstetrics and Gynecology

## 2024-02-06 DIAGNOSIS — Z1329 Encounter for screening for other suspected endocrine disorder: Secondary | ICD-10-CM

## 2024-02-08 ENCOUNTER — Ambulatory Visit: Attending: Medical | Admitting: Medical

## 2024-02-08 ENCOUNTER — Encounter: Payer: Self-pay | Admitting: Medical

## 2024-02-08 VITALS — BP 134/94 | HR 76 | Ht 62.0 in | Wt 172.4 lb

## 2024-02-08 DIAGNOSIS — I10 Essential (primary) hypertension: Secondary | ICD-10-CM

## 2024-02-08 DIAGNOSIS — I5032 Chronic diastolic (congestive) heart failure: Secondary | ICD-10-CM

## 2024-02-08 NOTE — Patient Instructions (Signed)
 Medication Instructions:  Your physician recommends that you continue on your current medications as directed. Please refer to the Current Medication list given to you today.   *If you need a refill on your cardiac medications before your next appointment, please call your pharmacy*  Lab Work: No labs ordered today   Testing/Procedures: No test ordered today   Follow-Up: At [facility], you and your health needs are our priority.  As part of our continuing mission to provide you with exceptional heart care, our providers are all part of one team.  This team includes your primary Cardiologist (physician) and Advanced Practice Providers or APPs (Physician Assistants and Nurse Practitioners) who all work together to provide you with the care you need, when you need it.  Your next appointment:   1 year(s)  Provider:   You may see Yvonne Kendall, MD or one of the following Advanced Practice Providers on your designated Care Team:   Nicolasa Ducking, NP Ames Dura, PA-C Eula Listen, PA-C Cadence Grayson, PA-C Charlsie Quest, NP Carlos Levering, NP    We recommend signing up for the patient portal called "MyChart".  Sign up information is provided on this After Visit Summary.  MyChart is used to connect with patients for Virtual Visits (Telemedicine).  Patients are able to view lab/test results, encounter notes, upcoming appointments, etc.  Non-urgent messages can be sent to your provider as well.   To learn more about what you can do with MyChart, go to [email or web address].

## 2024-02-08 NOTE — Progress Notes (Unsigned)
 Cardiology Office Note:  .   Date:  02/09/2024  ID:  Dana Greer, DOB November 01, 1970, MRN [id number] PCP: Judy Pimple, MD  Dana Greer Providers Cardiologist:  Yvonne Kendall, MD     History of Present Illness: .   Dana Greer is a 53 y.o. female with a history of depression, headache, fibromyalgia, hypertension, HFimpEF, and stroke who presents for follow-up.  She was seen in the ED 10/05/22. She has severe hypertension > 200 systolic.  Echocardiogram 10/06/2022 showed EF 45 to 50%, new wall motion abnormality when compared to previous echo.  Outpatient Lexiscan Myoview was arranged.  Stress test was low risk and no revascularization was indicated.  She did have some serial troponins in the ED greatest was 437.  Felt to be demand ischemia.  Cholecystitis was found on CTA.  She underwent a HIDA scan which was reassuring and she was treated conservatively with IV antibiotics and IV fluids.   She was seen in the ED 10/08/2022 for left arm swelling.  She had recently been admitted and discharged for AKI concerning for cholecystitis with a reassuring HIDA scan, treated with IV fluids and IV antibiotics.  Now presents with swelling in her left upper arm.  She states she had an IV removed at discharge and noticed some swelling in her left upper arm with associated pain.  No redness.  No fevers or chills.  No purulence.  On arrival, vitals are stable.  Differential diagnosis included possible cellulitis versus abscess.  Bedside ultrasound was performed which revealed cobblestoning concerning for fluid in the soft tissue.  No surrounding erythema.  As a precaution she was placed on antibiotics.  Recommended compression and elevation to the affected extremity.   She also complained of some chest pain and an echocardiogram was ordered.  She was found to have LVEF 45 to 50%.  She did have some regional wall motion abnormalities.  Because of this, a Lexiscan Myoview stress was ordered.   Fortunately, the study was normal and considered low risk.  EF estimated to be 62%.  The patient was last seen 05/2023 and was stable from a cardiac perspective.   Today, the patient denies any significant changes. She denies chest pain. She has SOB that is random. It's not worse with ambulation. She denies lower leg edema, orthopnea, pnd. She has vertigo. She has occasional palpitations, but feels these are tolerable. The patient reports 11 years of episodes of N,V, HA and elevated BP. She was recently seen in the ER for this 3/18 and BP was 217/99.    Studies Reviewed: Marland Kitchen       Myoview lexiscan 09/2022   The study is normal. The study is low risk.   No ST deviation was noted. TWI in leads III, aVF during stress   LV perfusion is normal. There is no evidence of ischemia. There is no evidence of infarction.   Left ventricular function is normal. Nuclear stress EF: 62 %. The left ventricular ejection fraction is normal (55-65%). End diastolic cavity size is normal. End systolic cavity size is normal.   Prior study not available for comparison.   1. Fixed apical inferior perfusion defect with normal wall motion, consistent with artifact 2. Low risk study  Echo limited 09/2022  1. No apparent apical thrombus noted. Left ventricular ejection fraction,  by estimation, is 45 to 50%. The left ventricle has mildly decreased  function. The left ventricle demonstrates regional wall motion  abnormalities (see scoring diagram/findings for  description). Left ventricular diastolic parameters are indeterminate.   2. Right ventricular systolic function was not well visualized. The right  ventricular size is not well visualized.   3. The mitral valve is normal in structure. not assessed mitral valve  regurgitation. Not assessed mitral stenosis.   4. The aortic valve was not well visualized. Aortic valve regurgitation  Not assessed. not assessed.   Echo 04/2022 1. Left ventricular ejection fraction, by  estimation, is 60 to 65%. The  left ventricle has normal function. The left ventricle has no regional  wall motion abnormalities. Left ventricular diastolic parameters are  consistent with Grade I diastolic  dysfunction (impaired relaxation).   2. Right ventricular systolic function is normal. The right ventricular  size is normal. Tricuspid regurgitation signal is inadequate for assessing  PA pressure.   3. Left atrial size was mildly dilated.   4. The mitral valve is normal in structure. Mild mitral valve  regurgitation. No evidence of mitral stenosis.   5. The aortic valve is normal in structure. Aortic valve regurgitation is  not visualized. No aortic stenosis is present.   6. The inferior vena cava is normal in size with greater than 50%  respiratory variability, suggesting right atrial pressure of 3 mmHg.   Physical Exam:   VS:  BP (!) 134/94 (BP Location: Left Arm, Patient Position: Sitting, Cuff Size: Normal)   Pulse 76   Ht 5\' 2"  (1.575 m)   Wt 172 lb 6.4 oz (78.2 kg)   LMP 10/16/2019 (Within Days) Comment: had hysterectomy-still has half of her uterus and ovaries  SpO2 98%   BMI 31.53 kg/m    Wt Readings from Last 3 Encounters:  02/08/24 172 lb 6.4 oz (78.2 kg)  01/16/24 194 lb 0.1 oz (88 kg)  08/22/23 194 lb (88 kg)    GEN: Well nourished, well developed in no acute distress NECK: No JVD; No carotid bruits CARDIAC: RRR, no murmurs, rubs, gallops RESPIRATORY:  Clear to auscultation without rales, wheezing or rhonchi  ABDOMEN: Soft, non-tender, non-distended EXTREMITIES:  No edema; No deformity   ASSESSMENT AND PLAN: .    HTN Patient reports episodes of N/V, headache and elevated BP for 11 years. She feels this may be from the Paxil and has stopped this. BP today is 134/94. She is on the clonidine patch and amlodipine 10mg  daily. Low salt diet was recommended.   H/o CM Echo in 2023 showed LVEF 45-50%. Stress test was low risk with normal EF. The patient is  euvolemic on exam.       Dispo: Follow-up in 1 year  Signed, Khoury Siemon David Stall, PA-C

## 2024-02-15 ENCOUNTER — Ambulatory Visit
Admission: RE | Admit: 2024-02-15 | Discharge: 2024-02-15 | Disposition: A | Discharge status: Home / Self Care | Source: Ambulatory Visit | Attending: Obstetrics and Gynecology | Admitting: Obstetrics and Gynecology

## 2024-02-15 DIAGNOSIS — Z1329 Encounter for screening for other suspected endocrine disorder: Secondary | ICD-10-CM

## 2024-02-20 ENCOUNTER — Encounter: Payer: Self-pay | Admitting: Family Medicine

## 2024-02-21 NOTE — Telephone Encounter (Signed)
 I had placed a referral to GI (either [facility] or [facility])   Can someone check on status ?  I cannot tell from looking at it, thanks

## 2024-02-22 NOTE — Telephone Encounter (Signed)
 Referral was faxed to [facility] on 04/02 with MyChart message sent to patient as well.

## 2024-02-23 NOTE — Telephone Encounter (Signed)
 I thought she wanted [facility] or [facility] - I know [facility] is [facility] but was she ok with [facility]?

## 2024-03-08 ENCOUNTER — Ambulatory Visit (INDEPENDENT_AMBULATORY_CARE_PROVIDER_SITE_OTHER): Admitting: Family Medicine

## 2024-03-08 VITALS — BP 124/84 | HR 84 | Temp 98.4°F | Ht 62.0 in | Wt 177.0 lb

## 2024-03-08 DIAGNOSIS — R1115 Cyclical vomiting syndrome unrelated to migraine: Secondary | ICD-10-CM | POA: Diagnosis not present

## 2024-03-08 DIAGNOSIS — I10 Essential (primary) hypertension: Secondary | ICD-10-CM

## 2024-03-08 DIAGNOSIS — R7303 Prediabetes: Secondary | ICD-10-CM

## 2024-03-08 DIAGNOSIS — E282 Polycystic ovarian syndrome: Secondary | ICD-10-CM | POA: Diagnosis not present

## 2024-03-08 DIAGNOSIS — F4323 Adjustment disorder with mixed anxiety and depressed mood: Secondary | ICD-10-CM

## 2024-03-08 DIAGNOSIS — R1010 Upper abdominal pain, unspecified: Secondary | ICD-10-CM

## 2024-03-08 DIAGNOSIS — E559 Vitamin D deficiency, unspecified: Secondary | ICD-10-CM

## 2024-03-08 DIAGNOSIS — E538 Deficiency of other specified B group vitamins: Secondary | ICD-10-CM

## 2024-03-08 MED ORDER — AMITRIPTYLINE HCL 10 MG PO TABS: 10.0000 mg | ORAL_TABLET | Freq: Every morning | ORAL | 1 refills | Status: DC

## 2024-03-08 MED ORDER — ONDANSETRON HCL 8 MG PO TABS: 8.0000 mg | ORAL_TABLET | Freq: Three times a day (TID) | ORAL | 3 refills | Status: DC

## 2024-03-08 NOTE — Patient Instructions (Addendum)
 Take care of yourself  Continue your regular amitriptyline  at night  Add a smaller dose (10 mg ) in the am to see if it helps with abdominal pain   If that is too sedating then cut to 5 mg   Let us  know how it goes  Reach out to psychiatry and let them know what is going on    Call this clinic to schedule a GI appointment  Any problems let me know   [facility]  Call for an appointment  [facility]    [address] [place] [address]  P:  [phone] F:  [phone]

## 2024-03-08 NOTE — Progress Notes (Unsigned)
 Subjective:    Patient ID: Dana Greer, female    DOB: Aug 15, 1971, 53 y.o.   MRN: [id number]  HPI  Wt Readings from Last 3 Encounters:  03/08/24 177 lb (80.3 kg)  02/08/24 172 lb 6.4 oz (78.2 kg)  01/16/24 194 lb 0.1 oz (88 kg)   32.37 kg/m  Vitals:   03/08/24 1130  BP: 124/84  Pulse: 84  Temp: 98.4 F (36.9 C)  SpO2: 97%    Has eaten less with symptoms and weight is down   Pt presents for follow up of chronic health problems including nausea and vomiting  Also HTN      HTN has been controlled  Saw cardiology  bp is stable today  No cp or palpitations or headaches or edema  No side effects to medicines  BP Readings from Last 3 Encounters:  03/08/24 124/84  02/08/24 (!) 134/94  01/16/24 (!) 115/91    EF 45-50% in 2023 with low risk stress test   Pulse Readings from Last 3 Encounters:  03/08/24 84  02/08/24 76  01/16/24 76   Clonidine  patch 0.2 mg  Oral clonidine  0.1 mg bid prn  Amlodipine  10 mg daily      Lab Results  Component Value Date   NA 136 01/16/2024   K 3.3 (L) 01/16/2024   CO2 26 01/16/2024   GLUCOSE 138 (H) 01/16/2024   BUN 9 01/16/2024   CREATININE 0.83 01/16/2024   CALCIUM 9.9 01/16/2024   GFR 75.72 11/03/2022   GFRNONAA >60 01/16/2024    Chronic n/v Has seen GI  Normal EGD and colonoscopy 2021 HIDA CT scans  ? Of possible adhesions from gyn surgery   Taking protonix  Taking pepcid      Psych care  Wonders if paxil  was causing her n/v  When she ran out - stopped getting sick  Started back- n/v returned   Now has not taken paxil  since last ER visit  Nausea/vomiting  is not as bad as it was (not gone but when she gets it - is shorter in duration)   Is more anxious off paxil   Xanax  is prn   Has not been in touch with psychiatry    Still gets severe pain in her abdomen on and off with sweating    [facility] outstanding [facility]   Seeing gyn Dr Adelene Homer Has an ovarian cyst   Started  taking her amitriptyline  for pain (not just mood)  Helps keep pain not be so intense  Usually takes it at night for sleep  If she takes in am helps abd pain     Lab Results  Component Value Date   WBC 11.0 (H) 01/16/2024   HGB 14.2 01/16/2024   HCT 41.5 01/16/2024   MCV 97.2 01/16/2024   PLT 125 (L) 01/16/2024   Lab Results  Component Value Date   ALT 9 01/16/2024   AST 17 01/16/2024   ALKPHOS 54 01/16/2024   BILITOT 0.7 01/16/2024   Sees psychiatry Using coping mechanisms from counselor Toi Foster, who retired)     Patient Active Problem List   Diagnosis Date Noted   Pre-syncope 07/27/2023   Chronic night sweats 07/27/2023   Skin pain 01/08/2023   Ovarian cyst 11/05/2022   Elevated troponin 10/05/2022   Thickening of wall of gallbladder 10/05/2022   Anxiety 10/05/2022   Scoliosis 09/07/2022   Microscopic hematuria 07/05/2022   Heat exhaustion, unspecified, sequela 05/05/2022   Generalized weakness 05/05/2022  Obesity (BMI 30-39.9)    Rash and nonspecific skin eruption 11/15/2021   Perioral dermatitis 11/02/2021   Mixed incontinence 08/16/2021   Elevated serum creatinine 08/12/2021   Urinary frequency 08/12/2021   Herpes zoster without complication 05/04/2021   Hand pain, left 10/14/2020   Swelling of hand joint, left 10/14/2020   TMJ (temporomandibular joint syndrome) 08/18/2020   Non-intractable cyclical vomiting 05/15/2020   Left-sided back pain 02/25/2020   Hx of adenomatous polyp of colon 02/14/2020   Seasonal allergic rhinitis 01/21/2020   Chronic cough 10/04/2019   Headache, post-traumatic, acute 01/28/2019   Head injury, closed 01/17/2019   Abnormal vaginal bleeding 07/06/2018   Neck pain 06/25/2018   Migraine, chronic, without aura 04/12/2018   B12 deficiency 01/10/2018   Foot pain, bilateral 01/09/2018   Hand paresthesia 01/09/2018   Vitamin D  deficiency 01/09/2018   Upper abdominal pain 11/22/2016   Chronic myofascial pain 10/26/2016    Encounter for screening mammogram for breast cancer 10/25/2016   HSV-2 infection 08/17/2016   Adverse effects of medication 11/25/2015   Vertigo 10/08/2015   Carpal tunnel syndrome 03/16/2015   Chronic nausea 02/17/2015   Gout 12/22/2014   Vertiginous migraine 02/03/2014   Intractable migraine 08/31/2012   Osteoarthritis 05/11/2012   Female bladder prolapse 05/11/2012   ADJ DISORDER WITH MIXED ANXIETY & DEPRESSED MOOD 06/30/2008   HYPERHIDROSIS 01/25/2008   BACK PAIN, LUMBAR, CHRONIC 10/16/2007   REACTION, ACUTE STRESS W/EMOTIONAL DSTURB 08/01/2007   Prediabetes 08/01/2007   Hypertension 08/01/2007   Goiter 07/11/2007   POLYCYSTIC OVARIAN DISEASE 05/24/2007   HIRSUTISM 05/24/2007   Arthropathy, multiple sites 05/24/2007   Past Medical History:  Diagnosis Date   Arthritis    Depression    Fibromyalgia    Headache(784.0)    Hx of adenomatous polyp of colon 02/14/2020   Hypertension    Immune deficiency disorder (HCC)    Migraine headache    Shingles    Stroke (HCC)    Thrombocytopenia (HCC) 10/01/2012   Past Surgical History:  Procedure Laterality Date   ABDOMINAL HYSTERECTOMY     CARPAL TUNNEL RELEASE Left 09/25/2018   Procedure: LEFT CARPAL TUNNEL RELEASE;  Surgeon: Lyanne Sample, MD;  Location: [facility];  Service: Orthopedics;  Laterality: Left;   CESAREAN SECTION  2008   x1   PARTIAL HYSTERECTOMY  2012   due to fibroid   UPPER GASTROINTESTINAL ENDOSCOPY     WISDOM TOOTH EXTRACTION  1995   Social History   Tobacco Use   Smoking status: Never   Smokeless tobacco: Never  Vaping Use   Vaping status: Never Used  Substance Use Topics   Alcohol use: Yes    Comment: occasional   Drug use: Yes    Types: Marijuana    Comment: smokes small amount twice daily   Family History  Problem Relation Age of Onset   Healthy Mother    Diabetes Father    COPD Father    Breast cancer Paternal Aunt    Lung cancer Paternal Aunt    Lung cancer Paternal Uncle     Breast cancer Paternal Grandmother    Colon cancer Paternal Grandmother    Rectal cancer Paternal Grandmother    Esophageal cancer Cousin    Stomach cancer Neg Hx    Allergies  Allergen Reactions   Trazodone And Nefazodone Nausea And Vomiting   Amoxicillin  Other (See Comments)    Yeast infection    Trazodone Nausea And Vomiting   Carafate  [Sucralfate ] Nausea And Vomiting  Current Outpatient Medications on File Prior to Visit  Medication Sig Dispense Refill   ALPRAZolam  (XANAX ) 1 MG tablet Take 1 mg by mouth 4 (four) times daily as needed for anxiety or sleep.      amitriptyline  (ELAVIL ) 25 MG tablet Take 2 tablets (50 mg total) by mouth daily. 60 tablet 0   amLODipine  (NORVASC ) 10 MG tablet Take 1 tablet (10 mg total) by mouth daily. 90 tablet 3   cloNIDine  (CATAPRES  - DOSED IN MG/24 HR) 0.2 mg/24hr patch APPLY 1 PATCH TOPICALLY TO SKIN  ONCE WEEKLY 12 patch 3   cloNIDine  (CATAPRES ) 0.1 MG tablet Take 1 tablet (0.1 mg total) by mouth 2 (two) times daily as needed (for pressure >170). 60 tablet 4   cyclobenzaprine  (FLEXERIL ) 10 MG tablet Take 1 tablet (10 mg total) by mouth at bedtime as needed for muscle spasms. 30 tablet 3   famotidine  (PEPCID ) 20 MG tablet Take 1 tablet (20 mg total) by mouth 2 (two) times daily. 180 tablet 3   meclizine  (ANTIVERT ) 25 MG tablet Take 50 mg by mouth as needed for dizziness.     metoCLOPramide  (REGLAN ) 10 MG tablet TAKE ONE TABLET BY MOUTH EVERY 6 HOURS AS NEEDED FOR NAUSEA OR VOMITING (Patient taking differently: Take 10 mg by mouth every 6 (six) hours as needed for vomiting or nausea.) 60 tablet 0   ondansetron  (ZOFRAN ) 8 MG tablet Take 8 mg by mouth 3 (three) times daily.     pantoprazole  (PROTONIX ) 40 MG tablet Take 1 tablet (40 mg total) by mouth daily. 90 tablet 3   valACYclovir  (VALTREX ) 500 MG tablet Take 1 tablet (500 mg total) by mouth 2 (two) times daily. 10 tablet 3   No current facility-administered medications on file prior to visit.     Review of Systems     Objective:   Physical Exam        Assessment & Plan:   Problem List Items Addressed This Visit       Cardiovascular and Mediastinum   Hypertension - Primary     Endocrine   POLYCYSTIC OVARIAN DISEASE     Other   Vitamin D  deficiency   Prediabetes   B12 deficiency

## 2024-03-10 ENCOUNTER — Encounter: Payer: Self-pay | Admitting: Family Medicine

## 2024-03-10 NOTE — Assessment & Plan Note (Addendum)
 Continues psychiatric care  Her counselor retired/ she is seeking new one in future   Pt discovered that her paxil  caused nausea and stopped it  Continues amitriptyline  and prn xanax   Encouraged her to reach out to psychitry to update  Doing fair with anxiety so far but both PHQ and GAD 7 scores are high

## 2024-03-10 NOTE — Assessment & Plan Note (Signed)
 bp in fair control at this time  BP Readings from Last 1 Encounters:  03/08/24 124/84  This does go up when she has vomiting episodes  No changes needed Most recent labs reviewed  Disc lifstyle change with low sodium diet and exercise   Doing well with  Clonidine  patch 0.2 mg  Oral clonidine  0.1 mg bid prn (or in between patches if unable to get) Amlodipine  10 mg daily

## 2024-03-10 NOTE — Assessment & Plan Note (Signed)
 Intermittent /mod to severe and often in the ams  This does not always correlate with n/v (which is episodic)  Pt found amitriptyline  helps but too sedating  Will continue pm dose and add lower am dose (5-10) to see how that goes   Reassuring exam today  Reassuring work up so far  Still working on GI referral -may go to [facility]  Reviewed last labs and imaging today

## 2024-03-10 NOTE — Assessment & Plan Note (Signed)
 Unsure if this adds to abd pain which is often higher up Seeing gyn Dr Ival Marines ? Of adhesions but no bowel obstruction so far

## 2024-03-10 NOTE — Assessment & Plan Note (Addendum)
 This has improved with cessation of paxil  (occational has n/v but does not last nearly as long) This is reassuring Abd pain episodes continue-pt notes her amitriptyline  helps if the takes in am when symptoms are worse but is too sedating  Instructed to continue your usual 50 mg at bedtime  We will add 5-10 mg (depending on tolerance) in am to see if this quiets the pain   Refilled zofran  for prn use today

## 2024-03-14 ENCOUNTER — Encounter: Payer: Self-pay | Admitting: Family Medicine

## 2024-03-20 ENCOUNTER — Encounter: Payer: Self-pay | Admitting: Family Medicine

## 2024-03-22 ENCOUNTER — Encounter: Payer: Self-pay | Admitting: Family Medicine

## 2024-03-22 ENCOUNTER — Ambulatory Visit (INDEPENDENT_AMBULATORY_CARE_PROVIDER_SITE_OTHER): Admitting: Family Medicine

## 2024-03-22 VITALS — BP 131/80 | HR 89 | Temp 98.6°F | Ht 62.0 in | Wt 175.5 lb

## 2024-03-22 DIAGNOSIS — R1010 Upper abdominal pain, unspecified: Secondary | ICD-10-CM

## 2024-03-22 DIAGNOSIS — B029 Zoster without complications: Secondary | ICD-10-CM

## 2024-03-22 DIAGNOSIS — R110 Nausea: Secondary | ICD-10-CM | POA: Diagnosis not present

## 2024-03-22 MED ORDER — VALACYCLOVIR HCL 1 G PO TABS: 1000.0000 mg | ORAL_TABLET | Freq: Three times a day (TID) | ORAL | 0 refills | Status: AC

## 2024-03-22 NOTE — Patient Instructions (Signed)
 Take care of yourself  Watch for worsening of the elbow skin change  Keep clean with soap and water  Cool compress is ok  Unscented moisturizer or vaseline or aquaphor are ok also   Take the generic valtrex  as directed    Update if not starting to improve in a week or if worsening

## 2024-03-22 NOTE — Assessment & Plan Note (Signed)
 Taking small dose of amitriptyline  has helped day time symptoms a bit Will continue this

## 2024-03-22 NOTE — Progress Notes (Signed)
 Subjective:    Patient ID: Dana Greer, female    DOB: February 07, 1971, 53 y.o.   MRN: [id number]  HPI  Wt Readings from Last 3 Encounters:  03/22/24 175 lb 8 oz (79.6 kg)  03/08/24 177 lb (80.3 kg)  02/08/24 172 lb 6.4 oz (78.2 kg)   32.10 kg/m  Vitals:   03/22/24 0758 03/22/24 0822  BP: (!) 144/80 131/80  Pulse: 89   Temp: 98.6 F (37 C)   SpO2: 100%     Pt presents with left side pain  Elbow pain -and rash area   Side pain left has been worse lately  Tingly and burning and itchy  Warm conditions and warm water make it worse Under breast more than it was  Has a rash area on left elbow - sore and arm is itchy  Is very tender to the touch   No fever   Nausea is baseline  Small dose of am amitriptyline  helps abd pain in am /still takes at [facility]    Patient Active Problem List   Diagnosis Date Noted   Pre-syncope 07/27/2023   Chronic night sweats 07/27/2023   Skin pain 01/08/2023   Ovarian cyst 11/05/2022   Elevated troponin 10/05/2022   Thickening of wall of gallbladder 10/05/2022   Anxiety 10/05/2022   Scoliosis 09/07/2022   Microscopic hematuria 07/05/2022   Heat exhaustion, unspecified, sequela 05/05/2022   Generalized weakness 05/05/2022   Obesity (BMI 30-39.9)    Rash and nonspecific skin eruption 11/15/2021   Perioral dermatitis 11/02/2021   Mixed incontinence 08/16/2021   Elevated serum creatinine 08/12/2021   Urinary frequency 08/12/2021   Herpes zoster without complication 05/04/2021   Hand pain, left 10/14/2020   Swelling of hand joint, left 10/14/2020   TMJ (temporomandibular joint syndrome) 08/18/2020   Non-intractable cyclical vomiting 05/15/2020   Left-sided back pain 02/25/2020   Hx of adenomatous polyp of colon 02/14/2020   Seasonal allergic rhinitis 01/21/2020   Chronic cough 10/04/2019   Headache, post-traumatic, acute 01/28/2019   Head injury, closed 01/17/2019   Abnormal vaginal bleeding 07/06/2018   Neck pain 06/25/2018    Migraine, chronic, without aura 04/12/2018   B12 deficiency 01/10/2018   Foot pain, bilateral 01/09/2018   Hand paresthesia 01/09/2018   Vitamin D  deficiency 01/09/2018   Upper abdominal pain 11/22/2016   Chronic myofascial pain 10/26/2016   Encounter for screening mammogram for breast cancer 10/25/2016   HSV-2 infection 08/17/2016   Adverse effects of medication 11/25/2015   Vertigo 10/08/2015   Carpal tunnel syndrome 03/16/2015   Chronic nausea 02/17/2015   Gout 12/22/2014   Vertiginous migraine 02/03/2014   Intractable migraine 08/31/2012   Osteoarthritis 05/11/2012   Female bladder prolapse 05/11/2012   ADJ DISORDER WITH MIXED ANXIETY & DEPRESSED MOOD 06/30/2008   HYPERHIDROSIS 01/25/2008   BACK PAIN, LUMBAR, CHRONIC 10/16/2007   REACTION, ACUTE STRESS W/EMOTIONAL DSTURB 08/01/2007   Prediabetes 08/01/2007   Hypertension 08/01/2007   Goiter 07/11/2007   POLYCYSTIC OVARIAN DISEASE 05/24/2007   HIRSUTISM 05/24/2007   Arthropathy, multiple sites 05/24/2007   Past Medical History:  Diagnosis Date   Arthritis    Depression    Fibromyalgia    Headache(784.0)    Hx of adenomatous polyp of colon 02/14/2020   Hypertension    Immune deficiency disorder (HCC)    Migraine headache    Shingles    Stroke (HCC)    Thrombocytopenia (HCC) 10/01/2012   Past Surgical History:  Procedure Laterality Date   ABDOMINAL HYSTERECTOMY  CARPAL TUNNEL RELEASE Left 09/25/2018   Procedure: LEFT CARPAL TUNNEL RELEASE;  Surgeon: Lyanne Sample, MD;  Location: [facility];  Service: Orthopedics;  Laterality: Left;   CESAREAN SECTION  2008   x1   PARTIAL HYSTERECTOMY  2012   due to fibroid   UPPER GASTROINTESTINAL ENDOSCOPY     WISDOM TOOTH EXTRACTION  1995   Social History   Tobacco Use   Smoking status: Never   Smokeless tobacco: Never  Vaping Use   Vaping status: Never Used  Substance Use Topics   Alcohol use: Yes    Comment: occasional   Drug use: Yes    Types:  Marijuana    Comment: smokes small amount twice daily   Family History  Problem Relation Age of Onset   Healthy Mother    Diabetes Father    COPD Father    Breast cancer Paternal Aunt    Lung cancer Paternal Aunt    Lung cancer Paternal Uncle    Breast cancer Paternal Grandmother    Colon cancer Paternal Grandmother    Rectal cancer Paternal Grandmother    Esophageal cancer Cousin    Stomach cancer Neg Hx    Allergies  Allergen Reactions   Trazodone And Nefazodone Nausea And Vomiting   Amoxicillin  Other (See Comments)    Yeast infection    Paxil  [Paroxetine ]     Vomiting    Trazodone Nausea And Vomiting   Carafate  [Sucralfate ] Nausea And Vomiting   Current Outpatient Medications on File Prior to Visit  Medication Sig Dispense Refill   ALPRAZolam  (XANAX ) 1 MG tablet Take 1 mg by mouth 4 (four) times daily as needed for anxiety or sleep.      amitriptyline  (ELAVIL ) 10 MG tablet Take 1 tablet (10 mg total) by mouth in the morning. 30 tablet 1   amitriptyline  (ELAVIL ) 25 MG tablet Take 2 tablets (50 mg total) by mouth daily. 60 tablet 0   amLODipine  (NORVASC ) 10 MG tablet Take 1 tablet (10 mg total) by mouth daily. 90 tablet 3   cloNIDine  (CATAPRES  - DOSED IN MG/24 HR) 0.2 mg/24hr patch APPLY 1 PATCH TOPICALLY TO SKIN  ONCE WEEKLY 12 patch 3   cloNIDine  (CATAPRES ) 0.1 MG tablet Take 1 tablet (0.1 mg total) by mouth 2 (two) times daily as needed (for pressure >170). 60 tablet 4   cyclobenzaprine  (FLEXERIL ) 10 MG tablet Take 1 tablet (10 mg total) by mouth at bedtime as needed for muscle spasms. 30 tablet 3   famotidine  (PEPCID ) 20 MG tablet Take 1 tablet (20 mg total) by mouth 2 (two) times daily. 180 tablet 3   meclizine  (ANTIVERT ) 25 MG tablet Take 50 mg by mouth as needed for dizziness.     metoCLOPramide  (REGLAN ) 10 MG tablet TAKE ONE TABLET BY MOUTH EVERY 6 HOURS AS NEEDED FOR NAUSEA OR VOMITING (Patient taking differently: Take 10 mg by mouth every 6 (six) hours as needed for  vomiting or nausea.) 60 tablet 0   ondansetron  (ZOFRAN ) 8 MG tablet Take 1 tablet (8 mg total) by mouth 3 (three) times daily. 20 tablet 3   oxybutynin (OXYTROL) 3.9 MG/24HR Place 1 patch onto the skin every 3 (three) days.     pantoprazole  (PROTONIX ) 40 MG tablet Take 1 tablet (40 mg total) by mouth daily. 90 tablet 3   No current facility-administered medications on file prior to visit.    Review of Systems  Constitutional:  Positive for fatigue. Negative for activity change, appetite change, fever  and unexpected weight change.  HENT:  Negative for congestion, ear pain, rhinorrhea, sinus pressure and sore throat.   Eyes:  Negative for pain, redness and visual disturbance.  Respiratory:  Negative for cough, shortness of breath and wheezing.   Cardiovascular:  Negative for chest pain and palpitations.  Gastrointestinal:  Positive for abdominal pain and nausea. Negative for blood in stool, constipation and vomiting.  Endocrine: Negative for polydipsia and polyuria.  Genitourinary:  Negative for dysuria, frequency and urgency.  Musculoskeletal:  Positive for arthralgias, back pain and myalgias.  Skin:  Positive for rash. Negative for pallor.  Allergic/Immunologic: Negative for environmental allergies.  Neurological:  Negative for dizziness, syncope and headaches.  Hematological:  Negative for adenopathy. Does not bruise/bleed easily.  Psychiatric/Behavioral:  Negative for decreased concentration and dysphoric mood. The patient is not nervous/anxious.        Objective:   Physical Exam Constitutional:      General: She is not in acute distress.    Appearance: She is well-developed.  HENT:     Head: Normocephalic and atraumatic.  Eyes:     Conjunctiva/sclera: Conjunctivae normal.     Pupils: Pupils are equal, round, and reactive to light.  Neck:     Thyroid : No thyromegaly.     Vascular: No carotid bruit or JVD.  Cardiovascular:     Rate and Rhythm: Normal rate and regular rhythm.      Heart sounds: Normal heart sounds.     No gallop.  Pulmonary:     Effort: Pulmonary effort is normal. No respiratory distress.     Breath sounds: Normal breath sounds. No wheezing or rales.  Abdominal:     General: There is no distension or abdominal bruit.     Palpations: Abdomen is soft.     Tenderness: There is abdominal tenderness. There is no guarding or rebound.     Comments: Mild epigastric tenderness  Musculoskeletal:     Cervical back: Normal range of motion and neck supple.     Right lower leg: No edema.     Left lower leg: No edema.  Lymphadenopathy:     Cervical: No cervical adenopathy.  Skin:    General: Skin is warm and dry.     Coloration: Skin is not pale.     Findings: No rash.     Comments: Tenderness of skin in left flank and under breast   1-2 cm area of scale and slight hyperpigmentation on left elbow  No rash on trunk /arm or neck   Neurological:     Mental Status: She is alert.     Coordination: Coordination normal.     Deep Tendon Reflexes: Reflexes are normal and symmetric. Reflexes normal.  Psychiatric:        Mood and Affect: Mood normal.           Assessment & Plan:   Problem List Items Addressed This Visit       Nervous and Auditory   Herpes zoster without complication - Primary   Pt continues to have intermittent neuropathic type pain in left side /below breast that is tingly and sometimes itchy  A little in arm as well Today a 1-2 cm area of dry skin on elbow noted -no vesicles and not typical of shingles  In past this pain has responded to valcyclovir -suspect zoster without rash  Has had one shingrix and due for 2nd  Will treat with valcyclovir 1 g tid for 7d  Instructed to call if  symptoms worsen or fail to improve  Reassuring exam Call back and Er precautions noted in detail today        Relevant Medications   valACYclovir  (VALTREX ) 1000 MG tablet     Other   Upper abdominal pain   Taking small dose of amitriptyline   has helped day time symptoms a bit Will continue this        Chronic nausea   No changes No severe bouts since stopping paxil 

## 2024-03-22 NOTE — Assessment & Plan Note (Signed)
 No changes No severe bouts since stopping paxil 

## 2024-03-22 NOTE — Assessment & Plan Note (Signed)
 Pt continues to have intermittent neuropathic type pain in left side /below breast that is tingly and sometimes itchy  A little in arm as well Today a 1-2 cm area of dry skin on elbow noted -no vesicles and not typical of shingles  In past this pain has responded to valcyclovir -suspect zoster without rash  Has had one shingrix and due for 2nd  Will treat with valcyclovir 1 g tid for 7d  Instructed to call if symptoms worsen or fail to improve  Reassuring exam Call back and Er precautions noted in detail today

## 2024-04-08 ENCOUNTER — Encounter: Payer: Self-pay | Admitting: Family Medicine

## 2024-04-08 DIAGNOSIS — R1010 Upper abdominal pain, unspecified: Secondary | ICD-10-CM

## 2024-04-08 DIAGNOSIS — G8929 Other chronic pain: Secondary | ICD-10-CM

## 2024-04-08 NOTE — Telephone Encounter (Signed)
 Referral done  This will replace the prior referral   Thanks   Will route to [facility] referral team to sent in   I made it an internal referral- if you need me to change to external please let me know asap and I will do that Thanks

## 2024-05-16 ENCOUNTER — Other Ambulatory Visit: Payer: Self-pay | Admitting: Family Medicine

## 2024-05-27 ENCOUNTER — Encounter: Payer: Self-pay | Admitting: Family Medicine

## 2024-05-28 ENCOUNTER — Encounter (HOSPITAL_BASED_OUTPATIENT_CLINIC_OR_DEPARTMENT_OTHER): Payer: Self-pay

## 2024-06-20 ENCOUNTER — Encounter: Payer: Self-pay | Admitting: Family Medicine

## 2024-06-20 ENCOUNTER — Telehealth: Payer: Self-pay | Admitting: Family Medicine

## 2024-06-20 NOTE — Telephone Encounter (Signed)
 Copied from CRM #[id number]. Topic: Appointments - Scheduling Inquiry for Clinic >> Jun 20, 2024  1:22 PM Chasity T wrote: Reason for CRM: patient is wanting an tdap shot. Please contact her back to make the appointment

## 2024-06-21 ENCOUNTER — Other Ambulatory Visit (HOSPITAL_BASED_OUTPATIENT_CLINIC_OR_DEPARTMENT_OTHER): Payer: Self-pay

## 2024-06-21 MED ORDER — TETANUS-DIPHTH-ACELL PERTUSSIS 5-2.5-18.5 LF-MCG/0.5 IM SUSY
0.5000 mL | PREFILLED_SYRINGE | Freq: Once | INTRAMUSCULAR | 0 refills | Status: AC
  Filled 2024-06-21: qty 0.5, 1d supply, fill #0

## 2024-06-21 NOTE — Telephone Encounter (Signed)
 See mychart message. Advised pt she has to get Tdap at pharmacy since she had Medicare

## 2024-07-19 ENCOUNTER — Other Ambulatory Visit: Payer: Self-pay | Admitting: Family Medicine

## 2024-07-22 NOTE — Telephone Encounter (Signed)
 Ondansetron  Last filled:  06/16/24, #20 Last OV:  03/22/24, shingles Next OV:  none

## 2024-07-23 ENCOUNTER — Encounter: Payer: Self-pay | Admitting: Family Medicine

## 2024-07-24 ENCOUNTER — Other Ambulatory Visit: Payer: Self-pay | Admitting: Obstetrics and Gynecology

## 2024-07-24 ENCOUNTER — Ambulatory Visit: Payer: Self-pay

## 2024-07-24 DIAGNOSIS — R928 Other abnormal and inconclusive findings on diagnostic imaging of breast: Secondary | ICD-10-CM

## 2024-07-24 DIAGNOSIS — N6320 Unspecified lump in the left breast, unspecified quadrant: Secondary | ICD-10-CM

## 2024-07-24 NOTE — Telephone Encounter (Signed)
 FYI: Chart review shows patient self scheduled with pcp on 07/29/24.    Message from Deaijah H sent at 07/24/2024  8:57 AM EDT  Patient called in to schedule an appointment while trying to get her reason to be seen patient started to get rude and stated Dr. Randeen raker her on [place] stating she wanted her to come in for an OV, decided to reach out to [facility] for more information. But saw in patients chart it is for Diarrhea but patient disconnected

## 2024-07-24 NOTE — Telephone Encounter (Signed)
 FYI Only or Action Required?: FYI only for provider.  Patient was last seen in primary care on 03/22/2024 by Randeen [name], MD.  Called Nurse Triage reporting Diarrhea. Did not want to be triaged.  I returned her call to make her an appt as directed in MyChart message.  Pt very short and curt.  Someone already did that.   Appt already made for 07/29/2024 at 10:30 with Dr. Randeen.  Symptoms began Pt having diarrhea as a result of IBS per her MyChart message sent to Dr. Randeen.   Office responded back and told her she needed to have an office visit.  Message dated 07/24/2024 at 8:07 AM. .  Interventions attempted: Other: Per MyChart message she has tried anti diarrhea medication without relief.  Symptoms are: unchangedStill having diarrhea and stomach pain per MyChart message sent to Dr. Randeen..  Triage Disposition: No disposition on file. Refused triage and appt had already been made.  Pt was short and curt.   Hung up on me after telling me someone had already called and made the appt.    Patient/caregiver understands and will follow disposition?: Answer Assessment - Initial Assessment Questions 1. DIARRHEA SEVERITY: How bad is the diarrhea? How many more stools have you had in the past 24 hours than normal?      Pt refused to be triaged.   I returned her call.   First time I called she answered but asked who is Hermine?   I let her know who I was and that I was calling from [facility] office.   She hung up on me. Thinking I dialed the wrong number I called back.   The same person answered.  I asked if I was speaking to Shanina King-Goins and she said, This is her.   I let her know I was returning her call to get her scheduled with Dr. Randeen per the MyChart message sent to her.   I've already done that.    I apologized and said,  I didn't realize someone had already called you.   She hung up. 2. ONSET: When did the diarrhea begin?      No triage done 3. STOOL DESCRIPTION:   How loose or watery is the diarrhea? What is the stool color? Is there any blood or mucous in the stool?      4. VOMITING: Are you also vomiting? If Yes, ask: How many times in the past 24 hours?       5. ABDOMEN PAIN: Are you having any abdomen pain? If Yes, ask: What does it feel like? (e.g., crampy, dull, intermittent, constant)       6. ABDOMEN PAIN SEVERITY: If present, ask: How bad is the pain?  (e.g., Scale 1-10; mild, moderate, or severe)      7. ORAL INTAKE: If vomiting, Have you been able to drink liquids? How much liquids have you had in the past 24 hours?      8. HYDRATION: Any signs of dehydration? (e.g., dry mouth [not just dry lips], too weak to stand, dizziness, new weight loss) When did you last urinate?      9. EXPOSURE: Have you traveled to a foreign country recently? Have you been exposed to anyone with diarrhea? Could you have eaten any food that was spoiled?      10. ANTIBIOTIC USE: Are you taking antibiotics now or have you taken antibiotics in the past 2 months?        11. OTHER  SYMPTOMS: Do you have any other symptoms? (e.g., fever, blood in stool)        12. PREGNANCY: Is there any chance you are pregnant? When was your last menstrual period?  Protocols used: [facility]

## 2024-07-29 ENCOUNTER — Encounter: Payer: Self-pay | Admitting: Family Medicine

## 2024-07-29 ENCOUNTER — Ambulatory Visit (INDEPENDENT_AMBULATORY_CARE_PROVIDER_SITE_OTHER): Admitting: Family Medicine

## 2024-07-29 VITALS — BP 131/80 | HR 61 | Temp 98.9°F | Ht 62.0 in | Wt 184.4 lb

## 2024-07-29 DIAGNOSIS — K580 Irritable bowel syndrome with diarrhea: Secondary | ICD-10-CM | POA: Diagnosis not present

## 2024-07-29 DIAGNOSIS — K219 Gastro-esophageal reflux disease without esophagitis: Secondary | ICD-10-CM | POA: Insufficient documentation

## 2024-07-29 DIAGNOSIS — D6949 Other primary thrombocytopenia: Secondary | ICD-10-CM | POA: Diagnosis not present

## 2024-07-29 DIAGNOSIS — R1115 Cyclical vomiting syndrome unrelated to migraine: Secondary | ICD-10-CM | POA: Diagnosis not present

## 2024-07-29 DIAGNOSIS — Z23 Encounter for immunization: Secondary | ICD-10-CM | POA: Diagnosis not present

## 2024-07-29 DIAGNOSIS — K589 Irritable bowel syndrome without diarrhea: Secondary | ICD-10-CM | POA: Insufficient documentation

## 2024-07-29 MED ORDER — HYOSCYAMINE SULFATE 0.125 MG SL SUBL: 0.1250 mg | SUBLINGUAL_TABLET | SUBLINGUAL | 0 refills | Status: AC | PRN

## 2024-07-29 NOTE — Assessment & Plan Note (Addendum)
 Episodic and recently worsened  Diarrhea predominant  Episodes cause moderate to severe cramping (bad enough to lie on bathroom floor) with urgent loose stools Triggers may include Dairy  Rich/fatty foods Red meat  Garlic  Has tried FODMAP eating- helpful  Of note-more wheat /whole wheat foods recently (? If gluten intol)   Reviewed EGD and colonoscopy from 2021 Also CT abd/pel from 2021   Today Prescription hyoscamine SL to try during episode for cramping Discussed produce choices/fiber and fluid intake  Continue to avoid known triggers-dairy / beef/fatty or heavy foods   Consider trying a week of no /less gluten Consider trying a week of FODMAP  Keep food journal Then update us    Call back and Er precautions noted in detail today

## 2024-07-29 NOTE — Progress Notes (Signed)
 Subjective:    Patient ID: Dana Greer, female    DOB: 16-Apr-1971, 53 y.o.   MRN: [id number]  HPI  Wt Readings from Last 3 Encounters:  07/29/24 184 lb 6 oz (83.6 kg)  03/22/24 175 lb 8 oz (79.6 kg)  03/08/24 177 lb (80.3 kg)   33.72 kg/m  Vitals:   07/29/24 1019 07/29/24 1044  BP: (!) 152/90 131/80  Pulse: 61   Temp: 98.9 F (37.2 C)   SpO2: 100%      Pt presents for follow up of IBS   Message from patient on 9/23 Good Afternoon, I hope you guys are well! I have been suffering since Sunday with Diarrhea (IBS) the stomach pains are killing me. Is there anything I can do to stop this chaos? I have taken anti diarrhea, the stomach pains and diarrhea continue..the past few days have been disastrous.   She declined triage   Had bout Friday Then mond/tues  Episodes are not long  Diarrhea  Really bad pain - whole abdomen/ cramping / tight feeling - then sweating Looses stool continence when she is cramping   No blood  No fever   Triggers  Rich foods  Steak   Did change the way she eats all together Has stopped eating a lot of garlic or curry   Has tried FODMAP  Thinks it does help  Hard to follow   Digestive enzymes-do not work  Engineer, agricultural a lot of wheat Uses a wheat fiber in her oatmeal Occational fiber breakfast cookie   Took anti diarrhea (? Immodium) and did not help   Does not have diarrhea all the time  Occational hard stool- passes it and then has loose stool    Colonoscopy  01/2020 Diminutive adenoma with recall 2028   EGD 01/2020 Normal    History of chronic nausea  (is doing well right now)   This improved when she stopped paxil  Also improved with amitriptyline  10 mg daily in am  Prn metocolpramide Pepcid  20 mg bid  Pantoprazole  40 mg daily   Last CT abd/pelvis 2021  Narrative & Impression  CLINICAL DATA:  Left upper quadrant pain and left flank pain.   EXAM: CT ABDOMEN AND PELVIS WITH CONTRAST   TECHNIQUE: Multidetector  CT imaging of the abdomen and pelvis was performed using the standard protocol following bolus administration of intravenous contrast.   CONTRAST:  OMNIPAQUE  IOHEXOL  300 MG/ML  SOLN   COMPARISON:  04/28/2015   FINDINGS: Lower chest: No acute abnormality.   Hepatobiliary: No focal liver abnormality is seen. No gallstones, gallbladder wall thickening, or biliary dilatation.   Pancreas: Unremarkable. No pancreatic ductal dilatation or surrounding inflammatory changes.   Spleen: Normal in size without focal abnormality.   Adrenals/Urinary Tract: Normal adrenal glands. No kidney mass or hydronephrosis identified. Urinary bladder appears normal.   Stomach/Bowel: Stomach is within normal limits. Appendix appears normal. No evidence of bowel wall thickening, distention, or inflammatory changes.   Vascular/Lymphatic: No significant vascular findings are present. No enlarged abdominal or pelvic lymph nodes.   Reproductive: Uterus and bilateral adnexa are unremarkable.   Other: No free fluid or fluid collections. Small fat containing umbilical hernia.   Musculoskeletal: Mild scoliosis and degenerative disc disease.   IMPRESSION: 1. No acute findings within the abdomen or pelvis. No findings to explain patient's left upper quadrant abdominal pain and left flank pain. 2. Small fat containing umbilical hernia.         Patient Active Problem List  Diagnosis Date Noted   GERD (gastroesophageal reflux disease) 07/29/2024   IBS (irritable bowel syndrome) 07/29/2024   Pre-syncope 07/27/2023   Chronic night sweats 07/27/2023   Skin pain 01/08/2023   Ovarian cyst 11/05/2022   Elevated troponin 10/05/2022   Thickening of wall of gallbladder 10/05/2022   Anxiety 10/05/2022   Scoliosis 09/07/2022   Microscopic hematuria 07/05/2022   Generalized weakness 05/05/2022   Obesity (BMI 30-39.9)    Rash and nonspecific skin eruption 11/15/2021   Perioral dermatitis 11/02/2021    Mixed incontinence 08/16/2021   Elevated serum creatinine 08/12/2021   Urinary frequency 08/12/2021   Herpes zoster without complication 05/04/2021   Hand pain, left 10/14/2020   Swelling of hand joint, left 10/14/2020   TMJ (temporomandibular joint syndrome) 08/18/2020   Non-intractable cyclical vomiting 05/15/2020   Left-sided back pain 02/25/2020   Hx of adenomatous polyp of colon 02/14/2020   Seasonal allergic rhinitis 01/21/2020   Chronic cough 10/04/2019   Headache, post-traumatic, acute 01/28/2019   Head injury, closed 01/17/2019   Abnormal vaginal bleeding 07/06/2018   Neck pain 06/25/2018   Migraine, chronic, without aura 04/12/2018   B12 deficiency 01/10/2018   Foot pain, bilateral 01/09/2018   Hand paresthesia 01/09/2018   Vitamin D  deficiency 01/09/2018   Upper abdominal pain 11/22/2016   Chronic myofascial pain 10/26/2016   Encounter for screening mammogram for breast cancer 10/25/2016   HSV-2 infection 08/17/2016   Adverse effects of medication 11/25/2015   Vertigo 10/08/2015   Carpal tunnel syndrome 03/16/2015   Chronic nausea 02/17/2015   Gout 12/22/2014   Vertiginous migraine 02/03/2014   Intractable migraine 08/31/2012   Osteoarthritis 05/11/2012   Female bladder prolapse 05/11/2012   ADJ DISORDER WITH MIXED ANXIETY & DEPRESSED MOOD 06/30/2008   HYPERHIDROSIS 01/25/2008   BACK PAIN, LUMBAR, CHRONIC 10/16/2007   REACTION, ACUTE STRESS W/EMOTIONAL DSTURB 08/01/2007   Prediabetes 08/01/2007   Hypertension 08/01/2007   Goiter 07/11/2007   POLYCYSTIC OVARIAN DISEASE 05/24/2007   HIRSUTISM 05/24/2007   Arthropathy, multiple sites 05/24/2007   Past Medical History:  Diagnosis Date   Arthritis    Depression    Fibromyalgia    Headache(784.0)    Hx of adenomatous polyp of colon 02/14/2020   Hypertension    Immune deficiency disorder    Migraine headache    Shingles    Stroke (HCC)    Thrombocytopenia 10/01/2012   Past Surgical History:  Procedure  Laterality Date   ABDOMINAL HYSTERECTOMY     CARPAL TUNNEL RELEASE Left 09/25/2018   Procedure: LEFT CARPAL TUNNEL RELEASE;  Surgeon: Murrell Kuba, MD;  Location: [facility];  Service: Orthopedics;  Laterality: Left;   CESAREAN SECTION  2008   x1   PARTIAL HYSTERECTOMY  2012   due to fibroid   UPPER GASTROINTESTINAL ENDOSCOPY     WISDOM TOOTH EXTRACTION  1995   Social History   Tobacco Use   Smoking status: Never   Smokeless tobacco: Never  Vaping Use   Vaping status: Never Used  Substance Use Topics   Alcohol use: Yes    Comment: occasional   Drug use: Yes    Types: Marijuana    Comment: smokes small amount twice daily   Family History  Problem Relation Age of Onset   Healthy Mother    Diabetes Father    COPD Father    Breast cancer Paternal Aunt    Lung cancer Paternal Aunt    Lung cancer Paternal Uncle    Breast cancer Paternal  Grandmother    Colon cancer Paternal Grandmother    Rectal cancer Paternal Grandmother    Esophageal cancer Cousin    Stomach cancer Neg Hx    Allergies  Allergen Reactions   Trazodone And Nefazodone Nausea And Vomiting   Amoxicillin  Other (See Comments)    Yeast infection    Paxil  [Paroxetine ]     Vomiting    Trazodone Nausea And Vomiting   Carafate  [Sucralfate ] Nausea And Vomiting   Current Outpatient Medications on File Prior to Visit  Medication Sig Dispense Refill   ALPRAZolam  (XANAX ) 1 MG tablet Take 1 mg by mouth 4 (four) times daily as needed for anxiety or sleep.      amitriptyline  (ELAVIL ) 10 MG tablet TAKE ONE TABLET BY MOUTH EVERY MORNING 90 tablet 1   amitriptyline  (ELAVIL ) 25 MG tablet Take 2 tablets (50 mg total) by mouth daily. 60 tablet 0   amLODipine  (NORVASC ) 10 MG tablet Take 1 tablet (10 mg total) by mouth daily. 90 tablet 3   cloNIDine  (CATAPRES  - DOSED IN MG/24 HR) 0.2 mg/24hr patch APPLY 1 PATCH TOPICALLY TO SKIN  ONCE WEEKLY 12 patch 3   cloNIDine  (CATAPRES ) 0.1 MG tablet Take 1 tablet (0.1 mg  total) by mouth 2 (two) times daily as needed (for pressure >170). 60 tablet 4   cyclobenzaprine  (FLEXERIL ) 10 MG tablet Take 1 tablet (10 mg total) by mouth at bedtime as needed for muscle spasms. 30 tablet 3   famotidine  (PEPCID ) 20 MG tablet Take 1 tablet (20 mg total) by mouth 2 (two) times daily. 180 tablet 3   meclizine  (ANTIVERT ) 25 MG tablet Take 50 mg by mouth as needed for dizziness.     metoCLOPramide  (REGLAN ) 10 MG tablet TAKE ONE TABLET BY MOUTH EVERY 6 HOURS AS NEEDED FOR NAUSEA OR VOMITING (Patient taking differently: Take 10 mg by mouth every 6 (six) hours as needed for vomiting or nausea.) 60 tablet 0   ondansetron  (ZOFRAN ) 8 MG tablet TAKE ONE TABLET BY MOUTH THREE TIMES A DAY 20 tablet 3   oxybutynin (OXYTROL) 3.9 MG/24HR Place 1 patch onto the skin every 3 (three) days.     pantoprazole  (PROTONIX ) 40 MG tablet Take 1 tablet (40 mg total) by mouth daily. 90 tablet 3   valACYclovir  (VALTREX ) 1000 MG tablet Take 1 tablet (1,000 mg total) by mouth 3 (three) times daily. 21 tablet 0   No current facility-administered medications on file prior to visit.    Review of Systems  Constitutional:  Negative for activity change, appetite change, fatigue, fever and unexpected weight change.  HENT:  Negative for congestion, ear pain, rhinorrhea, sinus pressure and sore throat.   Eyes:  Negative for pain, redness and visual disturbance.  Respiratory:  Negative for cough, shortness of breath and wheezing.   Cardiovascular:  Negative for chest pain and palpitations.  Gastrointestinal:  Positive for abdominal distention, abdominal pain and diarrhea. Negative for anal bleeding, blood in stool, constipation, nausea, rectal pain and vomiting.  Endocrine: Negative for polydipsia and polyuria.  Genitourinary:  Negative for dysuria, frequency and urgency.  Musculoskeletal:  Negative for arthralgias, back pain and myalgias.  Skin:  Negative for pallor and rash.  Allergic/Immunologic: Negative for  environmental allergies.  Neurological:  Negative for dizziness, syncope and headaches.  Hematological:  Negative for adenopathy. Does not bruise/bleed easily.  Psychiatric/Behavioral:  Negative for decreased concentration and dysphoric mood. The patient is not nervous/anxious.        Objective:   Physical Exam Constitutional:  General: She is not in acute distress.    Appearance: Normal appearance. She is well-developed. She is obese. She is not ill-appearing or diaphoretic.  HENT:     Head: Normocephalic and atraumatic.  Eyes:     General: No scleral icterus.    Conjunctiva/sclera: Conjunctivae normal.     Pupils: Pupils are equal, round, and reactive to light.  Cardiovascular:     Rate and Rhythm: Normal rate and regular rhythm.     Heart sounds: Normal heart sounds.  Pulmonary:     Effort: Pulmonary effort is normal. No respiratory distress.     Breath sounds: Normal breath sounds. No wheezing or rales.  Abdominal:     General: Abdomen is protuberant. Bowel sounds are normal. There is no distension.     Palpations: Abdomen is soft. There is no shifting dullness, hepatomegaly, splenomegaly, mass or pulsatile mass.     Tenderness: There is no abdominal tenderness. There is no right CVA tenderness, left CVA tenderness, guarding or rebound. Negative signs include Murphy's sign and McBurney's sign.  Musculoskeletal:     Cervical back: Normal range of motion and neck supple.  Lymphadenopathy:     Cervical: No cervical adenopathy.  Skin:    General: Skin is warm and dry.     Coloration: Skin is not pale.     Findings: No erythema.  Neurological:     Mental Status: She is alert.           Assessment & Plan:   Problem List Items Addressed This Visit       Digestive   Non-intractable cyclical vomiting (Chronic)   This has been better lately Continues amitriptyline        IBS (irritable bowel syndrome) - Primary   Episodic and recently worsened  Diarrhea  predominant  Episodes cause moderate to severe cramping (bad enough to lie on bathroom floor) with urgent loose stools Triggers may include Dairy  Rich/fatty foods Red meat  Garlic  Has tried FODMAP eating- helpful  Of note-more wheat /whole wheat foods recently (? If gluten intol)   Reviewed EGD and colonoscopy from 2021 Also CT abd/pel from 2021   Today Prescription hyoscamine SL to try during episode for cramping Discussed produce choices/fiber and fluid intake  Continue to avoid known triggers-dairy / beef/fatty or heavy foods   Consider trying a week of no /less gluten Consider trying a week of FODMAP  Keep food journal Then update us    Call back and Er precautions noted in detail today        Relevant Medications   hyoscyamine  (LEVSIN  SL) 0.125 MG SL tablet   GERD (gastroesophageal reflux disease)   Pantoprazole  40 mg daily  Pepcid  20 mg bid Has been ok       Relevant Medications   hyoscyamine  (LEVSIN  SL) 0.125 MG SL tablet

## 2024-07-29 NOTE — Assessment & Plan Note (Signed)
 This has been better lately Continues amitriptyline 

## 2024-07-29 NOTE — Patient Instructions (Addendum)
 If you get an episode of cramping  Try the hyoscamine under the tongue   Eat lots of fiber from produce as tolerated   Continue avoiding dairy (or use lact aid if it help)  Try a week with more gluten free food (less wheat)   Try the [facility] options when you are in a flare   Keep up fluids   Update us  in a few weeks with how you are doing

## 2024-07-29 NOTE — Assessment & Plan Note (Signed)
 Lab Results  Component Value Date   WBC 11.0 (H) 01/16/2024   HGB 14.2 01/16/2024   HCT 41.5 01/16/2024   MCV 97.2 01/16/2024   PLT 125 (L) 01/16/2024   No new bleeding /bruising

## 2024-07-29 NOTE — Assessment & Plan Note (Signed)
 Pantoprazole  40 mg daily  Pepcid  20 mg bid Has been ok

## 2024-07-30 ENCOUNTER — Encounter: Payer: Self-pay | Admitting: Family Medicine

## 2024-08-08 ENCOUNTER — Encounter: Payer: Self-pay | Admitting: Family Medicine

## 2024-08-12 ENCOUNTER — Ambulatory Visit
Admission: RE | Admit: 2024-08-12 | Discharge: 2024-08-12 | Disposition: A | Discharge status: Home / Self Care | Source: Ambulatory Visit | Attending: Obstetrics and Gynecology | Admitting: Obstetrics and Gynecology

## 2024-08-12 DIAGNOSIS — N6320 Unspecified lump in the left breast, unspecified quadrant: Secondary | ICD-10-CM

## 2024-08-17 ENCOUNTER — Encounter: Payer: Self-pay | Admitting: Family Medicine

## 2024-08-22 ENCOUNTER — Telehealth: Payer: Self-pay | Admitting: *Deleted

## 2024-08-22 ENCOUNTER — Ambulatory Visit: Payer: 59

## 2024-08-22 VITALS — BP 131/80 | Ht 62.0 in | Wt 180.0 lb

## 2024-08-22 DIAGNOSIS — T7819XS Other adverse food reactions, not elsewhere classified, sequela: Secondary | ICD-10-CM

## 2024-08-22 DIAGNOSIS — Z0000 Encounter for general adult medical examination without abnormal findings: Secondary | ICD-10-CM | POA: Diagnosis not present

## 2024-08-22 DIAGNOSIS — R110 Nausea: Secondary | ICD-10-CM

## 2024-08-22 NOTE — Telephone Encounter (Signed)
 Pt sent a response to PCP's mychart message saying:  No.. I have not taken the food allergy test at all.

## 2024-08-22 NOTE — Progress Notes (Signed)
 Because this visit was a virtual/telehealth visit,  certain criteria was not obtained, such a blood pressure, CBG if applicable, and timed get up and go. Any medications not marked as taking were not mentioned during the medication reconciliation part of the visit. Any vitals not documented were not able to be obtained due to this being a telehealth visit or patient was unable to self-report a recent blood pressure reading due to a lack of equipment at home via telehealth. Vitals that have been documented are verbally provided by the patient.   This visit was performed by a medical professional under my direct supervision. I was immediately available for consultation/collaboration. I have reviewed and agree with the Annual Wellness Visit documentation.  Subjective:   Dana Greer is a 53 y.o. who presents for a Medicare Wellness preventive visit.  As a reminder, Annual Wellness Visits Dana't include a physical exam, and some assessments may be limited, especially if this visit is performed virtually. We may recommend an in-person follow-up visit with your provider if needed.  Visit Complete: Virtual I connected with  Dana Greer on 08/22/24 by a audio enabled telemedicine application and verified that I am speaking with the correct person using two identifiers.  Patient Location: Home  Provider Location: Home Office  I discussed the limitations of evaluation and management by telemedicine. The patient expressed understanding and agreed to proceed.  Vital Signs: Because this visit was a virtual/telehealth visit, some criteria may be missing or patient reported. Any vitals not documented were not able to be obtained and vitals that have been documented are patient reported.  VideoDeclined- This patient declined Librarian, academic. Therefore the visit was completed with audio only.  Persons Participating in Visit: Patient.  AWV Questionnaire: Yes:  Patient Medicare AWV questionnaire was completed by the patient on 08/22/2024; I have confirmed that all information answered by patient is correct and no changes since this date.  Cardiac Risk Factors include: advanced age (>73men, >46 women);obesity (BMI >30kg/m2);smoking/ tobacco exposure;hypertension     Objective:    Today's Vitals   08/22/24 1527  BP: 131/80  Weight: 180 lb (81.6 kg)  Height: 5' 2 (1.575 m)   Body mass index is 32.92 kg/m.     08/22/2024    3:26 PM 01/16/2024   10:19 AM 08/22/2023    2:50 PM 05/24/2023    8:48 PM 03/24/2023    5:14 PM 10/05/2022    2:23 PM 10/04/2022    9:51 PM  Advanced Directives  Does Patient Have a Medical Advance Directive? No No No No No No No  Would patient like information on creating a medical advance directive? No - Patient declined No - Patient declined   No - Patient declined No - Patient declined No - Patient declined    Current Medications (verified) Outpatient Encounter Medications as of 08/22/2024  Medication Sig   ALPRAZolam  (XANAX ) 1 MG tablet Take 1 mg by mouth 4 (four) times daily as needed for anxiety or sleep.    amitriptyline  (ELAVIL ) 10 MG tablet TAKE ONE TABLET BY MOUTH EVERY MORNING   amitriptyline  (ELAVIL ) 25 MG tablet Take 2 tablets (50 mg total) by mouth daily.   amLODipine  (NORVASC ) 10 MG tablet Take 1 tablet (10 mg total) by mouth daily.   cloNIDine  (CATAPRES  - DOSED IN MG/24 HR) 0.2 mg/24hr patch APPLY 1 PATCH TOPICALLY TO SKIN  ONCE WEEKLY   cloNIDine  (CATAPRES ) 0.1 MG tablet Take 1 tablet (0.1 mg total) by mouth  2 (two) times daily as needed (for pressure >170).   cyclobenzaprine  (FLEXERIL ) 10 MG tablet Take 1 tablet (10 mg total) by mouth at bedtime as needed for muscle spasms.   famotidine  (PEPCID ) 20 MG tablet Take 1 tablet (20 mg total) by mouth 2 (two) times daily.   hyoscyamine  (LEVSIN  SL) 0.125 MG SL tablet Place 1 tablet (0.125 mg total) under the tongue every 4 (four) hours as needed for cramping.    meclizine  (ANTIVERT ) 25 MG tablet Take 50 mg by mouth as needed for dizziness.   metoCLOPramide  (REGLAN ) 10 MG tablet TAKE ONE TABLET BY MOUTH EVERY 6 HOURS AS NEEDED FOR NAUSEA OR VOMITING (Patient taking differently: Take 10 mg by mouth every 6 (six) hours as needed for vomiting or nausea.)   ondansetron  (ZOFRAN ) 8 MG tablet TAKE ONE TABLET BY MOUTH THREE TIMES A DAY   oxybutynin (OXYTROL) 3.9 MG/24HR Place 1 patch onto the skin every 3 (three) days.   pantoprazole  (PROTONIX ) 40 MG tablet Take 1 tablet (40 mg total) by mouth daily.   valACYclovir  (VALTREX ) 1000 MG tablet Take 1 tablet (1,000 mg total) by mouth 3 (three) times daily.   No facility-administered encounter medications on file as of 08/22/2024.    Allergies (verified) Trazodone and nefazodone, Amoxicillin , Paxil  [paroxetine ], Trazodone, and Carafate  [sucralfate ]   History: Past Medical History:  Diagnosis Date   Arthritis    Depression    Fibromyalgia    Headache(784.0)    Hx of adenomatous polyp of colon 02/14/2020   Hypertension    Immune deficiency disorder    Migraine headache    Shingles    Stroke (HCC)    Thrombocytopenia 10/01/2012   Past Surgical History:  Procedure Laterality Date   ABDOMINAL HYSTERECTOMY     CARPAL TUNNEL RELEASE Left 09/25/2018   Procedure: LEFT CARPAL TUNNEL RELEASE;  Surgeon: Murrell Kuba, MD;  Location: [facility];  Service: Orthopedics;  Laterality: Left;   CESAREAN SECTION  2008   x1   PARTIAL HYSTERECTOMY  2012   due to fibroid   UPPER GASTROINTESTINAL ENDOSCOPY     WISDOM TOOTH EXTRACTION  1995   Family History  Problem Relation Age of Onset   Healthy Mother    Diabetes Father    COPD Father    Breast cancer Paternal Aunt    Lung cancer Paternal Aunt    Lung cancer Paternal Uncle    Breast cancer Paternal Grandmother    Colon cancer Paternal Grandmother    Rectal cancer Paternal Grandmother    Esophageal cancer Cousin    Stomach cancer Neg Hx     Social History   Socioeconomic History   Marital status: Legally Separated    Spouse name: Not on file   Number of children: 3   Years of education: college   Highest education level: Some college, no degree  Occupational History   Occupation: Unemployed  Tobacco Use   Smoking status: Never   Smokeless tobacco: Never  Vaping Use   Vaping status: Never Used  Substance and Sexual Activity   Alcohol use: Yes    Comment: occasional   Drug use: Yes    Types: Marijuana    Comment: smokes small amount twice daily   Sexual activity: Not Currently    Birth control/protection: None  Other Topics Concern   Not on file  Social History Narrative   Lives at home with her children.   6 cups caffeine  per day.   Right-handed.   Social  Drivers of Health   Financial Resource Strain: High Risk (08/22/2024)   Overall Financial Resource Strain (CARDIA)    Difficulty of Paying Living Expenses: Very hard  Food Insecurity: Food Insecurity Present (08/22/2024)   Hunger Vital Sign    Worried About Running Out of Food in the Last Year: Often true    Ran Out of Food in the Last Year: Often true  Transportation Needs: No Transportation Needs (08/22/2024)   PRAPARE - Administrator, Civil Service (Medical): No    Lack of Transportation (Non-Medical): No  Physical Activity: Insufficiently Active (08/22/2024)   Exercise Vital Sign    Days of Exercise per Week: 7 days    Minutes of Exercise per Session: 20 min  Stress: Stress Concern Present (08/22/2024)   [employer] of Occupational Health - Occupational Stress Questionnaire    Feeling of Stress: Rather much  Social Connections: Moderately Isolated (08/22/2024)   Social Connection and Isolation Panel    Frequency of Communication with Friends and Family: More than three times a week    Frequency of Social Gatherings with Friends and Family: Once a week    Attends Religious Services: More than 4 times per year    Active  Member of [employer] or Organizations: No    Attends Engineer, structural: Not on file    Marital Status: Separated    Tobacco Counseling Counseling given: Not Answered    Clinical Intake:  Pre-visit preparation completed: Yes  Pain : No/denies pain     BMI - recorded: 32.92 Nutritional Status: BMI > 30  Obese Nutritional Risks: Other (Comment) Diabetes: No  Lab Results  Component Value Date   HGBA1C 5.7 06/07/2023   HGBA1C 6.0 06/20/2022   HGBA1C 5.6 05/15/2020     How often do you need to have someone help you when you read instructions, pamphlets, or other written materials from your doctor or pharmacy?: 1 - Never  Interpreter Needed?: No  Information entered by :: Twilla Khouri,CMA   Activities of Daily Living     08/22/2024   11:00 AM  In your present state of health, do you have any difficulty performing the following activities:  Hearing? 0  Vision? 0  Difficulty concentrating or making decisions? 0  Walking or climbing stairs? 1  Dressing or bathing? 0  Doing errands, shopping? 0  Preparing Food and eating ? N  Using the Toilet? N  In the past six months, have you accidently leaked urine? Y  Do you have problems with loss of bowel control? N  Managing your Medications? N  Managing your Finances? N  Housekeeping or managing your Housekeeping? N    Patient Care Team: [name], [name], MD as PCP - General (Family Medicine) End, Lonni, MD as PCP - Cardiology (Cardiology) Onita Duos, MD as Attending Physician (Neurology)  I have updated your Care Teams any recent Medical Services you may have received from other providers in the past year.     Assessment:   This is a routine wellness examination for Heena.  Hearing/Vision screen Hearing Screening - Comments:: No difficulties hearing Vision Screening - Comments:: Patient wears glasses and contacts   Goals Addressed             This Visit's Progress    Increase physical  activity   On track    Starting 04/10/2019, I will continue to exercise for 30 minutes daily.        Depression Screen  08/22/2024    3:31 PM 07/29/2024   10:26 AM 03/08/2024   11:32 AM 09/25/2023   12:12 PM 08/22/2023    2:47 PM 06/07/2023   10:05 AM 08/15/2022    1:14 PM  PHQ 2/9 Scores  PHQ - 2 Score 2 4 3 2 3 4 2   PHQ- 9 Score 7 13 13 11 9 19 8     Fall Risk     08/22/2024   11:00 AM 07/29/2024   10:26 AM 03/08/2024   11:32 AM 09/25/2023   12:12 PM 08/22/2023    2:42 PM  Fall Risk   Falls in the past year? 0 0 0 0 0  Number falls in past yr: 0 0 0 0 0  Injury with Fall? 0 0 0 0 0  Risk for fall due to :  No Fall Risks No Fall Risks No Fall Risks No Fall Risks  Follow up  Falls evaluation completed Falls evaluation completed Falls evaluation completed;Education provided Falls prevention discussed;Education provided    MEDICARE RISK AT HOME:  Medicare Risk at Home Any stairs in or around the home?: (Patient-Rptd) Yes If so, are there any without handrails?: (Patient-Rptd) No Home free of loose throw rugs in walkways, pet beds, electrical cords, etc?: (Patient-Rptd) Yes Adequate lighting in your home to reduce risk of falls?: (Patient-Rptd) Yes Life alert?: (Patient-Rptd) No Use of a cane, walker or w/c?: (Patient-Rptd) Yes Grab bars in the bathroom?: (Patient-Rptd) Yes Shower chair or bench in shower?: (Patient-Rptd) Yes Elevated toilet seat or a handicapped toilet?: (Patient-Rptd) Yes  TIMED UP AND GO:  Was the test performed?  no  Cognitive Function: 6CIT completed    04/10/2019    9:05 AM 03/21/2018    9:45 AM 10/21/2016    9:35 AM  MMSE - Mini Mental State Exam  Orientation to time 5 5 5    Orientation to Place 5 5 5    Registration 3 3 3    Attention/ Calculation 0 0 0   Recall 3 3 3    Language- name 2 objects 0 0 0   Language- repeat 1 1 1   Language- follow 3 step command 0 3 3   Language- read & follow direction 0 0 0   Write a sentence 0 0 0   Copy  design 0 0 0   Total score 17 20 20       Data saved with a previous flowsheet row definition        08/22/2024    3:32 PM 08/22/2023    2:53 PM 08/15/2022    1:09 PM 07/25/2021    9:17 AM  6CIT Screen  What Year? 0 points 0 points 0 points 0 points  What month? 0 points 0 points 0 points 0 points  What time? 0 points 0 points 0 points 0 points  Count back from 20 0 points 0 points 0 points 0 points  Months in reverse 0 points 0 points 0 points 0 points  Repeat phrase 0 points 0 points 0 points   Total Score 0 points 0 points 0 points     Immunizations Immunization History  Administered Date(s) Administered   Influenza, Seasonal, Injecte, Preservative Fre 07/29/2024   Influenza,inj,Quad PF,6+ Mos 11/25/2015, 10/25/2016, 01/09/2018   PFIZER(Purple Top)SARS-COV-2 Vaccination 05/27/2020, 06/17/2020   Tdap 06/21/2024    Screening Tests Health Maintenance  Topic Date Due   Hepatitis C Screening  Never done   Pneumococcal Vaccine: 50+ Years (1 of 2 - PCV) Never done  Hepatitis B Vaccines 19-59 Average Risk (1 of 3 - 19+ 3-dose series) Never done   Zoster Vaccines- Shingrix (1 of 2) Never done   COVID-19 Vaccine (3 - 2025-26 season) 07/01/2024   Mammogram  08/12/2025   Medicare Annual Wellness (AWV)  08/22/2025   Colonoscopy  02/10/2027   DTaP/Tdap/Td (2 - Td or Tdap) 06/21/2034   Influenza Vaccine  Completed   HIV Screening  Completed   HPV VACCINES  Aged Out   Meningococcal B Vaccine  Aged Out    Health Maintenance Items Addressed:patient declined  Additional Screening:  Vision Screening: Recommended annual ophthalmology exams for early detection of glaucoma and other disorders of the eye. Is the patient up to date with their annual eye exam?  No    Dental Screening: Recommended annual dental exams for proper oral hygiene  Community Resource Referral / Chronic Care Management: CRR required this visit?  No   CCM required this visit?  No   Plan:    I have  personally reviewed and noted the following in the patient's chart:   Medical and social history Use of alcohol, tobacco or illicit drugs  Current medications and supplements including opioid prescriptions. Patient is not currently taking opioid prescriptions. Functional ability and status Nutritional status Physical activity Advanced directives List of other physicians Hospitalizations, surgeries, and ER visits in previous 12 months Vitals Screenings to include cognitive, depression, and falls Referrals and appointments  In addition, I have reviewed and discussed with patient certain preventive protocols, quality metrics, and best practice recommendations. A written personalized care plan for preventive services as well as general preventive health recommendations were provided to patient.   Lyle MARLA Right, [place]   08/22/2024   After Visit Summary: (MyChart) Due to this being a telephonic visit, the after visit summary with patients personalized plan was offered to patient via MyChart   Notes: Nothing significant to report at this time.

## 2024-08-22 NOTE — Telephone Encounter (Signed)
Sent mychart asking pt

## 2024-08-22 NOTE — Telephone Encounter (Signed)
 Would you be open to a referral to an allergist to discus this?

## 2024-08-22 NOTE — Patient Instructions (Signed)
 Dana Greer,  Thank you for taking the time for your Medicare Wellness Visit. I appreciate your continued commitment to your health goals. Please review the care plan we discussed, and feel free to reach out if I can assist you further.  Medicare recommends these wellness visits once per year to help you and your care team stay ahead of potential health issues. These visits are designed to focus on prevention, allowing your provider to concentrate on managing your acute and chronic conditions during your regular appointments.  Please note that Annual Wellness Visits do not include a physical exam. Some assessments may be limited, especially if the visit was conducted virtually. If needed, we may recommend a separate in-person follow-up with your provider.  Ongoing Care Seeing your primary care provider every 3 to 6 months helps us  monitor your health and provide consistent, personalized care.   Referrals If a referral was made during today's visit and you haven't received any updates within two weeks, please contact the referred provider directly to check on the status.  Recommended Screenings:  Health Maintenance  Topic Date Due   Hepatitis C Screening  Never done   Pneumococcal Vaccine for age over 22 (1 of 2 - PCV) Never done   Hepatitis B Vaccine (1 of 3 - 19+ 3-dose series) Never done   Zoster (Shingles) Vaccine (1 of 2) Never done   COVID-19 Vaccine (3 - 2025-26 season) 07/01/2024   Medicare Annual Wellness Visit  08/21/2024   Breast Cancer Screening  08/12/2025   Colon Cancer Screening  02/10/2027   DTaP/Tdap/Td vaccine (2 - Td or Tdap) 06/21/2034   Flu Shot  Completed   HIV Screening  Completed   HPV Vaccine  Aged Out   Meningitis B Vaccine  Aged Out       08/22/2024    3:26 PM  Advanced Directives  Does Patient Have a Medical Advance Directive? No  Would patient like information on creating a medical advance directive? No - Patient declined   Advance Care Planning  is important because it: Ensures you receive medical care that aligns with your values, goals, and preferences. Provides guidance to your family and loved ones, reducing the emotional burden of decision-making during critical moments.  Vision: Annual vision screenings are recommended for early detection of glaucoma, cataracts, and diabetic retinopathy. These exams can also reveal signs of chronic conditions such as diabetes and high blood pressure.  Dental: Annual dental screenings help detect early signs of oral cancer, gum disease, and other conditions linked to overall health, including heart disease and diabetes.  Please see the attached documents for additional preventive care recommendations.

## 2024-08-23 DIAGNOSIS — T7819XA Other adverse food reactions, not elsewhere classified, initial encounter: Secondary | ICD-10-CM | POA: Insufficient documentation

## 2024-08-23 NOTE — Telephone Encounter (Signed)
 Sent mychart letting pt know  ?

## 2024-08-23 NOTE — Telephone Encounter (Signed)
 Pt responded saying:  Sure. I did see an allergist before, one time abt the mango. I would not like to go back to her. It was very difficult keeping an appointment as she cancelled every appointment, rescheduled and cancelled again. If possible can we go with someone else. Thanks

## 2024-08-23 NOTE — Telephone Encounter (Signed)
 I put the referral in for an allergist  Please let us  know if you don't hear in 1-2 weeks to set that up (mychart message or call or letter)  Please let them know who you saw before and would like another provider (I could not find name in chart)  Thanks

## 2024-08-28 ENCOUNTER — Other Ambulatory Visit: Payer: Self-pay | Admitting: *Deleted

## 2024-08-28 DIAGNOSIS — K219 Gastro-esophageal reflux disease without esophagitis: Secondary | ICD-10-CM

## 2024-08-28 NOTE — Telephone Encounter (Signed)
 Received fax requesting Rxs be sent to [facility]  Also asked for dicyclomine  20 mg (1 tab TID prn for spasms) was requested but not on med list.  Last OV was on 07/29/24

## 2024-08-28 NOTE — Telephone Encounter (Signed)
 Hyoscamine is on med list   It is in same family as dicyclomine   Can take one or the other but not both  Confirm which she prefers before I send  Thanks

## 2024-08-30 ENCOUNTER — Telehealth: Payer: Self-pay

## 2024-08-30 MED ORDER — PANTOPRAZOLE SODIUM 40 MG PO TBEC: 40.0000 mg | DELAYED_RELEASE_TABLET | Freq: Every day | ORAL | 2 refills | Status: AC

## 2024-08-30 NOTE — Telephone Encounter (Signed)
 Spoke to patient and she informed me dicyclomine  makes her sick and she does not use this medication anymore because of that.

## 2024-08-30 NOTE — Telephone Encounter (Signed)
 Thanks

## 2024-09-02 ENCOUNTER — Encounter: Payer: Self-pay | Admitting: Radiology

## 2024-09-18 ENCOUNTER — Encounter: Payer: Self-pay | Admitting: Family Medicine

## 2024-09-18 MED ORDER — ONDANSETRON HCL 8 MG PO TABS
8.0000 mg | ORAL_TABLET | Freq: Three times a day (TID) | ORAL | 3 refills | Status: AC
Start: 2024-09-18 — End: ?

## 2024-10-02 ENCOUNTER — Encounter: Payer: Self-pay | Admitting: Family Medicine

## 2024-10-04 ENCOUNTER — Encounter: Payer: Self-pay | Admitting: Family Medicine

## 2024-10-09 ENCOUNTER — Telehealth: Payer: Self-pay | Admitting: *Deleted

## 2024-10-09 ENCOUNTER — Telehealth: Payer: Self-pay | Admitting: Internal Medicine

## 2024-10-09 NOTE — Telephone Encounter (Signed)
 Copied from CRM #[id number]. Topic: General - Other >> Oct 09, 2024  3:43 PM Rosina BIRCH wrote: Reason for CRM: Erminio from the [facility] called stating the patient has an appointment on 1/29 for her right carpel tunnel relief. The patient stated that she had two strokes and a heart attack, so the hand center need to confirm these things and if she has a neurology that she sees from the stroke. Patient was not clear about her medical history and she may need to change over to the main hospital for surgery. Erminio sent a cardiac clearance over to her cardiologist and her BP today was 144/107. Erminio  stated the patient need to have good control of her BP or they will cancel the surgery [phone]

## 2024-10-09 NOTE — Telephone Encounter (Signed)
° °  Pre-operative Risk Assessment    Patient Name: Dana Greer  DOB: [birth year] MRN: [id number]      Request for Surgical Clearance    Procedure:  Right  Carpal Tunnel Release  Date of Surgery:  Clearance 11/28/24                                 Surgeon:  Dr Franky Curia Surgeon's Group or Practice Name:  [facility] Phone number:  [phone]  Fax number:   [phone]   Type of Clearance Requested:   - Medical    Type of Anesthesia:  IV Regional   Additional requests/questions:  Pt has said she has had a Stroke and MI. Please advise  Signed, Rojelio Kays   10/09/2024, 3:29 PM

## 2024-10-09 NOTE — Telephone Encounter (Signed)
 Please schedule a visit  Her blood pressure tends to be labile and come down on 2nd check  We can talk about the rest  Also looks like she is going to touch base with cardiology   Thanks

## 2024-10-09 NOTE — Telephone Encounter (Signed)
 Left message for patient to call our office and ask for the preop team to scheduled TELE preop appt.

## 2024-10-09 NOTE — Telephone Encounter (Signed)
° °  Name: Dana Greer  DOB: [birth year]  MRN: [id number]  Primary Cardiologist: Lonni Hanson, MD   Preoperative team, please contact this patient and set up a phone call appointment for further preoperative risk assessment. Please obtain consent and complete medication review. Thank you for your help.  I also confirmed the patient resides in the state of [place] . As per [facility] Medical Board telemedicine laws, the patient must reside in the state in which the provider is licensed.   Rollo FABIENE Louder, PA-C 10/09/2024, 3:39 PM [facility]

## 2024-10-10 ENCOUNTER — Encounter: Payer: Self-pay | Admitting: Family Medicine

## 2024-10-10 ENCOUNTER — Ambulatory Visit: Admitting: Family Medicine

## 2024-10-10 ENCOUNTER — Other Ambulatory Visit: Payer: Self-pay | Admitting: Orthopedic Surgery

## 2024-10-10 ENCOUNTER — Ambulatory Visit: Payer: Self-pay | Admitting: Family Medicine

## 2024-10-10 ENCOUNTER — Telehealth: Payer: Self-pay

## 2024-10-10 VITALS — BP 133/82 | HR 70 | Temp 98.9°F | Ht 62.0 in | Wt 184.1 lb

## 2024-10-10 DIAGNOSIS — M79602 Pain in left arm: Secondary | ICD-10-CM

## 2024-10-10 DIAGNOSIS — M7918 Myalgia, other site: Secondary | ICD-10-CM

## 2024-10-10 DIAGNOSIS — I679 Cerebrovascular disease, unspecified: Secondary | ICD-10-CM | POA: Insufficient documentation

## 2024-10-10 DIAGNOSIS — E559 Vitamin D deficiency, unspecified: Secondary | ICD-10-CM | POA: Diagnosis not present

## 2024-10-10 DIAGNOSIS — I10 Essential (primary) hypertension: Secondary | ICD-10-CM

## 2024-10-10 DIAGNOSIS — G8929 Other chronic pain: Secondary | ICD-10-CM | POA: Diagnosis not present

## 2024-10-10 DIAGNOSIS — M79601 Pain in right arm: Secondary | ICD-10-CM | POA: Diagnosis not present

## 2024-10-10 LAB — CBC WITH DIFFERENTIAL/PLATELET
Basophils Absolute: 0 K/uL (ref 0.0–0.1)
Basophils Relative: 0.2 % (ref 0.0–3.0)
Eosinophils Absolute: 0.1 K/uL (ref 0.0–0.7)
Eosinophils Relative: 2.7 % (ref 0.0–5.0)
HCT: 37.8 % (ref 36.0–46.0)
Hemoglobin: 12.9 g/dL (ref 12.0–15.0)
Lymphocytes Relative: 49.4 % — ABNORMAL HIGH (ref 12.0–46.0)
Lymphs Abs: 2.3 K/uL (ref 0.7–4.0)
MCHC: 34.1 g/dL (ref 30.0–36.0)
MCV: 97.8 fl (ref 78.0–100.0)
Monocytes Absolute: 0.3 K/uL (ref 0.1–1.0)
Monocytes Relative: 6.7 % (ref 3.0–12.0)
Neutro Abs: 1.9 K/uL (ref 1.4–7.7)
Neutrophils Relative %: 41 % — ABNORMAL LOW (ref 43.0–77.0)
Platelets: 141 K/uL — ABNORMAL LOW (ref 150.0–400.0)
RBC: 3.87 Mil/uL (ref 3.87–5.11)
RDW: 14.1 % (ref 11.5–15.5)
WBC: 4.6 K/uL (ref 4.0–10.5)

## 2024-10-10 LAB — VITAMIN D 25 HYDROXY (VIT D DEFICIENCY, FRACTURES): VITD: 12.79 ng/mL — ABNORMAL LOW (ref 30.00–100.00)

## 2024-10-10 LAB — SEDIMENTATION RATE: Sed Rate: 10 mm/h (ref 0–30)

## 2024-10-10 NOTE — Telephone Encounter (Signed)
°  Patient Consent for Virtual Visit        Dana Greer has provided verbal consent on 10/10/2024 for a virtual visit (video or telephone).   CONSENT FOR VIRTUAL VISIT FOR:  Dana Greer  By participating in this virtual visit I agree to the following:  I hereby voluntarily request, consent and authorize [facility] and its employed or contracted physicians, physician assistants, nurse practitioners or other licensed health care professionals (the Practitioner), to provide me with telemedicine health care services (the Services) as deemed necessary by the treating Practitioner. I acknowledge and consent to receive the Services by the Practitioner via telemedicine. I understand that the telemedicine visit will involve communicating with the Practitioner through live audiovisual communication technology and the disclosure of certain medical information by electronic transmission. I acknowledge that I have been given the opportunity to request an in-person assessment or other available alternative prior to the telemedicine visit and am voluntarily participating in the telemedicine visit.  I understand that I have the right to withhold or withdraw my consent to the use of telemedicine in the course of my care at any time, without affecting my right to future care or treatment, and that the Practitioner or I may terminate the telemedicine visit at any time. I understand that I have the right to inspect all information obtained and/or recorded in the course of the telemedicine visit and may receive copies of available information for a reasonable fee.  I understand that some of the potential risks of receiving the Services via telemedicine include:  Delay or interruption in medical evaluation due to technological equipment failure or disruption; Information transmitted may not be sufficient (e.g. poor resolution of images) to allow for appropriate medical decision making by the  Practitioner; and/or  In rare instances, security protocols could fail, causing a breach of personal health information.  Furthermore, I acknowledge that it is my responsibility to provide information about my medical history, conditions and care that is complete and accurate to the best of my ability. I acknowledge that Practitioner's advice, recommendations, and/or decision may be based on factors not within their control, such as incomplete or inaccurate data provided by me or distortions of diagnostic images or specimens that may result from electronic transmissions. I understand that the practice of medicine is not an exact science and that Practitioner makes no warranties or guarantees regarding treatment outcomes. I acknowledge that a copy of this consent can be made available to me via my patient portal [facility] MyChart), or I can request a printed copy by calling the office of [facility].    I understand that my insurance will be billed for this visit.   I have read or had this consent read to me. I understand the contents of this consent, which adequately explains the benefits and risks of the Services being provided via telemedicine.  I have been provided ample opportunity to ask questions regarding this consent and the Services and have had my questions answered to my satisfaction. I give my informed consent for the services to be provided through the use of telemedicine in my medical care

## 2024-10-10 NOTE — Assessment & Plan Note (Signed)
 This was noted on MRI brain in 2013 in the right thalamus (appeared old) No acute stroke known  Recent CT head in 12/2023 normal   Working to control blood pressure and cholesterol

## 2024-10-10 NOTE — Progress Notes (Signed)
 Subjective:    Patient ID: Dana Greer, female    DOB: 03-22-1971, 53 y.o.   MRN: [id number]  HPI  Wt Readings from Last 3 Encounters:  10/10/24 184 lb 2 oz (83.5 kg)  08/22/24 180 lb (81.6 kg)  07/29/24 184 lb 6 oz (83.6 kg)   33.68 kg/m  Vitals:   10/10/24 1030 10/10/24 1103  BP: (!) 142/90 133/82  Pulse: 70   Temp: 98.9 F (37.2 C)   SpO2: 99%    Pt presents for  Uri symptoms  Arm soreness HTN  Upcoming hand surgery   (? If outpt or need to schedule in hosp)- Pain/chronic      HTN Blood pressure was high in the hand surgeon office 144/107   bp is stable today  No cp or palpitations or headaches or edema  No side effects to medicines  BP Readings from Last 3 Encounters:  10/10/24 133/82  08/22/24 131/80  07/29/24 131/80     Lab Results  Component Value Date   NA 136 01/16/2024   K 3.3 (L) 01/16/2024   CO2 26 01/16/2024   GLUCOSE 138 (H) 01/16/2024   BUN 9 01/16/2024   CREATININE 0.83 01/16/2024   CALCIUM 9.9 01/16/2024   GFR 75.72 11/03/2022   GFRNONAA >60 01/16/2024   Clonidine  patch 0.2 mg (per pt not on this now)   Oral clonidine  0.1 mg bid   Amlodipine  10 mg daily (stopped, it made her swell)     Upcoming surgery for hand  Some concern re : past stroke  Also cardiac history  Also HTN  Blood pressure was up in the ortho office    Cold symptoms (2nd week)  Getting a lot out /mucous is clear (was colored)  Little cough  No wheeze or trouble breathing  Facial pain is better also   Did not do a covid test  No fever  Avoids sudafed    Chronic myofascial pain    Last CT head No intercranial pathology 12/2023   MRI brain in 2013 noted small vessel infarction in right thalamus/looked old  Small vessel   Still does suffer with migraines    Has aching in arms  They feel heavy and sore for few weeks  Occational tingling  Thinks stress is playing a role      Lab Results  Component Value Date   WBC 4.6  10/10/2024   HGB 12.9 10/10/2024   HCT 37.8 10/10/2024   MCV 97.8 10/10/2024   PLT 141.0 (L) 10/10/2024   Lab Results  Component Value Date   ALT 9 01/16/2024   AST 17 01/16/2024   ALKPHOS 54 01/16/2024   BILITOT 0.7 01/16/2024   Lab Results  Component Value Date   CHOL 121 06/07/2023   HDL 45.80 06/07/2023   LDLCALC 62 06/07/2023   TRIG 67.0 06/07/2023   CHOLHDL 3 06/07/2023   Lab Results  Component Value Date   TSH 0.71 06/07/2023   Lab Results  Component Value Date   VITAMINB12 284 06/07/2023   Lab Results  Component Value Date   HGBA1C 5.7 06/07/2023   HGBA1C 6.0 06/20/2022   HGBA1C 5.6 05/15/2020   Last vitamin D  Lab Results  Component Value Date   VD25OH 12.79 (L) 10/10/2024   Lab Results  Component Value Date   ESRSEDRATE 10 10/10/2024     [facility]  02/15/24  Did tsh-normal  Cmp  Cbc  Lipid was normal   A1c  normal   Normal pap     Patient Active Problem List   Diagnosis Date Noted   Bilateral arm pain 10/10/2024   Small vessel disease, cerebrovascular 10/10/2024   Adverse food reaction 08/23/2024   GERD (gastroesophageal reflux disease) 07/29/2024   IBS (irritable bowel syndrome) 07/29/2024   Pre-syncope 07/27/2023   Chronic night sweats 07/27/2023   Skin pain 01/08/2023   Ovarian cyst 11/05/2022   Elevated troponin 10/05/2022   Thickening of wall of gallbladder 10/05/2022   Anxiety 10/05/2022   Scoliosis 09/07/2022   Microscopic hematuria 07/05/2022   Generalized weakness 05/05/2022   Obesity (BMI 30-39.9)    Rash and nonspecific skin eruption 11/15/2021   Perioral dermatitis 11/02/2021   Mixed incontinence 08/16/2021   Elevated serum creatinine 08/12/2021   Urinary frequency 08/12/2021   Herpes zoster without complication 05/04/2021   Hand pain, left 10/14/2020   Swelling of hand joint, left 10/14/2020   TMJ (temporomandibular joint syndrome) 08/18/2020   Non-intractable cyclical vomiting 05/15/2020    Left-sided back pain 02/25/2020   Hx of adenomatous polyp of colon 02/14/2020   Seasonal allergic rhinitis 01/21/2020   Chronic cough 10/04/2019   Headache, post-traumatic, acute 01/28/2019   Head injury, closed 01/17/2019   Abnormal vaginal bleeding 07/06/2018   Neck pain 06/25/2018   Migraine, chronic, without aura 04/12/2018   B12 deficiency 01/10/2018   Foot pain, bilateral 01/09/2018   Hand paresthesia 01/09/2018   Vitamin D  deficiency 01/09/2018   Upper abdominal pain 11/22/2016   Chronic myofascial pain 10/26/2016   Encounter for screening mammogram for breast cancer 10/25/2016   HSV-2 infection 08/17/2016   Adverse effects of medication 11/25/2015   Vertigo 10/08/2015   Carpal tunnel syndrome 03/16/2015   Chronic nausea 02/17/2015   Gout 12/22/2014   Vertiginous migraine 02/03/2014   Other primary thrombocytopenia (HCC) 10/01/2012   Intractable migraine 08/31/2012   Osteoarthritis 05/11/2012   Female bladder prolapse 05/11/2012   ADJ DISORDER WITH MIXED ANXIETY & DEPRESSED MOOD 06/30/2008   HYPERHIDROSIS 01/25/2008   BACK PAIN, LUMBAR, CHRONIC 10/16/2007   REACTION, ACUTE STRESS W/EMOTIONAL DSTURB 08/01/2007   Prediabetes 08/01/2007   Hypertension 08/01/2007   Goiter 07/11/2007   POLYCYSTIC OVARIAN DISEASE 05/24/2007   HIRSUTISM 05/24/2007   Arthropathy, multiple sites 05/24/2007   Past Medical History:  Diagnosis Date   Arthritis    Depression    Fibromyalgia    Headache(784.0)    Hx of adenomatous polyp of colon 02/14/2020   Hypertension    Immune deficiency disorder    Migraine headache    Shingles    Stroke (HCC)    Thrombocytopenia 10/01/2012   Past Surgical History:  Procedure Laterality Date   ABDOMINAL HYSTERECTOMY     CARPAL TUNNEL RELEASE Left 09/25/2018   Procedure: LEFT CARPAL TUNNEL RELEASE;  Surgeon: Murrell Kuba, MD;  Location: [facility];  Service: Orthopedics;  Laterality: Left;   CESAREAN SECTION  2008   x1   PARTIAL  HYSTERECTOMY  2012   due to fibroid   UPPER GASTROINTESTINAL ENDOSCOPY     WISDOM TOOTH EXTRACTION  1995   Social History[1] Family History  Problem Relation Age of Onset   Healthy Mother    Diabetes Father    COPD Father    Breast cancer Paternal Aunt    Lung cancer Paternal Aunt    Lung cancer Paternal Uncle    Breast cancer Paternal Grandmother    Colon cancer Paternal Grandmother    Rectal cancer Paternal Grandmother  Esophageal cancer Cousin    Stomach cancer Neg Hx    Allergies[2] Medications Ordered Prior to Encounter[3]  Review of Systems  Constitutional:  Positive for fatigue. Negative for activity change, appetite change, fever and unexpected weight change.  HENT:  Negative for congestion, ear pain, rhinorrhea, sinus pressure and sore throat.   Eyes:  Negative for pain, redness and visual disturbance.  Respiratory:  Negative for cough, shortness of breath and wheezing.   Cardiovascular:  Negative for chest pain and palpitations.  Gastrointestinal:  Negative for abdominal pain, blood in stool, constipation and diarrhea.       Intermittent nausea   Endocrine: Negative for polydipsia and polyuria.  Genitourinary:  Negative for dysuria, frequency and urgency.  Musculoskeletal:  Positive for arthralgias, myalgias and neck pain. Negative for back pain and joint swelling.       Achey muscle/myofascial pain   Skin:  Negative for pallor and rash.  Allergic/Immunologic: Negative for environmental allergies.  Neurological:  Positive for numbness and headaches. Negative for dizziness, syncope and facial asymmetry.       Tingling from CTS in hand  Hematological:  Negative for adenopathy. Does not bruise/bleed easily.  Psychiatric/Behavioral:  Negative for decreased concentration and dysphoric mood. The patient is not nervous/anxious.        Stress is high   History of dep/anx mood        Objective:   Physical Exam Constitutional:      General: She is not in acute  distress.    Appearance: Normal appearance. She is well-developed. She is obese. She is not ill-appearing or diaphoretic.  HENT:     Head: Normocephalic and atraumatic.     Mouth/Throat:     Mouth: Mucous membranes are moist.  Eyes:     General: No scleral icterus.    Conjunctiva/sclera: Conjunctivae normal.     Pupils: Pupils are equal, round, and reactive to light.  Neck:     Thyroid : No thyromegaly.     Vascular: No carotid bruit or JVD.  Cardiovascular:     Rate and Rhythm: Normal rate and regular rhythm.     Heart sounds: Normal heart sounds.     No gallop.  Pulmonary:     Effort: Pulmonary effort is normal. No respiratory distress.     Breath sounds: Normal breath sounds. No stridor. No wheezing, rhonchi or rales.  Abdominal:     General: There is no distension or abdominal bruit.     Palpations: Abdomen is soft.  Musculoskeletal:     Cervical back: Normal range of motion and neck supple.     Right lower leg: No edema.     Left lower leg: No edema.     Comments: Myofascial tenderness in arms and shoulder girdle No swollen joints No joint deformity  Lymphadenopathy:     Cervical: No cervical adenopathy.  Skin:    General: Skin is warm and dry.     Coloration: Skin is not pale.     Findings: No rash.  Neurological:     Mental Status: She is alert.     Cranial Nerves: No cranial nerve deficit.     Coordination: Coordination normal.     Deep Tendon Reflexes: Reflexes are normal and symmetric. Reflexes normal.  Psychiatric:        Mood and Affect: Mood normal.           Assessment & Plan:   Problem List Items Addressed This Visit  Cardiovascular and Mediastinum   Small vessel disease, cerebrovascular   This was noted on MRI brain in 2013 in the right thalamus (appeared old) No acute stroke known  Recent CT head in 12/2023 normal   Working to control blood pressure and cholesterol       Hypertension - Primary   bp in fair control at this time   Better on re check (was high in her hand surgeon's office with heavy clothing) BP Readings from Last 1 Encounters:  10/10/24 133/82   Currently taking oral clonidine  0.1 mg bid  No longer using patch No longer taking amlodipine  10 due to ankle swelling - but open to taking 5 mg as needed (and for her upcoming hand surgery)   Reviewed most recent labs Has labs more recently from gyn-sent for those   Based on this will have to have [place] Has cardiology clearance upcoming       Relevant Orders   CBC with Differential/Platelet (Completed)     Other   Vitamin D  deficiency   D level today  Orally supplementing  Is on ppi  Discussed impt of D for bone and overall health       Relevant Orders   VITAMIN D  25 Hydroxy (Vit-D Deficiency, Fractures) (Completed)   Chronic myofascial pain   Worse in arms recently  In setting of stressors and other health problems   ESR, cbc today      Bilateral arm pain   Achey pain  In addition go her CTS  Some myofascial tenderness ESR and cbc today  Would like to r/o PMR      Relevant Orders   CBC with Differential/Platelet (Completed)   Sedimentation Rate (Completed)      [1]  Social History Tobacco Use   Smoking status: Never   Smokeless tobacco: Never  Vaping Use   Vaping status: Never Used  Substance Use Topics   Alcohol use: Yes    Comment: occasional   Drug use: Yes    Types: Marijuana    Comment: smokes small amount twice daily  [2]  Allergies Allergen Reactions   Trazodone And Nefazodone Nausea And Vomiting   Amoxicillin  Other (See Comments)    Yeast infection    Paxil  [Paroxetine ]     Vomiting    Trazodone Nausea And Vomiting   Carafate  [Sucralfate ] Nausea And Vomiting  [3]  Current Outpatient Medications on File Prior to Visit  Medication Sig Dispense Refill   ALPRAZolam  (XANAX ) 1 MG tablet Take 1 mg by mouth 4 (four) times daily as needed for anxiety or sleep.       amitriptyline  (ELAVIL ) 10 MG tablet TAKE ONE TABLET BY MOUTH EVERY MORNING 90 tablet 1   amitriptyline  (ELAVIL ) 25 MG tablet Take 2 tablets (50 mg total) by mouth daily. 60 tablet 0   amLODipine  (NORVASC ) 10 MG tablet Take 1 tablet (10 mg total) by mouth daily. (Patient taking differently: Take 5 mg by mouth daily as needed (in evening).) 90 tablet 3   cloNIDine  (CATAPRES  - DOSED IN MG/24 HR) 0.2 mg/24hr patch APPLY 1 PATCH TOPICALLY TO SKIN  ONCE WEEKLY 12 patch 3   cloNIDine  (CATAPRES ) 0.1 MG tablet Take 1 tablet (0.1 mg total) by mouth 2 (two) times daily as needed (for pressure >170). 60 tablet 4   cyclobenzaprine  (FLEXERIL ) 10 MG tablet Take 1 tablet (10 mg total) by mouth at bedtime as needed for muscle spasms. 30 tablet 3   famotidine  (PEPCID )  20 MG tablet Take 1 tablet (20 mg total) by mouth 2 (two) times daily. 180 tablet 3   hyoscyamine  (LEVSIN  SL) 0.125 MG SL tablet Place 1 tablet (0.125 mg total) under the tongue every 4 (four) hours as needed for cramping. 15 tablet 0   meclizine  (ANTIVERT ) 25 MG tablet Take 50 mg by mouth as needed for dizziness.     metoCLOPramide  (REGLAN ) 10 MG tablet TAKE ONE TABLET BY MOUTH EVERY 6 HOURS AS NEEDED FOR NAUSEA OR VOMITING (Patient taking differently: Take 10 mg by mouth every 6 (six) hours as needed for vomiting or nausea.) 60 tablet 0   ondansetron  (ZOFRAN ) 8 MG tablet Take 1 tablet (8 mg total) by mouth 3 (three) times daily. 30 tablet 3   oxybutynin (OXYTROL) 3.9 MG/24HR Place 1 patch onto the skin every 3 (three) days.     pantoprazole  (PROTONIX ) 40 MG tablet Take 1 tablet (40 mg total) by mouth daily. 90 tablet 2   valACYclovir  (VALTREX ) 1000 MG tablet Take 1 tablet (1,000 mg total) by mouth 3 (three) times daily. 21 tablet 0   No current facility-administered medications on file prior to visit.

## 2024-10-10 NOTE — Assessment & Plan Note (Signed)
 Worse in arms recently  In setting of stressors and other health problems   ESR, cbc today

## 2024-10-10 NOTE — Assessment & Plan Note (Signed)
 Achey pain  In addition go her CTS  Some myofascial tenderness ESR and cbc today  Would like to r/o PMR

## 2024-10-10 NOTE — Assessment & Plan Note (Signed)
 D level today  Orally supplementing  Is on ppi  Discussed impt of D for bone and overall health

## 2024-10-10 NOTE — Telephone Encounter (Signed)
 Pt scheduled for VV on 1/2

## 2024-10-10 NOTE — Assessment & Plan Note (Addendum)
 bp in fair control at this time  Better on re check (was high in her hand surgeon's office with heavy clothing) BP Readings from Last 1 Encounters:  10/10/24 133/82   Currently taking oral clonidine  0.1 mg bid  No longer using patch No longer taking amlodipine  10 due to ankle swelling - but open to taking 5 mg as needed (and for her upcoming hand surgery)   Reviewed most recent labs Has labs more recently from gyn-sent for those   Based on this will have to have her C[place]) Has cardiology clearance upcoming

## 2024-10-10 NOTE — Telephone Encounter (Signed)
 Pt has an apppt scheduled today already

## 2024-10-10 NOTE — Patient Instructions (Addendum)
 2nd blood pressure is better today   Continue the twice daily clonidine    The week of your surgery take amlodipine  5 mg in evening daily  See how you tolerate that    Your recent CT brain was fine Follow up with cardiology    Make sure you are taking your vitamin D    Lab today for  Sed rate (arm pain) Cbc Vitamin D 

## 2024-10-15 ENCOUNTER — Ambulatory Visit: Payer: Self-pay | Admitting: Family Medicine

## 2024-10-20 ENCOUNTER — Encounter: Payer: Self-pay | Admitting: Family Medicine

## 2024-11-01 ENCOUNTER — Encounter: Payer: Self-pay | Admitting: Family Medicine

## 2024-11-01 ENCOUNTER — Ambulatory Visit: Attending: Cardiology

## 2024-11-01 NOTE — Telephone Encounter (Signed)
 Pt has been scheduled to see Tylene Lunch, NP 11/07/24 @ 10:55 for preop clearance. See notes.     Campbell, Kenzie E, NP  Nurse Practitioner Cardiology   Progress Notes    Signed   Encounter Date: 11/01/2024   Signed          Patient Name: Dana Greer  DOB: 1971-04-22 MRN: [id number]   Primary Cardiologist: Lonni Hanson, MD   Chart reviewed as part of pre-operative protocol coverage.    Called patient, she reports she's been having epigastric pain radiating into her chest, orthopnea, daily palpitations, near syncope, vomiting, and diaphoresis. Will need an in-office visit for further cardiac testing prior to her upcoming surgery.   Preop team, please reach out to schedule patient for an in-office visit and notify the surgeon's office for the need for further cardiovascular work up.      Kenzie E Campbell, NP 11/01/2024, 10:49 AM

## 2024-11-01 NOTE — Progress Notes (Signed)
"  ° °  Patient Name: Dana Greer  DOB: Dec 15, 1970 MRN: [id number]  Primary Cardiologist: Lonni Hanson, MD  Chart reviewed as part of pre-operative protocol coverage.   Called patient, she reports she's been having epigastric pain radiating into her chest, orthopnea, daily palpitations, near syncope, vomiting, and diaphoresis. Will need an in-office visit for further cardiac testing prior to her upcoming surgery.  Preop team, please reach out to schedule patient for an in-office visit and notify the surgeon's office for the need for further cardiovascular work up.    Danitza Schoenfeldt E Bashir Marchetti, NP 11/01/2024, 10:49 AM    "

## 2024-11-04 NOTE — Telephone Encounter (Signed)
 I spoke with pt; pt said on 10/30/24 bent over to pick something up and pt hit head on chair. Pt did not lose consciousness but pt did have h/a, lightheadedness and nausea. Pt said no H/A or other symptoms since 11/02/24; pt has had concussion before and wants reminder on what to do when has concussion. Pt said does not need appt since no symptoms in couple of days. UC & ED precautions given and pt voiced understanding; pt wants cb after Dr Randeen reviews note. Sending note to Dr Randeen and [employer].

## 2024-11-07 ENCOUNTER — Ambulatory Visit: Attending: Cardiology | Admitting: Cardiology

## 2024-11-07 ENCOUNTER — Encounter: Payer: Self-pay | Admitting: Cardiology

## 2024-11-07 VITALS — BP 143/95 | HR 78 | Ht 62.0 in | Wt 183.6 lb

## 2024-11-07 DIAGNOSIS — E785 Hyperlipidemia, unspecified: Secondary | ICD-10-CM | POA: Diagnosis not present

## 2024-11-07 DIAGNOSIS — I428 Other cardiomyopathies: Secondary | ICD-10-CM

## 2024-11-07 DIAGNOSIS — Z01810 Encounter for preprocedural cardiovascular examination: Secondary | ICD-10-CM | POA: Diagnosis not present

## 2024-11-07 DIAGNOSIS — R072 Precordial pain: Secondary | ICD-10-CM | POA: Diagnosis not present

## 2024-11-07 DIAGNOSIS — I10 Essential (primary) hypertension: Secondary | ICD-10-CM

## 2024-11-07 DIAGNOSIS — M4125 Other idiopathic scoliosis, thoracolumbar region: Secondary | ICD-10-CM | POA: Diagnosis not present

## 2024-11-07 MED ORDER — METOPROLOL TARTRATE 100 MG PO TABS: 100.0000 mg | ORAL_TABLET | Freq: Once | ORAL | 0 refills | Status: DC

## 2024-11-07 NOTE — Patient Instructions (Signed)
 Medication Instructions:  Your physician recommends that you continue on your current medications as directed. Please refer to the Current Medication list given to you today.   *If you need a refill on your cardiac medications before your next appointment, please call your pharmacy*  Lab Work: Your provider would like for you to have following labs drawn today BMP.   If you have labs (blood work) drawn today and your tests are completely normal, you will receive your results only by: MyChart Message (if you have MyChart) OR A paper copy in the mail If you have any lab test that is abnormal or we need to change your treatment, we will call you to review the results.  Testing/Procedures:   Your cardiac CT will be scheduled at one of the below locations:   [facility] [address] [place], [place] [address] [phone] (Severe contrast allergies only)  OR   [facility] [address] [place], [place] [address] [phone]  OR   [facility] [address] [place], [place] [address] [phone]  OR   [name] [address]  [place], [place] [address]  OR   [facility] [address] [place], [place] [phone]  If scheduled at [facility], please arrive at the [facility] and Children's Entrance (Entrance C2) of [facility] 30 minutes prior to test start time. You can use the FREE valet parking offered at entrance C (encouraged to control the heart rate for the test)  Proceed to the [facility] Radiology Department (first floor) to check-in and test prep.  All radiology patients and guests should use entrance C2 at [facility], accessed from [facility], even though the hospital's physical address listed is [address].  If scheduled at the Heart and Vascular [name] at [address], please enter the parking lot using the  [address] entrance and use the FREE valet service at the patient drop-off area. Enter the building and check-in with registration on the main floor.  If scheduled at [facility], please arrive to the Heart and Vascular Center 15 mins early for check-in and test prep.  There is spacious parking and easy access to the radiology department from the [facility] Heart and Vascular entrance. Please enter here and check-in with the desk attendant.   If scheduled at [facility], please arrive 30 minutes early for check-in and test prep.  Please follow these instructions carefully (unless otherwise directed):  An IV will be required for this test and Nitroglycerin  will be given.  Hold all erectile dysfunction medications at least 3 days (72 hrs) prior to test. (Ie viagra, cialis, sildenafil, tadalafil, etc)   On the Night Before the Test: Be sure to Drink plenty of water. Do not consume any caffeinated/decaffeinated beverages or chocolate 12 hours prior to your test. Do not take any antihistamines 12 hours prior to your test.  On the Day of the Test: Drink plenty of water until 1 hour prior to the test. Do not eat any food 1 hour prior to test. You may take your regular medications prior to the test.  Take metoprolol  (Lopressor ) two hours prior to test. If you take Furosemide/Hydrochlorothiazide/Spironolactone/Chlorthalidone, please HOLD on the morning of the test. Patients who wear a continuous glucose monitor MUST remove the device prior to scanning. FEMALES- please wear underwire-free bra if available, avoid dresses & tight clothing  After  the Test: Drink plenty of water. After receiving IV contrast, you may experience a mild flushed feeling. This is normal. On occasion, you may experience a mild rash up to 24 hours after the test. This is not dangerous. If this occurs, you can take Benadryl  25 mg, Zyrtec, Claritin, or Allegra and increase your fluid intake.  (Patients taking Tikosyn should avoid Benadryl , and may take Zyrtec, Claritin, or Allegra) If you experience trouble breathing, this can be serious. If it is severe call 911 IMMEDIATELY. If it is mild, please call our office.  We will call to schedule your test 2-4 weeks out understanding that some insurance companies will need an authorization prior to the service being performed.   For more information and frequently asked questions, please visit our website : [email or web address]  For non-scheduling related questions, please contact the cardiac imaging nurse navigator should you have any questions/concerns: Cardiac Imaging Nurse Navigators Direct Office Dial: [phone]   For scheduling needs, including cancellations and rescheduling, please call Brittany, [phone].   Follow-Up: At [facility], you and your health needs are our priority.  As part of our continuing mission to provide you with exceptional heart care, our providers are all part of one team.  This team includes your primary Cardiologist (physician) and Advanced Practice Providers or APPs (Physician Assistants and Nurse Practitioners) who all work together to provide you with the care you need, when you need it.  Your next appointment:   6 month(s)  Provider:   You may see Lonni Hanson, MD or one of the following Advanced Practice Providers on your designated Care Team:   Tylene Lunch, NP

## 2024-11-07 NOTE — Progress Notes (Addendum)
 " Cardiology Office Note   Date:  11/07/2024  ID:  Dana Greer, DOB 1971/09/29, MRN [id number] PCP: Randeen [name], MD  [facility] Providers Cardiologist:  Lonni Hanson, MD     History of Present Illness Dana Greer is a 54 y.o. female with past medical history of depression, headaches, fibromyalgia, hypertension, HFpEF, and a stroke, who presents today requiring preprocedure cardiovascular examination.   She had previously been seen evaluated in the emergency department 10/05/2022 where she had severe hypertension with a systolic blood pressure of greater than 200 mmHg.  Echocardiogram completed 10/06/2022 showed an LVEF of 45 to 50%, new wall motion abnormality when compared to prior echoes.  Outpatient Lexiscan  Myoview  was arranged.  Stress testing was low risk and no revascularization was indicated.  She did have some serial troponins in the ED with the greatest being 437.  This is felt to be demand ischemia.  Cholecystitis was found on CTA.  She underwent a HIDA scan which was reassuring and she was treated conservatively with IV antibiotics and IV fluids.  She was seen in the emergency department 12///23 for left arm swelling.  She recently been admitted and discharged.  She states that when she had the IV removed and discharged and noted some swelling into her left upper arm with associated pain.  There was no redness, fevers or chills and no purulent drainage.  On arrival vitals were stable.  Bedside ultrasound was performed and revealed cobblestoning concerning for fluid in the soft tissue.  As a precaution she was placed on antibiotics and recommended compression and elevation to the affected extremity.  She had later had complaints of chest discomfort and echocardiogram ordered.  She had an LVEF of 45-50%.  She does and regional wall motion abnormalities and because of this a Lexiscan  Myoview  stress was ordered.  Unfortunately this study was normal and considered  low risk.  She was last seen in clinic in April 2025 at that time she denied any significant changes.  She did not have complaints of angina or decompensation.  She did have vertigo and occasional palpitations.  The patient reports 11 years of episodes of nausea vomiting and headache and elevated blood pressure.  States she was recently seen in the emergency department on 3/18 for blood pressure of 217/99.  There were no medication changes that were made and no further testing that was ordered.   She presents to clinic today stating overall she has been doing fairly well.  She has been having epigastric discomfort that radiates up into the chest that causes nausea with vomiting and near syncope and palpitations and dizziness and shortness of breath.  She states this has been going on for quite some time.  Thought it was related to GI so she does have a GI appointment that is upcoming in July at [facility].  She continues to suffer from chronic back pain from scoliosis.  Unfortunately she has not had any of her medications as of yet today so her blood pressure is elevated.  She denies any recent hospitalizations or visits to the emergency department.   ROS: 10 point review of system has been reviewed and considered negative the exception was been listed in the HPI  Studies Reviewed EKG Interpretation Date/Time:  Thursday November 07 2024 10:59:27 EST Ventricular Rate:  78 PR Interval:  176 QRS Duration:  78 QT Interval:  374 QTC Calculation: 426 R Axis:   4  Text Interpretation: Normal sinus rhythm Normal ECG  When compared with ECG of 16-Jan-2024 10:39, PREVIOUS ECG IS PRESENT Confirmed by Dana Greer ([id number]) on 11/07/2024 11:08:26 AM    Myoview  lexiscan  09/2022   The study is normal. The study is low risk.   No ST deviation was noted. TWI in leads III, aVF during stress   LV perfusion is normal. There is no evidence of ischemia. There is no evidence of infarction.   Left ventricular function is  normal. Nuclear stress EF: 62 %. The left ventricular ejection fraction is normal (55-65%). End diastolic cavity size is normal. End systolic cavity size is normal.   Prior study not available for comparison.   1. Fixed apical inferior perfusion defect with normal wall motion, consistent with artifact 2. Low risk study   Echo limited 09/2022  1. No apparent apical thrombus noted. Left ventricular ejection fraction,  by estimation, is 45 to 50%. The left ventricle has mildly decreased  function. The left ventricle demonstrates regional wall motion  abnormalities (see scoring diagram/findings for   description). Left ventricular diastolic parameters are indeterminate.   2. Right ventricular systolic function was not well visualized. The right  ventricular size is not well visualized.   3. The mitral valve is normal in structure. not assessed mitral valve  regurgitation. Not assessed mitral stenosis.   4. The aortic valve was not well visualized. Aortic valve regurgitation  Not assessed. not assessed.    Echo 04/2022 1. Left ventricular ejection fraction, by estimation, is 60 to 65%. The  left ventricle has normal function. The left ventricle has no regional  wall motion abnormalities. Left ventricular diastolic parameters are  consistent with Grade I diastolic  dysfunction (impaired relaxation).   2. Right ventricular systolic function is normal. The right ventricular  size is normal. Tricuspid regurgitation signal is inadequate for assessing  PA pressure.   3. Left atrial size was mildly dilated.   4. The mitral valve is normal in structure. Mild mitral valve  regurgitation. No evidence of mitral stenosis.   5. The aortic valve is normal in structure. Aortic valve regurgitation is  not visualized. No aortic stenosis is present.   6. The inferior vena cava is normal in size with greater than 50%  respiratory variability, suggesting right atrial pressure of 3 mmHg.    Risk  Assessment/Calculations   HYPERTENSION CONTROL Vitals:   11/07/24 1053 11/07/24 1113  BP: (!) 141/98 (!) 143/95    The patient's blood pressure is elevated above target today.  In order to address the patient's elevated BP: Blood pressure will be monitored at home to determine if medication changes need to be made. (Patient has not taken any of her blood pressure medications as of yet today)          Physical Exam VS:  BP (!) 143/95 (BP Location: Left Arm, Patient Position: Sitting, Cuff Size: Large)   Pulse 78   Ht 5' 2 (1.575 m)   Wt 183 lb 9.6 oz (83.3 kg)   LMP 10/16/2019 Comment: had hysterectomy-still has half of her uterus and ovaries  SpO2 99%   BMI 33.58 kg/m        Wt Readings from Last 3 Encounters:  11/07/24 183 lb 9.6 oz (83.3 kg)  10/10/24 184 lb 2 oz (83.5 kg)  08/22/24 180 lb (81.6 kg)    GEN: Well nourished, well developed in no acute distress NECK: No JVD; No carotid bruits CARDIAC: RRR, no murmurs, rubs, gallops RESPIRATORY:  Clear to auscultation without rales, wheezing or  rhonchi  ABDOMEN: Soft, non-tender, non-distended EXTREMITIES:  No edema; No deformity   ASSESSMENT AND PLAN Atypical chest discomfort where she states that she has symptoms of epigastric pain that radiates into her chest with associated symptoms of orthopnea, palpitations, this cause near syncope, vomiting and diaphoresis. EKG reveals sinus rhythm with a rate of 78 with no acute ischemic changes from prior study.  With her ongoing symptoms that she stated been lasting for quite some time she has been scheduled for coronary CTA to rule out any ischemic causes of symptoms.  Hypertension with a blood pressure today 141/98.  Unfortunately patient has not had any of her medications prior to her visit today.  She has been encouraged to continue with her amlodipine  10 mg daily and her clonidine  0 point milligrams twice daily for blood pressure greater than 170.  She previously had been doing  the clonidine  patch but states she is not on the patch any longer and is only using the pills.  She has been encouraged to monitor her pressure 1 to 2 hours postmedication administration at home as well.  History of cardiomyopathy with an LVEF of 45-50% with mildly decreased function.  At that time she had nuclear stress testing which was considered a low risk scan.  She appears to be euvolemic on exam.  Suffering from NYHA class I-II symptoms.   Hyperlipidemia with an LDL of 62.  Continues to remain off of statin therapy.  Lipids are relatively well-controlled.  Scoliosis with chronic back pain.  Preoperative cardiovascular examination    Ms. Greer's perioperative risk of a major cardiac event is 0.9% according to the Revised Cardiac Risk Index (RCRI).  Therefore, she is at low risk for perioperative complications.   Her functional capacity is good at 5.38 METs according to the Duke Activity Status Index (DASI). Recommendations: Patient has been scheduled form coronary CTA with complaints of atypical chest pain scheduled for tomorrow 11/08/2024 in [place].  Clearance will be offered once testing has been completed.       Dispo: Patient to return to clinic to see MD/APP in 6 months or sooner if needed for further evaluation.  Signed, Jacquelinne Speak, NP   "

## 2024-11-08 ENCOUNTER — Ambulatory Visit (HOSPITAL_COMMUNITY)
Admission: RE | Admit: 2024-11-08 | Discharge: 2024-11-08 | Disposition: A | Source: Ambulatory Visit | Attending: Cardiology | Admitting: Cardiology

## 2024-11-08 ENCOUNTER — Encounter: Payer: Self-pay | Admitting: Family Medicine

## 2024-11-08 ENCOUNTER — Ambulatory Visit: Payer: Self-pay | Admitting: Cardiology

## 2024-11-08 DIAGNOSIS — I10 Essential (primary) hypertension: Secondary | ICD-10-CM

## 2024-11-08 DIAGNOSIS — R072 Precordial pain: Secondary | ICD-10-CM | POA: Insufficient documentation

## 2024-11-08 LAB — BASIC METABOLIC PANEL WITH GFR
BUN/Creatinine Ratio: 11 (ref 9–23)
BUN: 12 mg/dL (ref 6–24)
CO2: 26 mmol/L (ref 20–29)
Calcium: 9.3 mg/dL (ref 8.7–10.2)
Chloride: 103 mmol/L (ref 96–106)
Creatinine, Ser: 1.1 mg/dL — ABNORMAL HIGH (ref 0.57–1.00)
Glucose: 94 mg/dL (ref 70–99)
Potassium: 4.8 mmol/L (ref 3.5–5.2)
Sodium: 140 mmol/L (ref 134–144)
eGFR: 60 mL/min/1.73

## 2024-11-08 MED ORDER — NITROGLYCERIN 0.4 MG SL SUBL
0.8000 mg | SUBLINGUAL_TABLET | Freq: Once | SUBLINGUAL | Status: AC
  Administered 2024-11-08: 0.8 mg via SUBLINGUAL

## 2024-11-08 MED ORDER — IOHEXOL 350 MG/ML SOLN
100.0000 mL | Freq: Once | INTRAVENOUS | Status: AC | PRN
  Administered 2024-11-08: 100 mL via INTRAVENOUS

## 2024-11-08 NOTE — Progress Notes (Signed)
 Preprocedure labs prior to coronary CTA.  Recommend repeating BMP in 1 week to ensure kidney function remains at baseline.

## 2024-11-14 NOTE — Progress Notes (Signed)
 CT of the chest over read reveals no significant findings.  Overall reassuring study.

## 2024-11-22 NOTE — Progress Notes (Signed)
 Surgical Instructions   Your procedure is scheduled on Thursday November 28, 2024. Report to [facility] Main Entrance A at 6:00 A.M., then check in with the Admitting office. Any questions or running late day of surgery: call [phone]  Questions prior to your surgery date: call [phone], Monday-Friday, 8am-4pm. If you experience any cold or flu symptoms such as cough, fever, chills, shortness of breath, etc. between now and your scheduled surgery, please notify us  at the above number.     Remember:  Do not eat after midnight the night before your surgery   You may drink clear liquids until 5:00 the morning of your surgery.   Clear liquids allowed are: Water , Non-Citrus Juices (without pulp), Carbonated Beverages, Clear Tea (no milk, honey, etc.), Black Coffee Only (NO MILK, CREAM OR POWDERED CREAMER of any kind), and Gatorade.  Patient Instructions  The night before surgery:  No food after midnight. ONLY clear liquids after midnight  The day of surgery (if you do NOT have diabetes):  Drink ONE (1) Pre-Surgery Clear Ensure by 5:00 the morning of surgery. Drink in one sitting. Do not sip.  This drink was given to you during your hospital  pre-op appointment visit.  Nothing else to drink after completing the  Pre-Surgery Clear Ensure.         If you have questions, please contact your surgeons office.    Take these medicines the morning of surgery with A SIP OF WATER   amitriptyline  (ELAVIL )  amLODipine  (NORVASC )  cloNIDine  (CATAPRES )  solifenacin (VESICARE)   May take these medicines IF NEEDED: NONE   One week prior to surgery, STOP taking any Aspirin  (unless otherwise instructed by your surgeon) Aleve, Naproxen, Ibuprofen , Motrin , Advil , Goody's, BC's, all herbal medications, fish oil, and non-prescription vitamins.                     Do NOT Smoke (Tobacco/Vaping) for 24 hours prior to your procedure.  If you use a CPAP at night, you may bring your  mask/headgear for your overnight stay.   You will be asked to remove any contacts, glasses, piercing's, hearing aid's, dentures/partials prior to surgery. Please bring cases for these items if needed.    Your surgeon will determine if you are to be admitted or discharged the same day.  Patients discharged the day of surgery will not be allowed to drive home, and someone needs to stay with them for 24 hours.  SURGICAL WAITING ROOM VISITATION Patients may have no more than 2 support people in the waiting area - these visitors may rotate.   Pre-op nurse will coordinate an appropriate time for 2 ADULT support persons, who may not rotate, to accompany patient in pre-op.  Children under the age of 90 must have an adult with them who is not the patient and must remain in the main waiting area with an adult.  If the patient needs to stay at the hospital during part of their recovery, the visitor guidelines for inpatient rooms apply.  Please refer to the [facility] website for the visitor guidelines for any additional information.   If you received a COVID test during your pre-op visit  it is requested that you wear a mask when out in public, stay away from anyone that may not be feeling well and notify your surgeon if you develop symptoms. If you have been in contact with anyone that has tested positive in the last 10 days please notify you surgeon.  Pre-operative CHG Bathing Instructions   You can play a key role in reducing the risk of infection after surgery. Your skin needs to be as free of germs as possible. You can reduce the number of germs on your skin by washing with CHG (chlorhexidine  gluconate) soap before surgery. CHG is an antiseptic soap that kills germs and continues to kill germs even after washing.   DO NOT use if you have an allergy to chlorhexidine /CHG or antibacterial soaps. If your skin becomes reddened or irritated, stop using the CHG and notify one of our RNs at  [phone].              TAKE A SHOWER THE NIGHT BEFORE SURGERY   Please keep in mind the following:  DO NOT shave, including legs and underarms, 48 hours prior to surgery.   Place clean sheets on your bed the night before surgery Use a clean washcloth (not used since being washed) for shower. DO NOT sleep with pet's night before surgery.  CHG Shower Instructions:  Wash your face and private area with normal soap. If you choose to wash your hair, wash first with your normal shampoo.  After you use shampoo/soap, rinse your hair and body thoroughly to remove shampoo/soap residue.  Turn the water  OFF and apply half the bottle of CHG soap to a CLEAN washcloth.  Apply CHG soap ONLY FROM YOUR NECK DOWN TO YOUR TOES (washing for 3-5 minutes)  DO NOT use CHG soap on face, private areas, open wounds, or sores.  Pay special attention to the area where your surgery is being performed.  If you are having back surgery, having someone wash your back for you may be helpful. Wait 2 minutes after CHG soap is applied, then you may rinse off the CHG soap.  Pat dry with a clean towel  Put on clean pajamas    Additional instructions for the day of surgery: If you choose, you may shower the morning of surgery with an antibacterial soap.  DO NOT APPLY any lotions, deodorants or perfumes.   Do not wear jewelry or makeup Do not wear nail polish, gel polish, artificial nails, or any other type of covering on natural nails (fingers and toes) Do not bring valuables to the hospital. [facility] is not responsible for valuables/personal belongings. Put on clean/comfortable clothes.  Please brush your teeth.  Ask your nurse before applying any prescription medications to the skin.

## 2024-11-26 ENCOUNTER — Other Ambulatory Visit: Payer: Self-pay

## 2024-11-26 ENCOUNTER — Inpatient Hospital Stay (HOSPITAL_COMMUNITY)
Admission: RE | Admit: 2024-11-26 | Discharge: 2024-11-26 | Disposition: A | Source: Ambulatory Visit | Attending: Orthopedic Surgery | Admitting: Orthopedic Surgery

## 2024-11-26 ENCOUNTER — Encounter (HOSPITAL_COMMUNITY): Payer: Self-pay

## 2024-11-26 VITALS — BP 152/98 | HR 73 | Temp 98.6°F | Resp 17 | Ht 63.0 in | Wt 185.2 lb

## 2024-11-26 DIAGNOSIS — Z8673 Personal history of transient ischemic attack (TIA), and cerebral infarction without residual deficits: Secondary | ICD-10-CM | POA: Diagnosis not present

## 2024-11-26 DIAGNOSIS — D849 Immunodeficiency, unspecified: Secondary | ICD-10-CM | POA: Insufficient documentation

## 2024-11-26 DIAGNOSIS — Z01812 Encounter for preprocedural laboratory examination: Secondary | ICD-10-CM | POA: Diagnosis not present

## 2024-11-26 DIAGNOSIS — G5601 Carpal tunnel syndrome, right upper limb: Secondary | ICD-10-CM | POA: Insufficient documentation

## 2024-11-26 DIAGNOSIS — Z01818 Encounter for other preprocedural examination: Secondary | ICD-10-CM

## 2024-11-26 DIAGNOSIS — I509 Heart failure, unspecified: Secondary | ICD-10-CM | POA: Insufficient documentation

## 2024-11-26 DIAGNOSIS — K219 Gastro-esophageal reflux disease without esophagitis: Secondary | ICD-10-CM | POA: Diagnosis not present

## 2024-11-26 DIAGNOSIS — I110 Hypertensive heart disease with heart failure: Secondary | ICD-10-CM | POA: Diagnosis not present

## 2024-11-26 HISTORY — DX: Gastro-esophageal reflux disease without esophagitis: K21.9

## 2024-11-26 LAB — BASIC METABOLIC PANEL WITH GFR
Anion gap: 6 (ref 5–15)
BUN: 11 mg/dL (ref 6–20)
CO2: 30 mmol/L (ref 22–32)
Calcium: 9.2 mg/dL (ref 8.9–10.3)
Chloride: 103 mmol/L (ref 98–111)
Creatinine, Ser: 0.99 mg/dL (ref 0.44–1.00)
GFR, Estimated: >60 mL/min
Glucose, Bld: 155 mg/dL — ABNORMAL HIGH (ref 70–99)
Potassium: 4.3 mmol/L (ref 3.5–5.1)
Sodium: 139 mmol/L (ref 135–145)

## 2024-11-26 LAB — CBC
HCT: 38.7 % (ref 36.0–46.0)
Hemoglobin: 13.3 g/dL (ref 12.0–15.0)
MCH: 33.4 pg (ref 26.0–34.0)
MCHC: 34.4 g/dL (ref 30.0–36.0)
MCV: 97.2 fL (ref 80.0–100.0)
Platelets: 156 10*3/uL (ref 150–400)
RBC: 3.98 MIL/uL (ref 3.87–5.11)
RDW: 13.4 % (ref 11.5–15.5)
WBC: 5 10*3/uL (ref 4.0–10.5)
nRBC: 0 % (ref 0.0–0.2)

## 2024-11-26 NOTE — Progress Notes (Signed)
 PCP - Dr. Manford Balls Cardiologist - Dr. Lonni End.      -clearance from Tylene Goodell on 11/17/24  PPM/ICD - denies Device Orders - n/a Rep Notified - n/a  Chest x-ray - denies EKG - 11/07/24 Stress Test -  11/23/21  ECHO - 10/06/22 Cardiac Cath - denies  Sleep Study - denies CPAP - n/a  No DM  Last dose of GLP1 agonist-  n/a GLP1 instructions:  n/a  Blood Thinner Instructions:  n/a Aspirin  Instructions: n/a  ERAS Protcol - clears until 0500 PRE-SURGERY Ensure or G2- Ensure as ordered  COVID TEST- n/a   Anesthesia review: yes - BP was 152/98 at PAT appointment.  Patient denies chest pain.  Patient states that she did not take her Amlodipine  today.  Patient instructed to check blood pressure at home and to reach out to cardiologist if BP remains high.   Patient denies shortness of breath, fever, cough and chest pain at PAT appointment   All instructions explained to the patient, with a verbal understanding of the material. Patient agrees to go over the instructions while at home for a better understanding. Patient also instructed to self quarantine after being tested for COVID-19. The opportunity to ask questions was provided.

## 2024-11-26 NOTE — Progress Notes (Signed)
 Surgical Instructions     Your procedure is scheduled on Thursday November 28, 2024. Report to [facility] Main Entrance A at 6:00 A.M., then check in with the Admitting office. Any questions or running late day of surgery: call [phone]   Questions prior to your surgery date: call [phone], Monday-Friday, 8am-4pm. If you experience any cold or flu symptoms such as cough, fever, chills, shortness of breath, etc. between now and your scheduled surgery, please notify us  at the above number.            Remember:       Do not eat after midnight the night before your surgery     You may drink clear liquids until 5:00 the morning of your surgery.   Clear liquids allowed are: Water , Non-Citrus Juices (without pulp), Carbonated Beverages, Clear Tea (no milk, honey, etc.), Black Coffee Only (NO MILK, CREAM OR POWDERED CREAMER of any kind), and Gatorade.   Patient Instructions   The night before surgery:  No food after midnight. ONLY clear liquids after midnight   The day of surgery (if you do NOT have diabetes):  Drink ONE (1) Pre-Surgery Clear Ensure by 5:00 the morning of surgery. Drink in one sitting. Do not sip.  This drink was given to you during your hospital  pre-op appointment visit.   Nothing else to drink after completing the  Pre-Surgery Clear Ensure.          If you have questions, please contact your surgeons office.          Take these medicines the morning of surgery with A SIP OF WATER   amLODipine  (NORVASC )  Famotidine  (Pepcid ) Pantoprazole  (Protonix )    May take these medicines IF NEEDED: Alprazolam  (Xanax ) Cyclobenzaprine  (Flexeril ) Meclizine  (Antivert ) Ondansetron  (Zofran )     One week prior to surgery, STOP taking any Aspirin  (unless otherwise instructed by your surgeon) Aleve, Naproxen, Ibuprofen , Motrin , Advil , Goody's, BC's, all herbal medications, fish oil, and non-prescription vitamins.                     Do NOT Smoke (Tobacco/Vaping) for 24  hours prior to your procedure.   If you use a CPAP at night, you may bring your mask/headgear for your overnight stay.   You will be asked to remove any contacts, glasses, piercing's, hearing aid's, dentures/partials prior to surgery. Please bring cases for these items if needed.    Your surgeon will determine if you are to be admitted or discharged the same day.  Patients discharged the day of surgery will not be allowed to drive home, and someone needs to stay with them for 24 hours.   SURGICAL WAITING ROOM VISITATION Patients may have no more than 2 support people in the waiting area - these visitors may rotate.   Pre-op nurse will coordinate an appropriate time for 2 ADULT support persons, who may not rotate, to accompany patient in pre-op.  Children under the age of 43 must have an adult with them who is not the patient and must remain in the main waiting area with an adult.   If the patient needs to stay at the hospital during part of their recovery, the visitor guidelines for inpatient rooms apply.   Please refer to the [facility] website for the visitor guidelines for any additional information.     If you received a COVID test during your pre-op visit  it is requested that you wear a mask when out in public, stay  away from anyone that may not be feeling well and notify your surgeon if you develop symptoms. If you have been in contact with anyone that has tested positive in the last 10 days please notify you surgeon.         Pre-operative CHG Bathing Instructions    You can play a key role in reducing the risk of infection after surgery. Your skin needs to be as free of germs as possible. You can reduce the number of germs on your skin by washing with CHG (chlorhexidine  gluconate) soap before surgery. CHG is an antiseptic soap that kills germs and continues to kill germs even after washing.    DO NOT use if you have an allergy to chlorhexidine /CHG or antibacterial soaps. If your  skin becomes reddened or irritated, stop using the CHG and notify one of our RNs at [phone].               TAKE A SHOWER THE NIGHT BEFORE SURGERY    Please keep in mind the following:  DO NOT shave, including legs and underarms, 48 hours prior to surgery.   Place clean sheets on your bed the night before surgery Use a clean washcloth (not used since being washed) for shower. DO NOT sleep with pet's night before surgery.   CHG Shower Instructions:  Wash your face and private area with normal soap. If you choose to wash your hair, wash first with your normal shampoo.  After you use shampoo/soap, rinse your hair and body thoroughly to remove shampoo/soap residue.  Turn the water  OFF and apply half the bottle of CHG soap to a CLEAN washcloth.  Apply CHG soap ONLY FROM YOUR NECK DOWN TO YOUR TOES (washing for 3-5 minutes)  DO NOT use CHG soap on face, private areas, open wounds, or sores.  Pay special attention to the area where your surgery is being performed.  If you are having back surgery, having someone wash your back for you may be helpful. Wait 2 minutes after CHG soap is applied, then you may rinse off the CHG soap.  Pat dry with a clean towel  Put on clean pajamas     Additional instructions for the day of surgery: If you choose, you may shower the morning of surgery with an antibacterial soap.  DO NOT APPLY any lotions, deodorants or perfumes.   Do not wear jewelry or makeup Do not wear nail polish, gel polish, artificial nails, or any other type of covering on natural nails (fingers and toes) Do not bring valuables to the hospital. [facility] is not responsible for valuables/personal belongings. Put on clean/comfortable clothes.  Please brush your teeth.  Ask your nurse before applying any prescription medications to the skin.

## 2024-11-27 NOTE — Anesthesia Preprocedure Evaluation (Addendum)
"                                    Anesthesia Evaluation  Patient identified by MRN, date of birth, ID band Patient awake    Reviewed: Allergy & Precautions, NPO status , Patient's Chart, lab work & pertinent test results  Airway Mallampati: II  TM Distance: >3 FB Neck ROM: Full    Dental  (+) Teeth Intact, Dental Advisory Given   Pulmonary neg pulmonary ROS, Patient abstained from smoking.   Pulmonary exam normal breath sounds clear to auscultation       Cardiovascular hypertension, Pt. on medications Normal cardiovascular exam Rhythm:Regular Rate:Normal  Echo (Limited) 10/06/2022: IMPRESSIONS   1. No apparent apical thrombus noted. Left ventricular ejection fraction,  by estimation, is 45 to 50%. The left ventricle has mildly decreased  function. The left ventricle demonstrates regional wall motion  abnormalities (The mid anteroseptal segment, apical lateral segment, apical inferior  segment, and apex are akinetic. The entire anterior wall, antero-lateral  wall, inferior septum, inferior wall, posterior wall, and basal  anteroseptal  segment are hypokinetic). Left ventricular diastolic parameters are indeterminate.   2. Right ventricular systolic function was not well visualized. The right  ventricular size is not well visualized.   3. The mitral valve is normal in structure. not assessed mitral valve  regurgitation. Not assessed mitral stenosis.   4. The aortic valve was not well visualized. Aortic valve regurgitation  Not assessed. not assessed.  - Comparison(s): A prior study was performed on 05/06/2022. Wall motion  abnormailties are new.     Neuro/Psych  Headaches PSYCHIATRIC DISORDERS Anxiety Depression    CVA, No Residual Symptoms    GI/Hepatic Neg liver ROS,GERD  Medicated,,  Endo/Other  Obesity   Renal/GU negative Renal ROS     Musculoskeletal  (+) Arthritis ,  Fibromyalgia -  Abdominal   Peds  Hematology negative hematology ROS (+)    Anesthesia Other Findings   Reproductive/Obstetrics                              Anesthesia Physical Anesthesia Plan  ASA: 3  Anesthesia Plan: General   Post-op Pain Management: Tylenol  PO (pre-op)* and Toradol  IV (intra-op)*   Induction: Intravenous  PONV Risk Score and Plan: 3 and Dexamethasone  and Ondansetron   Airway Management Planned: LMA  Additional Equipment:   Intra-op Plan:   Post-operative Plan: Extubation in OR  Informed Consent: I have reviewed the patients History and Physical, chart, labs and discussed the procedure including the risks, benefits and alternatives for the proposed anesthesia with the patient or authorized representative who has indicated his/her understanding and acceptance.     Dental advisory given  Plan Discussed with: CRNA  Anesthesia Plan Comments: (PAT note written 11/27/2024 by Allison Zelenak, PA-C.  )         Anesthesia Quick Evaluation  "

## 2024-11-27 NOTE — Progress Notes (Signed)
 Anesthesia Chart Review: Dana Greer  Case: [id number] Date/Time: 11/28/24 0745   Procedure: CARPAL TUNNEL RELEASE (Right: Wrist)   Anesthesia type: Regional   Pre-op diagnosis: RIGHT CARPAL TUNNEL SYNDROME   [facility] ROOM 05 / [facility] OR   Surgeons: Murrell Drivers, MD       DISCUSSION: Patient is a 54 year old female scheduled for the above procedure.  History includes never smoker, CVA, HTN, CHF, GERD, immune deficiency disorder.  Preoperative cardiology evaluation 11/08/2023 with Gerard Frederick, NP. History of cardiomyopathy with an LVEF of 45-50% with mildly decreased function in 2023. At that time she had nuclear stress testing which was considered a low risk scan. She has NYHA class I-II symptoms. CCTA 11/08/2024 (for evaluation of atypical chest pain) showed CAC of 0, no evidence of CAD.  Anesthesia team to evaluate on the day of surgery.   VS: BP (!) 152/98   Pulse 73   Temp 37 C   Resp 17   Ht 5' 3 (1.6 m)   Wt 84 kg   LMP 10/16/2019 Comment: had hysterectomy-still has half of her uterus and ovaries  SpO2 100%   BMI 32.81 kg/m  She had not taken amlodipine  yet.   PROVIDERS: [name], [name], MD is PCP End, Lonni, MD is cardiologist   LABS: Labs reviewed: Acceptable for surgery. (all labs ordered are listed, but only abnormal results are displayed)  Labs Reviewed  BASIC METABOLIC PANEL WITH GFR - Abnormal; Notable for the following components:      Result Value   Glucose, Bld 155 (*)    All other components within normal limits  CBC     IMAGES: US  Thyroid  02/15/2024: IMPRESSION: 1. Mild diffuse thyroid  enlargement and heterogeneity. 2. 1.2 cm right superior thyroid  TR 3 nodule. No follow-up or biopsy recommended. - The above is in keeping with the ACR TI-RADS recommendations - J Am Coll Radiol 2017;14:587-595.    EKG: 11/07/2024: Normal sinus rhythm   CV: CTA Coronary 11/08/2024: IMPRESSION: 1. Total coronary calcium score is 0. 2. Normal  coronary origins with right dominance. 3. CAD-RADS 0 No evidence of CAD. 4. Minimal aortic atherosclerosis. RECOMMENDATION: Consider non-atherosclerotic causes of chest pain.   Nuclear stress test 10/13/2022:   The study is normal. The study is low risk.   No ST deviation was noted. TWI in leads III, aVF during stress   LV perfusion is normal. There is no evidence of ischemia. There is no evidence of infarction.   Left ventricular function is normal. Nuclear stress EF: 62 %. The left ventricular ejection fraction is normal (55-65%). End diastolic cavity size is normal. End systolic cavity size is normal.   Prior study not available for comparison.   1. Fixed apical inferior perfusion defect with normal wall motion, consistent with artifact 2. Low risk study   Echo (Limited) 10/06/2022: IMPRESSIONS   1. No apparent apical thrombus noted. Left ventricular ejection fraction,  by estimation, is 45 to 50%. The left ventricle has mildly decreased  function. The left ventricle demonstrates regional wall motion  abnormalities (The mid anteroseptal segment, apical lateral segment, apical inferior  segment, and apex are akinetic. The entire anterior wall, antero-lateral  wall, inferior septum, inferior wall, posterior wall, and basal  anteroseptal  segment are hypokinetic). Left ventricular diastolic parameters are indeterminate.   2. Right ventricular systolic function was not well visualized. The right  ventricular size is not well visualized.   3. The mitral valve is normal in structure. not assessed  mitral valve  regurgitation. Not assessed mitral stenosis.   4. The aortic valve was not well visualized. Aortic valve regurgitation  Not assessed. not assessed.  - Comparison(s): A prior study was performed on 05/06/2022. Wall motion  abnormailties are new.  - TTE 05/06/2022: LVEF 60-65%, no RWMA, grade 1 DD, normal RV systolic function, mild MR  Past Medical History:  Diagnosis Date    Arthritis    Depression    Fibromyalgia    GERD (gastroesophageal reflux disease)    Headache(784.0)    Hx of adenomatous polyp of colon 02/14/2020   Hypertension    Immune deficiency disorder    Migraine headache    Shingles    Stroke (HCC)    Thrombocytopenia 10/01/2012    Past Surgical History:  Procedure Laterality Date   ABDOMINAL HYSTERECTOMY     CARPAL TUNNEL RELEASE Left 09/25/2018   Procedure: LEFT CARPAL TUNNEL RELEASE;  Surgeon: Murrell Kuba, MD;  Location: [facility];  Service: Orthopedics;  Laterality: Left;   CESAREAN SECTION  2008   x1   PARTIAL HYSTERECTOMY  2012   due to fibroid   UPPER GASTROINTESTINAL ENDOSCOPY     WISDOM TOOTH EXTRACTION  1995    MEDICATIONS:  ALPRAZolam  (XANAX ) 1 MG tablet   amitriptyline  (ELAVIL ) 10 MG tablet   amitriptyline  (ELAVIL ) 25 MG tablet   amLODipine  (NORVASC ) 5 MG tablet   cloNIDine  (CATAPRES ) 0.2 MG tablet   cyclobenzaprine  (FLEXERIL ) 10 MG tablet   famotidine  (PEPCID ) 20 MG tablet   hyoscyamine  (LEVSIN  SL) 0.125 MG SL tablet   meclizine  (ANTIVERT ) 25 MG tablet   metoCLOPramide  (REGLAN ) 10 MG tablet   ondansetron  (ZOFRAN ) 8 MG tablet   oxybutynin (OXYTROL) 3.9 MG/24HR   pantoprazole  (PROTONIX ) 40 MG tablet   solifenacin (VESICARE) 10 MG tablet   valACYclovir  (VALTREX ) 1000 MG tablet   No current facility-administered medications for this encounter.    Isaiah Ruder, PA-C Surgical Short Stay/Anesthesiology [facility] Phone [phone] [facility] Phone [phone] 11/27/2024 12:09 PM

## 2024-11-28 ENCOUNTER — Encounter (HOSPITAL_COMMUNITY): Payer: Self-pay | Admitting: Orthopedic Surgery

## 2024-11-28 ENCOUNTER — Encounter: Payer: Self-pay | Admitting: Family Medicine

## 2024-11-28 ENCOUNTER — Ambulatory Visit (HOSPITAL_COMMUNITY)
Admission: RE | Admit: 2024-11-28 | Discharge: 2024-11-28 | Disposition: A | Discharge status: Home / Self Care | Attending: Orthopedic Surgery | Admitting: Orthopedic Surgery

## 2024-11-28 ENCOUNTER — Encounter (HOSPITAL_COMMUNITY)
Admission: RE | Disposition: A | Discharge status: Home / Self Care | Payer: Self-pay | Source: Home / Self Care | Attending: Orthopedic Surgery

## 2024-11-28 ENCOUNTER — Encounter (HOSPITAL_COMMUNITY): Admitting: Anesthesiology

## 2024-11-28 ENCOUNTER — Encounter (HOSPITAL_COMMUNITY): Admitting: Vascular Surgery

## 2024-11-28 DIAGNOSIS — G5601 Carpal tunnel syndrome, right upper limb: Secondary | ICD-10-CM | POA: Diagnosis present

## 2024-11-28 DIAGNOSIS — Z8673 Personal history of transient ischemic attack (TIA), and cerebral infarction without residual deficits: Secondary | ICD-10-CM | POA: Insufficient documentation

## 2024-11-28 DIAGNOSIS — F419 Anxiety disorder, unspecified: Secondary | ICD-10-CM | POA: Diagnosis not present

## 2024-11-28 DIAGNOSIS — F32A Depression, unspecified: Secondary | ICD-10-CM | POA: Diagnosis not present

## 2024-11-28 DIAGNOSIS — I10 Essential (primary) hypertension: Secondary | ICD-10-CM

## 2024-11-28 DIAGNOSIS — M797 Fibromyalgia: Secondary | ICD-10-CM | POA: Insufficient documentation

## 2024-11-28 MED ORDER — ONDANSETRON HCL 4 MG/2ML IJ SOLN
INTRAMUSCULAR | Status: DC | PRN
  Administered 2024-11-28: 4 mg via INTRAVENOUS

## 2024-11-28 MED ORDER — FENTANYL CITRATE (PF) 100 MCG/2ML IJ SOLN
INTRAMUSCULAR | Status: AC
  Filled 2024-11-28: qty 2

## 2024-11-28 MED ORDER — PROPOFOL 10 MG/ML IV BOLUS
INTRAVENOUS | Status: DC | PRN
  Administered 2024-11-28: 160 mg via INTRAVENOUS
  Administered 2024-11-28: 40 mg via INTRAVENOUS

## 2024-11-28 MED ORDER — PROPOFOL 10 MG/ML IV BOLUS
INTRAVENOUS | Status: AC
  Filled 2024-11-28: qty 20

## 2024-11-28 MED ORDER — ONDANSETRON HCL 4 MG/2ML IJ SOLN: 4.0000 mg | Freq: Once | INTRAMUSCULAR | Status: DC | PRN

## 2024-11-28 MED ORDER — DEXAMETHASONE SOD PHOSPHATE PF 10 MG/ML IJ SOLN
INTRAMUSCULAR | Status: DC | PRN
  Administered 2024-11-28: 10 mg via INTRAVENOUS

## 2024-11-28 MED ORDER — BUPIVACAINE HCL (PF) 0.25 % IJ SOLN
INTRAMUSCULAR | Status: AC
  Filled 2024-11-28: qty 30

## 2024-11-28 MED ORDER — CEFAZOLIN SODIUM-DEXTROSE 2-3 GM-%(50ML) IV SOLR
INTRAVENOUS | Status: DC | PRN
  Administered 2024-11-28: 2 g via INTRAVENOUS

## 2024-11-28 MED ORDER — STERILE WATER FOR IRRIGATION IR SOLN
Status: DC | PRN
  Administered 2024-11-28: 1000 mL

## 2024-11-28 MED ORDER — KETOROLAC TROMETHAMINE 30 MG/ML IJ SOLN
INTRAMUSCULAR | Status: DC | PRN
  Administered 2024-11-28: 30 mg via INTRAVENOUS

## 2024-11-28 MED ORDER — MIDAZOLAM HCL 2 MG/2ML IJ SOLN
INTRAMUSCULAR | Status: AC
  Filled 2024-11-28: qty 2

## 2024-11-28 MED ORDER — ACETAMINOPHEN 500 MG PO TABS
1000.0000 mg | ORAL_TABLET | Freq: Once | ORAL | Status: AC
  Administered 2024-11-28: 1000 mg via ORAL
  Filled 2024-11-28: qty 2

## 2024-11-28 MED ORDER — LIDOCAINE 2% (20 MG/ML) 5 ML SYRINGE
INTRAMUSCULAR | Status: DC | PRN
  Administered 2024-11-28: 60 mg via INTRAVENOUS

## 2024-11-28 MED ORDER — FENTANYL CITRATE (PF) 100 MCG/2ML IJ SOLN
INTRAMUSCULAR | Status: DC | PRN
  Administered 2024-11-28: 50 ug via INTRAVENOUS

## 2024-11-28 MED ORDER — LACTATED RINGERS IV SOLN: INTRAVENOUS | Status: DC

## 2024-11-28 MED ORDER — FENTANYL CITRATE (PF) 100 MCG/2ML IJ SOLN: 25.0000 ug | INTRAMUSCULAR | Status: DC | PRN

## 2024-11-28 MED ORDER — 0.9 % SODIUM CHLORIDE (POUR BTL) OPTIME
TOPICAL | Status: DC | PRN
  Administered 2024-11-28: 1000 mL

## 2024-11-28 MED ORDER — BUPIVACAINE HCL (PF) 0.25 % IJ SOLN
INTRAMUSCULAR | Status: DC | PRN
  Administered 2024-11-28: 9 mL

## 2024-11-28 MED ORDER — CHLORHEXIDINE GLUCONATE 0.12 % MT SOLN
15.0000 mL | Freq: Once | OROMUCOSAL | Status: AC
  Administered 2024-11-28: 15 mL via OROMUCOSAL
  Filled 2024-11-28: qty 15

## 2024-11-28 MED ORDER — HYDROCODONE-ACETAMINOPHEN 5-325 MG PO TABS: 1.0000 | ORAL_TABLET | Freq: Four times a day (QID) | ORAL | 0 refills | Status: AC | PRN

## 2024-11-28 MED ORDER — ORAL CARE MOUTH RINSE: 15.0000 mL | Freq: Once | OROMUCOSAL | Status: AC

## 2024-11-28 MED ORDER — MIDAZOLAM HCL (PF) 2 MG/2ML IJ SOLN
INTRAMUSCULAR | Status: DC | PRN
  Administered 2024-11-28: 2 mg via INTRAVENOUS

## 2024-11-28 NOTE — Op Note (Signed)
 11/28/2024 [facility] OR                              OPERATIVE REPORT   PREOPERATIVE DIAGNOSIS:  Right carpal tunnel syndrome  POSTOPERATIVE DIAGNOSIS:  Right carpal tunnel syndrome  PROCEDURE:  Right carpal tunnel release  SURGEON:  Franky Curia, MD  ASSISTANT:  Jesse Jordan, [facility]  ANESTHESIA: General  IV FLUIDS:  Per anesthesia flow sheet  ESTIMATED BLOOD LOSS:  Minimal  COMPLICATIONS:  None  SPECIMENS:  None  TOURNIQUET TIME:   Total Tourniquet Time Documented: Upper Arm (Right) - 14 minutes Total: Upper Arm (Right) - 14 minutes   DISPOSITION:  Stable to PACU  LOCATION: [facility] OR  INDICATIONS:  54 y.o. yo female with numbness and tingling right hand.  Nocturnal symptoms. Positive nerve conduction studies. She wishes to proceed with right carpal tunnel release.  Risks, benefits and alternatives of surgery were discussed including the risk of blood loss; infection; damage to nerves, vessels, tendons, ligaments, bone; failure of surgery; need for additional surgery; complications with wound healing; continued pain; recurrence of carpal tunnel syndrome; and damage to motor branch. She voiced understanding of these risks and elected to proceed.   OPERATIVE COURSE:  After being identified preoperatively by myself, the patient and I agreed upon the procedure and site of procedure.  The surgical site was marked.  Surgical consent had been signed.  She was given IV Ancef  as preoperative antibiotic prophylaxis.  She was transferred to the operating room and placed on the operating room table in supine position with the Right upper extremity on an armboard.  General anesthesia was induced by the anesthesiologist.  Right upper extremity was prepped and draped in normal sterile orthopaedic fashion.  A surgical pause was performed between the surgeons, anesthesia, and operating room staff, and all were in agreement as to the patient, procedure, and site of procedure.  Tourniquet at the proximal aspect  of the extremity was inflated to 250 mmHg after exsanguination of the arm with an Esmarch bandage  Incision was made over the transverse carpal ligament and carried into the subcutaneous tissues by spreading technique.  Bipolar electrocautery was used to obtain hemostasis.  The palmar fascia was sharply incised.  The transverse carpal ligament was identified.  The fascia distal to the ligament was opened.  Retractor was placed and the flexor tendons were identified.  The flexor tendon to the ring finger was identified and retracted radially.  The transverse carpal ligament was then incised from distal to proximal under direct visualization.  Scissors were used to split the distal aspect of the volar antebrachial fascia.  A finger was placed into the wound to ensure complete decompression, which was the case.  The nerve was examined.  It was flattened and hyperemic.  The motor branch was identified and was intact.  The wound was copiously irrigated with sterile saline.  It was then closed with 4-0 nylon in a horizontal mattress fashion.  It was injected with 0.25% plain Marcaine  to aid in postoperative analgesia.  It was dressed with sterile Xeroform, 4x4s, an ABD, and wrapped with Kerlix and an Ace bandage.  Tourniquet was deflated at 14 minutes.  Fingertips were pink with brisk capillary refill after deflation of the tourniquet.  Operative drapes were broken down.  The patient was awoken from anesthesia safely.  She was transferred back to stretcher and taken to the PACU in stable condition.  I will see  her back in the office in 1 week for postoperative followup.  I will give her a prescription for Norco 5/325 1 tab PO q6 hours prn pain, dispense #15.    Sephiroth Mcluckie, MD Electronically signed, 11/28/24

## 2024-11-28 NOTE — Anesthesia Procedure Notes (Signed)
 Procedure Name: LMA Insertion Date/Time: 11/28/2024 8:40 AM  Performed by: Mollie Olivia SAUNDERS, CRNAPre-anesthesia Checklist: Patient identified, Emergency Drugs available, Suction available and Patient being monitored Patient Re-evaluated:Patient Re-evaluated prior to induction Oxygen Delivery Method: Circle System Utilized Preoxygenation: Pre-oxygenation with 100% oxygen Induction Type: IV induction Ventilation: Mask ventilation without difficulty LMA: LMA inserted LMA Size: 4.0 Number of attempts: 1 Placement Confirmation: positive ETCO2 Tube secured with: Tape Dental Injury: Teeth and Oropharynx as per pre-operative assessment

## 2024-11-28 NOTE — Anesthesia Postprocedure Evaluation (Signed)
"   Anesthesia Post Note  Patient: Merelin D King-Goins  Procedure(s) Performed: RIGHT CARPAL TUNNEL RELEASE (Right: Wrist)     Patient location during evaluation: PACU Anesthesia Type: General Level of consciousness: awake and alert Pain management: pain level controlled Vital Signs Assessment: post-procedure vital signs reviewed and stable Respiratory status: spontaneous breathing, nonlabored ventilation, respiratory function stable and patient connected to nasal cannula oxygen Cardiovascular status: blood pressure returned to baseline and stable Postop Assessment: no apparent nausea or vomiting Anesthetic complications: no   No notable events documented.  Last Vitals:  Vitals:   11/28/24 0930 11/28/24 0945  BP: 114/77 128/78  Pulse: 94 92  Resp: 11 20  Temp:  36.7 C  SpO2: 92% 95%    Last Pain:  Vitals:   11/28/24 0945  TempSrc:   PainSc: 0-No pain                 Garnette FORBES Skillern      "

## 2024-11-28 NOTE — Transfer of Care (Signed)
 Immediate Anesthesia Transfer of Care Note  Patient: Dana Greer  Procedure(s) Performed: RIGHT CARPAL TUNNEL RELEASE (Right: Wrist)  Patient Location: PACU  Anesthesia Type:General  Level of Consciousness: sedated, drowsy, and responds to stimulation  Airway & Oxygen Therapy: Patient Spontanous Breathing and Patient connected to face mask oxygen  Post-op Assessment: Report given to RN and Post -op Vital signs reviewed and stable  Post vital signs: Reviewed and stable  Last Vitals:  Vitals Value Taken Time  BP 121/75 11/28/24 09:16  Temp    Pulse 73 11/28/24 09:21  Resp 17 11/28/24 09:21  SpO2 100 % 11/28/24 09:21  Vitals shown include unfiled device data.  Last Pain:  Vitals:   11/28/24 0659  TempSrc:   PainSc: 0-No pain         Complications: No notable events documented.

## 2024-11-28 NOTE — H&P (Signed)
 Dana Greer is an 54 y.o. female.   Chief Complaint: carpal tunnel syndrome HPI: 54 y.o. yo female with numbness and tingling right hand.  Nocturnal symptoms. Positive nerve conduction studies. She wishes to have right carpal tunnel release.   Allergies: Allergies[1]  Past Medical History:  Diagnosis Date   Arthritis    Depression    Fibromyalgia    GERD (gastroesophageal reflux disease)    Headache(784.0)    Hx of adenomatous polyp of colon 02/14/2020   Hypertension    Immune deficiency disorder    Migraine headache    Shingles    Stroke [facility])    Thrombocytopenia 10/01/2012    Past Surgical History:  Procedure Laterality Date   ABDOMINAL HYSTERECTOMY     CARPAL TUNNEL RELEASE Left 09/25/2018   Procedure: LEFT CARPAL TUNNEL RELEASE;  Surgeon: Murrell Kuba, MD;  Location: [facility];  Service: Orthopedics;  Laterality: Left;   CESAREAN SECTION  2008   x1   PARTIAL HYSTERECTOMY  2012   due to fibroid   UPPER GASTROINTESTINAL ENDOSCOPY     WISDOM TOOTH EXTRACTION  1995    Family History: Family History  Problem Relation Age of Onset   Healthy Mother    Diabetes Father    COPD Father    Breast cancer Paternal Aunt    Lung cancer Paternal Aunt    Lung cancer Paternal Uncle    Breast cancer Paternal Grandmother    Colon cancer Paternal Grandmother    Rectal cancer Paternal Grandmother    Esophageal cancer Cousin    Stomach cancer Neg Hx     Social History:   reports that she has never smoked. She has never used smokeless tobacco. She reports current alcohol use. She reports current drug use. Drug: Marijuana.  Medications: Medications Prior to Admission  Medication Sig Dispense Refill   amitriptyline  (ELAVIL ) 10 MG tablet TAKE ONE TABLET BY MOUTH EVERY MORNING (Patient taking differently: Take 20 mg by mouth every morning.) 90 tablet 1   amitriptyline  (ELAVIL ) 25 MG tablet Take 2 tablets (50 mg total) by mouth daily. (Patient taking  differently: Take 50 mg by mouth at bedtime.) 60 tablet 0   amLODipine  (NORVASC ) 5 MG tablet Take 5 mg by mouth daily.     cloNIDine  (CATAPRES ) 0.2 MG tablet Take 0.2 mg by mouth 2 (two) times daily.     solifenacin (VESICARE) 10 MG tablet Take 10 mg by mouth daily.     ALPRAZolam  (XANAX ) 1 MG tablet Take 1 mg by mouth 4 (four) times daily as needed for anxiety or sleep.  (Patient not taking: Reported on 11/21/2024)     cyclobenzaprine  (FLEXERIL ) 10 MG tablet Take 1 tablet (10 mg total) by mouth at bedtime as needed for muscle spasms. (Patient not taking: Reported on 11/21/2024) 30 tablet 3   famotidine  (PEPCID ) 20 MG tablet Take 1 tablet (20 mg total) by mouth 2 (two) times daily. (Patient not taking: Reported on 11/21/2024) 180 tablet 3   hyoscyamine  (LEVSIN  SL) 0.125 MG SL tablet Place 1 tablet (0.125 mg total) under the tongue every 4 (four) hours as needed for cramping. (Patient not taking: Reported on 11/21/2024) 15 tablet 0   meclizine  (ANTIVERT ) 25 MG tablet Take 50 mg by mouth as needed for dizziness. (Patient not taking: Reported on 11/21/2024)     metoCLOPramide  (REGLAN ) 10 MG tablet TAKE ONE TABLET BY MOUTH EVERY 6 HOURS AS NEEDED FOR NAUSEA OR VOMITING (Patient not taking: Reported on 11/21/2024) 60  tablet 0   ondansetron  (ZOFRAN ) 8 MG tablet Take 1 tablet (8 mg total) by mouth 3 (three) times daily. (Patient not taking: Reported on 11/21/2024) 30 tablet 3   oxybutynin (OXYTROL) 3.9 MG/24HR Place 1 patch onto the skin every 3 (three) days. (Patient not taking: Reported on 11/21/2024)     pantoprazole  (PROTONIX ) 40 MG tablet Take 1 tablet (40 mg total) by mouth daily. (Patient not taking: Reported on 11/21/2024) 90 tablet 2   valACYclovir  (VALTREX ) 1000 MG tablet Take 1 tablet (1,000 mg total) by mouth 3 (three) times daily. (Patient not taking: Reported on 11/21/2024) 21 tablet 0    Results for orders placed or performed during the hospital encounter of 11/26/24 (from the past 48 hours)  CBC per  protocol     Status: None   Collection Time: 11/26/24  2:20 PM  Result Value Ref Range   WBC 5.0 4.0 - 10.5 K/uL   RBC 3.98 3.87 - 5.11 MIL/uL   Hemoglobin 13.3 12.0 - 15.0 g/dL   HCT 61.2 63.9 - 53.9 %   MCV 97.2 80.0 - 100.0 fL   MCH 33.4 26.0 - 34.0 pg   MCHC 34.4 30.0 - 36.0 g/dL   RDW 86.5 88.4 - 84.4 %   Platelets 156 150 - 400 K/uL   nRBC 0.0 0.0 - 0.2 %    Comment: Performed at [facility], [address] [place], [place] [address]  Basic metabolic panel per protocol     Status: Abnormal   Collection Time: 11/26/24  2:20 PM  Result Value Ref Range   Sodium 139 135 - 145 mmol/L   Potassium 4.3 3.5 - 5.1 mmol/L   Chloride 103 98 - 111 mmol/L   CO2 30 22 - 32 mmol/L   Glucose, Bld 155 (H) 70 - 99 mg/dL    Comment: Glucose reference range applies only to samples taken after fasting for at least 8 hours.   BUN 11 6 - 20 mg/dL   Creatinine, Ser 9.00 0.44 - 1.00 mg/dL   Calcium 9.2 8.9 - 89.6 mg/dL   GFR, Estimated >39 >39 mL/min    Comment: (NOTE) Calculated using the CKD-EPI Creatinine Equation (2021)    Anion gap 6 5 - 15    Comment: Performed at [facility], [address], [place], [place] [address]    No results found.    Blood pressure 127/89, pulse 84, temperature 98.1 F (36.7 C), temperature source Oral, resp. rate 20, height 5' 3 (1.6 m), weight 84 kg, last menstrual period 10/16/2019, SpO2 100%.  General appearance: alert, cooperative, and appears stated age Head: Normocephalic, without obvious abnormality, atraumatic Neck: supple, symmetrical, trachea midline Extremities: Intact capillary refill all digits.  +epl/fpl/io.  No wounds.  Skin: Skin color, texture, turgor normal. No rashes or lesions Neurologic: Grossly normal Incision/Wound: none  Assessment/Plan Right carpal tunnel syndrome.  Non operative and operative treatment options have been discussed with the patient and patient wishes to proceed with operative treatment. Risks,  benefits, and alternatives of surgery have been discussed and the patient agrees with the plan of care.   S[place] Moss 11/28/2024, 8:28 AM      [1]  Allergies Allergen Reactions   Trazodone And Nefazodone Nausea And Vomiting   Amoxicillin  Other (See Comments)    Yeast infection    Paxil  [Paroxetine ]     Vomiting    Trazodone Nausea And Vomiting   Carafate  [Sucralfate ] Nausea And Vomiting

## 2024-11-28 NOTE — Discharge Instructions (Signed)

## 2024-11-29 ENCOUNTER — Encounter (HOSPITAL_COMMUNITY): Payer: Self-pay | Admitting: Orthopedic Surgery
